# Patient Record
Sex: Male | Born: 1955 | Hispanic: No | Marital: Married | Smoking: Current every day smoker
Health system: Southern US, Community
[De-identification: ages and names within clinical notes are randomized; demographics above are authoritative.]

## PROBLEM LIST (undated history)

## (undated) DIAGNOSIS — I739 Peripheral vascular disease, unspecified: Secondary | ICD-10-CM

## (undated) DIAGNOSIS — Z9981 Dependence on supplemental oxygen: Secondary | ICD-10-CM

## (undated) DIAGNOSIS — R0600 Dyspnea, unspecified: Secondary | ICD-10-CM

## (undated) DIAGNOSIS — J449 Chronic obstructive pulmonary disease, unspecified: Secondary | ICD-10-CM

## (undated) DIAGNOSIS — J939 Pneumothorax, unspecified: Secondary | ICD-10-CM

## (undated) DIAGNOSIS — R634 Abnormal weight loss: Secondary | ICD-10-CM

## (undated) DIAGNOSIS — C169 Malignant neoplasm of stomach, unspecified: Secondary | ICD-10-CM

## (undated) DIAGNOSIS — Z720 Tobacco use: Secondary | ICD-10-CM

## (undated) HISTORY — PX: ESOPHAGOGASTRODUODENOSCOPY: SHX1529

## (undated) HISTORY — DX: Malignant neoplasm of stomach, unspecified: C16.9

## (undated) HISTORY — DX: Pneumothorax, unspecified: J93.9

## (undated) NOTE — *Deleted (*Deleted)
[facility]   [phone] [phone]   Clinic Follow up Note   Patient Care Team: Malachy Mood, MD as PCP - General (Hematology) Michele Mcalpine, MD as Consulting Physician (Pulmonary Disease) Rachael Fee, MD as Attending Physician (Gastroenterology) Almond Lint, MD as Consulting Physician (General Surgery) Tomma Lightning, MD as Consulting Physician (Pulmonary Disease)  Date of Service:  05/28/2020  CHIEF COMPLAINT: F/u on gastric adenocarcinoma  SUMMARY OF ONCOLOGIC HISTORY: Oncology History Overview Note  Cancer Staging Gastric cancer [facility]) Staging form: Stomach, AJCC 8th Edition - Clinical stage from 05/24/2017: Stage I (cT2, cN0, cM0) - Signed by Malachy Mood, MD on 06/19/2017 - Pathologic stage from 11/27/2017: Stage I (ypT1b, pN0, cM0) - Signed by Malachy Mood, MD on 04/04/2018     Gastric adenocarcinoma (HCC)  05/24/2017 Procedure   EGD by Dr. Christella Hartigan 05/24/17 IMPRESSION Ulcerated mass like region in the distal stomach (4cm across), extensively biopsied. This appears neoplastic. - Abnormal mucosa throughout stomach otherwise (mild-moderate gastritis), also extensively biopsied.   05/24/2017 Initial Biopsy   Diagnosis 05/24/17  1. Stomach, biopsy, mass distal - ADENOCARCINOMA, SEE COMMENT. - CHRONIC ACTIVE GASTRITIS WITH INTESTINAL METAPLASIA. 2. 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 - NO DYSPLASIA OR MALIGNANCY   06/04/2017 Initial Diagnosis   Gastric cancer (HCC)   06/07/2017 Procedure   EUS by Dr. Christella Hartigan on 06/07/17  IMPRESSION:  5cm across, partially circumferential uT2N0 gastric adenocarcinoma laying in the distal stomach 3-4cm proximal to the pylorus.   06/22/2017 Imaging   CT CAP W Contrast 06/22/17  IMPRESSION: 1. The gastric mass is somewhat inconspicuous on CT, but a potential 1.2 cm thick mucosal masses seen along the posterior  gastric border on image 83/2. There several small adjacent right gastric lymph nodes measuring up to 0.7 cm in short axis, but no overtly pathologic adenopathy and no findings of hepatic or peritoneal metastatic disease at this time. 2. Calcified granulomas in both lungs along with several noncalcified nodules stable from August 2016 and likely benign given this long-term stability. 3. Other imaging findings of potential clinical significance: Aortic Atherosclerosis (ICD10-I70.0) and Emphysema (ICD10-J43.9). Airway thickening is present, suggesting bronchitis or reactive airways disease. 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 Disc bulges at L3-4 and L4-5.   07/05/2017 Surgery   PAC placement    07/07/2017 - 09/06/2017 Chemotherapy   Neoadjuvant FOLFOX with with leucovorin every 2 weeks. Plan for 6 cycles but stopped after 4 cycles due to poor tolerance     09/10/2017 Imaging    CT AP W Contrast 09/10/17 IMPRESSION: 1. No acute findings identified within the abdomen or pelvis. 2. Interval development of multiple bilateral pulmonary densities within both lower lobes. These are indeterminate, likely post infectious or inflammatory in etiology. Atypical infection not excluded. Metastatic disease is considered less favored but not entirely excluded and follow-up imaging to ensure resolution is advised. 3. No evidence to suggest recurrent tumor or metastatic disease within the abdomen or pelvis. 4.  Aortic Atherosclerosis (ICD10-I70.0).   11/27/2017 Surgery    Surgery He had partial gastrectomy with Dr. Donell Beers on 11/27/2017.   11/27/2017 Pathology Results   11/27/2017 Surgical Pathology Diagnosis 1. Stomach, resection for tumor, Distal w/ Omentum - ADENOCARCINOMA, MODERATELY DIFFERENTIATED, 3.8 CM - LYMPHOVASCULAR SPACE AND PERINEURAL INVASION - NO CARCINOMA IDENTIFIED IN FOUR LYMPH NODES (0/4) - SEE  ONCOLOGY TABLE AND COMMENT BELOW 2.  Lymph node, biopsy, Portal - NO CARCINOMA IDENTIFIED IN ONE LYMPH NODE (0/1) - SEE COMMENT 3. Lymph node, biopsy, Left Gastric - NO CARCINOMA IDENTIFIED IN TWO LYMPH NODES (0/2) - SEE COMMENT 4. Omentum, resection for tumor - BENIGN FIBROADIPOSE TISSUE - NO CARCINOMA IDENTIFIED    11/27/2017 Cancer Staging   Staging form: Stomach, AJCC 8th Edition - Pathologic stage from 11/27/2017: Stage I (ypT1b, pN0, cM0) - Signed by Malachy Mood, MD on 04/04/2018   08/14/2018 Imaging   CT CAP 08/14/18 IMPRESSION:  1. No findings of active malignancy. 2. Generally stable pulmonary nodules are identified in the lungs. The only new nodule seen today is a 3 mm right apical nodule on image 32/4 which may merit surveillance. Many of the basilar nodules shown on the prior CT abdomen from 09/10/2017 have resolved. 3. Other imaging findings of potential clinical significance: Aortic Atherosclerosis (ICD10-I70.0). Emphysema (ICD10-J43.9). Lumbar degenerative disc disease.   08/22/2018 Procedure   Upper endoscopy 08/22/18 by Dr Fritz Pickerel The esophagus was slightly tortuous but there were no strictures/stenosis and the mucosa was normal throughout. The gastro-jejunostomy site was widely patent, Bilroth II anatomy. There was a 1.5-2cm region of the anastomosis that was discretely abnormal, soft but somewhat neoplastic appearing and nodular. I biopsied this region extensively. Diagnosis Stomach, biopsy, GJ anastomosis erythema - LOW GRADE GLANDULAR DYSPLASIA. - GOBLET CELL METAPLASIA. - SEE COMMENT.   11/28/2018 Procedure   Upper Endoscopy 11/28/18 by Dr Fritz Pickerel - Bilroth II anatomy. The previously noted adenomatous polyp was again located at the GJ anastomosis. It was soft, sessile, about 1.9cm across in greatest dimension. I used mixed cold/cautery snare resection to (apparently) completely remove the polyp. Diagnosis Stomach, polyp(s) - LOW GRADE  GLANDULAR DYSPLASIA. - GOBLET CELL METAPLASIA.   07/31/2019 Procedure   Upper Endoscopy by Dr. Christella Hartigan 07/31/19  IMPRESSION - Typical Bilroth II anatomy. The GJ anastomosis was slightly inflammed, but not overtly neoplastic appearing. No clear recurrent or residual polyps. The anastomosis was sampled at the sight of most significant appearing inflammation (1-2cm section, see images). - Afferent and efferent limbs were intubated briefly and appeared normal. - Mild candida esophagitis, not sampled. Will start empiric diflucan course (100mg  daily for 10 days) - The examination was otherwise normal. FINAL MICROSCOPIC DIAGNOSIS:   A.  IRREGULAR MUCOSA AT GJ JUNCTION, BIOPSY:  -Anastomosis site with peptic change and low grade dysplasia.    04/01/2020 Imaging   CT CAP IMPRESSION: 1. Redemonstrated postoperative findings of distal gastrectomy and gastric jejunostomy. No evidence of malignant recurrence. 2. There is incidental note of intussusception of the jejunum into the gastric lumen, likely transient and peristaltic in nature. 3. Stable small pulmonary nodules scattered throughout the lungs. Attention on follow-up. 4. Additional scattered, clustered centrilobular and tree-in-bud nodules in the dependent right lower lobe, most consistent with aspiration given the presence of frothy debris in the airways. 5. Severe emphysema and diffuse bilateral bronchial wall thickening. Emphysema (ICD10-J43.9). 6. Coronary artery disease. 7. 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 Aortic Atherosclerosis (ICD10-I70.0).      CURRENT THERAPY:  Surveillance  INTERVAL HISTORY: *** Bobby Caldwell is here for a follow up of gastric cancer. He presents to the clinic alone.    REVIEW OF SYSTEMS:  *** Constitutional: Denies fevers, chills or abnormal weight loss Eyes: Denies  blurriness of vision Ears, nose, mouth, throat, and face: Denies mucositis or  sore throat Respiratory: Denies cough, dyspnea or wheezes Cardiovascular: Denies palpitation, chest discomfort or lower extremity swelling Gastrointestinal:  Denies nausea, heartburn or change in bowel habits Skin: Denies abnormal skin rashes Lymphatics: Denies new lymphadenopathy or easy bruising Neurological:Denies numbness, tingling or new weaknesses Behavioral/Psych: Mood is stable, no new changes  All other systems were reviewed with the patient and are negative.  MEDICAL HISTORY:  Past Medical History:  Diagnosis Date  . COPD (chronic obstructive pulmonary disease) (HCC)   . Dyspnea   . PAD (peripheral artery disease) (HCC)   . Stomach cancer (HCC)   . Tobacco abuse   . Weight loss     SURGICAL HISTORY: Past Surgical History:  Procedure Laterality Date  . ABDOMINAL AORTOGRAM W/LOWER EXTREMITY N/A 05/03/2017   Procedure: ABDOMINAL AORTOGRAM W/LOWER EXTREMITY;  Surgeon: Fransisco Hertz, MD;  Location: [facility] INVASIVE CV LAB;  Service: Cardiovascular;  Laterality: N/A;  . BIOPSY  08/22/2018   Procedure: BIOPSY;  Surgeon: Rachael Fee, MD;  Location: [facility] ENDOSCOPY;  Service: Endoscopy;;  . BIOPSY  07/31/2019   Procedure: BIOPSY;  Surgeon: Rachael Fee, MD;  Location: [facility] ENDOSCOPY;  Service: Endoscopy;;  . ESOPHAGOGASTRODUODENOSCOPY     05/24/17  . ESOPHAGOGASTRODUODENOSCOPY (EGD) WITH PROPOFOL N/A 05/24/2017   Procedure: ESOPHAGOGASTRODUODENOSCOPY (EGD) WITH PROPOFOL;  Surgeon: Rachael Fee, MD;  Location: [facility]OSCOPY;  Service: Endoscopy;  Laterality: N/A;  . ESOPHAGOGASTRODUODENOSCOPY (EGD) WITH PROPOFOL N/A 08/22/2018   Procedure: ESOPHAGOGASTRODUODENOSCOPY (EGD) WITH PROPOFOL;  Surgeon: Rachael Fee, MD;  Location: [facility]OSCOPY;  Service: Endoscopy;  Laterality: N/A;  . ESOPHAGOGASTRODUODENOSCOPY (EGD) WITH PROPOFOL N/A 11/28/2018   Procedure: ESOPHAGOGASTRODUODENOSCOPY (EGD) WITH PROPOFOL;   Surgeon: Rachael Fee, MD;  Location: [facility];  Service: Endoscopy;  Laterality: N/A;  . ESOPHAGOGASTRODUODENOSCOPY (EGD) WITH PROPOFOL N/A 07/31/2019   Procedure: ESOPHAGOGASTRODUODENOSCOPY (EGD) WITH PROPOFOL;  Surgeon: Rachael Fee, MD;  Location: [facility];  Service: Endoscopy;  Laterality: N/A;  . EUS N/A 06/07/2017   Procedure: UPPER ENDOSCOPIC ULTRASOUND (EUS) RADIAL;  Surgeon: Rachael Fee, MD;  Location: [facility]OS[facility];  Service: Endoscopy;  Laterality: N/A;  . FEMORAL-FEMORAL BYPASS GRAFT Bilateral 05/08/2017   Procedure: LEFT TO RIGHT BYPASS GRAFT FEMORAL-FEMORAL ARTERY;  Surgeon: Fransisco Hertz, MD;  Location: [facility] OR;  Service: Vascular;  Laterality: Bilateral;  . IR FLUORO GUIDE PORT INSERTION RIGHT  07/05/2017  . IR US GUIDE VASC ACCESS RIGHT  07/05/2017  . LAPAROSCOPIC INSERTION GASTROSTOMY TUBE N/A 11/27/2017   Procedure: JEJUNOSTOMY TUBE PLACEMENT;  Surgeon: Almond Lint, MD;  Location: [facility] OR;  Service: General;  Laterality: N/A;  . LAPAROSCOPIC PARTIAL GASTRECTOMY N/A 11/27/2017   Procedure: PARTIAL GASTRECTOMY;  Surgeon: Almond Lint, MD;  Location: [facility] OR;  Service: General;  Laterality: N/A;  . LAPAROSCOPY N/A 11/27/2017   Procedure: LAPAROSCOPY DIAGNOSTIC;  Surgeon: Almond Lint, MD;  Location: [facility] OR;  Service: General;  Laterality: N/A;  . NM MYOVIEW LTD  06/2017   LOW RISK. Small basal inferoseptal defect thought to be related to artifact (although cannot exclude prior infarct). Normal wall motion i in this area would suggest artifact not infarct. Otherwise no ischemia or infarction.  Marland Kitchen PERIPHERAL VASCULAR INTERVENTION Left 05/03/2017   Procedure: PERIPHERAL VASCULAR INTERVENTION;  Surgeon: Fransisco Hertz, MD;  Location: [facility] INVASIVE CV LAB;  Service: Cardiovascular;  Laterality: Left;  common iliac  . POLYPECTOMY  11/28/2018   Procedure: POLYPECTOMY;  Surgeon: Rachael Fee, MD;  Location: [facility]COPY;  Service: Endoscopy;;  . TRANSTHORACIC ECHOCARDIOGRAM  06/26/2017   Normal LV size and function. EF 60-65%. No wall motion abnormalities. GR 1 DD. Mild LA dilation    I have reviewed the social history and family history with the patient and they are unchanged from previous note.  ALLERGIES:  has No Known Allergies.  MEDICATIONS:  Current Outpatient Medications  Medication Sig Dispense Refill  . Budeson-Glycopyrrol-Formoterol (BREZTRI AEROSPHERE) 160-9-4.8 MCG/ACT AERO Inhale 2 puffs into the lungs 2 (two) times daily. 5.9 g 0  . budesonide-formoterol (SYMBICORT) 160-4.5 MCG/ACT inhaler Inhale 2 puffs into the lungs 2 (two) times daily. 6 g 5  . feeding supplement, ENSURE ENLIVE, (ENSURE ENLIVE) LIQD Take 237 mLs by mouth 2 (two) times daily between meals. 14222 mL 0  . gabapentin (NEURONTIN) 300 MG/6ML solution Take 6 mLs (300 mg total) by mouth 2 (two) times daily. 470 mL 2  . ipratropium-albuterol (DUONEB) 0.5-2.5 (3) MG/3ML SOLN Inhale 3 mLs into the lungs 4 (four) times daily. Dx:J44.9 360 mL 5  . megestrol (MEGACE ES) 625 MG/5ML suspension Take 5 mLs (625 mg total) by mouth daily. 150 mL 0  . mirtazapine (REMERON) 7.5 MG tablet Take 1 tablet (7.5 mg total) by mouth at bedtime. 30 tablet 2  . Multiple Vitamin (MULTIVITAMIN WITH MINERALS) TABS tablet Take 1 tablet by mouth daily. 30 tablet 0  . ondansetron (ZOFRAN) 4 MG tablet Take 1 tablet (4 mg total) by mouth every 8 (eight) hours as needed for nausea or vomiting. 15 tablet 0  . oxyCODONE-acetaminophen (PERCOCET) 7.5-325 MG tablet Take 1 tablet by mouth every 6 (six) hours as needed for severe pain. 30 tablet 0  . OXYGEN Inhale 2 L into the lungs continuous.    . Tiotropium Bromide Monohydrate (SPIRIVA RESPIMAT) 2.5 MCG/ACT AERS Inhale 2 puffs into the lungs daily. 4 g 5  . traMADol (ULTRAM) 50 MG tablet Take 1 tablet (50 mg total) by mouth every 12 (twelve) hours as needed. 30 tablet 0   No current facility-administered medications for this visit.    PHYSICAL EXAMINATION: ECOG  PERFORMANCE STATUS: {CHL ONC ECOG [phone]}  There were no vitals filed for this visit. There were no vitals filed for this visit. *** GENERAL:alert, no distress and comfortable SKIN: skin color, texture, turgor are normal, no rashes or significant lesions EYES: normal, Conjunctiva are pink and non-injected, sclera clear {OROPHARYNX:no exudate, no erythema and lips, buccal mucosa, and tongue normal}  NECK: supple, thyroid normal size, non-tender, without nodularity LYMPH:  no palpable lymphadenopathy in the cervical, axillary {or inguinal} LUNGS: clear to auscultation and percussion with normal breathing effort HEART: regular rate & rhythm and no murmurs and no lower extremity edema ABDOMEN:abdomen soft, non-tender and normal bowel sounds Musculoskeletal:no cyanosis of digits and no clubbing  NEURO: alert & oriented x 3 with fluent speech, no focal motor/sensory deficits  LABORATORY DATA:  I have reviewed the data as listed CBC Latest Ref Rng & Units 03/23/2020 11/18/2019 10/15/2019  WBC 4.0 - 10.5 K/uL 7.8 7.0 7.4  Hemoglobin 13.0 - 17.0 g/dL 12.4(L) 13.7 12.3(L)  Hematocrit 39 - 52 % 37.9(L) 44.1 38.7(L)  Platelets 150 - 400 K/uL 207 252 235     CMP Latest Ref Rng & Units 03/23/2020 11/18/2019 10/15/2019  Glucose 70 - 99 mg/dL 80 83 161(W)  BUN 8 - 23 mg/dL 6(L) 5(L) 12  Creatinine 0.61 - 1.24 mg/dL 9.60(A) 5.40 9.81(X)  Sodium 135 - 145 mmol/L 139 134(L) 140  Potassium 3.5 - 5.1 mmol/L 3.6 3.9 4.3  Chloride 98 -  111 mmol/L 98 93(L) 94(L)  CO2 22 - 32 mmol/L 34(H) 33(H) 39(H)  Calcium 8.9 - 10.3 mg/dL 1.6(X) 0.9(U) 8.1(L)  Total Protein 6.5 - 8.1 g/dL 6.0(L) - -  Total Bilirubin 0.3 - 1.2 mg/dL 0.5 - -  Alkaline Phos 38 - 126 U/L 80 - -  AST 15 - 41 U/L 13(L) - -  ALT 0 - 44 U/L 11 - -      RADIOGRAPHIC STUDIES: I have personally reviewed the radiological images as listed and agreed with the findings in the report. No results found.   ASSESSMENT & PLAN:  Bobby Caldwell  is a 72 y.o. male with   1. Gastric Adenocarcinoma, uT2N0M0, stage I, ypT1bN0 -Diagnosed in 05/2017. Treated withneoadjuvantchemo and surgery. Currently on surveillance. -His Upper Endoscopy with Dr Christella Hartigan from 07/31/19 showsno evidence of recurrence, mild esophagitis, which was treated with fluconazole. -His last CT CAP form 2/8/21showed no evidence of recurrence. -He has developed upper abdominal pain, lost more than 10 pounds, very fatigued, not doing well clinically.  I will repeat CT chest, abdomen pelvis in the next few weeks to ruled out cancer recurrence -Lab reviewed, CBC and CMP unremarkable -Follow-up after CT scan   2. COPD, Smoking Cessation -I previously advised him to quit smoking. -he will f/u pulmonologist Dr. Wynona Neat tomorrow -he needs portable oxygen tank, my nurse will check with his oxygen supplier   3.Depression and or anxiety -Currently on Ativan as needed. Continue and monitor.  4.Low appetite and weight loss -Repeat endoscopies in February a and May 2020 show benign polyp at [facility] anastamosis. -He used megace, mirtazapine and Wellbutrin. Heresponds better tomegace(he uses as needed), he is not on any of these now -Due to his significant weight loss, insomnia, I will restart mirtazapine first.  5. COVID19 (+)  -He tested positive for COVID19 on 06/23/19.He was quite symptomatic, but did not require hospitalization. -recovered now  Plan -I called in mirtazapine 7.5 mg hs for him. refilled tramadol and zofran  -CT chest, abdomen pelvis with contrast in the next 1 to 2 weeks -Follow-up after CT scan.   No problem-specific Assessment & Plan notes found for this encounter.   No orders of the defined types were placed in this encounter.  All questions were answered. The patient knows to call the clinic with any problems, questions or concerns. No barriers to learning was detected. The total time spent in the appointment was {CHL ONC TIME  VISIT - EAVWU:[id number]}.     Delphina Cahill 05/28/2020   Rogelia Rohrer, am acting as scribe for Malachy Mood, MD.   {Add scribe attestation statement}

---

## 2000-05-24 ENCOUNTER — Encounter: Payer: Self-pay | Admitting: Emergency Medicine

## 2000-05-24 ENCOUNTER — Emergency Department (HOSPITAL_COMMUNITY): Admission: EM | Admit: 2000-05-24 | Discharge: 2000-05-24 | Payer: Self-pay | Admitting: Emergency Medicine

## 2000-05-26 ENCOUNTER — Emergency Department (HOSPITAL_COMMUNITY): Admission: EM | Admit: 2000-05-26 | Discharge: 2000-05-26 | Payer: Self-pay | Admitting: Emergency Medicine

## 2000-05-26 ENCOUNTER — Encounter: Payer: Self-pay | Admitting: Emergency Medicine

## 2015-01-15 ENCOUNTER — Other Ambulatory Visit: Payer: Self-pay | Admitting: Nurse Practitioner

## 2015-01-15 ENCOUNTER — Ambulatory Visit
Admission: RE | Admit: 2015-01-15 | Discharge: 2015-01-15 | Disposition: A | Discharge status: Home / Self Care | Payer: BLUE CROSS/BLUE SHIELD | Source: Ambulatory Visit | Attending: Internal Medicine | Admitting: Internal Medicine

## 2015-01-15 DIAGNOSIS — R079 Chest pain, unspecified: Secondary | ICD-10-CM

## 2015-02-12 ENCOUNTER — Institutional Professional Consult (permissible substitution): Payer: BLUE CROSS/BLUE SHIELD | Admitting: Pulmonary Disease

## 2015-02-16 ENCOUNTER — Ambulatory Visit (INDEPENDENT_AMBULATORY_CARE_PROVIDER_SITE_OTHER): Payer: BLUE CROSS/BLUE SHIELD | Admitting: Pulmonary Disease

## 2015-02-16 ENCOUNTER — Encounter: Payer: Self-pay | Admitting: Pulmonary Disease

## 2015-02-16 VITALS — BP 140/70 | HR 90 | Temp 98.0°F | Ht 67.0 in | Wt 112.6 lb

## 2015-02-16 DIAGNOSIS — Z720 Tobacco use: Secondary | ICD-10-CM | POA: Diagnosis not present

## 2015-02-16 DIAGNOSIS — F17210 Nicotine dependence, cigarettes, uncomplicated: Secondary | ICD-10-CM | POA: Insufficient documentation

## 2015-02-16 DIAGNOSIS — R0600 Dyspnea, unspecified: Secondary | ICD-10-CM | POA: Diagnosis not present

## 2015-02-16 DIAGNOSIS — R634 Abnormal weight loss: Secondary | ICD-10-CM | POA: Diagnosis not present

## 2015-02-16 DIAGNOSIS — J439 Emphysema, unspecified: Secondary | ICD-10-CM

## 2015-02-16 MED ORDER — TIOTROPIUM BROMIDE MONOHYDRATE 2.5 MCG/ACT IN AERS: 2.0000 | INHALATION_SPRAY | Freq: Every day | RESPIRATORY_TRACT | Status: DC

## 2015-02-16 MED ORDER — ALBUTEROL SULFATE 108 (90 BASE) MCG/ACT IN AEPB: 2.0000 | INHALATION_SPRAY | Freq: Four times a day (QID) | RESPIRATORY_TRACT | Status: DC | PRN

## 2015-02-16 MED ORDER — BUDESONIDE-FORMOTEROL FUMARATE 160-4.5 MCG/ACT IN AERO: 2.0000 | INHALATION_SPRAY | Freq: Two times a day (BID) | RESPIRATORY_TRACT | Status: DC

## 2015-02-16 NOTE — Patient Instructions (Signed)
Bobby Caldwell-  It was a pleasure meeting you today...  As we discussed- you have severe emphysema and you must quit the smoking!!!    It is ok to wean down off the cigarettes but you must cut back & keep on cutting back until you have quit completely!!!  For further evaluation of your lung disease & weight loss => we are going to check a CT Scan of your lungs...    We will contact you w/ the results when available...   For treatment we are recommending the following inhalers>>     SYMBICORT160- 2 sprays twice daily every day...    SPIRIVA Respimat- 2 sprays once daily every day...    PROAIR-HFA rescue inhaler-  1-2 sprays every 6H AS NEEDED for wheezing & shortness of breath...  Call for any questions...  Let's plan a follow up visit in 6-8 weeks, sooner if needed for problems.Marland KitchenMarland Kitchen

## 2015-02-16 NOTE — Progress Notes (Addendum)
Subjective:     Patient ID: Bobby Caldwell, male   DOB: 01/15/1956, 59 y.o.   MRN: [id number]  HPI 59 y/o gentleman from [place], referred by Bobby Caldwell at [facility] (Bobby Caldwell), for pulmonary evaluation due to emphysema>      Bobby Caldwell has been a heavy smoker- started at age 70 y/o when they would "roll your own" & smoked continually up to 2ppd for ~69yrs before decr to 1ppd more recently, est ~70-80 pack-yr smoking hx... He c/o SOB/ DOE, notes mild cough, sm amt beige sput, no hemptysis, occas sharp CP & palpit, no edema...  He denies signif hx of active lung problems- w/o recurrent bronchitic infections, pneumonia, Tb or known exposure, asthma, etc;  Neither has he had any hx signif cardiac problems- w/o known CAD, CHF, etc... He denies occupational exposures to asbestos, silica, mining, etc...      Bobby Caldwell also notes weight loss- states that his regular weight is ~150# & he currently weighs 113#, 5'7" Tall, BMI=17-18; appetite is poor & is rec to add nutritional supplements Bid betw meals...      Current Meds> NONE- he works at [employer] medications are too $$ for him...  EXAM showed Afeb, VSS, O2sat=97% on RA;  HEENT- neg, mallampati1;  Chest- decr BS bilat w/ sl end-exp wheezing;  Heart- RR w/o m/r/g;  Abd- soft, neg;  Ext- w/o c/c/e;  Neuro- no focal neuro deficits...  CXR 01/15/15 showed hyperinflation, emphysema, NAD...  Spirometry 02/16/15 showed FVC=2.66 (61%), FEV1=1.25 (36%), %1sec=47, mid-flows reduced at 19% predicted... This is c/w severe airflow obstruction and GOLD Stage 3 COPD.  Ambulatory oxygen saturation test 02/16/15 showed O2sat=98% on RA at rest w/ pulse=86/min; he ambulated 3 laps w/ lowest O2sat=98% w/ HR=111/min...  LABS 01/2015 from [place] are wnl & scanned into EPIC...   CT Chest w/o contrast> pending (see below)  IMP/PLAN>>  Bobby Caldwell has been a life long smoker & has paid a price for this behavior w/ severe emphysema; despite still  working at [employer] w/ 4 daughters to support- he continues to smoke 1ppd @$5 per pack= #150/mo & I have appealed to his parental instincts to quit smoking completely but he is not optimistic that he will be able to do so & unwilling to purchase smoking cessation aides like Chantix, Nicotine replacement, etc... In addition to smoking cessation we have prescribed SYMBICORT160- 2spBid, & SPIRIVA daily, along w/ ProairHFA rescue inhaler for prn use (coupons given taking advantage of all known discount programs)... For completeness I have rec a CT Chest (esp in light of his wt loss) and his PCP may want to consider CT Abd & Pelvis as well... He is rec to start eating better, 3 meals/day, plus nutritional supplements Bid;  Finally he needs the full benefits for avail immunization programs=> Prevnar-13, Pneumovax-23, Flu shots, etc (we will leave this to his PCP to administer)... Rec ROV recheck in 6 weeks.  ADDENDUM>>  CT Chest 02/22/15 showed severe centrilobular emphysema, no adenopathy, several small pulm nodules- largest is 18mm in LUL & RML... rec f/u CT in 55mo.    No past medical history on file.  Limited english hampers medical history> notes from PCP reviewed...   No past surgical history on file.   Outpatient Encounter Prescriptions as of 02/16/2015  Medication Sig  . Albuterol Sulfate (PROAIR RESPICLICK) 979 (90 BASE) MCG/ACT AEPB Inhale 2 puffs into the lungs every 6 (six) hours as needed.  . budesonide-formoterol (SYMBICORT)  160-4.5 MCG/ACT inhaler Inhale 2 puffs into the lungs 2 (two) times daily.  . Tiotropium Bromide Monohydrate (SPIRIVA RESPIMAT) 2.5 MCG/ACT AERS Inhale 2 puffs into the lungs daily.   No facility-administered encounter medications on file as of 02/16/2015.    No Known Allergies   No family history on file.  Records indicate that he is married w/ 4 daughters   History   Social History  . Marital Status: Single    Spouse Name: N/A  . Number of Children: N/A  .  Years of Education: N/A   Occupational History  . Not on file.   Social History Main Topics  . Smoking status: Current Every Day Smoker -- 1.00 packs/day for 50 years    Types: Cigarettes  . Smokeless tobacco: Not on file  . Alcohol Use: Not on file  . Drug Use: Not on file  . Sexual Activity: Not on file   Other Topics Concern  . Not on file   Social History Narrative  . No narrative on file    Current Medications, Allergies, Past Medical History, Past Surgical History, Family History, and Social History were reviewed in [employer] record.   Review of Systems            All symptoms NEG except where BOLDED >>  Constitutional:  F/C/S, fatigue, anorexia, unexpected weight change. HEENT:  HA, visual changes, hearing loss, earache, nasal symptoms, sore throat, mouth sores, hoarseness. Resp:  cough, sputum, hemoptysis; SOB, tightness, wheezing. Cardio:  CP, palpit, DOE, orthopnea, edema. GI:  N/V/D/C, blood in stool; reflux, abd pain, distention, gas. GU:  dysuria, freq, urgency, hematuria, flank pain, voiding difficulty. MS:  joint pain, swelling, tenderness, decr ROM; neck pain, back pain, etc. Neuro:  HA, tremors, seizures, dizziness, syncope, weakness, numbness, gait abn. Skin:  suspicious lesions or skin rash. Heme:  adenopathy, bruising, bleeding. Psyche:  confusion, agitation, sleep disturbance, hallucinations, anxiety, depression suicidal.   Objective:   Physical Exam      Vital Signs:  Reviewed...  General:  WD, cachectic, 59 y/o WM in NAD; alert & oriented; pleasant & cooperative... HEENT:  /AT; Conjunctiva- pink, Sclera- nonicteric, EOM-wnl, PERRLA, EACs-clear, TMs-wnl; NOSE-clear; THROAT-clear & wnl. Neck:  Supple w/ fair ROM; no JVD; normal carotid impulses w/o bruits; no thyromegaly or nodules palpated; no lymphadenopathy. Chest:  Decr BS bilat, unable to augment BS voluntarily, clear without wheezes, rales, or rhonchi heard. Heart:   Epig PMI, Regular Rhythm; norm S1 & S2 without murmurs, rubs, or gallops detected. Abdomen:  Thin, soft & nontender- no guarding or rebound; normal bowel sounds; no organomegaly or masses palpated. Ext:  Normal ROM; without deformities or arthritic changes; no varicose veins, venous insuffic, or edema;  Pulses intact w/o bruits. Neuro:  No focal neuro deficits; sensory testing normal; gait normal & balance OK. Derm:  No lesions noted; no rash etc. Lymph:  No cervical, supraclavicular, axillary, or inguinal adenopathy palpated.   Assessment:      IMP >>     Severe COPD/ Emphysema    Cigarette smoker    Weight loss   PLAN >>  Bobby Caldwell has been a life long smoker & has paid a price for this behavior w/ severe emphysema; despite still working at [employer] w/ 4 daughters to support- he continues to smoke 1ppd @$5 per pack= #150/mo & I have appealed to his parental instincts to quit smoking completely but he is not optimistic that he will be able to do so &  unwilling to purchase smoking cessation aides like Chantix, Nicotine replacement, etc... In addition to smoking cessation we have prescribed SYMBICORT160- 2spBid, & SPIRIVA daily, along w/ ProairHFA rescue inhaler for prn use (coupons given taking advantage of all known discount programs)... For completeness I have rec a CT Chest (esp in light of his wt loss) and his PCP may want to consider CT Abd & Pelvis as well... He is rec to start eating better, 3 meals/day, plus nutritional supplements Bid;  Finally he needs the full benefits for avail immunization programs=> Prevnar-13, Pneumovax-23, Flu shots, etc (we will leave this to his PCP to administer)... Rec ROV recheck in 6 weeks.      Plan:     Patient's Medications  New Prescriptions   ALBUTEROL SULFATE (PROAIR RESPICLICK) 902 (90 BASE) MCG/ACT AEPB    Inhale 2 puffs into the lungs every 6 (six) hours as needed.   BUDESONIDE-FORMOTEROL (SYMBICORT) 160-4.5 MCG/ACT INHALER    Inhale 2 puffs  into the lungs 2 (two) times daily.   TIOTROPIUM BROMIDE MONOHYDRATE (SPIRIVA RESPIMAT) 2.5 MCG/ACT AERS    Inhale 2 puffs into the lungs daily.  Previous Medications   No medications on file  Modified Medications   No medications on file  Discontinued Medications   No medications on file

## 2015-02-22 ENCOUNTER — Ambulatory Visit (INDEPENDENT_AMBULATORY_CARE_PROVIDER_SITE_OTHER)
Admission: RE | Admit: 2015-02-22 | Discharge: 2015-02-22 | Disposition: A | Discharge status: Home / Self Care | Payer: BLUE CROSS/BLUE SHIELD | Source: Ambulatory Visit | Attending: Pulmonary Disease | Admitting: Pulmonary Disease

## 2015-02-22 DIAGNOSIS — J439 Emphysema, unspecified: Secondary | ICD-10-CM

## 2015-02-22 DIAGNOSIS — R634 Abnormal weight loss: Secondary | ICD-10-CM | POA: Diagnosis not present

## 2015-02-23 ENCOUNTER — Encounter: Payer: Self-pay | Admitting: Pulmonary Disease

## 2015-03-02 ENCOUNTER — Telehealth: Payer: Self-pay | Admitting: Pulmonary Disease

## 2015-03-02 NOTE — Telephone Encounter (Signed)
Patient came by office to get CT results. Patient also wanted me to show him the proper usage of his inhalers and to write on his medication list how he is to take the medications.  ADDENDUM>> CT Chest 02/22/15 showed severe centrilobular emphysema, no adenopathy, several small pulm nodules- largest is 55mm in LUL & RML... rec f/u CT in 29mo.  Patient notified of results.  Discussed proper use of inhalers Documented how patient should take his inhalers Nothing further needed.

## 2015-03-30 ENCOUNTER — Ambulatory Visit (INDEPENDENT_AMBULATORY_CARE_PROVIDER_SITE_OTHER): Payer: BLUE CROSS/BLUE SHIELD | Admitting: Pulmonary Disease

## 2015-03-30 ENCOUNTER — Encounter: Payer: Self-pay | Admitting: Pulmonary Disease

## 2015-03-30 VITALS — BP 108/60 | HR 95 | Temp 97.8°F | Wt 115.8 lb

## 2015-03-30 DIAGNOSIS — F17210 Nicotine dependence, cigarettes, uncomplicated: Secondary | ICD-10-CM

## 2015-03-30 DIAGNOSIS — Z720 Tobacco use: Secondary | ICD-10-CM

## 2015-03-30 DIAGNOSIS — J432 Centrilobular emphysema: Secondary | ICD-10-CM | POA: Diagnosis not present

## 2015-03-30 DIAGNOSIS — R636 Underweight: Secondary | ICD-10-CM

## 2015-03-30 NOTE — Progress Notes (Signed)
Subjective:     Patient ID: Bobby Caldwell, male   DOB: August 11, 1955, 59 y.o.   MRN: [id number]  HPI ~  February 16, 2015:  Initial pulmonary consult by SN>   59 y/o gentleman from [place], referred by Minette Brine NP at [facility] (Dr. Glendale Chard), for pulmonary evaluation due to emphysema>      Bobby Caldwell has been a heavy smoker- started at age 59 y/o when they would "roll your own" & smoked continually up to 2ppd for ~61yrs before decr to 1ppd more recently, est ~70-80 pack-yr smoking hx... He c/o SOB/ DOE, notes mild cough, sm amt beige sput, no hemptysis, occas sharp CP & palpit, no edema...  He denies signif hx of active lung problems- w/o recurrent bronchitic infections, pneumonia, Tb or known exposure, asthma, etc;  Neither has he had any hx signif cardiac problems- w/o known CAD, CHF, etc... He denies occupational exposures to asbestos, silica, mining, etc...      Bobby Caldwell also notes weight loss- states that his regular weight is ~150# & he currently weighs 113#, 5'7" Tall, BMI=17-18; appetite is poor & is rec to add nutritional supplements Bid betw meals...      Current Meds> NONE- he works at [employer] medications are too $$ for him... EXAM showed Afeb, VSS, O2sat=97% on RA;  HEENT- neg, mallampati1;  Chest- decr BS bilat w/ sl end-exp wheezing;  Heart- RR w/o m/r/g;  Abd- soft, neg;  Ext- w/o c/c/e;  Neuro- no focal neuro deficits...  CXR 01/15/15 showed hyperinflation, emphysema, NAD...  Spirometry 02/16/15 showed FVC=2.66 (61%), FEV1=1.25 (36%), %1sec=47, mid-flows reduced at 19% predicted... This is c/w severe airflow obstruction and GOLD Stage 3 COPD.  Ambulatory oxygen saturation test 02/16/15 showed O2sat=98% on RA at rest w/ pulse=86/min; he ambulated 3 laps w/ lowest O2sat=98% w/ HR=111/min...  LABS 01/2015 from [place] are wnl & scanned into EPIC...   CT Chest w/o contrast> pending (see below) IMP/PLAN>>  Bobby Caldwell has been a life long smoker & has paid a price for  this behavior w/ severe emphysema; despite still working at [employer] w/ 4 daughters to support- he continues to smoke 1ppd @$5 per pack= #150/mo & I have appealed to his parental instincts to quit smoking completely but he is not optimistic that he will be able to do so & unwilling to purchase smoking cessation aides like Chantix, Nicotine replacement, etc... In addition to smoking cessation we have prescribed SYMBICORT160- 2spBid, & SPIRIVA daily, along w/ ProairHFA rescue inhaler for prn use (coupons given taking advantage of all known discount programs)... For completeness I have rec a CT Chest (esp in light of his wt loss) and his PCP may want to consider CT Abd & Pelvis as well... He is rec to start eating better, 3 meals/day, plus nutritional supplements Bid;  Finally he needs the full benefits for avail immunization programs=> Prevnar-13, Pneumovax-23, Flu shots, etc (we will leave this to his PCP to administer)... Rec ROV recheck in 6 weeks.  ADDENDUM>>  CT Chest 02/22/15 showed severe centrilobular emphysema, no adenopathy, several small pulm nodules- largest is 39mm in LUL & RML... rec f/u CT in 107mo.   ~  March 30, 2015:  6wk ROV w/ SN>        Bobby Caldwell reports that he is improved on the Symbicort160-2spBid, Spiriva daily, and the AlbutHFA prn (hasn't needed);  He notes that he's decr the smoking to 7-10 cig/d now & we discussed the need to  quit completely;  He notes min cough, no sputum or hemoptysis, stable SOB/DOE, no CP/ palpit/ or edema...  EXAM showed Afeb, VSS, O2sat=95% on RA;  HEENT- neg, mallampati1;  Chest- decr BS bilat w/ sl end-exp wheezing;  Heart- RR w/o m/r/g;  Abd- soft, neg;  Ext- w/o c/c/e;  Neuro- no focal neuro deficits... IMP/PLAN>>  Bobby Caldwell is improved on the inhalers and we provided him w/ some samples and Rx but it is unclear if he will be able to afford them- discount cards & drug company programs have been provided... We plan ROV 3-4 months...    No past medical  history on file.  Limited english hampers medical history> notes from PCP reviewed...   No past surgical history on file.    Outpatient Encounter Prescriptions as of 03/30/2015  Medication Sig  . Albuterol Sulfate (PROAIR RESPICLICK) 106 (90 BASE) MCG/ACT AEPB Inhale 2 puffs into the lungs every 6 (six) hours as needed.  . budesonide-formoterol (SYMBICORT) 160-4.5 MCG/ACT inhaler Inhale 2 puffs into the lungs 2 (two) times daily.  . Tiotropium Bromide Monohydrate (SPIRIVA RESPIMAT) 2.5 MCG/ACT AERS Inhale 2 puffs into the lungs daily. (Patient not taking: Reported on 03/30/2015)   No facility-administered encounter medications on file as of 03/30/2015.    No Known Allergies   Current Medications, Allergies, Past Medical History, Past Surgical History, Family History, and Social History were reviewed in [employer] record.   Review of Systems            All symptoms NEG except where BOLDED >>  Constitutional:  F/C/S, fatigue, anorexia, unexpected weight change. HEENT:  HA, visual changes, hearing loss, earache, nasal symptoms, sore throat, mouth sores, hoarseness. Resp:  cough, sputum, hemoptysis; SOB, tightness, wheezing. Cardio:  CP, palpit, DOE, orthopnea, edema. GI:  N/V/D/C, blood in stool; reflux, abd pain, distention, gas. GU:  dysuria, freq, urgency, hematuria, flank pain, voiding difficulty. MS:  joint pain, swelling, tenderness, decr ROM; neck pain, back pain, etc. Neuro:  HA, tremors, seizures, dizziness, syncope, weakness, numbness, gait abn. Skin:  suspicious lesions or skin rash. Heme:  adenopathy, bruising, bleeding. Psyche:  confusion, agitation, sleep disturbance, hallucinations, anxiety, depression suicidal.   Objective:   Physical Exam      Vital Signs:  Reviewed...  General:  WD, cachectic, 59 y/o WM in NAD; alert & oriented; pleasant & cooperative... HEENT:  [place]/AT; Conjunctiva- pink, Sclera- nonicteric, EOM-wnl, PERRLA, EACs-clear,  TMs-wnl; NOSE-clear; THROAT-clear & wnl. Neck:  Supple w/ fair ROM; no JVD; normal carotid impulses w/o bruits; no thyromegaly or nodules palpated; no lymphadenopathy. Chest:  Decr BS bilat, unable to augment BS voluntarily, clear without wheezes, rales, or rhonchi heard. Heart:  Epig PMI, Regular Rhythm; norm S1 & S2 without murmurs, rubs, or gallops detected. Abdomen:  Thin, soft & nontender- no guarding or rebound; normal bowel sounds; no organomegaly or masses palpated. Ext:  Normal ROM; without deformities or arthritic changes; no varicose veins, venous insuffic, or edema;  Pulses intact w/o bruits. Neuro:  No focal neuro deficits; sensory testing normal; gait normal & balance OK. Derm:  No lesions noted; no rash etc. Lymph:  No cervical, supraclavicular, axillary, or inguinal adenopathy palpated.   Assessment:      IMP >>     Severe COPD/ Emphysema    Cigarette smoker    Weight loss  PLAN >>  8/9>  Bobby Caldwell has been a life long smoker & has paid a price for this behavior w/ severe emphysema; despite still working  at [employer] & w/ 4 daughters to support- he continues to smoke 1ppd @$5 per pack= #150/mo & I have appealed to his parental instincts to quit smoking completely but he is not optimistic that he will be able to do so & unwilling to purchase smoking cessation aides like Chantix, Nicotine replacement, etc... In addition to smoking cessation we have prescribed SYMBICORT160- 2spBid, & SPIRIVA daily, along w/ ProairHFA rescue inhaler for prn use (coupons given taking advantage of all known discount programs)... For completeness I have rec a CT Chest (esp in light of his wt loss) and his PCP may want to consider CT Abd & Pelvis as well... He is rec to start eating better, 3 meals/day, plus nutritional supplements Bid;  Finally he needs the full benefits for avail immunization programs=> Prevnar-13, Pneumovax-23, Flu shots, etc (we will leave this to his PCP to administer)... Rec ROV recheck  in 6 weeks. 9/20>  IMP/PLAN>>  Bobby Caldwell is improved on the inhalers and we provided him w/ some samples and Rx but it is unclear if he will be able to afford them- discount cards & drug company programs have been provided... We plan ROV 3-4 months      Plan:     Patient's Medications  New Prescriptions   No medications on file  Previous Medications   ALBUTEROL SULFATE (PROAIR RESPICLICK) 867 (90 BASE) MCG/ACT AEPB    Inhale 2 puffs into the lungs every 6 (six) hours as needed.   BUDESONIDE-FORMOTEROL (SYMBICORT) 160-4.5 MCG/ACT INHALER    Inhale 2 puffs into the lungs 2 (two) times daily.   TIOTROPIUM BROMIDE MONOHYDRATE (SPIRIVA RESPIMAT) 2.5 MCG/ACT AERS    Inhale 2 puffs into the lungs daily.  Modified Medications   No medications on file  Discontinued Medications   No medications on file

## 2015-03-30 NOTE — Patient Instructions (Addendum)
Today we updated your med list in our EPIC system...    Continue your current medications the same...  Good job on cutting back on your smoking...    Now you need to work on QUITTING!!!  Increase your nutrition & calorie intake to gain a few pounds...  Call for any questions...  Let's plan a follow up visit in 3-4 months, sooner if needed for problems.Marland KitchenMarland Kitchen

## 2015-07-30 ENCOUNTER — Ambulatory Visit: Payer: BLUE CROSS/BLUE SHIELD | Admitting: Pulmonary Disease

## 2015-08-02 ENCOUNTER — Ambulatory Visit (INDEPENDENT_AMBULATORY_CARE_PROVIDER_SITE_OTHER): Payer: BLUE CROSS/BLUE SHIELD | Admitting: Pulmonary Disease

## 2015-08-02 ENCOUNTER — Encounter: Payer: Self-pay | Admitting: Pulmonary Disease

## 2015-08-02 VITALS — BP 142/90 | HR 89 | Temp 97.7°F | Ht 67.0 in | Wt 113.8 lb

## 2015-08-02 DIAGNOSIS — R938 Abnormal findings on diagnostic imaging of other specified body structures: Secondary | ICD-10-CM

## 2015-08-02 DIAGNOSIS — R9389 Abnormal findings on diagnostic imaging of other specified body structures: Secondary | ICD-10-CM | POA: Insufficient documentation

## 2015-08-02 DIAGNOSIS — R636 Underweight: Secondary | ICD-10-CM

## 2015-08-02 DIAGNOSIS — J432 Centrilobular emphysema: Secondary | ICD-10-CM

## 2015-08-02 DIAGNOSIS — Z720 Tobacco use: Secondary | ICD-10-CM

## 2015-08-02 DIAGNOSIS — F17210 Nicotine dependence, cigarettes, uncomplicated: Secondary | ICD-10-CM

## 2015-08-02 NOTE — Patient Instructions (Signed)
Today we updated your med list in our EPIC system...    Continue your current medications the same...  It is very important for you to quit all smoking now while you still have some lung function to preserve!!!  Continue the Symbicort160- 2 sprays twice daily...  Continue the Spiriva Respimat- 2 inalations once daily...  Add OTC MUCINEX 600mg  tabs- 2 tabs twice daily w/ fluids/water...  Use the RESCUE inaler- Proair 1-2 sprays every 6H as needed for wheezing...  Stay as active as possible, continue to walk/ exercise...  Try to gain a few lbs (try Ensure, Sustacal, Boost, Carnation instant breakfast) betw meals...  Call for any questions...  Let's plan a follow up visit in 46mo, sooner if needed for problems.Marland KitchenMarland Kitchen

## 2015-08-03 ENCOUNTER — Encounter: Payer: Self-pay | Admitting: Pulmonary Disease

## 2015-08-03 NOTE — Progress Notes (Signed)
Subjective:     Patient ID: Bobby Caldwell, male   DOB: 1956/03/28, 60 y.o.   MRN: [id number]  HPI ~  February 16, 2015:  Initial pulmonary consult by SN>   51 y/o gentleman from [place], referred by Minette Brine NP at [facility] (Dr. Glendale Chard), for pulmonary evaluation due to emphysema>      [name] has been a heavy smoker- started at age 77 y/o when they would "roll your own" & smoked continually up to 2ppd for ~63yr before decr to 1ppd more recently, est ~70-80 pack-yr smoking hx... He c/o SOB/ DOE, notes mild cough, sm amt beige sput, no hemptysis, occas sharp CP & palpit, no edema...  He denies signif hx of active lung problems- w/o recurrent bronchitic infections, pneumonia, Tb or known exposure, asthma, etc;  Neither has he had any hx signif cardiac problems- w/o known CAD, CHF, etc... He denies occupational exposures to asbestos, silica, mining, etc...      [name] also notes weight loss- states that his regular weight is ~150# & he currently weighs 113#, 5'7" Tall, BMI=17-18; appetite is poor & is rec to add nutritional supplements Bid betw meals...      Current Meds> NONE- he works at [employer] are too $$ for him... EXAM showed Afeb, VSS, O2sat=97% on RA;  HEENT- neg, mallampati1;  Chest- decr BS bilat w/ sl end-exp wheezing;  Heart- RR w/o m/r/g;  Abd- soft, neg;  Ext- w/o c/c/e;  Neuro- no focal neuro deficits...  CXR 01/15/15 showed hyperinflation, emphysema, NAD...  Spirometry 02/16/15 showed FVC=2.66 (61%), FEV1=1.25 (36%), %1sec=47, mid-flows reduced at 19% predicted... This is c/w severe airflow obstruction and GOLD Stage 3 COPD.  Ambulatory oxygen saturation test 02/16/15 showed O2sat=98% on RA at rest w/ pulse=86/min; he ambulated 3 laps w/ lowest O2sat=98% w/ HR=111/min...  LABS 01/2015 from [facility] wnl & scanned into EPIC...   CT Chest w/o contrast> pending (see below) IMP/PLAN>>  [name] has been a life long smoker & has paid a price for  this behavior w/ severe emphysema; despite still working at [employer]/ 4 daughters to support- he continues to smoke 1ppd @$5 per pack= #150/mo & I have appealed to his parental instincts to quit smoking completely but he is not optimistic that he will be able to do so & unwilling to purchase smoking cessation aides like Chantix, Nicotine replacement, etc... In addition to smoking cessation we have prescribed SYMBICORT160- 2spBid, & SPIRIVA daily, along w/ ProairHFA rescue inhaler for prn use (coupons given taking advantage of all known discount programs)... For completeness I have rec a CT Chest (esp in light of his wt loss) and his PCP may want to consider CT Abd & Pelvis as well... He is rec to start eating better, 3 meals/day, plus nutritional supplements Bid;  Finally he needs the full benefits for avail immunization programs=> Prevnar-13, Pneumovax-23, Flu shots, etc (we will leave this to his PCP to administer)... Rec ROV recheck in 6 weeks. ADDENDUM>>  CT Chest 02/22/15 showed severe centrilobular emphysema, no adenopathy, several small pulm nodules- largest is 622min LUL & RML... rec f/u CT in 47m73mo~  March 30, 2015:  6wk ROV w/ SN>        Mr.Bobby Caldwell reports that he is improved on the Symbicort160-2spBid, Spiriva daily, and the AlbutHFA prn (hasn't needed);  He notes that he's decr the smoking to 7-10 cig/d now & we discussed the need to quit completely;  He notes min cough, no sputum or hemoptysis, stable SOB/DOE, no CP/ palpit/ or edema...  EXAM showed Afeb, VSS, O2sat=95% on RA;  HEENT- neg, mallampati1;  Chest- decr BS bilat w/ sl end-exp wheezing;  Heart- RR w/o m/r/g;  Abd- soft, neg;  Ext- w/o c/c/e;  Neuro- no focal neuro deficits... IMP/PLAN>>  [name] is improved on the inhalers and we provided him w/ some samples and Rx but it is unclear if he will be able to afford them- discount cards & drug company programs have been provided... We plan ROV 3-4 months...  ~ August 02, 2015: 53mo ROV  w/ SN>   Mr.Bobby Caldwell returns, unaccompanied for today's visit, states his breathing is unchanged- c/o DOE w/ min activ, cough w/ yellow sput; he denies CP, hemoptysis, edema; he is still able to perform ADLs but SOB w/ walking/ stairs/ etc; he reports that he has decreased his smoking from 2.5ppd to 1ppd at present; ?if he is really using his [place] once daily? He admits to running out of his AlbutHFA rescue inhaler...  Severe COPD/ Emphysema> He has evid for severe airflow obstruction, GOLD Stage3 COPD/ Emphysema, no desaturation w/ exercise, CTChest 02/2015 showed severe centrilob emphysema & several small nodules- largest 69mm in LUL & RML; On Symbicort160-2spBid & Spiriva Respimat-2sp once daily, and AlbutHFA rescue inhaler as needed...  Cigarette smoker> He has been smoking regularly since age 61, est 80+pack-yr smoking hx, he is proud that he has decr fro 2.5ppd to 1ppd currently; we discussed this & I stressed the need to quit completely...  Weight loss> He says his norm wt was ~150# and he has lost down to 114#; we reviewed nutrition, advised Ensure/ Sustacal/ Boost daily... EXAM showed Afeb, VSS, O2sat=95% on RA; HEENT- neg, mallampati1; Chest- decr BS bilat w/ sl end-exp wheezing; Heart- RR w/o m/r/g; Abd- soft, neg; Ext- w/o c/c/e; Neuro- no focal neuro deficits... IMP/PLAN>> Pt has severe COPD/emphysema & still smoking 1ppd- must quit all smoking & take his Symbicort/ Spiriva regularly & AlbutHFA as needed; He can use MUCINEX600- 2Bid w/ fluids for thick phlegm. We plan ROV 35mo & will plan f/u CTChest at that time...    No past medical history on file.  Limited english hampers medical history> notes from PCP reviewed...   No past surgical history on file.    Outpatient Encounter Prescriptions as of 08/02/2015  Medication Sig  . budesonide-formoterol (SYMBICORT) 160-4.5 MCG/ACT inhaler Inhale 2 puffs into the lungs 2 (two) times daily.  .  Tiotropium Bromide Monohydrate (SPIRIVA RESPIMAT) 2.5 MCG/ACT AERS Inhale 2 puffs into the lungs daily.  . Albuterol Sulfate (PROAIR RESPICLICK) 123XX123 (90 BASE) MCG/ACT AEPB Inhale 2 puffs into the lungs every 6 (six) hours as needed. (Patient not taking: Reported on 08/02/2015)   No facility-administered encounter medications on file as of 08/02/2015.    No Known Allergies   Current Medications, Allergies, Past Medical History, Past Surgical History, Family History, and Social History were reviewed in [employer] record.   Review of Systems            All symptoms NEG except where BOLDED >>  Constitutional:  F/C/S, fatigue, anorexia, unexpected weight change. HEENT:  HA, visual changes, hearing loss, earache, nasal symptoms, sore throat, mouth sores, hoarseness. Resp:  cough, sputum, hemoptysis; SOB, tightness, wheezing. Cardio:  CP, palpit, DOE, orthopnea, edema. GI:  N/V/D/C, blood in stool; reflux, abd pain, distention, gas. GU:  dysuria, freq, urgency, hematuria, flank pain, voiding difficulty. MS:  joint pain, swelling, tenderness, decr ROM; neck pain, back pain, etc. Neuro:  HA, tremors, seizures, dizziness, syncope, weakness, numbness, gait abn. Skin:  suspicious lesions or skin rash. Heme:  adenopathy, bruising, bleeding. Psyche:  confusion, agitation, sleep disturbance, hallucinations, anxiety, depression suicidal.   Objective:   Physical Exam      Vital Signs:  Reviewed...  General:  WD, cachectic, 60 y/o WM in NAD; alert & oriented; pleasant & cooperative... HEENT:  [place]/AT; Conjunctiva- pink, Sclera- nonicteric, EOM-wnl, PERRLA, EACs-clear, TMs-wnl; NOSE-clear; THROAT-clear & wnl. Neck:  Supple w/ fair ROM; no JVD; normal carotid impulses w/o bruits; no thyromegaly or nodules palpated; no lymphadenopathy. Chest:  Decr BS bilat, unable to augment BS voluntarily, clear without wheezes, rales, or rhonchi heard. Heart:  Epig PMI, Regular Rhythm; norm S1 &  S2 without murmurs, rubs, or gallops detected. Abdomen:  Thin, soft & nontender- no guarding or rebound; normal bowel sounds; no organomegaly or masses palpated. Ext:  Normal ROM; without deformities or arthritic changes; no varicose veins, venous insuffic, or edema;  Pulses intact w/o bruits. Neuro:  No focal neuro deficits; sensory testing normal; gait normal & balance OK. Derm:  No lesions noted; no rash etc. Lymph:  No cervical, supraclavicular, axillary, or inguinal adenopathy palpated.   Assessment:      IMP >>     Severe COPD/ Emphysema    Cigarette smoker    Weight loss  PLAN >>  02/16/15>  [name] has been a life long smoker & has paid a price for this behavior w/ severe emphysema; despite still working at [employer] w/ 4 daughters to support- he continues to smoke 1ppd @$5 per pack= #150/mo & I have appealed to his parental instincts to quit smoking completely but he is not optimistic that he will be able to do so & unwilling to purchase smoking cessation aides like Chantix, Nicotine replacement, etc... In addition to smoking cessation we have prescribed SYMBICORT160- 2spBid, & SPIRIVA daily, along w/ ProairHFA rescue inhaler for prn use (coupons given taking advantage of all known discount programs)... For completeness I have rec a CT Chest (esp in light of his wt loss) and his PCP may want to consider CT Abd & Pelvis as well... He is rec to start eating better, 3 meals/day, plus nutritional supplements Bid;  Finally he needs the full benefits for avail immunization programs=> Prevnar-13, Pneumovax-23, Flu shots, etc (we will leave this to his PCP to administer)...  03/30/15>  [name] is improved on the inhalers and we provided him w/ some samples and Rx but it is unclear if he will be able to afford them- discount cards & drug company programs have been provided...  08/02/15>   Pt has severe COPD/emphysema & still smoking 1ppd- must quit all smoking & take his Symbicort/ Spiriva regularly &  AlbutHFA as needed; He can use MUCINEX600- 2Bid w/ fluids for thick phlegm. We plan ROV 71mo & will plan f/u CTChest at that time      Plan:     Patient's Medications  New Prescriptions   No medications on file  Previous Medications   ALBUTEROL SULFATE (PROAIR RESPICLICK) 123XX123 (90 BASE) MCG/ACT AEPB    Inhale 2 puffs into the lungs every 6 (six) hours as needed.   BUDESONIDE-FORMOTEROL (SYMBICORT) 160-4.5 MCG/ACT INHALER    Inhale 2 puffs into the lungs 2 (two) times daily.   TIOTROPIUM BROMIDE MONOHYDRATE (SPIRIVA RESPIMAT) 2.5 MCG/ACT AERS    Inhale 2 puffs into the lungs daily.  Modified  Medications   No medications on file  Discontinued Medications   No medications on file

## 2016-01-10 ENCOUNTER — Encounter: Payer: Self-pay | Admitting: Pulmonary Disease

## 2016-01-10 ENCOUNTER — Ambulatory Visit (INDEPENDENT_AMBULATORY_CARE_PROVIDER_SITE_OTHER): Payer: BLUE CROSS/BLUE SHIELD | Admitting: Pulmonary Disease

## 2016-01-10 VITALS — BP 124/70 | HR 91 | Temp 98.1°F | Wt 109.0 lb

## 2016-01-10 VITALS — BP 142/90 | HR 89 | Temp 97.7°F | Resp 18 | Ht 67.0 in | Wt 114.0 lb

## 2016-01-10 DIAGNOSIS — R634 Abnormal weight loss: Secondary | ICD-10-CM

## 2016-01-10 DIAGNOSIS — R938 Abnormal findings on diagnostic imaging of other specified body structures: Secondary | ICD-10-CM

## 2016-01-10 DIAGNOSIS — Z720 Tobacco use: Secondary | ICD-10-CM | POA: Diagnosis not present

## 2016-01-10 DIAGNOSIS — F17210 Nicotine dependence, cigarettes, uncomplicated: Secondary | ICD-10-CM

## 2016-01-10 DIAGNOSIS — J432 Centrilobular emphysema: Secondary | ICD-10-CM | POA: Diagnosis not present

## 2016-01-10 DIAGNOSIS — R636 Underweight: Secondary | ICD-10-CM

## 2016-01-10 DIAGNOSIS — R9389 Abnormal findings on diagnostic imaging of other specified body structures: Secondary | ICD-10-CM

## 2016-01-10 MED ORDER — BUDESONIDE-FORMOTEROL FUMARATE 160-4.5 MCG/ACT IN AERO: 2.0000 | INHALATION_SPRAY | Freq: Two times a day (BID) | RESPIRATORY_TRACT | Status: DC

## 2016-01-10 MED ORDER — TIOTROPIUM BROMIDE MONOHYDRATE 2.5 MCG/ACT IN AERS: 2.0000 | INHALATION_SPRAY | Freq: Every day | RESPIRATORY_TRACT | Status: DC

## 2016-01-10 MED ORDER — ALBUTEROL SULFATE 108 (90 BASE) MCG/ACT IN AEPB: 2.0000 | INHALATION_SPRAY | Freq: Four times a day (QID) | RESPIRATORY_TRACT | Status: DC | PRN

## 2016-01-10 NOTE — Patient Instructions (Signed)
Today we updated your med list in our EPIC system...    Continue your current medications the same...  Mr. Basom  You need to quit all the smoking before it is too late!!!  Continue the Symbicort160- 2 sprays twice daily...  Continue the Spiriva Respimat-  2 inhalations once daily...  For the thick sputum>    You can use the OTC MUCINEX 600mg  tabs= 2 tabs twice daily w/ extra fluids...  For your weight loss>    Try to eat a little at mealtimes...    Add a nutritional supplement-- ENSURE, SUSTACAL, BOOST, Carn[employer] Instant Breakfast- at least 2 cans per day betw the mealtimes...  Today we did a repeat spirometry test, an ambulatory oximetry test, and we will set up a repeat CT scan of your chest...    We will contact [facility] w/ the results when available...   Call for any questions...  Let's plan a follow up visit in 43mo, sooner if needed for problems.Marland KitchenMarland Kitchen

## 2016-01-10 NOTE — Progress Notes (Signed)
Subjective:     Patient ID: Bobby Caldwell, male   DOB: 1956/03/28, 60 y.o.   MRN: [id number]  HPI ~  February 16, 2015:  Initial pulmonary consult by SN>   51 y/o gentleman from [place], referred by Minette Brine NP at [facility] (Dr. Glendale Chard), for pulmonary evaluation due to emphysema>      Bobby Caldwell has been a heavy smoker- started at age 77 y/o when they would "roll your own" & smoked continually up to 2ppd for ~63yr before decr to 1ppd more recently, est ~70-80 pack-yr smoking hx... He c/o SOB/ DOE, notes mild cough, sm amt beige sput, no hemptysis, occas sharp CP & palpit, no edema...  He denies signif hx of active lung problems- w/o recurrent bronchitic infections, pneumonia, Tb or known exposure, asthma, etc;  Neither has he had any hx signif cardiac problems- w/o known CAD, CHF, etc... He denies occupational exposures to asbestos, silica, mining, etc...      Bobby Caldwell also notes weight loss- states that his regular weight is ~150# & he currently weighs 113#, 5'7" Tall, BMI=17-18; appetite is poor & is rec to add nutritional supplements Bid betw meals...      Current Meds> NONE- he works at [employer] are too $$ for him... EXAM showed Afeb, VSS, O2sat=97% on RA;  HEENT- neg, mallampati1;  Chest- decr BS bilat w/ sl end-exp wheezing;  Heart- RR w/o m/r/g;  Abd- soft, neg;  Ext- w/o c/c/e;  Neuro- no focal neuro deficits...  CXR 01/15/15 showed hyperinflation, emphysema, NAD...  Spirometry 02/16/15 showed FVC=2.66 (61%), FEV1=1.25 (36%), %1sec=47, mid-flows reduced at 19% predicted... This is c/w severe airflow obstruction and GOLD Stage 3 COPD.  Ambulatory oxygen saturation test 02/16/15 showed O2sat=98% on RA at rest w/ pulse=86/min; he ambulated 3 laps w/ lowest O2sat=98% w/ HR=111/min...  LABS 01/2015 from [facility] wnl & scanned into EPIC...   CT Chest w/o contrast> pending (see below) IMP/PLAN>>  Bobby Caldwell has been a life long smoker & has paid a price for  this behavior w/ severe emphysema; despite still working at [employer]/ 4 daughters to support- he continues to smoke 1ppd @$5 per pack= #150/mo & I have appealed to his parental instincts to quit smoking completely but he is not optimistic that he will be able to do so & unwilling to purchase smoking cessation aides like Chantix, Nicotine replacement, etc... In addition to smoking cessation we have prescribed SYMBICORT160- 2spBid, & SPIRIVA daily, along w/ ProairHFA rescue inhaler for prn use (coupons given taking advantage of all known discount programs)... For completeness I have rec a CT Chest (esp in light of his wt loss) and his PCP may want to consider CT Abd & Pelvis as well... He is rec to start eating better, 3 meals/day, plus nutritional supplements Bid;  Finally he needs the full benefits for avail immunization programs=> Prevnar-13, Pneumovax-23, Flu shots, etc (we will leave this to his PCP to administer)... Rec ROV recheck in 6 weeks. ADDENDUM>>  CT Chest 02/22/15 showed severe centrilobular emphysema, no adenopathy, several small pulm nodules- largest is 622min LUL & RML... rec f/u CT in 47m73mo~  March 30, 2015:  6wk ROV w/ SN>        Bobby Caldwell reports that he is improved on the Symbicort160-2spBid, Spiriva daily, and the AlbutHFA prn (hasn't needed);  He notes that he's decr the smoking to 7-10 cig/d now & we discussed the need to quit completely;  He notes min cough, no sputum or hemoptysis, stable SOB/DOE, no CP/ palpit/ or edema...  EXAM showed Afeb, VSS, O2sat=95% on RA;  HEENT- neg, mallampati1;  Chest- decr BS bilat w/ sl end-exp wheezing;  Heart- RR w/o m/r/g;  Abd- soft, neg;  Ext- w/o c/c/e;  Neuro- no focal neuro deficits... IMP/PLAN>>  Bobby Caldwell is improved on the inhalers and we provided him w/ some samples and Rx but it is unclear if he will be able to afford them- discount cards & drug company programs have been provided... We plan ROV 3-4 months...  ~  August 02, 2015:  85mo ROV  w/ SN>      Bobby Caldwell returns, unaccompanied for today's visit, states his breathing is unchanged- c/o DOE w/ min activ, cough w/ yellow sput; he denies CP, hemoptysis, edema; he is still able to perform ADLs but SOB w/ walking/ stairs/ etc; he reports that he has decreased his smoking from 2.5ppd to 1ppd at present; ?if he is really using his [place] once daily?  He admits to running out of his AlbutHFA rescue inhaler...    Severe COPD/ Emphysema>  He has evid for severe airflow obstruction, GOLD Stage3 COPD/ Emphysema, no desaturation w/ exercise, CTChest 02/2015 showed severe centrilob emphysema & several small nodules- largest 10mm in LUL & RML;  On Symbicort160-2spBid & Spiriva Respimat-2sp once daily, and AlbutHFA rescue inhaler as needed...    Cigarette smoker>  He has been smoking regularly since age 23, est 80+pack-yr smoking hx, he is proud that he has decr fro 2.5ppd to 1ppd currently; we discussed this & I stressed the need to quit completely...    Weight loss>  He says his norm wt was ~150# and he has lost down to 114#; we reviewed nutrition, advised Ensure/ Sustacal/ Boost daily... EXAM showed Afeb, VSS, O2sat=95% on RA;  HEENT- neg, mallampati1;  Chest- decr BS bilat w/ sl end-exp wheezing;  Heart- RR w/o m/r/g;  Abd- soft, neg;  Ext- w/o c/c/e;  Neuro- no focal neuro deficits... IMP/PLAN>>  Pt has severe COPD/emphysema & still smoking 1ppd- must quit all smoking & take his Symbicort/ Spiriva regularly & AlbutHFA as needed;  He can use MUCINEX600- 2Bid w/ fluids for thick phlegm.  We plan ROV 61mo & will plan f/u CTChest at that time...    No past medical history on file.  Limited English hampers medical history> notes from PCP reviewed...   No past surgical history on file.    Outpatient Encounter Prescriptions as of 01/10/2016  Medication Sig  . Albuterol Sulfate (PROAIR RESPICLICK) 123XX123 (90 BASE) MCG/ACT AEPB Inhale 2 puffs into the lungs every 6 (six) hours as  needed. (Patient not taking: Reported on 08/02/2015)  . budesonide-formoterol (SYMBICORT) 160-4.5 MCG/ACT inhaler Inhale 2 puffs into the lungs 2 (two) times daily.  . Tiotropium Bromide Monohydrate (SPIRIVA RESPIMAT) 2.5 MCG/ACT AERS Inhale 2 puffs into the lungs daily.   No facility-administered encounter medications on file as of 01/10/2016.    No Known Allergies   Current Medications, Allergies, Past Medical History, Past Surgical History, Family History, and Social History were reviewed in [employer] record.   Review of Systems            All symptoms NEG except where BOLDED >>  Constitutional:  F/C/S, fatigue, anorexia, unexpected weight change. HEENT:  HA, visual changes, hearing loss, earache, nasal symptoms, sore throat, mouth sores, hoarseness. Resp:  cough, sputum, hemoptysis; SOB, tightness, wheezing. Cardio:  CP, palpit,  DOE, orthopnea, edema. GI:  N/V/D/C, blood in stool; reflux, abd pain, distention, gas. GU:  dysuria, freq, urgency, hematuria, flank pain, voiding difficulty. MS:  joint pain, swelling, tenderness, decr ROM; neck pain, back pain, etc. Neuro:  HA, tremors, seizures, dizziness, syncope, weakness, numbness, gait abn. Skin:  suspicious lesions or skin rash. Heme:  adenopathy, bruising, bleeding. Psyche:  confusion, agitation, sleep disturbance, hallucinations, anxiety, depression suicidal.   Objective:   Physical Exam      Vital Signs:  Reviewed...  General:  WD, cachectic, 60 y/o WM in NAD; alert & oriented; pleasant & cooperative... HEENT:  [place]/AT; Conjunctiva- pink, Sclera- nonicteric, EOM-wnl, PERRLA, EACs-clear, TMs-wnl; NOSE-clear; THROAT-clear & wnl. Neck:  Supple w/ fair ROM; no JVD; normal carotid impulses w/o bruits; no thyromegaly or nodules palpated; no lymphadenopathy. Chest:  Decr BS bilat, unable to augment BS voluntarily, clear without wheezes, rales, or rhonchi heard. Heart:  Epig PMI, Regular Rhythm; norm S1 & S2  without murmurs, rubs, or gallops detected. Abdomen:  Thin, soft & nontender- no guarding or rebound; normal bowel sounds; no organomegaly or masses palpated. Ext:  Normal ROM; without deformities or arthritic changes; no varicose veins, venous insuffic, or edema;  Pulses intact w/o bruits. Neuro:  No focal neuro deficits; sensory testing normal; gait normal & balance OK. Derm:  No lesions noted; no rash etc. Lymph:  No cervical, supraclavicular, axillary, or inguinal adenopathy palpated.   Assessment:      IMP >>     Severe COPD/ Emphysema    Cigarette smoker    Weight loss  PLAN >>  02/16/15>  Bobby Caldwell has been a life long smoker & has paid a price for this behavior w/ severe emphysema; despite still working at [employer] w/ 4 daughters to support- he continues to smoke 1ppd @$5 per pack= #150/mo & I have appealed to his parental instincts to quit smoking completely but he is not optimistic that he will be able to do so & unwilling to purchase smoking cessation aides like Chantix, Nicotine replacement, etc... In addition to smoking cessation we have prescribed SYMBICORT160- 2spBid, & SPIRIVA daily, along w/ ProairHFA rescue inhaler for prn use (coupons given taking advantage of all known discount programs)... For completeness I have rec a CT Chest (esp in light of his wt loss) and his PCP may want to consider CT Abd & Pelvis as well... He is rec to start eating better, 3 meals/day, plus nutritional supplements Bid;  Finally he needs the full benefits for avail immunization programs=> Prevnar-13, Pneumovax-23, Flu shots, etc (we will leave this to his PCP to administer)... Rec ROV recheck in 6 weeks. 03/30/15>  Bobby Caldwell is improved on the inhalers and we provided him w/ some samples and Rx but it is unclear if he will be able to afford them- discount cards & drug company programs have been provided... We plan ROV 3-4 months. 08/02/15>   Pt has severe COPD/emphysema & still smoking 1ppd- must quit all  smoking & take his Symbicort/ Spiriva regularly & AlbutHFA as needed;  He can use MUCINEX600- 2Bid w/ fluids for thick phlegm.  We plan ROV 30mo & will plan f/u CTChest at that time.      Plan:     Patient's Medications  New Prescriptions   No medications on file  Previous Medications   ALBUTEROL SULFATE (PROAIR RESPICLICK) 123XX123 (90 BASE) MCG/ACT AEPB    Inhale 2 puffs into the lungs every 6 (six) hours as needed.   BUDESONIDE-FORMOTEROL (SYMBICORT) 160-4.5  MCG/ACT INHALER    Inhale 2 puffs into the lungs 2 (two) times daily.   TIOTROPIUM BROMIDE MONOHYDRATE (SPIRIVA RESPIMAT) 2.5 MCG/ACT AERS    Inhale 2 puffs into the lungs daily.  Modified Medications   No medications on file  Discontinued Medications   No medications on file

## 2016-01-10 NOTE — Progress Notes (Addendum)
Subjective:     Patient ID: Bobby Caldwell, male   DOB: 1956/03/28, 60 y.o.   MRN: [id number]  HPI ~  February 16, 2015:  Initial pulmonary consult by SN>   51 y/o gentleman from [place], referred by Minette Brine NP at [facility] (Dr. Glendale Chard), for pulmonary evaluation due to emphysema>      Bobby Caldwell has been a heavy smoker- started at age 77 y/o when they would "roll your own" & smoked continually up to 2ppd for ~63yr before decr to 1ppd more recently, est ~70-80 pack-yr smoking hx... He c/o SOB/ DOE, notes mild cough, sm amt beige sput, no hemptysis, occas sharp CP & palpit, no edema...  He denies signif hx of active lung problems- w/o recurrent bronchitic infections, pneumonia, Tb or known exposure, asthma, etc;  Neither has he had any hx signif cardiac problems- w/o known CAD, CHF, etc... He denies occupational exposures to asbestos, silica, mining, etc...      Bobby Caldwell also notes weight loss- states that his regular weight is ~150# & he currently weighs 113#, 5'7" Tall, BMI=17-18; appetite is poor & is rec to add nutritional supplements Bid betw meals...      Current Meds> NONE- he works at [employer] are too $$ for him... EXAM showed Afeb, VSS, O2sat=97% on RA;  HEENT- neg, mallampati1;  Chest- decr BS bilat w/ sl end-exp wheezing;  Heart- RR w/o m/r/g;  Abd- soft, neg;  Ext- w/o c/c/e;  Neuro- no focal neuro deficits...  CXR 01/15/15 showed hyperinflation, emphysema, NAD...  Spirometry 02/16/15 showed FVC=2.66 (61%), FEV1=1.25 (36%), %1sec=47, mid-flows reduced at 19% predicted... This is c/w severe airflow obstruction and GOLD Stage 3 COPD.  Ambulatory oxygen saturation test 02/16/15 showed O2sat=98% on RA at rest w/ pulse=86/min; he ambulated 3 laps w/ lowest O2sat=98% w/ HR=111/min...  LABS 01/2015 [facility] & scanned into EPIC...   CT Chest w/o contrast> pending (see below) IMP/PLAN>>  Bobby Caldwell has been a life long smoker & has paid a price for  this behavior w/ severe emphysema; despite still working at [employer]/ 4 daughters to support- he continues to smoke 1ppd @$5 per pack= #150/mo & I have appealed to his parental instincts to quit smoking completely but he is not optimistic that he will be able to do so & unwilling to purchase smoking cessation aides like Chantix, Nicotine replacement, etc... In addition to smoking cessation we have prescribed SYMBICORT160- 2spBid, & SPIRIVA daily, along w/ ProairHFA rescue inhaler for prn use (coupons given taking advantage of all known discount programs)... For completeness I have rec a CT Chest (esp in light of his wt loss) and his PCP may want to consider CT Abd & Pelvis as well... He is rec to start eating better, 3 meals/day, plus nutritional supplements Bid;  Finally he needs the full benefits for avail immunization programs=> Prevnar-13, Pneumovax-23, Flu shots, etc (we will leave this to his PCP to administer)... Rec ROV recheck in 6 weeks. ADDENDUM>>  CT Chest 02/22/15 showed severe centrilobular emphysema, no adenopathy, several small pulm nodules- largest is 622min LUL & RML... rec f/u CT in 47m73mo~  March 30, 2015:  6wk ROV w/ SN>        Bobby Caldwell reports that he is improved on the Symbicort160-2spBid, Spiriva daily, and the AlbutHFA prn (hasn't needed);  He notes that he's decr the smoking to 7-10 cig/d now & we discussed the need to quit completely;  He notes min cough, no sputum or hemoptysis, stable SOB/DOE, no CP/ palpit/ or edema...  EXAM showed Afeb, VSS, O2sat=95% on RA;  HEENT- neg, mallampati1;  Chest- decr BS bilat w/ sl end-exp wheezing;  Heart- RR w/o m/r/g;  Abd- soft, neg;  Ext- w/o c/c/e;  Neuro- no focal neuro deficits... IMP/PLAN>>  Bobby Caldwell is improved on the inhalers and we provided him w/ some samples and Rx but it is unclear if he will be able to afford them- discount cards & drug company programs have been provided... We plan ROV 3-4 months...  ~ August 02, 2015: 47moROV  w/ SN>   Bobby Caldwell returns, unaccompanied for today's visit, states his breathing is unchanged- c/o DOE w/ min activ, cough w/ yellow sput; he denies CP, hemoptysis, edema; he is still able to perform ADLs but SOB w/ walking/ stairs/ etc; he reports that he has decreased his smoking from 2.5ppd to 1ppd at present; ?if he is really using his STrentonce daily? He admits to running out of his AlbutHFA rescue inhaler...  Severe COPD/ Emphysema> He has evid for severe airflow obstruction, GOLD Stage3 COPD/ Emphysema, no desaturation w/ exercise, CTChest 02/2015 showed severe centrilob emphysema & several small nodules- largest 64min LUL & RML; On Symbicort160-2spBid & Spiriva Respimat-2sp once daily, and AlbutHFA rescue inhaler as needed...  Cigarette smoker> He has been smoking regularly since age 63,16est 80+pack-yr smoking hx, he is proud that he has decr fro 2.5ppd to 1ppd currently; we discussed this & I stressed the need to quit completely...  Weight loss> He says his norm wt was ~150# and he has lost down to 114#; we reviewed nutrition, advised Ensure/ Sustacal/ Boost daily... EXAM showed Afeb, VSS, O2sat=95% on RA; HEENT- neg, mallampati1; Chest- decr BS bilat w/ sl end-exp wheezing; Heart- RR w/o m/r/g; Abd- soft, neg; Ext- w/o c/c/e; Neuro- no focal neuro deficits... IMP/PLAN>> Pt has severe COPD/emphysema & still smoking 1ppd- must quit all smoking & take his Symbicort/ Spiriva regularly & AlbutHFA as needed; He can use MUCINEX600- 2Bid w/ fluids for thick phlegm. We plan ROV 51m37mowill plan f/u CTChest at that time...  ~  January 10, 2016:  51mo50mo w/ SN>     Bobby Caldwell is here today w/ his friend & neighbor "like my nephew" named [name] phone #[phone] is still smoking 1ppd and can't vs won't quit, not motivated despite my harsh warnings and the fact that he has 3 daughters;  He remains SOB w/ min activity &  ADLs and estimates that it's been ~50yrs67yrce he could do ADLs w/o the dyspnea;  Notes mild cough, sm amt thick greenish sput, no hemoptysis; notes sl chest discomfort but no severe pain or pleurisy, no f/c/s, no real COPD exac in the interval;  He also reports poor appetite 7 not eating well, weight is down 5# to 109# today... He informs me that he is leaving for [place] 7/22 7 will be gone 5wks- sched to ret 8/28...  We reviewed the following medical problems during today's office visit >>   Severe COPD/ Emphysema> He has evid for severe airflow obstruction, GOLD Stage3 COPD/ Emphysema, no desaturation w/ exercise, CTChest 02/2015 showed severe centrilob emphysema & several small nodules- largest 51mm i58mUL & RML; On Symbicort160-2spBid & Spiriva Respimat-2sp once daily, and AlbutHFA rescue inhaler as needed;  SOB w/ ADLs at baseline, he has not had any signif resp exac in  the interval...    Bilat small pulm nodules on CT chest 02/2015 + centrilobular emphysema>  He is due for f/u CT Chest to assertain if any of the lesions has grown in the interim...  Cigarette smoker> He has been smoking regularly since age 40, est 80+pack-yr smoking hx, he is proud that he has decr fro 2.5ppd to 1ppd currently; we discussed this & I stressed the need to quit completely...  Weight loss/ underweight/ poor appetite> He says his norm wt was ~150# and he has lost down to 109#; we reviewed nutrition, advised Ensure/ Sustacal/ Boost daily... EXAM showed Afeb, VSS, O2sat=94% on RA; Wt=109#, 5'7"Tall, BM17;  HEENT- neg, mallampati1; Chest- decr BS bilat w/ sl end-exp wheezing; Heart- RR w/o m/r/g; Abd- soft, neg; Ext- w/o c/c/e; Neuro- no focal neuro deficits...  Spirometry 01/10/16>  FVC=2.93 (69%), FEV1=1.03 (31%), %1sec=35%, mid-flows reduced at 16% predicted... This is c/w severe airflow obstruction & GOLD Stage 3-4 COPD  Ambulatory Oximetry 01/10/16>  O2sat=95% on RA at rest w/ pulse=99/min;  He ambulated 3 laps w/  lowest O2sat=95% w/ pulse=99/min; no desaturations...   CTChest >> DONE 01/17/16> norm heart size, atherosclerotic changes in Ao & LAD, no adenopathy, mod centrilob emphysema w/ diffuse bronchial wall thickening, bilat calcif granuloma, 30m RML & LUL nodules + several smaller bilat nodules- NO CHANGES, no new lesions, & no consolidative airsp dis... We plan repeat CT in 150yr. IMP/PLAN>>  Bobby Caldwell understand that he must quite all smoking immediately; his FEV1 has deteriorated 18% in the last yr down to 1.03L;  His oxygenation remains stable w/o desat;  He is asked to quit all smoking, stay on the Symbicort160-2spBid, spiriva Respimat- 2sp once daily, and the ProairHFA rescue inhaler prn;  He is again asked to start nutritional supplements w/ Ensure/ Sustacal/ Boost at least bid; he understands that w/o smoking cessation that his prognosis is very poor;  He works at [employer] asking about disability- I supported his application for this & explained the diff betw disability from a work poActuarys SSI;  His f/u CT Chest is pending...    No past medical history on file.  Limited english hampers medical history> notes from PCP reviewed...   No past surgical history on file.    Outpatient Encounter Prescriptions as of 01/10/2016  Medication Sig  . Albuterol Sulfate (PROAIR RESPICLICK) 1001090 Base) MCG/ACT AEPB Inhale 2 puffs into the lungs every 6 (six) hours as needed.  . budesonide-formoterol (SYMBICORT) 160-4.5 MCG/ACT inhaler Inhale 2 puffs into the lungs 2 (two) times daily.  . Tiotropium Bromide Monohydrate (SPIRIVA RESPIMAT) 2.5 MCG/ACT AERS Inhale 2 puffs into the lungs daily.  . [DISCONTINUED] Albuterol Sulfate (PROAIR RESPICLICK) 1027290 BASE) MCG/ACT AEPB Inhale 2 puffs into the lungs every 6 (six) hours as needed.  . [DISCONTINUED] budesonide-formoterol (SYMBICORT) 160-4.5 MCG/ACT inhaler Inhale 2 puffs into the lungs 2 (two) times daily.  . [DISCONTINUED] Tiotropium Bromide Monohydrate  (SPIRIVA RESPIMAT) 2.5 MCG/ACT AERS Inhale 2 puffs into the lungs daily.   No facility-administered encounter medications on file as of 01/10/2016.    No Known Allergies   Current Medications, Allergies, Past Medical History, Past Surgical History, Family History, and Social History were reviewed in [employer].   Review of Systems            All symptoms NEG except where BOLDED >>  Constitutional:  F/C/S, fatigue, anorexia, unexpected weight change. HEENT:  HA, visual changes, hearing loss, earache, nasal symptoms, sore  throat, mouth sores, hoarseness. Resp:  cough, sputum, hemoptysis; SOB, tightness, wheezing. Cardio:  CP, palpit, DOE, orthopnea, edema. GI:  N/V/D/C, blood in stool; reflux, abd pain, distention, gas. GU:  dysuria, freq, urgency, hematuria, flank pain, voiding difficulty. MS:  joint pain, swelling, tenderness, decr ROM; neck pain, back pain, etc. Neuro:  HA, tremors, seizures, dizziness, syncope, weakness, numbness, gait abn. Skin:  suspicious lesions or skin rash. Heme:  adenopathy, bruising, bleeding. Psyche:  confusion, agitation, sleep disturbance, hallucinations, anxiety, depression suicidal.   Objective:   Physical Exam      Vital Signs:  Reviewed...  General:  WD, cachectic, 60 y/o WM in NAD; alert & oriented; pleasant & cooperative... HEENT:  [place]/AT; Conjunctiva- pink, Sclera- nonicteric, EOM-wnl, PERRLA, EACs-clear, TMs-wnl; NOSE-clear; THROAT-clear & wnl. Neck:  Supple w/ fair ROM; no JVD; normal carotid impulses w/o bruits; no thyromegaly or nodules palpated; no lymphadenopathy. Chest:  Decr BS bilat, unable to augment BS voluntarily, clear without wheezes, rales, or rhonchi heard. Heart:  Epig PMI, Regular Rhythm; norm S1 & S2 without murmurs, rubs, or gallops detected. Abdomen:  Thin, soft & nontender- no guarding or rebound; normal bowel sounds; no organomegaly or masses palpated. Ext:  Normal ROM; without deformities or  arthritic changes; no varicose veins, venous insuffic, or edema;  Pulses intact w/o bruits. Neuro:  No focal neuro deficits; sensory testing normal; gait normal & balance OK. Derm:  No lesions noted; no rash etc. Lymph:  No cervical, supraclavicular, axillary, or inguinal adenopathy palpated.   Assessment:      IMP >>   Severe COPD/ Emphysema> on Symbicort160-2spBid & Spiriva Respimat-2sp once daily, and AlbutHFA rescue inhaler as needed;  SOB w/ ADLs at baseline, he has not had any signif resp exac in the interval...    Bilat small pulm nodules on CT chest 02/2015 + centrilobular emphysema>  He is due for f/u CT Chest to assertain if any of the lesions has grown in the interim...  Cigarette smoker> He has been smoking regularly since age 47, est 80+pack-yr smoking hx, he is proud that he has decr fro 2.5ppd to 1ppd currently; we discussed this & I stressed the need to quit completely...  Weight loss/ underweight/ poor appetite> He says his norm wt was ~150# and he has lost down to 109#; we reviewed nutrition, advised Ensure/ Sustacal/ Boost daily...  PLAN >>  02/16/15>  Bobby Caldwell has been a life long smoker & has paid a price for this behavior w/ severe emphysema; despite still working at [employer] w/ 4 daughters to support- he continues to smoke 1ppd @$5 per pack= #150/mo & I have appealed to his parental instincts to quit smoking completely but he is not optimistic that he will be able to do so & unwilling to purchase smoking cessation aides like Chantix, Nicotine replacement, etc... In addition to smoking cessation we have prescribed SYMBICORT160- 2spBid, & SPIRIVA daily, along w/ ProairHFA rescue inhaler for prn use (coupons given taking advantage of all known discount programs)... For completeness I have rec a CT Chest (esp in light of his wt loss) and his PCP may want to consider CT Abd & Pelvis as well... He is rec to start eating better, 3 meals/day, plus nutritional supplements Bid;   Finally he needs the full benefits for avail immunization programs=> Prevnar-13, Pneumovax-23, Flu shots, etc (we will leave this to his PCP to administer)...  03/30/15>  Bobby Caldwell is improved on the inhalers and we provided him w/ some samples and Rx but  it is unclear if he will be able to afford them- discount cards & drug company programs have been provided...  08/02/15>   Pt has severe COPD/emphysema & still smoking 1ppd- must quit all smoking & take his Symbicort/ Spiriva regularly & AlbutHFA as needed; He can use MUCINEX600- 2Bid w/ fluids for thick phlegm. We plan ROV 40mo& will plan f/u CTChest at that time 01/10/16>   He must quit all smoking; f/u CT Chest is pending; continue Symbicort/ Spiriva/ Proair...      Plan:     Patient's Medications  New Prescriptions   No medications on file  Previous Medications   No medications on file  Modified Medications   Modified Medication Previous Medication   ALBUTEROL SULFATE (PROAIR RESPICLICK) 1536(90 BASE) MCG/ACT AEPB Albuterol Sulfate (PROAIR RESPICLICK) 1144(90 BASE) MCG/ACT AEPB      Inhale 2 puffs into the lungs every 6 (six) hours as needed.    Inhale 2 puffs into the lungs every 6 (six) hours as needed.   BUDESONIDE-FORMOTEROL (SYMBICORT) 160-4.5 MCG/ACT INHALER budesonide-formoterol (SYMBICORT) 160-4.5 MCG/ACT inhaler      Inhale 2 puffs into the lungs 2 (two) times daily.    Inhale 2 puffs into the lungs 2 (two) times daily.   TIOTROPIUM BROMIDE MONOHYDRATE (SPIRIVA RESPIMAT) 2.5 MCG/ACT AERS Tiotropium Bromide Monohydrate (SPIRIVA RESPIMAT) 2.5 MCG/ACT AERS      Inhale 2 puffs into the lungs daily.    Inhale 2 puffs into the lungs daily.  Discontinued Medications   No medications on file

## 2016-01-17 ENCOUNTER — Ambulatory Visit (INDEPENDENT_AMBULATORY_CARE_PROVIDER_SITE_OTHER)
Admission: RE | Admit: 2016-01-17 | Discharge: 2016-01-17 | Disposition: A | Discharge status: Home / Self Care | Payer: BLUE CROSS/BLUE SHIELD | Source: Ambulatory Visit | Attending: Pulmonary Disease | Admitting: Pulmonary Disease

## 2016-01-17 DIAGNOSIS — R938 Abnormal findings on diagnostic imaging of other specified body structures: Secondary | ICD-10-CM | POA: Diagnosis not present

## 2016-01-17 DIAGNOSIS — J432 Centrilobular emphysema: Secondary | ICD-10-CM | POA: Diagnosis not present

## 2016-01-17 DIAGNOSIS — R9389 Abnormal findings on diagnostic imaging of other specified body structures: Secondary | ICD-10-CM

## 2016-01-21 ENCOUNTER — Telehealth: Payer: Self-pay | Admitting: Pulmonary Disease

## 2016-01-21 NOTE — Telephone Encounter (Signed)
Patient's daughter/ Translator called to get results from patients CT.  Gave results. Nothing further needed.

## 2016-01-31 ENCOUNTER — Ambulatory Visit: Payer: BLUE CROSS/BLUE SHIELD | Admitting: Pulmonary Disease

## 2016-02-08 ENCOUNTER — Encounter: Payer: Self-pay | Admitting: *Deleted

## 2017-03-19 ENCOUNTER — Ambulatory Visit
Admission: RE | Admit: 2017-03-19 | Discharge: 2017-03-19 | Disposition: A | Discharge status: Home / Self Care | Payer: BLUE CROSS/BLUE SHIELD | Source: Ambulatory Visit | Attending: Nurse Practitioner | Admitting: Nurse Practitioner

## 2017-03-19 ENCOUNTER — Other Ambulatory Visit: Payer: Self-pay | Admitting: Nurse Practitioner

## 2017-03-19 DIAGNOSIS — M545 Low back pain: Secondary | ICD-10-CM

## 2017-03-19 LAB — CBC AND DIFFERENTIAL
HCT: 50 (ref 41–53)
Hemoglobin: 16.8 (ref 13.5–17.5)
PLATELETS: 239 (ref 150–399)
WBC: 9.3

## 2017-03-19 LAB — BASIC METABOLIC PANEL
BUN: 11 (ref 4–21)
Creatinine: 0.6 (ref 0.6–1.3)
Glucose: 51
POTASSIUM: 4.8 (ref 3.4–5.3)
SODIUM: 143 (ref 137–147)

## 2017-03-19 LAB — TSH: TSH: 0.99 (ref 0.41–5.90)

## 2017-03-19 LAB — LIPID PANEL
CHOLESTEROL: 165 (ref 0–200)
HDL: 50 (ref 35–70)
LDL Cholesterol: 101
LDL/HDL RATIO: 2
Triglycerides: 69 (ref 40–160)

## 2017-03-19 LAB — VITAMIN D 25 HYDROXY (VIT D DEFICIENCY, FRACTURES): Vit D, 25-Hydroxy: 26.8

## 2017-03-19 LAB — HEMOGLOBIN A1C: Hemoglobin A1C: 5.5

## 2017-03-22 ENCOUNTER — Encounter: Payer: Self-pay | Admitting: Gastroenterology

## 2017-03-26 ENCOUNTER — Encounter: Payer: Self-pay | Admitting: Pulmonary Disease

## 2017-03-26 ENCOUNTER — Ambulatory Visit (INDEPENDENT_AMBULATORY_CARE_PROVIDER_SITE_OTHER): Payer: BLUE CROSS/BLUE SHIELD | Admitting: Pulmonary Disease

## 2017-03-26 ENCOUNTER — Other Ambulatory Visit (INDEPENDENT_AMBULATORY_CARE_PROVIDER_SITE_OTHER): Payer: BLUE CROSS/BLUE SHIELD

## 2017-03-26 VITALS — BP 150/90 | HR 107 | Temp 97.5°F | Wt 100.1 lb

## 2017-03-26 DIAGNOSIS — R636 Underweight: Secondary | ICD-10-CM | POA: Diagnosis not present

## 2017-03-26 DIAGNOSIS — F17210 Nicotine dependence, cigarettes, uncomplicated: Secondary | ICD-10-CM

## 2017-03-26 DIAGNOSIS — M79605 Pain in left leg: Secondary | ICD-10-CM

## 2017-03-26 DIAGNOSIS — I739 Peripheral vascular disease, unspecified: Secondary | ICD-10-CM | POA: Diagnosis not present

## 2017-03-26 DIAGNOSIS — R938 Abnormal findings on diagnostic imaging of other specified body structures: Secondary | ICD-10-CM

## 2017-03-26 DIAGNOSIS — R531 Weakness: Secondary | ICD-10-CM

## 2017-03-26 DIAGNOSIS — M79604 Pain in right leg: Secondary | ICD-10-CM

## 2017-03-26 DIAGNOSIS — R634 Abnormal weight loss: Secondary | ICD-10-CM

## 2017-03-26 DIAGNOSIS — R9389 Abnormal findings on diagnostic imaging of other specified body structures: Secondary | ICD-10-CM

## 2017-03-26 DIAGNOSIS — J432 Centrilobular emphysema: Secondary | ICD-10-CM

## 2017-03-26 LAB — CBC WITH DIFFERENTIAL/PLATELET
BASOS PCT: 1.2 % (ref 0.0–3.0)
Basophils Absolute: 0.1 10*3/uL (ref 0.0–0.1)
EOS PCT: 2.1 % (ref 0.0–5.0)
Eosinophils Absolute: 0.2 10*3/uL (ref 0.0–0.7)
HEMATOCRIT: 47.8 % (ref 39.0–52.0)
Hemoglobin: 16.3 g/dL (ref 13.0–17.0)
LYMPHS ABS: 2 10*3/uL (ref 0.7–4.0)
LYMPHS PCT: 27 % (ref 12.0–46.0)
MCHC: 34 g/dL (ref 30.0–36.0)
MCV: 89.2 fl (ref 78.0–100.0)
Monocytes Absolute: 0.6 10*3/uL (ref 0.1–1.0)
Monocytes Relative: 8.2 % (ref 3.0–12.0)
NEUTROS ABS: 4.5 10*3/uL (ref 1.4–7.7)
NEUTROS PCT: 61.5 % (ref 43.0–77.0)
PLATELETS: 216 10*3/uL (ref 150.0–400.0)
RBC: 5.36 Mil/uL (ref 4.22–5.81)
RDW: 13.8 % (ref 11.5–15.5)
WBC: 7.2 10*3/uL (ref 4.0–10.5)

## 2017-03-26 LAB — COMPREHENSIVE METABOLIC PANEL
ALT: 20 U/L (ref 0–53)
AST: 16 U/L (ref 0–37)
Albumin: 3.9 g/dL (ref 3.5–5.2)
Alkaline Phosphatase: 102 U/L (ref 39–117)
BUN: 10 mg/dL (ref 6–23)
CHLORIDE: 104 meq/L (ref 96–112)
CO2: 30 meq/L (ref 19–32)
Calcium: 9.1 mg/dL (ref 8.4–10.5)
Creatinine, Ser: 0.59 mg/dL (ref 0.40–1.50)
GFR: 148.9 mL/min (ref >60.00)
GLUCOSE: 75 mg/dL (ref 70–99)
POTASSIUM: 4.2 meq/L (ref 3.5–5.1)
SODIUM: 140 meq/L (ref 135–145)
TOTAL PROTEIN: 6.9 g/dL (ref 6.0–8.3)
Total Bilirubin: 0.4 mg/dL (ref 0.2–1.2)

## 2017-03-26 LAB — SEDIMENTATION RATE: Sed Rate: 2 mm/hr (ref 0–20)

## 2017-03-26 LAB — BRAIN NATRIURETIC PEPTIDE: Pro B Natriuretic peptide (BNP): 25 pg/mL (ref 0.0–100.0)

## 2017-03-26 LAB — TSH: TSH: 0.59 u[IU]/mL (ref 0.35–4.50)

## 2017-03-26 NOTE — Progress Notes (Addendum)
Subjective:     Patient ID: Bobby Caldwell, male   DOB: April 29, 1956, 61 y.o.   MRN: [id number]  HPI ~  February 16, 2015:  Initial pulmonary consult by SN>   78 y/o gentleman from [place], referred by Minette Brine NP at [facility] (Dr. Glendale Chard), for pulmonary evaluation due to emphysema>      [name] has been a heavy smoker- started at age 51 y/o when they would "roll your own" & smoked continually up to 2ppd for ~23yr before decr to 1ppd more recently, est ~70-80 pack-yr smoking hx... He c/o SOB/ DOE, notes mild cough, sm amt beige sput, no hemptysis, occas sharp CP & palpit, no edema...  He denies signif hx of active lung problems- w/o recurrent bronchitic infections, pneumonia, Tb or known exposure, asthma, etc;  Neither has he had any hx signif cardiac problems- w/o known CAD, CHF, etc... He denies occupational exposures to asbestos, silica, mining, etc...      [name] also notes weight loss- states that his regular weight is ~150# & he currently weighs 113#, 5'7" Tall, BMI=17-18; appetite is poor & is rec to add nutritional supplements Bid betw meals...      Current Meds> NONE- he works at [employer] are too $$ for him... EXAM showed Afeb, VSS, O2sat=97% on RA;  HEENT- neg, mallampati1;  Chest- decr BS bilat w/ sl end-exp wheezing;  Heart- RR w/o m/r/g;  Abd- soft, neg;  Ext- w/o c/c/e;  Neuro- no focal neuro deficits...  CXR 01/15/15 showed hyperinflation, emphysema, NAD...  Spirometry 02/16/15 showed FVC=2.66 (61%), FEV1=1.25 (36%), %1sec=47, mid-flows reduced at 19% predicted... This is c/w severe airflow obstruction and GOLD Stage 3 COPD.  Ambulatory oxygen saturation test 02/16/15 showed O2sat=98% on RA at rest w/ pulse=86/min; he ambulated 3 laps w/ lowest O2sat=98% w/ HR=111/min...  LABS 01/2015 [facility] & scanned into EPIC...   CT Chest w/o contrast> pending (see below) IMP/PLAN>>  [name] has been a life long smoker & has paid a price for  this behavior w/ severe emphysema; despite still working at [employer]/ 4 daughters to support- he continues to smoke 1ppd @$5 per pack= #150/mo & I have appealed to his parental instincts to quit smoking completely but he is not optimistic that he will be able to do so & unwilling to purchase smoking cessation aides like Chantix, Nicotine replacement, etc... In addition to smoking cessation we have prescribed SYMBICORT160- 2spBid, & SPIRIVA daily, along w/ ProairHFA rescue inhaler for prn use (coupons given taking advantage of all known discount programs)... For completeness I have rec a CT Chest (esp in light of his wt loss) and his PCP may want to consider CT Abd & Pelvis as well... He is rec to start eating better, 3 meals/day, plus nutritional supplements Bid;  Finally he needs the full benefits for avail immunization programs=> Prevnar-13, Pneumovax-23, Flu shots, etc (we will leave this to his PCP to administer)... Rec ROV recheck in 6 weeks. ADDENDUM>>  CT Chest 02/22/15 showed severe centrilobular emphysema, no adenopathy, several small pulm nodules- largest is 622min LUL & RML... rec f/u CT in 5m52mo~  March 30, 2015:  6wk ROV w/ SN>        Mr.Bobby Caldwell reports that he is improved on the Symbicort160-2spBid, Spiriva daily, and the AlbutHFA prn (hasn't needed);  He notes that he's decr the smoking to 7-10 cig/d now & we discussed the need to quit completely;  He notes min cough, no sputum or hemoptysis, stable SOB/DOE, no CP/ palpit/ or edema...  EXAM showed Afeb, VSS, O2sat=95% on RA;  HEENT- neg, mallampati1;  Chest- decr BS bilat w/ sl end-exp wheezing;  Heart- RR w/o m/r/g;  Abd- soft, neg;  Ext- w/o c/c/e;  Neuro- no focal neuro deficits... IMP/PLAN>>  [name] is improved on the inhalers and we provided him w/ some samples and Rx but it is unclear if he will be able to afford them- discount cards & drug company programs have been provided... We plan ROV 3-4 months...  ~ August 02, 2015: 42moROV  w/ SN>   Mr.Bobby Caldwell returns, unaccompanied for today's visit, states his breathing is unchanged- c/o DOE w/ min activ, cough w/ yellow sput; he denies CP, hemoptysis, edema; he is still able to perform ADLs but SOB w/ walking/ stairs/ etc; he reports that he has decreased his smoking from 2.5ppd to 1ppd at present; ?if he is really using his [place] daily? He admits to running out of his AlbutHFA rescue inhaler...  Severe COPD/ Emphysema> He has evid for severe airflow obstruction, GOLD Stage3 COPD/ Emphysema, no desaturation w/ exercise, CTChest 02/2015 showed severe centrilob emphysema & several small nodules- largest 644min LUL & RML; On Symbicort160-2spBid & Spiriva Respimat-2sp once daily, and AlbutHFA rescue inhaler as needed...  Cigarette smoker> He has been smoking regularly since age 53,6est 80+pack-yr smoking hx, he is proud that he has decr fro 2.5ppd to 1ppd currently; we discussed this & I stressed the need to quit completely...  Weight loss> He says his norm wt was ~150# and he has lost down to 114#; we reviewed nutrition, advised Ensure/ Sustacal/ Boost daily... EXAM showed Afeb, VSS, O2sat=95% on RA; HEENT- neg, mallampati1; Chest- decr BS bilat w/ sl end-exp wheezing; Heart- RR w/o m/r/g; Abd- soft, neg; Ext- w/o c/c/e; Neuro- no focal neuro deficits... IMP/PLAN>> Pt has severe COPD/emphysema & still smoking 1ppd- must quit all smoking & take his Symbicort/ Spiriva regularly & AlbutHFA as needed; He can use MUCINEX600- 2Bid w/ fluids for thick phlegm. We plan ROV 73m38mowill plan f/u CTChest at that time...  ~  January 10, 2016:  73mo73mo w/ SN>     Mr.Bobby Caldwell is here today w/ his friend & neighbor "like my nephew" named [name] phone #[phone] is still smoking 1ppd and can't vs won't quit, not motivated despite my harsh warnings and the fact that he has 3 daughters;  He remains SOB w/ min activity &  ADLs and estimates that it's been ~88yrs39yrce he could do ADLs w/o the dyspnea;  Notes mild cough, sm amt thick greenish sput, no hemoptysis; notes sl chest discomfort but no severe pain or pleurisy, no f/c/s, no real COPD exac in the interval;  He also reports poor appetite & not eating well, weight is down 5# to 109# today... He informs me that [facility]- sched to ret 8/28...  We reviewed the following medical problems during today's office visit >>   Severe COPD/ Emphysema> He has evid for severe airflow obstruction, GOLD Stage3 COPD/ Emphysema, no desaturation w/ exercise, CTChest 02/2015 showed severe centrilob emphysema & several small nodules- largest 73mm i68mUL & RML; On Symbicort160-2spBid & Spiriva Respimat-2sp once daily, and AlbutHFA rescue inhaler as needed;  SOB w/ ADLs at baseline, he has not had any signif resp exac in  the interval...    Bilat small pulm nodules on CT chest 02/2015 + centrilobular emphysema>  He is due for f/u CT Chest to assertain if any of the lesions has grown in the interim...  Cigarette smoker> He has been smoking regularly since age 34, est 80+pack-yr smoking hx, he is proud that he has decr fro 2.5ppd to 1ppd currently; we discussed this & I stressed the need to quit completely...  Weight loss/ underweight/ poor appetite> He says his norm wt was ~150# and he has lost down to 109#; we reviewed nutrition, advised Ensure/ Sustacal/ Boost daily... EXAM showed Afeb, VSS, O2sat=94% on RA; Wt=109#, 5'7"Tall, BM17;  HEENT- neg, mallampati1; Chest- decr BS bilat w/ sl end-exp wheezing; Heart- RR w/o m/r/g; Abd- soft, neg; Ext- w/o c/c/e; Neuro- no focal neuro deficits...  Spirometry 01/10/16>  FVC=2.93 (69%), FEV1=1.03 (31%), %1sec=35%, mid-flows reduced at 16% predicted... This is c/w severe airflow obstruction & GOLD Stage 3-4 COPD  Ambulatory Oximetry 01/10/16>  O2sat=95% on RA at rest w/ pulse=99/min;  He ambulated 3 laps w/  lowest O2sat=95% w/ pulse=99/min; no desaturations...   CTChest >> DONE 01/17/16> norm heart size, atherosclerotic changes in Ao & LAD, no adenopathy, mod centrilob emphysema w/ diffuse bronchial wall thickening, bilat calcif granuloma, 16m RML & LUL nodules + several smaller bilat nodules- NO CHANGES from 2016, no new lesions, & no consolidative airsp dis... We plan repeat CT in 167yr. IMP/PLAN>>  [name] understand that he must quite all smoking immediately; his FEV1 has deteriorated 18% in the last yr down to 1.03L;  His oxygenation remains stable w/o desat;  He is asked to quit all smoking, stay on the Symbicort160-2spBid, spiriva Respimat- 2sp once daily, and the ProairHFA rescue inhaler prn;  He is again asked to start nutritional supplements w/ Ensure/ Sustacal/ Boost at least bid; he understands that w/o smoking cessation that his prognosis is very poor;  He works at [employer] asking about disability- I supported his application for this & explained the diff betw disability from a work poActuarys SSI;  His f/u CT Chest is pending...   ~  March 26, 2017:  143moV & [name] returns after a long hiatus looking very gaunt & weak-- he has lost more weight & now weighs 100#, ~5'7"Tall, BMI=16; and still smoking daily- he admits to 3/4 ppd but has no interest in quitting smoking w/ mult excuses why HE cannot quit... His CC today is weakness & inability to walk w/ leg pain which is worse than his SOB/DOE from his emphysema... He is [place] little english-- we used a traOptometristdy over the speaker phone to communicate w/ him & this was quite satisfactory (call interpreter line at [phone] set this up & if he is at home a 3-way conf call can be set up using his home phone #[phone].    We reviewed the following medical problems during today's office visit >> he has not had any medical attention since he was here last- no PCP, no known ER visits, etc...  Severe COPD/  Emphysema> severe airflow obstruction, GOLD Stage3-4 COPD/ Emphysema, no desaturation w/ exercise, CTChest 02/2015 showed severe centrilob emphysema & several small nodules- largest 6mm73m LUL & RML (no change serially); On Symbicort160-2spBid & Spiriva Respimat-2sp once daily, Mucinex and AlbutHFA rescue inhaler as needed;  SOB w/ ADLs at baseline x yrs, he has not had any signif resp exac in the interval, he says he is more lim by his  leg symptoms...    Bilat small pulm nodules on CT chest 02/2015 + centrilobular emphysema> small bilat lung nodules are unchanged serial w/ CT scans in 2016, 2017, 2018...  Cigarette smoker> He has been smoking regularly since age 47, est 80+pack-yr smoking hx, he is proud that he has decr from 2.5ppd to 1ppd & will try to decr to 1/2ppd; we discussed this & I stressed the need to quit completely!    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  Weight loss/ underweight/ poor appetite> He says his norm wt was ~150# and he has lost down to 100#; we reviewed nutrition, advised 3 meals + Ensure/ Sustacal/ Boost daily... EXAM showed Afeb, VSS w/ BP=150/92, O2sat=96% on RA at rest; Wt=100#, 5'7"Tall, BMI=16;  HEENT- neg, mallampati1; Chest- decr BS bilat w/ prolonged exp phase & congested cough; Heart- RR w/o m/r/g; Abd- thin, soft, sl tender to palp +abd bruits; Ext- w/o c/c/e but pulses are diminished distally + left femoral bruit; Neuro- no focal neuro deficits...  CXR 03/26/17 (independently reviewed by me in the PACS system)> he did not go to our [place] for this requested CXR...  Ambulatory Oximetry on RA 03/26/17> O2sat=96% on RA at rest w/ pulse=105/min;  He ambulated only  2 laps in the office (185'ea) w/ lowest O2sat=96% w/ pulse=107/min;  He stopped due to leg weakness & pain...  LABS 03/26/17>  Chems- wnl w/ Cr=0.59, norm LFTs;  CBC- wnl w/ Hg=16.3;  TSH=0.59;  BNP=25;  Sed=2...  CT Chest/ Abd/ Pelvis 04/02/17> norm heart size, aortic atherosclerosis but ?no coronary calcif noted; no adenopathy & esoph is ok; diffuse bronchial wall thickening & mod centrilob+paraseptal emphysema; mult small pulm nodules scat bilaterally similar & w/o change from prev scans 2016&2017 (largest RML= 6x65m), no suspicious masses or consolidative airsp dis;  In the Abd/Pelvis small calcif granuloma in liver, otherw neg hepatobiliart tree, GB looks ok, pancreas ok, adrenals/ kidneys/ bladder- all ok; Aortic atherosclerosis w/ fusiform ectasia of infrarenal AA up to 2.6x2.3cm w/ abundant mural thrombus & complete occlusion of the right common iliac art & reconstitution of flow distally, +severe stenosis of prox left common iliac art; prostate ok & no bone lesions identified...  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) and is still smoking w/ a 70-80 pack yr hx, proud of the fact that he has cut back by a few cigarettes, refuses Chantix etc;  Asked to quit completely ASAP (says he'll try to decr to 10 cig/d) & continue resp treatments w/ Symbicort160-2spBid, Spiriva Respimat-2sp daily, Mucinex & Proair;  He is most concerned about his progressive leg symptoms=> clearly related to his severe peripheral vasc dis (ectatic distal Ao w/ mural thrombus, bilat common iliac dis, etc) and he is in need of ASAP eval by [facility] where arteriography will hopefully point to bypass surg that will help his symptoms;  In addition his weight loss & prot-cal malnutrition is alarming- thankfully his labs are essentially wnl & we reviewed nutritional supplements w/ ensure/ sustacal/ carnation instant/ etc...  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 Glendale Chard) --- PMHx, SurgHx, [facility], and SocHx per PCP is reviewed... Limited english hampers medical history>  He is [place] & speaks little english-- we used a Optometrist over the speaker phone to communicate w/ him... (call interpretor line at [phone] to set this up & if he is at home a 3-way conf call can be set up using his home phone [phone])    Outpatient Encounter Prescriptions as of 03/26/2017  Medication Sig  . Albuterol Sulfate (PROAIR RESPICLICK) 768 (90 Base) MCG/ACT AEPB Inhale 2 puffs into the lungs every 6 (six) hours as needed.  . budesonide-formoterol (SYMBICORT) 160-4.5 MCG/ACT inhaler Inhale 2 puffs into the lungs 2 (two) times daily.  . Tiotropium Bromide Monohydrate (SPIRIVA RESPIMAT) 2.5 MCG/ACT AERS Inhale 2 puffs into the lungs daily.   No facility-administered encounter medications on file as of 03/26/2017.     No Known Allergies   Current Medications, Allergies, Past Medical History, Past Surgical History, Family History, and Social History were reviewed in [employer] record.   Review of Systems            All symptoms NEG except where BOLDED >>  Constitutional:  F/C/S, fatigue, anorexia, unexpected weight change. HEENT:  HA, visual changes, hearing loss, earache, nasal symptoms, sore throat, mouth sores, hoarseness. Resp:  cough, sputum, hemoptysis; SOB, tightness, wheezing. Cardio:  CP, palpit, DOE, orthopnea, edema; leg weakness & pain w/ ambulation GI:  N/V/D/C, blood in stool; reflux, abd pain, distention, gas. GU:  dysuria, freq, urgency, hematuria, flank pain, voiding difficulty. MS:  joint pain, swelling,  tenderness, decr ROM; neck pain, back pain, etc. Neuro:  HA, tremors, seizures, dizziness, syncope, weakness, numbness, gait abn. Skin:  suspicious lesions or skin rash. Heme:  adenopathy, bruising, bleeding. Psyche:  confusion, agitation, sleep disturbance, hallucinations, anxiety, depression suicidal.   Objective:   Physical Exam      Vital Signs:  Reviewed...   General:  WD, cachectic, 61 y/o WM in NAD; alert & oriented; pleasant & cooperative... HEENT:  [place]/AT; Conjunctiva- pink, Sclera- nonicteric, EOM-wnl, PERRLA, EACs-clear, TMs-wnl; NOSE-clear; THROAT-clear & wnl.  Neck:  Supple w/ fair ROM; no JVD; normal carotid impulses w/o bruits; no thyromegaly or nodules palpated; no lymphadenopathy.  Chest:  Decr BS bilat, unable to augment BS voluntarily, clear without wheezes or rales; +congested cough/ rhonchi bilat Heart:  Epig PMI, Regular Rhythm; norm S1 & S2 without murmurs, rubs, or gallops detected. Abdomen:  Thin, soft & nontender- no guarding or rebound; normal bowel sounds; no organomegaly or masses palpated; left abd/ groin bruit Ext:  Normal ROM; without deformities or arthritic changes; no varicose veins, venous insuffic, or edema; LE pulses absent Neuro:  No focal neuro deficits; sensory testing normal; gait normal & balance OK. Derm:  No lesions noted; no rash etc. Lymph:  No cervical, supraclavicular, axillary, or inguinal adenopathy palpated.   Assessment:      IMP >>   Severe COPD/ Emphysema> on Symbicort160-2spBid & Spiriva Respimat-2sp once daily, Mucinex and AlbutHFA rescue inhaler as needed;  SOB w/ ADLs at baseline...    Bilat small pulm nodules on CT chest 02/2015 + centrilobular emphysema>  Serial CT scans w/o change in small bilat pulm nodules 2016-2017-2018  Cigarette smoker> He  has been smoking regularly since age 89, est 80+pack-yr smoking hx, he is proud that he has decr fro 2.5ppd to 1ppd & he promises to cut to 10cig/d...    ASPVD> he has claudication  & vasc insuffic symptoms=> CT Abd w/ atherosclerotic changes=> fusiform ectasia of infrarenal AA up to 2.6x2.3cm w/ abundant mural thrombus & complete occlusion of the right common iliac art & reconstitution of flow distally, +severe stenosis of prox left common iliac art;  Pt referred to [facility] for ASAP eval...  Weight loss/ underweight/ poor appetite> He says his norm wt was ~150# and he has lost down to 100#; we reviewed nutrition, advised Ensure/ Sustacal/ Boost daily...  PLAN >>  02/16/15>  [name] has been a life long smoker & has paid a price for this behavior w/ severe emphysema; despite still working at [employer] w/ 4 daughters to support- he continues to smoke 1ppd @$5 per pack= #150/mo & I have appealed to his parental instincts to quit smoking completely but he is not optimistic that he will be able to do so & unwilling to purchase smoking cessation aides like Chantix, Nicotine replacement, etc... In addition to smoking cessation we have prescribed SYMBICORT160- 2spBid, & SPIRIVA daily, along w/ ProairHFA rescue inhaler for prn use (coupons given taking advantage of all known discount programs)... For completeness I have rec a CT Chest (esp in light of his wt loss) and his PCP may want to consider CT Abd & Pelvis as well... He is rec to start eating better, 3 meals/day, plus nutritional supplements Bid;  Finally he needs the full benefits for avail immunization programs=> Prevnar-13, Pneumovax-23, Flu shots, etc (we will leave this to his PCP to administer)...  03/30/15>  [name] is improved on the inhalers and we provided him w/ some samples and Rx but it is unclear if he will be able to afford them- discount cards & drug company programs have been provided...  08/02/15>   Pt has severe COPD/emphysema & still smoking 1ppd- must quit all smoking & take his Symbicort/ Spiriva regularly & AlbutHFA as needed; He can use MUCINEX600- 2Bid w/ fluids for thick phlegm. We plan ROV 4mo& will plan f/u CTChest  at that time 01/10/16>   He must quit all smoking; f/u CT Chest is pending; continue Symbicort/ Spiriva/ Proair... 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) and is still smoking w/ a 70-80 pack yr hx, proud of the fact that he has cut back by a few cigarettes, refuses Chantix etc;  Asked to quit completely ASAP (says he'll try to decr to 10 cig/d) & continue resp treatments w/ Symbicort160-2spBid, Spiriva Respimat-2sp daily, Mucinex & Proair;  He is most concerned about his progressive leg symptoms=> clearly related to his severe peripheral vasc dis (ectatic distal Ao w/ mural thrombus, bilat common iliac dis, etc) and he is in need of ASAP eval by [facility] where arteriography will hopefully point to bypass surg that will help his symptoms;  In addition his weight loss & prot-cal malnutrition is alarming- thankfully his labs are essentially wnl & we reviewed nutritional supplements w/ ensure/ sustacal/ carnation instant/ etc...       Plan:     Patient's Medications  New Prescriptions   No medications on file  Previous Medications   ALBUTEROL SULFATE (PROAIR RESPICLICK) 1923(90 BASE) MCG/ACT AEPB    Inhale 2 puffs into the lungs every 6 (six) hours as needed.   BUDESONIDE-FORMOTEROL (SYMBICORT) 160-4.5 MCG/ACT INHALER  Inhale 2 puffs into the lungs 2 (two) times daily.   TIOTROPIUM BROMIDE MONOHYDRATE (SPIRIVA RESPIMAT) 2.5 MCG/ACT AERS    Inhale 2 puffs into the lungs daily.  Modified Medications   No medications on file  Discontinued Medications   No medications on file

## 2017-03-26 NOTE — Patient Instructions (Signed)
Today we updated your med list in our EPIC system...    Continue your current medications the same...  We refilled your SYMBICORT160- 2sprays twice daily... We refilled your Spiriva Respimat- 2 sprays once daily... We refilled your PROAIR Respiclick- 1-2 sprays every 6h as needed for wheezing...  You have very severe COPD from smoking-- you MUST work on smoking cessation!!! I am concerned that you have atherosclerotic disease (hardening of the arteries) in your legs>>    Today we did a follow up CXR, Blood work, and a walking test to check your oxygen...    We will arrange for an outpatient CT scan of your chest & abdomen...    We will arrange for a circulation test of your legs to check the blood flow...   When these results come back-- we will set up a conference call with the interpreter.Marland KitchenMarland Kitchen

## 2017-04-02 ENCOUNTER — Ambulatory Visit (INDEPENDENT_AMBULATORY_CARE_PROVIDER_SITE_OTHER)
Admission: RE | Admit: 2017-04-02 | Discharge: 2017-04-02 | Disposition: A | Discharge status: Home / Self Care | Payer: BLUE CROSS/BLUE SHIELD | Source: Ambulatory Visit | Attending: Pulmonary Disease | Admitting: Pulmonary Disease

## 2017-04-02 DIAGNOSIS — R634 Abnormal weight loss: Secondary | ICD-10-CM | POA: Diagnosis not present

## 2017-04-02 DIAGNOSIS — R9389 Abnormal findings on diagnostic imaging of other specified body structures: Secondary | ICD-10-CM

## 2017-04-02 DIAGNOSIS — R938 Abnormal findings on diagnostic imaging of other specified body structures: Secondary | ICD-10-CM

## 2017-04-02 MED ORDER — IOPAMIDOL (ISOVUE-300) INJECTION 61%
100.0000 mL | Freq: Once | INTRAVENOUS | Status: AC | PRN
  Administered 2017-04-02: 100 mL via INTRAVENOUS

## 2017-04-05 ENCOUNTER — Other Ambulatory Visit: Payer: Self-pay | Admitting: Pulmonary Disease

## 2017-04-05 DIAGNOSIS — R531 Weakness: Secondary | ICD-10-CM

## 2017-04-19 ENCOUNTER — Ambulatory Visit (HOSPITAL_COMMUNITY)
Admission: RE | Admit: 2017-04-19 | Discharge: 2017-04-19 | Disposition: A | Discharge status: Home / Self Care | Payer: BLUE CROSS/BLUE SHIELD | Source: Ambulatory Visit | Attending: Cardiovascular Disease | Admitting: Cardiovascular Disease

## 2017-04-19 DIAGNOSIS — R531 Weakness: Secondary | ICD-10-CM | POA: Insufficient documentation

## 2017-04-19 DIAGNOSIS — I739 Peripheral vascular disease, unspecified: Secondary | ICD-10-CM | POA: Diagnosis present

## 2017-04-19 DIAGNOSIS — R9389 Abnormal findings on diagnostic imaging of other specified body structures: Secondary | ICD-10-CM | POA: Insufficient documentation

## 2017-04-26 NOTE — Progress Notes (Signed)
Requested by:  Dr. Teressa Lower, MD  Reason for consultation: bilateral leg pain   History of Present Illness   Bobby Caldwell is a 61 y.o. 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  History is obtained via Optometrist.  Onset of symptom occurred years ago without obvious trigger.  Pain is described as "nail sticking" from thigh down to legs, severity 3-6/10, and associated with walking short distance.  Patient has attempted to treat this pain with rest and OTC.  This patient works at [employer] around, so his sx interfere with work.  The patient has intermittent rest pain symptoms also and no leg wounds/ulcers.  Atherosclerotic risk factors include: heavy smoker.  He baseline has significant SOB related to COPD  Past Medical History: Emphysema Nicotine abuse  Past Surgical History: none   Social History   Social History  . Marital status: Single    Spouse name: N/A  . Number of children: N/A  . Years of education: N/A   Occupational History  . Not on file.   Social History Main Topics  . Smoking status: Current Every Day Smoker    Packs/day: 0.50    Years: 50.00    Types: Cigarettes  . Smokeless tobacco: Never Used  . Alcohol use Not on file  . Drug use: Unknown  . Sexual activity: Not on file   Other Topics Concern  . Not on file   Social History Narrative  . No narrative on file   Family History: patient is unable to detail the medical history of his parents  Current Outpatient Prescriptions  Medication Sig Dispense Refill  . Albuterol Sulfate (PROAIR RESPICLICK) 614 (90 Base) MCG/ACT AEPB Inhale 2 puffs into the lungs every 6 (six) hours as needed. 1 each 6  . budesonide-formoterol (SYMBICORT) 160-4.5 MCG/ACT inhaler Inhale 2 puffs into the lungs 2 (two) times daily. 1 Inhaler 6  . Tiotropium Bromide Monohydrate (SPIRIVA RESPIMAT) 2.5 MCG/ACT AERS Inhale 2 puffs into the lungs daily. 1 Inhaler 6   No  current facility-administered medications for this visit.     No Known Allergies  REVIEW OF SYSTEMS (negative unless checked):   Cardiac:  []  Chest pain or chest pressure? [x]  Shortness of breath upon activity? []  Shortness of breath when lying flat? []  Irregular heart rhythm?  Vascular:  [x]  Pain in calf, thigh, or hip brought on by walking? [x]  Pain in feet at night that wakes you up from your sleep? []  Blood clot in your veins? []  Leg swelling?  Pulmonary:  []  Oxygen at home? []  Productive cough? []  Wheezing?  Neurologic:  []  Sudden weakness in arms or legs? []  Sudden numbness in arms or legs? []  Sudden onset of difficult speaking or slurred speech? []  Temporary loss of vision in one eye? []  Problems with dizziness?  Gastrointestinal:  []  Blood in stool? []  Vomited blood?  Genitourinary:  []  Burning when urinating? []  Blood in urine?  Psychiatric:  []  Major depression  Hematologic:  []  Bleeding problems? []  Problems with blood clotting?  Dermatologic:  []  Rashes or ulcers?  Constitutional:  []  Fever or chills?  Ear/Nose/Throat:  []  Change in hearing? []  Nose bleeds? []  Sore throat?  Musculoskeletal:  []  Back pain? []  Joint pain? []  Muscle pain?   For VQI Use Only   PRE-ADM LIVING Home  AMB STATUS Ambulatory  CAD Sx None  PRIOR CHF None  STRESS TEST No    Physical Examination  Vitals:   04/27/17 1112  BP: (!) 146/100  Pulse: 81  Resp: 16  Temp: 97.8 F (36.6 C)  TempSrc: Oral  SpO2: 95%  Weight: 109 lb (49.4 kg)  Height: 5\' 7"  (1.702 m)   Body mass index is 17.07 kg/m.  General Alert, O x 3, WD, Ill appearing, thin  Head [place]/AT,    Ear/Nose/ Throat Hearing grossly intact, nares without erythema or drainage, oropharynx without Erythema or Exudate, Mallampati score: 3,   Eyes PERRLA, EOMI,    Neck Supple, mid-line trachea,    Pulmonary Sym exp, good B air movt, CTA B  Cardiac RRR, Nl S1, S2, no Murmurs, No rubs, No  S3,S4  Vascular Vessel Right Left  Radial Palpable Palpable  Brachial Palpable Palpable  Carotid Palpable, No Bruit Palpable, No Bruit  Aorta Not palpable N/A  Femoral Not palpable Faintly palpable  Popliteal Not palpable Not palpable  PT Not palpable Not palpable  DP Not palpable Not palpable    Gastro- intestinal soft, non-distended, non-tender to palpation, No guarding or rebound, no HSM, no masses, no CVAT B, No palpable prominent aortic pulse,    Musculo- skeletal M/S 5/5 throughout  , Extremities without ischemic changes  , No edema present, Varicosities present: L>>R, No Lipodermatosclerosis present  Neurologic Cranial nerves 2-12 intact , Pain and light touch intact in extremities , Motor exam as listed above  Psychiatric Judgement intact, Mood & affect appropriate for pt's clinical situation  Dermatologic See M/S exam for extremity exam, No rashes otherwise noted  Lymphatic  Palpable lymph nodes: None    Non-Invasive Vascular imaging   Outside BLE ABI (04/19/17)  R:   ABI: 0.46,   PT: mono  DP: mono   Pero: mono  TBI:  0.32  L:   ABI: 0.53,   PT: mono  DP: mono   Pero: Mono  TBI: 0.47   Radiology     CT Abd/pelvis (04/02/17): 1. Aortic atherosclerosis with fusiform ectasia of the infrarenal abdominal aorta which measures up to 2.6 x 2.3 cm, and contains a large burden of mural thrombus. 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 2. Today's study was not a CTA examination, which limits assessment of vascular abnormalities. Additionally, secondary to extensive patient respiratory motion, accurate assessment of small vessels is limited. With these limitations in mind, there is potential stenosis of both the origin of the celiac axis and the origin of the inferior mesenteric artery. Given the patient's history of intermittent abdominal pain, further evaluation  with CTA of the abdomen and pelvis should be considered if clinically appropriate, and if the patient can be counseled to adequately hold his breath during the examination to allow for optimal image quality. 3. No other acute findings are noted in the chest, abdomen or pelvis to account for the patient's symptoms. 4. Diffuse bronchial wall thickening with moderate centrilobular and paraseptal emphysema; imaging findings suggestive of underlying COPD. 5. Multiple small pulmonary nodules measuring up to 6 mm, stable compared to prior examinations dating back take 02/22/2015, considered benign. No future follow-up needed. This recommendation follows the consensus statement: Guidelines for Management of Incidental Pulmonary Nodules Detected on CT Images: From the Fleischner Society 2017; Radiology 2017; 284:228-243. 6. Additional incidental findings, as above. Aortic Atherosclerosis (ICD10-I70.0) and Emphysema (ICD10-J43.9).   Outside Studies/Documentation   5 pages of outside documents were reviewed including: outpatient PCP chart    Medical Decision Making  Bobby Caldwell is a 61 y.o. male who presents with: occupation limiting R>>L intermittent claudication, known R CIA occlusion, L CIA stenosis, COPD with emphysema   Based on this patient's history and physical exam, I recommend: aortogram, bilateral runoff, and possible intervention  I discussed with the patient the nature of angiographic procedures, especially the limited patencies of any endovascular intervention.   The patient is aware of that the risks of an angiographic procedure include but are not limited to: bleeding, infection, access site complications, renal failure, embolization, rupture of vessel, dissection, arteriovenous fistula, possible need for emergent surgical intervention, possible need for surgical procedures to treat the patient's pathology, anaphylactic reaction to contrast, and stroke and death.   The patient  is aware of the risks and agrees to proceed. The patient is scheduled for the 25 OCT 18 The patient is currently not on on statin as not medically indicated.  The patient is currently not on an anti-platelet. Patient will be started on ASA 81 mg PO daily  Thank you for allowing [place] to participate in this patient's care.   Adele Barthel, MD, FACS [facility] [place] Office: [phone] Pager: [phone]  04/26/2017, 8:22 PM

## 2017-04-27 ENCOUNTER — Encounter: Payer: Self-pay | Admitting: Vascular Surgery

## 2017-04-27 ENCOUNTER — Encounter: Payer: Self-pay | Admitting: *Deleted

## 2017-04-27 ENCOUNTER — Other Ambulatory Visit: Payer: Self-pay | Admitting: *Deleted

## 2017-04-27 ENCOUNTER — Ambulatory Visit (INDEPENDENT_AMBULATORY_CARE_PROVIDER_SITE_OTHER): Payer: BLUE CROSS/BLUE SHIELD | Admitting: Vascular Surgery

## 2017-04-27 VITALS — BP 146/100 | HR 81 | Temp 97.8°F | Resp 16 | Ht 67.0 in | Wt 109.0 lb

## 2017-04-27 DIAGNOSIS — I70213 Atherosclerosis of native arteries of extremities with intermittent claudication, bilateral legs: Secondary | ICD-10-CM

## 2017-05-03 ENCOUNTER — Observation Stay (HOSPITAL_COMMUNITY)
Admission: RE | Admit: 2017-05-03 | Discharge: 2017-05-04 | Disposition: A | Discharge status: Home / Self Care | Payer: BLUE CROSS/BLUE SHIELD | Source: Ambulatory Visit | Attending: Vascular Surgery | Admitting: Vascular Surgery

## 2017-05-03 ENCOUNTER — Encounter (HOSPITAL_COMMUNITY): Payer: Self-pay

## 2017-05-03 ENCOUNTER — Encounter (HOSPITAL_COMMUNITY)
Admission: RE | Disposition: A | Discharge status: Home / Self Care | Payer: Self-pay | Source: Ambulatory Visit | Attending: Vascular Surgery

## 2017-05-03 ENCOUNTER — Telehealth: Payer: Self-pay | Admitting: Vascular Surgery

## 2017-05-03 DIAGNOSIS — I700 Atherosclerosis of aorta: Secondary | ICD-10-CM | POA: Insufficient documentation

## 2017-05-03 DIAGNOSIS — E1151 Type 2 diabetes mellitus with diabetic peripheral angiopathy without gangrene: Secondary | ICD-10-CM | POA: Diagnosis not present

## 2017-05-03 DIAGNOSIS — I70229 Atherosclerosis of native arteries of extremities with rest pain, unspecified extremity: Secondary | ICD-10-CM | POA: Diagnosis present

## 2017-05-03 DIAGNOSIS — J449 Chronic obstructive pulmonary disease, unspecified: Secondary | ICD-10-CM | POA: Diagnosis not present

## 2017-05-03 DIAGNOSIS — I998 Other disorder of circulatory system: Secondary | ICD-10-CM | POA: Insufficient documentation

## 2017-05-03 DIAGNOSIS — I70213 Atherosclerosis of native arteries of extremities with intermittent claudication, bilateral legs: Secondary | ICD-10-CM | POA: Insufficient documentation

## 2017-05-03 DIAGNOSIS — I70223 Atherosclerosis of native arteries of extremities with rest pain, bilateral legs: Secondary | ICD-10-CM | POA: Diagnosis not present

## 2017-05-03 DIAGNOSIS — Z7951 Long term (current) use of inhaled steroids: Secondary | ICD-10-CM | POA: Diagnosis not present

## 2017-05-03 DIAGNOSIS — F17210 Nicotine dependence, cigarettes, uncomplicated: Secondary | ICD-10-CM | POA: Diagnosis not present

## 2017-05-03 DIAGNOSIS — I70219 Atherosclerosis of native arteries of extremities with intermittent claudication, unspecified extremity: Secondary | ICD-10-CM

## 2017-05-03 HISTORY — PX: ABDOMINAL AORTOGRAM W/LOWER EXTREMITY: CATH118223

## 2017-05-03 HISTORY — PX: PERIPHERAL VASCULAR INTERVENTION: CATH118257

## 2017-05-03 HISTORY — DX: Tobacco use: Z72.0

## 2017-05-03 HISTORY — DX: Peripheral vascular disease, unspecified: I73.9

## 2017-05-03 HISTORY — DX: Chronic obstructive pulmonary disease, unspecified: J44.9

## 2017-05-03 LAB — POCT I-STAT, CHEM 8
BUN: 11 mg/dL (ref 6–20)
CHLORIDE: 99 mmol/L — AB (ref 101–111)
CREATININE: 0.5 mg/dL — AB (ref 0.61–1.24)
Calcium, Ion: 1.1 mmol/L — ABNORMAL LOW (ref 1.15–1.40)
Glucose, Bld: 81 mg/dL (ref 65–99)
HEMATOCRIT: 46 % (ref 39.0–52.0)
Hemoglobin: 15.6 g/dL (ref 13.0–17.0)
POTASSIUM: 4.6 mmol/L (ref 3.5–5.1)
SODIUM: 139 mmol/L (ref 135–145)
TCO2: 32 mmol/L (ref 22–32)

## 2017-05-03 LAB — CBC
HEMATOCRIT: 41.6 % (ref 39.0–52.0)
HEMOGLOBIN: 14.3 g/dL (ref 13.0–17.0)
MCH: 30.3 pg (ref 26.0–34.0)
MCHC: 34.4 g/dL (ref 30.0–36.0)
MCV: 88.1 fL (ref 78.0–100.0)
Platelets: 170 10*3/uL (ref 150–400)
RBC: 4.72 MIL/uL (ref 4.22–5.81)
RDW: 13.6 % (ref 11.5–15.5)
WBC: 10.6 10*3/uL — AB (ref 4.0–10.5)

## 2017-05-03 LAB — POCT ACTIVATED CLOTTING TIME
ACTIVATED CLOTTING TIME: 186 s
ACTIVATED CLOTTING TIME: 202 s
ACTIVATED CLOTTING TIME: 169 s
Activated Clotting Time: 219 seconds

## 2017-05-03 LAB — CREATININE, SERUM: Creatinine, Ser: 0.58 mg/dL — ABNORMAL LOW (ref 0.61–1.24)

## 2017-05-03 SURGERY — ABDOMINAL AORTOGRAM W/LOWER EXTREMITY
Anesthesia: LOCAL

## 2017-05-03 MED ORDER — SENNA 8.6 MG PO TABS
1.0000 | ORAL_TABLET | Freq: Two times a day (BID) | ORAL | Status: DC
  Administered 2017-05-04: 8.6 mg via ORAL
  Filled 2017-05-03 (×2): qty 1

## 2017-05-03 MED ORDER — SODIUM CHLORIDE 0.9 % IV SOLN
INTRAVENOUS | Status: DC
  Administered 2017-05-03: 06:00:00 via INTRAVENOUS

## 2017-05-03 MED ORDER — HEPARIN SODIUM (PORCINE) 1000 UNIT/ML IJ SOLN
INTRAMUSCULAR | Status: DC | PRN
  Administered 2017-05-03: 5000 [IU] via INTRAVENOUS

## 2017-05-03 MED ORDER — SODIUM CHLORIDE 0.9 % WEIGHT BASED INFUSION: 1.0000 mL/kg/h | INTRAVENOUS | Status: AC

## 2017-05-03 MED ORDER — MORPHINE SULFATE (PF) 4 MG/ML IV SOLN: 2.0000 mg | INTRAVENOUS | Status: DC | PRN

## 2017-05-03 MED ORDER — MIDAZOLAM HCL 2 MG/2ML IJ SOLN
INTRAMUSCULAR | Status: DC | PRN
  Administered 2017-05-03: 1 mg via INTRAVENOUS

## 2017-05-03 MED ORDER — ENOXAPARIN SODIUM 40 MG/0.4ML ~~LOC~~ SOLN
40.0000 mg | SUBCUTANEOUS | Status: DC
  Filled 2017-05-03: qty 0.4

## 2017-05-03 MED ORDER — LIDOCAINE HCL 2 % IJ SOLN
INTRAMUSCULAR | Status: DC | PRN
  Administered 2017-05-03: 20 mL

## 2017-05-03 MED ORDER — HYDRALAZINE HCL 20 MG/ML IJ SOLN: 5.0000 mg | INTRAMUSCULAR | Status: DC | PRN

## 2017-05-03 MED ORDER — CLOPIDOGREL BISULFATE 75 MG PO TABS: 75.0000 mg | ORAL_TABLET | Freq: Every day | ORAL | 11 refills | Status: DC

## 2017-05-03 MED ORDER — CLOPIDOGREL BISULFATE 300 MG PO TABS
ORAL_TABLET | ORAL | Status: AC
  Filled 2017-05-03: qty 1

## 2017-05-03 MED ORDER — OXYCODONE-ACETAMINOPHEN 5-325 MG PO TABS: 1.0000 | ORAL_TABLET | Freq: Four times a day (QID) | ORAL | Status: DC | PRN

## 2017-05-03 MED ORDER — ACETAMINOPHEN 325 MG RE SUPP
325.0000 mg | RECTAL | Status: DC | PRN
  Filled 2017-05-03: qty 2

## 2017-05-03 MED ORDER — CLOPIDOGREL BISULFATE 75 MG PO TABS
300.0000 mg | ORAL_TABLET | Freq: Once | ORAL | Status: AC
  Administered 2017-05-03: 300 mg via ORAL

## 2017-05-03 MED ORDER — HEPARIN SODIUM (PORCINE) 1000 UNIT/ML IJ SOLN
INTRAMUSCULAR | Status: AC
  Filled 2017-05-03: qty 1

## 2017-05-03 MED ORDER — CLOPIDOGREL BISULFATE 75 MG PO TABS
75.0000 mg | ORAL_TABLET | Freq: Every day | ORAL | Status: DC
  Administered 2017-05-03 – 2017-05-04 (×2): 75 mg via ORAL
  Filled 2017-05-03 (×2): qty 1

## 2017-05-03 MED ORDER — OXYCODONE HCL 5 MG PO TABS
ORAL_TABLET | ORAL | Status: AC
  Filled 2017-05-03: qty 2

## 2017-05-03 MED ORDER — MORPHINE SULFATE (PF) 10 MG/ML IV SOLN: 2.0000 mg | INTRAVENOUS | Status: DC | PRN

## 2017-05-03 MED ORDER — ALUM & MAG HYDROXIDE-SIMETH 200-200-20 MG/5ML PO SUSP: 15.0000 mL | ORAL | Status: DC | PRN

## 2017-05-03 MED ORDER — HYDRALAZINE HCL 20 MG/ML IJ SOLN
INTRAMUSCULAR | Status: AC
  Filled 2017-05-03: qty 1

## 2017-05-03 MED ORDER — SODIUM CHLORIDE 0.9% FLUSH
3.0000 mL | Freq: Two times a day (BID) | INTRAVENOUS | Status: DC
  Administered 2017-05-03: 3 mL via INTRAVENOUS

## 2017-05-03 MED ORDER — FENTANYL CITRATE (PF) 100 MCG/2ML IJ SOLN
INTRAMUSCULAR | Status: DC | PRN
  Administered 2017-05-03 (×2): 50 ug via INTRAVENOUS

## 2017-05-03 MED ORDER — SODIUM CHLORIDE 0.9 % IV SOLN
INTRAVENOUS | Status: DC
  Administered 2017-05-03 – 2017-05-04 (×2): via INTRAVENOUS

## 2017-05-03 MED ORDER — IODIXANOL 320 MG/ML IV SOLN
INTRAVENOUS | Status: DC | PRN
  Administered 2017-05-03: 245 mL via INTRA_ARTERIAL

## 2017-05-03 MED ORDER — HYDRALAZINE HCL 20 MG/ML IJ SOLN
5.0000 mg | INTRAMUSCULAR | Status: DC | PRN
  Administered 2017-05-03: 5 mg via INTRAVENOUS

## 2017-05-03 MED ORDER — FENTANYL CITRATE (PF) 100 MCG/2ML IJ SOLN
INTRAMUSCULAR | Status: AC
  Filled 2017-05-03: qty 2

## 2017-05-03 MED ORDER — POLYETHYLENE GLYCOL 3350 17 G PO PACK: 17.0000 g | PACK | Freq: Every day | ORAL | Status: DC | PRN

## 2017-05-03 MED ORDER — SODIUM CHLORIDE 0.9 % IV SOLN: 250.0000 mL | INTRAVENOUS | Status: DC | PRN

## 2017-05-03 MED ORDER — BISACODYL 10 MG RE SUPP: 10.0000 mg | Freq: Every day | RECTAL | Status: DC | PRN

## 2017-05-03 MED ORDER — SODIUM CHLORIDE 0.9% FLUSH: 3.0000 mL | INTRAVENOUS | Status: DC | PRN

## 2017-05-03 MED ORDER — OXYCODONE HCL 5 MG PO TABS
5.0000 mg | ORAL_TABLET | Freq: Four times a day (QID) | ORAL | Status: DC | PRN
  Administered 2017-05-03: 10 mg via ORAL

## 2017-05-03 MED ORDER — HEPARIN (PORCINE) IN NACL 2-0.9 UNIT/ML-% IJ SOLN
INTRAMUSCULAR | Status: AC | PRN
  Administered 2017-05-03: 1000 mL

## 2017-05-03 MED ORDER — ACETAMINOPHEN 325 MG PO TABS: 325.0000 mg | ORAL_TABLET | ORAL | Status: DC | PRN

## 2017-05-03 MED ORDER — HEPARIN (PORCINE) IN NACL 2-0.9 UNIT/ML-% IJ SOLN
INTRAMUSCULAR | Status: AC
  Filled 2017-05-03: qty 1000

## 2017-05-03 MED ORDER — MIDAZOLAM HCL 2 MG/2ML IJ SOLN
INTRAMUSCULAR | Status: AC
  Filled 2017-05-03: qty 2

## 2017-05-03 MED ORDER — GUAIFENESIN-DM 100-10 MG/5ML PO SYRP: 15.0000 mL | ORAL_SOLUTION | ORAL | Status: DC | PRN

## 2017-05-03 MED ORDER — ONDANSETRON HCL 4 MG/2ML IJ SOLN: 4.0000 mg | Freq: Four times a day (QID) | INTRAMUSCULAR | Status: DC | PRN

## 2017-05-03 MED ORDER — FLEET ENEMA 7-19 GM/118ML RE ENEM: 1.0000 | ENEMA | Freq: Once | RECTAL | Status: DC | PRN

## 2017-05-03 MED ORDER — METOPROLOL TARTRATE 5 MG/5ML IV SOLN: 2.0000 mg | INTRAVENOUS | Status: DC | PRN

## 2017-05-03 MED ORDER — LIDOCAINE HCL 2 % IJ SOLN
INTRAMUSCULAR | Status: AC
  Filled 2017-05-03: qty 20

## 2017-05-03 MED ORDER — LABETALOL HCL 5 MG/ML IV SOLN: 10.0000 mg | INTRAVENOUS | Status: DC | PRN

## 2017-05-03 MED ORDER — GUAIFENESIN-CODEINE 100-10 MG/5ML PO SOLN
5.0000 mL | Freq: Once | ORAL | Status: AC
  Administered 2017-05-03: 5 mL via ORAL
  Filled 2017-05-03: qty 5

## 2017-05-03 MED ORDER — GUAIFENESIN-CODEINE 100-10 MG/5ML PO SOLN
ORAL | Status: AC
  Filled 2017-05-03: qty 5

## 2017-05-03 SURGICAL SUPPLY — 14 items
BALLN ARMADA 10X20X80 (BALLOONS) ×3
BALLOON ARMADA 10X20X80 (BALLOONS) ×2 IMPLANT
CATH OMNI FLUSH 5F 65CM (CATHETERS) ×3 IMPLANT
KIT ENCORE 26 ADVANTAGE (KITS) ×3 IMPLANT
KIT MICROINTRODUCER STIFF 5F (SHEATH) ×3 IMPLANT
KIT PV (KITS) ×3 IMPLANT
SHEATH BRITE TIP 8FR 35CM (SHEATH) ×3 IMPLANT
SHEATH PINNACLE 5F 10CM (SHEATH) ×3 IMPLANT
STENT VIABAHN 9X39X80 VBX (Permanent Stent) ×3 IMPLANT
SYR MEDRAD MARK V 150ML (SYRINGE) ×3 IMPLANT
TRANSDUCER W/STOPCOCK (MISCELLANEOUS) ×3 IMPLANT
TRAY PV CATH (CUSTOM PROCEDURE TRAY) ×3 IMPLANT
WIRE BENTSON .035X145CM (WIRE) ×3 IMPLANT
WIRE ROSEN-J .035X180CM (WIRE) ×3 IMPLANT

## 2017-05-03 NOTE — Discharge Instructions (Signed)

## 2017-05-03 NOTE — Telephone Encounter (Signed)
Pt saw their pulmonologist Dr. Lenna Gilford on 03/26/17, faxed them a clearance form to [phone]. Sched a cardiac clearance appt on 05/08/17 at 11:00. Sched [place] 05/25/17 at 9:15. Called pt's hm#, the person who picked up the phone was unable to take a message, mailed pt appt letter through regular mail.

## 2017-05-03 NOTE — Op Note (Signed)
OPERATIVE NOTE   PROCEDURE: 1.  Right common femoral artery cannulation under ultrasound guidance 2.  Placement of catheter in aorta 3.  Aortogram 4.  Left leg runoff via catheter 5.  Right leg runoff via catheter 6.  Left common iliac artery angioplasty and stenting (9 mm x 39 mm) 7.  Conscious sedation for 55 minutes  PRE-OPERATIVE DIAGNOSIS: bilateral intermittent claudication with intermittent rest pain  POST-OPERATIVE DIAGNOSIS: same as above   SURGEON: Adele Barthel, MD  ANESTHESIA: conscious sedation  ESTIMATED BLOOD LOSS: 50 cc  CONTRAST: 245 cc  FINDING(S):  Aorta: patent, flow eddy in suprarenal segment (unclear etiology)  Superior mesenteric artery: patent, branches feed marginal branch, branches also help perfuse right leg, minimal proximal stenosis <30% Celiac artery: occluded   Right Left  RA patent patent  CIA Flush occlusion, calcified wall faintly evident Patent, >90% stenosis in proximal segment: resolved with stenting  EIA Occluded, calcified wall faintly evident Patent, undersized  IIA Occluded Patent  CFA Distal reconstitutes faintly via pelvic collaterals Patent, undersized likely due to poor inflow  SFA Faintly imaged due to poor inflow, likely patent Patent, undersized likely due to poor inflow  PFA Faintly imaged due to poor inflow, likely patent Patent, undersized likely due to poor inflow  Pop Faintly imaged due to poor inflow, likely patent Patent, undersized likely due to poor inflow  Trif Faintly imaged due to poor inflow, likely patent Patent, undersized likely due to poor inflow  AT Faintly imaged due to poor inflow, likely patent Patent, undersized likely due to poor inflow  Pero Faintly imaged due to poor inflow, likely patent Patent, undersized likely due to poor inflow  PT Faintly imaged due to poor inflow, likely patent Patent, undersized likely due to poor inflow   SPECIMEN(S):  none  INDICATIONS:   Bobby Caldwell is a 61 y.o. male  who presents with occupation limiting intermittent claudication with intermittent rest pain.  The patient presents for: aortogram, bilateral leg runoff, and possible intervention.  I discussed with the patient the nature of angiographic procedures, especially the limited patencies of any endovascular intervention.  The patient is aware of that the risks of an angiographic procedure include but are not limited to: bleeding, infection, access site complications, renal failure, embolization, rupture of vessel, dissection, possible need for emergent surgical intervention, possible need for surgical procedures to treat the patient's pathology, and stroke and death.  The patient is aware of the risks and agrees to proceed.  DESCRIPTION: After full informed consent was obtained from the patient, the patient was brought back to the angiography suite.  The patient was placed supine upon the angiography table and connected to cardiopulmonary monitoring equipment.  The patient was then given conscious sedation, the amounts of which are documented in the patient's chart.  A circulating radiologic technician maintained continuous monitoring of the patient's cardiopulmonary status.  Additionally, the control room radiologic technician provided backup monitoring throughout the procedure.  The patient was prepped and drape in the standard fashion for an angiographic procedure.  At this point, attention was turned to the right groin.  Under ultrasound guidance, the subcutaneous tissue surrounding the left common femoral artery was anesthesized with 1% lidocaine with epinephrine.  The artery was then cannulated with a micropuncture needle.  The microwire was advanced into the iliac arterial system.  The needle was exchanged for a microsheath, which was loaded into the common femoral artery over the wire.  The microwire was exchanged for a Bentson wire which  was advanced into the aorta.  The microsheath was then exchanged for a  5-Fr sheath which was loaded into the common femoral artery.  The Omniflush catheter was then loaded over the wire up to the level of L1.  The catheter was connected to the power injector circuit.  After de-airring and de-clotting the circuit, a power injector aortogram was completed.  The findings are listed above.  I pulled the catheter down to the distal aorta and an automated bilateral leg runoff was done.  This failed to image any flow in the right leg due to the lack of collaterals in the distal aorta.  The left leg runoff is listed above.  I then pushed the catheter in the suprarenal segment and repeated the injection.  The findings are listed above.  At this point, I felt intervention on the left common iliac artery was necessary to optimize inflow for a femorofemoral graft.  Due this patient's significant COPD, I think he would have problem post-operatively after an aortobifemoral bypass.   I placed a Rosen wire up the catheter and then removed the catheter.  Based on the images, I selected a 9 mm x 39 mm VBX covered stent.    The left sheath was exchanged for a 8-Fr Brite tip long sheath was advanced into the distal aorta.  The patient was given 5000 units of Heparin intravenously, which was a therapeutic bolus. Just placing this sheath was obturating the left iliac arterial flow as evident by the patient's increase in pain.  I then placed the VBX stent through the sheath and pulled the sheath back.  I position the tip of the VBX 4-5 mm into the distal aorta.  I deployed the stent at 12 ATM for 30 sec.  The balloon was deflated and removed.  I tried to do a hand injection but the improved antegrade blood flow prevent a good image of the distal aorta.  I replaced the Omniflush catheter into the suprarenal aorta.  A lateral aortic injection was completed to image the celiac artery.  This demonstrated occlusion of the celiac artery and nothing to account for the flow eddies in the suprarenal aorta.  I  then did a pelvic injection from this position which demonstrated resolution of the stenosis in the left common iliac artery but some inadequate wall apposition, so I placed a 10 mm x 20 mm balloon over the wire and positioned it with 1/3 of length into the aorta.  I inflated at 10 ATM for 1 minute to flare the proximal stent.  The balloon was deflated and removed.  I replaced the catheter and another completion angiogram was completed.  This demonstrated complete apposition of the proximal stent to the aorta and iliac orifice.  I replaced the wire into the catheter, straightening out the crook in the catheter.  Both were removed from the sheath together.  The sheath was pulled down into the left external iliac artery.  The sheath was aspirated.  No clots were present and the sheath was reloaded with heparinized saline.  The sheath will be removed in holding once the ACT normalizes.  Based on the completion images, I feel his left iliac arterial system is optimized for a femorofemoral graft.  He will need Pulmonary and Cardiology optimization.  He also needs some time to further dilate the left femoral arterial system to facilitate the left femorofemoral bypass anastomosis.   COMPLICATIONS: none  CONDITION: stable   Adele Barthel, MD, [facility] [place] Office: [phone] Pager: [phone]  05/03/2017, 8:51 AM

## 2017-05-03 NOTE — Progress Notes (Signed)
Called to short stay to see patient after having a groin bleed while getting dressed to go home.  Got patient back in the bed and held pressure x 20 minutes.  Small hematoma expressed out now a level 0.  Redressed site with 2x2 and tegaderm.  Informed staff and patient that we would need to restart bedrest starting at 1500.  RN will continue to monitor site.

## 2017-05-03 NOTE — Telephone Encounter (Signed)
-----   Message from Mena Goes, RN sent at 05/03/2017  9:48 AM EDT ----- Regarding: referrals and disabilty then appt w/ [facility]    ----- Message ----- From: Conrad Tuttle, MD Sent: 05/03/2017   9:06 AM To: [facility] Charge Pool  Jeannie Done [id number] 08-17-1955  PROCEDURE: 1.  Right common femoral artery cannulation under ultrasound guidance 2.  Placement of catheter in aorta 3.  Aortogram 4.  Left leg runoff via catheter 5.  Right leg runoff via catheter 6.  Left common iliac artery angioplasty and stenting (9 mm x 39 mm) 7.  Conscious sedation for 55 minutes  Orders for follow-up: 1.  Needs help with disability paperwork and unable to work due to his R iliac arterial occlusion 2.  Needs to see Pulmonary for preop optimization for L to R fem-fem bypass 3.  Needs to see Cardiology for preop optimization for L to R fem-fem bypass  F/U: 2-4 weeks after all f/u completed

## 2017-05-03 NOTE — Progress Notes (Signed)
Translator--w/language resources, Bobby Caldwell present

## 2017-05-03 NOTE — Interval H&P Note (Signed)
   History and Physical Update  The patient was interviewed and re-examined.  The patient's previous History and Physical has been reviewed and is unchanged from my consult.  There is no change in the plan of care: aortogram, bilateral leg run off, and possible intervention.   I discussed with the patient the nature of angiographic procedures, especially the limited patencies of any endovascular intervention.    The patient is aware of that the risks of an angiographic procedure include but are not limited to: bleeding, infection, access site complications, renal failure, embolization, rupture of vessel, dissection, arteriovenous fistula, possible need for emergent surgical intervention, possible need for surgical procedures to treat the patient's pathology, anaphylactic reaction to contrast, and stroke and death.    The patient is aware of the risks and agrees to proceed.   Adele Barthel, MD, FACS [facility] [place] Office: [phone] Pager: [phone]  05/03/2017, 7:07 AM

## 2017-05-03 NOTE — Progress Notes (Signed)
Patient ready to leave to home with level zero groin after ambulating for > 10 minutes.  Patient complained of sudden groin pain just before getting into wheelchair.  Patient had hematoma about the size of a lime to left groin.  Pressure held to groin. IV access regained and fluids resumed per previous order.  Dr. Bridgett Larsson paged. Patients family notified. Groin currently at level zero and patient resting comfortably in bed at this time.  Patient currently in stable condition with vital signs being monitored.

## 2017-05-03 NOTE — H&P (View-Only) (Signed)
Requested by:  Dr. Teressa Lower, MD  Reason for consultation: bilateral leg pain   History of Present Illness   Bobby Caldwell is a 61 y.o. (12-Mar-1956) male with severe emphysema who presents with chief complaint: B leg pain.  History is obtained via Optometrist.  Onset of symptom occurred years ago without obvious trigger.  Pain is described as "nail sticking" from thigh down to legs, severity 3-6/10, and associated with walking short distance.  Patient has attempted to treat this pain with rest and OTC.  This patient works at [employer] around, so his sx interfere with work.  The patient has intermittent rest pain symptoms also and no leg wounds/ulcers.  Atherosclerotic risk factors include: heavy smoker.  He baseline has significant SOB related to COPD  Past Medical History: Emphysema Nicotine abuse  Past Surgical History: none   Social History   Social History  . Marital status: Single    Spouse name: N/A  . Number of children: N/A  . Years of education: N/A   Occupational History  . Not on file.   Social History Main Topics  . Smoking status: Current Every Day Smoker    Packs/day: 0.50    Years: 50.00    Types: Cigarettes  . Smokeless tobacco: Never Used  . Alcohol use Not on file  . Drug use: Unknown  . Sexual activity: Not on file   Other Topics Concern  . Not on file   Social History Narrative  . No narrative on file   Family History: patient is unable to detail the medical history of his parents  Current Outpatient Prescriptions  Medication Sig Dispense Refill  . Albuterol Sulfate (PROAIR RESPICLICK) 001 (90 Base) MCG/ACT AEPB Inhale 2 puffs into the lungs every 6 (six) hours as needed. 1 each 6  . budesonide-formoterol (SYMBICORT) 160-4.5 MCG/ACT inhaler Inhale 2 puffs into the lungs 2 (two) times daily. 1 Inhaler 6  . Tiotropium Bromide Monohydrate (SPIRIVA RESPIMAT) 2.5 MCG/ACT AERS Inhale 2 puffs into the lungs daily. 1 Inhaler 6   No  current facility-administered medications for this visit.     No Known Allergies  REVIEW OF SYSTEMS (negative unless checked):   Cardiac:  []  Chest pain or chest pressure? [x]  Shortness of breath upon activity? []  Shortness of breath when lying flat? []  Irregular heart rhythm?  Vascular:  [x]  Pain in calf, thigh, or hip brought on by walking? [x]  Pain in feet at night that wakes you up from your sleep? []  Blood clot in your veins? []  Leg swelling?  Pulmonary:  []  Oxygen at home? []  Productive cough? []  Wheezing?  Neurologic:  []  Sudden weakness in arms or legs? []  Sudden numbness in arms or legs? []  Sudden onset of difficult speaking or slurred speech? []  Temporary loss of vision in one eye? []  Problems with dizziness?  Gastrointestinal:  []  Blood in stool? []  Vomited blood?  Genitourinary:  []  Burning when urinating? []  Blood in urine?  Psychiatric:  []  Major depression  Hematologic:  []  Bleeding problems? []  Problems with blood clotting?  Dermatologic:  []  Rashes or ulcers?  Constitutional:  []  Fever or chills?  Ear/Nose/Throat:  []  Change in hearing? []  Nose bleeds? []  Sore throat?  Musculoskeletal:  []  Back pain? []  Joint pain? []  Muscle pain?   For VQI Use Only   PRE-ADM LIVING Home  AMB STATUS Ambulatory  CAD Sx None  PRIOR CHF None  STRESS TEST No    Physical Examination  Vitals:   04/27/17 1112  BP: (!) 146/100  Pulse: 81  Resp: 16  Temp: 97.8 F (36.6 C)  TempSrc: Oral  SpO2: 95%  Weight: 109 lb (49.4 kg)  Height: 5\' 7"  (1.702 m)   Body mass index is 17.07 kg/m.  General Alert, O x 3, WD, Ill appearing, thin  Head /AT,    Ear/Nose/ Throat Hearing grossly intact, nares without erythema or drainage, oropharynx without Erythema or Exudate, Mallampati score: 3,   Eyes PERRLA, EOMI,    Neck Supple, mid-line trachea,    Pulmonary Sym exp, good B air movt, CTA B  Cardiac RRR, Nl S1, S2, no Murmurs, No rubs, No  S3,S4  Vascular Vessel Right Left  Radial Palpable Palpable  Brachial Palpable Palpable  Carotid Palpable, No Bruit Palpable, No Bruit  Aorta Not palpable N/A  Femoral Not palpable Faintly palpable  Popliteal Not palpable Not palpable  PT Not palpable Not palpable  DP Not palpable Not palpable    Gastro- intestinal soft, non-distended, non-tender to palpation, No guarding or rebound, no HSM, no masses, no CVAT B, No palpable prominent aortic pulse,    Musculo- skeletal M/S 5/5 throughout  , Extremities without ischemic changes  , No edema present, Varicosities present: L>>R, No Lipodermatosclerosis present  Neurologic Cranial nerves 2-12 intact , Pain and light touch intact in extremities , Motor exam as listed above  Psychiatric Judgement intact, Mood & affect appropriate for pt's clinical situation  Dermatologic See M/S exam for extremity exam, No rashes otherwise noted  Lymphatic  Palpable lymph nodes: None    Non-Invasive Vascular imaging   Outside BLE ABI (04/19/17)  R:   ABI: 0.46,   PT: mono  DP: mono   Pero: mono  TBI:  0.32  L:   ABI: 0.53,   PT: mono  DP: mono   Pero: Mono  TBI: 0.47   Radiology     CT Abd/pelvis (04/02/17): 1. Aortic atherosclerosis with fusiform ectasia of the infrarenal abdominal aorta which measures up to 2.6 x 2.3 cm, and contains a large burden of mural thrombus. 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 2. Today's study was not a CTA examination, which limits assessment of vascular abnormalities. Additionally, secondary to extensive patient respiratory motion, accurate assessment of small vessels is limited. With these limitations in mind, there is potential stenosis of both the origin of the celiac axis and the origin of the inferior mesenteric artery. Given the patient's history of intermittent abdominal pain, further evaluation  with CTA of the abdomen and pelvis should be considered if clinically appropriate, and if the patient can be counseled to adequately hold his breath during the examination to allow for optimal image quality. 3. No other acute findings are noted in the chest, abdomen or pelvis to account for the patient's symptoms. 4. Diffuse bronchial wall thickening with moderate centrilobular and paraseptal emphysema; imaging findings suggestive of underlying COPD. 5. Multiple small pulmonary nodules measuring up to 6 mm, stable compared to prior examinations dating back take 02/22/2015, considered benign. No future follow-up needed. This recommendation follows the consensus statement: Guidelines for Management of Incidental Pulmonary Nodules Detected on CT Images: From the Fleischner Society 2017; Radiology 2017; 284:228-243. 6. Additional incidental findings, as above. Aortic Atherosclerosis (ICD10-I70.0) and Emphysema (ICD10-J43.9).   Outside Studies/Documentation   5 pages of outside documents were reviewed including: outpatient PCP chart    Medical Decision Making  Bobby Caldwell is a 61 y.o. male who presents with: occupation limiting R>>L intermittent claudication, known R CIA occlusion, L CIA stenosis, COPD with emphysema   Based on this patient's history and physical exam, I recommend: aortogram, bilateral runoff, and possible intervention  I discussed with the patient the nature of angiographic procedures, especially the limited patencies of any endovascular intervention.   The patient is aware of that the risks of an angiographic procedure include but are not limited to: bleeding, infection, access site complications, renal failure, embolization, rupture of vessel, dissection, arteriovenous fistula, possible need for emergent surgical intervention, possible need for surgical procedures to treat the patient's pathology, anaphylactic reaction to contrast, and stroke and death.   The patient  is aware of the risks and agrees to proceed. The patient is scheduled for the 25 OCT 18 The patient is currently not on on statin as not medically indicated.  The patient is currently not on an anti-platelet. Patient will be started on ASA 81 mg PO daily  Thank you for allowing [place] to participate in this patient's care.   Adele Barthel, MD, FACS [facility] Office: [phone] Pager: [phone]  04/26/2017, 8:22 PM

## 2017-05-03 NOTE — Progress Notes (Signed)
Report called to [facility] [phone] pt was kept in bed and transported with rn and tech.

## 2017-05-03 NOTE — Progress Notes (Signed)
   Daily Progress Note   Vitals:   05/03/17 1303 05/03/17 1404 05/03/17 1455 05/03/17 1500  BP: 122/74 118/89 122/77 106/72  Pulse: 96 (!) 103 (!) 115 (!) 106  Resp:      Temp:      TempSrc:      SpO2: 92% 93% 98% 98%  Weight:      Height:       Pt had bleed while walking to car.  Bleeding controlled with repeat pressure hold.  Subsequently will admit overnight  - Will admit overnight and will plan on getting Cardiology and Pulmonary to evaluate him for fem-fem bypass  - Will also get Anesthesia to see pt to see what anesthetic plan they are willing to proceed with  Adele Barthel, MD, [place] [facility] [place]: [phone] Pager: [phone]  05/03/2017, 3:31 PM

## 2017-05-03 NOTE — Progress Notes (Signed)
Site area: rt groin fa sheath Site Prior to Removal:  Level 0 Pressure Applied For:  40 minutes Manual:   yes Patient Status During Pull:  stable Post Pull Site:  Level  0 Post Pull Instructions Given:  yes Post Pull Pulses Present: dopplered Dressing Applied:  Gauze and tegaderm Bedrest begins @ 8270 Comments:

## 2017-05-04 ENCOUNTER — Encounter (HOSPITAL_COMMUNITY): Payer: Self-pay | Admitting: Vascular Surgery

## 2017-05-04 ENCOUNTER — Encounter: Payer: Self-pay | Admitting: Vascular Surgery

## 2017-05-04 DIAGNOSIS — I70211 Atherosclerosis of native arteries of extremities with intermittent claudication, right leg: Secondary | ICD-10-CM

## 2017-05-04 DIAGNOSIS — Z01810 Encounter for preprocedural cardiovascular examination: Secondary | ICD-10-CM | POA: Diagnosis not present

## 2017-05-04 DIAGNOSIS — J449 Chronic obstructive pulmonary disease, unspecified: Secondary | ICD-10-CM | POA: Diagnosis not present

## 2017-05-04 DIAGNOSIS — Z720 Tobacco use: Secondary | ICD-10-CM | POA: Diagnosis not present

## 2017-05-04 DIAGNOSIS — Z01818 Encounter for other preprocedural examination: Secondary | ICD-10-CM | POA: Diagnosis not present

## 2017-05-04 DIAGNOSIS — E1151 Type 2 diabetes mellitus with diabetic peripheral angiopathy without gangrene: Secondary | ICD-10-CM | POA: Diagnosis not present

## 2017-05-04 DIAGNOSIS — I998 Other disorder of circulatory system: Secondary | ICD-10-CM

## 2017-05-04 LAB — BASIC METABOLIC PANEL
ANION GAP: 6 (ref 5–15)
BUN: 9 mg/dL (ref 6–20)
CO2: 25 mmol/L (ref 22–32)
Calcium: 8.2 mg/dL — ABNORMAL LOW (ref 8.9–10.3)
Chloride: 106 mmol/L (ref 101–111)
Creatinine, Ser: 0.52 mg/dL — ABNORMAL LOW (ref 0.61–1.24)
GLUCOSE: 77 mg/dL (ref 65–99)
POTASSIUM: 3.8 mmol/L (ref 3.5–5.1)
Sodium: 137 mmol/L (ref 135–145)

## 2017-05-04 LAB — CBC
HEMATOCRIT: 40.4 % (ref 39.0–52.0)
Hemoglobin: 13.6 g/dL (ref 13.0–17.0)
MCH: 29.7 pg (ref 26.0–34.0)
MCHC: 33.7 g/dL (ref 30.0–36.0)
MCV: 88.2 fL (ref 78.0–100.0)
Platelets: 179 10*3/uL (ref 150–400)
RBC: 4.58 MIL/uL (ref 4.22–5.81)
RDW: 13.8 % (ref 11.5–15.5)
WBC: 9.6 10*3/uL (ref 4.0–10.5)

## 2017-05-04 LAB — HIV ANTIBODY (ROUTINE TESTING W REFLEX): HIV SCREEN 4TH GENERATION: NONREACTIVE

## 2017-05-04 MED ORDER — IPRATROPIUM-ALBUTEROL 0.5-2.5 (3) MG/3ML IN SOLN: 3.0000 mL | Freq: Four times a day (QID) | RESPIRATORY_TRACT | Status: DC

## 2017-05-04 NOTE — Progress Notes (Signed)
   Daily Progress Note   Assessment/Planning:   RLE rest pain, Severe COPD   No further active bleeding.  Suspect pt bleed to limited subcutaneous tissue and augment left CFA blood since the left CIA stenting opened the >90% stenosis on that side  Planning on L to R femorofemoral bypass due to severity of blood flow to right foot on angiogram  Appreciate Pulmonary's input  Cardiology's input pending   Subjective  - 1 Day Post-Op   No complaints today, wants to go   Objective   Vitals:   05/04/17 0337 05/04/17 0352 05/04/17 0400 05/04/17 0500  BP: 119/81 96/63    Pulse: 96 96 90 83  Resp: 18 (!) 21 (!) 23 (!) 22  Temp: 98.8 F (37.1 C)     TempSrc: Oral     SpO2: 93% 94% 94% 95%  Weight:      Height:         Intake/Output Summary (Last 24 hours) at 05/04/17 1702 Last data filed at 05/04/17 0800  Gross per 24 hour  Intake            872.5 ml  Output                0 ml  Net            872.5 ml    PULM  CTAB  CV  RRR  GI  soft, NTND  VASC L groin no hematoma, no PSA, +echymosis  NEURO Distal neurovascular intact    Laboratory   CBC CBC Latest Ref Rng & Units 05/04/2017 05/03/2017 05/03/2017  WBC 4.0 - 10.5 K/uL 9.6 10.6(H) -  Hemoglobin 13.0 - 17.0 g/dL 13.6 14.3 15.6  Hematocrit 39.0 - 52.0 % 40.4 41.6 46.0  Platelets 150 - 400 K/uL 179 170 -    BMET    Component Value Date/Time   NA 137 05/04/2017 0238   K 3.8 05/04/2017 0238   CL 106 05/04/2017 0238   CO2 25 05/04/2017 0238   GLUCOSE 77 05/04/2017 0238   BUN 9 05/04/2017 0238   CREATININE 0.52 (L) 05/04/2017 0238   CALCIUM 8.2 (L) 05/04/2017 0238   GFRNONAA >60 05/04/2017 0238   GFRAA >60 05/04/2017 0238     Adele Barthel, MD, FACS [facility] Office: [phone] Pager: [phone]  05/04/2017, 5:02 PM

## 2017-05-04 NOTE — Consult Note (Signed)
Cardiology Consultation:   Patient ID: Bobby Caldwell; [id number]; [birth year]   Admit date: 05/03/2017 Date of Consult: 05/04/2017  Primary Care Provider: Patient, No Pcp Per Primary Cardiologist: Dr. Ellyn Hack ([place])   Patient Profile:   Bobby Caldwell is a 61 y.o. male with a hx of emphysema/COPD with ongoing tobacco abuse and PAD who is being seen today for the evaluation of cardiac clearance at the request of Dr. Bridgett Larsson.  No prior history of stroke or MI. Denies family history of MI. Ongoing tobacco smoking since age 16, used to smoke 2 pack a day for greater than 30, currently smoking less than one pack a day.   History of Present Illness:   Bobby Caldwell recently evaluated by vascular surgery for bilateral. Recent CT of abdomen and pelvis showed periventricular vascular disease. Subsequently patient underwent aortogram 05/03/17.   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 Cardiology is consulted for preoperative evaluation.  Patient has a chronic dyspnea on exertion due to COPD. He denies any exertional chest pain, nausea or diaphoresis. He denies orthopnea, PND, syncope, melena, lower extremity edema or palpitation.  No TIA or amaurosis fugax symptoms. The patient tells me that he is more limited by his bilateral lower extremity claudication than dyspnea, but certainly does have exertional dyspnea which is been going on for about 2 years now.  He denies any active anginal type chest tightness or pressure with rest or exertion.  He denies any CHF symptoms of orthopnea or PND.  Recent CT of the chest showed aortic atherosclerosis as well.  Past Medical History:    Diagnosis Date  . COPD (chronic obstructive pulmonary disease) ([place])   . PAD (peripheral artery disease) ([place])   . Tobacco abuse     Past Surgical History:  Procedure Laterality Date  . ABDOMINAL AORTOGRAM W/LOWER EXTREMITY N/A 05/03/2017   Procedure: ABDOMINAL AORTOGRAM W/LOWER EXTREMITY;  Surgeon: Conrad Modoc, MD;  Location: [facility];  Service: Cardiovascular;  Laterality: N/A;  . PERIPHERAL VASCULAR INTERVENTION Left 05/03/2017   Procedure: PERIPHERAL VASCULAR INTERVENTION;  Surgeon: Conrad [place], MD;  Location: [facility];  Service: Cardiovascular;  Laterality: Left;  common iliac     Inpatient Medications: Scheduled Meds: . clopidogrel  75 mg Oral Daily  . enoxaparin (LOVENOX) injection  40 mg Subcutaneous Q24H  . ipratropium-albuterol  3 mL Nebulization QID  . senna  1 tablet Oral BID  . sodium chloride flush  3 mL Intravenous Q12H   Continuous Infusions: . sodium chloride    . sodium chloride 75 mL/hr at 05/04/17 0549   PRN Meds: sodium chloride, acetaminophen **OR** acetaminophen, alum & mag hydroxide-simeth, bisacodyl, guaiFENesin-dextromethorphan, hydrALAZINE, labetalol, metoprolol tartrate, morphine injection, ondansetron, oxyCODONE-acetaminophen, polyethylene glycol, sodium chloride flush, sodium phosphate  Allergies:   No Known Allergies  Social History:   Social History   Social History  . Marital status: Single    Spouse name: N/A  . Number of children: N/A  . Years of education: N/A   Occupational History  . Not on file.   Social History Main Topics  . Smoking status: Current Every Day Smoker    Packs/day: 0.50  Years: 50.00    Types: Cigarettes  . Smokeless tobacco: Never Used     Comment: 10 cigarettes per day  . Alcohol use No  . Drug use: No  . Sexual activity: Not on file   Other Topics Concern  . Not on file   Social History Narrative  . No narrative on file    Family History: He essentially does not know most  of his family history.  He is not sure of details.  He denies any premature CAD. As above  ROS:  Please see the history of present illness.  Review of Systems  Constitution: Negative for weakness.  Cardiovascular: Positive for claudication and dyspnea on exertion (for 2 years - more limited by claudication). Negative for chest pain, irregular heartbeat, orthopnea, palpitations, paroxysmal nocturnal dyspnea and syncope.  Respiratory: Positive for cough (Chronic smoker's cough) and shortness of breath.   Gastrointestinal: Negative for constipation, hematemesis and melena.  Genitourinary: Negative for hematuria.  Neurological: Negative for dizziness, focal weakness and headaches.  Psychiatric/Behavioral: Negative for altered mental status. The patient is not nervous/anxious.   All other systems reviewed and are negative.  All other ROS reviewed and negative.     Physical Exam/Data:   Vitals:   05/04/17 0337 05/04/17 0352 05/04/17 0400 05/04/17 0500  BP: 119/81 96/63    Pulse: 96 96 90 83  Resp: 18 (!) 21 (!) 23 (!) 22  Temp: 98.8 F (37.1 C)     TempSrc: Oral     SpO2: 93% 94% 94% 95%  Weight:      Height:        Intake/Output Summary (Last 24 hours) at 05/04/17 1529 Last data filed at 05/04/17 0800  Gross per 24 hour  Intake            872.5 ml  Output                0 ml  Net            872.5 ml   Filed Weights   05/03/17 0545  Weight: 110 lb (49.9 kg)   Body mass index is 17.23 kg/m.  General:  Well nourished, well developed, in no acute distress HEENT: normal Lymph: no adenopathy Neck: no JVD Endocrine:  No thryomegaly Vascular: No carotid bruits; FA pulses 2+ bilaterally without bruits  Cardiac:  normal S1, S2; RRR; no murmur Lungs:  clear to auscultation bilaterally, no wheezing, rhonchi or rales; mild diffuse interstitial sounds and increased AP diameter.  Bucket-handle breathing, but nonlabored Abd: soft, nontender, no hepatomegaly  Ext: no edema; diminished  pedal pulses.  Very difficult to palpate.  Bilateral femoral bruits Musculoskeletal:  No deformities, BUE and BLE strength normal and equal Skin: warm and dry  Neuro:  CNs 2-12 intact, no focal abnormalities noted Psych:  Normal affect   EKG:  The EKG was personally reviewed and demonstrates:  Sinus rhythm with repolarization abnormality. Telemetry:  Telemetry was personally reviewed and demonstrates:  Not on telemetry  Relevant CV Studies: As above   Laboratory Data:  Chemistry  Recent Labs Lab 05/03/17 0620 05/03/17 1741 05/04/17 0238  NA 139  --  137  K 4.6  --  3.8  CL 99*  --  106  CO2  --   --  25  GLUCOSE 81  --  77  BUN 11  --  9  CREATININE 0.50* 0.58* 0.52*  CALCIUM  --   --  8.2*  GFRNONAA  --  >  60 >60  GFRAA  --  >60 >60  ANIONGAP  --   --  6    No results for input(s): PROT, ALBUMIN, AST, ALT, ALKPHOS, BILITOT in the last 168 hours. Hematology  Recent Labs Lab 05/03/17 0620 05/03/17 1741 05/04/17 0238  WBC  --  10.6* 9.6  RBC  --  4.72 4.58  HGB 15.6 14.3 13.6  HCT 46.0 41.6 40.4  MCV  --  88.1 88.2  MCH  --  30.3 29.7  MCHC  --  34.4 33.7  RDW  --  13.6 13.8  PLT  --  170 179    Radiology/Studies:  No results found.  Assessment and Plan:   1. Pre-operative cardiac clearance - Patient has a chronic shortness of breath with exertion without symptoms concerning for CHF or angina. He denies any exertional chest pain or tightness. Recent CT of chest showed aortic atherosclerosis. His cardiac risk factor includes significant history of tobacco abuse. Will review with M.D.  2. Tobacco abuse - Advised complete cessation.  3. PAD with lifestyle limiting claudication: Recommend aspirin, statin.  Once we understand his blood pressure, would benefit from beta-blocker   For questions or updates, please contact [place] Please consult [email or web address] for contact info under Cardiology/STEMI.   Jarrett Soho, PA  05/04/2017 3:29 PM    I have seen, examined and evaluated the patient this PM along with Mr. Geannie Risen.  After reviewing all the available data and chart, we discussed the patients laboratory, study & physical findings as well as symptoms in detail. I agree with his findings, examination as well as impression recommendations as per our discussion.    Attending adjustments noted in italics.   Somewhat complicated situation where this gentleman clearly requires lower extremity revascularization for severe symptoms limiting claudication.  Difficult to know his activity level because he is limited by claudication and dyspnea.  He however denies any active anginal symptoms or any heart failure symptoms.  He does not have a history of stroke or diabetes.  His renal function is stable, with a creatinine of 0.52. He does have aortic calcification noted on chest CTA but does not have comment on coronary artery calcification.  Based on the ACC/AHA guidelines, the patient is not actively having any anginal symptoms or heart failure symptoms.  No arrhythmias.  Surgery is nonemergent, but somewhat urgent.  At this point my recommendation would be to proceed with his peripheral vascular surgery which is low to intermediate risk from a cardiac standpoint.  Data would suggest that patients without active angina or heart failure symptoms do better having had her surgery prior to coronary revascularization then after.  We do not have documentation of coronary artery disease, however he clearly has risk factors.  My recommendation would be to revascularize his lower extremities so that he can walk.  We can then consider outpatient evaluation for coronary ischemia.  If there are any cardiac complications, we will be available perioperatively for assistance.  PREOPERATIVE CARDIAC RISK ASSESSMENT   Revised Cardiac Risk Index: (RCRI)  High Risk Surgery: no; ~low-intermediate (infrainguinal vascular surgery)  Defined as Intraperitoneal,  intrathoracic or suprainguinal vascular  Active CAD: no; no active angina symptoms.  CHF: no; no PND, orthopnea or edema.  Cerebrovascular Disease: no; not documented.  No stroke  Diabetes: no; On Insulin: no  CKD (Cr >~ 2): no; creatinine 0.52  Total: 0 Estimated Risk of Adverse Outcome: LOW Estimated Risk of MI, PE, VF/VT (Cardiac Arrest), Complete Heart Block: <1 % --  Although patient benefit from being on beta-blocker perioperatively, this would only be if he has been on it for long-term.  Therefore starting at this point would not be effective.   ACC/AHA Guidelines for "Clearance":  Step 1 - Need for Emergency Surgery: No:   If Yes - go straight to OR with perioperative surveillance  Step 2 - Active Cardiac Conditions (Unstable Angina, Decompensated HF, Significant  Arrhytmias - Complete HB, Mobitz II, Symptomatic VT or SVT, Severe Aortic Stenosis - mean gradient > 40 mmHg, Valve area < 1.0 cm2):   No:   If Yes - Evaluate & Treat per ACC/AHA Guidelines  Step 3 -  Low Risk Surgery: No: Intermediate risk  If Yes --> proceed to OR  If No --> Step 4  Step 4 - Functional Capacity >= 4 METS without symptoms: No: Limited by dyspnea and claudication  If Yes --> proceed to OR  If No --> Step 5  Step 5 --  Clinical Risk Factors (CRF) (noted in RCRI)  3 or more: No:   If Yes -- assess Surgical Risk, --   (High Risk Non-cardiac), Intraabdominal or thoracic vascular surgery consider testing if it will change management.  Intermediate Risk: Proceed to OR with HR control, or consider testing if it will change management  1-2 or more CRFs: No:   If Yes -- assess Surgical Risk, --   (High Risk Non-cardiac), Intraabdominal or thoracic vascular surgery --> Proceed to OR, or consider testing if it will change management.  Intermediate Risk: Proceed to OR with HR control, or consider testing if it will change management  No CRFs: Yes  If Yes --> Proceed to OR   I will  be happy to see him in follow-up after his surgery to discuss cardiac evaluation.     Glenetta Hew, M.D., M.S. Interventional Cardiologist   Pager # [phone] Phone # [phone] [address] [place] [employer] [place] [employer], [place] [address]

## 2017-05-04 NOTE — Consult Note (Signed)
Name: Bobby Caldwell MRN: [id number] DOB: [birth year]    ADMISSION DATE:  05/03/2017 CONSULTATION DATE: 05/04/17  REFERRING MD : Bridgett Larsson  CHIEF COMPLAINT: Management of COPD prior to surgery     HISTORY OF PRESENT ILLNESS: 61 year old heavy smoker with severe COPD followed by my partner Dr. Lenna Gilford.  He also has severe peripheral arterial disease and underwent angiogram 10/25.  Plan is for femorofemoral bypass next week and we are asked to assist with management of COPD perioperatively.  He developed bleeding from his groin and therefore was hospitalized overnight post angiogram He started smoking at age 66, smoked up to 2 packs/day for 30 years, emigrated to the [place] from [place] in 1998 and continues to smoke about half pack per day.  He works at [employer] in [employer].  He is limited more by claudication in his legs but also reports dyspnea and mild dry cough.  He has been maintained on a regimen of Symbicort and Spiriva.   Significant tests/ events reviewed  Spirometry 02/2015 has shown severe airway obstruction with FEV1 of 1.25, 36% CT chest 04/02/17 was reviewed which shows moderate centrilobular and paraseptal emphysema and multiple small nodules bilaterally unchanged from prior scans in 2016 and 2017, largest was 6 mm in the right middle lobe  Ambulatory Oximetry on RA 03/26/17> O2sat=96% on RA at rest w/ pulse=105/min;  He ambulated only 2 laps in the office (185'ea) w/ lowest O2sat=96% w/ pulse=107/min;  He stopped due to leg weakness & pain...  PAST MEDICAL HISTORY :   has no past medical history on file.  has a past surgical history that includes ABDOMINAL AORTOGRAM W/LOWER EXTREMITY (N/A, 05/03/2017) and PERIPHERAL VASCULAR INTERVENTION (Left, 05/03/2017).    Prior to Admission medications   Medication Sig Start Date End Date Taking? Authorizing Provider  Albuterol Sulfate (PROAIR RESPICLICK) 017 (90 Base) MCG/ACT AEPB Inhale 2 puffs into the lungs every 6 (six) hours as  needed. 01/10/16  Yes [name], MD  budesonide-formoterol [facility]) 160-4.5 MCG/ACT inhaler Inhale 2 puffs into the lungs 2 (two) times daily. 01/10/16  Yes [name], MD  Tiotropium Bromide Monohydrate (SPIRIVA RESPIMAT) 2.5 MCG/ACT AERS Inhale 2 puffs into the lungs daily. 01/10/16  Yes [name], MD  clopidogrel (PLAVIX) 75 MG tablet Take 1 tablet (75 mg total) by mouth daily. 05/03/17   Conrad [place], MD   No Known Allergies  FAMILY HISTORY:  family history is not on file. SOCIAL HISTORY:  reports that he has been smoking Cigarettes.  He has a 25.00 pack-year smoking history. He has never used smokeless tobacco. He reports that he does not drink alcohol or use drugs.  REVIEW OF SYSTEMS:   Positive for left groin pain, dyspnea, claudication in both his legs  Constitutional: Negative for fever, chills, weight loss, malaise/fatigue and diaphoresis.  HENT: Negative for hearing loss, ear pain, nosebleeds, congestion, sore throat, neck pain, tinnitus and ear discharge.   Eyes: Negative for blurred vision, double vision, photophobia, pain, discharge and redness.  Respiratory: Negative for cough, hemoptysis, sputum production,  wheezing and stridor.   Cardiovascular: Negative for chest pain, palpitations, orthopnea, claudication, leg swelling and PND.  Gastrointestinal: Negative for heartburn, nausea, vomiting, abdominal pain, diarrhea, constipation, blood in stool and melena.  Genitourinary: Negative for dysuria, urgency, frequency, hematuria and flank pain.  Musculoskeletal: Negative for myalgias, back pain, joint pain and falls.  Skin: Negative for itching and rash.  Neurological: Negative for dizziness, tingling, tremors, sensory change, speech change, focal weakness,  seizures, loss of consciousness, weakness and headaches.  Endo/Heme/Allergies: Negative for environmental allergies and polydipsia. Does not bruise/bleed easily.  SUBJECTIVE:   VITAL SIGNS: Temp:  [96.2 F  (35.7 C)-98.8 F (37.1 C)] 98.8 F (37.1 C) (10/26 0337) Pulse Rate:  [83-115] 83 (10/26 0500) Resp:  [12-24] 22 (10/26 0500) BP: (96-189)/(63-111) 96/63 (10/26 0352) SpO2:  [92 %-99 %] 95 % (10/26 0500)  PHYSICAL EXAMINATION: General: Thin built man lying supine in no respiratory distress Neuro: Alert, interactive, no tremors HEENT: No JVD, carotid bruits Cardiovascular: S1-S2 normal Lungs: Decreased breath sounds bilaterally with faint expiratory rhonchi at the bases Abdomen: Soft nontender Musculoskeletal: Good pulses bilateral Skin: No rash   Recent Labs Lab 05/03/17 0620 05/03/17 1741 05/04/17 0238  NA 139  --  137  K 4.6  --  3.8  CL 99*  --  106  CO2  --   --  25  BUN 11  --  9  CREATININE 0.50* 0.58* 0.52*  GLUCOSE 81  --  77    Recent Labs Lab 05/03/17 0620 05/03/17 1741 05/04/17 0238  HGB 15.6 14.3 13.6  HCT 46.0 41.6 40.4  WBC  --  10.6* 9.6  PLT  --  170 179   No results found.  ASSESSMENT / PLAN:  Severe COPD, Gold stage III, but no recent exacerbations Active smoker Severe peripheral arterial disease  Recommend-he is at mild risk for perioperative complications from peripheral arterial bypass surgery He can continue on his regimen of Symbicort 2 puffs twice daily and Spiriva once daily.  I have emphasized him to be compliant over the next few days in case he is discharged.  He will need to be assessed on the morning of surgery and if he has bronchospasm, can be given albuterol nebs every 6 hours as needed.  Do not feel the need to place him on steroids at this time  Clearly smoking cessation is paramount and this was emphasized to him.  I offered him nicotine patch but he declined. Choice of anesthesia route would be deferred to anesthesia service, his lung function does not preclude general anesthesia PCCM can consult postop again if required  Kara Mead MD. FCCP. [facility] Pager [phone] If no response call [phone]     05/04/2017, 8:39 AM

## 2017-05-04 NOTE — Discharge Summary (Signed)
Physician Discharge Summary  Patient ID: Bobby Caldwell MRN: [id number] DOB/AGE: 1955/10/21 61 y.o.  Admit date: 05/03/2017 Discharge date: 05/05/15  Admission Diagnosis: critical limb ischemia   Discharge Diagnoses:  critical limb ischemia   Secondary Diagnoses: Principal Problem:   Atherosclerosis of native arteries of extremity with intermittent claudication (HCC) Active Problems:   Critical lower limb ischemia   Procedures: Aortogram, bilateral leg runoff, L CIA angioplasty and stenting (05/03/17)  Discharged Condition: stable  Hospital Course:  Bobby Caldwell is a 60 y.o. (23-Sep-1955) male is a patient with occupation limiting intermittent claudication.  The patient underwent an aortogram and bilateral leg runoff.  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  There was a sub-total occlusion of left common iliac artery.  To salvage a femorofemoral bypass, I stented this with a VBX covered stent.  The patient developed some significant bleeding from the left groin despite post-procedure control with compression.  Subsequently, I elected to admit him for observation.  While admitted, I had his preoperative evaluation with Pulmonology and Cardiology.  These evaluation are on chart.  I offered him left-to-right femorofemoral bypass this coming Tuesday, May 08, 2017.  Consults:  PCCM Cardiology   Significant Diagnostic Studies: CBC CBC Latest Ref Rng & Units 05/04/2017 05/03/2017 05/03/2017  WBC 4.0 - 10.5 K/uL 9.6 10.6(H) -  Hemoglobin 13.0 - 17.0 g/dL 13.6 14.3 15.6  Hematocrit 39.0 - 52.0 % 40.4 41.6 46.0  Platelets 150 - 400 K/uL 179 170 -     BMET @LASTCHEMISTRY @  COAG No results found for: INR, PROTIME No results found for: PTT  Disposition: 01-Home or Self Care  Discharge Instructions    Call MD for:  redness, tenderness, or signs of infection (pain, swelling, bleeding, redness, odor or  green/yellow discharge around incision site)    Complete by:  As directed    Call MD for:  severe or increased pain, loss or decreased feeling  in affected limb(s)    Complete by:  As directed    Call MD for:  temperature >100.5    Complete by:  As directed    Discharge instructions    Complete by:  As directed    Activity as tolerates   Resume previous diet    Complete by:  As directed      Allergies as of 05/04/2017   No Known Allergies     Medication List    TAKE these medications   Albuterol Sulfate 108 (90 Base) MCG/ACT Aepb Commonly known as:  PROAIR RESPICLICK Inhale 2 puffs into the lungs every 6 (six) hours as needed.   budesonide-formoterol 160-4.5 MCG/ACT inhaler Commonly known as:  SYMBICORT Inhale 2 puffs into the lungs 2 (two) times daily.   clopidogrel 75 MG tablet Commonly known as:  PLAVIX Take 1 tablet (75 mg total) by mouth daily.   Tiotropium Bromide Monohydrate 2.5 MCG/ACT Aers Commonly known as:  SPIRIVA RESPIMAT Inhale 2 puffs into the lungs daily.        Signed:  Adele Barthel, MD, FACS [facility] Office: [phone] Pager: [phone]  05/04/2017, 8:05 PM

## 2017-05-05 NOTE — Progress Notes (Signed)
Pt left with out discharge papers told me he intended to continue to smoke

## 2017-05-07 ENCOUNTER — Telehealth: Payer: Self-pay | Admitting: *Deleted

## 2017-05-07 ENCOUNTER — Encounter (HOSPITAL_COMMUNITY): Payer: Self-pay | Admitting: *Deleted

## 2017-05-07 ENCOUNTER — Other Ambulatory Visit: Payer: Self-pay | Admitting: *Deleted

## 2017-05-07 NOTE — Telephone Encounter (Signed)
Call to OR booking to change patient to TBA surgery for 05/08/17. Call to [facility] at PAT notified patient need pre-admission work up for surgery. [place] interpreter needed and call for time to be at hospital. She stated they would be contacting patient. I left a msg. For patient to call me back re surgery time.

## 2017-05-07 NOTE — Telephone Encounter (Signed)
Call to OR to make TBA, call to [facility] notified to get patient for PAT appt./call for surgery in as. Call to patient's number x2 to notify patient of time to be at hospital for surgery. Left msg. For patient to call me back.

## 2017-05-07 NOTE — Progress Notes (Signed)
Spoke with pt for pre-op call via [employer]. Pt denies chest pain or sob. Pt states he will try not to smoke prior to surgery, but he can't promise that he won't.

## 2017-05-08 ENCOUNTER — Inpatient Hospital Stay (HOSPITAL_COMMUNITY): Payer: BLUE CROSS/BLUE SHIELD

## 2017-05-08 ENCOUNTER — Ambulatory Visit: Payer: BLUE CROSS/BLUE SHIELD | Admitting: Physician Assistant

## 2017-05-08 ENCOUNTER — Encounter (HOSPITAL_COMMUNITY)
Admission: RE | Disposition: A | Discharge status: Home / Self Care | Payer: Self-pay | Source: Ambulatory Visit | Attending: Vascular Surgery

## 2017-05-08 ENCOUNTER — Telehealth: Payer: Self-pay | Admitting: Vascular Surgery

## 2017-05-08 ENCOUNTER — Inpatient Hospital Stay (HOSPITAL_COMMUNITY)
Admission: RE | Admit: 2017-05-08 | Discharge: 2017-05-09 | DRG: 254 | Disposition: A | Discharge status: Home / Self Care | Payer: BLUE CROSS/BLUE SHIELD | Source: Ambulatory Visit | Attending: Vascular Surgery | Admitting: Vascular Surgery

## 2017-05-08 DIAGNOSIS — Z95828 Presence of other vascular implants and grafts: Secondary | ICD-10-CM

## 2017-05-08 DIAGNOSIS — I739 Peripheral vascular disease, unspecified: Secondary | ICD-10-CM | POA: Diagnosis present

## 2017-05-08 DIAGNOSIS — F17210 Nicotine dependence, cigarettes, uncomplicated: Secondary | ICD-10-CM | POA: Diagnosis present

## 2017-05-08 DIAGNOSIS — J439 Emphysema, unspecified: Secondary | ICD-10-CM | POA: Diagnosis present

## 2017-05-08 DIAGNOSIS — Z7951 Long term (current) use of inhaled steroids: Secondary | ICD-10-CM

## 2017-05-08 DIAGNOSIS — M79604 Pain in right leg: Secondary | ICD-10-CM | POA: Diagnosis present

## 2017-05-08 DIAGNOSIS — Z79899 Other long term (current) drug therapy: Secondary | ICD-10-CM | POA: Diagnosis not present

## 2017-05-08 DIAGNOSIS — M79605 Pain in left leg: Secondary | ICD-10-CM | POA: Diagnosis present

## 2017-05-08 DIAGNOSIS — Z01818 Encounter for other preprocedural examination: Secondary | ICD-10-CM

## 2017-05-08 HISTORY — PX: FEMORAL-FEMORAL BYPASS GRAFT: SHX936

## 2017-05-08 HISTORY — DX: Dyspnea, unspecified: R06.00

## 2017-05-08 LAB — URINALYSIS, ROUTINE W REFLEX MICROSCOPIC
BILIRUBIN URINE: NEGATIVE
GLUCOSE, UA: NEGATIVE mg/dL
Hgb urine dipstick: NEGATIVE
KETONES UR: NEGATIVE mg/dL
LEUKOCYTES UA: NEGATIVE
Nitrite: NEGATIVE
PH: 6 (ref 5.0–8.0)
PROTEIN: NEGATIVE mg/dL
Specific Gravity, Urine: 1.01 (ref 1.005–1.030)

## 2017-05-08 LAB — COMPREHENSIVE METABOLIC PANEL
ALBUMIN: 3.1 g/dL — AB (ref 3.5–5.0)
ALT: 18 U/L (ref 17–63)
ANION GAP: 8 (ref 5–15)
AST: 16 U/L (ref 15–41)
Alkaline Phosphatase: 88 U/L (ref 38–126)
BUN: 6 mg/dL (ref 6–20)
CO2: 28 mmol/L (ref 22–32)
Calcium: 8.6 mg/dL — ABNORMAL LOW (ref 8.9–10.3)
Chloride: 103 mmol/L (ref 101–111)
Creatinine, Ser: 0.56 mg/dL — ABNORMAL LOW (ref 0.61–1.24)
GFR calc Af Amer: >60 mL/min (ref >60)
GFR calc non Af Amer: >60 mL/min (ref >60)
GLUCOSE: 91 mg/dL (ref 65–99)
POTASSIUM: 3.3 mmol/L — AB (ref 3.5–5.1)
Sodium: 139 mmol/L (ref 135–145)
Total Bilirubin: 0.8 mg/dL (ref 0.3–1.2)
Total Protein: 6.1 g/dL — ABNORMAL LOW (ref 6.5–8.1)

## 2017-05-08 LAB — CREATININE, SERUM
Creatinine, Ser: 0.56 mg/dL — ABNORMAL LOW (ref 0.61–1.24)
GFR calc Af Amer: >60 mL/min (ref >60)
GFR calc non Af Amer: >60 mL/min (ref >60)

## 2017-05-08 LAB — PROTIME-INR
INR: 1.03
PROTHROMBIN TIME: 13.4 s (ref 11.4–15.2)

## 2017-05-08 LAB — APTT: APTT: 30 s (ref 24–36)

## 2017-05-08 LAB — SURGICAL PCR SCREEN
MRSA, PCR: NEGATIVE
Staphylococcus aureus: NEGATIVE

## 2017-05-08 LAB — CBC
HCT: 39.4 % (ref 39.0–52.0)
Hemoglobin: 13.4 g/dL (ref 13.0–17.0)
MCH: 29.8 pg (ref 26.0–34.0)
MCHC: 34 g/dL (ref 30.0–36.0)
MCV: 87.8 fL (ref 78.0–100.0)
Platelets: 212 K/uL (ref 150–400)
RBC: 4.49 MIL/uL (ref 4.22–5.81)
RDW: 13.4 % (ref 11.5–15.5)
WBC: 8.1 K/uL (ref 4.0–10.5)

## 2017-05-08 LAB — TYPE AND SCREEN
ABO/RH(D): B POS
ANTIBODY SCREEN: NEGATIVE

## 2017-05-08 LAB — ABO/RH: ABO/RH(D): B POS

## 2017-05-08 SURGERY — CREATION, BYPASS, ARTERIAL, FEMORAL TO FEMORAL, USING GRAFT
Anesthesia: General | Site: Groin | Laterality: Bilateral

## 2017-05-08 MED ORDER — HEPARIN SODIUM (PORCINE) 1000 UNIT/ML IJ SOLN
INTRAMUSCULAR | Status: DC | PRN
  Administered 2017-05-08: 5000 [IU] via INTRAVENOUS

## 2017-05-08 MED ORDER — DEXAMETHASONE SODIUM PHOSPHATE 10 MG/ML IJ SOLN
INTRAMUSCULAR | Status: DC | PRN
  Administered 2017-05-08: 10 mg via INTRAVENOUS

## 2017-05-08 MED ORDER — METOPROLOL TARTRATE 5 MG/5ML IV SOLN: 2.0000 mg | INTRAVENOUS | Status: DC | PRN

## 2017-05-08 MED ORDER — SODIUM CHLORIDE 0.9 % IV SOLN: INTRAVENOUS | Status: DC

## 2017-05-08 MED ORDER — HEMOSTATIC AGENTS (NO CHARGE) OPTIME
TOPICAL | Status: DC | PRN
  Administered 2017-05-08: 1 via TOPICAL

## 2017-05-08 MED ORDER — SODIUM CHLORIDE 0.9 % IV SOLN
INTRAVENOUS | Status: DC | PRN
  Administered 2017-05-08: 09:00:00 500 mL

## 2017-05-08 MED ORDER — HYDRALAZINE HCL 20 MG/ML IJ SOLN: 5.0000 mg | INTRAMUSCULAR | Status: DC | PRN

## 2017-05-08 MED ORDER — ALBUTEROL SULFATE (2.5 MG/3ML) 0.083% IN NEBU: 2.5000 mg | INHALATION_SOLUTION | Freq: Four times a day (QID) | RESPIRATORY_TRACT | Status: DC | PRN

## 2017-05-08 MED ORDER — SODIUM CHLORIDE 0.9 % IV SOLN: 500.0000 mL | Freq: Once | INTRAVENOUS | Status: DC | PRN

## 2017-05-08 MED ORDER — HEPARIN SODIUM (PORCINE) 1000 UNIT/ML IJ SOLN
INTRAMUSCULAR | Status: AC
  Filled 2017-05-08: qty 1

## 2017-05-08 MED ORDER — ROCURONIUM BROMIDE 100 MG/10ML IV SOLN
INTRAVENOUS | Status: DC | PRN
  Administered 2017-05-08: 10 mg via INTRAVENOUS
  Administered 2017-05-08: 50 mg via INTRAVENOUS

## 2017-05-08 MED ORDER — PROPOFOL 10 MG/ML IV BOLUS
INTRAVENOUS | Status: AC
  Filled 2017-05-08: qty 20

## 2017-05-08 MED ORDER — ONDANSETRON HCL 4 MG/2ML IJ SOLN: 4.0000 mg | Freq: Once | INTRAMUSCULAR | Status: DC | PRN

## 2017-05-08 MED ORDER — ATORVASTATIN CALCIUM 10 MG PO TABS: 10.0000 mg | ORAL_TABLET | Freq: Every day | ORAL | 2 refills | Status: DC

## 2017-05-08 MED ORDER — MOMETASONE FURO-FORMOTEROL FUM 200-5 MCG/ACT IN AERO
2.0000 | INHALATION_SPRAY | Freq: Two times a day (BID) | RESPIRATORY_TRACT | Status: DC
  Administered 2017-05-08 – 2017-05-09 (×2): 2 via RESPIRATORY_TRACT
  Filled 2017-05-08: qty 8.8

## 2017-05-08 MED ORDER — PHENYLEPHRINE 40 MCG/ML (10ML) SYRINGE FOR IV PUSH (FOR BLOOD PRESSURE SUPPORT)
PREFILLED_SYRINGE | INTRAVENOUS | Status: DC | PRN
  Administered 2017-05-08: 80 ug via INTRAVENOUS
  Administered 2017-05-08: 160 ug via INTRAVENOUS

## 2017-05-08 MED ORDER — MIDAZOLAM HCL 5 MG/5ML IJ SOLN
INTRAMUSCULAR | Status: DC | PRN
  Administered 2017-05-08 (×2): 1 mg via INTRAVENOUS

## 2017-05-08 MED ORDER — CEFUROXIME SODIUM 1.5 G IV SOLR
1.5000 g | Freq: Two times a day (BID) | INTRAVENOUS | Status: AC
  Administered 2017-05-08 – 2017-05-09 (×2): 1.5 g via INTRAVENOUS
  Filled 2017-05-08 (×2): qty 1.5

## 2017-05-08 MED ORDER — BISACODYL 10 MG RE SUPP
10.0000 mg | Freq: Every day | RECTAL | Status: DC | PRN
Start: 2017-05-08 — End: 2017-05-09

## 2017-05-08 MED ORDER — OXYCODONE-ACETAMINOPHEN 5-325 MG PO TABS: 1.0000 | ORAL_TABLET | Freq: Four times a day (QID) | ORAL | Status: DC | PRN

## 2017-05-08 MED ORDER — ALUM & MAG HYDROXIDE-SIMETH 200-200-20 MG/5ML PO SUSP: 15.0000 mL | ORAL | Status: DC | PRN

## 2017-05-08 MED ORDER — ONDANSETRON HCL 4 MG/2ML IJ SOLN
INTRAMUSCULAR | Status: DC | PRN
  Administered 2017-05-08: 4 mg via INTRAVENOUS

## 2017-05-08 MED ORDER — DEXAMETHASONE SODIUM PHOSPHATE 10 MG/ML IJ SOLN
INTRAMUSCULAR | Status: AC
  Filled 2017-05-08: qty 1

## 2017-05-08 MED ORDER — LIDOCAINE 2% (20 MG/ML) 5 ML SYRINGE
INTRAMUSCULAR | Status: AC
  Filled 2017-05-08: qty 5

## 2017-05-08 MED ORDER — PHENYLEPHRINE HCL 10 MG/ML IJ SOLN
INTRAVENOUS | Status: DC | PRN
  Administered 2017-05-08: 50 ug/min via INTRAVENOUS

## 2017-05-08 MED ORDER — SODIUM CHLORIDE 0.9 % IV SOLN
INTRAVENOUS | Status: DC
  Administered 2017-05-08: 16:00:00 via INTRAVENOUS

## 2017-05-08 MED ORDER — ATORVASTATIN CALCIUM 10 MG PO TABS
10.0000 mg | ORAL_TABLET | Freq: Every day | ORAL | Status: DC
  Administered 2017-05-08: 10 mg via ORAL
  Filled 2017-05-08: qty 1

## 2017-05-08 MED ORDER — PROPOFOL 10 MG/ML IV BOLUS
INTRAVENOUS | Status: DC | PRN
  Administered 2017-05-08: 120 mg via INTRAVENOUS

## 2017-05-08 MED ORDER — OXYCODONE HCL 5 MG PO TABS: 5.0000 mg | ORAL_TABLET | Freq: Once | ORAL | Status: DC | PRN

## 2017-05-08 MED ORDER — FENTANYL CITRATE (PF) 100 MCG/2ML IJ SOLN
INTRAMUSCULAR | Status: DC | PRN
  Administered 2017-05-08 (×2): 50 ug via INTRAVENOUS

## 2017-05-08 MED ORDER — MIDAZOLAM HCL 2 MG/2ML IJ SOLN
INTRAMUSCULAR | Status: AC
  Filled 2017-05-08: qty 2

## 2017-05-08 MED ORDER — FENTANYL CITRATE (PF) 250 MCG/5ML IJ SOLN
INTRAMUSCULAR | Status: AC
  Filled 2017-05-08: qty 5

## 2017-05-08 MED ORDER — PANTOPRAZOLE SODIUM 40 MG PO TBEC
40.0000 mg | DELAYED_RELEASE_TABLET | Freq: Every day | ORAL | Status: DC
  Administered 2017-05-08 – 2017-05-09 (×2): 40 mg via ORAL
  Filled 2017-05-08 (×2): qty 1

## 2017-05-08 MED ORDER — SUGAMMADEX SODIUM 200 MG/2ML IV SOLN
INTRAVENOUS | Status: AC
  Filled 2017-05-08: qty 2

## 2017-05-08 MED ORDER — PHENYLEPHRINE 40 MCG/ML (10ML) SYRINGE FOR IV PUSH (FOR BLOOD PRESSURE SUPPORT)
PREFILLED_SYRINGE | INTRAVENOUS | Status: AC
  Filled 2017-05-08: qty 10

## 2017-05-08 MED ORDER — ACETAMINOPHEN 650 MG RE SUPP: 325.0000 mg | RECTAL | Status: DC | PRN

## 2017-05-08 MED ORDER — CLOPIDOGREL BISULFATE 75 MG PO TABS
75.0000 mg | ORAL_TABLET | Freq: Every day | ORAL | Status: DC
  Administered 2017-05-09: 75 mg via ORAL
  Filled 2017-05-08: qty 1

## 2017-05-08 MED ORDER — OXYCODONE HCL 5 MG/5ML PO SOLN: 5.0000 mg | Freq: Once | ORAL | Status: DC | PRN

## 2017-05-08 MED ORDER — PHENOL 1.4 % MT LIQD: 1.0000 | OROMUCOSAL | Status: DC | PRN

## 2017-05-08 MED ORDER — POTASSIUM CHLORIDE CRYS ER 20 MEQ PO TBCR
20.0000 meq | EXTENDED_RELEASE_TABLET | Freq: Every day | ORAL | Status: DC | PRN
Start: 2017-05-08 — End: 2017-05-09

## 2017-05-08 MED ORDER — ONDANSETRON HCL 4 MG/2ML IJ SOLN: 4.0000 mg | Freq: Four times a day (QID) | INTRAMUSCULAR | Status: DC | PRN

## 2017-05-08 MED ORDER — MORPHINE SULFATE (PF) 2 MG/ML IV SOLN: 2.0000 mg | INTRAVENOUS | Status: DC | PRN

## 2017-05-08 MED ORDER — LACTATED RINGERS IV SOLN
INTRAVENOUS | Status: DC | PRN
  Administered 2017-05-08 (×2): via INTRAVENOUS

## 2017-05-08 MED ORDER — DOCUSATE SODIUM 100 MG PO CAPS
100.0000 mg | ORAL_CAPSULE | Freq: Every day | ORAL | Status: DC
Start: 2017-05-09 — End: 2017-05-09
  Administered 2017-05-09: 100 mg via ORAL
  Filled 2017-05-08: qty 1

## 2017-05-08 MED ORDER — MUPIROCIN 2 % EX OINT
1.0000 "application " | TOPICAL_OINTMENT | Freq: Once | CUTANEOUS | Status: AC
  Administered 2017-05-08: 1 via TOPICAL
  Filled 2017-05-08: qty 22

## 2017-05-08 MED ORDER — LIDOCAINE HCL (CARDIAC) 20 MG/ML IV SOLN
INTRAVENOUS | Status: DC | PRN
  Administered 2017-05-08: 40 mg via INTRAVENOUS

## 2017-05-08 MED ORDER — FENTANYL CITRATE (PF) 100 MCG/2ML IJ SOLN
25.0000 ug | INTRAMUSCULAR | Status: DC | PRN
  Administered 2017-05-08 (×2): 50 ug via INTRAVENOUS

## 2017-05-08 MED ORDER — MAGNESIUM SULFATE 2 GM/50ML IV SOLN
2.0000 g | Freq: Every day | INTRAVENOUS | Status: DC | PRN
  Filled 2017-05-08: qty 50

## 2017-05-08 MED ORDER — LABETALOL HCL 5 MG/ML IV SOLN: 10.0000 mg | INTRAVENOUS | Status: DC | PRN

## 2017-05-08 MED ORDER — ONDANSETRON HCL 4 MG/2ML IJ SOLN
INTRAMUSCULAR | Status: AC
  Filled 2017-05-08: qty 2

## 2017-05-08 MED ORDER — 0.9 % SODIUM CHLORIDE (POUR BTL) OPTIME
TOPICAL | Status: DC | PRN
  Administered 2017-05-08: 1000 mL

## 2017-05-08 MED ORDER — ENOXAPARIN SODIUM 30 MG/0.3ML ~~LOC~~ SOLN
30.0000 mg | SUBCUTANEOUS | Status: DC
  Administered 2017-05-09: 30 mg via SUBCUTANEOUS
  Filled 2017-05-08: qty 0.3

## 2017-05-08 MED ORDER — OXYCODONE-ACETAMINOPHEN 5-325 MG PO TABS: 1.0000 | ORAL_TABLET | Freq: Four times a day (QID) | ORAL | 0 refills | Status: DC | PRN

## 2017-05-08 MED ORDER — ACETAMINOPHEN 325 MG PO TABS: 325.0000 mg | ORAL_TABLET | ORAL | Status: DC | PRN

## 2017-05-08 MED ORDER — FENTANYL CITRATE (PF) 100 MCG/2ML IJ SOLN
INTRAMUSCULAR | Status: AC
  Filled 2017-05-08: qty 2

## 2017-05-08 MED ORDER — POLYETHYLENE GLYCOL 3350 17 G PO PACK: 17.0000 g | PACK | Freq: Every day | ORAL | Status: DC | PRN

## 2017-05-08 MED ORDER — CEFAZOLIN SODIUM 1 G IJ SOLR
INTRAMUSCULAR | Status: AC
  Filled 2017-05-08: qty 20

## 2017-05-08 MED ORDER — GUAIFENESIN-DM 100-10 MG/5ML PO SYRP: 15.0000 mL | ORAL_SOLUTION | ORAL | Status: DC | PRN

## 2017-05-08 MED ORDER — DEXTROSE 5 % IV SOLN
1.5000 g | INTRAVENOUS | Status: AC
  Administered 2017-05-08: 1.5 g via INTRAVENOUS
  Filled 2017-05-08: qty 1.5

## 2017-05-08 MED ORDER — TIOTROPIUM BROMIDE MONOHYDRATE 18 MCG IN CAPS
18.0000 ug | ORAL_CAPSULE | Freq: Every day | RESPIRATORY_TRACT | Status: DC
  Administered 2017-05-09: 18 ug via RESPIRATORY_TRACT
  Filled 2017-05-08: qty 5

## 2017-05-08 MED ORDER — SUGAMMADEX SODIUM 200 MG/2ML IV SOLN
INTRAVENOUS | Status: DC | PRN
  Administered 2017-05-08: 100 mg via INTRAVENOUS

## 2017-05-08 MED ORDER — PROTAMINE SULFATE 10 MG/ML IV SOLN
INTRAVENOUS | Status: DC | PRN
  Administered 2017-05-08: 40 mg via INTRAVENOUS

## 2017-05-08 MED ORDER — ROCURONIUM BROMIDE 10 MG/ML (PF) SYRINGE
PREFILLED_SYRINGE | INTRAVENOUS | Status: AC
  Filled 2017-05-08: qty 5

## 2017-05-08 SURGICAL SUPPLY — 53 items
ADH SKN CLS APL DERMABOND .7 (GAUZE/BANDAGES/DRESSINGS) ×2
AGENT HMST SPONGE THK3/8 (HEMOSTASIS) ×1
CANISTER SUCT 3000ML PPV (MISCELLANEOUS) ×2 IMPLANT
CATH EMB 3FR 80CM (CATHETERS) ×2 IMPLANT
CATH EMB 4FR 80CM (CATHETERS) ×2 IMPLANT
CLIP VESOCCLUDE MED 24/CT (CLIP) ×2 IMPLANT
CLIP VESOCCLUDE SM WIDE 24/CT (CLIP) ×2 IMPLANT
DERMABOND ADVANCED (GAUZE/BANDAGES/DRESSINGS) ×2
DERMABOND ADVANCED .7 DNX12 (GAUZE/BANDAGES/DRESSINGS) ×2 IMPLANT
DRAIN CHANNEL 15F RND FF W/TCR (WOUND CARE) IMPLANT
ELECT CAUTERY BLADE 6.4 (BLADE) ×2 IMPLANT
ELECT REM PT RETURN 9FT ADLT (ELECTROSURGICAL) ×2
ELECTRODE REM PT RTRN 9FT ADLT (ELECTROSURGICAL) ×1 IMPLANT
EVACUATOR SILICONE 100CC (DRAIN) IMPLANT
GAUZE SPONGE 4X4 16PLY XRAY LF (GAUZE/BANDAGES/DRESSINGS) IMPLANT
GLOVE BIO SURGEON STRL SZ7 (GLOVE) ×2 IMPLANT
GLOVE BIO SURGEON STRL SZ7.5 (GLOVE) ×4 IMPLANT
GLOVE BIOGEL PI IND STRL 6.5 (GLOVE) ×2 IMPLANT
GLOVE BIOGEL PI IND STRL 7.0 (GLOVE) ×2 IMPLANT
GLOVE BIOGEL PI IND STRL 7.5 (GLOVE) ×1 IMPLANT
GLOVE BIOGEL PI IND STRL 8.5 (GLOVE) ×2 IMPLANT
GLOVE BIOGEL PI INDICATOR 6.5 (GLOVE) ×2
GLOVE BIOGEL PI INDICATOR 7.0 (GLOVE) ×2
GLOVE BIOGEL PI INDICATOR 7.5 (GLOVE) ×1
GLOVE BIOGEL PI INDICATOR 8.5 (GLOVE) ×2
GOWN STRL REUS W/ TWL LRG LVL3 (GOWN DISPOSABLE) ×7 IMPLANT
GOWN STRL REUS W/ TWL XL LVL3 (GOWN DISPOSABLE) ×2 IMPLANT
GOWN STRL REUS W/TWL LRG LVL3 (GOWN DISPOSABLE) ×14
GOWN STRL REUS W/TWL XL LVL3 (GOWN DISPOSABLE) ×4
GRAFT HEMASHIELD 8MM (Vascular Products) ×2 IMPLANT
GRAFT VASC STRG 30X8KNIT (Vascular Products) ×1 IMPLANT
HEMOSTAT SPONGE AVITENE ULTRA (HEMOSTASIS) ×2 IMPLANT
INSERT FOGARTY SM (MISCELLANEOUS) ×2 IMPLANT
KIT BASIN OR (CUSTOM PROCEDURE TRAY) ×2 IMPLANT
KIT ROOM TURNOVER OR (KITS) ×2 IMPLANT
LOOP VESSEL MINI RED (MISCELLANEOUS) ×2 IMPLANT
NS IRRIG 1000ML POUR BTL (IV SOLUTION) ×4 IMPLANT
PACK PERIPHERAL VASCULAR (CUSTOM PROCEDURE TRAY) ×2 IMPLANT
PAD ARMBOARD 7.5X6 YLW CONV (MISCELLANEOUS) ×4 IMPLANT
PENCIL BUTTON HOLSTER BLD 10FT (ELECTRODE) ×2 IMPLANT
STAPLER VISISTAT 35W (STAPLE) IMPLANT
SUT MNCRL AB 4-0 PS2 18 (SUTURE) ×4 IMPLANT
SUT PROLENE 5 0 C 1 24 (SUTURE) ×12 IMPLANT
SUT PROLENE 6 0 BV (SUTURE) ×2 IMPLANT
SUT SILK 2 0 PERMA HAND 18 BK (SUTURE) IMPLANT
SUT VIC AB 2-0 CT1 27 (SUTURE) ×2
SUT VIC AB 2-0 CT1 TAPERPNT 27 (SUTURE) ×2 IMPLANT
SUT VIC AB 3-0 SH 27 (SUTURE) ×4
SUT VIC AB 3-0 SH 27X BRD (SUTURE) ×2 IMPLANT
SYR 3ML LL SCALE MARK (SYRINGE) ×4 IMPLANT
TRAY FOLEY W/METER SILVER 16FR (SET/KITS/TRAYS/PACK) ×2 IMPLANT
UNDERPAD 30X30 (UNDERPADS AND DIAPERS) IMPLANT
WATER STERILE IRR 1000ML POUR (IV SOLUTION) ×2 IMPLANT

## 2017-05-08 NOTE — Op Note (Signed)
Procedure: Left-to-right femoral-femoral bypass  Preoperative diagnosis: Peripheral arterial disease  Postoperative diagnosis: Same  Anesthesia: Gen.  Co-surgeon: Adele Barthel M.D.  Asst.: Samantha Rhyne PA-C  Operative findings: 8 mm Dacron graft  Operative details: After obtaining informed consent, the patient was taken to operating. The patient was placed in supine position operating table. After induction of general anesthesia and placement of Foley catheter patient's lower extremities prepped and draped in usual sterile fashion. I proceeded to make a longitudinal incision in the right groin of the right common femoral artery. The right common femoral artery was soft on palpation but with no pulse within it. It was dissected free circumferentially as well as the circumflex iliac branches the profunda femoris and superficial femoral arteries. Vessel loops were placed around all of these. The patient was given a bolus of intravenous heparin. After appropriate circulation time, the right common femoral profunda femoris and superficial femoral arteries were all controlled with vascular clamps. Longitudinal opening was made in the right common femoral artery. There was some eccentric plaque along the medial aspect of the artery but overall is fairly healthy. An 8 mm Dacron graft was previous tunneled by Dr. Bridgett Larsson. I spatulated the right limb of the graft and sewed this end of graft to side of artery using running 6-0 Prolene suture. Just prior completion of the anastomosis it was forebled backbled and thoroughly flushed. Anastomosis was secured clamps released and the graft was clamped in the left groin. The remainder of the procedure is dictated as a separate operative note by Dr. Bridgett Larsson.  Ruta Hinds, MD [facility] Office: [phone] Pager: [phone]

## 2017-05-08 NOTE — Anesthesia Procedure Notes (Signed)
Arterial Line Insertion Start/End10/30/2018 6:54 AM, 05/08/2017 7:07 AM Performed by: Freddie Breech, CRNA  Patient location: Pre-op. Preanesthetic checklist: patient identified, IV checked, site marked, risks and benefits discussed, surgical consent, monitors and equipment checked, pre-op evaluation, timeout performed and anesthesia consent Lidocaine 1% used for infiltration Left, radial was placed Catheter size: 20 Fr Hand hygiene performed  and maximum sterile barriers used  Allen's test indicative of satisfactory collateral circulation Attempts: 3 (Attempt X 2 by SRNA.) Ultrasound Notes:anatomy identified Following insertion, dressing applied and Biopatch. Post procedure assessment: normal and unchanged  Patient tolerated the procedure with difficulty.

## 2017-05-08 NOTE — Interval H&P Note (Signed)
   History and Physical Update  The patient was interviewed and re-examined.  The patient's previous History and Physical has been reviewed and is unchanged from my consult except for: interval L CIA stenting and angioplasty.  The patient has also undergone Pulmonary and Cardiology preoperative risk stratification and optimization.  The patient is left to right femorofemoral bypass.   The risk, benefits, and alternative for bypass operations were discussed with the patient.    The patient is aware the risks include but are not limited to: bleeding, infection, myocardial infarction, stroke, limb loss, nerve damage, limb edema, need for additional procedures in the future, wound complications, and inability to complete the bypass.   The patient is aware of these risks and agreed to proceed.  Adele Barthel, MD, FACS [facility] Office: [phone] Pager: [phone]  05/08/2017, 7:21 AM

## 2017-05-08 NOTE — Anesthesia Postprocedure Evaluation (Signed)
Anesthesia Post Note  Patient: Bobby Caldwell  Procedure(s) Performed: LEFT TO RIGHT BYPASS GRAFT FEMORAL-FEMORAL ARTERY (Bilateral Groin)     Patient location during evaluation: PACU Anesthesia Type: General Level of consciousness: awake, awake and alert and oriented Pain management: pain level controlled Vital Signs Assessment: post-procedure vital signs reviewed and stable Respiratory status: spontaneous breathing, nonlabored ventilation and respiratory function stable Cardiovascular status: blood pressure returned to baseline Postop Assessment: no headache Anesthetic complications: no    Last Vitals:  Vitals:   05/08/17 2000 05/08/17 2024  BP: 105/72   Pulse: 88   Resp: 18   Temp: 36.7 C   SpO2: 96% 97%    Last Pain:  Vitals:   05/08/17 2000  TempSrc: Oral  PainSc:                  Raquell Richer COKER

## 2017-05-08 NOTE — Telephone Encounter (Signed)
Pt already had an appt w/ [facility] on 05/15/17 at 9:15. Changed notes.

## 2017-05-08 NOTE — Op Note (Addendum)
OPERATIVE NOTE   PROCEDURE: 1. Left to right femorofemoral bypass  PRE-OPERATIVE DIAGNOSIS: Occupation limiting intermittent claudication with intermittent rest pain (Right > Left)  POST-OPERATIVE DIAGNOSIS: same  CO-SURGEONS: Adele Barthel, MD; Dr. Ruta Hinds  ASSISTANT(S): Leontine Locket, [facility]   ANESTHESIA: general  ESTIMATED BLOOD LOSS: 50 cc  FINDING(S): 1.  Faintly palpable right dorsalis pedis artery.  Dopplerable posterior tibial artery  2.  Palpable left dorsalis pedis artery and posterior tibial artery   SPECIMEN(S):  Left groin lymph node packet  INDICATIONS:   Bobby Caldwell is a 61 y.o. male who presents with occupation limited bilateral leg intermittent claudication with intermittent rest pain (right > left).  I took patient to angiography suite for angiogram.  There I placed a left common iliac artery stent to facilitate and left to right femorofemoral bypass as I felt this patient's severe chronic obstructive pulmonary disease would limit his ability to survive an aortobifemoral bypass.  He was admitted after his procedure and underwent preoperative pulmonary and cardiology preoperative risk stratification and optimization.  He presents today for his left to right femorofemoral bypass.  The risk, benefits, and alternative for bypass operations were discussed with the patient.  The patient is aware the risks include but are not limited to: bleeding, infection, myocardial infarction, stroke, limb loss, nerve damage, limb edema, need for additional procedures in the future, wound complications, and inability to complete the bypass.  The patient is aware of these risks and agreed to proceed.   DESCRIPTION: After obtaining full informed written consent, the patient was brought back to the operating room and placed supine upon the operating table.  The patient received IV antibiotics prior to induction.  A procedure time out was completed and the correct surgical site was  verified.  After obtaining adequate anesthesia, the patient was prepped and draped in the standard fashion for: femorofemoral bypass.  A two surgeon approach was utilized in this case to limit the general anesthesia time to give him the best chance to avoid extended intubation and ventilation given his severe COPD.  I turned my attention to the left groin.  I made a longitudinal incision over the left common femoral artery.  I dissected through the underlying subcutaneous tissue with electrocautery.  In this process, I found a packet of lymph nodes immediately overlying the common femoral artery and densely adherent.  I dissected off this lymph node packet and then resected this packet and passed it off the field as a specimen.  I then dissected out the common femoral artery, up to the distal external iliac artery and down to the superficial femoral artery and profunda femoral artery.  I placed vessel loops around each artery.    Dr. Oneida Alar similarly exposed the right groin and common femoral artery, see his Op Note for details.  I then dissected a tunnel from each groin in the subcutaneous tissue in an arch superior to the pubic bone with my fingers first and then with a ringed forceps.  A 8 mm Dacron graft was clamped with the forceps and pulled through the subcutaneous tunnel.  The patient was given 5000 units of Heparin intravenously, which was a therapeutic bolus.  Dr Oneida Alar started his anastomosis in the right groin, see his notes for details.  This allowed me to set my length in the left groin.  I pulled the left side of the Dacron graft to appropriate tension.  I then clamped the graft just as it left the tunnel to  set the appropriate tension.   I placed the superficial femoral artery and profunda femoral artery under tension and clamped the distal external iliac artery.  I made an arteriotomy in the common femoral artery and extended it proximally and distally.  I spatulated the left side of the  Dacron graft to the dimensions of the arteriotomy, shortening the graft in this process.  I sewed the graft to the common femoral artery with a running stitch of 5-0 Prolene in an end to side configuration.  After completing 50% of the anastomosis, I allowed the profunda femoral artery to bleed: vigorous backbleeding without clot.  I placed this back under tension.  I then allowed the superficial femoral artery to bleed: minimal backbleeding.  I placed this back under tension.  I had some concerns for possible thrombus, so I passed a 3 Fogarty catheter down the superficial femoral artery and the catheter passed without resistance for its entire length.  No clot was extracted from the maneuver.  I then bled the distal left external iliac artery: pulsatile bleeding was present.  I reclamped the external iliac artery.  I completed this anastomosis in this usual fashion.  I pulled up on the suture line with a nerve hook prior to completion of this anastomosis.  Of note, I did unclamp the femorofemoral graft prior to completion of this anastomosis: vigorous crossbleeding from the left groin was noted.  All clamps and vessel loops were removed.  Details of the right femoral anastomosis can be found in Dr. Oneida Alar' Op Note.  Immediately there was palpable superficial femoral artery and profunda femoral artery pulses in each groin.  Continous doppler waveforms confirmed this.  The patient was given 40 mg of Protamine to reverse anticoagulation.  I packed both groin with Avitene.  Both sets of anastomotic suture lines had to be repaired by pulling up on the suture line with a nerve hook and retightening the suture line due to relaxation of the Dacron.  This stopped the active bleeding in both groins.  Both groins were washed out and repacked with Avitene.  After a few minutes, all bleeding has ceased.  Both groins were washed out and Avitene removed.  Each groin was repaired with a double layer of 2-0 Vicryl immediately  above the graft.  The subcutaneous tissue was then reapproximated with a double layer of 3-0 Vicryl.  Finally the skin in both groins was reapproximated with a running subcuticular stitch of 4-0 Monocryl.  The skin in both groins was cleaned, dried, and reinforced with Dermabond.  I broke scrub and then examined each foot.  The left foot has palpable dorsalis pedis artery and posterior tibial artery pulses.  The right foot had a faintly palpable dorsalis pedis artery pulse that was confirmed with doppler signal.  The right posterior tibial artery had a strong bi/monophasic signal.   COMPLICATIONS: none  CONDITION: stable   Adele Barthel, MD, FACS [facility] [place] Office: [phone] Pager: [phone]  05/08/2017, 9:51 AM

## 2017-05-08 NOTE — Discharge Instructions (Signed)
° °[facility] ° °Discharge instructions ° °Lower Extremity Bypass Surgery ° °Please refer to the following instruction for your post-procedure care. Your surgeon or physician assistant will discuss any changes with you. ° °Activity ° °You are encouraged to walk as much as you can. You can slowly return to normal activities during the month after your surgery. Avoid strenuous activity and heavy lifting until your doctor tells you it's OK. Avoid activities such as vacuuming or swinging a golf club. Do not drive until your doctor give the OK and you are no longer taking prescription pain medications. It is also normal to have difficulty with sleep habits, eating and bowel movement after surgery. These will go away with time. ° °Bathing/Showering ° °You may shower after you go home. Do not soak in a bathtub, hot tub, or swim until the incision heals completely. ° °Incision Care ° °Clean your incision with mild soap and water. Shower every day. Pat the area dry with a clean towel. You do not need a bandage unless otherwise instructed. Do not apply any ointments or creams to your incision. If you have open wounds you will be instructed how to care for them or a visiting nurse may be arranged for you. If you have staples or sutures along your incision they will be removed at your post-op appointment. You may have skin glue on your incision. Do not peel it off. It will come off on its own in about one week. ° °Wash the groin wound with soap and water daily and pat dry. (No tub bath-only shower)  Then put a dry gauze or washcloth in the groin to keep this area dry to help prevent wound infection.  Do this daily and as needed.  Do not use Vaseline or neosporin on your incisions.  Only use soap and water on your incisions and then protect and keep dry. ° °Diet ° °Resume your normal diet. There are no special food restrictions following this procedure. A low fat/ low cholesterol diet is  recommended for all patients with vascular disease. In order to heal from your surgery, it is CRITICAL to get adequate nutrition. Your body requires vitamins, minerals, and protein. Vegetables are the best source of vitamins and minerals. Vegetables also provide the perfect balance of protein. Processed food has little nutritional value, so try to avoid this. ° °Medications ° °Resume taking all your medications unless your doctor or physician assistant tells you not to. If your incision is causing pain, you may take over-the-counter pain relievers such as acetaminophen (Tylenol). If you were prescribed a stronger pain medication, please aware these medication can cause nausea and constipation. Prevent nausea by taking the medication with a snack or meal. Avoid constipation by drinking plenty of fluids and eating foods with high amount of fiber, such as fruits, vegetables, and grains. Take Colace 100 mg (an over-the-counter stool softener) twice a day as needed for constipation.  °Do not take Tylenol if you are taking prescription pain medications. ° °Follow Up ° °Our office will schedule a follow up appointment 2-3 weeks following discharge. ° °Please call us immediately for any of the following conditions ° °•Severe or worsening pain in your legs or feet while at rest or while walking •Increase pain, redness, warmth, or drainage (pus) from your incision site(s) °Fever of 101 degree or higher °The swelling in your leg with the bypass suddenly worsens and becomes more painful than when you were in the hospital °If you   been instructed to feel your graft pulse then you should do so every day. If you can no longer feel this pulse, call the office immediately. Not all patients are given this instruction.   Reduce your risk of vascular disease  Stop smoking. If you would like help call [facility] at 1-800-QUIT-NOW 8380218819) or [place] at (418)187-0046.  Manage your cholesterol Maintain a desired  weight Control your diabetes weight Control your diabetes Keep your blood pressure down  If you have any questions, please call the office at [phone]

## 2017-05-08 NOTE — Anesthesia Procedure Notes (Signed)
Procedure Name: Intubation Date/Time: 05/08/2017 7:41 AM Performed by: Freddie Breech Pre-anesthesia Checklist: Patient identified, Emergency Drugs available, Suction available, Patient being monitored and Timeout performed Patient Re-evaluated:Patient Re-evaluated prior to induction Oxygen Delivery Method: Circle System Utilized Preoxygenation: Pre-oxygenation with 100% oxygen Induction Type: IV induction Ventilation: Mask ventilation without difficulty Laryngoscope Size: Mac and 4 Grade View: Grade I Tube type: Oral Number of attempts: 1 Airway Equipment and Method: Stylet Placement Confirmation: ETT inserted through vocal cords under direct vision,  positive ETCO2 and breath sounds checked- equal and bilateral Secured at: 19 cm Tube secured with: Tape Dental Injury: Teeth and Oropharynx as per pre-operative assessment  Comments: Intubation performed by M. Petraglia, SRNA

## 2017-05-08 NOTE — Transfer of Care (Signed)
Immediate Anesthesia Transfer of Care Note  Patient: Bobby Caldwell  Procedure(s) Performed: LEFT TO RIGHT BYPASS GRAFT FEMORAL-FEMORAL ARTERY (Bilateral Groin)  Patient Location: PACU  Anesthesia Type:General  Level of Consciousness: drowsy and patient cooperative  Airway & Oxygen Therapy: Patient Spontanous Breathing and Patient connected to face mask oxygen  Post-op Assessment: Report given to RN and Post -op Vital signs reviewed and stable  Post vital signs: Reviewed and stable  Last Vitals:  Vitals:   05/08/17 0614  BP: (!) 173/91  Pulse: 95  Resp: 18  Temp: 36.7 C  SpO2: 100%    Last Pain:  Vitals:   05/08/17 0614  TempSrc: Oral  PainSc: 5       Patients Stated Pain Goal: 5 (57/84/69 [id number])  Complications: No apparent anesthesia complications

## 2017-05-08 NOTE — Anesthesia Preprocedure Evaluation (Signed)
Anesthesia Evaluation  Patient identified by MRN, date of birth, ID band  Reviewed: Allergy & Precautions, NPO status , Patient's Chart, lab work & pertinent test results  Airway Mallampati: II   Neck ROM: Full    Dental  (+) Teeth Intact   Pulmonary Current Smoker,     + decreased breath sounds      Cardiovascular  Rhythm:Regular Rate:Normal     Neuro/Psych    GI/Hepatic   Endo/Other    Renal/GU      Musculoskeletal   Abdominal   Peds  Hematology   Anesthesia Other Findings   Reproductive/Obstetrics                             Anesthesia Physical Anesthesia Plan  ASA: III  Anesthesia Plan: General   Post-op Pain Management:    Induction: Intravenous  PONV Risk Score and Plan: Ondansetron and Dexamethasone  Airway Management Planned: Oral ETT  Additional Equipment: Arterial line  Intra-op Plan:   Post-operative Plan:   Informed Consent: I have reviewed the patients History and Physical, chart, labs and discussed the procedure including the risks, benefits and alternatives for the proposed anesthesia with the patient or authorized representative who has indicated his/her understanding and acceptance.   Dental advisory given  Plan Discussed with: CRNA and Anesthesiologist  Anesthesia Plan Comments:         Anesthesia Quick Evaluation

## 2017-05-08 NOTE — Telephone Encounter (Signed)
-----   Message from Mena Goes, RN sent at 05/08/2017 10:42 AM EDT ----- Regarding: 2 weeks with possible ABIs   ----- Message ----- From: Gabriel Earing, PA-C Sent: 05/08/2017  10:02 AM To: V[facility] Charge Pool  S/p fem fem bypass grafting 05/08/17.  F/u with Dr. Bridgett Larsson in 2 weeks.  If he doesn't get ABI's in the hospital, he will need them at his visit.  Thanks, Aldona Bar

## 2017-05-08 NOTE — H&P (View-Only) (Signed)
Requested by:  Dr. Teressa Lower, MD  Reason for consultation: bilateral leg pain   History of Present Illness   Bobby Caldwell is a 61 y.o. 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  History is obtained via Optometrist.  Onset of symptom occurred years ago without obvious trigger.  Pain is described as "nail sticking" from thigh down to legs, severity 3-6/10, and associated with walking short distance.  Patient has attempted to treat this pain with rest and OTC.  This patient works at [employer] around, so his sx interfere with work.  The patient has intermittent rest pain symptoms also and no leg wounds/ulcers.  Atherosclerotic risk factors include: heavy smoker.  He baseline has significant SOB related to COPD  Past Medical History: Emphysema Nicotine abuse  Past Surgical History: none   Social History   Social History  . Marital status: Single    Spouse name: N/A  . Number of children: N/A  . Years of education: N/A   Occupational History  . Not on file.   Social History Main Topics  . Smoking status: Current Every Day Smoker    Packs/day: 0.50    Years: 50.00    Types: Cigarettes  . Smokeless tobacco: Never Used  . Alcohol use Not on file  . Drug use: Unknown  . Sexual activity: Not on file   Other Topics Concern  . Not on file   Social History Narrative  . No narrative on file   Family History: patient is unable to detail the medical history of his parents  Current Outpatient Prescriptions  Medication Sig Dispense Refill  . Albuterol Sulfate (PROAIR RESPICLICK) 017 (90 Base) MCG/ACT AEPB Inhale 2 puffs into the lungs every 6 (six) hours as needed. 1 each 6  . budesonide-formoterol (SYMBICORT) 160-4.5 MCG/ACT inhaler Inhale 2 puffs into the lungs 2 (two) times daily. 1 Inhaler 6  . Tiotropium Bromide Monohydrate (SPIRIVA RESPIMAT) 2.5 MCG/ACT AERS Inhale 2 puffs into the lungs daily. 1 Inhaler 6   No  current facility-administered medications for this visit.     No Known Allergies  REVIEW OF SYSTEMS (negative unless checked):   Cardiac:  []  Chest pain or chest pressure? [x]  Shortness of breath upon activity? []  Shortness of breath when lying flat? []  Irregular heart rhythm?  Vascular:  [x]  Pain in calf, thigh, or hip brought on by walking? [x]  Pain in feet at night that wakes you up from your sleep? []  Blood clot in your veins? []  Leg swelling?  Pulmonary:  []  Oxygen at home? []  Productive cough? []  Wheezing?  Neurologic:  []  Sudden weakness in arms or legs? []  Sudden numbness in arms or legs? []  Sudden onset of difficult speaking or slurred speech? []  Temporary loss of vision in one eye? []  Problems with dizziness?  Gastrointestinal:  []  Blood in stool? []  Vomited blood?  Genitourinary:  []  Burning when urinating? []  Blood in urine?  Psychiatric:  []  Major depression  Hematologic:  []  Bleeding problems? []  Problems with blood clotting?  Dermatologic:  []  Rashes or ulcers?  Constitutional:  []  Fever or chills?  Ear/Nose/Throat:  []  Change in hearing? []  Nose bleeds? []  Sore throat?  Musculoskeletal:  []  Back pain? []  Joint pain? []  Muscle pain?   For VQI Use Only   PRE-ADM LIVING Home  AMB STATUS Ambulatory  CAD Sx None  PRIOR CHF None  STRESS TEST No    Physical Examination  Vitals:   04/27/17 1112  BP: (!) 146/100  Pulse: 81  Resp: 16  Temp: 97.8 F (36.6 C)  TempSrc: Oral  SpO2: 95%  Weight: 109 lb (49.4 kg)  Height: 5\' 7"  (1.702 m)   Body mass index is 17.07 kg/m.  General Alert, O x 3, WD, Ill appearing, thin  Head [place]/AT,    Ear/Nose/ Throat Hearing grossly intact, nares without erythema or drainage, oropharynx without Erythema or Exudate, Mallampati score: 3,   Eyes PERRLA, EOMI,    Neck Supple, mid-line trachea,    Pulmonary Sym exp, good B air movt, CTA B  Cardiac RRR, Nl S1, S2, no Murmurs, No rubs, No  S3,S4  Vascular Vessel Right Left  Radial Palpable Palpable  Brachial Palpable Palpable  Carotid Palpable, No Bruit Palpable, No Bruit  Aorta Not palpable N/A  Femoral Not palpable Faintly palpable  Popliteal Not palpable Not palpable  PT Not palpable Not palpable  DP Not palpable Not palpable    Gastro- intestinal soft, non-distended, non-tender to palpation, No guarding or rebound, no HSM, no masses, no CVAT B, No palpable prominent aortic pulse,    Musculo- skeletal M/S 5/5 throughout  , Extremities without ischemic changes  , No edema present, Varicosities present: L>>R, No Lipodermatosclerosis present  Neurologic Cranial nerves 2-12 intact , Pain and light touch intact in extremities , Motor exam as listed above  Psychiatric Judgement intact, Mood & affect appropriate for pt's clinical situation  Dermatologic See M/S exam for extremity exam, No rashes otherwise noted  Lymphatic  Palpable lymph nodes: None    Non-Invasive Vascular imaging   Outside BLE ABI (04/19/17)  R:   ABI: 0.46,   PT: mono  DP: mono   Pero: mono  TBI:  0.32  L:   ABI: 0.53,   PT: mono  DP: mono   Pero: Mono  TBI: 0.47   Radiology     CT Abd/pelvis (04/02/17): 1. Aortic atherosclerosis with fusiform ectasia of the infrarenal abdominal aorta which measures up to 2.6 x 2.3 cm, and contains a large burden of mural thrombus. 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 2. Today's study was not a CTA examination, which limits assessment of vascular abnormalities. Additionally, secondary to extensive patient respiratory motion, accurate assessment of small vessels is limited. With these limitations in mind, there is potential stenosis of both the origin of the celiac axis and the origin of the inferior mesenteric artery. Given the patient's history of intermittent abdominal pain, further evaluation  with CTA of the abdomen and pelvis should be considered if clinically appropriate, and if the patient can be counseled to adequately hold his breath during the examination to allow for optimal image quality. 3. No other acute findings are noted in the chest, abdomen or pelvis to account for the patient's symptoms. 4. Diffuse bronchial wall thickening with moderate centrilobular and paraseptal emphysema; imaging findings suggestive of underlying COPD. 5. Multiple small pulmonary nodules measuring up to 6 mm, stable compared to prior examinations dating back take 02/22/2015, considered benign. No future follow-up needed. This recommendation follows the consensus statement: Guidelines for Management of Incidental Pulmonary Nodules Detected on CT Images: From the Fleischner Society 2017; Radiology 2017; 284:228-243. 6. Additional incidental findings, as above. Aortic Atherosclerosis (ICD10-I70.0) and Emphysema (ICD10-J43.9).   Outside Studies/Documentation   5 pages of outside documents were reviewed including: outpatient PCP chart    Medical Decision Making  Bobby Caldwell is a 61 y.o. male who presents with: occupation limiting R>>L intermittent claudication, known R CIA occlusion, L CIA stenosis, COPD with emphysema   Based on this patient's history and physical exam, I recommend: aortogram, bilateral runoff, and possible intervention  I discussed with the patient the nature of angiographic procedures, especially the limited patencies of any endovascular intervention.   The patient is aware of that the risks of an angiographic procedure include but are not limited to: bleeding, infection, access site complications, renal failure, embolization, rupture of vessel, dissection, arteriovenous fistula, possible need for emergent surgical intervention, possible need for surgical procedures to treat the patient's pathology, anaphylactic reaction to contrast, and stroke and death.   The patient  is aware of the risks and agrees to proceed. The patient is scheduled for the 25 OCT 18 The patient is currently not on on statin as not medically indicated.  The patient is currently not on an anti-platelet. Patient will be started on ASA 81 mg PO daily  Thank you for allowing [place] to participate in this patient's care.   Adele Barthel, MD, FACS [facility] Office: [phone] Pager: [phone]  04/26/2017, 8:22 PM

## 2017-05-09 ENCOUNTER — Encounter (HOSPITAL_COMMUNITY): Payer: Self-pay | Admitting: Vascular Surgery

## 2017-05-09 LAB — BASIC METABOLIC PANEL
Anion gap: 6 (ref 5–15)
BUN: 9 mg/dL (ref 6–20)
CO2: 28 mmol/L (ref 22–32)
Calcium: 8.2 mg/dL — ABNORMAL LOW (ref 8.9–10.3)
Chloride: 102 mmol/L (ref 101–111)
Creatinine, Ser: 0.5 mg/dL — ABNORMAL LOW (ref 0.61–1.24)
GFR calc Af Amer: >60 mL/min (ref >60)
GLUCOSE: 130 mg/dL — AB (ref 65–99)
POTASSIUM: 3.9 mmol/L (ref 3.5–5.1)
Sodium: 136 mmol/L (ref 135–145)

## 2017-05-09 LAB — CBC
HCT: 35.1 % — ABNORMAL LOW (ref 39.0–52.0)
Hemoglobin: 11.9 g/dL — ABNORMAL LOW (ref 13.0–17.0)
MCH: 29.5 pg (ref 26.0–34.0)
MCHC: 33.9 g/dL (ref 30.0–36.0)
MCV: 87.1 fL (ref 78.0–100.0)
PLATELETS: 255 10*3/uL (ref 150–400)
RBC: 4.03 MIL/uL — AB (ref 4.22–5.81)
RDW: 13.1 % (ref 11.5–15.5)
WBC: 14.5 10*3/uL — ABNORMAL HIGH (ref 4.0–10.5)

## 2017-05-09 NOTE — Discharge Summary (Signed)
Discharge Summary     Bobby Caldwell 05-31-1956 61 y.o. male  [id number]  Admission Date: 05/08/2017  Discharge Date: 05/09/17  Physician: Conrad [place], MD  Admission Diagnosis: PAD (peripheral artery disease) ([place]) [I73.9]   HPI:   This is a 61 y.o. male with severe emphysema who presents with chief complaint: B leg pain.  History is obtained via Optometrist.  Onset of symptom occurred years ago without obvious trigger.  Pain is described as "nail sticking" from thigh down to legs, severity 3-6/10, and associated with walking short distance.  Patient has attempted to treat this pain with rest and OTC.  This patient works at [employer] around, so his sx interfere with work.  The patient has intermittent rest pain symptoms also and no leg wounds/ulcers.  Atherosclerotic risk factors include: heavy smoker.  He baseline has significant SOB related to COPD  Hospital Course:  The patient was admitted to the hospital and taken to the operating room on 05/08/2017 and underwent: Left to right femorofemoral bypass     FINDING(S): 1.  Faintly palpable right dorsalis pedis artery.  Dopplerable posterior tibial artery  2.  Palpable left dorsalis pedis artery and posterior tibial artery   The pt tolerated the procedure well and was transported to the PACU in good condition.   In the recovery room, he had a palpable left DP and +doppler signals in the right DP/PT.  By POD 1, he was doing well.    His foley catheter is removed.  He was able to void without difficulty.  He was weaned to room air and ambulated.   Pt was started on low dose lipitor.  The remainder of the hospital course consisted of increasing mobilization and increasing intake of solids without difficulty.  CBC    Component Value Date/Time   WBC 14.5 (H) 05/09/2017 0435   RBC 4.03 (L) 05/09/2017 0435   HGB 11.9 (L) 05/09/2017 0435   HCT 35.1 (L) 05/09/2017 0435   PLT 255 05/09/2017 0435   MCV 87.1  05/09/2017 0435   MCH 29.5 05/09/2017 0435   MCHC 33.9 05/09/2017 0435   RDW 13.1 05/09/2017 0435   LYMPHSABS 2.0 03/26/2017 1040   MONOABS 0.6 03/26/2017 1040   EOSABS 0.2 03/26/2017 1040   BASOSABS 0.1 03/26/2017 1040    BMET    Component Value Date/Time   NA 136 05/09/2017 0435   K 3.9 05/09/2017 0435   CL 102 05/09/2017 0435   CO2 28 05/09/2017 0435   GLUCOSE 130 (H) 05/09/2017 0435   BUN 9 05/09/2017 0435   CREATININE 0.50 (L) 05/09/2017 0435   CALCIUM 8.2 (L) 05/09/2017 0435   GFRNONAA >60 05/09/2017 0435   GFRAA >60 05/09/2017 0435       Discharge Diagnosis:  PAD (peripheral artery disease) ([place]) [I73.9]  Secondary Diagnosis: Patient Active Problem List   Diagnosis Date Noted  . PAD (peripheral artery disease) ([place]) 05/08/2017  . Atherosclerosis of native arteries of extremity with intermittent claudication ([place]) 05/03/2017  . Critical lower limb ischemia 05/03/2017  . Abnormal CT of the chest 08/02/2015  . Underweight 03/30/2015  . COPD (chronic obstructive pulmonary disease) with emphysema ([place]) 02/16/2015  . Weight loss 02/16/2015  . Cigarette smoker 02/16/2015   Past Medical History:  Diagnosis Date  . COPD (chronic obstructive pulmonary disease) ([place])   . Dyspnea   . PAD (peripheral artery disease) ([place])   . Tobacco abuse      Allergies as of 05/09/2017   No  Known Allergies     Medication List    TAKE these medications   Albuterol Sulfate 108 (90 Base) MCG/ACT Aepb Commonly known as:  PROAIR RESPICLICK Inhale 2 puffs into the lungs every 6 (six) hours as needed.   atorvastatin 10 MG tablet Commonly known as:  LIPITOR Take 1 tablet (10 mg total) by mouth daily.   budesonide-formoterol 160-4.5 MCG/ACT inhaler Commonly known as:  SYMBICORT Inhale 2 puffs into the lungs 2 (two) times daily.   clopidogrel 75 MG tablet Commonly known as:  PLAVIX Take 1 tablet (75 mg total) by mouth daily.   oxyCODONE-acetaminophen 5-325 MG  tablet Commonly known as:  ROXICET Take 1 tablet by mouth every 6 (six) hours as needed for severe pain.   Tiotropium Bromide Monohydrate 2.5 MCG/ACT Aers Commonly known as:  SPIRIVA RESPIMAT Inhale 2 puffs into the lungs daily.       Discharge Instructions: [facility] Discharge instructions Lower Extremity Bypass Surgery  Please refer to the following instruction for your post-procedure care. Your surgeon or physician assistant will discuss any changes with you.  Activity  You are encouraged to walk as much as you can. You can slowly return to normal activities during the month after your surgery. Avoid strenuous activity and heavy lifting until your doctor tells you it's OK. Avoid activities such as vacuuming or swinging a golf club. Do not drive until your doctor give the OK and you are no longer taking prescription pain medications. It is also normal to have difficulty with sleep habits, eating and bowel movement after surgery. These will go away with time.  Bathing/Showering  You may shower after you go home. Do not soak in a bathtub, hot tub, or swim until the incision heals completely.  Incision Care  Clean your incision with mild soap and water. Shower every day. Pat the area dry with a clean towel. You do not need a bandage unless otherwise instructed. Do not apply any ointments or creams to your incision. If you have open wounds you will be instructed how to care for them or a visiting nurse may be arranged for you. If you have staples or sutures along your incision they will be removed at your post-op appointment. You may have skin glue on your incision. Do not peel it off. It will come off on its own in about one week.  Wash the groin wound with soap and water daily and pat dry. (No tub bath-only shower)  Then put a dry gauze or washcloth in the groin to keep this area dry to help prevent wound infection.  Do this daily and as needed.  Do not  use Vaseline or neosporin on your incisions.  Only use soap and water on your incisions and then protect and keep dry.  Diet  Resume your normal diet. There are no special food restrictions following this procedure. A low fat/ low cholesterol diet is recommended for all patients with vascular disease. In order to heal from your surgery, it is CRITICAL to get adequate nutrition. Your body requires vitamins, minerals, and protein. Vegetables are the best source of vitamins and minerals. Vegetables also provide the perfect balance of protein. Processed food has little nutritional value, so try to avoid this.  Medications  Resume taking all your medications unless your doctor or Physician Assistant tells you not to. If your incision is causing pain, you may take over-the-counter pain relievers such as acetaminophen (Tylenol). If you were prescribed a  stronger pain medication, please aware these medication can cause nausea and constipation. Prevent nausea by taking the medication with a snack or meal. Avoid constipation by drinking plenty of fluids and eating foods with high amount of fiber, such as fruits, vegetables, and grains. Take Colace 100 mg (an over-the-counter stool softener) twice a day as needed for constipation. Do not take Tylenol if you are taking prescription pain medications.  Follow Up  Our office will schedule a follow up appointment 2-3 weeks following discharge.  Please call us immediately for any of the following conditions  .Severe or worsening pain in your legs or feet while at rest or while walking .Increase pain, redness, warmth, or drainage (pus) from your incision site(s) Fever of 101 degree or higher The swelling in your leg with the bypass suddenly worsens and becomes more painful than when you were in the hospital If you have been instructed to feel your graft pulse then you should do so every day. If you can no longer feel this pulse, call the office immediately. Not all  patients are given this instruction.  Leg swelling is common after leg bypass surgery.  The swelling should improve over a few months following surgery. To improve the swelling, you may elevate your legs above the level of your heart while you are sitting or resting. Your surgeon or physician assistant may ask you to apply an ACE wrap or wear compression (TED) stockings to help to reduce swelling.  Reduce your risk of vascular disease  Stop smoking. If you would like help call [facility] at 1-800-QUIT-NOW (361)642-5977) or [place] at 631-836-4974.  Manage your cholesterol Maintain a desired weight Control your diabetes weight Control your diabetes Keep your blood pressure down  If you have any questions, please call the office at [phone]   Prescriptions given: 1.  Roxicet #20 No Refill 2.  Lipitor 10mg  daily #30 2RF (further refills per PCP)  Disposition: home  Patient's condition: is Good  Follow up: 1. Dr. Bridgett Larsson in 2 weeks with ABI's   Leontine Locket, PA-C [facility] [phone] 05/09/2017  8:05 AM   Addendum  I have independently interviewed and examined the patient, and I agree with the physician assistant's findings.  Pt underwent uneventful L to R fem-fem bypass.  Pt requested discharge on POD #1.  Perfusion was greatly improved in R leg post-op.  Pt meets D/C criteria  - ABI on follow up in 2 weeks.  Adele Barthel, MD, FACS [facility] [place] Office: [phone] Pager: [phone]  05/09/2017, 1:28 PM    - For VQI Registry use ---   Post-op:  Wound infection: No  Graft infection: No  Transfusion: No    If yes, n/a units given New Arrhythmia: No Ipsilateral amputation: No, [ ]  Minor, [ ]  BKA, [ ]  AKA Discharge patency: [x ] Primary, [ ]  Primary assisted, [ ]  Secondary, [ ]  Occluded Patency judged by: [x ] Dopper only-right foot, [ ]  Palpable graft pulse, [x]  Palpable distal pulse left foot, [ ]   ABI inc. > 0.15, [ ]  Duplex Discharge ABI: R not done, L  D/C Ambulatory Status: Ambulatory  Complications: MI: No, [ ]  Troponin only, [ ]  EKG or Clinical CHF: No Resp failure:No, [ ]  Pneumonia, [ ]  Ventilator Chg in renal function: No, [ ]  Inc. Cr > 0.5, [ ]  Temp. Dialysis,  [ ]  Permanent dialysis Stroke: No, [ ]  Minor, [ ]  Major Return to OR: No  Reason for  return to OR: [ ]  Bleeding, [ ]  Infection, [ ]  Thrombosis, [ ]  Revision  Discharge medications: Statin use:  yes ASA use:  no Plavix use:  yes Beta blocker use: no CCB use:  No ACEI use:   no ARB use:  no Coumadin use: no

## 2017-05-09 NOTE — Progress Notes (Signed)
Pt seen by Dr. Bridgett Larsson this am and okay for discharge as long as he voids and is weaned to RA.   Pt is not complaining of pain, has walked in the room, is on room air and has voided.   Will discharge home.   Leontine Locket, [facility] 05/09/2017 10:48 AM

## 2017-05-09 NOTE — Progress Notes (Signed)
Pt given discharge instructions. Used interpreter. ID number [id number]. Gave prescriptions, including oxycodone. IV removed.   Clyde Canterbury, RN

## 2017-05-09 NOTE — Progress Notes (Signed)
PT Cancellation Note  Patient Details Name: Denver Bentson MRN: [id number] DOB: [birth year]   Cancelled Treatment:    Reason Eval/Treat Not Completed: PT screened, no needs identified, will sign off. Nursing reports pt able to amb in halls without problems.   Shary Decamp [place] 05/09/2017, 11:53 AM Suanne Marker PT [phone]

## 2017-05-09 NOTE — Progress Notes (Signed)
Pt walked 250 ft in hallway with no complaints. Oxygen saturation 96%.   Clyde Canterbury, RN

## 2017-05-09 NOTE — Progress Notes (Signed)
   Daily Progress Note   Assessment/Planning:   POD #1 s/p L to R fem-fem BPG   Both feet with palpable pulse (L>>R)  Pt would like to be D/C   Subjective  - 1 Day Post-Op   Pain controlled   Objective   Vitals:   05/09/17 0821 05/09/17 0909 05/09/17 1100 05/09/17 1158  BP: (!) 114/92  (!) 114/92 94/65  Pulse: 99   74  Resp: (!) 26   20  Temp: 98 F (36.7 C)  98 F (36.7 C) 98.2 F (36.8 C)  TempSrc: Oral  Oral Oral  SpO2: 95% 94%  95%  Weight:   108 lb 0.4 oz (49 kg)   Height:   5\' 7"  (1.702 m)      Intake/Output Summary (Last 24 hours) at 05/09/17 1326 Last data filed at 05/09/17 0823  Gross per 24 hour  Intake              925 ml  Output             1300 ml  Net             -375 ml    PULM  CTAB  CV  RRR  GI  soft, NTND  VASC B inc c/d/i, palpable fem-fem pulse, L pedal easily palpable, R DP and PT faintly palp  NEURO DNVI    Laboratory   CBC CBC Latest Ref Rng & Units 05/09/2017 05/08/2017 05/04/2017  WBC 4.0 - 10.5 K/uL 14.5(H) 8.1 9.6  Hemoglobin 13.0 - 17.0 g/dL 11.9(L) 13.4 13.6  Hematocrit 39.0 - 52.0 % 35.1(L) 39.4 40.4  Platelets 150 - 400 K/uL 255 212 179    BMET    Component Value Date/Time   NA 136 05/09/2017 0435   K 3.9 05/09/2017 0435   CL 102 05/09/2017 0435   CO2 28 05/09/2017 0435   GLUCOSE 130 (H) 05/09/2017 0435   BUN 9 05/09/2017 0435   CREATININE 0.50 (L) 05/09/2017 0435   CALCIUM 8.2 (L) 05/09/2017 0435   GFRNONAA >60 05/09/2017 0435   GFRAA >60 05/09/2017 0435     Adele Barthel, MD, FACS [facility] [place] Office: [phone] Pager: [phone]  05/09/2017, 1:26 PM

## 2017-05-09 NOTE — Care Management Note (Signed)
Case Management Note Marvetta Gibbons RN, BSN Unit 4E-Case Manager [phone]  Patient Details  Name: Bobby Caldwell MRN: [id number] Date of Birth: 1956/03/30  Subjective/Objective:  Pt admitted s/p Left to right femorofemoral bypass                  Action/Plan: PTA pt lived at home with spouse- speaks albanian- plan to d/c home today- no CM needs noted for discharge.  Expected Discharge Date:  05/09/17               Expected Discharge Plan:  Home/Self Care  In-House Referral:  NA  Discharge planning Services  CM Consult  Post Acute Care Choice:  NA Choice offered to:  NA  DME Arranged:    DME Agency:     HH Arranged:    HH Agency:     Status of Service:  Completed, signed off  If discussed at [place] of Stay Meetings, dates discussed:    Discharge Disposition: home/self care   Additional Comments:  Dawayne Patricia, RN 05/09/2017, 11:45 AM

## 2017-05-09 NOTE — Progress Notes (Signed)
OT Cancellation Note  Patient Details Name: Bobby Caldwell MRN: [id number] DOB: [birth year]   Cancelled Treatment:    Reason Eval/Treat Not Completed: OT screened, no needs identified, will sign off. Per nursing, pt able to complete ADL in hospital setting without difficulty. No OT needs identified. Will sign off.   Norman Herrlich, MS OTR/L  Pager: [phone]   Norman Herrlich 05/09/2017, 12:23 PM

## 2017-05-11 ENCOUNTER — Other Ambulatory Visit: Payer: Self-pay

## 2017-05-11 ENCOUNTER — Telehealth: Payer: Self-pay | Admitting: Vascular Surgery

## 2017-05-11 DIAGNOSIS — I739 Peripheral vascular disease, unspecified: Secondary | ICD-10-CM

## 2017-05-11 DIAGNOSIS — Z48812 Encounter for surgical aftercare following surgery on the circulatory system: Secondary | ICD-10-CM

## 2017-05-11 NOTE — Telephone Encounter (Signed)
Sched ABI  05/14/17 at 11:00. Pt already called.

## 2017-05-11 NOTE — Telephone Encounter (Signed)
-----   Message from Mena Goes, RN sent at 05/09/2017  9:37 AM EDT ----- Regarding: addendum to previous message    ----- Message ----- From: Natividad Brood Sent: 05/09/2017   8:10 AM To: [facility] Charge Pool  This pt is going home today and will need ABI's when he returns for his f/u visit with Dr. Bridgett Larsson in 2 weeks.  Thanks

## 2017-05-14 ENCOUNTER — Encounter (HOSPITAL_COMMUNITY): Payer: BLUE CROSS/BLUE SHIELD

## 2017-05-15 ENCOUNTER — Ambulatory Visit (INDEPENDENT_AMBULATORY_CARE_PROVIDER_SITE_OTHER): Payer: BLUE CROSS/BLUE SHIELD | Admitting: Gastroenterology

## 2017-05-15 ENCOUNTER — Encounter: Payer: Self-pay | Admitting: Gastroenterology

## 2017-05-15 VITALS — BP 102/70 | HR 100 | Ht 65.0 in | Wt 107.0 lb

## 2017-05-15 DIAGNOSIS — R634 Abnormal weight loss: Secondary | ICD-10-CM

## 2017-05-15 DIAGNOSIS — R6881 Early satiety: Secondary | ICD-10-CM | POA: Diagnosis not present

## 2017-05-15 NOTE — Progress Notes (Signed)
HPI: This is a  Very pleasant Albanian speaking 61 year old man whom I am meeting for the first time today. I'm not sure who referred him here. I think he self-referred.  Chief complaint is Anorexia, weight loss  No abdominal pains.  He has very poor appetite for 2 years.  HAs been losing weight.  Lately feels nauseas with small meals.    He can really only eat about half of his normal meals.  Has lost 14 pounds in past 4 months.    He takes no nsaids.  No hisotry of gastric probvlems, no ulcers.  No dysphagia.  He has mild chronic constipation. He sees no blood in his stool.  A week or 2 ago he had femorofemoral bypass. He is still pretty sore from that.   Old Data Reviewed: Blood tests September 2018 TSH was normal, hemoglobin A1c was 5.5, basic metabolic profile was essentially normal, CBC was normal as well.  Ct scan chest, abd, pelvis 03/2017: 1. Aortic atherosclerosis with fusiform ectasia of the infrarenal abdominal aorta which measures up to 2.6 x 2.3 cm, and contains a large burden of mural thrombus. 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 2. Today's study was not a CTA examination, which limits assessment of vascular abnormalities. Additionally, secondary to extensive patient respiratory motion, accurate assessment of small vessels is limited. With these limitations in mind, there is potential stenosis of both the origin of the celiac axis and the origin of the inferior mesenteric artery. Given the patient's history of intermittent abdominal pain, further evaluation with CTA of the abdomen and pelvis should be considered if clinically appropriate, and if the patient can be counseled to adequately hold his breath during the examination to allow for optimal image quality. 3. No other acute findings are noted in the chest, abdomen or pelvis to account for the patient's  symptoms. 4. Diffuse bronchial wall thickening with moderate centrilobular and paraseptal emphysema; imaging findings suggestive of underlying COPD. 5. Multiple small pulmonary nodules measuring up to 6 mm, stable compared to prior examinations dating back take 02/22/2015, considered benign. No future follow-up needed. This recommendation follows the consensus statement: Guidelines for Management of Incidental Pulmonary Nodules Detected on CT Images: From the Fleischner Society 2017; Radiology 2017; 284:228-243. 6. Additional incidental findings, as above. Aortic Atherosclerosis (ICD10-I70.0) and Emphysema (ICD10-J43.9).   he ended up meeting with a vascular surgeon and about 3 weeks ago had an abdominal aortogram with lower extremity angiogram as well. A stent was placed and then he underwent left to right fem fem bypass. He was to get pulmonary and cardiology "optimization" prior to that surgery.   he is felt By his pulmonologist to have "severe mixed COPD, chronic bronchitis and emphysema" and as of 2 months ago was still smoking.    Review of systems: Pertinent positive and negative review of systems were noted in the above HPI section. All other review negative.   Past Medical History:  Diagnosis Date  . COPD (chronic obstructive pulmonary disease) ([place])   . Dyspnea   . PAD (peripheral artery disease) ([place])   . Tobacco abuse     History reviewed. No pertinent surgical history.  Current Outpatient Medications  Medication Sig Dispense Refill  . Albuterol Sulfate (PROAIR RESPICLICK) 144 (90 Base) MCG/ACT AEPB Inhale 2 puffs into the lungs every 6 (six) hours as needed. 1 each 6  . atorvastatin (LIPITOR) 10 MG tablet Take  1 tablet (10 mg total) by mouth daily. 30 tablet 2  . budesonide-formoterol (SYMBICORT) 160-4.5 MCG/ACT inhaler Inhale 2 puffs into the lungs 2 (two) times daily. 1 Inhaler 6  . clopidogrel (PLAVIX) 75 MG tablet Take 1 tablet (75 mg total) by mouth daily. 30  tablet 11  . oxyCODONE-acetaminophen (ROXICET) 5-325 MG tablet Take 1 tablet by mouth every 6 (six) hours as needed for severe pain. 20 tablet 0  . Tiotropium Bromide Monohydrate (SPIRIVA RESPIMAT) 2.5 MCG/ACT AERS Inhale 2 puffs into the lungs daily. 1 Inhaler 6   No current facility-administered medications for this visit.     Allergies as of 05/15/2017  . (No Known Allergies)    Family History  Family history unknown: Yes    Social History   Socioeconomic History  . Marital status: Single    Spouse name: Not on file  . Number of children: 4  . Years of education: Not on file  . Highest education level: Not on file  Social Needs  . Financial resource strain: Not on file  . Food insecurity - worry: Not on file  . Food insecurity - inability: Not on file  . Transportation needs - medical: Not on file  . Transportation needs - non-medical: Not on file  Occupational History  . Not on file  Tobacco Use  . Smoking status: Current Every Day Smoker    Packs/day: 0.50    Years: 50.00    Pack years: 25.00    Types: Cigarettes  . Smokeless tobacco: Never Used  . Tobacco comment: 10 cigarettes per day  Substance and Sexual Activity  . Alcohol use: No    Alcohol/week: 0.0 oz  . Drug use: No  . Sexual activity: Not on file  Other Topics Concern  . Not on file  Social History Narrative  . Not on file     Physical Exam: BP 102/70 (BP Location: Left Arm, Patient Position: Sitting, Cuff Size: Normal)   Pulse 100   Ht 5\' 5"  (1.651 m) Comment: height measured without shoes  Wt 107 lb (48.5 kg)   BMI 17.81 kg/m  Constitutional: Cachectic appearing Psychiatric: alert and oriented x3 Eyes: extraocular movements intact Mouth: oral pharynx moist, no lesions Neck: supple no lymphadenopathy Cardiovascular: heart regular rate and rhythm Lungs: clear to auscultation bilaterally Abdomen: soft, nontender, nondistended, no obvious ascites, no peritoneal signs, normal bowel  sounds Extremities: no lower extremity edema bilaterally Skin: no lesions on visible extremities   Assessment and plan: 61 y.o. male with  Severe COPD, severe peripheral vascular disease status post recent femorofemoral bypass as well as vascular stenting, 1-2 years of anorexia, early satiety and weight loss  Is clearly having difficulty with his appetite and with early satiety. He has lost 14 pounds the past several months. Possibly some of this is vascular related. Perhaps his GI symptoms are reflection on his severe vascular disease to his legs, severe pulmonary disease. He is cachectic appearing. I recommended we start the workup with upper endoscopy at his soonest convenience. He will not need to hold Plavix for that.  If there is no clear etiology on the upper endoscopy then he would probably require further workup.  Please see the "Patient Instructions" section for addition details about the plan.   Owens Loffler, MD [facility] 05/15/2017, 10:19 AM  Cc: No ref. provider found

## 2017-05-15 NOTE — H&P (View-Only) (Signed)
HPI: This is a  Very pleasant Albanian speaking 61 year old man whom I am meeting for the first time today. I'm not sure who referred him here. I think he self-referred.  Chief complaint is Anorexia, weight loss  No abdominal pains.  He has very poor appetite for 2 years.  HAs been losing weight.  Lately feels nauseas with small meals.    He can really only eat about half of his normal meals.  Has lost 14 pounds in past 4 months.    He takes no nsaids.  No hisotry of gastric probvlems, no ulcers.  No dysphagia.  He has mild chronic constipation. He sees no blood in his stool.  A week or 2 ago he had femorofemoral bypass. He is still pretty sore from that.   Old Data Reviewed: Blood tests September 2018 TSH was normal, hemoglobin A1c was 5.5, basic metabolic profile was essentially normal, CBC was normal as well.  Ct scan chest, abd, pelvis 03/2017: 1. Aortic atherosclerosis with fusiform ectasia of the infrarenal abdominal aorta which measures up to 2.6 x 2.3 cm, and contains a large burden of mural thrombus. 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 2. Today's study was not a CTA examination, which limits assessment of vascular abnormalities. Additionally, secondary to extensive patient respiratory motion, accurate assessment of small vessels is limited. With these limitations in mind, there is potential stenosis of both the origin of the celiac axis and the origin of the inferior mesenteric artery. Given the patient's history of intermittent abdominal pain, further evaluation with CTA of the abdomen and pelvis should be considered if clinically appropriate, and if the patient can be counseled to adequately hold his breath during the examination to allow for optimal image quality. 3. No other acute findings are noted in the chest, abdomen or pelvis to account for the patient's  symptoms. 4. Diffuse bronchial wall thickening with moderate centrilobular and paraseptal emphysema; imaging findings suggestive of underlying COPD. 5. Multiple small pulmonary nodules measuring up to 6 mm, stable compared to prior examinations dating back take 02/22/2015, considered benign. No future follow-up needed. This recommendation follows the consensus statement: Guidelines for Management of Incidental Pulmonary Nodules Detected on CT Images: From the Fleischner Society 2017; Radiology 2017; 284:228-243. 6. Additional incidental findings, as above. Aortic Atherosclerosis (ICD10-I70.0) and Emphysema (ICD10-J43.9).   he ended up meeting with a vascular surgeon and about 3 weeks ago had an abdominal aortogram with lower extremity angiogram as well. A stent was placed and then he underwent left to right fem fem bypass. He was to get pulmonary and cardiology "optimization" prior to that surgery.   he is felt By his pulmonologist to have "severe mixed COPD, chronic bronchitis and emphysema" and as of 2 months ago was still smoking.    Review of systems: Pertinent positive and negative review of systems were noted in the above HPI section. All other review negative.   Past Medical History:  Diagnosis Date  . COPD (chronic obstructive pulmonary disease) ([place])   . Dyspnea   . PAD (peripheral artery disease) ([place])   . Tobacco abuse     History reviewed. No pertinent surgical history.  Current Outpatient Medications  Medication Sig Dispense Refill  . Albuterol Sulfate (PROAIR RESPICLICK) 188 (90 Base) MCG/ACT AEPB Inhale 2 puffs into the lungs every 6 (six) hours as needed. 1 each 6  . atorvastatin (LIPITOR) 10 MG tablet Take  1 tablet (10 mg total) by mouth daily. 30 tablet 2  . budesonide-formoterol (SYMBICORT) 160-4.5 MCG/ACT inhaler Inhale 2 puffs into the lungs 2 (two) times daily. 1 Inhaler 6  . clopidogrel (PLAVIX) 75 MG tablet Take 1 tablet (75 mg total) by mouth daily. 30  tablet 11  . oxyCODONE-acetaminophen (ROXICET) 5-325 MG tablet Take 1 tablet by mouth every 6 (six) hours as needed for severe pain. 20 tablet 0  . Tiotropium Bromide Monohydrate (SPIRIVA RESPIMAT) 2.5 MCG/ACT AERS Inhale 2 puffs into the lungs daily. 1 Inhaler 6   No current facility-administered medications for this visit.     Allergies as of 05/15/2017  . (No Known Allergies)    Family History  Family history unknown: Yes    Social History   Socioeconomic History  . Marital status: Single    Spouse name: Not on file  . Number of children: 4  . Years of education: Not on file  . Highest education level: Not on file  Social Needs  . Financial resource strain: Not on file  . Food insecurity - worry: Not on file  . Food insecurity - inability: Not on file  . Transportation needs - medical: Not on file  . Transportation needs - non-medical: Not on file  Occupational History  . Not on file  Tobacco Use  . Smoking status: Current Every Day Smoker    Packs/day: 0.50    Years: 50.00    Pack years: 25.00    Types: Cigarettes  . Smokeless tobacco: Never Used  . Tobacco comment: 10 cigarettes per day  Substance and Sexual Activity  . Alcohol use: No    Alcohol/week: 0.0 oz  . Drug use: No  . Sexual activity: Not on file  Other Topics Concern  . Not on file  Social History Narrative  . Not on file     Physical Exam: BP 102/70 (BP Location: Left Arm, Patient Position: Sitting, Cuff Size: Normal)   Pulse 100   Ht 5\' 5"  (1.651 m) Comment: height measured without shoes  Wt 107 lb (48.5 kg)   BMI 17.81 kg/m  Constitutional: Cachectic appearing Psychiatric: alert and oriented x3 Eyes: extraocular movements intact Mouth: oral pharynx moist, no lesions Neck: supple no lymphadenopathy Cardiovascular: heart regular rate and rhythm Lungs: clear to auscultation bilaterally Abdomen: soft, nontender, nondistended, no obvious ascites, no peritoneal signs, normal bowel  sounds Extremities: no lower extremity edema bilaterally Skin: no lesions on visible extremities   Assessment and plan: 61 y.o. male with  Severe COPD, severe peripheral vascular disease status post recent femorofemoral bypass as well as vascular stenting, 1-2 years of anorexia, early satiety and weight loss  Is clearly having difficulty with his appetite and with early satiety. He has lost 14 pounds the past several months. Possibly some of this is vascular related. Perhaps his GI symptoms are reflection on his severe vascular disease to his legs, severe pulmonary disease. He is cachectic appearing. I recommended we start the workup with upper endoscopy at his soonest convenience. He will not need to hold Plavix for that.  If there is no clear etiology on the upper endoscopy then he would probably require further workup.  Please see the "Patient Instructions" section for addition details about the plan.   Owens Loffler, MD [facility] 05/15/2017, 10:19 AM  Cc: No ref. provider found

## 2017-05-15 NOTE — Patient Instructions (Addendum)
You will be set up for an upper endoscopy for early satiety, weight loss (WL 2-3 weeks from now).   Normal BMI (Body Mass Index- based on height and weight) is between 19 and 25. Your BMI today is Body mass index is 17.81 kg/m. Marland Kitchen Please consider follow up  regarding your BMI with your Primary Care Provider.

## 2017-05-17 ENCOUNTER — Encounter (HOSPITAL_COMMUNITY): Payer: Self-pay

## 2017-05-17 ENCOUNTER — Other Ambulatory Visit: Payer: Self-pay

## 2017-05-17 NOTE — Progress Notes (Signed)
Interpreter ID # [id number] used for history and instructions for EGD 05/24/17

## 2017-05-19 NOTE — Progress Notes (Signed)
    Postoperative Visit   History of Present Illness   Bobby Caldwell is a 61 y.o. year old male who presents for postoperative follow-up for: L-to-R fem-fem BPG (05/08/17).  The patient's wounds are healed.  The patient notes resolution of lower extremity symptoms.  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  This was found on EGD yesterday.  His current sx are related to his stomach: poor tolerance of food and repeated emesis.  The patient is not able to complete their activities of daily living.     For VQI Use Only   PRE-ADM LIVING: Home  AMB STATUS: Ambulatory   Physical Examination   Vitals:   05/25/17 0915  BP: 131/85  Pulse: 98  Resp: 16  Temp: (!) 96.9 F (36.1 C)  TempSrc: Oral  SpO2: 98%  Weight: 106 lb (48.1 kg)  Height: 5\' 6"  (1.676 m)    BLE: B groin incisions are healed, R DP faintly palpable, L DP easily palpable  Non-invasive Vascular Imaging   ABI (05/22/17)  R:   ABI: 1.14 (0.46),   PT: bi  DP: bi  TBI:  0.33  L:   ABI: 1.08 (0.53),   PT: bi  DP: bi  TBI: 0.35   Medical Decision Making   Bobby Caldwell is a 61 y.o. year old male who presents s/p L to R fem-fem BPG, s/p L CIA PTA+S, gastric neoplasm, severe COPD  After stenting L CIA and femorofemoral bypass, this patient's blood flow to his feet is essential normal. 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  I suspect most of his current sx are related to such and his severe COPD. I discussed in depth with the patient the nature of atherosclerosis, and emphasized the importance of maximal medical management including strict control of blood pressure, blood glucose, and lipid levels, obtaining regular exercise, and cessation of smoking.  The patient is aware that without maximal medical management the underlying atherosclerotic disease process will progress, limiting the benefit of any interventions. The patient's surveillance  will included ABI and bypass duplex studies which will be completed in: 3 months, at which time the patient will be re-evaluated.   I emphasized the importance of routine surveillance of the patient's bypass, as the vascular surgery literature emphasize the improved patency possible with assisted primary patency procedures versus secondary patency procedures. The patient agrees to participate in their maximal medical care and routine surveillance. Thank you for allowing [place] to participate in this patient's care.   Adele Barthel, MD, FACS [name] Office: [phone] Pager: [phone]

## 2017-05-22 ENCOUNTER — Ambulatory Visit (HOSPITAL_COMMUNITY)
Admission: RE | Admit: 2017-05-22 | Discharge: 2017-05-22 | Disposition: A | Discharge status: Home / Self Care | Payer: BLUE CROSS/BLUE SHIELD | Source: Ambulatory Visit | Attending: Vascular Surgery | Admitting: Vascular Surgery

## 2017-05-22 DIAGNOSIS — I739 Peripheral vascular disease, unspecified: Secondary | ICD-10-CM | POA: Insufficient documentation

## 2017-05-22 DIAGNOSIS — Z48812 Encounter for surgical aftercare following surgery on the circulatory system: Secondary | ICD-10-CM | POA: Diagnosis not present

## 2017-05-24 ENCOUNTER — Ambulatory Visit (HOSPITAL_COMMUNITY)
Admission: RE | Admit: 2017-05-24 | Discharge: 2017-05-24 | Disposition: A | Discharge status: Home / Self Care | Payer: BLUE CROSS/BLUE SHIELD | Source: Ambulatory Visit | Attending: Gastroenterology | Admitting: Gastroenterology

## 2017-05-24 ENCOUNTER — Encounter (HOSPITAL_COMMUNITY)
Admission: RE | Disposition: A | Discharge status: Home / Self Care | Payer: Self-pay | Source: Ambulatory Visit | Attending: Gastroenterology

## 2017-05-24 ENCOUNTER — Ambulatory Visit (HOSPITAL_COMMUNITY): Payer: BLUE CROSS/BLUE SHIELD | Admitting: Anesthesiology

## 2017-05-24 ENCOUNTER — Encounter (HOSPITAL_COMMUNITY): Payer: Self-pay

## 2017-05-24 ENCOUNTER — Other Ambulatory Visit: Payer: Self-pay

## 2017-05-24 DIAGNOSIS — R6881 Early satiety: Secondary | ICD-10-CM | POA: Insufficient documentation

## 2017-05-24 DIAGNOSIS — K2950 Unspecified chronic gastritis without bleeding: Secondary | ICD-10-CM | POA: Diagnosis not present

## 2017-05-24 DIAGNOSIS — Z79899 Other long term (current) drug therapy: Secondary | ICD-10-CM | POA: Insufficient documentation

## 2017-05-24 DIAGNOSIS — J439 Emphysema, unspecified: Secondary | ICD-10-CM | POA: Insufficient documentation

## 2017-05-24 DIAGNOSIS — K5909 Other constipation: Secondary | ICD-10-CM | POA: Diagnosis not present

## 2017-05-24 DIAGNOSIS — Z7902 Long term (current) use of antithrombotics/antiplatelets: Secondary | ICD-10-CM | POA: Diagnosis not present

## 2017-05-24 DIAGNOSIS — I739 Peripheral vascular disease, unspecified: Secondary | ICD-10-CM | POA: Insufficient documentation

## 2017-05-24 DIAGNOSIS — I700 Atherosclerosis of aorta: Secondary | ICD-10-CM | POA: Diagnosis not present

## 2017-05-24 DIAGNOSIS — R630 Anorexia: Secondary | ICD-10-CM | POA: Diagnosis not present

## 2017-05-24 DIAGNOSIS — K3189 Other diseases of stomach and duodenum: Secondary | ICD-10-CM | POA: Insufficient documentation

## 2017-05-24 DIAGNOSIS — F17210 Nicotine dependence, cigarettes, uncomplicated: Secondary | ICD-10-CM | POA: Insufficient documentation

## 2017-05-24 DIAGNOSIS — R634 Abnormal weight loss: Secondary | ICD-10-CM | POA: Insufficient documentation

## 2017-05-24 DIAGNOSIS — D490 Neoplasm of unspecified behavior of digestive system: Secondary | ICD-10-CM | POA: Diagnosis not present

## 2017-05-24 DIAGNOSIS — K2990 Gastroduodenitis, unspecified, without bleeding: Secondary | ICD-10-CM

## 2017-05-24 DIAGNOSIS — B9681 Helicobacter pylori [H. pylori] as the cause of diseases classified elsewhere: Secondary | ICD-10-CM | POA: Insufficient documentation

## 2017-05-24 DIAGNOSIS — K2970 Gastritis, unspecified, without bleeding: Secondary | ICD-10-CM

## 2017-05-24 HISTORY — DX: Abnormal weight loss: R63.4

## 2017-05-24 HISTORY — PX: ESOPHAGOGASTRODUODENOSCOPY (EGD) WITH PROPOFOL: SHX5813

## 2017-05-24 SURGERY — ESOPHAGOGASTRODUODENOSCOPY (EGD) WITH PROPOFOL
Anesthesia: Monitor Anesthesia Care

## 2017-05-24 MED ORDER — LIDOCAINE 2% (20 MG/ML) 5 ML SYRINGE
INTRAMUSCULAR | Status: AC
  Filled 2017-05-24: qty 5

## 2017-05-24 MED ORDER — PROPOFOL 10 MG/ML IV BOLUS
INTRAVENOUS | Status: DC | PRN
  Administered 2017-05-24: 10 mg via INTRAVENOUS
  Administered 2017-05-24: 20 mg via INTRAVENOUS

## 2017-05-24 MED ORDER — SODIUM CHLORIDE 0.9 % IV SOLN: INTRAVENOUS | Status: DC

## 2017-05-24 MED ORDER — LACTATED RINGERS IV SOLN
INTRAVENOUS | Status: DC
  Administered 2017-05-24: 10:00:00 via INTRAVENOUS

## 2017-05-24 MED ORDER — LIDOCAINE 2% (20 MG/ML) 5 ML SYRINGE
INTRAMUSCULAR | Status: DC | PRN
  Administered 2017-05-24: 60 mg via INTRAVENOUS

## 2017-05-24 MED ORDER — PROPOFOL 500 MG/50ML IV EMUL
INTRAVENOUS | Status: DC | PRN
  Administered 2017-05-24: 125 ug/kg/min via INTRAVENOUS

## 2017-05-24 MED ORDER — PROPOFOL 10 MG/ML IV BOLUS
INTRAVENOUS | Status: AC
  Filled 2017-05-24: qty 60

## 2017-05-24 SURGICAL SUPPLY — 14 items

## 2017-05-24 NOTE — Progress Notes (Signed)
Interpreter Bernette Mayers , ID#:  [phone] was used to assist with communication between RN and Doctors.    Laverta Baltimore, RN

## 2017-05-24 NOTE — Anesthesia Procedure Notes (Signed)
Procedure Name: MAC Date/Time: 05/24/2017 10:18 AM Performed by: Dione Booze, CRNA Pre-anesthesia Checklist: Patient identified, Emergency Drugs available, Suction available and Patient being monitored Patient Re-evaluated:Patient Re-evaluated prior to induction Oxygen Delivery Method: Non-rebreather mask Placement Confirmation: positive ETCO2

## 2017-05-24 NOTE — Discharge Instructions (Signed)

## 2017-05-24 NOTE — Transfer of Care (Signed)
Immediate Anesthesia Transfer of Care Note  Patient: Bobby Caldwell  Procedure(s) Performed: ESOPHAGOGASTRODUODENOSCOPY (EGD) WITH PROPOFOL (N/A )  Patient Location: PACU and Endoscopy Unit  Anesthesia Type:MAC  Level of Consciousness: awake and patient cooperative  Airway & Oxygen Therapy: Patient Spontanous Breathing and Patient connected to nasal cannula oxygen  Post-op Assessment: Report given to RN and Post -op Vital signs reviewed and stable  Post vital signs: Reviewed and stable  Last Vitals:  Vitals:   05/24/17 0930  BP: (!) 137/91  Pulse: 90  Resp: 18  Temp: 36.4 C  SpO2: 99%    Last Pain:  Vitals:   05/24/17 0930  TempSrc: Oral         Complications: No apparent anesthesia complications

## 2017-05-24 NOTE — Anesthesia Preprocedure Evaluation (Addendum)
Anesthesia Evaluation  Patient identified by MRN, date of birth, ID band Patient awake    Reviewed: Allergy & Precautions, NPO status , Patient's Chart, lab work & pertinent test results  Airway Mallampati: II  TM Distance: >3 FB Neck ROM: Full    Dental  (+) Caps,    Pulmonary shortness of breath, with exertion, at rest and lying, COPD,  COPD inhaler, Current Smoker,    Pulmonary exam normal breath sounds clear to auscultation + decreased breath sounds      Cardiovascular + Peripheral Vascular Disease  Normal cardiovascular exam Rhythm:Regular Rate:Normal     Neuro/Psych negative neurological ROS  negative psych ROS   GI/Hepatic Neg liver ROS, Weight loss   Endo/Other  negative endocrine ROS  Renal/GU negative Renal ROS  negative genitourinary   Musculoskeletal negative musculoskeletal ROS (+)   Abdominal   Peds  Hematology negative hematology ROS (+)   Anesthesia Other Findings   Reproductive/Obstetrics                            Anesthesia Physical Anesthesia Plan  ASA: III  Anesthesia Plan: MAC   Post-op Pain Management:    Induction: Intravenous  PONV Risk Score and Plan: 2  Airway Management Planned: Natural Airway and Nasal Cannula  Additional Equipment:   Intra-op Plan:   Post-operative Plan:   Informed Consent: I have reviewed the patients History and Physical, chart, labs and discussed the procedure including the risks, benefits and alternatives for the proposed anesthesia with the patient or authorized representative who has indicated his/her understanding and acceptance.   Dental advisory given  Plan Discussed with: CRNA, Anesthesiologist and Surgeon  Anesthesia Plan Comments: (Patient speaks [place]. Interpreter used throughout [employer] process. Risks/Benefits & alternatives to anesthesia discussed with patient.)       Anesthesia Quick Evaluation

## 2017-05-24 NOTE — Anesthesia Postprocedure Evaluation (Signed)
Anesthesia Post Note  Patient: Bobby Caldwell  Procedure(s) Performed: ESOPHAGOGASTRODUODENOSCOPY (EGD) WITH PROPOFOL (N/A )     Patient location during evaluation: PACU Anesthesia Type: MAC Level of consciousness: awake and alert Pain management: pain level controlled Vital Signs Assessment: post-procedure vital signs reviewed and stable Respiratory status: spontaneous breathing, nonlabored ventilation and respiratory function stable Cardiovascular status: stable and blood pressure returned to baseline Postop Assessment: no apparent nausea or vomiting Anesthetic complications: no    Last Vitals:  Vitals:   05/24/17 1100 05/24/17 1110  BP: 122/83 (!) 124/99  Pulse: 80 77  Resp: 17 18  Temp:    SpO2: 100% 100%    Last Pain:  Vitals:   05/24/17 1045  TempSrc: Oral                 Kullen Tomasetti A.

## 2017-05-24 NOTE — Op Note (Signed)
[facility] Patient Name: Bobby Caldwell Procedure Date: 05/24/2017 MRN: [id number] Attending MD: [name] , MD Date of Birth: [birth year] CSN: [id number] Age: 61 Admit Type: Outpatient Procedure:                Upper GI endoscopy Indications:              Early satiety, Weight loss Providers:                [name], MD, Cleda Daub, RN, Corliss Parish, Technician Referring MD:              Medicines:                Monitored Anesthesia Care Complications:            No immediate complications. Estimated blood loss:                            None. Estimated Blood Loss:     Estimated blood loss: none. Procedure:                Pre-Anesthesia Assessment:                           - Prior to the procedure, a History and Physical                            was performed, and patient medications and                            allergies were reviewed. The patient's tolerance of                            previous anesthesia was also reviewed. The risks                            and benefits of the procedure and the sedation                            options and risks were discussed with the patient.                            All questions were answered, and informed consent                            was obtained. Prior Anticoagulants: The patient has                            taken Plavix (clopidogrel), last dose was day of                            procedure. ASA Grade Assessment: IV - A patient  with severe systemic disease that is a constant                            threat to life. After reviewing the risks and                            benefits, the patient was deemed in satisfactory                            condition to undergo the procedure.                           After obtaining informed consent, the endoscope was                            passed under direct vision. Throughout the                             procedure, the patient's blood pressure, pulse, and                            oxygen saturations were monitored continuously. The                            EG-[id number] ([id number]) scope was introduced through the                            mouth, and advanced to the second part of duodenum.                            The upper GI endoscopy was accomplished without                            difficulty. The patient tolerated the procedure                            well. Scope In: Scope Out: Findings:      The esophagus was normal.      The stomach was abnormal: there was mass-like region in the distal       stomach that was ulcerated, heaped up and laterally spreading but not       circumferential. The mass measures about 4cm across and is proximal to       the pylorus by 3-4cm. This was sampled extensively and labeled distal       gastric mass.      The mucosa throughout the remaining stomach was also irregular appearing       (mild to moderate gastritis). This was most apparent about mid body of       the stomach where there was a stellate apearance of the mucosa. This was       extensively biopsied as well.      The examined duodenum was normal. Impression:               - Ulcerated mass like region in the distal stomach                            (  4cm across), extensively biopsied. This appears                            neoplastic.                           - Abnormal mucosa throughout stomach otherwise                            (mild-moderate gastritis), also extensively                            biopsied. Moderate Sedation:      N/A- Per Anesthesia Care Recommendation:           - Patient has a contact number available for                            emergencies. The signs and symptoms of potential                            delayed complications were discussed with the                            patient. Return to normal activities tomorrow.                             Written discharge instructions were provided to the                            patient.                           - Resume previous diet.                           - Continue present medications.                           - Await pathology results. Procedure Code(s):        --- Professional ---                           [phone], Esophagogastroduodenoscopy, flexible,                            transoral; with biopsy, single or multiple Diagnosis Code(s):        --- Professional ---                           D49.0, Neoplasm of unspecified behavior of                            digestive system                           R68.81, Early satiety  R63.4, Abnormal weight loss CPT copyright 2016 American Medical Association. All rights reserved. The codes documented in this report are preliminary and upon coder review may  be revised to meet current compliance requirements. [name], MD 05/24/2017 10:47:27 AM This report has been signed electronically. Number of Addenda: 0

## 2017-05-24 NOTE — Interval H&P Note (Signed)
History and Physical Interval Note:  05/24/2017 10:07 AM  Bobby Caldwell Cure  has presented today for surgery, with the diagnosis of weight loss  The various methods of treatment have been discussed with the patient and family. After consideration of risks, benefits and other options for treatment, the patient has consented to  Procedure(s): ESOPHAGOGASTRODUODENOSCOPY (EGD) WITH PROPOFOL (N/A) as a surgical intervention .  The patient's history has been reviewed, patient examined, no change in status, stable for surgery.  I have reviewed the patient's chart and labs.  Questions were answered to the patient's satisfaction.     [name]

## 2017-05-25 ENCOUNTER — Ambulatory Visit (INDEPENDENT_AMBULATORY_CARE_PROVIDER_SITE_OTHER): Payer: BLUE CROSS/BLUE SHIELD | Admitting: Vascular Surgery

## 2017-05-25 ENCOUNTER — Encounter: Payer: Self-pay | Admitting: Vascular Surgery

## 2017-05-25 VITALS — BP 131/85 | HR 98 | Temp 96.9°F | Resp 16 | Ht 66.0 in | Wt 106.0 lb

## 2017-05-25 DIAGNOSIS — I998 Other disorder of circulatory system: Secondary | ICD-10-CM

## 2017-05-25 DIAGNOSIS — I70229 Atherosclerosis of native arteries of extremities with rest pain, unspecified extremity: Secondary | ICD-10-CM

## 2017-05-25 DIAGNOSIS — Z0279 Encounter for issue of other medical certificate: Secondary | ICD-10-CM

## 2017-05-29 ENCOUNTER — Encounter: Payer: Self-pay | Admitting: Hematology

## 2017-05-29 ENCOUNTER — Other Ambulatory Visit: Payer: Self-pay

## 2017-05-29 ENCOUNTER — Telehealth: Payer: Self-pay | Admitting: Hematology

## 2017-05-29 DIAGNOSIS — K3189 Other diseases of stomach and duodenum: Secondary | ICD-10-CM

## 2017-05-29 DIAGNOSIS — C169 Malignant neoplasm of stomach, unspecified: Secondary | ICD-10-CM

## 2017-05-29 NOTE — Telephone Encounter (Signed)
Appt has been scheduled for the pt to see Dr. Burr Medico on 11/27 at 230pm. Pt doesn't speak much English. I will mail him a letter and notify the referring office.

## 2017-06-04 DIAGNOSIS — C169 Malignant neoplasm of stomach, unspecified: Secondary | ICD-10-CM | POA: Insufficient documentation

## 2017-06-04 NOTE — Addendum Note (Signed)
Addended by: Lianne Cure A on: 06/04/2017 02:29 PM   Modules accepted: Orders

## 2017-06-05 ENCOUNTER — Encounter: Payer: Self-pay | Admitting: Hematology

## 2017-06-05 ENCOUNTER — Telehealth: Payer: Self-pay | Admitting: Hematology

## 2017-06-05 ENCOUNTER — Ambulatory Visit (HOSPITAL_BASED_OUTPATIENT_CLINIC_OR_DEPARTMENT_OTHER): Payer: BLUE CROSS/BLUE SHIELD | Admitting: Hematology

## 2017-06-05 DIAGNOSIS — C162 Malignant neoplasm of body of stomach: Secondary | ICD-10-CM

## 2017-06-05 DIAGNOSIS — R0789 Other chest pain: Secondary | ICD-10-CM

## 2017-06-05 DIAGNOSIS — J449 Chronic obstructive pulmonary disease, unspecified: Secondary | ICD-10-CM

## 2017-06-05 DIAGNOSIS — R634 Abnormal weight loss: Secondary | ICD-10-CM | POA: Diagnosis not present

## 2017-06-05 DIAGNOSIS — Z720 Tobacco use: Secondary | ICD-10-CM | POA: Diagnosis not present

## 2017-06-05 DIAGNOSIS — R110 Nausea: Secondary | ICD-10-CM

## 2017-06-05 DIAGNOSIS — R630 Anorexia: Secondary | ICD-10-CM

## 2017-06-05 DIAGNOSIS — C169 Malignant neoplasm of stomach, unspecified: Secondary | ICD-10-CM

## 2017-06-05 MED ORDER — PROCHLORPERAZINE MALEATE 10 MG PO TABS: 10.0000 mg | ORAL_TABLET | Freq: Four times a day (QID) | ORAL | 0 refills | Status: DC | PRN

## 2017-06-05 NOTE — Progress Notes (Signed)
[facility]  [phone] [phone]  Clinic New Consult Note   Patient Care Team: Patient, No Pcp Per as PCP - General (General Practice) [name], MD as Consulting Physician (Pulmonary Disease) 06/05/2017  REFERRAL PHYSICIAN: Dr. [name]   CHIEF COMPLAINTS/PURPOSE OF CONSULTATION:  Gastric Cancer, Adenocarcinoma     Gastric cancer ([place])   05/24/2017 Procedure    EGD by Dr. [name] 05/24/17 IMPRESSION Ulcerated mass like region in the distal stomach (4cm across), extensively biopsied. This appears neoplastic. - Abnormal mucosa throughout stomach otherwise (mild-moderate gastritis), also extensively biopsied.      05/24/2017 Initial Biopsy    Diagnosis 05/24/17  1. Stomach, biopsy, mass distal - ADENOCARCINOMA, SEE COMMENT. - CHRONIC ACTIVE GASTRITIS WITH INTESTINAL METAPLASIA. 2. 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 - NO DYSPLASIA OR MALIGNANCY      06/04/2017 Initial Diagnosis    Gastric cancer ([place])        HISTORY OF PRESENTING ILLNESS: 06/05/17  Bobby Caldwell 61 y.o. male is here because of newly diagnosed Gastric Cancer. He was referred by Dr. [name]. He presents to the clinic today accompanied by an albanian interpretor and then helped by video interpretor [phone] to help translate his visit.   In the past he was diagnosed with COPD controlled with inhalers. He has smoked for several years and is down to smoking 10 cigars a day. He is actively trying to quit.   Today he notes he is upset about his diagnosis and he recently had bypass surgery on his bilateral legs in 04/2017 and feels fatigued from this. He met with Dr. [name] today and he was told he will need chemo and surgery. He is willing to go with whatever is recommended.  He notes this started with issues with both legs feeling weak and shaking. This occurred for 2-3 months. He had  bypass surgery with Dr. Bridgett Larsson. He does not walk much so he has not tested how well he can walk much. The 03/2017 CT scan before his surgery showed thickening in his stomach which is why he was referred to Dr. [name]. Patient notes his stomach issues has been ongoing, but recently has burning sensation below umbilical area since endoscopy. This was only mildly presents prior to procedure. His appetite has not changed much. He doesn't dare to eat much as he is afraid to vomit. He has felt this way for th past 6 months. He currently loss 14 pounds in the past 6 months. He does not go out much anymore, he mostly stays in his house. He is able to do ALDs himself, his wife helps out around the house and cares for him. He has 4 children, 37-21yo. His wife does not work. He does not find himself feeling up to working anymore, he has no energy to do anything.  He notes pain in his chest from his COPD. He uses inhalers 3 times a day. He would like to do the surgery alone but if chemo is needed he is fine with proceeding with treatment. He reports his body shakes when he gets blood drawn. He is willing to try oral iron in the meantime. He would like to be seen by social worker to help him our with finances as he is not able to work. He feels he often has to break due to sever fatigue and weakness.    MEDICAL HISTORY:  Past Medical History:  Diagnosis Date  . COPD (  chronic obstructive pulmonary disease) ([place])   . Dyspnea   . PAD (peripheral artery disease) ([place])   . Stomach cancer ([place])   . Tobacco abuse   . Weight loss     SURGICAL HISTORY: Past Surgical History:  Procedure Laterality Date  . ABDOMINAL AORTOGRAM W/LOWER EXTREMITY N/A 05/03/2017   Procedure: ABDOMINAL AORTOGRAM W/LOWER EXTREMITY;  Surgeon: Conrad Belfield, MD;  Location: [facility];  Service: Cardiovascular;  Laterality: N/A;  . ESOPHAGOGASTRODUODENOSCOPY     05/24/17  . ESOPHAGOGASTRODUODENOSCOPY (EGD) WITH PROPOFOL N/A 05/24/2017    Procedure: ESOPHAGOGASTRODUODENOSCOPY (EGD) WITH PROPOFOL;  Surgeon: [name], MD;  Location: [facility];  Service: Endoscopy;  Laterality: N/A;  . FEMORAL-FEMORAL BYPASS GRAFT Bilateral 05/08/2017   Procedure: LEFT TO RIGHT BYPASS GRAFT FEMORAL-FEMORAL ARTERY;  Surgeon: Conrad Haddonfield, MD;  Location: [facility];  Service: Vascular;  Laterality: Bilateral;  . PERIPHERAL VASCULAR INTERVENTION Left 05/03/2017   Procedure: PERIPHERAL VASCULAR INTERVENTION;  Surgeon: Conrad Ozark, MD;  Location: [facility];  Service: Cardiovascular;  Laterality: Left;  common iliac    SOCIAL HISTORY: Social History   Socioeconomic History  . Marital status: Married    Spouse name: Not on file  . Number of children: 4  . Years of education: Not on file  . Highest education level: Not on file  Social Needs  . Financial resource strain: Not on file  . Food insecurity - worry: Not on file  . Food insecurity - inability: Not on file  . Transportation needs - medical: Not on file  . Transportation needs - non-medical: Not on file  Occupational History  . Not on file  Tobacco Use  . Smoking status: Current Every Day Smoker    Packs/day: 1.00    Years: 50.00    Pack years: 50.00    Types: Cigarettes  . Smokeless tobacco: Never Used  . Tobacco comment: 10 cigarettes per day  Substance and Sexual Activity  . Alcohol use: No    Alcohol/week: 0.0 oz  . Drug use: No  . Sexual activity: Yes  Other Topics Concern  . Not on file  Social History Narrative  . Not on file    FAMILY HISTORY: Family History  Family history unknown: Yes    ALLERGIES:  has No Known Allergies.  MEDICATIONS:  Current Outpatient Medications  Medication Sig Dispense Refill  . Albuterol Sulfate (PROAIR RESPICLICK) 570 (90 Base) MCG/ACT AEPB Inhale 2 puffs into the lungs every 6 (six) hours as needed. 1 each 6  . atorvastatin (LIPITOR) 10 MG tablet Take 1 tablet (10 mg total) by mouth daily. 30 tablet 2  .  budesonide-formoterol (SYMBICORT) 160-4.5 MCG/ACT inhaler Inhale 2 puffs into the lungs 2 (two) times daily. 1 Inhaler 6  . clopidogrel (PLAVIX) 75 MG tablet Take 1 tablet (75 mg total) by mouth daily. 30 tablet 11  . oxyCODONE-acetaminophen (ROXICET) 5-325 MG tablet Take 1 tablet by mouth every 6 (six) hours as needed for severe pain. 20 tablet 0  . Tiotropium Bromide Monohydrate (SPIRIVA RESPIMAT) 2.5 MCG/ACT AERS Inhale 2 puffs into the lungs daily. 1 Inhaler 6  . prochlorperazine (COMPAZINE) 10 MG tablet Take 1 tablet (10 mg total) by mouth every 6 (six) hours as needed for nausea or vomiting. 30 tablet 0   No current facility-administered medications for this visit.     REVIEW OF SYSTEMS:   Constitutional: Denies fevers, chills or abnormal night sweats (+) low appetite/anorexia (+) weight loss (+) low  energy/significant fatigue  Eyes: Denies blurriness of vision, double vision or watery eyes Ears, nose, mouth, throat, and face: Denies mucositis or sore throat Respiratory: (+) chest pain from COPD Cardiovascular: Denies palpitation or lower extremity swelling   Gastrointestinal:  Denies nausea, heartburn or change in bowel habits (+) lower abdominal pain  Skin: Denies abnormal skin rashes Lymphatics: Denies new lymphadenopathy or easy bruising Neurological:Denies numbness, tingling or new weaknesses MSK: (+) bilateral leg weakness/slight pain  Behavioral/Psych: Mood is stable, no new changes  All other systems were reviewed with the patient and are negative.   PHYSICAL EXAMINATION:  ECOG PERFORMANCE STATUS: 2 - Symptomatic, <50% confined to bed  Vitals:   06/05/17 1432  BP: (!) 147/83  Pulse: 92  Resp: 20  Temp: 97.6 F (36.4 C)  SpO2: 96%   Filed Weights   06/05/17 1432  Weight: 113 lb 6.4 oz (51.4 kg)    GENERAL:alert, no distress and comfortable SKIN: skin color, texture, turgor are normal, no rashes or significant lesions EYES: normal, conjunctiva are pink and  non-injected, sclera clear OROPHARYNX:no exudate, no erythema and lips, buccal mucosa, and tongue normal  NECK: supple, thyroid normal size, non-tender, without nodularity LYMPH:  no palpable lymphadenopathy in the cervical, axillary or inguinal LUNGS: clear to auscultation and percussion with normal breathing effort (+) decreased breast sounds bilaterally.   HEART: regular rate & rhythm and no murmurs and no lower extremity edema ABDOMEN:abdomen soft, non-tender and normal bowel sounds  Musculoskeletal:no cyanosis of digits and no clubbing  PSYCH: alert & oriented x 3 with fluent speech NEURO: no focal motor/sensory deficits  LABORATORY DATA:  I have reviewed the data as listed CBC Latest Ref Rng & Units 05/09/2017 05/08/2017 05/04/2017  WBC 4.0 - 10.5 K/uL 14.5(H) 8.1 9.6  Hemoglobin 13.0 - 17.0 g/dL 11.9(L) 13.4 13.6  Hematocrit 39.0 - 52.0 % 35.1(L) 39.4 40.4  Platelets 150 - 400 K/uL 255 212 179     CMP Latest Ref Rng & Units 05/09/2017 05/08/2017 05/08/2017  Glucose 65 - 99 mg/dL 130(H) - 91  BUN 6 - 20 mg/dL 9 - 6  Creatinine 0.61 - 1.24 mg/dL 0.50(L) 0.56(L) 0.56(L)  Sodium 135 - 145 mmol/L 136 - 139  Potassium 3.5 - 5.1 mmol/L 3.9 - 3.3(L)  Chloride 101 - 111 mmol/L 102 - 103  CO2 22 - 32 mmol/L 28 - 28  Calcium 8.9 - 10.3 mg/dL 8.2(L) - 8.6(L)  Total Protein 6.5 - 8.1 g/dL - - 6.1(L)  Total Bilirubin 0.3 - 1.2 mg/dL - - 0.8  Alkaline Phos 38 - 126 U/L - - 88  AST 15 - 41 U/L - - 16  ALT 17 - 63 U/L - - 18    PATHOLOGY  Diagnosis 05/24/17  1. Stomach, biopsy, mass distal - ADENOCARCINOMA, SEE COMMENT. - CHRONIC ACTIVE GASTRITIS WITH INTESTINAL METAPLASIA. 2. 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 - NO DYSPLASIA OR MALIGNANCY. Microscopic Comment 1. The fragments are superficial and therefore assessment of invasion is hampered. Dr. Saralyn Pilar has reviewed the case. The case was  called to Dr. [name] on 05/25/2017. 2. A Warthin-Starry stain is performed to determine the possibility of the presence of Helicobacter pylori. The Warthin-Starry stain is negative for organisms of Helicobacter pylori   PROCEDURES  EGD by Dr. [name] 05/24/17 IMPRESSION Ulcerated mass like region in the distal stomach (4cm across), extensively biopsied. This appears neoplastic. - Abnormal mucosa throughout stomach otherwise (mild-moderate gastritis), also extensively  biopsied.   RADIOGRAPHIC STUDIES: I have personally reviewed the radiological images as listed and agreed with the findings in the report. Dg Chest 2 View  Result Date: 05/08/2017 CLINICAL DATA:  Increasing shortness of breath. Preoperative evaluation. History of COPD. EXAM: CHEST  2 VIEW COMPARISON:  CT chest April 02, 2017 FINDINGS: Cardiomediastinal silhouette is normal. No pleural effusions or focal consolidations. Increased lung volumes with flattened hemidiaphragms and chronic interstitial changes. Trachea projects midline and there is no pneumothorax. Soft tissue planes and included osseous structures are non-suspicious. IMPRESSION: COPD.  No acute cardiopulmonary process. Electronically Signed   By: Elon Alas M.D.   On: 05/08/2017 06:39    CT CAP 04/02/17 IMPRESSION: 1. Aortic atherosclerosis with fusiform ectasia of the infrarenal abdominal aorta which measures up to 2.6 x 2.3 cm, and contains a large burden of mural thrombus. 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 2. Today's study was not a CTA examination, which limits assessment of vascular abnormalities. Additionally, secondary to extensive patient respiratory motion, accurate assessment of small vessels is limited. With these limitations in mind, there is potential stenosis of both the origin of the celiac axis and the origin of the inferior mesenteric  artery. Given the patient's history of intermittent abdominal pain, further evaluation with CTA of the abdomen and pelvis should be considered if clinically appropriate, and if the patient can be counseled to adequately hold his breath during the examination to allow for optimal image quality. 3. No other acute findings are noted in the chest, abdomen or pelvis to account for the patient's symptoms. 4. Diffuse bronchial wall thickening with moderate centrilobular and paraseptal emphysema; imaging findings suggestive of underlying COPD. 5. Multiple small pulmonary nodules measuring up to 6 mm, stable compared to prior examinations dating back take 02/22/2015, considered benign. No future follow-up needed. This recommendation follows the consensus statement: Guidelines for Management of Incidental Pulmonary Nodules Detected on CT Images: From the Fleischner Society 2017; Radiology 2017; 284:228-243. 6. Additional incidental findings, as above. Aortic Atherosclerosis (ICD10-I70.0) and Emphysema (ICD10-J43.9).   ASSESSMENT & PLAN:  Bobby Caldwell is a 61 y.o. [place] male with a history of heavy smoking and COPD, peripheral vascular disease, presents with fatigue, anorexia, early satiety and weight loss   1. Gastric Adenocarcinoma, [id number]  -I reviewed his EGD findings and biopsy pathology results that show gastric adenocarcinoma. He had CT chest, abdomen and pelvis with contrast for his peripheral vascular disease on 04/02/2017, which shows no sign of metastasis at that time.  -I will repeat staging scan to rule out new distant metastasis since last scan.  -I suggest a EUS for tumor staging. If T1 or T2 lesion, I will recommend upfront surgery, followed by adjuvant chemo if needed. If it's T3 or T4 lesion, I would recommend neoadjuvant chemo first. The periop chemo FLOT4 (5-fu, oxaliplatin, and docetaxel) has showed superior outcome for gastric cancer. However, due to his poor nutrition status,  comorbidities, and poor performance status, he may not be able to tolerate such intensive chemotherapy.  I will consider FOLFOX as the first cycle of neoadjuvant chemo, his nutrition status improved and he tolerates well, will consider change to FLOT4 for 2 more cycles before surgery  -Pt prefers to have surgery first, but is to consider neoadjuvant chemotherapy if we recommends.  - I will set up consult with our dietitian. He may benefit from J-tube placement and tube feeds to improve  his nutrition status  -05/09/17 labs show he is anemic. I will do lab at next visit to monitor his anemia and the need for any intervention. I currently suggest he take OTC multivitamin and iron pill.  -He will do a chemo class in 2 weeks and get his endoscopy and CT CAP within his  -Will schedule future appointments in the morning so he can come with his interpreter.  -f/u in 2 weeks    2. Anorexia, nausea and weight loss  -He has loss 14 pounds in the past 6 months  -I will set up nutrition consult -I called in compazine today for his nausea  -I will discuss J-tube placement with Dr. [name]    3. COPD, Smoking Cessation  -He reduced from 50 cigars a day to 10 cigars a day. He is actively trying to quit.  -I discussed his chest pain is due to his COPD and smoking.    4. Financial support -I will refer him to our social worker for help with financial help, he is no longer able to work and would like apply for disability.     PLAN:  -CT CAP with contrast in 1-2 weeks  -Lab, Chemo class and f/u in 2 weeks  -Nutrition consult and SW consult this week  -I discussed with Dr [name], he will try to do EUS this week  -probable Port placement, diagnostic lapascopy and J-tube placement by Dr. [name] next week    Orders Placed This Encounter  Procedures  . CT Abdomen Pelvis W Contrast    Standing Status:   Future    Standing Expiration Date:   06/05/2018    Order Specific Question:   If indicated for the  ordered procedure, I authorize the administration of contrast media per Radiology protocol    Answer:   Yes    Order Specific Question:   Preferred imaging location?    Answer:   [facility]    Order Specific Question:   Radiology Contrast Protocol - do NOT remove file path    Answer:   [email or web address]  . CT Chest W Contrast    Standing Status:   Future    Standing Expiration Date:   06/05/2018    Order Specific Question:   If indicated for the ordered procedure, I authorize the administration of contrast media per Radiology protocol    Answer:   Yes    Order Specific Question:   Preferred imaging location?    Answer:   [facility]    Order Specific Question:   Radiology Contrast Protocol - do NOT remove file path    Answer:   [email or web address]  . CBC with Differential    Standing Status:   Standing    Number of Occurrences:   100    Standing Expiration Date:   06/05/2022  . Comprehensive metabolic panel    Standing Status:   Standing    Number of Occurrences:   100    Standing Expiration Date:   06/05/2022  . Ferritin    Standing Status:   Future    Standing Expiration Date:   06/05/2018  . Iron and TIBC    Standing Status:   Future    Standing Expiration Date:   06/05/2018  . CEA    Standing Status:   Standing    Number of Occurrences:   100    Standing Expiration Date:   06/05/2022    All questions were answered. The patient knows to  call the clinic with any problems, questions or concerns. I spent 55 minutes counseling the patient face to face. The total time spent in the appointment was 60 minutes and more than 50% was on counseling.     [name], MD 06/05/2017    This document serves as a record of services personally performed by [name], MD. It was created on her behalf by Joslyn Devon, a trained medical scribe. The creation of this record is based on the scribe's personal  observations and the provider's statements to them.   I have reviewed the above documentation for accuracy and completeness, and I agree with the above.

## 2017-06-05 NOTE — H&P (View-Only) (Signed)
[facility]  [phone] [phone]  Clinic New Consult Note   Patient Care Team: Patient, No Pcp Per as PCP - General (General Practice) [name], MD as Consulting Physician (Pulmonary Disease) 06/05/2017  REFERRAL PHYSICIAN: Dr. [name]   CHIEF COMPLAINTS/PURPOSE OF CONSULTATION:  Gastric Cancer, Adenocarcinoma     Gastric cancer ([place])   05/24/2017 Procedure    EGD by Dr. [name] 05/24/17 IMPRESSION Ulcerated mass like region in the distal stomach (4cm across), extensively biopsied. This appears neoplastic. - Abnormal mucosa throughout stomach otherwise (mild-moderate gastritis), also extensively biopsied.      05/24/2017 Initial Biopsy    Diagnosis 05/24/17  1. Stomach, biopsy, mass distal - ADENOCARCINOMA, SEE COMMENT. - CHRONIC ACTIVE GASTRITIS WITH INTESTINAL METAPLASIA. 2. 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 - NO DYSPLASIA OR MALIGNANCY      06/04/2017 Initial Diagnosis    Gastric cancer ([place])        HISTORY OF PRESENTING ILLNESS: 06/05/17  Bobby Caldwell 61 y.o. male is here because of newly diagnosed Gastric Cancer. He was referred by Dr. [name]. He presents to the clinic today accompanied by an albanian interpretor and then helped by video interpretor [phone] to help translate his visit.   In the past he was diagnosed with COPD controlled with inhalers. He has smoked for several years and is down to smoking 10 cigars a day. He is actively trying to quit.   Today he notes he is upset about his diagnosis and he recently had bypass surgery on his bilateral legs in 04/2017 and feels fatigued from this. He met with Dr. [name] today and he was told he will need chemo and surgery. He is willing to go with whatever is recommended.  He notes this started with issues with both legs feeling weak and shaking. This occurred for 2-3 months. He had  bypass surgery with Dr. Bridgett Larsson. He does not walk much so he has not tested how well he can walk much. The 03/2017 CT scan before his surgery showed thickening in his stomach which is why he was referred to Dr. [name]. Patient notes his stomach issues has been ongoing, but recently has burning sensation below umbilical area since endoscopy. This was only mildly presents prior to procedure. His appetite has not changed much. He doesn't dare to eat much as he is afraid to vomit. He has felt this way for th past 6 months. He currently loss 14 pounds in the past 6 months. He does not go out much anymore, he mostly stays in his house. He is able to do ALDs himself, his wife helps out around the house and cares for him. He has 4 children, 37-21yo. His wife does not work. He does not find himself feeling up to working anymore, he has no energy to do anything.  He notes pain in his chest from his COPD. He uses inhalers 3 times a day. He would like to do the surgery alone but if chemo is needed he is fine with proceeding with treatment. He reports his body shakes when he gets blood drawn. He is willing to try oral iron in the meantime. He would like to be seen by social worker to help him our with finances as he is not able to work. He feels he often has to break due to sever fatigue and weakness.    MEDICAL HISTORY:  Past Medical History:  Diagnosis Date  . COPD (  chronic obstructive pulmonary disease) ([place])   . Dyspnea   . PAD (peripheral artery disease) ([place])   . Stomach cancer ([place])   . Tobacco abuse   . Weight loss     SURGICAL HISTORY: Past Surgical History:  Procedure Laterality Date  . ABDOMINAL AORTOGRAM W/LOWER EXTREMITY N/A 05/03/2017   Procedure: ABDOMINAL AORTOGRAM W/LOWER EXTREMITY;  Surgeon: Conrad Batavia, MD;  Location: [facility];  Service: Cardiovascular;  Laterality: N/A;  . ESOPHAGOGASTRODUODENOSCOPY     05/24/17  . ESOPHAGOGASTRODUODENOSCOPY (EGD) WITH PROPOFOL N/A 05/24/2017    Procedure: ESOPHAGOGASTRODUODENOSCOPY (EGD) WITH PROPOFOL;  Surgeon: [name], MD;  Location: [facility];  Service: Endoscopy;  Laterality: N/A;  . FEMORAL-FEMORAL BYPASS GRAFT Bilateral 05/08/2017   Procedure: LEFT TO RIGHT BYPASS GRAFT FEMORAL-FEMORAL ARTERY;  Surgeon: Conrad Calumet, MD;  Location: [facility];  Service: Vascular;  Laterality: Bilateral;  . PERIPHERAL VASCULAR INTERVENTION Left 05/03/2017   Procedure: PERIPHERAL VASCULAR INTERVENTION;  Surgeon: Conrad New Hope, MD;  Location: [facility];  Service: Cardiovascular;  Laterality: Left;  common iliac    SOCIAL HISTORY: Social History   Socioeconomic History  . Marital status: Married    Spouse name: Not on file  . Number of children: 4  . Years of education: Not on file  . Highest education level: Not on file  Social Needs  . Financial resource strain: Not on file  . Food insecurity - worry: Not on file  . Food insecurity - inability: Not on file  . Transportation needs - medical: Not on file  . Transportation needs - non-medical: Not on file  Occupational History  . Not on file  Tobacco Use  . Smoking status: Current Every Day Smoker    Packs/day: 1.00    Years: 50.00    Pack years: 50.00    Types: Cigarettes  . Smokeless tobacco: Never Used  . Tobacco comment: 10 cigarettes per day  Substance and Sexual Activity  . Alcohol use: No    Alcohol/week: 0.0 oz  . Drug use: No  . Sexual activity: Yes  Other Topics Concern  . Not on file  Social History Narrative  . Not on file    FAMILY HISTORY: Family History  Family history unknown: Yes    ALLERGIES:  has No Known Allergies.  MEDICATIONS:  Current Outpatient Medications  Medication Sig Dispense Refill  . Albuterol Sulfate (PROAIR RESPICLICK) 350 (90 Base) MCG/ACT AEPB Inhale 2 puffs into the lungs every 6 (six) hours as needed. 1 each 6  . atorvastatin (LIPITOR) 10 MG tablet Take 1 tablet (10 mg total) by mouth daily. 30 tablet 2  .  budesonide-formoterol (SYMBICORT) 160-4.5 MCG/ACT inhaler Inhale 2 puffs into the lungs 2 (two) times daily. 1 Inhaler 6  . clopidogrel (PLAVIX) 75 MG tablet Take 1 tablet (75 mg total) by mouth daily. 30 tablet 11  . oxyCODONE-acetaminophen (ROXICET) 5-325 MG tablet Take 1 tablet by mouth every 6 (six) hours as needed for severe pain. 20 tablet 0  . Tiotropium Bromide Monohydrate (SPIRIVA RESPIMAT) 2.5 MCG/ACT AERS Inhale 2 puffs into the lungs daily. 1 Inhaler 6  . prochlorperazine (COMPAZINE) 10 MG tablet Take 1 tablet (10 mg total) by mouth every 6 (six) hours as needed for nausea or vomiting. 30 tablet 0   No current facility-administered medications for this visit.     REVIEW OF SYSTEMS:   Constitutional: Denies fevers, chills or abnormal night sweats (+) low appetite/anorexia (+) weight loss (+) low  energy/significant fatigue  Eyes: Denies blurriness of vision, double vision or watery eyes Ears, nose, mouth, throat, and face: Denies mucositis or sore throat Respiratory: (+) chest pain from COPD Cardiovascular: Denies palpitation or lower extremity swelling   Gastrointestinal:  Denies nausea, heartburn or change in bowel habits (+) lower abdominal pain  Skin: Denies abnormal skin rashes Lymphatics: Denies new lymphadenopathy or easy bruising Neurological:Denies numbness, tingling or new weaknesses MSK: (+) bilateral leg weakness/slight pain  Behavioral/Psych: Mood is stable, no new changes  All other systems were reviewed with the patient and are negative.   PHYSICAL EXAMINATION:  ECOG PERFORMANCE STATUS: 2 - Symptomatic, <50% confined to bed  Vitals:   06/05/17 1432  BP: (!) 147/83  Pulse: 92  Resp: 20  Temp: 97.6 F (36.4 C)  SpO2: 96%   Filed Weights   06/05/17 1432  Weight: 113 lb 6.4 oz (51.4 kg)    GENERAL:alert, no distress and comfortable SKIN: skin color, texture, turgor are normal, no rashes or significant lesions EYES: normal, conjunctiva are pink and  non-injected, sclera clear OROPHARYNX:no exudate, no erythema and lips, buccal mucosa, and tongue normal  NECK: supple, thyroid normal size, non-tender, without nodularity LYMPH:  no palpable lymphadenopathy in the cervical, axillary or inguinal LUNGS: clear to auscultation and percussion with normal breathing effort (+) decreased breast sounds bilaterally.   HEART: regular rate & rhythm and no murmurs and no lower extremity edema ABDOMEN:abdomen soft, non-tender and normal bowel sounds  Musculoskeletal:no cyanosis of digits and no clubbing  PSYCH: alert & oriented x 3 with fluent speech NEURO: no focal motor/sensory deficits  LABORATORY DATA:  I have reviewed the data as listed CBC Latest Ref Rng & Units 05/09/2017 05/08/2017 05/04/2017  WBC 4.0 - 10.5 K/uL 14.5(H) 8.1 9.6  Hemoglobin 13.0 - 17.0 g/dL 11.9(L) 13.4 13.6  Hematocrit 39.0 - 52.0 % 35.1(L) 39.4 40.4  Platelets 150 - 400 K/uL 255 212 179     CMP Latest Ref Rng & Units 05/09/2017 05/08/2017 05/08/2017  Glucose 65 - 99 mg/dL 130(H) - 91  BUN 6 - 20 mg/dL 9 - 6  Creatinine 0.61 - 1.24 mg/dL 0.50(L) 0.56(L) 0.56(L)  Sodium 135 - 145 mmol/L 136 - 139  Potassium 3.5 - 5.1 mmol/L 3.9 - 3.3(L)  Chloride 101 - 111 mmol/L 102 - 103  CO2 22 - 32 mmol/L 28 - 28  Calcium 8.9 - 10.3 mg/dL 8.2(L) - 8.6(L)  Total Protein 6.5 - 8.1 g/dL - - 6.1(L)  Total Bilirubin 0.3 - 1.2 mg/dL - - 0.8  Alkaline Phos 38 - 126 U/L - - 88  AST 15 - 41 U/L - - 16  ALT 17 - 63 U/L - - 18    PATHOLOGY  Diagnosis 05/24/17  1. Stomach, biopsy, mass distal - ADENOCARCINOMA, SEE COMMENT. - CHRONIC ACTIVE GASTRITIS WITH INTESTINAL METAPLASIA. 2. 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 - NO DYSPLASIA OR MALIGNANCY. Microscopic Comment 1. The fragments are superficial and therefore assessment of invasion is hampered. Dr. Saralyn Pilar has reviewed the case. The case was  called to Dr. [name] on 05/25/2017. 2. A Warthin-Starry stain is performed to determine the possibility of the presence of Helicobacter pylori. The Warthin-Starry stain is negative for organisms of Helicobacter pylori   PROCEDURES  EGD by Dr. [name] 05/24/17 IMPRESSION Ulcerated mass like region in the distal stomach (4cm across), extensively biopsied. This appears neoplastic. - Abnormal mucosa throughout stomach otherwise (mild-moderate gastritis), also extensively  biopsied.   RADIOGRAPHIC STUDIES: I have personally reviewed the radiological images as listed and agreed with the findings in the report. Dg Chest 2 View  Result Date: 05/08/2017 CLINICAL DATA:  Increasing shortness of breath. Preoperative evaluation. History of COPD. EXAM: CHEST  2 VIEW COMPARISON:  CT chest April 02, 2017 FINDINGS: Cardiomediastinal silhouette is normal. No pleural effusions or focal consolidations. Increased lung volumes with flattened hemidiaphragms and chronic interstitial changes. Trachea projects midline and there is no pneumothorax. Soft tissue planes and included osseous structures are non-suspicious. IMPRESSION: COPD.  No acute cardiopulmonary process. Electronically Signed   By: Elon Alas M.D.   On: 05/08/2017 06:39    CT CAP 04/02/17 IMPRESSION: 1. Aortic atherosclerosis with fusiform ectasia of the infrarenal abdominal aorta which measures up to 2.6 x 2.3 cm, and contains a large burden of mural thrombus. 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 2. Today's study was not a CTA examination, which limits assessment of vascular abnormalities. Additionally, secondary to extensive patient respiratory motion, accurate assessment of small vessels is limited. With these limitations in mind, there is potential stenosis of both the origin of the celiac axis and the origin of the inferior mesenteric  artery. Given the patient's history of intermittent abdominal pain, further evaluation with CTA of the abdomen and pelvis should be considered if clinically appropriate, and if the patient can be counseled to adequately hold his breath during the examination to allow for optimal image quality. 3. No other acute findings are noted in the chest, abdomen or pelvis to account for the patient's symptoms. 4. Diffuse bronchial wall thickening with moderate centrilobular and paraseptal emphysema; imaging findings suggestive of underlying COPD. 5. Multiple small pulmonary nodules measuring up to 6 mm, stable compared to prior examinations dating back take 02/22/2015, considered benign. No future follow-up needed. This recommendation follows the consensus statement: Guidelines for Management of Incidental Pulmonary Nodules Detected on CT Images: From the Fleischner Society 2017; Radiology 2017; 284:228-243. 6. Additional incidental findings, as above. Aortic Atherosclerosis (ICD10-I70.0) and Emphysema (ICD10-J43.9).   ASSESSMENT & PLAN:  Bobby Caldwell is a 61 y.o. [place] male with a history of heavy smoking and COPD, peripheral vascular disease, presents with fatigue, anorexia, early satiety and weight loss   1. Gastric Adenocarcinoma, [id number]  -I reviewed his EGD findings and biopsy pathology results that show gastric adenocarcinoma. He had CT chest, abdomen and pelvis with contrast for his peripheral vascular disease on 04/02/2017, which shows no sign of metastasis at that time.  -I will repeat staging scan to rule out new distant metastasis since last scan.  -I suggest a EUS for tumor staging. If T1 or T2 lesion, I will recommend upfront surgery, followed by adjuvant chemo if needed. If it's T3 or T4 lesion, I would recommend neoadjuvant chemo first. The periop chemo FLOT4 (5-fu, oxaliplatin, and docetaxel) has showed superior outcome for gastric cancer. However, due to his poor nutrition status,  comorbidities, and poor performance status, he may not be able to tolerate such intensive chemotherapy.  I will consider FOLFOX as the first cycle of neoadjuvant chemo, his nutrition status improved and he tolerates well, will consider change to FLOT4 for 2 more cycles before surgery  -Pt prefers to have surgery first, but is to consider neoadjuvant chemotherapy if we recommends.  - I will set up consult with our dietitian. He may benefit from J-tube placement and tube feeds to improve  his nutrition status  -05/09/17 labs show he is anemic. I will do lab at next visit to monitor his anemia and the need for any intervention. I currently suggest he take OTC multivitamin and iron pill.  -He will do a chemo class in 2 weeks and get his endoscopy and CT CAP within his  -Will schedule future appointments in the morning so he can come with his interpreter.  -f/u in 2 weeks    2. Anorexia, nausea and weight loss  -He has loss 14 pounds in the past 6 months  -I will set up nutrition consult -I called in compazine today for his nausea  -I will discuss J-tube placement with Dr. [name]    3. COPD, Smoking Cessation  -He reduced from 50 cigars a day to 10 cigars a day. He is actively trying to quit.  -I discussed his chest pain is due to his COPD and smoking.    4. Financial support -I will refer him to our social worker for help with financial help, he is no longer able to work and would like apply for disability.     PLAN:  -CT CAP with contrast in 1-2 weeks  -Lab, Chemo class and f/u in 2 weeks  -Nutrition consult and SW consult this week  -I discussed with Dr [name], he will try to do EUS this week  -probable Port placement, diagnostic lapascopy and J-tube placement by Dr. [name] next week    Orders Placed This Encounter  Procedures  . CT Abdomen Pelvis W Contrast    Standing Status:   Future    Standing Expiration Date:   06/05/2018    Order Specific Question:   If indicated for the  ordered procedure, I authorize the administration of contrast media per Radiology protocol    Answer:   Yes    Order Specific Question:   Preferred imaging location?    Answer:   [facility]    Order Specific Question:   Radiology Contrast Protocol - do NOT remove file path    Answer:   [email or web address]  . CT Chest W Contrast    Standing Status:   Future    Standing Expiration Date:   06/05/2018    Order Specific Question:   If indicated for the ordered procedure, I authorize the administration of contrast media per Radiology protocol    Answer:   Yes    Order Specific Question:   Preferred imaging location?    Answer:   [facility]    Order Specific Question:   Radiology Contrast Protocol - do NOT remove file path    Answer:   [email or web address]  . CBC with Differential    Standing Status:   Standing    Number of Occurrences:   100    Standing Expiration Date:   06/05/2022  . Comprehensive metabolic panel    Standing Status:   Standing    Number of Occurrences:   100    Standing Expiration Date:   06/05/2022  . Ferritin    Standing Status:   Future    Standing Expiration Date:   06/05/2018  . Iron and TIBC    Standing Status:   Future    Standing Expiration Date:   06/05/2018  . CEA    Standing Status:   Standing    Number of Occurrences:   100    Standing Expiration Date:   06/05/2022    All questions were answered. The patient knows to  call the clinic with any problems, questions or concerns. I spent 55 minutes counseling the patient face to face. The total time spent in the appointment was 60 minutes and more than 50% was on counseling.     [name], MD 06/05/2017    This document serves as a record of services personally performed by [name], MD. It was created on her behalf by Joslyn Devon, a trained medical scribe. The creation of this record is based on the scribe's personal  observations and the provider's statements to them.   I have reviewed the above documentation for accuracy and completeness, and I agree with the above.

## 2017-06-05 NOTE — Progress Notes (Signed)
  Oncology Nurse Navigator Documentation  Navigator Location: [facility] (06/05/17 1635) Referral date to RadOnc/MedOnc: 05/29/17 (06/05/17 1635) )Navigator Encounter Type: Initial MedOnc (06/05/17 1635)     Confirmed Diagnosis Date: 05/24/17 (06/05/17 1635)               Patient Visit Type: Initial;MedOnc (06/05/17 1635) Treatment Phase: Pre-Tx/Tx Discussion (06/05/17 1635)     Interventions: Coordination of Care;Psycho-social support (06/05/17 1635)    Met with patient during his initial med/onc appointment with the assistance of an interpreter. Patient speaks [place]. Patient provided with my contact information and verbalized understanding that he can call with questions or concerns. I contacted Javata Epps to arrange interpreter for chemo education class and appointment with Dr. Burr Medico on 06/19/17.         Acuity: Level 2 (06/05/17 1635)   Acuity Level 2: Initial guidance, education and coordination as needed;Educational needs;Ongoing guidance and education throughout treatment as needed;Other(Securing interpreter) (06/05/17 1635)     Time Spent with Patient: 30 (06/05/17 1635)

## 2017-06-05 NOTE — Progress Notes (Signed)
Nutrition  Attempted to see patient following MD appointment today but unable to meet with patient.  Patient scheduled for nutrition appointment on Friday 11/30 with Dory Peru.   Verma Grothaus B. Zenia Resides, [place], [place] Registered Dietitian [phone] (pager)

## 2017-06-05 NOTE — Telephone Encounter (Signed)
Gave patient avs report and appointments for November and December. [facility] will call re scan. Spoke with SW re referral.

## 2017-06-06 ENCOUNTER — Telehealth: Payer: Self-pay

## 2017-06-06 ENCOUNTER — Other Ambulatory Visit: Payer: Self-pay

## 2017-06-06 DIAGNOSIS — C162 Malignant neoplasm of body of stomach: Secondary | ICD-10-CM

## 2017-06-06 NOTE — Telephone Encounter (Signed)
EUS scheduled, pt instructed and medications reviewed.  Patient instructions mailed to home.  Patient to call with any questions or concerns. Per Bobby Caldwell he can stay on his plavix.

## 2017-06-06 NOTE — Telephone Encounter (Signed)
-----   Message from [name], MD sent at 06/05/2017  8:58 PM EST ----- SHould be able to get it in this week.  Jozef Eisenbeis, He needs upper EUS this Thursday.  Move my 1pm colonoscopy to later in the day (OK for the colonoscopy to be with moderate sedation if MAC is not available).   For gastric cancer staging.  Thanks  ----- Message ----- From: [name], MD Sent: 06/05/2017   5:08 PM To: [name], MD, [name], MD  Dan, any possibility of EUS this week or early next week?  I am doctor of week at [facility] next week, so could do dx lap/J tube next week.  Don't know if you can do EUS in my room, but might be able to work that out. tx FB  ----- Message ----- From: [name], MD Sent: 06/05/2017   4:00 PM To: [name], MD, [name], MD  Bari Mantis,  I saw him today. Due to his poor nutrition status, comorbilities and limited PS, I do not think he is a candidate for very intensive chemo FLOT4, but we can try FOLFOX as neoadjuvant if needed. He prefers to have surgery first, but OK to have neoadjuvant chemo if we recommend.   I think EUS to evaluate his T stage will be helpful. If T1 or T2, then do surgery first, if T3/4 or positive node, then neoadjuvant chemo. His last CT scan was two months ago, I will repeat it next week.   Dorris Fetch, although he does not have outlet obstruction, he is not tolerating oral intake much. If he does neoadjuvant chemo, could you get a J-tube in for nutrition?  See you in the conference tomorrow morning,  Krista Blue  ----- Message ----- From: [name], MD Sent: 06/05/2017  12:44 PM To: Karie Mainland, RD, [name], MD  Dr. Donald Pore,  Can this patient please get nutrition consult today at cancer center?  tx FB

## 2017-06-06 NOTE — Progress Notes (Signed)
CALLED PT NO ANSWER

## 2017-06-07 ENCOUNTER — Encounter (HOSPITAL_COMMUNITY): Payer: Self-pay | Admitting: *Deleted

## 2017-06-07 ENCOUNTER — Encounter (HOSPITAL_COMMUNITY)
Admission: RE | Disposition: A | Discharge status: Home / Self Care | Payer: Self-pay | Source: Ambulatory Visit | Attending: Gastroenterology

## 2017-06-07 ENCOUNTER — Ambulatory Visit (HOSPITAL_COMMUNITY): Payer: BLUE CROSS/BLUE SHIELD | Admitting: Anesthesiology

## 2017-06-07 ENCOUNTER — Ambulatory Visit (HOSPITAL_COMMUNITY)
Admission: RE | Admit: 2017-06-07 | Discharge: 2017-06-07 | Disposition: A | Discharge status: Home / Self Care | Payer: BLUE CROSS/BLUE SHIELD | Source: Ambulatory Visit | Attending: Gastroenterology | Admitting: Gastroenterology

## 2017-06-07 ENCOUNTER — Other Ambulatory Visit: Payer: Self-pay

## 2017-06-07 DIAGNOSIS — R634 Abnormal weight loss: Secondary | ICD-10-CM | POA: Insufficient documentation

## 2017-06-07 DIAGNOSIS — I739 Peripheral vascular disease, unspecified: Secondary | ICD-10-CM | POA: Insufficient documentation

## 2017-06-07 DIAGNOSIS — Z79899 Other long term (current) drug therapy: Secondary | ICD-10-CM | POA: Insufficient documentation

## 2017-06-07 DIAGNOSIS — Z95828 Presence of other vascular implants and grafts: Secondary | ICD-10-CM | POA: Insufficient documentation

## 2017-06-07 DIAGNOSIS — C169 Malignant neoplasm of stomach, unspecified: Secondary | ICD-10-CM | POA: Diagnosis not present

## 2017-06-07 DIAGNOSIS — Z7902 Long term (current) use of antithrombotics/antiplatelets: Secondary | ICD-10-CM | POA: Insufficient documentation

## 2017-06-07 DIAGNOSIS — C162 Malignant neoplasm of body of stomach: Secondary | ICD-10-CM

## 2017-06-07 DIAGNOSIS — R630 Anorexia: Secondary | ICD-10-CM | POA: Diagnosis not present

## 2017-06-07 DIAGNOSIS — J449 Chronic obstructive pulmonary disease, unspecified: Secondary | ICD-10-CM | POA: Diagnosis not present

## 2017-06-07 DIAGNOSIS — K3189 Other diseases of stomach and duodenum: Secondary | ICD-10-CM

## 2017-06-07 DIAGNOSIS — R110 Nausea: Secondary | ICD-10-CM | POA: Diagnosis not present

## 2017-06-07 DIAGNOSIS — F17210 Nicotine dependence, cigarettes, uncomplicated: Secondary | ICD-10-CM | POA: Diagnosis not present

## 2017-06-07 HISTORY — PX: EUS: SHX5427

## 2017-06-07 SURGERY — UPPER ENDOSCOPIC ULTRASOUND (EUS) RADIAL
Anesthesia: Monitor Anesthesia Care

## 2017-06-07 MED ORDER — LIDOCAINE 2% (20 MG/ML) 5 ML SYRINGE
INTRAMUSCULAR | Status: AC
  Filled 2017-06-07: qty 5

## 2017-06-07 MED ORDER — PROPOFOL 10 MG/ML IV BOLUS
INTRAVENOUS | Status: AC
  Filled 2017-06-07: qty 20

## 2017-06-07 MED ORDER — PROPOFOL 500 MG/50ML IV EMUL
INTRAVENOUS | Status: DC | PRN
  Administered 2017-06-07: 150 ug/kg/min via INTRAVENOUS

## 2017-06-07 MED ORDER — LACTATED RINGERS IV SOLN
INTRAVENOUS | Status: DC
Start: 2017-06-07 — End: 2017-06-07
  Administered 2017-06-07: 11:00:00 via INTRAVENOUS

## 2017-06-07 MED ORDER — PROPOFOL 10 MG/ML IV BOLUS
INTRAVENOUS | Status: DC | PRN
  Administered 2017-06-07: 30 mg via INTRAVENOUS

## 2017-06-07 MED ORDER — SODIUM CHLORIDE 0.9 % IV SOLN: INTRAVENOUS | Status: DC

## 2017-06-07 MED ORDER — LIDOCAINE 2% (20 MG/ML) 5 ML SYRINGE
INTRAMUSCULAR | Status: DC | PRN
  Administered 2017-06-07: 50 mg via INTRAVENOUS

## 2017-06-07 NOTE — Op Note (Signed)
[facility] Patient Name: Bobby Caldwell Procedure Date: 06/07/2017 MRN: [id number] Attending MD: [name] , MD Date of Birth: [birth year] CSN: [id number] Age: 61 Admit Type: Outpatient Procedure:                Upper EUS Indications:              Pre-treatment staging of gastric adenocarcinoma Providers:                [name], MD, Cleda Daub, RN, William Dalton, Technician Referring MD:             [name], MD Medicines:                Monitored Anesthesia Care Complications:            No immediate complications. Estimated blood loss:                            None. Estimated Blood Loss:     Estimated blood loss: none. Procedure:                Pre-Anesthesia Assessment:                           - Prior to the procedure, a History and Physical                            was performed, and patient medications and                            allergies were reviewed. The patient's tolerance of                            previous anesthesia was also reviewed. The risks                            and benefits of the procedure and the sedation                            options and risks were discussed with the patient.                            All questions were answered, and informed consent                            was obtained. Prior Anticoagulants: The patient has                            taken Plavix (clopidogrel), last dose was day of                            procedure. ASA Grade Assessment: IV - A patient  with severe systemic disease that is a constant                            threat to life. After reviewing the risks and                            benefits, the patient was deemed in satisfactory                            condition to undergo the procedure.                           After obtaining informed consent, the endoscope was                            passed under direct  vision. Throughout the                            procedure, the patient's blood pressure, pulse, and                            oxygen saturations were monitored continuously. The                            [id number] ([id number]) scope was introduced through                            the mouth, and advanced to the second part of                            duodenum. The upper EUS was accomplished without                            difficulty. The patient tolerated the procedure                            well. Scope In: Scope Out: Findings:      Endoscopic Finding :      1. The examined esophagus was endoscopically normal.      2. Malignant, slightly ulcerated mass in the distal stomach. The mass       occupies about 1/2 the circumference of the distal stomach, measures       approximately 5cm across, and the distal edge is 3-4cm from the pylorus.      3. The examined duodenum was endoscopically normal.      Endosonographic Finding :      1. The mass above correlates with a hypoechoic and heterogenous       non-circumferential lesion that passes into but not throug, the       muscularis propria layer of the gastric wall (uT2)      2. No perigastric adenopathy.      3. Limited views of the liver, pancreas, spleen were all normal. Impression:               5cm across, partially circumferential uT2N0 gastric  adenocarcinoma laying in the distal stomach 3-4cm                            proximal to the pylorus. Moderate Sedation:      N/A- Per Anesthesia Care Recommendation:           - Discharge patient to home (ambulatory). Procedure Code(s):        --- Professional ---                           [phone], Esophagogastroduodenoscopy, flexible,                            transoral; with endoscopic ultrasound examination                            limited to the esophagus, stomach or duodenum, and                            adjacent structures Diagnosis Code(s):         --- Professional ---                           K31.89, Other diseases of stomach and duodenum                           C16.9, Malignant neoplasm of stomach, unspecified CPT copyright 2016 [employer]. All rights reserved. The codes documented in this report are preliminary and upon coder review may  be revised to meet current compliance requirements. [name], MD 06/07/2017 11:25:09 AM This report has been signed electronically. Number of Addenda: 0

## 2017-06-07 NOTE — Interval H&P Note (Signed)
History and Physical Interval Note:  06/07/2017 10:22 AM  Bobby Caldwell  has presented today for surgery, with the diagnosis of cancer staging  The various methods of treatment have been discussed with the patient and family. After consideration of risks, benefits and other options for treatment, the patient has consented to  Procedure(s): UPPER ENDOSCOPIC ULTRASOUND (EUS) RADIAL (N/A) as a surgical intervention .  The patient's history has been reviewed, patient examined, no change in status, stable for surgery.  I have reviewed the patient's chart and labs.  Questions were answered to the patient's satisfaction.     [name]

## 2017-06-07 NOTE — Transfer of Care (Signed)
Immediate Anesthesia Transfer of Care Note  Patient: Bobby Caldwell  Procedure(s) Performed: UPPER ENDOSCOPIC ULTRASOUND (EUS) RADIAL (N/A )  Patient Location: PACU  Anesthesia Type:MAC  Level of Consciousness: awake, alert  and oriented  Airway & Oxygen Therapy: Patient Spontanous Breathing and Patient connected to nasal cannula oxygen  Post-op Assessment: Report given to RN and Post -op Vital signs reviewed and stable  Post vital signs: Reviewed and stable  Last Vitals:  Vitals:   06/07/17 1020  BP: (!) 154/99  Pulse: 94  Resp: 18  Temp: (!) 36.4 C  SpO2: 98%    Last Pain:  Vitals:   06/07/17 1020  TempSrc: Oral  PainSc: 2          Complications: No apparent anesthesia complications

## 2017-06-07 NOTE — Anesthesia Postprocedure Evaluation (Signed)
Anesthesia Post Note  Patient: Bobby Caldwell  Procedure(s) Performed: UPPER ENDOSCOPIC ULTRASOUND (EUS) RADIAL (N/A )     Patient location during evaluation: PACU Anesthesia Type: MAC Level of consciousness: awake and alert Pain management: pain level controlled Vital Signs Assessment: post-procedure vital signs reviewed and stable Respiratory status: spontaneous breathing, nonlabored ventilation, respiratory function stable and patient connected to nasal cannula oxygen Cardiovascular status: stable and blood pressure returned to baseline Postop Assessment: no apparent nausea or vomiting Anesthetic complications: no    Last Vitals:  Vitals:   06/07/17 1115 06/07/17 1120  BP: 116/80 128/78  Pulse: 79 84  Resp: (!) 24 (!) 25  Temp:    SpO2: 99% 98%    Last Pain:  Vitals:   06/07/17 1020  TempSrc: Oral  PainSc: 2                  [name]

## 2017-06-07 NOTE — Discharge Instructions (Signed)
Esophagogastroduodenoscopy, Care After °Refer to this sheet in the next few weeks. These instructions provide you with information about caring for yourself after your procedure. Your health care provider may also give you more specific instructions. Your treatment has been planned according to current medical practices, but problems sometimes occur. Call your health care provider if you have any problems or questions after your procedure. °What can I expect after the procedure? °After the procedure, it is common to have: °· A sore throat. °· Nausea. °· Bloating. °· Dizziness. °· Fatigue. ° °Follow these instructions at home: °· Do not eat or drink anything until the numbing medicine (local anesthetic) has worn off and your gag reflex has returned. You will know that the local anesthetic has worn off when you can swallow comfortably. °· Do not drive for 24 hours if you received a medicine to help you relax (sedative). °· If your health care provider took a tissue sample for testing during the procedure, make sure to get your test results. This is your responsibility. Ask your health care provider or the department performing the test when your results will be ready. °· Keep all follow-up visits as told by your health care provider. This is important. °Contact a health care provider if: °· You cannot stop coughing. °· You are not urinating. °· You are urinating less than usual. °Get help right away if: °· You have trouble swallowing. °· You cannot eat or drink. °· You have throat or chest pain that gets worse. °· You are dizzy or light-headed. °· You faint. °· You have nausea or vomiting. °· You have chills. °· You have a fever. °· You have severe abdominal pain. °· You have black, tarry, or bloody stools. °This information is not intended to replace advice given to you by your health care provider. Make sure you discuss any questions you have with your health care provider. °Document Released: 06/12/2012 Document  Revised: 12/02/2015 Document Reviewed: 05/20/2015 °Elsevier Interactive Patient Education © 2018 [employer] ° °

## 2017-06-07 NOTE — Anesthesia Preprocedure Evaluation (Addendum)
Anesthesia Evaluation  Patient identified by MRN, date of birth, ID band Patient awake    Reviewed: Allergy & Precautions, NPO status , Patient's Chart, lab work & pertinent test results  Airway Mallampati: II  TM Distance: >3 FB Neck ROM: Full    Dental no notable dental hx.    Pulmonary COPD, Current Smoker,     (-) decreased breath sounds+ wheezing      Cardiovascular + Peripheral Vascular Disease  Normal cardiovascular exam Rhythm:Regular Rate:Normal     Neuro/Psych negative neurological ROS  negative psych ROS   GI/Hepatic negative GI ROS, Neg liver ROS,   Endo/Other  negative endocrine ROS  Renal/GU negative Renal ROS  negative genitourinary   Musculoskeletal negative musculoskeletal ROS (+)   Abdominal   Peds negative pediatric ROS (+)  Hematology negative hematology ROS (+)   Anesthesia Other Findings   Reproductive/Obstetrics negative OB ROS                            Anesthesia Physical Anesthesia Plan  ASA: III  Anesthesia Plan: MAC   Post-op Pain Management:    Induction: Intravenous  PONV Risk Score and Plan: 0  Airway Management Planned: Simple Face Mask  Additional Equipment:   Intra-op Plan:   Post-operative Plan:   Informed Consent: I have reviewed the patients History and Physical, chart, labs and discussed the procedure including the risks, benefits and alternatives for the proposed anesthesia with the patient or authorized representative who has indicated his/her understanding and acceptance.   Dental advisory given  Plan Discussed with: CRNA and Surgeon  Anesthesia Plan Comments:         Anesthesia Quick Evaluation

## 2017-06-08 ENCOUNTER — Telehealth: Payer: Self-pay | Admitting: Hematology

## 2017-06-08 ENCOUNTER — Encounter: Payer: BLUE CROSS/BLUE SHIELD | Admitting: Nutrition

## 2017-06-08 ENCOUNTER — Encounter: Payer: Self-pay | Admitting: Nutrition

## 2017-06-08 NOTE — Telephone Encounter (Signed)
Patient reschedule his appointment that was missed

## 2017-06-08 NOTE — Progress Notes (Signed)
Patient did not show up for scheduled nutrition appointment. 

## 2017-06-09 HISTORY — PX: NM MYOVIEW LTD: HXRAD82

## 2017-06-11 ENCOUNTER — Encounter (HOSPITAL_COMMUNITY): Payer: Self-pay | Admitting: Gastroenterology

## 2017-06-12 ENCOUNTER — Ambulatory Visit (INDEPENDENT_AMBULATORY_CARE_PROVIDER_SITE_OTHER): Payer: BLUE CROSS/BLUE SHIELD | Admitting: Cardiology

## 2017-06-12 ENCOUNTER — Encounter: Payer: Self-pay | Admitting: Cardiology

## 2017-06-12 VITALS — BP 146/98 | HR 72 | Ht 66.0 in | Wt 111.0 lb

## 2017-06-12 DIAGNOSIS — I208 Other forms of angina pectoris: Secondary | ICD-10-CM | POA: Diagnosis not present

## 2017-06-12 DIAGNOSIS — I10 Essential (primary) hypertension: Secondary | ICD-10-CM | POA: Insufficient documentation

## 2017-06-12 DIAGNOSIS — I739 Peripheral vascular disease, unspecified: Secondary | ICD-10-CM | POA: Diagnosis not present

## 2017-06-12 DIAGNOSIS — C162 Malignant neoplasm of body of stomach: Secondary | ICD-10-CM

## 2017-06-12 DIAGNOSIS — I70213 Atherosclerosis of native arteries of extremities with intermittent claudication, bilateral legs: Secondary | ICD-10-CM | POA: Diagnosis not present

## 2017-06-12 DIAGNOSIS — R0609 Other forms of dyspnea: Secondary | ICD-10-CM | POA: Diagnosis not present

## 2017-06-12 DIAGNOSIS — J431 Panlobular emphysema: Secondary | ICD-10-CM

## 2017-06-12 DIAGNOSIS — Z01810 Encounter for preprocedural cardiovascular examination: Secondary | ICD-10-CM | POA: Diagnosis not present

## 2017-06-12 DIAGNOSIS — R0789 Other chest pain: Secondary | ICD-10-CM | POA: Insufficient documentation

## 2017-06-12 DIAGNOSIS — F17210 Nicotine dependence, cigarettes, uncomplicated: Secondary | ICD-10-CM

## 2017-06-12 MED ORDER — METOPROLOL TARTRATE 50 MG PO TABS: 50.0000 mg | ORAL_TABLET | Freq: Once | ORAL | 0 refills | Status: DC

## 2017-06-12 NOTE — Progress Notes (Signed)
PCP: Dr. Glendale Chard & Minette Brine NP [facility] Dr. Lenna Gilford - Pulmonary Medicine Vascular Surgeon: Dr. Glennon Hamilton  Clinic Note: Chief Complaint  Patient presents with  . Hospitalization Follow-up    Initial consult for preop clearance.  Has severe PAD and gastric cancer.  . Headache  . Shortness of Breath    Has gotten worse.  . Chest Pain    Mostly when he is light headed and dizzy. Goes fast.  . Cough    Notably increased slightly    HPI: Bobby Caldwell is a 61 y.o. 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 Bobby Caldwell is a 61 y.o. male who was initially seen the hospital 05/04/2017 for the preoperative evaluation prior to femorofemoral bypass the request of Dr Adele Barthel. He originally from [place], & is a life long smoker & has paid a price for this behavior w/ severe emphysema & severe occlusive PAD.    Bobby Caldwell was initially seen in consultation while inpatient following his lower extremity angiogram and left internal iliac angioplasty/stenting.  At that time he was noticing significant exertional dyspnea but no more than his usual.  Based on the significant nature of his lower extremity disease, recommendation was to proceed with his the peripheral vascular surgery without additional cardiac evaluation and then cardiac evaluation following.  Recent Hospitalizations:   October 25 -26 , 2018: Admitted for critical limb ischemia -> aortogram with bilateral leg runoff on October 25. 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  Subtotal occlusion of left common iliac artery -> to salvage femorofemoral bypass (as opposed to aortobifem which was thought to be a much higher risk surgery given his severe COPD): Treated with left common iliac angioplasty and stenting  with VBX covered stent (monitored overnight because of left groin bleeding post procedure.  Seen for preop evaluation at that time --> given no active chest pain or pressure.  Dyspnea but not likely associated with CHF, lack of diabetes or severe renal insufficiency, stroke, I felt it prudent to proceed with surgery without further evaluation.  Status post L-R femorofemoral bypass October 30 --> seen for postop follow-up on November 16.  Noted resolution of right lower extremity symptoms.  EGD May 24, 2017 ( Dr. [name] -- for wgt. Loss, with poor p.o. intake, and abdominal pain.  He notes early satiety and nausea/vomiting.  Has become more more debilitated over the last 6 months with a 14 pound weight loss.) -->  Ulcerated mass in the distal stomach -> biopsy confirmed gastric adenocarcinoma along with chronic active gastritis and intestinal metaplasia --> diagnosed on November 26 with gastric cancer  Studies Personally Reviewed - (if available, images/films reviewed: From Epic Chart or Care Everywhere)  Postop ABIs 05/22/2017: RABI 1.14, LABI 1.08)  May 03, 2017: Peripheral vascular catheterization Poorly imaged flow to right leg: occluded right iliac arterial flow, collaterals via SMA and pelvic circulation  Sub-total occlusion of L CIA: resolved with stenting  Celiac artery occlusion  SMA patent with minimal proximal stenosis  Patient will need femorofemoral bypass to restore flow to right leg  No other studies   Interview with the assistance of a contracted Interpreter for [place] / [place] dialect.  Interval History: Bobby Caldwell returns now for post hospital follow-up having recently now been diagnosed with gastric cancer.  He has been seen by Dr. [name] from surgery, and Dr. Burr Medico from hematology oncology after being diagnosed by Dr. [name].  He continues to lose weight and continues to be fatigued.  But he has been noticing even worsening exertional dyspnea since discharge.  He is  still very weak from his surgery and has having a hard time with recovering.  Although his claudication seems to be improved, he really has not been able to do any kind of activity.  He is profoundly weak and dyspneic.  He also notes episodes of chest discomfort as well as epigastric discomfort.  It is hard to tell whether it is chest or abdomen.  He has been having lots of coughing episodes and the significant amount of coughing has been also led to chest pain and shortness of breath.  When these coughing episodes occur, his heart rate will increase and he will start feeling jittery.  That is when it starts to hurt in his chest.  He gets short of breath doing just about anything including lying down, but has not noted PND type symptoms. He has been dizzy and lightheaded especially when he coughs, but has not had syncope or near syncope.  In addition to getting chest pain he also gets a headache when he has been coughing so much.  He is not necessarily noting chest discomfort with exertion per se, but has not exerted himself any.    He continues to get weaker because he is really not able to tolerate any food. He is doing the best he can but is yet to fully complete smoking cessation.  He clearly has no edema and has not had a TIA/amaurosis fugax symptoms.  Despite having gastric cancer, he is not aware of any melena or hematochezia.  No hematuria or nosebleeds.  ROS: A comprehensive was performed. Review of Systems  Constitutional: Positive for malaise/fatigue and weight loss.       Not really fevers or chills, but he has felt hot and flushed  HENT: Negative for congestion.   Eyes: Positive for blurred vision.  Respiratory: Positive for cough (Worsening cough), sputum production, shortness of breath and wheezing.   Cardiovascular:       Per HPI  Gastrointestinal: Positive for abdominal pain, nausea and vomiting. Negative for blood in stool, diarrhea and melena.  Genitourinary: Negative for  hematuria.  Musculoskeletal: Positive for back pain and joint pain. Negative for falls.  Neurological: Positive for dizziness, weakness and headaches.  Psychiatric/Behavioral: Negative for depression and memory loss. The patient is not nervous/anxious.   All other systems reviewed and are negative.    I have reviewed and (if needed) personally updated the patient's problem list, medications, allergies, past medical and surgical history, social and family history.   Past Medical History:  Diagnosis Date  . COPD (chronic obstructive pulmonary disease) ([place])   . Dyspnea   . PAD (peripheral artery disease) ([place])   . Stomach cancer ([place])   . Tobacco abuse   . Weight loss     Past Surgical History:  Procedure Laterality Date  . ABDOMINAL AORTOGRAM W/LOWER EXTREMITY N/A 05/03/2017   Procedure: ABDOMINAL AORTOGRAM W/LOWER EXTREMITY;  Surgeon: Conrad Collinston, MD;  Location: [facility];  Service: Cardiovascular;  Laterality: N/A;  . ESOPHAGOGASTRODUODENOSCOPY     05/24/17  . ESOPHAGOGASTRODUODENOSCOPY (EGD) WITH PROPOFOL N/A 05/24/2017   Procedure: ESOPHAGOGASTRODUODENOSCOPY (EGD) WITH PROPOFOL;  Surgeon: [name], MD;  Location: [facility];  Service: Endoscopy;  Laterality: N/A;  .  EUS N/A 06/07/2017   Procedure: UPPER ENDOSCOPIC ULTRASOUND (EUS) RADIAL;  Surgeon: [name], MD;  Location: [facility];  Service: Endoscopy;  Laterality: N/A;  . FEMORAL-FEMORAL BYPASS GRAFT Bilateral 05/08/2017   Procedure: LEFT TO RIGHT BYPASS GRAFT FEMORAL-FEMORAL ARTERY;  Surgeon: Conrad Latimer, MD;  Location: [facility];  Service: Vascular;  Laterality: Bilateral;  . PERIPHERAL VASCULAR INTERVENTION Left 05/03/2017   Procedure: PERIPHERAL VASCULAR INTERVENTION;  Surgeon: Conrad , MD;  Location: [facility];  Service: Cardiovascular;  Laterality: Left;  common iliac    Current Meds  Medication Sig  . Albuterol Sulfate (PROAIR RESPICLICK) 130 (90 Base) MCG/ACT AEPB Inhale  2 puffs into the lungs every 6 (six) hours as needed. (Patient taking differently: Inhale 2 puffs into the lungs every 6 (six) hours as needed (shortness of breath). )  . atorvastatin (LIPITOR) 10 MG tablet Take 1 tablet (10 mg total) by mouth daily.  . budesonide-formoterol (SYMBICORT) 160-4.5 MCG/ACT inhaler Inhale 2 puffs into the lungs 2 (two) times daily.  . clopidogrel (PLAVIX) 75 MG tablet Take 1 tablet (75 mg total) by mouth daily.  Marland Kitchen oxyCODONE-acetaminophen (ROXICET) 5-325 MG tablet Take 1 tablet by mouth every 6 (six) hours as needed for severe pain.  Marland Kitchen prochlorperazine (COMPAZINE) 10 MG tablet Take 1 tablet (10 mg total) by mouth every 6 (six) hours as needed for nausea or vomiting.  . Tiotropium Bromide Monohydrate (SPIRIVA RESPIMAT) 2.5 MCG/ACT AERS Inhale 2 puffs into the lungs daily.    No Known Allergies  Social History   Socioeconomic History  . Marital status: Married    Spouse name: None  . Number of children: 4  . Years of education: None  . Highest education level: None  Social Needs  . Financial resource strain: None  . Food insecurity - worry: None  . Food insecurity - inability: None  . Transportation needs - medical: None  . Transportation needs - non-medical: None  Occupational History  . None  Tobacco Use  . Smoking status: Current Every Day Smoker    Packs/day: 1.00    Years: 50.00    Pack years: 50.00    Types: Cigarettes  . Smokeless tobacco: Never Used  . Tobacco comment: 10 cigarettes per day  Substance and Sexual Activity  . Alcohol use: No    Alcohol/week: 0.0 oz  . Drug use: No  . Sexual activity: Yes  Other Topics Concern  . None  Social History Narrative  . None    Family history is unknown by patient.  Wt Readings from Last 3 Encounters:  06/12/17 111 lb (50.3 kg)  06/05/17 113 lb 6.4 oz (51.4 kg)  05/25/17 106 lb (48.1 kg)    PHYSICAL EXAM BP (!) 146/98   Pulse 72   Ht 5\' 6"  (1.676 m)   Wt 111 lb (50.3 kg)   BMI 17.92  kg/m  Physical Exam  Constitutional: He is oriented to person, place, and time.  Thin, frail cachectic gentleman with temporal wasting.  Gaunt appearing.  He is well-groomed and does not appear to be in acute distress, but is chronically ill-appearing.  HENT:  Head: Normocephalic and atraumatic.  Mouth/Throat: No oropharyngeal exudate.  Temporal wasting  Eyes: EOM are normal. No scleral icterus.  Neck: Normal range of motion. Neck supple. No hepatojugular reflux and no JVD present. Carotid bruit is present (Soft bilateral carotid bruits).  Cardiovascular: Normal rate, regular rhythm and intact distal pulses.  Occasional extrasystoles are present. PMI is  not displaced (Hyperdynamic precordium). Exam reveals distant heart sounds (Very hard to hear because of coarse breath sounds) and decreased pulses (But palpable). Exam reveals no gallop and no friction rub.  No murmur heard. Feet are warm  Pulmonary/Chest: No respiratory distress (Simply baseline increased work of breathing). He has wheezes (Diffuse expiratory). He has no rales.  Baseline increased work of breath.  Coarse diffuse rhonchorous breath sounds with expiratory wheezing.  Difficult to tell it does not sound like wet rales.  More like crackles and rhonchi.  Abdominal: He exhibits no distension. There is tenderness (Epigastric and right upper quadrant). There is guarding. There is no rebound.  Scaphoid abdomen with diffuse lower quadrant tenderness to palpation and as well as epigastric pain.  Musculoskeletal: Normal range of motion. He exhibits no edema.  Neurological: He is alert and oriented to person, place, and time. No cranial nerve deficit.  Skin:  Warm but dry scaly skin.  No obvious rashes or lesions  Psychiatric:  He seems to be somewhat somber, subdued.  Almost let down.  He has his questions appropriately and is awake and alert.  Occasionally provides animated answers to the interpreters questions.  Nursing note and vitals  reviewed.    Adult ECG Report  Rate: 72 ;  Rhythm: Unable to see P waves.  Suspected accelerated junctional rhythm.  Otherwise relatively normal QRS complexes.;   Narrative Interpretation: No obvious P waves.  Difficult to determine true rhythm.   Other studies Reviewed: Additional studies/ records that were reviewed today include:  Recent Labs:   Lab Results  Component Value Date   CREATININE 0.50 (L) 05/09/2017   BUN 9 05/09/2017   NA 136 05/09/2017   K 3.9 05/09/2017   CL 102 05/09/2017   CO2 28 05/09/2017   No results found for: HGBA1C CBC Latest Ref Rng & Units 05/09/2017 05/08/2017 05/04/2017  WBC 4.0 - 10.5 K/uL 14.5(H) 8.1 9.6  Hemoglobin 13.0 - 17.0 g/dL 11.9(L) 13.4 13.6  Hematocrit 39.0 - 52.0 % 35.1(L) 39.4 40.4  Platelets 150 - 400 K/uL 255 212 179    No results found for: CHOL, HDL, LDLCALC, LDLDIRECT, TRIG, CHOLHDL   ASSESSMENT / PLAN: Problem List Items Addressed This Visit    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  Need aggressive risk modification including lipid management, blood pressure control, and smoking cessation.  He does not have diabetes  Plan: He is on Plavix and statin.  I will increase his statin dose to 40 mg (or even 80mg ) daily on return visit. --We will need to check his lipids.  As PAD is a CAD Risk Equivalent, will check coronary CT angiogram to exclude significant native CAD with potential upcoming surgery and chemotherapy.      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  Now with significant exertional dyspnea and chest pain.  Chest pain sounds relatively atypical and may very well be related to his coughing, however we need to exclude ischemic CAD as he may have upcoming abdominal surgery which is higher risk than his femorofemoral bypass surgery.  With a severe aortoiliac disease, I do  not want to miss any upstream proximal vessel CAD and therefore would not want to run the risk of the potential false negative nuclear stress test.  Plan: Coronary CT angiogram with possible CT FFR.      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 He is being followed by Dr. Lenna Gilford from pulmonary medicine.  As part of our evaluation --  2D echocardiogram will help assess for evidence of any cor pulmonale.      DOE (dyspnea on exertion)    Progressively worsening to the point where he can barely walk across the room.. Likely related to his COPD, cannot exclude coexisting coronary disease and even possible CHF.  Plan: 2D echocardiogram and coronary CT angiogram/CT FFR      Relevant Orders   EKG 12-Lead (Completed)   ECHOCARDIOGRAM COMPLETE   CT CORONARY MORPH W/CTA COR W/SCORE W/CA W/CM &/OR WO/CM   CT CORONARY FRACTIONAL FLOW RESERVE DATA PREP   CT CORONARY FRACTIONAL FLOW RESERVE FLUID ANALYSIS   Basic metabolic panel   Essential hypertension (Chronic)    Quite hypertensive today.  He is not on any medications.  We will need to reassess on follow-up.  Beta-blocker bisoprolol.  But could use amlodipine or ARB      Gastric cancer [facility])    Being evaluated for possible at least jejunostomy and possible surgery.  We will complete preop evaluation when studies are completed      RESOLVED: PAD  (peripheral artery disease) (HCC) (Chronic)   Preop cardiovascular exam   Relevant Orders   EKG 12-Lead (Completed)   ECHOCARDIOGRAM COMPLETE   CT CORONARY MORPH W/CTA COR W/SCORE W/CA W/CM &/OR WO/CM   CT CORONARY FRACTIONAL FLOW RESERVE DATA PREP   CT CORONARY FRACTIONAL FLOW RESERVE FLUID ANALYSIS   Basic metabolic panel      Current medicines are reviewed at length with the patient today. (+/- concerns) n/s The following changes have been made: n/s  Patient Instructions  NO MEDICATION CHANGES EXCEPT TAKE METOPROLOL 50 MG ONE HOUR PRIOR TO CORONARY CTA   SCHEDULE AT [place] [address] Your physician has requested that you have an echocardiogram. Echocardiography is a painless test that uses sound waves to create images of your heart. It provides your doctor with information about the size and shape of your heart and how well your heart's chambers and valves are working. This procedure takes approximately one hour. There are no restrictions for this procedure.    SCHEDULE AT HOSPITAL Your physician has requested that you have cardiac CT. Cardiac computed tomography (CT) is a painless test that uses an x-ray machine to take clear, detailed pictures of your heart. For further information please visit [facility]. Please follow instruction sheet as given.   PLEASE PICK UP MEDICATION - LOPRESSOR , AND HAVE LAB WORK BMP AT LEAST ONE HOUR PRIOR TO THE  CORONARY CTA.    Your physician recommends that you schedule a follow-up appointment in [place].               INSTRUCTIONS FOR  CORONARY CTA    Please arrive at the [facility]  main entrance of [facility] at (30-45 minutes prior to test start time)  [facility] [address] [place], [place] [address] [phone]  Proceed to the [facility] Radiology Department (First Floor).  Please follow these instructions carefully (unless otherwise  directed):  PLEASE HAVE LABS - BMP  AT LEAST ONE WEEK PRIOR TO TEST  On the Night Before the Test: . Drink plenty of water. . Do not consume any caffeinated/decaffeinated beverages or chocolate 12 hours prior to your test. . Do not take any antihistamines 12 hours prior to your test.    On the Day of the Test: . Drink plenty of water. Do not drink any water within one hour of the test. . Do not eat any food 4 hours prior to the test. . You may take your regular medications prior to the test. . IF NOT ON A BETA BLOCKER - Take 50 mg of lopressor (metoprolol) one hour before the test.   After the Test: . Drink plenty of water. . After receiving IV contrast, you may experience a mild flushed feeling. This is normal. . On occasion, you may experience a mild rash up to 24 hours after the test. This is not dangerous. If this occurs, you can take Benadryl 25 mg and increase your fluid intake. . If you experience trouble breathing, this can be serious. If it is severe call 911 IMMEDIATELY. If it is mild, please call our office.          Studies Ordered:   Orders Placed This Encounter  Procedures  . CT CORONARY MORPH W/CTA COR W/SCORE W/CA W/CM &/OR WO/CM  . CT CORONARY FRACTIONAL FLOW RESERVE DATA PREP  . CT CORONARY FRACTIONAL FLOW RESERVE FLUID ANALYSIS  . Basic metabolic panel  . EKG 12-Lead  . ECHOCARDIOGRAM COMPLETE      Glenetta Hew, M.D., M.S. Interventional Cardiologist   Pager # [phone] Phone # [phone] [address] [place], [place] [address]

## 2017-06-12 NOTE — Patient Instructions (Signed)
NO MEDICATION CHANGES EXCEPT TAKE METOPROLOL 50 MG ONE HOUR PRIOR TO CORONARY CTA   SCHEDULE AT [place] [facility] Your physician has requested that you have an echocardiogram. Echocardiography is a painless test that uses sound waves to create images of your heart. It provides your doctor with information about the size and shape of your heart and how well your heart's chambers and valves are working. This procedure takes approximately one hour. There are no restrictions for this procedure.    SCHEDULE AT HOSPITAL Your physician has requested that you have cardiac CT. Cardiac computed tomography (CT) is a painless test that uses an x-ray machine to take clear, detailed pictures of your heart. For further information please visit [email or web address]. Please follow instruction sheet as given.   PLEASE PICK UP MEDICATION - LOPRESSOR , AND HAVE LAB WORK BMP AT LEAST ONE HOUR PRIOR TO THE  CORONARY CTA.    Your physician recommends that you schedule a follow-up appointment in [place].               INSTRUCTIONS FOR  CORONARY CTA    Please arrive at the [facility] main entrance of [facility] at (30-45 minutes prior to test start time)  [facility] [place], [place] [address] [phone]  Proceed to the [facility] Radiology Department (First Floor).  Please follow these instructions carefully (unless otherwise directed):  PLEASE HAVE LABS - BMP  AT LEAST ONE WEEK PRIOR TO TEST  On the Night Before the Test: . Drink plenty of water. . Do not consume any caffeinated/decaffeinated beverages or chocolate 12 hours prior to your test. . Do not take any antihistamines 12 hours prior to your test.    On the Day of the Test: . Drink plenty of water. Do not drink any water within one hour of the test. . Do not eat any food 4 hours prior to the test. . You may take your regular medications prior to the  test. . IF NOT ON A BETA BLOCKER - Take 50 mg of lopressor (metoprolol) one hour before the test.   After the Test: . Drink plenty of water. . After receiving IV contrast, you may experience a mild flushed feeling. This is normal. . On occasion, you may experience a mild rash up to 24 hours after the test. This is not dangerous. If this occurs, you can take Benadryl 25 mg and increase your fluid intake. . If you experience trouble breathing, this can be serious. If it is severe call 911 IMMEDIATELY. If it is mild, please call our office.

## 2017-06-13 ENCOUNTER — Encounter: Payer: Self-pay | Admitting: Cardiology

## 2017-06-13 NOTE — Assessment & Plan Note (Signed)
Quite hypertensive today.  He is not on any medications.  We will need to reassess on follow-up.  Beta-blocker bisoprolol.  But could use amlodipine or ARB

## 2017-06-13 NOTE — Assessment & Plan Note (Signed)
Being evaluated for possible at least jejunostomy and possible surgery.  We will complete preop evaluation when studies are completed

## 2017-06-13 NOTE — Assessment & Plan Note (Signed)
Progressively worsening to the point where he can barely walk across the room.. Likely related to his COPD, cannot exclude coexisting coronary disease and even possible CHF.  Plan: 2D echocardiogram and coronary CT angiogram/CT FFR

## 2017-06-13 NOTE — Assessment & Plan Note (Signed)
Clearly, the majority of his dyspnea is related to COPD, but it now also getting worse. He is being followed by Dr. Lenna Gilford from pulmonary medicine.  As part of our evaluation --  2D echocardiogram will help assess for evidence of any cor pulmonale.

## 2017-06-13 NOTE — Assessment & Plan Note (Signed)
He clearly has CAD risk factors with severe PAD and long-term smoking.  Now with significant exertional dyspnea and chest pain.  Chest pain sounds relatively atypical and may very well be related to his coughing, however we need to exclude ischemic CAD as he may have upcoming abdominal surgery which is higher risk than his femorofemoral bypass surgery.  With a severe aortoiliac disease, I do not want to miss any upstream proximal vessel CAD and therefore would not want to run the risk of the potential false negative nuclear stress test.  Plan: Coronary CT angiogram with possible CT FFR.

## 2017-06-13 NOTE — Assessment & Plan Note (Signed)
Smoking cessation instruction/counseling given:  counseled patient on the dangers of tobacco use, advised patient to stop smoking, and reviewed strategies to maximize success 

## 2017-06-13 NOTE — Assessment & Plan Note (Signed)
Severe aortoiliac disease.  Need aggressive risk modification including lipid management, blood pressure control, and smoking cessation.  He does not have diabetes  Plan: He is on Plavix and statin.  I will increase his statin dose to 40 mg (or even 80mg ) daily on return visit. --We will need to check his lipids.  As PAD is a CAD Risk Equivalent, will check coronary CT angiogram to exclude significant native CAD with potential upcoming surgery and chemotherapy.

## 2017-06-15 ENCOUNTER — Other Ambulatory Visit (HOSPITAL_COMMUNITY): Payer: BLUE CROSS/BLUE SHIELD

## 2017-06-15 ENCOUNTER — Telehealth: Payer: Self-pay | Admitting: Hematology

## 2017-06-15 NOTE — Progress Notes (Signed)
[place]  [phone] [phone]) [phone]  Clinic Follow Up Note   Patient Care Team: Patient, No Pcp Per as PCP - General (General Practice) [name], MD as Consulting Physician (Pulmonary Disease) [name], MD as Consulting Physician (Hematology) [name], MD as Attending Physician (Gastroenterology) [name], MD as Consulting Physician (General Surgery)   Date of Service: 06/19/2017   CHIEF COMPLAINTS:  Follow up gastric Adenocarcinoma   Oncology History   Cancer Staging Gastric cancer Bobby [facility]dwell) Staging form: Stomach, AJCC 8th Edition - Clinical stage from 05/24/2017: Stage I (cT2, cN0, cM0) - Signed by [name], MD on 06/19/2017       Gastric cancer ([facility]obby [facility])   05/24/2017 Procedure    EGD by Dr. [name] 05/24/17 IMPRESSION Ulcerated mass like region in the distal stomach (4cm across), extensively biopsied. This appears neoplastic. - Abnormal mucosa throughout stomach otherwise (mild-moderate gastritis), also extensively biopsied.      05/24/2017 Initial Biopsy    Diagnosis 05/24/17  1. Stomach, biopsy, mass distal - ADENOCARCINOMA, SEE COMMENT. - CHRONIC ACTIVE GASTRITIS WITH INTESTINAL METAPLASIA. 2. 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 - NO DYSPLASIA OR MALIGNANCY      06/04/2017 Initial Diagnosis    Gastric cancer (Bobby [facility])      06/07/2017 Procedure    EUS by Dr. [name] on 06/07/17  IMPRESSION:  5cm across, partially circumferential uT2N0 gastric adenocarcinoma laying in the distal stomach 3-4cm proximal to the pylorus.        HISTORY OF PRESENTING ILLNESS: 06/05/17  Bobby Caldwell 61 y.o. male is here because of newly diagnosed Gastric Cancer. He was referred by Dr. [name]. He presents to the clinic today accompanied by an albanian interpretor and then helped by video interpretor [phone] to help translate his visit.   In the  past he was diagnosed with COPD controlled with inhalers. He has smoked for several years and is down to smoking 10 cigars a day. He is actively trying to quit.   Today he notes he is upset about his diagnosis and he recently had bypass surgery on his bilateral legs in 04/2017 and feels fatigued from this. He met with Dr. [name] today and he was told he will need chemo and surgery. He is willing to go with whatever is recommended.  He notes this started with issues with both legs feeling weak and shaking. This occurred for 2-3 months. He had bypass surgery with Dr. Bridgett Larsson. He does not walk much so he has not tested how well he can walk much. The 03/2017 CT scan before his surgery showed thickening in his stomach which is why he was referred to Dr. [name]. Patient notes his stomach issues has been ongoing, but recently has burning sensation below umbilical area since endoscopy. This was only mildly presents prior to procedure. His appetite has not changed much. He doesn't dare to eat much as he is afraid to vomit. He has felt this way for th past 6 months. He currently loss 14 pounds in the past 6 months. He does not go out much anymore, he mostly stays in his house. He is able to do ALDs himself, his wife helps out around the house and cares for him. He has 4 children, 37-21yo. His wife does not work. He does not find himself feeling up to working anymore, he has no energy to do anything.  He notes pain in his chest from his COPD. He uses  inhalers 3 times a day. He would like to do the surgery alone but if chemo is needed he is fine with proceeding with treatment. He reports his body shakes when he gets blood drawn. He is willing to try oral iron in the meantime. He would like to be seen by social worker to help him our with finances as he is not able to work. He feels he often has to break due to sever fatigue and weakness.     CURRENT THERAPY: pending neoadjuvant FOLFOX   INTERVAL HISTORY:  [name] is  here for a follow up. He did not come with his interrater today. He presents to the clinic today noting he missed his first CT CAP. He needs to reschedule. He met with Dr. Ellyn Hack who would like to do a ECHO on 12//18 and heart CT on 12/28. He is currently smoking 10-24 cigarettes. He is trying to quit still. He is still taking his oral iron and he is overall doing well. He has some confusion and some misunderstanding of his schedule. We reviewed in great detail.    MEDICAL HISTORY:  Past Medical History:  Diagnosis Date  . COPD (chronic obstructive pulmonary disease) ([place])   . Dyspnea   . PAD (peripheral artery disease) ([place])   . Stomach cancer (Bobby [facility])   . Tobacco abuse   . Weight loss     SURGICAL HISTORY: Past Surgical History:  Procedure Laterality Date  . ABDOMINAL AORTOGRAM W/LOWER EXTREMITY N/A 05/03/2017   Procedure: ABDOMINAL AORTOGRAM W/LOWER EXTREMITY;  Surgeon: Conrad Hempstead, MD;  Location: [facility];  Service: Cardiovascular;  Laterality: N/A;  . ESOPHAGOGASTRODUODENOSCOPY     05/24/17  . ESOPHAGOGASTRODUODENOSCOPY (EGD) WITH PROPOFOL N/A 05/24/2017   Procedure: ESOPHAGOGASTRODUODENOSCOPY (EGD) WITH PROPOFOL;  Surgeon: [name], MD;  Location: [facility];  Service: Endoscopy;  Laterality: N/A;  . EUS N/A 06/07/2017   Procedure: UPPER ENDOSCOPIC ULTRASOUND (EUS) RADIAL;  Surgeon: [name], MD;  Location: [facility];  Service: Endoscopy;  Laterality: N/A;  . FEMORAL-FEMORAL BYPASS GRAFT Bilateral 05/08/2017   Procedure: LEFT TO RIGHT BYPASS GRAFT FEMORAL-FEMORAL ARTERY;  Surgeon: Conrad Santa Susana, MD;  Location: [facility];  Service: Vascular;  Laterality: Bilateral;  . PERIPHERAL VASCULAR INTERVENTION Left 05/03/2017   Procedure: PERIPHERAL VASCULAR INTERVENTION;  Surgeon: Conrad Val Verde, MD;  Location: [facility];  Service: Cardiovascular;  Laterality: Left;  common iliac    SOCIAL HISTORY: Social History   Socioeconomic History  . Marital  status: Married    Spouse name: Not on file  . Number of children: 4  . Years of education: Not on file  . Highest education level: Not on file  Social Needs  . Financial resource strain: Not on file  . Food insecurity - worry: Not on file  . Food insecurity - inability: Not on file  . Transportation needs - medical: Not on file  . Transportation needs - non-medical: Not on file  Occupational History  . Not on file  Tobacco Use  . Smoking status: Current Every Day Smoker    Packs/day: 1.00    Years: 50.00    Pack years: 50.00    Types: Cigarettes  . Smokeless tobacco: Never Used  . Tobacco comment: 10 cigarettes per day  Substance and Sexual Activity  . Alcohol use: No    Alcohol/week: 0.0 oz  . Drug use: No  . Sexual activity: Yes  Other Topics Concern  . Not on file  Social History Narrative  . Not on file    FAMILY HISTORY: Family History  Family history unknown: Yes    ALLERGIES:  has No Known Allergies.  MEDICATIONS:  Current Outpatient Medications  Medication Sig Dispense Refill  . Albuterol Sulfate (PROAIR RESPICLICK) 536 (90 Base) MCG/ACT AEPB Inhale 2 puffs into the lungs every 6 (six) hours as needed. (Patient taking differently: Inhale 2 puffs into the lungs every 6 (six) hours as needed (shortness of breath). ) 1 each 6  . atorvastatin (LIPITOR) 10 MG tablet Take 1 tablet (10 mg total) by mouth daily. 30 tablet 2  . budesonide-formoterol (SYMBICORT) 160-4.5 MCG/ACT inhaler Inhale 2 puffs into the lungs 2 (two) times daily. 1 Inhaler 6  . clopidogrel (PLAVIX) 75 MG tablet Take 1 tablet (75 mg total) by mouth daily. 30 tablet 11  . metoprolol tartrate (LOPRESSOR) 50 MG tablet Take 1 tablet (50 mg total) by mouth once for 1 dose. TAKE ONE HOUR PRIOR TO  SCHEDULE CARDAIC TEST 1 tablet 0  . oxyCODONE-acetaminophen (ROXICET) 5-325 MG tablet Take 1 tablet by mouth every 6 (six) hours as needed for severe pain. 20 tablet 0  . prochlorperazine (COMPAZINE) 10 MG  tablet Take 1 tablet (10 mg total) by mouth every 6 (six) hours as needed for nausea or vomiting. 30 tablet 0  . Tiotropium Bromide Monohydrate (SPIRIVA RESPIMAT) 2.5 MCG/ACT AERS Inhale 2 puffs into the lungs daily. 1 Inhaler 6   No current facility-administered medications for this visit.     REVIEW OF SYSTEMS:  Constitutional: Denies fevers, chills or abnormal night sweats (+) low appetite/anorexia  Eyes: Denies blurriness of vision, double vision or watery eyes Ears, nose, mouth, throat, and face: Denies mucositis or sore throat Respiratory: (+) chest pain from COPD Cardiovascular: Denies palpitation or lower extremity swelling   Gastrointestinal:  Denies nausea, heartburn or change in bowel habits (+) lower abdominal pain  Skin: Denies abnormal skin rashes Lymphatics: Denies new lymphadenopathy or easy bruising Neurological:Denies numbness, tingling or new weaknesses MSK: (+) bilateral leg weakness/slight pain  Behavioral/Psych: Mood is stable, no new changes  All other systems were reviewed with the patient and are negative.   PHYSICAL EXAMINATION:  ECOG PERFORMANCE STATUS: 2 - Symptomatic, <50% confined to bed  Vitals:   06/19/17 1147  BP: (!) 151/93  Pulse: 75  Resp: (!) 98  Temp: 97.7 F (36.5 C)  SpO2: 95%   Filed Weights   06/19/17 1147  Weight: 112 lb 11.2 oz (51.1 kg)    GENERAL:alert, no distress and comfortable SKIN: skin color, texture, turgor are normal, no rashes or significant lesions EYES: normal, conjunctiva are pink and non-injected, sclera clear OROPHARYNX:no exudate, no erythema and lips, buccal mucosa, and tongue normal  NECK: supple, thyroid normal size, non-tender, without nodularity LYMPH:  no palpable lymphadenopathy in the cervical, axillary or inguinal LUNGS: clear to auscultation and percussion with normal breathing effort (+) decreased breast sounds bilaterally.   HEART: regular rate & rhythm and no murmurs and no lower extremity  edema ABDOMEN:abdomen soft, non-tender and normal bowel sounds  Musculoskeletal:no cyanosis of digits and no clubbing  PSYCH: alert & oriented x 3 with fluent speech NEURO: no focal motor/sensory deficits  LABORATORY DATA:  I have reviewed the data as listed CBC Latest Ref Rng & Units 06/19/2017 05/09/2017 05/08/2017  WBC 4.0 - 10.3 10e3/uL 8.3 14.5(H) 8.1  Hemoglobin 13.0 - 17.1 g/dL 15.9 11.9(L) 13.4  Hematocrit 38.4 - 49.9 % 47.1 35.1(L) 39.4  Platelets 140 -  400 10e3/uL 210 255 212     CMP Latest Ref Rng & Units 06/19/2017 05/09/2017 05/08/2017  Glucose 70 - 140 mg/dl 92 130(H) -  BUN 7.0 - 26.0 mg/dL 6.6(L) 9 -  Creatinine 0.7 - 1.3 mg/dL 0.7 0.50(L) 0.56(L)  Sodium 136 - 145 mEq/L 138 136 -  Potassium 3.5 - 5.1 mEq/L 3.7 3.9 -  Chloride 101 - 111 mmol/L - 102 -  CO2 22 - 29 mEq/L 26 28 -  Calcium 8.4 - 10.4 mg/dL 9.4 8.2(L) -  Total Protein 6.4 - 8.3 g/dL 7.5 - -  Total Bilirubin 0.20 - 1.20 mg/dL 0.52 - -  Alkaline Phos 40 - 150 Caldwell/L 116 - -  AST 5 - 34 Caldwell/L 16 - -  ALT 0 - 55 Caldwell/L 15 - -    Baseline CEA at 3.38 on 06/19/17   PATHOLOGY  Diagnosis 05/24/17  1. Stomach, biopsy, mass distal - ADENOCARCINOMA, SEE COMMENT. - CHRONIC ACTIVE GASTRITIS WITH INTESTINAL METAPLASIA. 2. 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 - NO DYSPLASIA OR MALIGNANCY. Microscopic Comment 1. The fragments are superficial and therefore assessment of invasion is hampered. Dr. Saralyn Pilar has reviewed the case. The case was called to Dr. [name] on 05/25/2017. 2. A Warthin-Starry stain is performed to determine the possibility of the presence of Helicobacter pylori. The Warthin-Starry stain is negative for organisms of Helicobacter pylori   PROCEDURES  EUS by Dr. [name] on 06/07/17  IMPRESSION:  5cm across, partially circumferential uT2N0 gastric adenocarcinoma laying in the distal stomach 3-4cm proximal to the  pylorus.    EGD by Dr. [name] 05/24/17 IMPRESSION Ulcerated mass like region in the distal stomach (4cm across), extensively biopsied. This appears neoplastic. - Abnormal mucosa throughout stomach otherwise (mild-moderate gastritis), also extensively biopsied.   RADIOGRAPHIC STUDIES: I have personally reviewed the radiological images as listed and agreed with the findings in the report. No results found.   CT CAP 04/02/17 IMPRESSION: 1. Aortic atherosclerosis with fusiform ectasia of the infrarenal abdominal aorta which measures up to 2.6 x 2.3 cm, and contains a large burden of mural thrombus. 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 2. Today's study was not a CTA examination, which limits assessment of vascular abnormalities. Additionally, secondary to extensive patient respiratory motion, accurate assessment of small vessels is limited. With these limitations in mind, there is potential stenosis of both the origin of the celiac axis and the origin of the inferior mesenteric artery. Given the patient's history of intermittent abdominal pain, further evaluation with CTA of the abdomen and pelvis should be considered if clinically appropriate, and if the patient can be counseled to adequately hold his breath during the examination to allow for optimal image quality. 3. No other acute findings are noted in the chest, abdomen or pelvis to account for the patient's symptoms. 4. Diffuse bronchial wall thickening with moderate centrilobular and paraseptal emphysema; imaging findings suggestive of underlying COPD. 5. Multiple small pulmonary nodules measuring up to 6 mm, stable compared to prior examinations dating back take 02/22/2015, considered benign. No future follow-up needed. This recommendation follows the consensus statement: Guidelines for Management of Incidental Pulmonary Nodules  Detected on CT Images: From the Fleischner Society 2017; Radiology 2017; [id number]. 6. Additional incidental findings, as above. Aortic Atherosclerosis (ICD10-I70.0) and Emphysema (ICD10-J43.9).   ASSESSMENT & PLAN:  Adoni Greenough is a 61 y.o. [place] male  with a history of heavy smoking and COPD, peripheral vascular disease, presents with fatigue, anorexia, early satiety and weight loss   1. Gastric Adenocarcinoma, uT2N0M0, stage I -I reviewed his EGD findings and biopsy pathology results that show gastric adenocarcinoma. He had CT chest, abdomen and pelvis with contrast for his peripheral vascular disease on 04/02/2017, which shows no sign of metastasis at that time.  -I will repeat staging scan to rule out new distant metastasis since last scan.  -His EUS showed a T2N0 lesion in distal gastric body -Due to his intensive peripheral vascular disease and recent bypass surgery, poor nutrition status, Dr. [name] not feel he is a great surgical candidate at this point, she recommended him to improve his nutrition status and to consider neoadjuvant chemotherapy first.  Giving the T2 lesion, new adjuvant chemotherapy is reasonable and I agree with Dr. [name]. -The periop chemo FLOT4 (5-fu, oxaliplatin, and docetaxel) has showed superior outcome for gastric cancer. However, due to his poor nutrition status, comorbidities, and poor performance status, he may not be able to tolerate such intensive chemotherapy.  I will consider FOLFOX as the first cycle of neoadjuvant chemo, his nutrition status improved and he tolerates well, will consider change to FLOT4 for 2 more cycles before surgery  -He is still getting further cardiac workup with Dr. Ellyn Hack. He will have ECHO on 12/18 and Coronary CT on 07/06/17. He has not been cleared for PAC. I have talked to Dr. Ellyn Hack. Due to cardiac vasospasm from 5-FU, Dr. Selena Batten recommends him to complete cardiac workup before he starts chemotherapy, and he will try to  complete his cardiac workup as soon as possible. --He missed his previous CT CAP, will reschedule to be [facility] within one week.  -He has his chemo class and follow up with dietician on 12/17.  -He met with [facility] today to go over his schedule in detail.  -Labs reviewed, anemia resolved, CBC and CMP within normal limits. Iron study overall normal. Baseline CEA at 3.38. -Plan to start his new adjuvant chemotherapy FOLFOX as soon as he is cleared by his cardiologist Dr. Ellyn Hack    2. Anorexia, nausea and weight loss  -He has loss 14 pounds over past 6 months  -I previously  set up nutrition consult -I called in compazine for his nausea  -I will discuss J-tube placement with Dr. [name]   -He will see dietician on 12/17.   3. COPD, Smoking Cessation  -He reduced from 50 cigars a day to 10 cigars a day. He is actively trying to quit.  -I discussed his chest pain is due to his COPD and smoking.   4. Financial support -I will refer him to our social worker for help with financial help, he is no longer able to work and would like apply for disability.     PLAN:  -Please schedule his CT CAP w contrast ASAP (within one week) -Chemo class and nutritional consult on 1217 -Then to start a new adjuvant chemotherapy FOLFOX as soon as he is cleared by his cardiologist Dr. Ellyn Hack.  He has cardiac workup for this month.     Orders Placed This Encounter  Procedures  . IR Fluoro Guide CV Line Left    Indicate type of CVC ordering    Standing Status:   Future    Standing Expiration Date:   08/20/2018    Order Specific Question:   Reason for exam:    Answer:   for chemo  Order Specific Question:   Preferred Imaging Location?    Answer:   [facility]    All questions were answered. The patient knows to call the clinic with any problems, questions or concerns. I spent 25 minutes counseling the patient face to face. The total time spent in the appointment was 30 minutes and more  than 50% was on counseling.     [name], MD 06/19/2017   This document serves as a record of services personally performed by [name], MD. It was created on her behalf by Joslyn Devon, a trained medical scribe. The creation of this record is based on the scribe's personal observations and the provider's statements to them.    I have reviewed the above documentation for accuracy and completeness, and I agree with the above.

## 2017-06-15 NOTE — Telephone Encounter (Signed)
Moved 12/11 ched class to 12/17. Patient needs one on one class due to need for interpreter. Appointment moved to 12/17 and combined with nutrition appointment.   Spoke with patient via [employer] [phone]) re change and confirmed both appointments on 12/11 and 12/17.

## 2017-06-19 ENCOUNTER — Encounter: Payer: Self-pay | Admitting: Hematology

## 2017-06-19 ENCOUNTER — Other Ambulatory Visit: Payer: BLUE CROSS/BLUE SHIELD

## 2017-06-19 ENCOUNTER — Telehealth: Payer: Self-pay | Admitting: Hematology

## 2017-06-19 ENCOUNTER — Ambulatory Visit (HOSPITAL_BASED_OUTPATIENT_CLINIC_OR_DEPARTMENT_OTHER): Payer: BLUE CROSS/BLUE SHIELD | Admitting: Hematology

## 2017-06-19 ENCOUNTER — Other Ambulatory Visit (HOSPITAL_BASED_OUTPATIENT_CLINIC_OR_DEPARTMENT_OTHER): Payer: BLUE CROSS/BLUE SHIELD

## 2017-06-19 VITALS — BP 151/93 | HR 75 | Temp 97.7°F | Resp 98 | Ht 66.0 in | Wt 112.7 lb

## 2017-06-19 DIAGNOSIS — I10 Essential (primary) hypertension: Secondary | ICD-10-CM

## 2017-06-19 DIAGNOSIS — Z720 Tobacco use: Secondary | ICD-10-CM | POA: Diagnosis not present

## 2017-06-19 DIAGNOSIS — R630 Anorexia: Secondary | ICD-10-CM | POA: Diagnosis not present

## 2017-06-19 DIAGNOSIS — C169 Malignant neoplasm of stomach, unspecified: Secondary | ICD-10-CM

## 2017-06-19 DIAGNOSIS — J449 Chronic obstructive pulmonary disease, unspecified: Secondary | ICD-10-CM

## 2017-06-19 DIAGNOSIS — J431 Panlobular emphysema: Secondary | ICD-10-CM

## 2017-06-19 DIAGNOSIS — R634 Abnormal weight loss: Secondary | ICD-10-CM

## 2017-06-19 DIAGNOSIS — R110 Nausea: Secondary | ICD-10-CM

## 2017-06-19 DIAGNOSIS — C162 Malignant neoplasm of body of stomach: Secondary | ICD-10-CM

## 2017-06-19 LAB — CBC WITH DIFFERENTIAL/PLATELET
BASO%: 1.1 % (ref 0.0–2.0)
BASOS ABS: 0.1 10*3/uL (ref 0.0–0.1)
EOS%: 1.5 % (ref 0.0–7.0)
Eosinophils Absolute: 0.1 10*3/uL (ref 0.0–0.5)
HEMATOCRIT: 47.1 % (ref 38.4–49.9)
HGB: 15.9 g/dL (ref 13.0–17.1)
LYMPH#: 1.6 10*3/uL (ref 0.9–3.3)
LYMPH%: 19.1 % (ref 14.0–49.0)
MCH: 29.7 pg (ref 27.2–33.4)
MCHC: 33.7 g/dL (ref 32.0–36.0)
MCV: 88.1 fL (ref 79.3–98.0)
MONO#: 0.6 10*3/uL (ref 0.1–0.9)
MONO%: 6.9 % (ref 0.0–14.0)
NEUT#: 5.9 10*3/uL (ref 1.5–6.5)
NEUT%: 71.4 % (ref 39.0–75.0)
PLATELETS: 210 10*3/uL (ref 140–400)
RBC: 5.34 10*6/uL (ref 4.20–5.82)
RDW: 13 % (ref 11.0–14.6)
WBC: 8.3 10*3/uL (ref 4.0–10.3)

## 2017-06-19 LAB — FERRITIN: FERRITIN: 50 ng/mL (ref 22–316)

## 2017-06-19 LAB — COMPREHENSIVE METABOLIC PANEL
ALT: 15 U/L (ref 0–55)
AST: 16 U/L (ref 5–34)
Albumin: 3.8 g/dL (ref 3.5–5.0)
Alkaline Phosphatase: 116 U/L (ref 40–150)
Anion Gap: 10 mEq/L (ref 3–11)
BUN: 6.6 mg/dL — ABNORMAL LOW (ref 7.0–26.0)
CO2: 26 meq/L (ref 22–29)
Calcium: 9.4 mg/dL (ref 8.4–10.4)
Chloride: 102 mEq/L (ref 98–109)
Creatinine: 0.7 mg/dL (ref 0.7–1.3)
Glucose: 92 mg/dl (ref 70–140)
POTASSIUM: 3.7 meq/L (ref 3.5–5.1)
SODIUM: 138 meq/L (ref 136–145)
TOTAL PROTEIN: 7.5 g/dL (ref 6.4–8.3)
Total Bilirubin: 0.52 mg/dL (ref 0.20–1.20)

## 2017-06-19 LAB — IRON AND TIBC
%SAT: 17 % — ABNORMAL LOW (ref 20–55)
Iron: 57 ug/dL (ref 42–163)
TIBC: 334 ug/dL (ref 202–409)
UIBC: 277 ug/dL (ref 117–376)

## 2017-06-19 LAB — CEA (IN HOUSE-CHCC): CEA (CHCC-In House): 3.38 ng/mL (ref 0.00–5.00)

## 2017-06-19 NOTE — Progress Notes (Signed)
START ON PATHWAY REGIMEN - Gastroesophageal     A cycle is every 14 days:     Oxaliplatin      Leucovorin      5-Fluorouracil      5-Fluorouracil   **Always confirm dose/schedule in your pharmacy ordering system**    Patient Characteristics: Gastric, Adenocarcinoma, Preoperative or Nonsurgical Candidate (Clinical Staging), cT1 - cT2, cN0, Nonsurgical Candidate, Not a Radiation Candidate, HER2 Negative / Unknown Histology: Adenocarcinoma Disease Classification: Gastric Therapeutic Status: Preoperative or Nonsurgical Candidate (Clinical Staging) AJCC N Category: cN0 AJCC M Category: cM0 AJCC 8 Stage Grouping: I AJCC T Category: cT2 Patient Characteristics: Nonsurgical Candidate Patient Characteristics: Not a Radiation Candidate HER2 Status: Awaiting Test Results Intent of Therapy: Curative Intent, Discussed with Patient

## 2017-06-19 NOTE — Telephone Encounter (Signed)
Gave avs and calendar however waiting for 12/19 and 1/2

## 2017-06-19 NOTE — Progress Notes (Signed)
  Oncology Nurse Navigator Documentation  Navigator Location: [facility] (06/19/17 1230)   )Navigator Encounter Type: Follow-up Appt (06/19/17 1230)                     Patient Visit Type: MedOnc (06/19/17 1230) Treatment Phase: Pre-Tx/Tx Discussion (06/19/17 1230) Barriers/Navigation Needs: Coordination of Care (06/19/17 1230)   Interventions: Coordination of Care;Education;Psycho-social support (06/19/17 1230)   Coordination of Care: Appts (06/19/17 1230) Education Method: Verbal (06/19/17 1230)   Patient is having difficulty understanding appointments and locations. I called and scheduled patient's CT scan for 06/22/17 and reviewed times, location and instructions for NPO and contrast. I also sat with patient while he was at scheduling. Patient declined [employer]. Patient has appointment with Dr. Burr Medico on 06/27/17 and labs. Dr. Burr Medico instructed me to hold scheduling PICC and chemo appointments until further instructed.    Acuity: Level 2 (06/19/17 1230)   Acuity Level 2: Ongoing guidance and education throughout treatment as needed;Assistance expediting appointments (06/19/17 1230)     Time Spent with Patient: 30 (06/19/17 1230)

## 2017-06-20 ENCOUNTER — Telehealth: Payer: Self-pay | Admitting: *Deleted

## 2017-06-20 DIAGNOSIS — I208 Other forms of angina pectoris: Secondary | ICD-10-CM

## 2017-06-20 DIAGNOSIS — R0609 Other forms of dyspnea: Principal | ICD-10-CM

## 2017-06-20 DIAGNOSIS — R634 Abnormal weight loss: Secondary | ICD-10-CM

## 2017-06-20 DIAGNOSIS — Z01810 Encounter for preprocedural cardiovascular examination: Secondary | ICD-10-CM

## 2017-06-20 NOTE — Telephone Encounter (Signed)
PER DR HARDING , WILL SCHEDULE LEXISCAN  AS SOON AS POSSIBLE INSTEAD OF CORONARY CTA  UNABLE TO MOVE CORONARY CTA DATE. WILL CANCEL CTA. NOTIFY PATIENT.

## 2017-06-20 NOTE — Telephone Encounter (Signed)
-----   Message from Leonie Man, MD sent at 06/19/2017 12:28 PM EST ----- I will try to get his studies done as quickly as possible - this snow stuff will cause a back-log in studies.  I may have to settle with a Myoview ST instead of Coronary CTA, but cannot make anything happen until offices open back up.  I would prefer not to give a medication that would cause coronary spasm until this portion of the evaluation is complete.  Bobby Caldwell ----- Message ----- From: [name], MD Sent: 06/18/2017  11:17 PM To: [name], MD, Leonie Man, MD  Dorris Fetch, that's OK. I reviewed his schedule, it looks it will take a month to complete his cardiac work up. I think we should start him on chemo in a few weeks, I can arrange a PICC line if you can not do port until you get his cardiac clearance.  Dr. Ellyn Hack, I plan to start him on chemo FOLFOX. One of the chemo drug 5-FU may cause cardiac artery spasm, will you be OK for me to start before he completes cardiac workup? If not, could you possible get his cardiac work up done ASAP, hopefully in a few weeks?   Thanks much,  Krista Blue  ----- Message ----- From: [name], MD Sent: 06/18/2017  10:46 AM To: [name], MD, [name], MD  I haven't operated on this guy yet due to note I got from cards.   fb  ----- Message ----- From: [name], MD Sent: 06/07/2017   4:50 PM To: [name], MD, [name], MD  Thanks much Linna Hoff.  Dorris Fetch, let me know how strongly you want him to get neoadjuvant chemo. Per [facility], both upfront surgery or neoadjuvant chemo are options for T2N0 (it's included in the FLOT4 study), He maybe able to tolerate neoadjuvant chemo better than adjuvant, so I am OK to do neoadjuvant   Krista Blue   ----- Message ----- From: [name], MD Sent: 06/07/2017  11:26 AM To: [name], MD, [name], MD   Just completed EUS. See full report in Epic.    Thanks dj   5cm across, partially circumferential uT2N0 gastric  adenocarcinoma laying in the distal stomach 3-4cm proximal to the pylorus.

## 2017-06-21 DIAGNOSIS — Z0279 Encounter for issue of other medical certificate: Secondary | ICD-10-CM | POA: Diagnosis not present

## 2017-06-21 NOTE — Telephone Encounter (Signed)
SPOKE TO PATIENT AWARE WILL CANCEL CORONARY CTA AND SCHEDULE LEXISCAN INSTEAD PATIENT STATES HE HAS 2 APPOINTMENT TOMORROW.

## 2017-06-22 ENCOUNTER — Ambulatory Visit (HOSPITAL_COMMUNITY)
Admission: RE | Admit: 2017-06-22 | Discharge: 2017-06-22 | Disposition: A | Discharge status: Home / Self Care | Payer: BLUE CROSS/BLUE SHIELD | Source: Ambulatory Visit | Attending: Hematology | Admitting: Hematology

## 2017-06-22 DIAGNOSIS — I700 Atherosclerosis of aorta: Secondary | ICD-10-CM | POA: Diagnosis not present

## 2017-06-22 DIAGNOSIS — J8410 Pulmonary fibrosis, unspecified: Secondary | ICD-10-CM | POA: Insufficient documentation

## 2017-06-22 DIAGNOSIS — J439 Emphysema, unspecified: Secondary | ICD-10-CM | POA: Insufficient documentation

## 2017-06-22 DIAGNOSIS — R918 Other nonspecific abnormal finding of lung field: Secondary | ICD-10-CM | POA: Diagnosis not present

## 2017-06-22 DIAGNOSIS — C162 Malignant neoplasm of body of stomach: Secondary | ICD-10-CM | POA: Diagnosis not present

## 2017-06-22 DIAGNOSIS — I745 Embolism and thrombosis of iliac artery: Secondary | ICD-10-CM | POA: Diagnosis not present

## 2017-06-22 MED ORDER — IOPAMIDOL (ISOVUE-300) INJECTION 61%
100.0000 mL | Freq: Once | INTRAVENOUS | Status: AC | PRN
  Administered 2017-06-22: 100 mL via INTRAVENOUS

## 2017-06-22 MED ORDER — IOPAMIDOL (ISOVUE-300) INJECTION 61%
INTRAVENOUS | Status: AC
  Filled 2017-06-22: qty 100

## 2017-06-24 ENCOUNTER — Telehealth: Payer: Self-pay | Admitting: Hematology

## 2017-06-24 NOTE — Telephone Encounter (Signed)
I called pt and spoke with his daughter who translated the conversation to pt. We reviewed his CT scan findings, which was negative for distant metastasis. I will cancel his appointments with [place] on 12/19, due to his pending cardiology evaluation. I will also cancel his port placement on 12/20 for same reason. He has stopped Plavix 2 days ago for the scheduled port placement, and I asked him to restart today.   I also sent a message to his cardiologist Dr. Ellyn Hack, to see if he is able to clear him for chemo after his stress test on 12/21. If yes, we will schedule his chemo ASAP.   [name]  06/24/2017

## 2017-06-25 ENCOUNTER — Other Ambulatory Visit: Payer: BLUE CROSS/BLUE SHIELD

## 2017-06-25 ENCOUNTER — Ambulatory Visit: Payer: BLUE CROSS/BLUE SHIELD | Admitting: Nutrition

## 2017-06-25 NOTE — Progress Notes (Signed)
61 year old male diagnosed with gastric cancer.  He is a patient of Dr. Burr Medico.  Past medical history includes tobacco, PAD and COPD  Medications include Lipitor, and Compazine.  Labs include glucose 130.  Height: 5 feet 6 inches. Weight: 115.2 pounds. Usual body weight: 150 pounds per patient. BMI: 18.59.  I met with patient, his wife and an interpreter. Patient reports he has early satiety. He is eating very small amounts. He reports sometimes he throws up if he coughs too much. Patient is gaining weight. Dietary recall reveals patient does enjoy many protein foods. Patient denies constipation or diarrhea.  Nutrition diagnosis:  Food and nutrition related knowledge deficit related to gastric cancer and associated treatments as evidenced by no prior need for nutrition related information.  Intervention: I educated patient to consume smaller more frequent meals and snacks with high-calorie, high-protein foods. Recommended patient consume oral nutrition supplements such as Ensure Plus or boost plus.  Provided samples. Questions were answered.  Teach back method used.  Monitoring, evaluation, goals: Patient will tolerate increased calories and protein to promote continued weight gain.  Next visit: To be scheduled as needed.  **Disclaimer: This note was dictated with voice recognition software. Similar sounding words can inadvertently be transcribed and this note may contain transcription errors which may not have been corrected upon publication of note.**

## 2017-06-26 ENCOUNTER — Other Ambulatory Visit: Payer: Self-pay

## 2017-06-26 ENCOUNTER — Encounter (HOSPITAL_COMMUNITY): Payer: Self-pay | Admitting: Radiology

## 2017-06-26 ENCOUNTER — Other Ambulatory Visit (HOSPITAL_COMMUNITY): Payer: BLUE CROSS/BLUE SHIELD

## 2017-06-26 ENCOUNTER — Ambulatory Visit (HOSPITAL_COMMUNITY): Payer: BLUE CROSS/BLUE SHIELD | Attending: Cardiology

## 2017-06-26 DIAGNOSIS — I208 Other forms of angina pectoris: Secondary | ICD-10-CM | POA: Insufficient documentation

## 2017-06-26 DIAGNOSIS — Z01810 Encounter for preprocedural cardiovascular examination: Secondary | ICD-10-CM | POA: Diagnosis not present

## 2017-06-26 DIAGNOSIS — R0609 Other forms of dyspnea: Secondary | ICD-10-CM

## 2017-06-26 HISTORY — PX: TRANSTHORACIC ECHOCARDIOGRAM: SHX275

## 2017-06-26 NOTE — Progress Notes (Signed)
During Echocardiogram, patient reported that he was unable to sleep last night due to coughing and shortness of breath. He only slept for 2 hours. Patient had a difficult time laying flat, due to coughing, wheezing and shortness of breath during exam.

## 2017-06-26 NOTE — Progress Notes (Signed)
Thanks - not unexpected - he has bad COPD & Lung Cancer.  Trying to figure out how much is also potentially CHF related.  [place]

## 2017-06-27 ENCOUNTER — Other Ambulatory Visit: Payer: BLUE CROSS/BLUE SHIELD

## 2017-06-27 ENCOUNTER — Ambulatory Visit: Payer: BLUE CROSS/BLUE SHIELD | Admitting: Nurse Practitioner

## 2017-06-27 ENCOUNTER — Telehealth (HOSPITAL_COMMUNITY): Payer: Self-pay

## 2017-06-27 NOTE — Telephone Encounter (Signed)
Encounter complete. 

## 2017-06-28 ENCOUNTER — Ambulatory Visit (HOSPITAL_COMMUNITY): Payer: BLUE CROSS/BLUE SHIELD

## 2017-06-28 ENCOUNTER — Other Ambulatory Visit (HOSPITAL_COMMUNITY): Payer: BLUE CROSS/BLUE SHIELD

## 2017-06-29 ENCOUNTER — Ambulatory Visit (HOSPITAL_COMMUNITY)
Admission: RE | Admit: 2017-06-29 | Discharge: 2017-06-29 | Disposition: A | Discharge status: Home / Self Care | Payer: BLUE CROSS/BLUE SHIELD | Source: Ambulatory Visit | Attending: Cardiovascular Disease | Admitting: Cardiovascular Disease

## 2017-06-29 DIAGNOSIS — R9439 Abnormal result of other cardiovascular function study: Secondary | ICD-10-CM | POA: Diagnosis not present

## 2017-06-29 DIAGNOSIS — J449 Chronic obstructive pulmonary disease, unspecified: Secondary | ICD-10-CM | POA: Diagnosis not present

## 2017-06-29 DIAGNOSIS — R0609 Other forms of dyspnea: Secondary | ICD-10-CM | POA: Diagnosis not present

## 2017-06-29 DIAGNOSIS — Z681 Body mass index (BMI) 19 or less, adult: Secondary | ICD-10-CM | POA: Insufficient documentation

## 2017-06-29 DIAGNOSIS — R634 Abnormal weight loss: Secondary | ICD-10-CM

## 2017-06-29 DIAGNOSIS — F17210 Nicotine dependence, cigarettes, uncomplicated: Secondary | ICD-10-CM | POA: Insufficient documentation

## 2017-06-29 DIAGNOSIS — I208 Other forms of angina pectoris: Secondary | ICD-10-CM | POA: Insufficient documentation

## 2017-06-29 DIAGNOSIS — C169 Malignant neoplasm of stomach, unspecified: Secondary | ICD-10-CM | POA: Diagnosis not present

## 2017-06-29 DIAGNOSIS — Z01810 Encounter for preprocedural cardiovascular examination: Secondary | ICD-10-CM | POA: Diagnosis not present

## 2017-06-29 LAB — MYOCARDIAL PERFUSION IMAGING
CHL CUP RESTING HR STRESS: 79 {beats}/min
CSEPPHR: 114 {beats}/min
LV dias vol: 53 mL (ref 62–150)
LV sys vol: 27 mL
SDS: 1
SRS: 4
SSS: 5
TID: 0.83

## 2017-06-29 MED ORDER — REGADENOSON 0.4 MG/5ML IV SOLN
0.4000 mg | Freq: Once | INTRAVENOUS | Status: AC
  Administered 2017-06-29: 0.4 mg via INTRAVENOUS

## 2017-06-29 MED ORDER — TECHNETIUM TC 99M TETROFOSMIN IV KIT
8.6000 | PACK | Freq: Once | INTRAVENOUS | Status: AC | PRN
  Administered 2017-06-29: 8.6 via INTRAVENOUS
  Filled 2017-06-29: qty 9

## 2017-06-29 MED ORDER — TECHNETIUM TC 99M TETROFOSMIN IV KIT
26.9000 | PACK | Freq: Once | INTRAVENOUS | Status: AC | PRN
  Administered 2017-06-29: 26.9 via INTRAVENOUS
  Filled 2017-06-29: qty 27

## 2017-07-02 ENCOUNTER — Telehealth: Payer: Self-pay | Admitting: Nurse Practitioner

## 2017-07-02 NOTE — Telephone Encounter (Signed)
I called patient and informed him and his daughter of [facility] placement in IR on 12/27 at [facility], patient to arrive at 10 am at short stay, procedure at 12. NPO at midnight. He will need a driver. He has been off plavix since 12/22 per his daughter, he will remain off until after [facility] placement. I provided directions and address for his daughter. Encouraged to call with questions. She verbalizes understanding of appts and instructions.

## 2017-07-04 ENCOUNTER — Other Ambulatory Visit: Payer: Self-pay | Admitting: Radiology

## 2017-07-05 ENCOUNTER — Telehealth: Payer: Self-pay | Admitting: Hematology

## 2017-07-05 ENCOUNTER — Ambulatory Visit (HOSPITAL_COMMUNITY)
Admission: RE | Admit: 2017-07-05 | Discharge: 2017-07-05 | Disposition: A | Discharge status: Home / Self Care | Payer: BLUE CROSS/BLUE SHIELD | Source: Ambulatory Visit | Attending: Hematology | Admitting: Hematology

## 2017-07-05 ENCOUNTER — Encounter (HOSPITAL_COMMUNITY): Payer: Self-pay

## 2017-07-05 ENCOUNTER — Telehealth: Payer: Self-pay

## 2017-07-05 ENCOUNTER — Other Ambulatory Visit: Payer: Self-pay | Admitting: Hematology

## 2017-07-05 DIAGNOSIS — Z7902 Long term (current) use of antithrombotics/antiplatelets: Secondary | ICD-10-CM | POA: Diagnosis not present

## 2017-07-05 DIAGNOSIS — I700 Atherosclerosis of aorta: Secondary | ICD-10-CM | POA: Diagnosis not present

## 2017-07-05 DIAGNOSIS — I739 Peripheral vascular disease, unspecified: Secondary | ICD-10-CM | POA: Diagnosis not present

## 2017-07-05 DIAGNOSIS — J439 Emphysema, unspecified: Secondary | ICD-10-CM | POA: Insufficient documentation

## 2017-07-05 DIAGNOSIS — Z95820 Peripheral vascular angioplasty status with implants and grafts: Secondary | ICD-10-CM | POA: Insufficient documentation

## 2017-07-05 DIAGNOSIS — Z79899 Other long term (current) drug therapy: Secondary | ICD-10-CM | POA: Diagnosis not present

## 2017-07-05 DIAGNOSIS — F17210 Nicotine dependence, cigarettes, uncomplicated: Secondary | ICD-10-CM | POA: Insufficient documentation

## 2017-07-05 DIAGNOSIS — Z7951 Long term (current) use of inhaled steroids: Secondary | ICD-10-CM | POA: Diagnosis not present

## 2017-07-05 DIAGNOSIS — C169 Malignant neoplasm of stomach, unspecified: Secondary | ICD-10-CM | POA: Diagnosis not present

## 2017-07-05 DIAGNOSIS — C162 Malignant neoplasm of body of stomach: Secondary | ICD-10-CM

## 2017-07-05 HISTORY — PX: IR US GUIDE VASC ACCESS RIGHT: IMG2390

## 2017-07-05 HISTORY — PX: IR FLUORO GUIDE PORT INSERTION RIGHT: IMG5741

## 2017-07-05 LAB — CBC
HEMATOCRIT: 47.9 % (ref 39.0–52.0)
Hemoglobin: 16.3 g/dL (ref 13.0–17.0)
MCH: 29.9 pg (ref 26.0–34.0)
MCHC: 34 g/dL (ref 30.0–36.0)
MCV: 87.7 fL (ref 78.0–100.0)
PLATELETS: 196 10*3/uL (ref 150–400)
RBC: 5.46 MIL/uL (ref 4.22–5.81)
RDW: 12.9 % (ref 11.5–15.5)
WBC: 9 10*3/uL (ref 4.0–10.5)

## 2017-07-05 LAB — BASIC METABOLIC PANEL
Anion gap: 10 (ref 5–15)
BUN: 8 mg/dL (ref 6–20)
CHLORIDE: 98 mmol/L — AB (ref 101–111)
CO2: 30 mmol/L (ref 22–32)
CREATININE: 0.64 mg/dL (ref 0.61–1.24)
Calcium: 8.8 mg/dL — ABNORMAL LOW (ref 8.9–10.3)
GFR calc Af Amer: >60 mL/min (ref >60)
GFR calc non Af Amer: >60 mL/min (ref >60)
GLUCOSE: 102 mg/dL — AB (ref 65–99)
POTASSIUM: 3.7 mmol/L (ref 3.5–5.1)
Sodium: 138 mmol/L (ref 135–145)

## 2017-07-05 LAB — APTT: APTT: 29 s (ref 24–36)

## 2017-07-05 LAB — PROTIME-INR
INR: 0.93
Prothrombin Time: 12.4 seconds (ref 11.4–15.2)

## 2017-07-05 MED ORDER — LIDOCAINE-EPINEPHRINE (PF) 2 %-1:200000 IJ SOLN
INTRAMUSCULAR | Status: AC | PRN
  Administered 2017-07-05: 10 mL

## 2017-07-05 MED ORDER — MIDAZOLAM HCL 2 MG/2ML IJ SOLN
INTRAMUSCULAR | Status: AC | PRN
  Administered 2017-07-05: 1 mg via INTRAVENOUS
  Administered 2017-07-05: 0.5 mg via INTRAVENOUS

## 2017-07-05 MED ORDER — FENTANYL CITRATE (PF) 100 MCG/2ML IJ SOLN
INTRAMUSCULAR | Status: AC | PRN
  Administered 2017-07-05: 50 ug via INTRAVENOUS
  Administered 2017-07-05: 25 ug via INTRAVENOUS

## 2017-07-05 MED ORDER — MIDAZOLAM HCL 2 MG/2ML IJ SOLN
INTRAMUSCULAR | Status: AC
  Filled 2017-07-05: qty 4

## 2017-07-05 MED ORDER — SODIUM CHLORIDE 0.9 % IV SOLN
INTRAVENOUS | Status: DC
  Administered 2017-07-05: 10 mL/h via INTRAVENOUS

## 2017-07-05 MED ORDER — CEFAZOLIN SODIUM-DEXTROSE 2-4 GM/100ML-% IV SOLN
2.0000 g | INTRAVENOUS | Status: AC
  Administered 2017-07-05: 2 g via INTRAVENOUS

## 2017-07-05 MED ORDER — CEFAZOLIN SODIUM-DEXTROSE 2-4 GM/100ML-% IV SOLN
INTRAVENOUS | Status: AC
  Filled 2017-07-05: qty 100

## 2017-07-05 MED ORDER — HEPARIN SOD (PORK) LOCK FLUSH 100 UNIT/ML IV SOLN
INTRAVENOUS | Status: AC
  Filled 2017-07-05: qty 5

## 2017-07-05 MED ORDER — FENTANYL CITRATE (PF) 100 MCG/2ML IJ SOLN
INTRAMUSCULAR | Status: DC
Start: 2017-07-05 — End: 2017-07-06
  Filled 2017-07-05: qty 4

## 2017-07-05 MED ORDER — LIDOCAINE-EPINEPHRINE (PF) 1 %-1:200000 IJ SOLN
INTRAMUSCULAR | Status: AC
  Filled 2017-07-05: qty 30

## 2017-07-05 MED ORDER — HEPARIN SOD (PORK) LOCK FLUSH 100 UNIT/ML IV SOLN
INTRAVENOUS | Status: AC | PRN
  Administered 2017-07-05: 500 [IU] via INTRAVENOUS

## 2017-07-05 NOTE — Progress Notes (Signed)
  Oncology Nurse Navigator Documentation  Navigator Location: [facility]- (07/05/17 1559)   )Navigator Encounter Type: Telephone (07/05/17 1559) Telephone: Outgoing Call (07/05/17 1559)    Called patient to confirm appointments in the AM. I spoke with patient's daughter and she shared that he had his Port placed and is doing well.                        Interventions: Coordination of Care;Psycho-social support (07/05/17 1559)   Coordination of Care: Appts (07/05/17 1559)        Acuity: Level 2 (07/05/17 1559)   Acuity Level 2: Educational needs;Ongoing guidance and education throughout treatment as needed (07/05/17 1559)     Time Spent with Patient: 30 (07/05/17 1559)

## 2017-07-05 NOTE — Sedation Documentation (Signed)
Patient is resting comfortably. 

## 2017-07-05 NOTE — Discharge Instructions (Signed)
Implanted Port Insertion, Care After °This sheet gives you information about how to care for yourself after your procedure. Your health care provider may also give you more specific instructions. If you have problems or questions, contact your health care provider. °What can I expect after the procedure? °After your procedure, it is common to have: °· Discomfort at the port insertion site. °· Bruising on the skin over the port. This should improve over 3-4 days. ° °Follow these instructions at home: °Port care °· After your port is placed, you will get a manufacturer's information card. The card has information about your port. Keep this card with you at all times. °· Take care of the port as told by your health care provider. Ask your health care provider if you or a family member can get training for taking care of the port at home. A home health care nurse may also take care of the port. °· Make sure to remember what type of port you have. °Incision care °· Follow instructions from your health care provider about how to take care of your port insertion site. Make sure you: °? Wash your hands with soap and water before you change your bandage (dressing). If soap and water are not available, use hand sanitizer. °? Change your dressing as told by your health care provider. °? Leave stitches (sutures), skin glue, or adhesive strips in place. These skin closures may need to stay in place for 2 weeks or longer. If adhesive strip edges start to loosen and curl up, you may trim the loose edges. Do not remove adhesive strips completely unless your health care provider tells you to do that. °· Check your port insertion site every day for signs of infection. Check for: °? More redness, swelling, or pain. °? More fluid or blood. °? Warmth. °? Pus or a bad smell. °General instructions °· Do not take baths, swim, or use a hot tub until your health care provider approves. °· Do not lift anything that is heavier than 10 lb (4.5  kg) for a week, or as told by your health care provider. °· Ask your health care provider when it is okay to: °? Return to work or school. °? Resume usual physical activities or sports. °· Do not drive for 24 hours if you were given a medicine to help you relax (sedative). °· Take over-the-counter and prescription medicines only as told by your health care provider. °· Wear a medical alert bracelet in case of an emergency. This will tell any health care providers that you have a port. °· Keep all follow-up visits as told by your health care provider. This is important. °Contact a health care provider if: °· You cannot flush your port with saline as directed, or you cannot draw blood from the port. °· You have a fever or chills. °· You have more redness, swelling, or pain around your port insertion site. °· You have more fluid or blood coming from your port insertion site. °· Your port insertion site feels warm to the touch. °· You have pus or a bad smell coming from the port insertion site. °Get help right away if: °· You have chest pain or shortness of breath. °· You have bleeding from your port that you cannot control. °Summary °· Take care of the port as told by your health care provider. °· Change your dressing as told by your health care provider. °· Keep all follow-up visits as told by your health care provider. °  This information is not intended to replace advice given to you by your health care provider. Make sure you discuss any questions you have with your health care provider. °Document Released: 04/16/2013 Document Revised: 05/17/2016 Document Reviewed: 05/17/2016 °Elsevier Interactive Patient Education © 2017 [employer] Inc. ° °

## 2017-07-05 NOTE — Sedation Documentation (Signed)
Patient denies pain and is resting comfortably.  

## 2017-07-05 NOTE — H&P (Signed)
Chief Complaint: Patient was seen in consultation today for [name] a Cath placement at the request of Bobby Caldwell,Bobby Caldwell  Referring Physician(s): Bobby Caldwell,Bobby Caldwell  Supervising Physician: Daryll Brod  Patient Status: [facility] - Out-pt  History of Present Illness: Bobby Caldwell is a 61 y.o. male   Gastric cancer For chemotherapy soon [facility] request per Dr Burr Medico  Off Plavix since 06/30/17 (Peripheral vasc intervention 05/03/17)   Past Medical History:  Diagnosis Date  . COPD (chronic obstructive pulmonary disease) ([place])   . Dyspnea   . PAD (peripheral artery disease) ([place])   . Stomach cancer ([place])   . Tobacco abuse   . Weight loss     Past Surgical History:  Procedure Laterality Date  . ABDOMINAL AORTOGRAM W/LOWER EXTREMITY N/A 05/03/2017   Procedure: ABDOMINAL AORTOGRAM W/LOWER EXTREMITY;  Surgeon: Conrad Saltillo, MD;  Location: [place] [facility];  Service: Cardiovascular;  Laterality: N/A;  . ESOPHAGOGASTRODUODENOSCOPY     05/24/17  . ESOPHAGOGASTRODUODENOSCOPY (EGD) WITH PROPOFOL N/A 05/24/2017   Procedure: ESOPHAGOGASTRODUODENOSCOPY (EGD) WITH PROPOFOL;  Surgeon: [name], MD;  Location: [facility];  Service: Endoscopy;  Laterality: N/A;  . EUS N/A 06/07/2017   Procedure: UPPER ENDOSCOPIC ULTRASOUND (EUS) RADIAL;  Surgeon: [name], MD;  Location: [facility];  Service: Endoscopy;  Laterality: N/A;  . FEMORAL-FEMORAL BYPASS GRAFT Bilateral 05/08/2017   Procedure: LEFT TO RIGHT BYPASS GRAFT FEMORAL-FEMORAL ARTERY;  Surgeon: Conrad Massena, MD;  Location: [place];  Service: Vascular;  Laterality: Bilateral;  . PERIPHERAL VASCULAR INTERVENTION Left 05/03/2017   Procedure: PERIPHERAL VASCULAR INTERVENTION;  Surgeon: Conrad Narberth, MD;  Location: [place] [facility];  Service: Cardiovascular;  Laterality: Left;  common iliac    Allergies: Patient has no known allergies.  Medications: Prior to Admission medications   Medication Sig Start Date End Date Taking? Authorizing  Provider  Albuterol Sulfate (PROAIR RESPICLICK) 660 (90 Base) MCG/ACT AEPB Inhale 2 puffs into the lungs every 6 (six) hours as needed. Patient taking differently: Inhale 2 puffs into the lungs every 6 (six) hours as needed (shortness of breath).  01/10/16   [name], MD  atorvastatin (LIPITOR) 10 MG tablet Take 1 tablet (10 mg total) by mouth daily. 05/08/17   Rhyne, Hulen Shouts, PA-C  budesonide-formoterol (SYMBICORT) 160-4.5 MCG/ACT inhaler Inhale 2 puffs into the lungs 2 (two) times daily. 01/10/16   [name], MD  clopidogrel (PLAVIX) 75 MG tablet Take 1 tablet (75 mg total) by mouth daily. 05/03/17   Conrad Porter, MD  metoprolol tartrate (LOPRESSOR) 50 MG tablet Take 1 tablet (50 mg total) by mouth once for 1 dose. TAKE ONE HOUR PRIOR TO  SCHEDULE CARDAIC TEST 06/12/17 06/12/17  Leonie Man, MD  oxyCODONE-acetaminophen (ROXICET) 5-325 MG tablet Take 1 tablet by mouth every 6 (six) hours as needed for severe pain. 05/08/17   Rhyne, Hulen Shouts, PA-C  prochlorperazine (COMPAZINE) 10 MG tablet Take 1 tablet (10 mg total) by mouth every 6 (six) hours as needed for nausea or vomiting. 06/05/17   [name], MD  Tiotropium Bromide Monohydrate (SPIRIVA RESPIMAT) 2.5 MCG/ACT AERS Inhale 2 puffs into the lungs daily. 01/10/16   [name], MD     Family History  Family history unknown: Yes    Social History   Socioeconomic History  . Marital status: Married    Spouse name: None  . Number of children: 4  . Years of education: None  . Highest education level: None  Social Needs  .  Financial resource strain: None  . Food insecurity - worry: None  . Food insecurity - inability: None  . Transportation needs - medical: None  . Transportation needs - non-medical: None  Occupational History  . None  Tobacco Use  . Smoking status: Current Every Day Smoker    Packs/day: 1.00    Years: 50.00    Pack years: 50.00    Types: Cigarettes  . Smokeless tobacco: Never Used  . Tobacco  comment: 10 cigarettes per day  Substance and Sexual Activity  . Alcohol use: No    Alcohol/week: 0.0 oz  . Drug use: No  . Sexual activity: Yes  Other Topics Concern  . None  Social History Narrative  . None    Review of Systems: A 12 point ROS discussed and pertinent positives are indicated in the HPI above.  All other systems are negative.  Review of Systems  Constitutional: Positive for activity change, fatigue and unexpected weight change.  Respiratory: Negative for cough and shortness of breath.   Cardiovascular: Negative for chest pain.  Gastrointestinal: Positive for abdominal pain.  Neurological: Positive for weakness.  Psychiatric/Behavioral: Negative for behavioral problems and confusion.    Vital Signs: BP 105/90 (BP Location: Right Arm)   Pulse 99   Temp (!) 97.4 F (36.3 C) (Oral)   Ht 5\' 6"  (1.676 m)   Wt 110 lb (49.9 kg)   SpO2 97%   BMI 17.75 kg/m   Physical Exam  Constitutional: He is oriented to person, place, and time.  Cardiovascular: Normal rate and regular rhythm.  Pulmonary/Chest: Effort normal and breath sounds normal. He has no wheezes.  Abdominal: Soft. Bowel sounds are normal.  Musculoskeletal: Normal range of motion.  Neurological: He is alert and oriented to person, place, and time.  Skin: Skin is warm and dry.  Psychiatric: He has a normal mood and affect. His behavior is normal. Judgment and thought content normal.  Nursing note and vitals reviewed.   Imaging: Ct Chest W Contrast  Result Date: 06/22/2017 CLINICAL DATA:  Ulcerated distal gastric mass biopsied 06/07/2017 revealing cancer, staging workup. EXAM: CT CHEST, ABDOMEN, AND PELVIS WITH CONTRAST TECHNIQUE: Multidetector CT imaging of the chest, abdomen and pelvis was performed following the standard protocol during bolus administration of intravenous contrast. CONTRAST:  153mL ISOVUE-300 IOPAMIDOL (ISOVUE-300) INJECTION 61% COMPARISON:  04/02/2017 FINDINGS: Despite efforts by the  technologist and patient, motion artifact is present on today's exam and could not be eliminated. This reduces exam sensitivity and specificity. CT CHEST FINDINGS Cardiovascular: Intimal thickening in the descending thoracic aorta compatible with atherosclerosis. Mediastinum/Nodes: No pathologic adenopathy in the chest is identified. Small hilar and infrahilar lymph nodes are present. Lungs/Pleura: Severe centrilobular emphysema. Biapical scarring. Calcified right middle lobe nodule on image 97/6 compatible with benign granuloma. Noncalcified 0.6 by 0.5 cm right middle lobe nodule on image 116/6 stable 02/22/2015 and highly likely to be benign and incidental based on this long-term stability. 4 mm right lower lobe nodule anteriorly on image 143/6 likewise stable from 2016 and considered benign. Bilateral airway thickening noted. 3 mm peribronchovascular nodule in the right lower lobe on image 127/6. Densely calcified granuloma in the left upper lobe on image 45/6. Other calcified granulomas are present in the left lung. Musculoskeletal: Unremarkable CT ABDOMEN PELVIS FINDINGS Hepatobiliary: No findings of metastatic disease to the liver. The gallbladder unremarkable. No significant biliary dilatation. Pancreas: Unremarkable Spleen: Unremarkable Adrenals/Urinary Tract: Unremarkable Stomach/Bowel: The gastric mass is surprisingly inconspicuous on CT, but may be present  posteriorly along the gastric wall on image 83/2. This area is difficult to compare on the prior exam because motion artifact caused blurring of this portion of the stomach previously. Mild prominence of stool in the proximal half of the colon but not the distal half. 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 Aortoiliac atherosclerotic vascular disease. Occluded right common iliac artery with patent femoral-femoral bypass and resulting contrast opacification in the external and internal iliac arteries on the right. There are  several clustered right gastric lymph nodes, the largest is 7 mm in short axis on image 68/2. These were blurred on the prior exam due to motion artifact. No overtly pathologic regional adenopathy is identified. Reproductive: Unremarkable Other: No supplemental non-categorized findings. Musculoskeletal: Disc bulges potentially causing central narrowing of the thecal sac at L3-4 and especially L4-5. IMPRESSION: 1. The gastric mass is somewhat inconspicuous on CT, but a potential 1.2 cm thick mucosal masses seen along the posterior gastric border on image 83/2. There several small adjacent right gastric lymph nodes measuring up to 0.7 cm in short axis, but no overtly pathologic adenopathy and no findings of hepatic or peritoneal metastatic disease at this time. 2. Calcified granulomas in both lungs along with several noncalcified nodules stable from August 2016 and likely benign given this long-term stability. 3. Other imaging findings of potential clinical significance: Aortic Atherosclerosis (ICD10-I70.0) and Emphysema (ICD10-J43.9). Airway thickening is present, suggesting bronchitis or reactive airways disease. 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 Disc bulges at L3-4 and L4-5. Electronically Signed   By: Van Clines M.D.   On: 06/22/2017 11:11   Ct Abdomen Pelvis W Contrast  Result Date: 06/22/2017 CLINICAL DATA:  Ulcerated distal gastric mass biopsied 06/07/2017 revealing cancer, staging workup. EXAM: CT CHEST, ABDOMEN, AND PELVIS WITH CONTRAST TECHNIQUE: Multidetector CT imaging of the chest, abdomen and pelvis was performed following the standard protocol during bolus administration of intravenous contrast. CONTRAST:  132mL ISOVUE-300 IOPAMIDOL (ISOVUE-300) INJECTION 61% COMPARISON:  04/02/2017 FINDINGS: Despite efforts by the technologist and patient, motion artifact is present on today's exam and could not be  eliminated. This reduces exam sensitivity and specificity. CT CHEST FINDINGS Cardiovascular: Intimal thickening in the descending thoracic aorta compatible with atherosclerosis. Mediastinum/Nodes: No pathologic adenopathy in the chest is identified. Small hilar and infrahilar lymph nodes are present. Lungs/Pleura: Severe centrilobular emphysema. Biapical scarring. Calcified right middle lobe nodule on image 97/6 compatible with benign granuloma. Noncalcified 0.6 by 0.5 cm right middle lobe nodule on image 116/6 stable 02/22/2015 and highly likely to be benign and incidental based on this long-term stability. 4 mm right lower lobe nodule anteriorly on image 143/6 likewise stable from 2016 and considered benign. Bilateral airway thickening noted. 3 mm peribronchovascular nodule in the right lower lobe on image 127/6. Densely calcified granuloma in the left upper lobe on image 45/6. Other calcified granulomas are present in the left lung. Musculoskeletal: Unremarkable CT ABDOMEN PELVIS FINDINGS Hepatobiliary: No findings of metastatic disease to the liver. The gallbladder unremarkable. No significant biliary dilatation. Pancreas: Unremarkable Spleen: Unremarkable Adrenals/Urinary Tract: Unremarkable Stomach/Bowel: The gastric mass is surprisingly inconspicuous on CT, but may be present posteriorly along the gastric wall on image 83/2. This area is difficult to compare on the prior exam because motion artifact caused blurring of this portion of the stomach previously. Mild prominence of stool in the proximal half of the colon but not the distal half. Vascular/Lymphatic: Severely stenotic origin of the celiac  artery. Aortoiliac atherosclerotic vascular disease. Occluded right common iliac artery with patent femoral-femoral bypass and resulting contrast opacification in the external and internal iliac arteries on the right. There are several clustered right gastric lymph nodes, the largest is 7 mm in short axis on image  68/2. These were blurred on the prior exam due to motion artifact. No overtly pathologic regional adenopathy is identified. Reproductive: Unremarkable Other: No supplemental non-categorized findings. Musculoskeletal: Disc bulges potentially causing central narrowing of the thecal sac at L3-4 and especially L4-5. IMPRESSION: 1. The gastric mass is somewhat inconspicuous on CT, but a potential 1.2 cm thick mucosal masses seen along the posterior gastric border on image 83/2. There several small adjacent right gastric lymph nodes measuring up to 0.7 cm in short axis, but no overtly pathologic adenopathy and no findings of hepatic or peritoneal metastatic disease at this time. 2. Calcified granulomas in both lungs along with several noncalcified nodules stable from August 2016 and likely benign given this long-term stability. 3. Other imaging findings of potential clinical significance: Aortic Atherosclerosis (ICD10-I70.0) and Emphysema (ICD10-J43.9). Airway thickening is present, suggesting bronchitis or reactive airways disease. 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 Disc bulges at L3-4 and L4-5. Electronically Signed   By: Van Clines M.D.   On: 06/22/2017 11:11    Labs:  CBC: Recent Labs    05/08/17 1416 05/09/17 0435 06/19/17 1043 07/05/17 1015  WBC 8.1 14.5* 8.3 9.0  HGB 13.4 11.9* 15.9 16.3  HCT 39.4 35.1* 47.1 47.9  PLT 212 255 210 196    COAGS: Recent Labs    05/08/17 0623  INR 1.03  APTT 30    BMP: Recent Labs    05/03/17 0620  05/04/17 0238 05/08/17 0623 05/08/17 1416 05/09/17 0435 06/19/17 1043  NA 139  --  137 139  --  136 138  K 4.6  --  3.8 3.3*  --  3.9 3.7  CL 99*  --  106 103  --  102  --   CO2  --   --  25 28  --  28 26  GLUCOSE 81  --  77 91  --  130* 92  BUN 11  --  9 6  --  9 6.6*  CALCIUM  --   --  8.2* 8.6*  --  8.2* 9.4  CREATININE 0.50*   < > 0.52* 0.56* 0.56* 0.50* 0.7   GFRNONAA  --    < > >60 >60 >60 >60  --   GFRAA  --    < > >60 >60 >60 >60  --    < > = values in this interval not displayed.    LIVER FUNCTION TESTS: Recent Labs    03/26/17 1040 05/08/17 0623 06/19/17 1043  BILITOT 0.4 0.8 0.52  AST 16 16 16   ALT 20 18 15   ALKPHOS 102 88 116  PROT 6.9 6.1* 7.5  ALBUMIN 3.9 3.1* 3.8    TUMOR MARKERS: No results for input(s): AFPTM, CEA, CA199, CHROMGRNA in the last 8760 hours.  Assessment and Plan:  Gastric cancer PAC per Dr Burr Medico Risks and benefits discussed with the patient including, but not limited to bleeding, infection, pneumothorax, or fibrin sheath development and need for additional procedures. All of the patient's questions were answered, patient is agreeable to proceed. Consent signed and in chart.   Thank you for this interesting consult.  I greatly enjoyed meeting Avner Stroder and look  forward to participating in their care.  A copy of this report was sent to the requesting provider on this date.  Electronically Signed: Lavonia Drafts, PA-C 07/05/2017, 10:57 AM   I spent a total of  30 Minutes   in face to face in clinical consultation, greater than 50% of which was counseling/coordinating care for [facility] a Cath placement

## 2017-07-05 NOTE — Procedures (Signed)
Gastric ca  S/p RT IJ POWER PORT NO COMP STABLE EBL 0 TIP SVCRA READY FOR USE FULL REPORT IN PACS

## 2017-07-05 NOTE — Telephone Encounter (Signed)
Scheduled appts per 12/24 sch msg - boths times I have called he is unavailable to answer and I cannot leave a voicemail.

## 2017-07-05 NOTE — Telephone Encounter (Signed)
Called patients home and spoke with daughter regarding [place] appointments. Okay to change treatment in the infusion room to Saturday 12/29 at 0830, with pump d/c on 12/31. Verbalized understanding.

## 2017-07-06 ENCOUNTER — Ambulatory Visit (HOSPITAL_BASED_OUTPATIENT_CLINIC_OR_DEPARTMENT_OTHER): Payer: BLUE CROSS/BLUE SHIELD

## 2017-07-06 ENCOUNTER — Telehealth: Payer: Self-pay | Admitting: Hematology

## 2017-07-06 ENCOUNTER — Ambulatory Visit (HOSPITAL_BASED_OUTPATIENT_CLINIC_OR_DEPARTMENT_OTHER): Payer: BLUE CROSS/BLUE SHIELD | Admitting: Hematology

## 2017-07-06 ENCOUNTER — Other Ambulatory Visit: Payer: BLUE CROSS/BLUE SHIELD

## 2017-07-06 ENCOUNTER — Other Ambulatory Visit (HOSPITAL_BASED_OUTPATIENT_CLINIC_OR_DEPARTMENT_OTHER): Payer: BLUE CROSS/BLUE SHIELD

## 2017-07-06 ENCOUNTER — Ambulatory Visit (HOSPITAL_COMMUNITY): Payer: BLUE CROSS/BLUE SHIELD

## 2017-07-06 ENCOUNTER — Ambulatory Visit: Payer: BLUE CROSS/BLUE SHIELD

## 2017-07-06 ENCOUNTER — Encounter: Payer: Self-pay | Admitting: Hematology

## 2017-07-06 VITALS — BP 133/81 | HR 78 | Temp 97.8°F | Resp 18 | Ht 66.0 in | Wt 115.2 lb

## 2017-07-06 DIAGNOSIS — C169 Malignant neoplasm of stomach, unspecified: Secondary | ICD-10-CM | POA: Diagnosis not present

## 2017-07-06 DIAGNOSIS — Z720 Tobacco use: Secondary | ICD-10-CM

## 2017-07-06 DIAGNOSIS — Z95828 Presence of other vascular implants and grafts: Secondary | ICD-10-CM

## 2017-07-06 DIAGNOSIS — R110 Nausea: Secondary | ICD-10-CM | POA: Diagnosis not present

## 2017-07-06 DIAGNOSIS — C160 Malignant neoplasm of cardia: Secondary | ICD-10-CM

## 2017-07-06 DIAGNOSIS — R634 Abnormal weight loss: Secondary | ICD-10-CM | POA: Diagnosis not present

## 2017-07-06 DIAGNOSIS — R630 Anorexia: Secondary | ICD-10-CM | POA: Diagnosis not present

## 2017-07-06 DIAGNOSIS — C162 Malignant neoplasm of body of stomach: Secondary | ICD-10-CM

## 2017-07-06 DIAGNOSIS — J449 Chronic obstructive pulmonary disease, unspecified: Secondary | ICD-10-CM

## 2017-07-06 LAB — CEA (IN HOUSE-CHCC): CEA (CHCC-In House): 2.88 ng/mL (ref 0.00–5.00)

## 2017-07-06 LAB — COMPREHENSIVE METABOLIC PANEL
ALBUMIN: 3.5 g/dL (ref 3.5–5.0)
ALK PHOS: 110 U/L (ref 40–150)
ALT: 14 U/L (ref 0–55)
ANION GAP: 6 meq/L (ref 3–11)
AST: 13 U/L (ref 5–34)
BILIRUBIN TOTAL: 0.3 mg/dL (ref 0.20–1.20)
BUN: 10.2 mg/dL (ref 7.0–26.0)
CALCIUM: 8.6 mg/dL (ref 8.4–10.4)
CO2: 30 mEq/L — ABNORMAL HIGH (ref 22–29)
Chloride: 103 mEq/L (ref 98–109)
Creatinine: 0.6 mg/dL — ABNORMAL LOW (ref 0.7–1.3)
GLUCOSE: 77 mg/dL (ref 70–140)
POTASSIUM: 3.8 meq/L (ref 3.5–5.1)
Sodium: 139 mEq/L (ref 136–145)
TOTAL PROTEIN: 6.6 g/dL (ref 6.4–8.3)

## 2017-07-06 LAB — CBC WITH DIFFERENTIAL/PLATELET
BASO%: 0.8 % (ref 0.0–2.0)
Basophils Absolute: 0.1 10*3/uL (ref 0.0–0.1)
EOS%: 3.1 % (ref 0.0–7.0)
Eosinophils Absolute: 0.2 10*3/uL (ref 0.0–0.5)
HEMATOCRIT: 44.5 % (ref 38.4–49.9)
HGB: 14.9 g/dL (ref 13.0–17.1)
LYMPH#: 2.2 10*3/uL (ref 0.9–3.3)
LYMPH%: 27.7 % (ref 14.0–49.0)
MCH: 29.4 pg (ref 27.2–33.4)
MCHC: 33.5 g/dL (ref 32.0–36.0)
MCV: 87.8 fL (ref 79.3–98.0)
MONO#: 0.8 10*3/uL (ref 0.1–0.9)
MONO%: 9.8 % (ref 0.0–14.0)
NEUT%: 58.6 % (ref 39.0–75.0)
NEUTROS ABS: 4.5 10*3/uL (ref 1.5–6.5)
Platelets: 166 10*3/uL (ref 140–400)
RBC: 5.07 10*6/uL (ref 4.20–5.82)
RDW: 12.8 % (ref 11.0–14.6)
WBC: 7.8 10*3/uL (ref 4.0–10.3)

## 2017-07-06 MED ORDER — TRAMADOL HCL 50 MG PO TABS: 50.0000 mg | ORAL_TABLET | Freq: Four times a day (QID) | ORAL | 0 refills | Status: DC | PRN

## 2017-07-06 MED ORDER — PROCHLORPERAZINE MALEATE 10 MG PO TABS: 10.0000 mg | ORAL_TABLET | Freq: Four times a day (QID) | ORAL | 1 refills | Status: DC | PRN

## 2017-07-06 MED ORDER — SODIUM CHLORIDE 0.9% FLUSH
10.0000 mL | Freq: Once | INTRAVENOUS | Status: AC
  Administered 2017-07-06: 10 mL
  Filled 2017-07-06: qty 10

## 2017-07-06 MED ORDER — LIDOCAINE-PRILOCAINE 2.5-2.5 % EX CREA: TOPICAL_CREAM | CUTANEOUS | 3 refills | Status: DC

## 2017-07-06 MED ORDER — HEPARIN SOD (PORK) LOCK FLUSH 100 UNIT/ML IV SOLN
500.0000 [IU] | Freq: Once | INTRAVENOUS | Status: AC
  Administered 2017-07-06: 500 [IU]
  Filled 2017-07-06: qty 5

## 2017-07-06 MED ORDER — ONDANSETRON HCL 8 MG PO TABS: 8.0000 mg | ORAL_TABLET | Freq: Two times a day (BID) | ORAL | 1 refills | Status: DC | PRN

## 2017-07-06 NOTE — Telephone Encounter (Signed)
Unable to schedule treatments due to capped days - scheduled 1/24 sp patient can have pump d/c on Saturday - will contact patient when appts are added.

## 2017-07-06 NOTE — Progress Notes (Signed)
Bobby Caldwell  Telephone:([phone]) [phone] Fax:([phone]) [phone]  Clinic Follow Up Note   Patient Care Team: Minette Brine as PCP - General (General Practice) [name], MD as Consulting Physician (Pulmonary Disease) [name], MD as Consulting Physician (Hematology) [name], MD as Attending Physician (Gastroenterology) [name], MD as Consulting Physician (General Surgery)   Date of Service: 07/06/2017   CHIEF COMPLAINTS:  Follow up gastric Adenocarcinoma   Oncology History   Cancer Staging Gastric cancer [facility]) Staging form: Stomach, AJCC 8th Edition - Clinical stage from 05/24/2017: Stage I (cT2, cN0, cM0) - Signed by [name], MD on 06/19/2017       Gastric cancer ([place])   05/24/2017 Procedure    EGD by Dr. [name] 05/24/17 IMPRESSION Ulcerated mass like region in the distal stomach (4cm across), extensively biopsied. This appears neoplastic. - Abnormal mucosa throughout stomach otherwise (mild-moderate gastritis), also extensively biopsied.      05/24/2017 Initial Biopsy    Diagnosis 05/24/17  1. Stomach, biopsy, mass distal - ADENOCARCINOMA, SEE COMMENT. - CHRONIC ACTIVE GASTRITIS WITH INTESTINAL METAPLASIA. 2. 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 - NO DYSPLASIA OR MALIGNANCY      06/04/2017 Initial Diagnosis    Gastric cancer ([place])      06/07/2017 Procedure    EUS by Dr. [name] on 06/07/17  IMPRESSION:  5cm across, partially circumferential uT2N0 gastric adenocarcinoma laying in the distal stomach 3-4cm proximal to the pylorus.      06/22/2017 Imaging    CT CAP W Contrast 06/22/17  IMPRESSION: 1. The gastric mass is somewhat inconspicuous on CT, but a potential 1.2 cm thick mucosal masses seen along the posterior gastric border on image 83/2. There several small adjacent right gastric lymph nodes measuring up to 0.7 cm in short axis, but  no overtly pathologic adenopathy and no findings of hepatic or peritoneal metastatic disease at this time. 2. Calcified granulomas in both lungs along with several noncalcified nodules stable from August 2016 and likely benign given this long-term stability. 3. Other imaging findings of potential clinical significance: Aortic Atherosclerosis (ICD10-I70.0) and Emphysema (ICD10-J43.9). Airway thickening is present, suggesting bronchitis or reactive airways disease. 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 Disc bulges at L3-4 and L4-5.       Chemotherapy    Neoadjuvant FOLFOX with with leucovorin every 2 weeks for 6 cycles starting 07/07/17. Will start with reduced dose for cycle 1.       07/05/2017 Surgery    PAC placement         HISTORY OF PRESENTING ILLNESS: 06/05/17  Bobby Caldwell 61 y.o. male is here because of newly diagnosed Gastric Cancer. He was referred by Dr. [name]. He presents to the clinic today accompanied by an albanian interpretor and then helped by video interpretor [phone] to help translate his visit.   In the past he was diagnosed with COPD controlled with inhalers. He has smoked for several years and is down to smoking 10 cigars a day. He is actively trying to quit.   Today he notes he is upset about his diagnosis and he recently had bypass surgery on his bilateral legs in 04/2017 and feels fatigued from this. He met with Dr. [name] today and he was told he will need chemo and surgery. He is willing to go with whatever is recommended.  He notes this started with issues with both legs  feeling weak and shaking. This occurred for 2-3 months. He had bypass surgery with Dr. Bridgett Larsson. He does not walk much so he has not tested how well he can walk much. The 03/2017 CT scan before his surgery showed thickening in his stomach which is why he was referred to Dr. [name]. Patient notes his stomach issues has  been ongoing, but recently has burning sensation below umbilical area since endoscopy. This was only mildly presents prior to procedure. His appetite has not changed much. He doesn't dare to eat much as he is afraid to vomit. He has felt this way for th past 6 months. He currently loss 14 pounds in the past 6 months. He does not go out much anymore, he mostly stays in his house. He is able to do ALDs himself, his wife helps out around the house and cares for him. He has 4 children, 37-21yo. His wife does not work. He does not find himself feeling up to working anymore, he has no energy to do anything.  He notes pain in his chest from his COPD. He uses inhalers 3 times a day. He would like to do the surgery alone but if chemo is needed he is fine with proceeding with treatment. He reports his body shakes when he gets blood drawn. He is willing to try oral iron in the meantime. He would like to be seen by social worker to help him our with finances as he is not able to work. He feels he often has to break due to sever fatigue and weakness.     CURRENT THERAPY: neoadjuvant FOLFOX with leucovorin every 2 weeks for 6 cycles starting 07/07/17    INTERVAL HISTORY:  Bobby Caldwell is here for a follow up. He presents to the clinic today accompanied by his wife and daughter who help to translate the visit for him. His daughter is temporarily in town and will return to [place] soon.   His cardiac workup went well. Yesterday he had port placed and he had pain so much it was hard for him to sleep. He took 2 tylenol. The pain is in his incision site and swollen some.  He has been very tired so he does not get around well. He did not go to chemo class yesterday. He notes he will usually need transportation help.    MEDICAL HISTORY:  Past Medical History:  Diagnosis Date  . COPD (chronic obstructive pulmonary disease) ([place])   . Dyspnea   . PAD (peripheral artery disease) ([place])   . Stomach cancer ([place])   .  Tobacco abuse   . Weight loss     SURGICAL HISTORY: Past Surgical History:  Procedure Laterality Date  . ABDOMINAL AORTOGRAM W/LOWER EXTREMITY N/A 05/03/2017   Procedure: ABDOMINAL AORTOGRAM W/LOWER EXTREMITY;  Surgeon: Conrad Westport, MD;  Location: [place] [facility];  Service: Cardiovascular;  Laterality: N/A;  . ESOPHAGOGASTRODUODENOSCOPY     05/24/17  . ESOPHAGOGASTRODUODENOSCOPY (EGD) WITH PROPOFOL N/A 05/24/2017   Procedure: ESOPHAGOGASTRODUODENOSCOPY (EGD) WITH PROPOFOL;  Surgeon: [name], MD;  Location: [facility];  Service: Endoscopy;  Laterality: N/A;  . EUS N/A 06/07/2017   Procedure: UPPER ENDOSCOPIC ULTRASOUND (EUS) RADIAL;  Surgeon: [name], MD;  Location: [facility];  Service: Endoscopy;  Laterality: N/A;  . FEMORAL-FEMORAL BYPASS GRAFT Bilateral 05/08/2017   Procedure: LEFT TO RIGHT BYPASS GRAFT FEMORAL-FEMORAL ARTERY;  Surgeon: Conrad Seminary, MD;  Location: [place];  Service: Vascular;  Laterality: Bilateral;  . IR FLUORO  GUIDE PORT INSERTION RIGHT  07/05/2017  . IR US GUIDE VASC ACCESS RIGHT  07/05/2017  . PERIPHERAL VASCULAR INTERVENTION Left 05/03/2017   Procedure: PERIPHERAL VASCULAR INTERVENTION;  Surgeon: Conrad [place], MD;  Location[facility];  Service: Cardiovascular;  Laterality: Left;  common iliac    SOCIAL HISTORY: Social History   Socioeconomic History  . Marital status: Married    Spouse name: Not on file  . Number of children: 4  . Years of education: Not on file  . Highest education level: Not on file  Social Needs  . Financial resource strain: Not on file  . Food insecurity - worry: Not on file  . Food insecurity - inability: Not on file  . Transportation needs - medical: Not on file  . Transportation needs - non-medical: Not on file  Occupational History  . Not on file  Tobacco Use  . Smoking status: Current Every Day Smoker    Packs/day: 1.00    Years: 50.00    Pack years: 50.00    Types: Cigarettes  .  Smokeless tobacco: Never Used  . Tobacco comment: 10 cigarettes per day  Substance and Sexual Activity  . Alcohol use: No    Alcohol/week: 0.0 oz  . Drug use: No  . Sexual activity: Yes  Other Topics Concern  . Not on file  Social History Narrative  . Not on file    FAMILY HISTORY: Family History  Family history unknown: Yes    ALLERGIES:  has No Known Allergies.  MEDICATIONS:  Current Outpatient Medications  Medication Sig Dispense Refill  . Albuterol Sulfate (PROAIR RESPICLICK) 793 (90 Base) MCG/ACT AEPB Inhale 2 puffs into the lungs every 6 (six) hours as needed. (Patient taking differently: Inhale 2 puffs into the lungs every 6 (six) hours as needed (shortness of breath). ) 1 each 6  . atorvastatin (LIPITOR) 10 MG tablet Take 1 tablet (10 mg total) by mouth daily. 30 tablet 2  . budesonide-formoterol (SYMBICORT) 160-4.5 MCG/ACT inhaler Inhale 2 puffs into the lungs 2 (two) times daily. 1 Inhaler 6  . clopidogrel (PLAVIX) 75 MG tablet Take 1 tablet (75 mg total) by mouth daily. 30 tablet 11  . lidocaine-prilocaine (EMLA) cream Apply to affected area once 30 g 3  . loperamide (IMODIUM A-D) 2 MG tablet Take 2 mg by mouth 4 (four) times daily as needed for diarrhea or loose stools.    . ondansetron (ZOFRAN) 8 MG tablet Take 1 tablet (8 mg total) by mouth 2 (two) times daily as needed for refractory nausea / vomiting. Start on day 3 after chemotherapy. 30 tablet 1  . oxyCODONE-acetaminophen (ROXICET) 5-325 MG tablet Take 1 tablet by mouth every 6 (six) hours as needed for severe pain. 20 tablet 0  . prochlorperazine (COMPAZINE) 10 MG tablet Take 1 tablet (10 mg total) by mouth every 6 (six) hours as needed for nausea or vomiting. 30 tablet 0  . prochlorperazine (COMPAZINE) 10 MG tablet Take 1 tablet (10 mg total) by mouth every 6 (six) hours as needed (Nausea or vomiting). 30 tablet 1  . Tiotropium Bromide Monohydrate (SPIRIVA RESPIMAT) 2.5 MCG/ACT AERS Inhale 2 puffs into the lungs  daily. (Patient taking differently: Inhale 1 puff into the lungs daily. ) 1 Inhaler 6  . traMADol (ULTRAM) 50 MG tablet Take 1 tablet (50 mg total) by mouth every 6 (six) hours as needed. 30 tablet 0   No current facility-administered medications for this visit.     REVIEW  OF SYSTEMS:  Constitutional: Denies fevers, chills or abnormal night sweats (+) low appetite (+) weight gain Eyes: Denies blurriness of vision, double vision or watery eyes Ears, nose, mouth, throat, and face: Denies mucositis or sore throat Respiratory: (+) chest pain from COPD Cardiovascular: Denies palpitation or lower extremity swelling   Gastrointestinal:  Denies nausea, heartburn or change in bowel habits (+) lower abdominal pain  Skin: Denies abnormal skin rashes (+) pain from port  Lymphatics: Denies new lymphadenopathy or easy bruising Neurological:Denies numbness, tingling or new weaknesses MSK: (+) bilateral leg weakness/slight pain  Behavioral/Psych: Mood is stable, no new changes  All other systems were reviewed with the patient and are negative.   PHYSICAL EXAMINATION:  ECOG PERFORMANCE STATUS: 2 - Symptomatic, <50% confined to bed  Vitals:   07/06/17 1102  BP: 133/81  Pulse: 78  Resp: 18  Temp: 97.8 F (36.6 C)  SpO2: 97%   Filed Weights   07/06/17 1102  Weight: 115 lb 3.2 oz (52.3 kg)    GENERAL:alert, no distress and comfortable SKIN: skin color, texture, turgor are normal, no rashes or significant lesions (+) mild erythema at port site, no sign of infection  EYES: normal, conjunctiva are pink and non-injected, sclera clear OROPHARYNX:no exudate, no erythema and lips, buccal mucosa, and tongue normal  NECK: supple, thyroid normal size, non-tender, without nodularity LYMPH:  no palpable lymphadenopathy in the cervical, axillary or inguinal LUNGS: clear to auscultation and percussion with normal breathing effort (+) decreased breast sounds bilaterally.   HEART: regular rate & rhythm and  no murmurs and no lower extremity edema ABDOMEN:abdomen soft, non-tender and normal bowel sounds  Musculoskeletal:no cyanosis of digits and no clubbing  PSYCH: alert & oriented x 3 with fluent speech NEURO: no focal motor/sensory deficits  LABORATORY DATA:  I have reviewed the data as listed CBC Latest Ref Rng & Units 07/06/2017 07/05/2017 06/19/2017  WBC 4.0 - 10.3 10e3/uL 7.8 9.0 8.3  Hemoglobin 13.0 - 17.1 g/dL 14.9 16.3 15.9  Hematocrit 38.4 - 49.9 % 44.5 47.9 47.1  Platelets 140 - 400 10e3/uL 166 196 210     CMP Latest Ref Rng & Units 07/06/2017 07/05/2017 06/19/2017  Glucose 70 - 140 mg/dl 77 102(H) 92  BUN 7.0 - 26.0 mg/dL 10.2 8 6.6(L)  Creatinine 0.7 - 1.3 mg/dL 0.6(L) 0.64 0.7  Sodium 136 - 145 mEq/L 139 138 138  Potassium 3.5 - 5.1 mEq/L 3.8 3.7 3.7  Chloride 101 - 111 mmol/L - 98(L) -  CO2 22 - 29 mEq/L 30(H) 30 26  Calcium 8.4 - 10.4 mg/dL 8.6 8.8(L) 9.4  Total Protein 6.4 - 8.3 g/dL 6.6 - 7.5  Total Bilirubin 0.20 - 1.20 mg/dL 0.30 - 0.52  Alkaline Phos 40 - 150 U/L 110 - 116  AST 5 - 34 U/L 13 - 16  ALT 0 - 55 U/L 14 - 15    CEA  06/19/17: 3.38 07/06/17: 2.88   PATHOLOGY  Diagnosis 05/24/17  1. Stomach, biopsy, mass distal - ADENOCARCINOMA, SEE COMMENT. - CHRONIC ACTIVE GASTRITIS WITH INTESTINAL METAPLASIA. 2. 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 - NO DYSPLASIA OR MALIGNANCY. Microscopic Comment 1. The fragments are superficial and therefore assessment of invasion is hampered. Dr. Saralyn Pilar has reviewed the case. The case was called to Dr. [name] on 05/25/2017. 2. A Warthin-Starry stain is performed to determine the possibility of the presence of Helicobacter pylori. The Warthin-Starry stain is negative for organisms of Helicobacter  pylori   PROCEDURES  EUS by Dr. [name] on 06/07/17  IMPRESSION:  5cm across, partially circumferential uT2N0 gastric adenocarcinoma  laying in the distal stomach 3-4cm proximal to the pylorus.    EGD by Dr. [name] 05/24/17 IMPRESSION Ulcerated mass like region in the distal stomach (4cm across), extensively biopsied. This appears neoplastic. - Abnormal mucosa throughout stomach otherwise (mild-moderate gastritis), also extensively biopsied.   RADIOGRAPHIC STUDIES: I have personally reviewed the radiological images as listed and agreed with the findings in the report. Ct Chest W Contrast  Result Date: 06/22/2017 CLINICAL DATA:  Ulcerated distal gastric mass biopsied 06/07/2017 revealing cancer, staging workup. EXAM: CT CHEST, ABDOMEN, AND PELVIS WITH CONTRAST TECHNIQUE: Multidetector CT imaging of the chest, abdomen and pelvis was performed following the standard protocol during bolus administration of intravenous contrast. CONTRAST:  180m ISOVUE-300 IOPAMIDOL (ISOVUE-300) INJECTION 61% COMPARISON:  04/02/2017 FINDINGS: Despite efforts by the technologist and patient, motion artifact is present on today's exam and could not be eliminated. This reduces exam sensitivity and specificity. CT CHEST FINDINGS Cardiovascular: Intimal thickening in the descending thoracic aorta compatible with atherosclerosis. Mediastinum/Nodes: No pathologic adenopathy in the chest is identified. Small hilar and infrahilar lymph nodes are present. Lungs/Pleura: Severe centrilobular emphysema. Biapical scarring. Calcified right middle lobe nodule on image 97/6 compatible with benign granuloma. Noncalcified 0.6 by 0.5 cm right middle lobe nodule on image 116/6 stable 02/22/2015 and highly likely to be benign and incidental based on this long-term stability. 4 mm right lower lobe nodule anteriorly on image 143/6 likewise stable from 2016 and considered benign. Bilateral airway thickening noted. 3 mm peribronchovascular nodule in the right lower lobe on image 127/6. Densely calcified granuloma in the left upper lobe on image 45/6. Other calcified granulomas  are present in the left lung. Musculoskeletal: Unremarkable CT ABDOMEN PELVIS FINDINGS Hepatobiliary: No findings of metastatic disease to the liver. The gallbladder unremarkable. No significant biliary dilatation. Pancreas: Unremarkable Spleen: Unremarkable Adrenals/Urinary Tract: Unremarkable Stomach/Bowel: The gastric mass is surprisingly inconspicuous on CT, but may be present posteriorly along the gastric wall on image 83/2. This area is difficult to compare on the prior exam because motion artifact caused blurring of this portion of the stomach previously. Mild prominence of stool in the proximal half of the colon but not the distal half. 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 Aortoiliac atherosclerotic vascular disease. Occluded right common iliac artery with patent femoral-femoral bypass and resulting contrast opacification in the external and internal iliac arteries on the right. There are several clustered right gastric lymph nodes, the largest is 7 mm in short axis on image 68/2. These were blurred on the prior exam due to motion artifact. No overtly pathologic regional adenopathy is identified. Reproductive: Unremarkable Other: No supplemental non-categorized findings. Musculoskeletal: Disc bulges potentially causing central narrowing of the thecal sac at L3-4 and especially L4-5. IMPRESSION: 1. The gastric mass is somewhat inconspicuous on CT, but a potential 1.2 cm thick mucosal masses seen along the posterior gastric border on image 83/2. There several small adjacent right gastric lymph nodes measuring up to 0.7 cm in short axis, but no overtly pathologic adenopathy and no findings of hepatic or peritoneal metastatic disease at this time. 2. Calcified granulomas in both lungs along with several noncalcified nodules stable from August 2016 and likely benign given this long-term stability. 3. Other imaging findings of potential clinical significance: Aortic Atherosclerosis  (ICD10-I70.0) and Emphysema (ICD10-J43.9). Airway thickening is present, suggesting bronchitis or reactive airways disease. Severely stenotic origin of  the celiac trunk. Occluded right common iliac artery with patent femoral-femoral bypass. Disc bulges at L3-4 and L4-5. Electronically Signed   By: Van Clines M.D.   On: 06/22/2017 11:11   Ct Abdomen Pelvis W Contrast  Result Date: 06/22/2017 CLINICAL DATA:  Ulcerated distal gastric mass biopsied 06/07/2017 revealing cancer, staging workup. EXAM: CT CHEST, ABDOMEN, AND PELVIS WITH CONTRAST TECHNIQUE: Multidetector CT imaging of the chest, abdomen and pelvis was performed following the standard protocol during bolus administration of intravenous contrast. CONTRAST:  128m ISOVUE-300 IOPAMIDOL (ISOVUE-300) INJECTION 61% COMPARISON:  04/02/2017 FINDINGS: Despite efforts by the technologist and patient, motion artifact is present on today's exam and could not be eliminated. This reduces exam sensitivity and specificity. CT CHEST FINDINGS Cardiovascular: Intimal thickening in the descending thoracic aorta compatible with atherosclerosis. Mediastinum/Nodes: No pathologic adenopathy in the chest is identified. Small hilar and infrahilar lymph nodes are present. Lungs/Pleura: Severe centrilobular emphysema. Biapical scarring. Calcified right middle lobe nodule on image 97/6 compatible with benign granuloma. Noncalcified 0.6 by 0.5 cm right middle lobe nodule on image 116/6 stable 02/22/2015 and highly likely to be benign and incidental based on this long-term stability. 4 mm right lower lobe nodule anteriorly on image 143/6 likewise stable from 2016 and considered benign. Bilateral airway thickening noted. 3 mm peribronchovascular nodule in the right lower lobe on image 127/6. Densely calcified granuloma in the left upper lobe on image 45/6. Other calcified granulomas are present in the left lung. Musculoskeletal: Unremarkable CT ABDOMEN PELVIS FINDINGS  Hepatobiliary: No findings of metastatic disease to the liver. The gallbladder unremarkable. No significant biliary dilatation. Pancreas: Unremarkable Spleen: Unremarkable Adrenals/Urinary Tract: Unremarkable Stomach/Bowel: The gastric mass is surprisingly inconspicuous on CT, but may be present posteriorly along the gastric wall on image 83/2. This area is difficult to compare on the prior exam because motion artifact caused blurring of this portion of the stomach previously. Mild prominence of stool in the proximal half of the colon but not the distal half. 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 Aortoiliac atherosclerotic vascular disease. Occluded right common iliac artery with patent femoral-femoral bypass and resulting contrast opacification in the external and internal iliac arteries on the right. There are several clustered right gastric lymph nodes, the largest is 7 mm in short axis on image 68/2. These were blurred on the prior exam due to motion artifact. No overtly pathologic regional adenopathy is identified. Reproductive: Unremarkable Other: No supplemental non-categorized findings. Musculoskeletal: Disc bulges potentially causing central narrowing of the thecal sac at L3-4 and especially L4-5. IMPRESSION: 1. The gastric mass is somewhat inconspicuous on CT, but a potential 1.2 cm thick mucosal masses seen along the posterior gastric border on image 83/2. There several small adjacent right gastric lymph nodes measuring up to 0.7 cm in short axis, but no overtly pathologic adenopathy and no findings of hepatic or peritoneal metastatic disease at this time. 2. Calcified granulomas in both lungs along with several noncalcified nodules stable from August 2016 and likely benign given this long-term stability. 3. Other imaging findings of potential clinical significance: Aortic Atherosclerosis (ICD10-I70.0) and Emphysema (ICD10-J43.9). Airway thickening is present, suggesting  bronchitis or reactive airways disease. 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 Disc bulges at L3-4 and L4-5. Electronically Signed   By: WVan ClinesM.D.   On: 06/22/2017 11:11   Ir [place]Guide Vasc Access Right  Result Date: 07/05/2017 CLINICAL DATA:  GASTRIC CANCER, ACCESS FOR CHEMOTHERAPY EXAM:  RIGHT INTERNAL JUGULAR SINGLE LUMEN POWER PORT CATHETER INSERTION Date:  12/27/201812/27/2018 1:09 pm Radiologist:  M. Daryll Brod, MD Guidance:  Ultrasound and fluoroscopic MEDICATIONS: Ancef 2 g; The antibiotic was administered within an appropriate time interval prior to skin puncture. ANESTHESIA/SEDATION: Versed 1.5 mg IV; Fentanyl 75 mcg IV; Moderate Sedation Time:  30 minutes The patient was continuously monitored during the procedure by the interventional radiology nurse under my direct supervision. FLUOROSCOPY TIME:  36 seconds (1 mGy) COMPLICATIONS: None immediate. CONTRAST:  None. PROCEDURE: Informed consent was obtained from the patient following explanation of the procedure, risks, benefits and alternatives. The patient understands, agrees and consents for the procedure. All questions were addressed. A time out was performed. Maximal barrier sterile technique utilized including caps, mask, sterile gowns, sterile gloves, large sterile drape, hand hygiene, and 2% chlorhexidine scrub. Under sterile conditions and local anesthesia, right internal jugular micropuncture venous access was performed. Access was performed with ultrasound. Images were obtained for documentation. A guide wire was inserted followed by a transitional dilator. This allowed insertion of a guide wire and catheter into the IVC. Measurements were obtained from the SVC / RA junction back to the right IJ venotomy site. In the right infraclavicular chest, a subcutaneous pocket was created over the second anterior rib. This was [place] under sterile conditions and  local anesthesia. 1% lidocaine with epinephrine was utilized for this. A 2.5 cm incision was made in the skin. Blunt dissection was performed to create a subcutaneous pocket over the right pectoralis major muscle. The pocket was flushed with saline vigorously. There was adequate hemostasis. The port catheter was assembled and checked for leakage. The port catheter was secured in the pocket with two retention sutures. The tubing was tunneled subcutaneously to the right venotomy site and inserted into the SVC/RA junction through a valved peel-away sheath. Position was confirmed with fluoroscopy. Images were obtained for documentation. The patient tolerated the procedure well. No immediate complications. Incisions were closed in a two layer fashion with 4 - 0 Vicryl suture. Dermabond was applied to the skin. The port catheter was accessed, blood was aspirated followed by saline and heparin flushes. Needle was removed. A dry sterile dressing was applied. IMPRESSION: Ultrasound and fluoroscopically guided right internal jugular single lumen power port catheter insertion. Tip in the SVC/RA junction. Catheter ready for use. Electronically Signed   By: Jerilynn Mages.  Shick M.D.   On: 07/05/2017 13:14   Ir Fluoro Guide Port Insertion Right  Result Date: 07/05/2017 CLINICAL DATA:  GASTRIC CANCER, ACCESS FOR CHEMOTHERAPY EXAM: RIGHT INTERNAL JUGULAR SINGLE LUMEN POWER PORT CATHETER INSERTION Date:  12/27/201812/27/2018 1:09 pm Radiologist:  M. Daryll Brod, MD Guidance:  Ultrasound and fluoroscopic MEDICATIONS: Ancef 2 g; The antibiotic was administered within an appropriate time interval prior to skin puncture. ANESTHESIA/SEDATION: Versed 1.5 mg IV; Fentanyl 75 mcg IV; Moderate Sedation Time:  30 minutes The patient was continuously monitored during the procedure by the interventional radiology nurse under my direct supervision. FLUOROSCOPY TIME:  36 seconds (1 mGy) COMPLICATIONS: None immediate. CONTRAST:  None. PROCEDURE:  Informed consent was obtained from the patient following explanation of the procedure, risks, benefits and alternatives. The patient understands, agrees and consents for the procedure. All questions were addressed. A time out was performed. Maximal barrier sterile technique utilized including caps, mask, sterile gowns, sterile gloves, large sterile drape, hand hygiene, and 2% chlorhexidine scrub. Under sterile conditions and local anesthesia, right internal jugular micropuncture venous access was performed. Access was performed with ultrasound. Images were  obtained for documentation. A guide wire was inserted followed by a transitional dilator. This allowed insertion of a guide wire and catheter into the IVC. Measurements were obtained from the SVC / RA junction back to the right IJ venotomy site. In the right infraclavicular chest, a subcutaneous pocket was created over the second anterior rib. This was Caldwell under sterile conditions and local anesthesia. 1% lidocaine with epinephrine was utilized for this. A 2.5 cm incision was made in the skin. Blunt dissection was performed to create a subcutaneous pocket over the right pectoralis major muscle. The pocket was flushed with saline vigorously. There was adequate hemostasis. The port catheter was assembled and checked for leakage. The port catheter was secured in the pocket with two retention sutures. The tubing was tunneled subcutaneously to the right venotomy site and inserted into the SVC/RA junction through a valved peel-away sheath. Position was confirmed with fluoroscopy. Images were obtained for documentation. The patient tolerated the procedure well. No immediate complications. Incisions were closed in a two layer fashion with 4 - 0 Vicryl suture. Dermabond was applied to the skin. The port catheter was accessed, blood was aspirated followed by saline and heparin flushes. Needle was removed. A dry sterile dressing was applied. IMPRESSION: Ultrasound and  fluoroscopically guided right internal jugular single lumen power port catheter insertion. Tip in the SVC/RA junction. Catheter ready for use. Electronically Signed   By: Jerilynn Mages.  Shick M.D.   On: 07/05/2017 13:14    CT CAP W Contrast 06/22/17  IMPRESSION: 1. The gastric mass is somewhat inconspicuous on CT, but a potential 1.2 cm thick mucosal masses seen along the posterior gastric border on image 83/2. There several small adjacent right gastric lymph nodes measuring up to 0.7 cm in short axis, but no overtly pathologic adenopathy and no findings of hepatic or peritoneal metastatic disease at this time. 2. Calcified granulomas in both lungs along with several noncalcified nodules stable from August 2016 and likely benign given this long-term stability. 3. Other imaging findings of potential clinical significance: Aortic Atherosclerosis (ICD10-I70.0) and Emphysema (ICD10-J43.9). Airway thickening is present, suggesting bronchitis or reactive airways disease. 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 Disc bulges at L3-4 and L4-5.   CT CAP 04/02/17 IMPRESSION: 1. Aortic atherosclerosis with fusiform ectasia of the infrarenal abdominal aorta which measures up to 2.6 x 2.3 cm, and contains a large burden of mural thrombus. 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 2. Today's study was not a CTA examination, which limits assessment of vascular abnormalities. Additionally, secondary to extensive patient respiratory motion, accurate assessment of small vessels is limited. With these limitations in mind, there is potential stenosis of both the origin of the celiac axis and the origin of the inferior mesenteric artery. Given the patient's history of intermittent abdominal pain, further evaluation with CTA of the abdomen and pelvis  should be considered if clinically appropriate, and if the patient can be counseled to adequately hold his breath during the examination to allow for optimal image quality. 3. No other acute findings are noted in the chest, abdomen or pelvis to account for the patient's symptoms. 4. Diffuse bronchial wall thickening with moderate centrilobular and paraseptal emphysema; imaging findings suggestive of underlying COPD. 5. Multiple small pulmonary nodules measuring up to 6 mm, stable compared to prior examinations dating back take 02/22/2015, considered benign. No future follow-up needed.  This recommendation follows the consensus statement: Guidelines for Management of Incidental Pulmonary Nodules Detected on CT Images: From the Fleischner Society 2017; Radiology 2017; 284:228-243. 6. Additional incidental findings, as above. Aortic Atherosclerosis (ICD10-I70.0) and Emphysema (ICD10-J43.9).   ASSESSMENT & PLAN:  Bobby Caldwell is a 61 y.o. [place] male with a history of heavy smoking and COPD, peripheral vascular disease, presents with fatigue, anorexia, early satiety and weight loss   1. Gastric Adenocarcinoma, uT2N0M0, stage I -I reviewed his EGD findings and biopsy pathology results that show gastric adenocarcinoma. He had CT chest, abdomen and pelvis with contrast for his peripheral vascular disease on 04/02/2017, which shows no sign of metastasis at that time.  -I will repeat staging scan to rule out new distant metastasis since last scan.  -His EUS showed a T2N0 lesion in distal gastric body -Due to his intensive peripheral vascular disease and recent bypass surgery, poor nutrition status, Dr. [name] not feel he is a great surgical candidate at this point, she recommended him to improve his nutrition status and to consider neoadjuvant chemotherapy first.  Giving the T2 lesion, neoadjuvant chemotherapy is reasonable and I agree with Dr. [name]. -The period chemo FLOT4 (5-fu, oxaliplatin, and  docetaxel) has showed superior outcome for gastric cancer. However, due to his poor nutrition status, comorbidities, and poor performance status, he may not be able to tolerate such intensive chemotherapy.  I will consider FOLFOX as the first cycle of neoadjuvant chemo, his nutrition status improved and he tolerates well, will consider change to FLOT4 -His cardiac workup with Dr. Ellyn Hack has been completed.  He has low possibility of cardiac ischemia and he is cleared to proceed with chemotherapy -He had port placed yesterday, he has some erythema from placement and some pain after procedure. He had taken tylenol but was not enough. I prescribed him tramadol for the pain. Will monitor area.   -We reviewed his CT CAP from 06/22/17 showed no other metastatic disease.  -He will proceed with FOLFOX with leucovorin every 2 weeks for 6 cycles starting 07/07/17. Will start with dose reduction for cycle 1 due to his limited performance status and nutrition status. If tolerable will increase to full dose cycle 2.  --Chemotherapy consent: Side effects including but does not not limited to, fatigue, nausea, vomiting, diarrhea, hair loss, neuropathy, fluid retention, renal and kidney dysfunction, neutropenic fever, needed for blood transfusion, bleeding, coronary artery spasm, heart attack and heart failure were discussed with patient in great detail. he agrees to proceed. -The goal of therapy is curative -I called in EMLA cream Zofran and compazine today  -He missed his chemo class yesterday, will do it today -First cycle of FOLFOX tomorrow -F/u in 2 weeks, I encouraged him to see me sooner if he develops significant or unexpected side effects.    2. Anorexia, nausea and weight loss  -He has loss 14 pounds over past 6 months  -I previously set up nutrition consult -I called in compazine for his nausea  -I will discuss J-tube placement with Dr. [name]   -He will see dietician on 12/17.  -He currently takes  ensure boosts daily.  -He has been gaining weight recently. I encouraged him to continue with supplement to gain more weight.   3. COPD, Smoking Cessation  -He reduced from 50 cigars a day to 10 cigars a day. He is actively trying to quit.  -I discussed his chest pain is due to his COPD and smoking.   4. Financial support -I will refer  him to our Education officer, museum for help with financial help, he is no longer able to work and would like apply for disability.   -His wife has not returned to work to help him at home. I recommend she apply for FMLA at her place of work.  -He will need assistance with transportation.  -will set up SW consultation    PLAN:  -Prescribe Tramadol, EMLA cream, Zofran and compazine today  -Chemo class today  -Set up SW consultation for transportation assistance, social benefits application, etc. -Lab, flush, f/u and chemo FOLFOX 1/10 and 1/25    No orders of the defined types were placed in this encounter.   All questions were answered. The patient knows to call the clinic with any problems, questions or concerns. I spent 25 minutes counseling the patient face to face. The total time spent in the appointment was 30 minutes and more than 50% was on counseling.     [name], MD 07/06/2017   This document serves as a record of services personally performed by [name], MD. It was created on her behalf by Joslyn Devon, a trained medical scribe. The creation of this record is based on the scribe's personal observations and the provider's statements to them.    I have reviewed the above documentation for accuracy and completeness, and I agree with the above.

## 2017-07-07 ENCOUNTER — Other Ambulatory Visit: Payer: Self-pay | Admitting: Hematology

## 2017-07-07 ENCOUNTER — Encounter: Payer: Self-pay | Admitting: Hematology

## 2017-07-07 ENCOUNTER — Ambulatory Visit (HOSPITAL_BASED_OUTPATIENT_CLINIC_OR_DEPARTMENT_OTHER): Payer: BLUE CROSS/BLUE SHIELD

## 2017-07-07 VITALS — BP 134/87 | HR 119 | Temp 97.2°F | Resp 18

## 2017-07-07 DIAGNOSIS — Z5111 Encounter for antineoplastic chemotherapy: Secondary | ICD-10-CM

## 2017-07-07 DIAGNOSIS — C169 Malignant neoplasm of stomach, unspecified: Secondary | ICD-10-CM

## 2017-07-07 DIAGNOSIS — C162 Malignant neoplasm of body of stomach: Secondary | ICD-10-CM

## 2017-07-07 MED ORDER — LORAZEPAM 0.5 MG PO TABS: 0.5000 mg | ORAL_TABLET | Freq: Four times a day (QID) | ORAL | 0 refills | Status: DC | PRN

## 2017-07-07 MED ORDER — LEUCOVORIN CALCIUM INJECTION 350 MG
400.0000 mg/m2 | Freq: Once | INTRAMUSCULAR | Status: AC
  Administered 2017-07-07: 616 mg via INTRAVENOUS
  Filled 2017-07-07: qty 30.8

## 2017-07-07 MED ORDER — SODIUM CHLORIDE 0.9 % IV SOLN
2000.0000 mg/m2 | INTRAVENOUS | Status: DC
  Administered 2017-07-07: 3100 mg via INTRAVENOUS
  Filled 2017-07-07: qty 62

## 2017-07-07 MED ORDER — DEXAMETHASONE 4 MG PO TABS: 8.0000 mg | ORAL_TABLET | Freq: Every day | ORAL | 1 refills | Status: DC

## 2017-07-07 MED ORDER — DEXTROSE 5 % IV SOLN
Freq: Once | INTRAVENOUS | Status: AC
  Administered 2017-07-07: 09:00:00 via INTRAVENOUS

## 2017-07-07 MED ORDER — OXALIPLATIN CHEMO INJECTION 100 MG/20ML
70.0000 mg/m2 | Freq: Once | INTRAVENOUS | Status: AC
  Administered 2017-07-07: 110 mg via INTRAVENOUS
  Filled 2017-07-07: qty 20

## 2017-07-07 MED ORDER — PALONOSETRON HCL INJECTION 0.25 MG/5ML
INTRAVENOUS | Status: AC
  Filled 2017-07-07: qty 5

## 2017-07-07 MED ORDER — PALONOSETRON HCL INJECTION 0.25 MG/5ML
0.2500 mg | Freq: Once | INTRAVENOUS | Status: AC
  Administered 2017-07-07: 0.25 mg via INTRAVENOUS

## 2017-07-07 NOTE — Progress Notes (Signed)
Per Dr Burr Medico ok to tx today with increased HR other VSS. Dr Burr Medico saw pt yesterday and dosed reduces tx for today. Per Dr Burr Medico yesterday pt complained of pain around new PAC site. RN to monitor site for s/s of infection today.

## 2017-07-07 NOTE — Patient Instructions (Signed)
[facility] Discharge Instructions for Patients Receiving Chemotherapy  Today you received the following chemotherapy agents :  Oxaliplatin,  Leucovorin, Adrucil.  To help prevent nausea and vomiting after your treatment, we encourage you to take your nausea medication as prescribed.   If you develop nausea and vomiting that is not controlled by your nausea medication, call the clinic.   BELOW ARE SYMPTOMS THAT SHOULD BE REPORTED IMMEDIATELY:  *FEVER GREATER THAN 100.5 F  *CHILLS WITH OR WITHOUT FEVER  NAUSEA AND VOMITING THAT IS NOT CONTROLLED WITH YOUR NAUSEA MEDICATION  *UNUSUAL SHORTNESS OF BREATH  *UNUSUAL BRUISING OR BLEEDING  TENDERNESS IN MOUTH AND THROAT WITH OR WITHOUT PRESENCE OF ULCERS  *URINARY PROBLEMS  *BOWEL PROBLEMS  UNUSUAL RASH Items with * indicate a potential emergency and should be followed up as soon as possible.  Feel free to call the clinic should you have any questions or concerns. The clinic phone number is [phone].  Please show the CHEMO ALERT CARD at check-in to the Emergency Department and triage nurse.   

## 2017-07-09 ENCOUNTER — Ambulatory Visit (HOSPITAL_BASED_OUTPATIENT_CLINIC_OR_DEPARTMENT_OTHER): Payer: BLUE CROSS/BLUE SHIELD

## 2017-07-09 ENCOUNTER — Encounter: Payer: Self-pay | Admitting: *Deleted

## 2017-07-09 VITALS — BP 144/91 | HR 91 | Temp 98.3°F | Resp 20

## 2017-07-09 DIAGNOSIS — Z95828 Presence of other vascular implants and grafts: Secondary | ICD-10-CM

## 2017-07-09 DIAGNOSIS — C169 Malignant neoplasm of stomach, unspecified: Secondary | ICD-10-CM

## 2017-07-09 DIAGNOSIS — C160 Malignant neoplasm of cardia: Secondary | ICD-10-CM

## 2017-07-09 MED ORDER — SODIUM CHLORIDE 0.9% FLUSH
10.0000 mL | Freq: Once | INTRAVENOUS | Status: AC
  Administered 2017-07-09: 10 mL
  Filled 2017-07-09: qty 10

## 2017-07-09 MED ORDER — HEPARIN SOD (PORK) LOCK FLUSH 100 UNIT/ML IV SOLN
500.0000 [IU] | Freq: Once | INTRAVENOUS | Status: AC
  Administered 2017-07-09: 500 [IU]
  Filled 2017-07-09: qty 5

## 2017-07-09 NOTE — Progress Notes (Signed)
[facility] Clinical Social Work  Holiday representative received referral from Futures trader for psychosocial assessment and resources.  CSW plans to meet with patient on 07/11/17 following his appointment with medical oncologist.  CSW made RN aware.  CSW also contacted patient and left a voicemail with above information.  CSW encouraged patient to call with questions or concerns.    Johnnye Lana, MSW, LCSW, OSW-C Clinical Social Worker [facility] [phone]

## 2017-07-11 ENCOUNTER — Telehealth: Payer: Self-pay | Admitting: *Deleted

## 2017-07-11 ENCOUNTER — Encounter: Payer: Self-pay | Admitting: *Deleted

## 2017-07-11 ENCOUNTER — Encounter: Payer: Self-pay | Admitting: Hematology

## 2017-07-11 ENCOUNTER — Ambulatory Visit (HOSPITAL_BASED_OUTPATIENT_CLINIC_OR_DEPARTMENT_OTHER): Payer: BLUE CROSS/BLUE SHIELD

## 2017-07-11 ENCOUNTER — Ambulatory Visit (HOSPITAL_BASED_OUTPATIENT_CLINIC_OR_DEPARTMENT_OTHER): Payer: BLUE CROSS/BLUE SHIELD | Admitting: Hematology

## 2017-07-11 ENCOUNTER — Other Ambulatory Visit (HOSPITAL_BASED_OUTPATIENT_CLINIC_OR_DEPARTMENT_OTHER): Payer: BLUE CROSS/BLUE SHIELD

## 2017-07-11 ENCOUNTER — Telehealth: Payer: Self-pay | Admitting: Hematology

## 2017-07-11 VITALS — BP 138/98 | HR 92 | Temp 97.7°F | Resp 18 | Ht 66.0 in | Wt 115.3 lb

## 2017-07-11 VITALS — BP 135/87 | HR 85

## 2017-07-11 DIAGNOSIS — R630 Anorexia: Secondary | ICD-10-CM | POA: Diagnosis not present

## 2017-07-11 DIAGNOSIS — R451 Restlessness and agitation: Secondary | ICD-10-CM

## 2017-07-11 DIAGNOSIS — C160 Malignant neoplasm of cardia: Secondary | ICD-10-CM

## 2017-07-11 DIAGNOSIS — C169 Malignant neoplasm of stomach, unspecified: Secondary | ICD-10-CM

## 2017-07-11 DIAGNOSIS — Z720 Tobacco use: Secondary | ICD-10-CM | POA: Diagnosis not present

## 2017-07-11 DIAGNOSIS — R110 Nausea: Secondary | ICD-10-CM

## 2017-07-11 DIAGNOSIS — J449 Chronic obstructive pulmonary disease, unspecified: Secondary | ICD-10-CM

## 2017-07-11 DIAGNOSIS — Z95828 Presence of other vascular implants and grafts: Secondary | ICD-10-CM

## 2017-07-11 DIAGNOSIS — C162 Malignant neoplasm of body of stomach: Secondary | ICD-10-CM

## 2017-07-11 DIAGNOSIS — R634 Abnormal weight loss: Secondary | ICD-10-CM

## 2017-07-11 DIAGNOSIS — I10 Essential (primary) hypertension: Secondary | ICD-10-CM

## 2017-07-11 DIAGNOSIS — R636 Underweight: Secondary | ICD-10-CM

## 2017-07-11 DIAGNOSIS — J431 Panlobular emphysema: Secondary | ICD-10-CM

## 2017-07-11 LAB — CBC WITH DIFFERENTIAL/PLATELET
BASO%: 0.8 % (ref 0.0–2.0)
Basophils Absolute: 0.1 10*3/uL (ref 0.0–0.1)
EOS%: 2.7 % (ref 0.0–7.0)
Eosinophils Absolute: 0.2 10*3/uL (ref 0.0–0.5)
HCT: 43.7 % (ref 38.4–49.9)
HGB: 15 g/dL (ref 13.0–17.1)
LYMPH%: 18.6 % (ref 14.0–49.0)
MCH: 29.8 pg (ref 27.2–33.4)
MCHC: 34.3 g/dL (ref 32.0–36.0)
MCV: 86.7 fL (ref 79.3–98.0)
MONO#: 0.6 10*3/uL (ref 0.1–0.9)
MONO%: 7.4 % (ref 0.0–14.0)
NEUT#: 5.3 10*3/uL (ref 1.5–6.5)
NEUT%: 70.5 % (ref 39.0–75.0)
Platelets: 165 10*3/uL (ref 140–400)
RBC: 5.04 10*6/uL (ref 4.20–5.82)
RDW: 12.8 % (ref 11.0–14.6)
WBC: 7.5 10*3/uL (ref 4.0–10.3)
lymph#: 1.4 10*3/uL (ref 0.9–3.3)

## 2017-07-11 LAB — COMPREHENSIVE METABOLIC PANEL
ALBUMIN: 3.6 g/dL (ref 3.5–5.0)
ALK PHOS: 116 U/L (ref 40–150)
ALT: 17 U/L (ref 0–55)
AST: 15 U/L (ref 5–34)
Anion Gap: 7 mEq/L (ref 3–11)
BILIRUBIN TOTAL: 0.54 mg/dL (ref 0.20–1.20)
BUN: 9.8 mg/dL (ref 7.0–26.0)
CALCIUM: 9 mg/dL (ref 8.4–10.4)
CO2: 32 mEq/L — ABNORMAL HIGH (ref 22–29)
Chloride: 99 mEq/L (ref 98–109)
Creatinine: 0.7 mg/dL (ref 0.7–1.3)
EGFR: >60 mL/min/{1.73_m2} (ref >60)
Glucose: 109 mg/dl (ref 70–140)
Potassium: 3.5 mEq/L (ref 3.5–5.1)
Sodium: 139 mEq/L (ref 136–145)
TOTAL PROTEIN: 7.1 g/dL (ref 6.4–8.3)

## 2017-07-11 MED ORDER — MIRTAZAPINE 7.5 MG PO TABS: 7.5000 mg | ORAL_TABLET | Freq: Every day | ORAL | 1 refills | Status: DC

## 2017-07-11 MED ORDER — SODIUM CHLORIDE 0.9 % IV SOLN
Freq: Once | INTRAVENOUS | Status: AC
  Administered 2017-07-11: 11:00:00 via INTRAVENOUS

## 2017-07-11 MED ORDER — ONDANSETRON HCL 4 MG/2ML IJ SOLN
INTRAMUSCULAR | Status: AC
  Filled 2017-07-11: qty 4

## 2017-07-11 MED ORDER — HEPARIN SOD (PORK) LOCK FLUSH 100 UNIT/ML IV SOLN
250.0000 [IU] | Freq: Once | INTRAVENOUS | Status: AC | PRN
  Administered 2017-07-11: 250 [IU]
  Filled 2017-07-11: qty 5

## 2017-07-11 MED ORDER — DEXAMETHASONE SODIUM PHOSPHATE 10 MG/ML IJ SOLN
10.0000 mg | Freq: Once | INTRAMUSCULAR | Status: AC
  Administered 2017-07-11: 10 mg via INTRAVENOUS

## 2017-07-11 MED ORDER — ONDANSETRON HCL 4 MG/2ML IJ SOLN
8.0000 mg | Freq: Once | INTRAMUSCULAR | Status: AC
  Administered 2017-07-11: 8 mg via INTRAVENOUS

## 2017-07-11 MED ORDER — SODIUM CHLORIDE 0.9 % IV SOLN: Freq: Once | INTRAVENOUS | Status: DC

## 2017-07-11 MED ORDER — SODIUM CHLORIDE 0.9% FLUSH
10.0000 mL | INTRAVENOUS | Status: DC | PRN
  Administered 2017-07-11: 10 mL
  Filled 2017-07-11: qty 10

## 2017-07-11 MED ORDER — DEXAMETHASONE SODIUM PHOSPHATE 10 MG/ML IJ SOLN
INTRAMUSCULAR | Status: AC
  Filled 2017-07-11: qty 1

## 2017-07-11 NOTE — Progress Notes (Signed)
Bobby Caldwell  Holiday representative received referral from Therapist, sports for transportation and financial concerns.  CSW met with patient in the infusion room to offer support and assess for needs.  CSW and patient explored resources for transportation. ([employer], road to recovery, senior wheels)  Patient declined referrals to transportation resources at this time.  Patient also requested assistance with his FMLA paperwork.  CSW provided RN with FMLA information, and patient will be contacted once complete.  CSW also encouraged patient to meet with the financial advocate to apply for the [place].  CSW scheduled an appointment for patient to meet with the financial advocate at 9:30 on 07/24/17.  Patient verbalized understanding and was appreciative of CSW contact.  CSW provided contact information and encouraged patient to call with questions or concerns.        Johnnye Lana, MSW, LCSW, OSW-C Clinical Social Worker [facility] [phone]

## 2017-07-11 NOTE — Patient Instructions (Signed)
Dehydration, Adult Dehydration is a condition in which there is not enough fluid or water in the body. This happens when you lose more fluids than you take in. Important organs, such as the kidneys, brain, and heart, cannot function without a proper amount of fluids. Any loss of fluids from the body can lead to dehydration. Dehydration can range from mild to severe. This condition should be treated right away to prevent it from becoming severe. What are the causes? This condition may be caused by:  Vomiting.  Diarrhea.  Excessive sweating, such as from heat exposure or exercise.  Not drinking enough fluid, especially: ? When ill. ? While doing activity that requires a lot of energy.  Excessive urination.  Fever.  Infection.  Certain medicines, such as medicines that cause the body to lose excess fluid (diuretics).  Inability to access safe drinking water.  Reduced physical ability to get adequate water and food.  What increases the risk? This condition is more likely to develop in people:  Who have a poorly controlled long-term (chronic) illness, such as diabetes, heart disease, or kidney disease.  Who are age 65 or older.  Who are disabled.  Who live in a place with high altitude.  Who play endurance sports.  What are the signs or symptoms? Symptoms of mild dehydration may include:  Thirst.  Dry lips.  Slightly dry mouth.  Dry, warm skin.  Dizziness. Symptoms of moderate dehydration may include:  Very dry mouth.  Muscle cramps.  Dark urine. Urine may be the color of tea.  Decreased urine production.  Decreased tear production.  Heartbeat that is irregular or faster than normal (palpitations).  Headache.  Light-headedness, especially when you stand up from a sitting position.  Fainting (syncope). Symptoms of severe dehydration may include:  Changes in skin, such as: ? Cold and clammy skin. ? Blotchy (mottled) or pale skin. ? Skin that does  not quickly return to normal after being lightly pinched and released (poor skin turgor).  Changes in body fluids, such as: ? Extreme thirst. ? No tear production. ? Inability to sweat when body temperature is high, such as in hot weather. ? Very little urine production.  Changes in vital signs, such as: ? Weak pulse. ? Pulse that is more than 100 beats a minute when sitting still. ? Rapid breathing. ? Low blood pressure.  Other changes, such as: ? Sunken eyes. ? Cold hands and feet. ? Confusion. ? Lack of energy (lethargy). ? Difficulty waking up from sleep. ? Short-term weight loss. ? Unconsciousness. How is this diagnosed? This condition is diagnosed based on your symptoms and a physical exam. Blood and urine tests may be done to help confirm the diagnosis. How is this treated? Treatment for this condition depends on the severity. Mild or moderate dehydration can often be treated at home. Treatment should be started right away. Do not wait until dehydration becomes severe. Severe dehydration is an emergency and it needs to be treated in a hospital. Treatment for mild dehydration may include:  Drinking more fluids.  Replacing salts and minerals in your blood (electrolytes) that you may have lost. Treatment for moderate dehydration may include:  Drinking an oral rehydration solution (ORS). This is a drink that helps you replace fluids and electrolytes (rehydrate). It can be found at pharmacies and retail stores. Treatment for severe dehydration may include:  Receiving fluids through an IV tube.  Receiving an electrolyte solution through a feeding tube that is passed through your nose   and into your stomach (nasogastric tube, or NG tube).  Correcting any abnormalities in electrolytes.  Treating the underlying cause of dehydration. Follow these instructions at home:  If directed by your health care provider, drink an ORS: ? Make an ORS by following instructions on the  package. ? Start by drinking small amounts, about  cup (120 mL) every 5-10 minutes. ? Slowly increase how much you drink until you have taken the amount recommended by your health care provider.  Drink enough clear fluid to keep your urine clear or pale yellow. If you were told to drink an ORS, finish the ORS first, then start slowly drinking other clear fluids. Drink fluids such as: ? Water. Do not drink only water. Doing that can lead to having too little salt (sodium) in the body (hyponatremia). ? Ice chips. ? Fruit juice that you have added water to (diluted fruit juice). ? Low-calorie sports drinks.  Avoid: ? Alcohol. ? Drinks that contain a lot of sugar. These include high-calorie sports drinks, fruit juice that is not diluted, and soda. ? Caffeine. ? Foods that are greasy or contain a lot of fat or sugar.  Take over-the-counter and prescription medicines only as told by your health care provider.  Do not take sodium tablets. This can lead to having too much sodium in the body (hypernatremia).  Eat foods that contain a healthy balance of electrolytes, such as bananas, oranges, potatoes, tomatoes, and spinach.  Keep all follow-up visits as told by your health care provider. This is important. Contact a health care provider if:  You have abdominal pain that: ? Gets worse. ? Stays in one area (localizes).  You have a rash.  You have a stiff neck.  You are more irritable than usual.  You are sleepier or more difficult to wake up than usual.  You feel weak or dizzy.  You feel very thirsty.  You have urinated only a small amount of very dark urine over 6-8 hours. Get help right away if:  You have symptoms of severe dehydration.  You cannot drink fluids without vomiting.  Your symptoms get worse with treatment.  You have a fever.  You have a severe headache.  You have vomiting or diarrhea that: ? Gets worse. ? Does not go away.  You have blood or green matter  (bile) in your vomit.  You have blood in your stool. This may cause stool to look black and tarry.  You have not urinated in 6-8 hours.  You faint.  Your heart rate while sitting still is over 100 beats a minute.  You have trouble breathing. This information is not intended to replace advice given to you by your health care provider. Make sure you discuss any questions you have with your health care provider. Document Released: 06/26/2005 Document Revised: 01/21/2016 Document Reviewed: 08/20/2015 Elsevier Interactive Patient Education  2018 [employer]  

## 2017-07-11 NOTE — Progress Notes (Signed)
[name]  [phone] [phone]  Clinic Follow Up Note   Patient Care Team: Minette Brine as PCP - General (General Practice) [name], MD as Consulting Physician (Pulmonary Disease) [name], MD as Consulting Physician (Hematology) [name], MD as Attending Physician (Gastroenterology) [name], MD as Consulting Physician (General Surgery)   Date of Service: 07/11/2017   CHIEF COMPLAINTS:  Follow up gastric Adenocarcinoma   Oncology History   Cancer Staging Gastric cancer [facility]) Staging form: Stomach, AJCC 8th Edition - Clinical stage from 05/24/2017: Stage I (cT2, cN0, cM0) - Signed by [name], MD on 06/19/2017       Gastric cancer ([place])   05/24/2017 Procedure    EGD by Dr. [name] 05/24/17 IMPRESSION Ulcerated mass like region in the distal stomach (4cm across), extensively biopsied. This appears neoplastic. - Abnormal mucosa throughout stomach otherwise (mild-moderate gastritis), also extensively biopsied.      05/24/2017 Initial Biopsy    Diagnosis 05/24/17  1. Stomach, biopsy, mass distal - ADENOCARCINOMA, SEE COMMENT. - CHRONIC ACTIVE GASTRITIS WITH INTESTINAL METAPLASIA. 2. 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 - NO DYSPLASIA OR MALIGNANCY      06/04/2017 Initial Diagnosis    Gastric cancer ([place])      06/07/2017 Procedure    EUS by Dr. [name] on 06/07/17  IMPRESSION:  5cm across, partially circumferential uT2N0 gastric adenocarcinoma laying in the distal stomach 3-4cm proximal to the pylorus.      06/22/2017 Imaging    CT CAP W Contrast 06/22/17  IMPRESSION: 1. The gastric mass is somewhat inconspicuous on CT, but a potential 1.2 cm thick mucosal masses seen along the posterior gastric border on image 83/2. There several small adjacent right gastric lymph nodes measuring up to 0.7 cm in short axis, but no  overtly pathologic adenopathy and no findings of hepatic or peritoneal metastatic disease at this time. 2. Calcified granulomas in both lungs along with several noncalcified nodules stable from August 2016 and likely benign given this long-term stability. 3. Other imaging findings of potential clinical significance: Aortic Atherosclerosis (ICD10-I70.0) and Emphysema (ICD10-J43.9). Airway thickening is present, suggesting bronchitis or reactive airways disease. 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 Disc bulges at L3-4 and L4-5.      07/05/2017 Surgery    PAC placement       07/07/2017 -  Chemotherapy    Neoadjuvant FOLFOX with with leucovorin every 2 weeks for 6 cycles starting 07/07/17. Will start with reduced dose for cycle 1.         HISTORY OF PRESENTING ILLNESS: 06/05/17  Bobby Caldwell 62 y.o. male is here because of newly diagnosed Gastric Cancer. He was referred by Dr. [name]. He presents to the clinic today accompanied by an albanian interpretor and then helped by video interpretor [phone] to help translate his visit.   In the past he was diagnosed with COPD controlled with inhalers. He has smoked for several years and is down to smoking 10 cigars a day. He is actively trying to quit.   Today he notes he is upset about his diagnosis and he recently had bypass surgery on his bilateral legs in 04/2017 and feels fatigued from this. He met with Dr. [name] today and he was told he will need chemo and surgery. He is willing to go with whatever is recommended.  He notes this started with issues with  both legs feeling weak and shaking. This occurred for 2-3 months. He had bypass surgery with Dr. Bridgett Larsson. He does not walk much so he has not tested how well he can walk much. The 03/2017 CT scan before his surgery showed thickening in his stomach which is why he was referred to Dr. [name]. Patient notes his stomach  issues has been ongoing, but recently has burning sensation below umbilical area since endoscopy. This was only mildly presents prior to procedure. His appetite has not changed much. He doesn't dare to eat much as he is afraid to vomit. He has felt this way for th past 6 months. He currently loss 14 pounds in the past 6 months. He does not go out much anymore, he mostly stays in his house. He is able to do ALDs himself, his wife helps out around the house and cares for him. He has 4 children, 37-21yo. His wife does not work. He does not find himself feeling up to working anymore, he has no energy to do anything.  He notes pain in his chest from his COPD. He uses inhalers 3 times a day. He would like to do the surgery alone but if chemo is needed he is fine with proceeding with treatment. He reports his body shakes when he gets blood drawn. He is willing to try oral iron in the meantime. He would like to be seen by social worker to help him our with finances as he is not able to work. He feels he often has to break due to sever fatigue and weakness.     CURRENT THERAPY: neoadjuvant FOLFOX with leucovorin every 2 weeks for 6 cycles starting 07/07/17    INTERVAL HISTORY:  Bobby Caldwell is here for a follow up after cycle 1 of FOLFOX. He presents to the clinic today noting he did not tolerate first cycle well. He did not eat much as this made him throw up. He reports to having 2 glasses of water a day. He has been taking zofran and compazine 2 tabs BID. He notes to having 3 daughters, one of which returned to [place]. He denies diarrhea, abdominal pain or having mouth sores. He is willing to have IV Fluids today.      MEDICAL HISTORY:  Past Medical History:  Diagnosis Date  . COPD (chronic obstructive pulmonary disease) ([place])   . Dyspnea   . PAD (peripheral artery disease) ([place])   . Stomach cancer ([place])   . Tobacco abuse   . Weight loss     SURGICAL HISTORY: Past Surgical History:  Procedure  Laterality Date  . ABDOMINAL AORTOGRAM W/LOWER EXTREMITY N/A 05/03/2017   Procedure: ABDOMINAL AORTOGRAM W/LOWER EXTREMITY;  Surgeon: Conrad Downs, MD;  Location: [facility];  Service: Cardiovascular;  Laterality: N/A;  . ESOPHAGOGASTRODUODENOSCOPY     05/24/17  . ESOPHAGOGASTRODUODENOSCOPY (EGD) WITH PROPOFOL N/A 05/24/2017   Procedure: ESOPHAGOGASTRODUODENOSCOPY (EGD) WITH PROPOFOL;  Surgeon: [name], MD;  Location: [facility];  Service: Endoscopy;  Laterality: N/A;  . EUS N/A 06/07/2017   Procedure: UPPER ENDOSCOPIC ULTRASOUND (EUS) RADIAL;  Surgeon: [name], MD;  Location: [facility];  Service: Endoscopy;  Laterality: N/A;  . FEMORAL-FEMORAL BYPASS GRAFT Bilateral 05/08/2017   Procedure: LEFT TO RIGHT BYPASS GRAFT FEMORAL-FEMORAL ARTERY;  Surgeon: Conrad Thurston, MD;  Location: [facility];  Service: Vascular;  Laterality: Bilateral;  . IR FLUORO GUIDE PORT INSERTION RIGHT  07/05/2017  . IR US GUIDE VASC ACCESS RIGHT  07/05/2017  .  PERIPHERAL VASCULAR INTERVENTION Left 05/03/2017   Procedure: PERIPHERAL VASCULAR INTERVENTION;  Surgeon: Conrad [employer], MD;  Location: [facility];  Service: Cardiovascular;  Laterality: Left;  common iliac    SOCIAL HISTORY: Social History   Socioeconomic History  . Marital status: Married    Spouse name: Not on file  . Number of children: 4  . Years of education: Not on file  . Highest education level: Not on file  Social Needs  . Financial resource strain: Not on file  . Food insecurity - worry: Not on file  . Food insecurity - inability: Not on file  . Transportation needs - medical: Not on file  . Transportation needs - non-medical: Not on file  Occupational History  . Not on file  Tobacco Use  . Smoking status: Current Every Day Smoker    Packs/day: 1.00    Years: 50.00    Pack years: 50.00    Types: Cigarettes  . Smokeless tobacco: Never Used  . Tobacco comment: 10 cigarettes per day  Substance and Sexual  Activity  . Alcohol use: No    Alcohol/week: 0.0 oz  . Drug use: No  . Sexual activity: Yes  Other Topics Concern  . Not on file  Social History Narrative  . Not on file    FAMILY HISTORY: Family History  Family history unknown: Yes    ALLERGIES:  has No Known Allergies.  MEDICATIONS:  Current Outpatient Medications  Medication Sig Dispense Refill  . Albuterol Sulfate (PROAIR RESPICLICK) 803 (90 Base) MCG/ACT AEPB Inhale 2 puffs into the lungs every 6 (six) hours as needed. (Patient taking differently: Inhale 2 puffs into the lungs every 6 (six) hours as needed (shortness of breath). ) 1 each 6  . atorvastatin (LIPITOR) 10 MG tablet Take 1 tablet (10 mg total) by mouth daily. 30 tablet 2  . budesonide-formoterol (SYMBICORT) 160-4.5 MCG/ACT inhaler Inhale 2 puffs into the lungs 2 (two) times daily. 1 Inhaler 6  . clopidogrel (PLAVIX) 75 MG tablet Take 1 tablet (75 mg total) by mouth daily. 30 tablet 11  . dexamethasone (DECADRON) 4 MG tablet Take 2 tablets (8 mg total) by mouth daily. Start the day after chemotherapy for 2 days. Take with food. 30 tablet 1  . lidocaine-prilocaine (EMLA) cream Apply to affected area once 30 g 3  . loperamide (IMODIUM A-D) 2 MG tablet Take 2 mg by mouth 4 (four) times daily as needed for diarrhea or loose stools.    Marland Kitchen LORazepam (ATIVAN) 0.5 MG tablet Take 1 tablet (0.5 mg total) by mouth every 6 (six) hours as needed (Nausea or vomiting). 30 tablet 0  . mirtazapine (REMERON) 7.5 MG tablet Take 1 tablet (7.5 mg total) by mouth at bedtime. 30 tablet 1  . ondansetron (ZOFRAN) 8 MG tablet Take 1 tablet (8 mg total) by mouth 2 (two) times daily as needed for refractory nausea / vomiting. Start on day 3 after chemotherapy. 30 tablet 1  . oxyCODONE-acetaminophen (ROXICET) 5-325 MG tablet Take 1 tablet by mouth every 6 (six) hours as needed for severe pain. 20 tablet 0  . prochlorperazine (COMPAZINE) 10 MG tablet Take 1 tablet (10 mg total) by mouth every 6  (six) hours as needed for nausea or vomiting. 30 tablet 0  . prochlorperazine (COMPAZINE) 10 MG tablet Take 1 tablet (10 mg total) by mouth every 6 (six) hours as needed (Nausea or vomiting). 30 tablet 1  . Tiotropium Bromide Monohydrate (SPIRIVA RESPIMAT) 2.5 MCG/ACT AERS  Inhale 2 puffs into the lungs daily. (Patient taking differently: Inhale 1 puff into the lungs daily. ) 1 Inhaler 6  . traMADol (ULTRAM) 50 MG tablet Take 1 tablet (50 mg total) by mouth every 6 (six) hours as needed. 30 tablet 0   No current facility-administered medications for this visit.     REVIEW OF SYSTEMS:  Constitutional: Denies fevers, chills or abnormal night sweats (+) low appetite  Eyes: Denies blurriness of vision, double vision or watery eyes Ears, nose, mouth, throat, and face: Denies mucositis or sore throat Respiratory: (+) chest pain from COPD Cardiovascular: Denies palpitation or lower extremity swelling   Gastrointestinal:  Denies heartburn or change in bowel habits (+) nausea and vomiting from treatment (+) lower abdominal pain  Skin: Denies abnormal skin rashes (+) pain from port  Lymphatics: Denies new lymphadenopathy or easy bruising Neurological:Denies numbness, tingling or new weaknesses MSK: (+) bilateral leg weakness/slight pain  Behavioral/Psych: Mood is stable, no new changes  All other systems were reviewed with the patient and are negative.   PHYSICAL EXAMINATION:  ECOG PERFORMANCE STATUS: 2 - Symptomatic, <50% confined to bed  Vitals:   07/11/17 0905  BP: (!) 138/98  Pulse: 92  Resp: 18  Temp: 97.7 F (36.5 C)  SpO2: 91%   Filed Weights   07/11/17 0905  Weight: 115 lb 4.8 oz (52.3 kg)    GENERAL:alert, no distress and comfortable SKIN: skin color, texture, turgor are normal, no rashes or significant lesions (+) mild erythema at port site, no sign of infection  EYES: normal, conjunctiva are pink and non-injected, sclera clear OROPHARYNX:no exudate, no erythema and lips,  buccal mucosa, and tongue normal  NECK: supple, thyroid normal size, non-tender, without nodularity LYMPH:  no palpable lymphadenopathy in the cervical, axillary or inguinal LUNGS: clear to auscultation and percussion with normal breathing effort (+) decreased breast sounds bilaterally.   HEART: regular rate & rhythm and no murmurs and no lower extremity edema ABDOMEN:abdomen soft, non-tender and normal bowel sounds  Musculoskeletal:no cyanosis of digits and no clubbing  PSYCH: alert & oriented x 3 with fluent speech NEURO: no focal motor/sensory deficits  LABORATORY DATA:  I have reviewed the data as listed CBC Latest Ref Rng & Units 07/11/2017 07/06/2017 07/05/2017  WBC 4.0 - 10.3 10e3/uL 7.5 7.8 9.0  Hemoglobin 13.0 - 17.1 g/dL 15.0 14.9 16.3  Hematocrit 38.4 - 49.9 % 43.7 44.5 47.9  Platelets 140 - 400 10e3/uL 165 166 196     CMP Latest Ref Rng & Units 07/11/2017 07/06/2017 07/05/2017  Glucose 70 - 140 mg/dl 109 77 102(H)  BUN 7.0 - 26.0 mg/dL 9.8 10.2 8  Creatinine 0.7 - 1.3 mg/dL 0.7 0.6(L) 0.64  Sodium 136 - 145 mEq/L 139 139 138  Potassium 3.5 - 5.1 mEq/L 3.5 3.8 3.7  Chloride 101 - 111 mmol/L - - 98(L)  CO2 22 - 29 mEq/L 32(H) 30(H) 30  Calcium 8.4 - 10.4 mg/dL 9.0 8.6 8.8(L)  Total Protein 6.4 - 8.3 g/dL 7.1 6.6 -  Total Bilirubin 0.20 - 1.20 mg/dL 0.54 0.30 -  Alkaline Phos 40 - 150 U/L 116 110 -  AST 5 - 34 U/L 15 13 -  ALT 0 - 55 U/L 17 14 -    CEA  06/19/17: 3.38 07/06/17: 2.88   PATHOLOGY  Diagnosis 05/24/17  1. Stomach, biopsy, mass distal - ADENOCARCINOMA, SEE COMMENT. - CHRONIC ACTIVE GASTRITIS WITH INTESTINAL METAPLASIA. 2. Stomach, biopsy, irregular mucosa proximal - CHRONIC ACTIVE GASTRITIS WITH INTESTINAL  METAPLASIA AND HELICOBACTER PYLORI. - NO DYSPLASIA OR MALIGNANCY. Microscopic Comment 1. The fragments are superficial and therefore assessment of invasion is hampered. Dr. Saralyn Pilar has reviewed the case. The case was called to Dr. [name] on  05/25/2017. 2. A Warthin-Starry stain is performed to determine the possibility of the presence of Helicobacter pylori. The Warthin-Starry stain is negative for organisms of Helicobacter pylori   PROCEDURES  EUS by Dr. [name] on 06/07/17  IMPRESSION:  5cm across, partially circumferential uT2N0 gastric adenocarcinoma laying in the distal stomach 3-4cm proximal to the pylorus.    EGD by Dr. [name] 05/24/17 IMPRESSION Ulcerated mass like region in the distal stomach (4cm across), extensively biopsied. This appears neoplastic. - Abnormal mucosa throughout stomach otherwise (mild-moderate gastritis), also extensively biopsied.   RADIOGRAPHIC STUDIES: I have personally reviewed the radiological images as listed and agreed with the findings in the report. Ct Chest W Contrast  Result Date: 06/22/2017 CLINICAL DATA:  Ulcerated distal gastric mass biopsied 06/07/2017 revealing cancer, staging workup. EXAM: CT CHEST, ABDOMEN, AND PELVIS WITH CONTRAST TECHNIQUE: Multidetector CT imaging of the chest, abdomen and pelvis was performed following the standard protocol during bolus administration of intravenous contrast. CONTRAST:  15m ISOVUE-300 IOPAMIDOL (ISOVUE-300) INJECTION 61% COMPARISON:  04/02/2017 FINDINGS: Despite efforts by the technologist and patient, motion artifact is present on today's exam and could not be eliminated. This reduces exam sensitivity and specificity. CT CHEST FINDINGS Cardiovascular: Intimal thickening in the descending thoracic aorta compatible with atherosclerosis. Mediastinum/Nodes: No pathologic adenopathy in the chest is identified. Small hilar and infrahilar lymph nodes are present. Lungs/Pleura: Severe centrilobular emphysema. Biapical scarring. Calcified right middle lobe nodule on image 97/6 compatible with benign granuloma. Noncalcified 0.6 by 0.5 cm right middle lobe nodule on image 116/6 stable 02/22/2015 and highly likely to be benign and incidental based on  this long-term stability. 4 mm right lower lobe nodule anteriorly on image 143/6 likewise stable from 2016 and considered benign. Bilateral airway thickening noted. 3 mm peribronchovascular nodule in the right lower lobe on image 127/6. Densely calcified granuloma in the left upper lobe on image 45/6. Other calcified granulomas are present in the left lung. Musculoskeletal: Unremarkable CT ABDOMEN PELVIS FINDINGS Hepatobiliary: No findings of metastatic disease to the liver. The gallbladder unremarkable. No significant biliary dilatation. Pancreas: Unremarkable Spleen: Unremarkable Adrenals/Urinary Tract: Unremarkable Stomach/Bowel: The gastric mass is surprisingly inconspicuous on CT, but may be present posteriorly along the gastric wall on image 83/2. This area is difficult to compare on the prior exam because motion artifact caused blurring of this portion of the stomach previously. Mild prominence of stool in the proximal half of the colon but not the distal half. 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 Aortoiliac atherosclerotic vascular disease. Occluded right common iliac artery with patent femoral-femoral bypass and resulting contrast opacification in the external and internal iliac arteries on the right. There are several clustered right gastric lymph nodes, the largest is 7 mm in short axis on image 68/2. These were blurred on the prior exam due to motion artifact. No overtly pathologic regional adenopathy is identified. Reproductive: Unremarkable Other: No supplemental non-categorized findings. Musculoskeletal: Disc bulges potentially causing central narrowing of the thecal sac at L3-4 and especially L4-5. IMPRESSION: 1. The gastric mass is somewhat inconspicuous on CT, but a potential 1.2 cm thick mucosal masses seen along the posterior gastric border on image 83/2. There several small adjacent right gastric lymph nodes measuring up to 0.7 cm in short axis, but no overtly  pathologic  adenopathy and no findings of hepatic or peritoneal metastatic disease at this time. 2. Calcified granulomas in both lungs along with several noncalcified nodules stable from August 2016 and likely benign given this long-term stability. 3. Other imaging findings of potential clinical significance: Aortic Atherosclerosis (ICD10-I70.0) and Emphysema (ICD10-J43.9). Airway thickening is present, suggesting bronchitis or reactive airways disease. 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 Disc bulges at L3-4 and L4-5. Electronically Signed   By: Van Clines M.D.   On: 06/22/2017 11:11   Ct Abdomen Pelvis W Contrast  Result Date: 06/22/2017 CLINICAL DATA:  Ulcerated distal gastric mass biopsied 06/07/2017 revealing cancer, staging workup. EXAM: CT CHEST, ABDOMEN, AND PELVIS WITH CONTRAST TECHNIQUE: Multidetector CT imaging of the chest, abdomen and pelvis was performed following the standard protocol during bolus administration of intravenous contrast. CONTRAST:  163m ISOVUE-300 IOPAMIDOL (ISOVUE-300) INJECTION 61% COMPARISON:  04/02/2017 FINDINGS: Despite efforts by the technologist and patient, motion artifact is present on today's exam and could not be eliminated. This reduces exam sensitivity and specificity. CT CHEST FINDINGS Cardiovascular: Intimal thickening in the descending thoracic aorta compatible with atherosclerosis. Mediastinum/Nodes: No pathologic adenopathy in the chest is identified. Small hilar and infrahilar lymph nodes are present. Lungs/Pleura: Severe centrilobular emphysema. Biapical scarring. Calcified right middle lobe nodule on image 97/6 compatible with benign granuloma. Noncalcified 0.6 by 0.5 cm right middle lobe nodule on image 116/6 stable 02/22/2015 and highly likely to be benign and incidental based on this long-term stability. 4 mm right lower lobe nodule anteriorly on image 143/6 likewise  stable from 2016 and considered benign. Bilateral airway thickening noted. 3 mm peribronchovascular nodule in the right lower lobe on image 127/6. Densely calcified granuloma in the left upper lobe on image 45/6. Other calcified granulomas are present in the left lung. Musculoskeletal: Unremarkable CT ABDOMEN PELVIS FINDINGS Hepatobiliary: No findings of metastatic disease to the liver. The gallbladder unremarkable. No significant biliary dilatation. Pancreas: Unremarkable Spleen: Unremarkable Adrenals/Urinary Tract: Unremarkable Stomach/Bowel: The gastric mass is surprisingly inconspicuous on CT, but may be present posteriorly along the gastric wall on image 83/2. This area is difficult to compare on the prior exam because motion artifact caused blurring of this portion of the stomach previously. Mild prominence of stool in the proximal half of the colon but not the distal half. 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 Aortoiliac atherosclerotic vascular disease. Occluded right common iliac artery with patent femoral-femoral bypass and resulting contrast opacification in the external and internal iliac arteries on the right. There are several clustered right gastric lymph nodes, the largest is 7 mm in short axis on image 68/2. These were blurred on the prior exam due to motion artifact. No overtly pathologic regional adenopathy is identified. Reproductive: Unremarkable Other: No supplemental non-categorized findings. Musculoskeletal: Disc bulges potentially causing central narrowing of the thecal sac at L3-4 and especially L4-5. IMPRESSION: 1. The gastric mass is somewhat inconspicuous on CT, but a potential 1.2 cm thick mucosal masses seen along the posterior gastric border on image 83/2. There several small adjacent right gastric lymph nodes measuring up to 0.7 cm in short axis, but no overtly pathologic adenopathy and no findings of hepatic or peritoneal metastatic disease at this time.  2. Calcified granulomas in both lungs along with several noncalcified nodules stable from August 2016 and likely benign given this long-term stability. 3. Other imaging findings of potential clinical significance: Aortic Atherosclerosis (ICD10-I70.0) and Emphysema (ICD10-J43.9). Airway thickening is present,  suggesting bronchitis or reactive airways disease. 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 Disc bulges at L3-4 and L4-5. Electronically Signed   By: Van Clines M.D.   On: 06/22/2017 11:11   Ir US Guide Vasc Access Right  Result Date: 07/05/2017 CLINICAL DATA:  GASTRIC CANCER, ACCESS FOR CHEMOTHERAPY EXAM: RIGHT INTERNAL JUGULAR SINGLE LUMEN POWER PORT CATHETER INSERTION Date:  12/27/201812/27/2018 1:09 pm Radiologist:  M. Daryll Brod, MD Guidance:  Ultrasound and fluoroscopic MEDICATIONS: Ancef 2 g; The antibiotic was administered within an appropriate time interval prior to skin puncture. ANESTHESIA/SEDATION: Versed 1.5 mg IV; Fentanyl 75 mcg IV; Moderate Sedation Time:  30 minutes The patient was continuously monitored during the procedure by the interventional radiology nurse under my direct supervision. FLUOROSCOPY TIME:  36 seconds (1 mGy) COMPLICATIONS: None immediate. CONTRAST:  None. PROCEDURE: Informed consent was obtained from the patient following explanation of the procedure, risks, benefits and alternatives. The patient understands, agrees and consents for the procedure. All questions were addressed. A time out was performed. Maximal barrier sterile technique utilized including caps, mask, sterile gowns, sterile gloves, large sterile drape, hand hygiene, and 2% chlorhexidine scrub. Under sterile conditions and local anesthesia, right internal jugular micropuncture venous access was performed. Access was performed with ultrasound. Images were obtained for documentation. A guide wire was inserted followed by a  transitional dilator. This allowed insertion of a guide wire and catheter into the IVC. Measurements were obtained from the SVC / RA junction back to the right IJ venotomy site. In the right infraclavicular chest, a subcutaneous pocket was created over the second anterior rib. This was Caldwell under sterile conditions and local anesthesia. 1% lidocaine with epinephrine was utilized for this. A 2.5 cm incision was made in the skin. Blunt dissection was performed to create a subcutaneous pocket over the right pectoralis major muscle. The pocket was flushed with saline vigorously. There was adequate hemostasis. The port catheter was assembled and checked for leakage. The port catheter was secured in the pocket with two retention sutures. The tubing was tunneled subcutaneously to the right venotomy site and inserted into the SVC/RA junction through a valved peel-away sheath. Position was confirmed with fluoroscopy. Images were obtained for documentation. The patient tolerated the procedure well. No immediate complications. Incisions were closed in a two layer fashion with 4 - 0 Vicryl suture. Dermabond was applied to the skin. The port catheter was accessed, blood was aspirated followed by saline and heparin flushes. Needle was removed. A dry sterile dressing was applied. IMPRESSION: Ultrasound and fluoroscopically guided right internal jugular single lumen power port catheter insertion. Tip in the SVC/RA junction. Catheter ready for use. Electronically Signed   By: Jerilynn Mages.  Shick M.D.   On: 07/05/2017 13:14   Ir Fluoro Guide Port Insertion Right  Result Date: 07/05/2017 CLINICAL DATA:  GASTRIC CANCER, ACCESS FOR CHEMOTHERAPY EXAM: RIGHT INTERNAL JUGULAR SINGLE LUMEN POWER PORT CATHETER INSERTION Date:  12/27/201812/27/2018 1:09 pm Radiologist:  M. Daryll Brod, MD Guidance:  Ultrasound and fluoroscopic MEDICATIONS: Ancef 2 g; The antibiotic was administered within an appropriate time interval prior to skin puncture.  ANESTHESIA/SEDATION: Versed 1.5 mg IV; Fentanyl 75 mcg IV; Moderate Sedation Time:  30 minutes The patient was continuously monitored during the procedure by the interventional radiology nurse under my direct supervision. FLUOROSCOPY TIME:  36 seconds (1 mGy) COMPLICATIONS: None immediate. CONTRAST:  None. PROCEDURE: Informed consent was obtained from the patient following explanation of the procedure, risks,  benefits and alternatives. The patient understands, agrees and consents for the procedure. All questions were addressed. A time out was performed. Maximal barrier sterile technique utilized including caps, mask, sterile gowns, sterile gloves, large sterile drape, hand hygiene, and 2% chlorhexidine scrub. Under sterile conditions and local anesthesia, right internal jugular micropuncture venous access was performed. Access was performed with ultrasound. Images were obtained for documentation. A guide wire was inserted followed by a transitional dilator. This allowed insertion of a guide wire and catheter into the IVC. Measurements were obtained from the SVC / RA junction back to the right IJ venotomy site. In the right infraclavicular chest, a subcutaneous pocket was created over the second anterior rib. This was Caldwell under sterile conditions and local anesthesia. 1% lidocaine with epinephrine was utilized for this. A 2.5 cm incision was made in the skin. Blunt dissection was performed to create a subcutaneous pocket over the right pectoralis major muscle. The pocket was flushed with saline vigorously. There was adequate hemostasis. The port catheter was assembled and checked for leakage. The port catheter was secured in the pocket with two retention sutures. The tubing was tunneled subcutaneously to the right venotomy site and inserted into the SVC/RA junction through a valved peel-away sheath. Position was confirmed with fluoroscopy. Images were obtained for documentation. The patient tolerated the procedure  well. No immediate complications. Incisions were closed in a two layer fashion with 4 - 0 Vicryl suture. Dermabond was applied to the skin. The port catheter was accessed, blood was aspirated followed by saline and heparin flushes. Needle was removed. A dry sterile dressing was applied. IMPRESSION: Ultrasound and fluoroscopically guided right internal jugular single lumen power port catheter insertion. Tip in the SVC/RA junction. Catheter ready for use. Electronically Signed   By: Jerilynn Mages.  Shick M.D.   On: 07/05/2017 13:14    CT CAP W Contrast 06/22/17  IMPRESSION: 1. The gastric mass is somewhat inconspicuous on CT, but a potential 1.2 cm thick mucosal masses seen along the posterior gastric border on image 83/2. There several small adjacent right gastric lymph nodes measuring up to 0.7 cm in short axis, but no overtly pathologic adenopathy and no findings of hepatic or peritoneal metastatic disease at this time. 2. Calcified granulomas in both lungs along with several noncalcified nodules stable from August 2016 and likely benign given this long-term stability. 3. Other imaging findings of potential clinical significance: Aortic Atherosclerosis (ICD10-I70.0) and Emphysema (ICD10-J43.9). Airway thickening is present, suggesting bronchitis or reactive airways disease. 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 Disc bulges at L3-4 and L4-5.   CT CAP 04/02/17 IMPRESSION: 1. Aortic atherosclerosis with fusiform ectasia of the infrarenal abdominal aorta which measures up to 2.6 x 2.3 cm, and contains a large burden of mural thrombus. 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 2. Today's study was not a CTA examination, which limits assessment of vascular abnormalities. Additionally, secondary to extensive patient respiratory motion,  accurate assessment of small vessels is limited. With these limitations in mind, there is potential stenosis of both the origin of the celiac axis and the origin of the inferior mesenteric artery. Given the patient's history of intermittent abdominal pain, further evaluation with CTA of the abdomen and pelvis should be considered if clinically appropriate, and if the patient can be counseled to adequately hold his breath during the examination to allow for optimal  image quality. 3. No other acute findings are noted in the chest, abdomen or pelvis to account for the patient's symptoms. 4. Diffuse bronchial wall thickening with moderate centrilobular and paraseptal emphysema; imaging findings suggestive of underlying COPD. 5. Multiple small pulmonary nodules measuring up to 6 mm, stable compared to prior examinations dating back take 02/22/2015, considered benign. No future follow-up needed. This recommendation follows the consensus statement: Guidelines for Management of Incidental Pulmonary Nodules Detected on CT Images: From the Fleischner Society 2017; Radiology 2017; 284:228-243. 6. Additional incidental findings, as above. Aortic Atherosclerosis (ICD10-I70.0) and Emphysema (ICD10-J43.9).   ASSESSMENT & PLAN:  Bobby Caldwell is a 62 y.o. [place] male with a history of heavy smoking and COPD, peripheral vascular disease, presents with fatigue, anorexia, early satiety and weight loss   1. Gastric Adenocarcinoma, uT2N0M0, stage I -I reviewed his EGD findings and biopsy pathology results that show gastric adenocarcinoma. He had CT chest, abdomen and pelvis with contrast for his peripheral vascular disease on 04/02/2017, which shows no sign of metastasis at that time.  -I will repeat staging scan to rule out new distant metastasis since last scan.  -His EUS showed a T2N0 lesion in distal gastric body -Due to his intensive peripheral vascular disease and recent bypass surgery, poor nutrition  status, Dr. [name] not feel he is a great surgical candidate at this point, she recommended him to improve his nutrition status and to consider neoadjuvant chemotherapy first.  Giving the T2 lesion, neoadjuvant chemotherapy is reasonable and I agree with Dr. [name]. -The period chemo FLOT4 (5-fu, oxaliplatin, and docetaxel) has showed superior outcome for gastric cancer. However, due to his poor nutrition status, comorbidities, and poor performance status, he may not be able to tolerate such intensive chemotherapy.  I will consider FOLFOX as the first cycle of neoadjuvant chemo, his nutrition status improved and he tolerates well, will consider change to FLOT4 -His cardiac workup with Dr. Ellyn Hack has been completed.  He has low possibility of cardiac ischemia and he is cleared to proceed with chemotherapy -He had port placed yesterday, he has some erythema from placement and some pain after procedure. He had taken tylenol but was not enough. I prescribed him tramadol for the pain. Will monitor area.   -We reviewed his CT CAP from 06/22/17 showed no other metastatic disease.  -He will proceed with FOLFOX with leucovorin every 2 weeks for at last 6 cycles starting 07/07/17. Will start with dose reduction for cycle 1 due to his limited performance status and nutrition status. If tolerable will increase to full dose cycle 2. I discussed the side effects with patient in great detail. he agrees to proceed. -The goal of therapy is curative -He moderately tolerated first cycle FOLFOX with nausea and vomiting. I reviewed his antiemetics regimen. He is to take Zofran or campazine 30 minutes before each meal and once between each meal as needed. He is fine to stop if he no longer experienced nausea. I strongly encouraged him to take supplemental food. I prescribed Mirtazapine to help his appetite.   -Will give IV Fluids today  -I will give him enough time to recover, next treatment on 1/14 -CBC reviewed and WNL.    -f/u in 2 weeks   2. Anorexia, nausea and weight loss  -He has loss 14 pounds over past 6 months  -I previously set up nutrition consult -I called in compazine for his nausea  -I will discuss J-tube placement with Dr. [name]   -He will see dietician  on 12/17.  -He currently takes ensure boosts daily.  -He has been gaining weight recently. I encouraged him to continue with supplement to gain more weight.  -I reviewed his antiemetic regimen, and encouraged him to take medication 30 minutes before each meal as needed.  -I prescribed mirtazapine today to help with his appetite.   3. COPD, Smoking Cessation  -He reduced from 50 cigars a day to 10 cigars a day. He is actively trying to quit.  -I discussed his chest pain is due to his COPD and smoking.   4. Financial support -I will refer him to our social worker for help with financial help, he is no longer able to work and would like apply for disability.   -His wife has not returned to work to help him at home. I recommend she apply for FMLA at her place of work.  -He will need assistance with transportation.  -I previously set up SW consultation   5. Personality change, ? Depression and or anxiety -Patient's daughter called me after his visit, she is concerned about his personality change lately, he has been agitated, gets upset quickly, and not sleeping well.  Daughter is concerned about depression and anxiety.  I think this is possible related to steroids, and or his side effect from chemo.  I plan to start him on mirtazapine today. -I encouraged daughter to watch him at home, and call us if she has concerns. -Patient denied depression, he met our social worker today, and he declined transportation assistance.  PLAN:  -Prescribed Mirtazapine today  -IV Fluids today  - Lab, flush, f/u and chemo FOLFOX on 1/14 and 1/28     No orders of the defined types were placed in this encounter.   All questions were answered. The patient  knows to call the clinic with any problems, questions or concerns. I spent 20 minutes counseling the patient face to face. The total time spent in the appointment was 30 minutes and more than 50% was on counseling.     [name], MD 07/11/2017   This document serves as a record of services personally performed by [name], MD. It was created on her behalf by Joslyn Devon, a trained medical scribe. The creation of this record is based on the scribe's personal observations and the provider's statements to them.    I have reviewed the above documentation for accuracy and completeness, and I agree with the above.

## 2017-07-11 NOTE — Telephone Encounter (Signed)
Received call from daughter this am stating that pt had already left to come for visit.  She states that pt is angry & has mood swings & he drove himself today & doesn't tell anyone where he is going.  She would like to talk with Dr Burr Medico for suggestions.  Message given to Dr Burr Medico.

## 2017-07-11 NOTE — Progress Notes (Signed)
Received referral from [place] to inquire about grant for patient.   Advised her I could schedule appointment to meet with him at his next visit and to have him bring proof of household income. Appointment scheduled for 07/24/16@9 :30am with me. Vernie Shanks took my card to give to patient.

## 2017-07-11 NOTE — Telephone Encounter (Signed)
Gave patient avs report and appointments for January. Next appointment for lab/fu/tx scheduled 1/15 - capped 1/14. Patient aware.

## 2017-07-12 ENCOUNTER — Telehealth: Payer: Self-pay | Admitting: *Deleted

## 2017-07-12 NOTE — Telephone Encounter (Signed)
Received call from Earl Gala, RN @ [facility] wanting to confirm that pt is seeing Dr. Burr Medico, and receiving chemo treatments.  Called Melanie back, and left message on voice mail requesting a call back to collaborative nurse. Melanie's   Phone      [phone]  Ext  [phone].

## 2017-07-17 ENCOUNTER — Telehealth: Payer: Self-pay | Admitting: *Deleted

## 2017-07-17 NOTE — Telephone Encounter (Signed)
Pt seen in lobby after he filled out walk-in form. He stated on walk-in form that his 'meds' were too strong.  Spoke with pt and he showed me the medication he feels is too strong. It is the Tramadol 50 mg. He states it makes him dizzy and a little unsteady on his feet. Advised him that he could try half and tablet-they can be broken in half.  If he felt ok on half a tablet-he could do that. If the half tablet was too strong, then he could stop it altogether.  He voiced understanding and agreed to try half.  He has not started the remeron yet.  He has had this prescription since 07/11/17. Reminded him that this was to help stimulate his appetite and to take @ bedtime.  He stated he would start tonight. Encouraged him to continue to increase fluids as tolerated-water, juices etc not just soda. He states he drinks cola drinks. Advised him that drinks with caffeine aren't as helpful because of the caffiene acting like a diuretic. Pt voiced understanding  Advised pt that if he had any further problems with meds to call or come by the cancer center. He voiced understanding. He is aware of future appts with Dr. Burr Medico.

## 2017-07-20 ENCOUNTER — Ambulatory Visit (INDEPENDENT_AMBULATORY_CARE_PROVIDER_SITE_OTHER): Payer: BLUE CROSS/BLUE SHIELD | Admitting: Cardiology

## 2017-07-20 ENCOUNTER — Encounter: Payer: Self-pay | Admitting: Cardiology

## 2017-07-20 VITALS — BP 126/82 | HR 76 | Ht 66.0 in | Wt 118.0 lb

## 2017-07-20 DIAGNOSIS — Z01810 Encounter for preprocedural cardiovascular examination: Secondary | ICD-10-CM | POA: Diagnosis not present

## 2017-07-20 DIAGNOSIS — R0609 Other forms of dyspnea: Secondary | ICD-10-CM | POA: Diagnosis not present

## 2017-07-20 DIAGNOSIS — I10 Essential (primary) hypertension: Secondary | ICD-10-CM

## 2017-07-20 DIAGNOSIS — F17210 Nicotine dependence, cigarettes, uncomplicated: Secondary | ICD-10-CM

## 2017-07-20 DIAGNOSIS — R0789 Other chest pain: Secondary | ICD-10-CM | POA: Diagnosis not present

## 2017-07-20 NOTE — Progress Notes (Signed)
PCP: Dr. Glendale Chard & Minette Brine NP [facility] Dr. Lenna Gilford - Pulmonary Medicine Vascular Surgeon: Dr. Glennon Hamilton  Clinic Note: Chief Complaint  Patient presents with  . Follow-up    discuss testing   . Pre-op Exam    HPI: Bobby Caldwell is a 62 y.o. 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 Bobby Caldwell is a 62 y.o. male who was initially seen the hospital 05/04/2017 for the preoperative evaluation prior to femorofemoral bypass the request of Dr Adele Barthel. He originally from [place], & is a life long smoker & has paid a price for this behavior w/ severe emphysema & severe occlusive PAD.    Shafiq Larch was initially seen in consultation while inpatient following his lower extremity angiogram and left internal iliac angioplasty/stenting.  At that time he was noticing significant exertional dyspnea but no more than his usual.  Based on the significant nature of his lower extremity disease, recommendation was to proceed with his the peripheral vascular surgery without additional cardiac evaluation and then cardiac evaluation following. - I then saw him back in December for preoperative evaluation for initiating chemotherapy and possible surgery for what appears to be metastatic gastric adenocarcinoma.  Recent Hospitalizations:   None since last visit  Studies Personally Reviewed - (if available, images/films reviewed: From Epic Chart or Care Everywhere)   2 D Echo 06/26/2017: Normal LV size and function. EF 60-65%. No wall motion abnormalities. GR 1 DD. Mild LA dilation  MYOVIEW  06/2017  Nuclear stress EF: 50%. There is septal wall akinesis  Defect 1: There is a small defect of moderate severity present in the basal inferoseptal location.  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  This is a low  risk study. Basal septal perfusion defect noted. However, echocardiogram shows normal ejection fraction with no wall motion abnormalities. Question if septal changes are artifactual.    Interval History: Bobby Caldwell returns now for [place] - after Echo & Myoview.   He still has pretty significant exertional dyspnea, but denies any chest pain or real claudication symptoms. The claudication is notably improved. He feels and seems a little bit stronger than when I last saw him. His weight is finally stabilized, and he seemed to be eating a little bit better. Exline besides his baseline exertional dyspnea, he is not having any other real active cardiac symptoms. Occasional positional dizziness, no tachycardia rapid heartbeats. Chest pain symptom that he has all seems to be related to coughing and not necessarily with exertion. Remainder of cardiac review of symptoms: No PND, orthopnea or edema.  No palpitations, lightheadedness, dizziness, weakness or syncope/near syncope. No TIA/amaurosis fugax symptoms. Overall improved claudication.  He is doing the best he can but is yet to fully complete smoking cessation.  ROS: A comprehensive was performed. Review of Systems  Constitutional: Positive for malaise/fatigue. Negative for weight loss.       Not really fevers or chills, but he has felt hot and flushed  HENT: Negative for congestion.   Eyes: Positive for blurred vision.  Respiratory: Positive for cough (Worsening cough), sputum production, shortness of breath and wheezing.   Cardiovascular: Negative for chest pain.       Per HPI  Gastrointestinal: Positive for abdominal pain, nausea and vomiting. Negative for blood in stool, diarrhea and melena.  Genitourinary: Negative for  hematuria.  Neurological: Positive for dizziness, weakness and headaches.  Psychiatric/Behavioral: Negative for depression and memory loss. The patient is not nervous/anxious.   All other  systems reviewed and are negative.   I have reviewed and (if needed) personally updated the patient's problem list, medications, allergies, past medical and surgical history, social and family history.   Past Medical History:  Diagnosis Date  . COPD (chronic obstructive pulmonary disease) ([place])   . Dyspnea   . PAD (peripheral artery disease) ([place])   . Stomach cancer ([place])   . Tobacco abuse   . Weight loss     Past Surgical History:  Procedure Laterality Date  . ABDOMINAL AORTOGRAM W/LOWER EXTREMITY N/A 05/03/2017   Procedure: ABDOMINAL AORTOGRAM W/LOWER EXTREMITY;  Surgeon: Conrad Montezuma, MD;  Location: [facility];  Service: Cardiovascular;  Laterality: N/A;  . ESOPHAGOGASTRODUODENOSCOPY     05/24/17  . ESOPHAGOGASTRODUODENOSCOPY (EGD) WITH PROPOFOL N/A 05/24/2017   Procedure: ESOPHAGOGASTRODUODENOSCOPY (EGD) WITH PROPOFOL;  Surgeon: [name], MD;  Location: [facility];  Service: Endoscopy;  Laterality: N/A;  . EUS N/A 06/07/2017   Procedure: UPPER ENDOSCOPIC ULTRASOUND (EUS) RADIAL;  Surgeon: [name], MD;  Location: [facility];  Service: Endoscopy;  Laterality: N/A;  . FEMORAL-FEMORAL BYPASS GRAFT Bilateral 05/08/2017   Procedure: LEFT TO RIGHT BYPASS GRAFT FEMORAL-FEMORAL ARTERY;  Surgeon: Conrad Kilkenny, MD;  Location: [facility];  Service: Vascular;  Laterality: Bilateral;  . IR FLUORO GUIDE PORT INSERTION RIGHT  07/05/2017  . IR US GUIDE VASC ACCESS RIGHT  07/05/2017  . NM MYOVIEW LTD  06/2017   LOW RISK. Small basal inferoseptal defect thought to be related to artifact (although cannot exclude prior infarct). Normal wall motion i in this area would suggest artifact not infarct. Otherwise no ischemia or infarction.  Marland Kitchen PERIPHERAL VASCULAR INTERVENTION Left 05/03/2017   Procedure: PERIPHERAL VASCULAR INTERVENTION;  Surgeon: Conrad Springs, MD;  Location: [facility];  Service: Cardiovascular;  Laterality: Left;  common iliac  . TRANSTHORACIC  ECHOCARDIOGRAM  06/26/2017   Normal LV size and function. EF 60-65%. No wall motion abnormalities. GR 1 DD. Mild LA dilation    Current Meds  Medication Sig  . Albuterol Sulfate (PROAIR RESPICLICK) 253 (90 Base) MCG/ACT AEPB Inhale 2 puffs into the lungs every 6 (six) hours as needed. (Patient taking differently: Inhale 2 puffs into the lungs every 6 (six) hours as needed (shortness of breath). )  . atorvastatin (LIPITOR) 10 MG tablet Take 1 tablet (10 mg total) by mouth daily.  . budesonide-formoterol (SYMBICORT) 160-4.5 MCG/ACT inhaler Inhale 2 puffs into the lungs 2 (two) times daily.  . clopidogrel (PLAVIX) 75 MG tablet Take 1 tablet (75 mg total) by mouth daily.  Marland Kitchen dexamethasone (DECADRON) 4 MG tablet Take 2 tablets (8 mg total) by mouth daily. Start the day after chemotherapy for 2 days. Take with food.  . lidocaine-prilocaine (EMLA) cream Apply to affected area once  . loperamide (IMODIUM A-D) 2 MG tablet Take 2 mg by mouth 4 (four) times daily as needed for diarrhea or loose stools.  Marland Kitchen LORazepam (ATIVAN) 0.5 MG tablet Take 1 tablet (0.5 mg total) by mouth every 6 (six) hours as needed (Nausea or vomiting).  . mirtazapine (REMERON) 7.5 MG tablet Take 1 tablet (7.5 mg total) by mouth at bedtime.  . ondansetron (ZOFRAN) 8 MG tablet Take 1 tablet (8 mg total) by mouth 2 (two) times daily as needed for refractory nausea / vomiting. Start on day 3  after chemotherapy.  Marland Kitchen oxyCODONE-acetaminophen (ROXICET) 5-325 MG tablet Take 1 tablet by mouth every 6 (six) hours as needed for severe pain.  Marland Kitchen prochlorperazine (COMPAZINE) 10 MG tablet Take 1 tablet (10 mg total) by mouth every 6 (six) hours as needed for nausea or vomiting.  . prochlorperazine (COMPAZINE) 10 MG tablet Take 1 tablet (10 mg total) by mouth every 6 (six) hours as needed (Nausea or vomiting).  . Tiotropium Bromide Monohydrate (SPIRIVA RESPIMAT) 2.5 MCG/ACT AERS Inhale 2 puffs into the lungs daily. (Patient taking differently: Inhale 1  puff into the lungs daily. )  . traMADol (ULTRAM) 50 MG tablet Take 1 tablet (50 mg total) by mouth every 6 (six) hours as needed.    No Known Allergies  Social History   Socioeconomic History  . Marital status: Married    Spouse name: None  . Number of children: 4  . Years of education: None  . Highest education level: None  Social Needs  . Financial resource strain: None  . Food insecurity - worry: None  . Food insecurity - inability: None  . Transportation needs - medical: None  . Transportation needs - non-medical: None  Occupational History  . None  Tobacco Use  . Smoking status: Current Every Day Smoker    Packs/day: 1.00    Years: 50.00    Pack years: 50.00    Types: Cigarettes  . Smokeless tobacco: Never Used  . Tobacco comment: 10 cigarettes per day  Substance and Sexual Activity  . Alcohol use: No    Alcohol/week: 0.0 oz  . Drug use: No  . Sexual activity: Yes  Other Topics Concern  . None  Social History Narrative  . None    Family history is unknown by patient.  Wt Readings from Last 3 Encounters:  07/20/17 118 lb (53.5 kg)  07/11/17 115 lb 4.8 oz (52.3 kg)  07/06/17 115 lb 3.2 oz (52.3 kg)    PHYSICAL EXAM BP 126/82   Pulse 76   Ht 5\' 6"  (1.676 m)   Wt 118 lb (53.5 kg)   BMI 19.05 kg/m  Physical Exam  Constitutional: He is oriented to person, place, and time. No distress.  10/frail gaunt-appearing gentleman. Temporal wasting noted. Chronically ill appearing, but is well-groomed.  HENT:  Head: Normocephalic and atraumatic.  Temporal wasting  Eyes: EOM are normal. No scleral icterus.  Neck: Normal range of motion. Neck supple. No hepatojugular reflux and no JVD present. Carotid bruit is present (Soft bilateral carotid bruits).  Cardiovascular: Normal rate, regular rhythm and intact distal pulses.  Occasional extrasystoles are present. PMI is not displaced (Hyperdynamic precordium). Exam reveals distant heart sounds (Very hard to hear because  of coarse breath sounds) and decreased pulses (But palpable). Exam reveals no gallop and no friction rub.  No murmur heard. Feet are warm  Pulmonary/Chest: No respiratory distress (Simply baseline increased work of breathing). He has wheezes (Diffuse expiratory). He has no rales.  Baseline increased work of breath.  Coarse diffuse rhonchorous breath sounds with expiratory wheezing.  Difficult to tell it does not sound like wet rales.  More like crackles and rhonchi.  Abdominal: He exhibits no distension. There is tenderness (Epigastric and right upper quadrant).  Scaphoid abdomen with diffuse lower quadrant tenderness to palpation and as well as epigastric pain.  Musculoskeletal: Normal range of motion. He exhibits no edema.  Neurological: He is alert and oriented to person, place, and time. No cranial nerve deficit.  Skin: Skin is warm and  dry.  Warm but dry scaly skin.  No obvious rashes or lesions  Psychiatric: He has a normal mood and affect. His behavior is normal. Thought content normal.  He seems to be somewhat somber, subdued.  Almost let down.  He has his questions appropriately and is awake and alert.  Occasionally provides animated answers to the interpreters questions.  Nursing note and vitals reviewed.    Adult ECG Report Not checked   Other studies Reviewed: Additional studies/ records that were reviewed today include:  Recent Labs:   Lab Results  Component Value Date   CREATININE 0.7 07/11/2017   BUN 9.8 07/11/2017   NA 139 07/11/2017   K 3.5 07/11/2017   CL 98 (L) 07/05/2017   CO2 32 (H) 07/11/2017   No results found for: HGBA1C CBC Latest Ref Rng & Units 07/11/2017 07/06/2017 07/05/2017  WBC 4.0 - 10.3 10e3/uL 7.5 7.8 9.0  Hemoglobin 13.0 - 17.1 g/dL 15.0 14.9 16.3  Hematocrit 38.4 - 49.9 % 43.7 44.5 47.9  Platelets 140 - 400 10e3/uL 165 166 196    No results found for: CHOL, HDL, LDLCALC, LDLDIRECT, TRIG, CHOLHDL   ASSESSMENT / PLAN: Problem List Items  Addressed This Visit    Atypical chest pain (Chronic)    Multiple cardiac risk factors, but negative Myoview. This would argue against CAD, however cannot exclude left main disease, however less likely.      Cigarette smoker (Chronic)    Not ready to quit      DOE (dyspnea on exertion)    With essentially normal Myoview and echocardiogram, I would suspect this is probably related to COPD and not cardiac.      Essential hypertension (Chronic)    Controlled on no medications at this time. If he were to become hypertensive, would potentially consider bisoprolol for perioperative risk reduction      Preop cardiovascular exam    He has not had any true anginal symptoms - with negative Myoview He doesn't necessarily have any heart failure symptoms either. He is nondiabetic, and has not had a stroke.  Intra-abdominal surgery would be high risk.  PREOPERATIVE CARDIAC RISK ASSESSMENT   Revised Cardiac Risk Index:  High Risk Surgery: yes; Intraperitoneal  Defined as Intraperitoneal, intrathoracic or suprainguinal vascular  Active CAD: no; Negative Myoview & no active Angina  CHF: no; Normal EF on Echo with only Gr 1 DD.  Cerebrovascular Disease: no; not diagnosed  Diabetes: no; On Insulin: no  CKD (Cr >~ 2): no;   Total: 1 Estimated Risk of Adverse Outcome: LOW RISK FROM CV STANDPOINT - BUT WITH COPD - LIKELY AT LEAST MODERATE RISK  Estimated Risk of MI, PE, VF/VT (Cardiac Arrest), Complete Heart Block: 1-3 %  Quite fortunately, despite having significant lower extremity PAD, he had a negative Myoview on the stress test and relatively normal echocardiogram. Certainly cannot exclude left main disease, but he has not had any true anginal symptoms.  No further cardiac testing required.         Current medicines are reviewed at length with the patient today. (+/- concerns) n/s The following changes have been made: n/s  Patient Instructions  No changes with medication  or treatment    Your physician wants you to follow-up on an as needed basis.     Studies Ordered:   No orders of the defined types were placed in this encounter.     Glenetta Hew, M.D., M.S. Interventional Cardiologist   Pager # [phone] Phone # [phone]  [address] [place], [place] [address]

## 2017-07-20 NOTE — Patient Instructions (Signed)
No changes with medication or treatment    Your physician wants you to follow-up on an as needed basis.

## 2017-07-23 ENCOUNTER — Encounter: Payer: Self-pay | Admitting: Cardiology

## 2017-07-23 NOTE — Assessment & Plan Note (Signed)
Multiple cardiac risk factors, but negative Myoview. This would argue against CAD, however cannot exclude left main disease, however less likely.

## 2017-07-23 NOTE — Progress Notes (Signed)
[place]  [phone] [phone]  Clinic Follow Up Note   Patient Care Team: Minette Brine as PCP - General (General Practice) [name], MD as Consulting Physician (Pulmonary Disease) [name], MD as Consulting Physician (Hematology) [name], MD as Attending Physician (Gastroenterology) [name], MD as Consulting Physician (General Surgery)   Date of Service: 07/24/2017   CHIEF COMPLAINTS:  Follow up gastric Adenocarcinoma   Oncology History   Cancer Staging Gastric cancer [facility]) Staging form: Stomach, AJCC 8th Edition - Clinical stage from 05/24/2017: Stage I (cT2, cN0, cM0) - Signed by [name], MD on 06/19/2017       Gastric cancer ([place])   05/24/2017 Procedure    EGD by Dr. [name] 05/24/17 IMPRESSION Ulcerated mass like region in the distal stomach (4cm across), extensively biopsied. This appears neoplastic. - Abnormal mucosa throughout stomach otherwise (mild-moderate gastritis), also extensively biopsied.      05/24/2017 Initial Biopsy    Diagnosis 05/24/17  1. Stomach, biopsy, mass distal - ADENOCARCINOMA, SEE COMMENT. - CHRONIC ACTIVE GASTRITIS WITH INTESTINAL METAPLASIA. 2. 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 - NO DYSPLASIA OR MALIGNANCY      06/04/2017 Initial Diagnosis    Gastric cancer ([place])      06/07/2017 Procedure    EUS by Dr. [name] on 06/07/17  IMPRESSION:  5cm across, partially circumferential uT2N0 gastric adenocarcinoma laying in the distal stomach 3-4cm proximal to the pylorus.      06/22/2017 Imaging    CT CAP W Contrast 06/22/17  IMPRESSION: 1. The gastric mass is somewhat inconspicuous on CT, but a potential 1.2 cm thick mucosal masses seen along the posterior gastric border on image 83/2. There several small adjacent right gastric lymph nodes measuring up to 0.7 cm in short axis, but  no overtly pathologic adenopathy and no findings of hepatic or peritoneal metastatic disease at this time. 2. Calcified granulomas in both lungs along with several noncalcified nodules stable from August 2016 and likely benign given this long-term stability. 3. Other imaging findings of potential clinical significance: Aortic Atherosclerosis (ICD10-I70.0) and Emphysema (ICD10-J43.9). Airway thickening is present, suggesting bronchitis or reactive airways disease. 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 Disc bulges at L3-4 and L4-5.      07/05/2017 Surgery    PAC placement       07/07/2017 -  Chemotherapy    Neoadjuvant FOLFOX with with leucovorin every 2 weeks for 6 cycles starting 07/07/17. Will start with reduced dose for cycle 1.         HISTORY OF PRESENTING ILLNESS: 06/05/17  Bobby Caldwell 62 y.o. male is here because of newly diagnosed Gastric Cancer. He was referred by Dr. [name]. He presents to the clinic today accompanied by an albanian interpretor and then helped by video interpretor [phone] to help translate his visit.   In the past he was diagnosed with COPD controlled with inhalers. He has smoked for several years and is down to smoking 10 cigars a day. He is actively trying to quit.   Today he notes he is upset about his diagnosis and he recently had bypass surgery on his bilateral legs in 04/2017 and feels fatigued from this. He met with Dr. [name] today and he was told he will need chemo and surgery. He is willing to go with whatever is recommended.  He notes this started with issues with  both legs feeling weak and shaking. This occurred for 2-3 months. He had bypass surgery with Dr. Bridgett Larsson. He does not walk much so he has not tested how well he can walk much. The 03/2017 CT scan before his surgery showed thickening in his stomach which is why he was referred to Dr. [name]. Patient notes his stomach  issues has been ongoing, but recently has burning sensation below umbilical area since endoscopy. This was only mildly presents prior to procedure. His appetite has not changed much. He doesn't dare to eat much as he is afraid to vomit. He has felt this way for th past 6 months. He currently loss 14 pounds in the past 6 months. He does not go out much anymore, he mostly stays in his house. He is able to do ALDs himself, his wife helps out around the house and cares for him. He has 4 children, 37-21yo. His wife does not work. He does not find himself feeling up to working anymore, he has no energy to do anything.  He notes pain in his chest from his COPD. He uses inhalers 3 times a day. He would like to do the surgery alone but if chemo is needed he is fine with proceeding with treatment. He reports his body shakes when he gets blood drawn. He is willing to try oral iron in the meantime. He would like to be seen by social worker to help him our with finances as he is not able to work. He feels he often has to break due to sever fatigue and weakness.     CURRENT THERAPY: neoadjuvant FOLFOX with leucovorin every 2 weeks for 6 cycles starting 07/07/17    INTERVAL HISTORY:  Bobby Caldwell is here for a follow up and cycle 3 of FOLFOX. He has not been tolerating that well. He has nausea after treatment. He notes that his port site bothers him when he starts coughing. He endorses increased dyspnea on exertion and fatigue that are both contributing to his decreased activity level. This is chronic but he feels that his symptoms have become slightly worse since starting chemo.   He has complaints of taking too many medications and he is confused by what he needs to take every day and what he should take as needed. He has had increased drowsiness lately and thinks it is due to medication. He takes Remeron in the morning and notes that it does help his appetite. He has constipation but he has been taking imodium. He  reports intermittent numbness in his feet and hands. He notes that his balance is a little off as well. He drinks 30 - 50 oz of water per day.   On review of systems, pt reports no other complaints at this time. Pertinent positives are listed and detailed within the above HPI.   MEDICAL HISTORY:  Past Medical History:  Diagnosis Date  . COPD (chronic obstructive pulmonary disease) ([place])   . Dyspnea   . PAD (peripheral artery disease) ([place])   . Stomach cancer ([place])   . Tobacco abuse   . Weight loss     SURGICAL HISTORY: Past Surgical History:  Procedure Laterality Date  . ABDOMINAL AORTOGRAM W/LOWER EXTREMITY N/A 05/03/2017   Procedure: ABDOMINAL AORTOGRAM W/LOWER EXTREMITY;  Surgeon: Conrad Pinon, MD;  Location: [facility];  Service: Cardiovascular;  Laterality: N/A;  . ESOPHAGOGASTRODUODENOSCOPY     05/24/17  . ESOPHAGOGASTRODUODENOSCOPY (EGD) WITH PROPOFOL N/A 05/24/2017   Procedure: ESOPHAGOGASTRODUODENOSCOPY (EGD) WITH PROPOFOL;  Surgeon: [name], MD;  Location: [facility];  Service: Endoscopy;  Laterality: N/A;  . EUS N/A 06/07/2017   Procedure: UPPER ENDOSCOPIC ULTRASOUND (EUS) RADIAL;  Surgeon: [name], MD;  Location: [facility];  Service: Endoscopy;  Laterality: N/A;  . FEMORAL-FEMORAL BYPASS GRAFT Bilateral 05/08/2017   Procedure: LEFT TO RIGHT BYPASS GRAFT FEMORAL-FEMORAL ARTERY;  Surgeon: Conrad Lohman, MD;  Location: [place];  Service: Vascular;  Laterality: Bilateral;  . IR FLUORO GUIDE PORT INSERTION RIGHT  07/05/2017  . IR US GUIDE VASC ACCESS RIGHT  07/05/2017  . NM MYOVIEW LTD  06/2017   LOW RISK. Small basal inferoseptal defect thought to be related to artifact (although cannot exclude prior infarct). Normal wall motion i in this area would suggest artifact not infarct. Otherwise no ischemia or infarction.  Marland Kitchen PERIPHERAL VASCULAR INTERVENTION Left 05/03/2017   Procedure: PERIPHERAL VASCULAR INTERVENTION;  Surgeon: Conrad Wacousta, MD;   Location: [place] [facility];  Service: Cardiovascular;  Laterality: Left;  common iliac  . TRANSTHORACIC ECHOCARDIOGRAM  06/26/2017   Normal LV size and function. EF 60-65%. No wall motion abnormalities. GR 1 DD. Mild LA dilation    SOCIAL HISTORY: Social History   Socioeconomic History  . Marital status: Married    Spouse name: Not on file  . Number of children: 4  . Years of education: Not on file  . Highest education level: Not on file  Social Needs  . Financial resource strain: Not on file  . Food insecurity - worry: Not on file  . Food insecurity - inability: Not on file  . Transportation needs - medical: Not on file  . Transportation needs - non-medical: Not on file  Occupational History  . Not on file  Tobacco Use  . Smoking status: Current Every Day Smoker    Packs/day: 1.00    Years: 50.00    Pack years: 50.00    Types: Cigarettes  . Smokeless tobacco: Never Used  . Tobacco comment: 10 cigarettes per day  Substance and Sexual Activity  . Alcohol use: No    Alcohol/week: 0.0 oz  . Drug use: No  . Sexual activity: Yes  Other Topics Concern  . Not on file  Social History Narrative  . Not on file    FAMILY HISTORY: Family History  Family history unknown: Yes    ALLERGIES:  has No Known Allergies.  MEDICATIONS:  Current Outpatient Medications  Medication Sig Dispense Refill  . Albuterol Sulfate (PROAIR RESPICLICK) 301 (90 Base) MCG/ACT AEPB Inhale 2 puffs into the lungs every 6 (six) hours as needed. (Patient taking differently: Inhale 2 puffs into the lungs every 6 (six) hours as needed (shortness of breath). ) 1 each 6  . atorvastatin (LIPITOR) 10 MG tablet Take 1 tablet (10 mg total) by mouth daily. 30 tablet 2  . budesonide-formoterol (SYMBICORT) 160-4.5 MCG/ACT inhaler Inhale 2 puffs into the lungs 2 (two) times daily. 1 Inhaler 6  . clopidogrel (PLAVIX) 75 MG tablet Take 1 tablet (75 mg total) by mouth daily. 30 tablet 11  . dexamethasone (DECADRON) 4  MG tablet Take 2 tablets (8 mg total) by mouth daily. Start the day after chemotherapy for 2 days. Take with food. 30 tablet 1  . lidocaine-prilocaine (EMLA) cream Apply to affected area once 30 g 3  . loperamide (IMODIUM A-D) 2 MG tablet Take 2 mg by mouth 4 (four) times daily as needed for diarrhea or loose stools.    Marland Kitchen LORazepam (ATIVAN)  0.5 MG tablet Take 1 tablet (0.5 mg total) by mouth every 6 (six) hours as needed (Nausea or vomiting). 30 tablet 0  . mirtazapine (REMERON) 7.5 MG tablet Take 1 tablet (7.5 mg total) by mouth at bedtime. 30 tablet 1  . ondansetron (ZOFRAN) 8 MG tablet Take 1 tablet (8 mg total) by mouth 2 (two) times daily as needed for refractory nausea / vomiting. Start on day 3 after chemotherapy. 30 tablet 1  . oxyCODONE-acetaminophen (ROXICET) 5-325 MG tablet Take 1 tablet by mouth every 6 (six) hours as needed for severe pain. 20 tablet 0  . prochlorperazine (COMPAZINE) 10 MG tablet Take 1 tablet (10 mg total) by mouth every 6 (six) hours as needed (Nausea or vomiting). 30 tablet 1  . Tiotropium Bromide Monohydrate (SPIRIVA RESPIMAT) 2.5 MCG/ACT AERS Inhale 2 puffs into the lungs daily. (Patient taking differently: Inhale 1 puff into the lungs daily. ) 1 Inhaler 6  . traMADol (ULTRAM) 50 MG tablet Take 1 tablet (50 mg total) by mouth every 6 (six) hours as needed. 30 tablet 0   No current facility-administered medications for this visit.    Facility-Administered Medications Ordered in Other Visits  Medication Dose Route Frequency Provider Last Rate Last Dose  . fluorouracil (ADRUCIL) 3,400 mg in sodium chloride 0.9 % 82 mL chemo infusion  2,200 mg/m2 (Treatment Plan Recorded) Intravenous 1 day or 1 dose [name], MD   3,400 mg at 07/24/17 1634    REVIEW OF SYSTEMS:  Constitutional: Denies fevers, chills or abnormal night sweats (+) good appetite  Eyes: Denies blurriness of vision, double vision or watery eyes Ears, nose, mouth, throat, and face: Denies mucositis or  sore throat Respiratory: (+) chest pain from COPD Cardiovascular: Denies palpitation or lower extremity swelling   Gastrointestinal:  Denies heartburn or change in bowel habits (+) nausea and vomiting from treatment (+) lower abdominal pain  Skin: Denies abnormal skin rashes (+) pain from port  Lymphatics: Denies new lymphadenopathy or easy bruising Neurological:Denies numbness, tingling or new weaknesses MSK: (+) bilateral leg weakness/slight pain  Behavioral/Psych: Mood is stable, no new changes  All other systems were reviewed with the patient and are negative.   PHYSICAL EXAMINATION:  ECOG PERFORMANCE STATUS: 2 - Symptomatic, <50% confined to bed  Vitals:   07/24/17 1141  BP: 127/80  Pulse: 84  Resp: 16  Temp: 97.7 F (36.5 C)  SpO2: 97%   Filed Weights   07/24/17 1141  Weight: 116 lb 8 oz (52.8 kg)    GENERAL:alert, no distress and comfortable SKIN: skin color, texture, turgor are normal, no rashes or significant lesions (+) mild erythema at port site, no sign of infection  EYES: normal, conjunctiva are pink and non-injected, sclera clear OROPHARYNX:no exudate, no erythema and lips, buccal mucosa, and tongue normal  NECK: supple, thyroid normal size, non-tender, without nodularity LYMPH:  no palpable lymphadenopathy in the cervical, axillary or inguinal LUNGS: clear to auscultation and percussion with normal breathing effort (+) decreased breast sounds bilaterally.   HEART: regular rate & rhythm and no murmurs and no lower extremity edema ABDOMEN:abdomen soft, non-tender and normal bowel sounds  Musculoskeletal:no cyanosis of digits and no clubbing  PSYCH: alert & oriented x 3 with fluent speech NEURO: no focal motor/sensory deficits  LABORATORY DATA:  I have reviewed the data as listed CBC Latest Ref Rng & Units 07/24/2017 07/11/2017 07/06/2017  WBC 4.0 - 10.3 K/uL 17.6(H) 7.5 7.8  Hemoglobin 13.0 - 17.1 g/dL 15.8 15.0 14.9  Hematocrit  38.4 - 49.9 % 48.0 43.7 44.5    Platelets 140 - 400 K/uL 264 165 166     CMP Latest Ref Rng & Units 07/24/2017 07/11/2017 07/06/2017  Glucose 70 - 140 mg/dL 78 109 77  BUN 7 - 26 mg/dL 22 9.8 10.2  Creatinine 0.70 - 1.30 mg/dL 0.64(L) 0.7 0.6(L)  Sodium 136 - 145 mmol/L 139 139 139  Potassium 3.5 - 5.1 mmol/L 3.8 3.5 3.8  Chloride 98 - 109 mmol/L 101 - -  CO2 22 - 29 mmol/L 29 32(H) 30(H)  Calcium 8.4 - 10.4 mg/dL 8.5 9.0 8.6  Total Protein 6.4 - 8.3 g/dL 6.4 7.1 6.6  Total Bilirubin 0.2 - 1.2 mg/dL 0.3 0.54 0.30  Alkaline Phos 40 - 150 U/L 85 116 110  AST 5 - 34 U/L 20 15 13   ALT 0 - 55 U/L 60(H) 17 14    CEA  06/19/17: 3.38 07/06/17: 2.88   PATHOLOGY  Diagnosis 05/24/17  1. Stomach, biopsy, mass distal - ADENOCARCINOMA, SEE COMMENT. - CHRONIC ACTIVE GASTRITIS WITH INTESTINAL METAPLASIA. 2. 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 - NO DYSPLASIA OR MALIGNANCY. Microscopic Comment 1. The fragments are superficial and therefore assessment of invasion is hampered. Dr. Saralyn Pilar has reviewed the case. The case was called to Dr. [name] on 05/25/2017. 2. A Warthin-Starry stain is performed to determine the possibility of the presence of Helicobacter pylori. The Warthin-Starry stain is negative for organisms of Helicobacter pylori   PROCEDURES  EUS by Dr. [name] on 06/07/17  IMPRESSION:  5cm across, partially circumferential uT2N0 gastric adenocarcinoma laying in the distal stomach 3-4cm proximal to the pylorus.    EGD by Dr. [name] 05/24/17 IMPRESSION Ulcerated mass like region in the distal stomach (4cm across), extensively biopsied. This appears neoplastic. - Abnormal mucosa throughout stomach otherwise (mild-moderate gastritis), also extensively biopsied.   RADIOGRAPHIC STUDIES: I have personally reviewed the radiological images as listed and agreed with the findings in the report. Ir US Guide Vasc Access  Right  Result Date: 07/05/2017 CLINICAL DATA:  GASTRIC CANCER, ACCESS FOR CHEMOTHERAPY EXAM: RIGHT INTERNAL JUGULAR SINGLE LUMEN POWER PORT CATHETER INSERTION Date:  12/27/201812/27/2018 1:09 pm Radiologist:  M. Daryll Brod, MD Guidance:  Ultrasound and fluoroscopic MEDICATIONS: Ancef 2 g; The antibiotic was administered within an appropriate time interval prior to skin puncture. ANESTHESIA/SEDATION: Versed 1.5 mg IV; Fentanyl 75 mcg IV; Moderate Sedation Time:  30 minutes The patient was continuously monitored during the procedure by the interventional radiology nurse under my direct supervision. FLUOROSCOPY TIME:  36 seconds (1 mGy) COMPLICATIONS: None immediate. CONTRAST:  None. PROCEDURE: Informed consent was obtained from the patient following explanation of the procedure, risks, benefits and alternatives. The patient understands, agrees and consents for the procedure. All questions were addressed. A time out was performed. Maximal barrier sterile technique utilized including caps, mask, sterile gowns, sterile gloves, large sterile drape, hand hygiene, and 2% chlorhexidine scrub. Under sterile conditions and local anesthesia, right internal jugular micropuncture venous access was performed. Access was performed with ultrasound. Images were obtained for documentation. A guide wire was inserted followed by a transitional dilator. This allowed insertion of a guide wire and catheter into the IVC. Measurements were obtained from the SVC / RA junction back to the right IJ venotomy site. In the right infraclavicular chest, a subcutaneous pocket was created over the second anterior rib. This was Caldwell under sterile conditions and local anesthesia. 1% lidocaine with epinephrine was utilized  for this. A 2.5 cm incision was made in the skin. Blunt dissection was performed to create a subcutaneous pocket over the right pectoralis major muscle. The pocket was flushed with saline vigorously. There was adequate hemostasis.  The port catheter was assembled and checked for leakage. The port catheter was secured in the pocket with two retention sutures. The tubing was tunneled subcutaneously to the right venotomy site and inserted into the SVC/RA junction through a valved peel-away sheath. Position was confirmed with fluoroscopy. Images were obtained for documentation. The patient tolerated the procedure well. No immediate complications. Incisions were closed in a two layer fashion with 4 - 0 Vicryl suture. Dermabond was applied to the skin. The port catheter was accessed, blood was aspirated followed by saline and heparin flushes. Needle was removed. A dry sterile dressing was applied. IMPRESSION: Ultrasound and fluoroscopically guided right internal jugular single lumen power port catheter insertion. Tip in the SVC/RA junction. Catheter ready for use. Electronically Signed   By: Jerilynn Mages.  Shick M.D.   On: 07/05/2017 13:14   Ir Fluoro Guide Port Insertion Right  Result Date: 07/05/2017 CLINICAL DATA:  GASTRIC CANCER, ACCESS FOR CHEMOTHERAPY EXAM: RIGHT INTERNAL JUGULAR SINGLE LUMEN POWER PORT CATHETER INSERTION Date:  12/27/201812/27/2018 1:09 pm Radiologist:  M. Daryll Brod, MD Guidance:  Ultrasound and fluoroscopic MEDICATIONS: Ancef 2 g; The antibiotic was administered within an appropriate time interval prior to skin puncture. ANESTHESIA/SEDATION: Versed 1.5 mg IV; Fentanyl 75 mcg IV; Moderate Sedation Time:  30 minutes The patient was continuously monitored during the procedure by the interventional radiology nurse under my direct supervision. FLUOROSCOPY TIME:  36 seconds (1 mGy) COMPLICATIONS: None immediate. CONTRAST:  None. PROCEDURE: Informed consent was obtained from the patient following explanation of the procedure, risks, benefits and alternatives. The patient understands, agrees and consents for the procedure. All questions were addressed. A time out was performed. Maximal barrier sterile technique utilized including caps,  mask, sterile gowns, sterile gloves, large sterile drape, hand hygiene, and 2% chlorhexidine scrub. Under sterile conditions and local anesthesia, right internal jugular micropuncture venous access was performed. Access was performed with ultrasound. Images were obtained for documentation. A guide wire was inserted followed by a transitional dilator. This allowed insertion of a guide wire and catheter into the IVC. Measurements were obtained from the SVC / RA junction back to the right IJ venotomy site. In the right infraclavicular chest, a subcutaneous pocket was created over the second anterior rib. This was Caldwell under sterile conditions and local anesthesia. 1% lidocaine with epinephrine was utilized for this. A 2.5 cm incision was made in the skin. Blunt dissection was performed to create a subcutaneous pocket over the right pectoralis major muscle. The pocket was flushed with saline vigorously. There was adequate hemostasis. The port catheter was assembled and checked for leakage. The port catheter was secured in the pocket with two retention sutures. The tubing was tunneled subcutaneously to the right venotomy site and inserted into the SVC/RA junction through a valved peel-away sheath. Position was confirmed with fluoroscopy. Images were obtained for documentation. The patient tolerated the procedure well. No immediate complications. Incisions were closed in a two layer fashion with 4 - 0 Vicryl suture. Dermabond was applied to the skin. The port catheter was accessed, blood was aspirated followed by saline and heparin flushes. Needle was removed. A dry sterile dressing was applied. IMPRESSION: Ultrasound and fluoroscopically guided right internal jugular single lumen power port catheter insertion. Tip in the SVC/RA junction. Catheter ready for use.  Electronically Signed   By: Jerilynn Mages.  Shick M.D.   On: 07/05/2017 13:14    CT CAP W Contrast 06/22/17  IMPRESSION: 1. The gastric mass is somewhat inconspicuous on  CT, but a potential 1.2 cm thick mucosal masses seen along the posterior gastric border on image 83/2. There several small adjacent right gastric lymph nodes measuring up to 0.7 cm in short axis, but no overtly pathologic adenopathy and no findings of hepatic or peritoneal metastatic disease at this time. 2. Calcified granulomas in both lungs along with several noncalcified nodules stable from August 2016 and likely benign given this long-term stability. 3. Other imaging findings of potential clinical significance: Aortic Atherosclerosis (ICD10-I70.0) and Emphysema (ICD10-J43.9). Airway thickening is present, suggesting bronchitis or reactive airways disease. 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 Disc bulges at L3-4 and L4-5.   CT CAP 04/02/17 IMPRESSION: 1. Aortic atherosclerosis with fusiform ectasia of the infrarenal abdominal aorta which measures up to 2.6 x 2.3 cm, and contains a large burden of mural thrombus. 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 2. Today's study was not a CTA examination, which limits assessment of vascular abnormalities. Additionally, secondary to extensive patient respiratory motion, accurate assessment of small vessels is limited. With these limitations in mind, there is potential stenosis of both the origin of the celiac axis and the origin of the inferior mesenteric artery. Given the patient's history of intermittent abdominal pain, further evaluation with CTA of the abdomen and pelvis should be considered if clinically appropriate, and if the patient can be counseled to adequately hold his breath during the examination to allow for optimal image quality. 3. No other acute findings are noted in the chest, abdomen or pelvis to account for the patient's symptoms. 4. Diffuse bronchial  wall thickening with moderate centrilobular and paraseptal emphysema; imaging findings suggestive of underlying COPD. 5. Multiple small pulmonary nodules measuring up to 6 mm, stable compared to prior examinations dating back take 02/22/2015, considered benign. No future follow-up needed. This recommendation follows the consensus statement: Guidelines for Management of Incidental Pulmonary Nodules Detected on CT Images: From the Fleischner Society 2017; Radiology 2017; 284:228-243. 6. Additional incidental findings, as above. Aortic Atherosclerosis (ICD10-I70.0) and Emphysema (ICD10-J43.9).   ASSESSMENT & PLAN:  Bobby Caldwell is a 62 y.o. [place] male with a history of heavy smoking and COPD, peripheral vascular disease, presents with fatigue, anorexia, early satiety and weight loss   1. Gastric Adenocarcinoma, uT2N0M0, stage I -I reviewed his EGD findings and biopsy pathology results that show gastric adenocarcinoma. He had CT chest, abdomen and pelvis with contrast for his peripheral vascular disease on 04/02/2017, which shows no sign of metastasis at that time.  -I will repeat staging scan to rule out new distant metastasis since last scan.  -His EUS showed a T2N0 lesion in distal gastric body -Due to his intensive peripheral vascular disease and recent bypass surgery, poor nutrition status, Dr. [name] not feel he is a great surgical candidate at this point, she recommended him to improve his nutrition status and to consider neoadjuvant chemotherapy first.  Giving the T2 lesion, neoadjuvant chemotherapy is reasonable and I agree with Dr. [name]. -The periop chemo FLOT4 (5-fu, oxaliplatin, and docetaxel) has showed superior outcome for gastric cancer. However, due to his poor nutrition status, comorbidities, and poor performance status, he may not be able to tolerate such intensive  chemotherapy.  I will consider FOLFOX as the first cycle of neoadjuvant chemo, his nutrition status improved and  he tolerates well, will consider change to FLOT4 -His cardiac workup with Dr. Ellyn Hack has been completed.  He has low possibility of cardiac ischemia and he is cleared to proceed with chemotherapy -We reviewed his CT CAP from 06/22/17 showed no other metastatic disease.  -He has started neoadjuvant FOLFOX every 2 weeks on 07/07/17. He tolerated moderately well, with some fatigue, nausea, dizziness (probable related to medication) -I reviewed his antiemetics regimen. He is to take Zofran or campazine 30 minutes before each meal and once between each meal as needed.  He also has Ativan and dexamethasone at home for nausea if needed.  He is fine to stop if he no longer experienced nausea. I strongly encouraged him to take supplemental food. I prescribed Mirtazapine to help his appetite.   - I went through pt's medication list with him and his family and wrote out which he should take daily and which he should take as needed for specific symptoms. His daughter agrees to help him manage his medications. -I advised him to not take OTC cough medication -I recommend that he continue chemo for now because his appetite and weight is stable, and his symptoms are manageable. He drinks 30-50 oz of water per day -CBC reviewed and WNL. (/15/19) He is okay to receive cycle FOLFOX today. Will slightly increase 5-fu dose to '2200mg'$ /m2, and keep oxaliplatin at '70mg'$ /m2 due to his tolerance issue -f/u in 2 weeks  2. Anorexia, nausea and weight loss  -He has loss 14 pounds over past 6 months  -f/u with dietician  -He currently takes ensure boosts daily.  -He has been gaining weight recently. I encouraged him to continue with supplement to gain more weight.  -I reviewed his antiemetic regimen, and encouraged him to take medication 30 minutes before each meal as needed.  -I prescribed mirtazapine today to help with his appetite.  -His appetite is much improved from mirtazapine  3. COPD, Smoking Cessation  -He reduced  from 50 cigars a day to 10 cigars a day. He is actively trying to quit. He has mild chronic cough  -I discussed his chest pain is due to his cough from COPD and smoking.   4. Financial support -I will refer him to our social worker for help with financial help, he is no longer able to work and would like apply for disability.   -His wife has not returned to work to help him at home. I recommend she apply for FMLA at her place of work.  -He will need assistance with transportation.  -He was seen by our SW   5. Personality change, ? Depression and or anxiety -Patient's daughter called me after his visit, she is concerned about his personality change lately, he has been agitated, gets upset quickly, and not sleeping well.  Daughter is concerned about depression and anxiety.  I think this is possible related to steroids, and or his side effect from chemo.   -I encouraged daughter to watch him at home, and call us if she has concerns. -Patient denied depression, he was seen by our social worker, and he declined counseling. -Has Ativan as needed for insomnia.  PLAN:  Will proceed with cycle 2 FOLFOX today, will slightly increase 5-FU dose, and satraplatin remains at 70 mg/m, due to his tolerance issue We will see him back in 2 weeks before cycle 3 I went over his  meds with pt and his daughter in details   No orders of the defined types were placed in this encounter.   All questions were answered. The patient knows to call the clinic with any problems, questions or concerns. I spent 25 minutes counseling the patient face to face. The total time spent in the appointment was 30 minutes and more than 50% was on counseling.     [name], MD 07/24/2017   This document serves as a record of services personally performed by [name], MD. It was created on her behalf by Theresia Bough, a trained medical scribe. The creation of this record is based on the scribe's personal observations and the  provider's statements to them.   I have reviewed the above documentation for accuracy and completeness, and I agree with the above.

## 2017-07-23 NOTE — Assessment & Plan Note (Signed)
With essentially normal Myoview and echocardiogram, I would suspect this is probably related to COPD and not cardiac.

## 2017-07-23 NOTE — Assessment & Plan Note (Signed)
He has not had any true anginal symptoms - with negative Myoview He doesn't necessarily have any heart failure symptoms either. He is nondiabetic, and has not had a stroke.  Intra-abdominal surgery would be high risk.  PREOPERATIVE CARDIAC RISK ASSESSMENT   Revised Cardiac Risk Index:  High Risk Surgery: yes; Intraperitoneal  Defined as Intraperitoneal, intrathoracic or suprainguinal vascular  Active CAD: no; Negative Myoview & no active Angina  CHF: no; Normal EF on Echo with only Gr 1 DD.  Cerebrovascular Disease: no; not diagnosed  Diabetes: no; On Insulin: no  CKD (Cr >~ 2): no;   Total: 1 Estimated Risk of Adverse Outcome: LOW RISK FROM CV STANDPOINT - BUT WITH COPD - LIKELY AT LEAST MODERATE RISK  Estimated Risk of MI, PE, VF/VT (Cardiac Arrest), Complete Heart Block: 1-3 %  Quite fortunately, despite having significant lower extremity PAD, he had a negative Myoview on the stress test and relatively normal echocardiogram. Certainly cannot exclude left main disease, but he has not had any true anginal symptoms.  No further cardiac testing required.

## 2017-07-23 NOTE — Assessment & Plan Note (Signed)
Not ready to quit.  

## 2017-07-23 NOTE — Assessment & Plan Note (Signed)
Controlled on no medications at this time. If he were to become hypertensive, would potentially consider bisoprolol for perioperative risk reduction

## 2017-07-24 ENCOUNTER — Encounter: Payer: Self-pay | Admitting: Hematology

## 2017-07-24 ENCOUNTER — Inpatient Hospital Stay: Payer: BLUE CROSS/BLUE SHIELD

## 2017-07-24 ENCOUNTER — Telehealth: Payer: Self-pay | Admitting: Hematology

## 2017-07-24 ENCOUNTER — Inpatient Hospital Stay: Payer: BLUE CROSS/BLUE SHIELD | Attending: Hematology | Admitting: Hematology

## 2017-07-24 VITALS — BP 127/80 | HR 84 | Temp 97.7°F | Resp 16 | Wt 116.5 lb

## 2017-07-24 DIAGNOSIS — J449 Chronic obstructive pulmonary disease, unspecified: Secondary | ICD-10-CM | POA: Diagnosis not present

## 2017-07-24 DIAGNOSIS — C169 Malignant neoplasm of stomach, unspecified: Secondary | ICD-10-CM | POA: Insufficient documentation

## 2017-07-24 DIAGNOSIS — R451 Restlessness and agitation: Secondary | ICD-10-CM

## 2017-07-24 DIAGNOSIS — G4700 Insomnia, unspecified: Secondary | ICD-10-CM | POA: Diagnosis not present

## 2017-07-24 DIAGNOSIS — C162 Malignant neoplasm of body of stomach: Secondary | ICD-10-CM

## 2017-07-24 DIAGNOSIS — C160 Malignant neoplasm of cardia: Secondary | ICD-10-CM

## 2017-07-24 DIAGNOSIS — I739 Peripheral vascular disease, unspecified: Secondary | ICD-10-CM

## 2017-07-24 DIAGNOSIS — R110 Nausea: Secondary | ICD-10-CM | POA: Diagnosis not present

## 2017-07-24 DIAGNOSIS — Z5111 Encounter for antineoplastic chemotherapy: Secondary | ICD-10-CM | POA: Insufficient documentation

## 2017-07-24 DIAGNOSIS — Z95828 Presence of other vascular implants and grafts: Secondary | ICD-10-CM

## 2017-07-24 DIAGNOSIS — F17290 Nicotine dependence, other tobacco product, uncomplicated: Secondary | ICD-10-CM

## 2017-07-24 DIAGNOSIS — E440 Moderate protein-calorie malnutrition: Secondary | ICD-10-CM

## 2017-07-24 DIAGNOSIS — I10 Essential (primary) hypertension: Secondary | ICD-10-CM

## 2017-07-24 DIAGNOSIS — R05 Cough: Secondary | ICD-10-CM | POA: Diagnosis not present

## 2017-07-24 DIAGNOSIS — R630 Anorexia: Secondary | ICD-10-CM

## 2017-07-24 DIAGNOSIS — J431 Panlobular emphysema: Secondary | ICD-10-CM

## 2017-07-24 LAB — CBC WITH DIFFERENTIAL/PLATELET
BASOS ABS: 0 10*3/uL (ref 0.0–0.1)
Band Neutrophils: 1 %
Basophils Relative: 0 %
Blasts: 0 %
Eosinophils Absolute: 0.4 10*3/uL (ref 0.0–0.5)
Eosinophils Relative: 2 %
HCT: 48 % (ref 38.4–49.9)
Hemoglobin: 15.8 g/dL (ref 13.0–17.1)
LYMPHS PCT: 39 %
Lymphs Abs: 6.9 10*3/uL — ABNORMAL HIGH (ref 0.9–3.3)
MCH: 28.8 pg (ref 27.2–33.4)
MCHC: 32.9 g/dL (ref 32.0–36.0)
MCV: 87.3 fL (ref 79.3–98.0)
METAMYELOCYTES PCT: 0 %
MONOS PCT: 4 %
Monocytes Absolute: 0.7 10*3/uL (ref 0.1–0.9)
Myelocytes: 0 %
NEUTROS ABS: 9.6 10*3/uL — AB (ref 1.5–6.5)
NEUTROS PCT: 54 %
OTHER: 0 %
PLATELETS: 264 10*3/uL (ref 140–400)
Promyelocytes Absolute: 0 %
RBC: 5.49 MIL/uL (ref 4.20–5.82)
RDW: 13.7 % (ref 11.0–15.6)
WBC: 17.6 10*3/uL — ABNORMAL HIGH (ref 4.0–10.3)
nRBC: 0 /100 WBC

## 2017-07-24 LAB — COMPREHENSIVE METABOLIC PANEL
ALT: 60 U/L — AB (ref 0–55)
AST: 20 U/L (ref 5–34)
Albumin: 3.5 g/dL (ref 3.5–5.0)
Alkaline Phosphatase: 85 U/L (ref 40–150)
Anion gap: 9 (ref 3–11)
BUN: 22 mg/dL (ref 7–26)
CALCIUM: 8.5 mg/dL (ref 8.4–10.4)
CHLORIDE: 101 mmol/L (ref 98–109)
CO2: 29 mmol/L (ref 22–29)
CREATININE: 0.64 mg/dL — AB (ref 0.70–1.30)
GFR calc Af Amer: >60 mL/min (ref >60)
Glucose, Bld: 78 mg/dL (ref 70–140)
Potassium: 3.8 mmol/L (ref 3.5–5.1)
Sodium: 139 mmol/L (ref 136–145)
TOTAL PROTEIN: 6.4 g/dL (ref 6.4–8.3)
Total Bilirubin: 0.3 mg/dL (ref 0.2–1.2)

## 2017-07-24 MED ORDER — DEXAMETHASONE SODIUM PHOSPHATE 10 MG/ML IJ SOLN
10.0000 mg | Freq: Once | INTRAMUSCULAR | Status: AC
  Administered 2017-07-24: 10 mg via INTRAVENOUS

## 2017-07-24 MED ORDER — DEXAMETHASONE SODIUM PHOSPHATE 10 MG/ML IJ SOLN
INTRAMUSCULAR | Status: AC
  Filled 2017-07-24: qty 1

## 2017-07-24 MED ORDER — DEXTROSE 5 % IV SOLN
Freq: Once | INTRAVENOUS | Status: AC
  Administered 2017-07-24: 13:00:00 via INTRAVENOUS

## 2017-07-24 MED ORDER — PALONOSETRON HCL INJECTION 0.25 MG/5ML
0.2500 mg | Freq: Once | INTRAVENOUS | Status: AC
Start: 2017-07-24 — End: 2017-07-24
  Administered 2017-07-24: 0.25 mg via INTRAVENOUS

## 2017-07-24 MED ORDER — SODIUM CHLORIDE 0.9% FLUSH
10.0000 mL | Freq: Once | INTRAVENOUS | Status: AC
  Administered 2017-07-24: 10 mL
  Filled 2017-07-24: qty 10

## 2017-07-24 MED ORDER — OXALIPLATIN CHEMO INJECTION 100 MG/20ML
70.0000 mg/m2 | Freq: Once | INTRAVENOUS | Status: AC
  Administered 2017-07-24: 110 mg via INTRAVENOUS
  Filled 2017-07-24: qty 2

## 2017-07-24 MED ORDER — PALONOSETRON HCL INJECTION 0.25 MG/5ML
INTRAVENOUS | Status: AC
Start: 2017-07-24 — End: 2017-07-24
  Filled 2017-07-24: qty 5

## 2017-07-24 MED ORDER — SODIUM CHLORIDE 0.9 % IV SOLN
2200.0000 mg/m2 | INTRAVENOUS | Status: DC
  Administered 2017-07-24: 3400 mg via INTRAVENOUS
  Filled 2017-07-24: qty 68

## 2017-07-24 MED ORDER — DEXTROSE 5 % IV SOLN
400.0000 mg/m2 | Freq: Once | INTRAVENOUS | Status: AC
  Administered 2017-07-24: 616 mg via INTRAVENOUS
  Filled 2017-07-24: qty 30.8

## 2017-07-24 NOTE — Progress Notes (Signed)
Met with patient and family members to introduce myself as Arboriculturist and to offer him the opportunity to apply for the one-time $400 [employer]. Daughter explained to patient and he verbalized understanding. Neither him nor spouse currently have income. Daughter wrote a letter of support. Approved patient for grant and gave a copy of the approval as well as the expense sheet along with the outpatient pharmacy information. Explained what it covers and how items may be submitted. They received a gas card today from the grant. They have my card for any additional financial questions or concerns.  Arrived to lab.

## 2017-07-24 NOTE — Patient Instructions (Signed)
[facility] Discharge Instructions for Patients Receiving Chemotherapy  Today you received the following chemotherapy agents Oxaliplatin, Leucovorin and Adrucil   To help prevent nausea and vomiting after your treatment, we encourage you to take your nausea medication as directed.    If you develop nausea and vomiting that is not controlled by your nausea medication, call the clinic.   BELOW ARE SYMPTOMS THAT SHOULD BE REPORTED IMMEDIATELY:  *FEVER GREATER THAN 100.5 F  *CHILLS WITH OR WITHOUT FEVER  NAUSEA AND VOMITING THAT IS NOT CONTROLLED WITH YOUR NAUSEA MEDICATION  *UNUSUAL SHORTNESS OF BREATH  *UNUSUAL BRUISING OR BLEEDING  TENDERNESS IN MOUTH AND THROAT WITH OR WITHOUT PRESENCE OF ULCERS  *URINARY PROBLEMS  *BOWEL PROBLEMS  UNUSUAL RASH Items with * indicate a potential emergency and should be followed up as soon as possible.  Feel free to call the clinic should you have any questions or concerns. The clinic phone number is [phone].  Please show the CHEMO ALERT CARD at check-in to the Emergency Department and triage nurse.   

## 2017-07-24 NOTE — Telephone Encounter (Signed)
No 11/5 los.  °

## 2017-07-26 ENCOUNTER — Inpatient Hospital Stay (HOSPITAL_BASED_OUTPATIENT_CLINIC_OR_DEPARTMENT_OTHER): Payer: BLUE CROSS/BLUE SHIELD

## 2017-07-26 VITALS — BP 127/87 | HR 95 | Temp 97.9°F | Resp 16

## 2017-07-26 DIAGNOSIS — Z5111 Encounter for antineoplastic chemotherapy: Secondary | ICD-10-CM | POA: Diagnosis not present

## 2017-07-26 DIAGNOSIS — C162 Malignant neoplasm of body of stomach: Secondary | ICD-10-CM

## 2017-07-26 MED ORDER — SODIUM CHLORIDE 0.9% FLUSH
10.0000 mL | INTRAVENOUS | Status: DC | PRN
  Administered 2017-07-26: 10 mL
  Filled 2017-07-26: qty 10

## 2017-07-26 MED ORDER — HEPARIN SOD (PORK) LOCK FLUSH 100 UNIT/ML IV SOLN
500.0000 [IU] | Freq: Once | INTRAVENOUS | Status: AC | PRN
  Administered 2017-07-26: 500 [IU]
  Filled 2017-07-26: qty 5

## 2017-08-01 ENCOUNTER — Telehealth: Payer: Self-pay | Admitting: Hematology

## 2017-08-01 NOTE — Telephone Encounter (Signed)
08/01/17 @ 4:39 pm called and spoke with patient to confirm Disability paperwork was successfully faxed to [facility] @ [phone] on 07/20/17  1:24 pm

## 2017-08-02 ENCOUNTER — Ambulatory Visit: Payer: BLUE CROSS/BLUE SHIELD

## 2017-08-02 ENCOUNTER — Other Ambulatory Visit: Payer: BLUE CROSS/BLUE SHIELD

## 2017-08-02 ENCOUNTER — Ambulatory Visit: Payer: BLUE CROSS/BLUE SHIELD | Admitting: Hematology

## 2017-08-05 NOTE — Progress Notes (Signed)
[place]  [phone] [phone]  Clinic Follow Up Note   Patient Care Team: Minette Brine as PCP - General (General Practice) [name], MD as Consulting Physician (Pulmonary Disease) [name], MD as Consulting Physician (Hematology) [name], MD as Attending Physician (Gastroenterology) [name], MD as Consulting Physician (General Surgery)   Date of Service: 08/06/2017   CHIEF COMPLAINTS:  Follow up gastric Adenocarcinoma   Oncology History   Cancer Staging Gastric cancer [facility]) Staging form: Stomach, AJCC 8th Edition - Clinical stage from 05/24/2017: Stage I (cT2, cN0, cM0) - Signed by [name], MD on 06/19/2017       Gastric cancer ([place])   05/24/2017 Procedure    EGD by Dr. [name] 05/24/17 IMPRESSION Ulcerated mass like region in the distal stomach (4cm across), extensively biopsied. This appears neoplastic. - Abnormal mucosa throughout stomach otherwise (mild-moderate gastritis), also extensively biopsied.      05/24/2017 Initial Biopsy    Diagnosis 05/24/17  1. Stomach, biopsy, mass distal - ADENOCARCINOMA, SEE COMMENT. - CHRONIC ACTIVE GASTRITIS WITH INTESTINAL METAPLASIA. 2. 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 - NO DYSPLASIA OR MALIGNANCY      06/04/2017 Initial Diagnosis    Gastric cancer ([place])      06/07/2017 Procedure    EUS by Dr. [name] on 06/07/17  IMPRESSION:  5cm across, partially circumferential uT2N0 gastric adenocarcinoma laying in the distal stomach 3-4cm proximal to the pylorus.      06/22/2017 Imaging    CT CAP W Contrast 06/22/17  IMPRESSION: 1. The gastric mass is somewhat inconspicuous on CT, but a potential 1.2 cm thick mucosal masses seen along the posterior gastric border on image 83/2. There several small adjacent right gastric lymph nodes measuring up to 0.7 cm in short axis, but  no overtly pathologic adenopathy and no findings of hepatic or peritoneal metastatic disease at this time. 2. Calcified granulomas in both lungs along with several noncalcified nodules stable from August 2016 and likely benign given this long-term stability. 3. Other imaging findings of potential clinical significance: Aortic Atherosclerosis (ICD10-I70.0) and Emphysema (ICD10-J43.9). Airway thickening is present, suggesting bronchitis or reactive airways disease. 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 Disc bulges at L3-4 and L4-5.      07/05/2017 Surgery    PAC placement       07/07/2017 -  Chemotherapy    Neoadjuvant FOLFOX with with leucovorin every 2 weeks for 6 cycles starting 07/07/17. Will start with reduced dose for cycle 1.         HISTORY OF PRESENTING ILLNESS: 06/05/17  Bobby Caldwell 62 y.o. male is here because of newly diagnosed Gastric Cancer. He was referred by Dr. [name]. He presents to the clinic today accompanied by an albanian interpretor and then helped by video interpretor [phone] to help translate his visit.   In the past he was diagnosed with COPD controlled with inhalers. He has smoked for several years and is down to smoking 10 cigars a day. He is actively trying to quit.   Today he notes he is upset about his diagnosis and he recently had bypass surgery on his bilateral legs in 04/2017 and feels fatigued from this. He met with Dr. [name] today and he was told he will need chemo and surgery. He is willing to go with whatever is recommended.  He notes this started with issues with  both legs feeling weak and shaking. This occurred for 2-3 months. He had bypass surgery with Dr. Bridgett Larsson. He does not walk much so he has not tested how well he can walk much. The 03/2017 CT scan before his surgery showed thickening in his stomach which is why he was referred to Dr. [name]. Patient notes his stomach  issues has been ongoing, but recently has burning sensation below umbilical area since endoscopy. This was only mildly presents prior to procedure. His appetite has not changed much. He doesn't dare to eat much as he is afraid to vomit. He has felt this way for th past 6 months. He currently loss 14 pounds in the past 6 months. He does not go out much anymore, he mostly stays in his house. He is able to do ALDs himself, his wife helps out around the house and cares for him. He has 4 children, 37-21yo. His wife does not work. He does not find himself feeling up to working anymore, he has no energy to do anything.  He notes pain in his chest from his COPD. He uses inhalers 3 times a day. He would like to do the surgery alone but if chemo is needed he is fine with proceeding with treatment. He reports his body shakes when he gets blood drawn. He is willing to try oral iron in the meantime. He would like to be seen by social worker to help him our with finances as he is not able to work. He feels he often has to break due to sever fatigue and weakness.     CURRENT THERAPY: neoadjuvant FOLFOX with leucovorin every 2 weeks for 6-8 cycles starting 07/07/17    INTERVAL HISTORY:  Bobby Caldwell is here for a follow up and cycle 3 of FOLFOX. He reports continued insomnia. He wakes up every 90 minutes due to abdominal pain and dyspnea. This is worse after chemo and has gotten better. Other than that he has no other complaints with chemo currently. He reports some leg swelling but he does not have any today. He is having vascular surgery on 09/12/17. He is unsure of when his appointment is with Dr. [name]. She would like him to gain weight and it is unchanged today. He endorses a good appetite.   On review of systems, pt denies abnormal cough, or any other complaints at this time. Pertinent positives are listed and detailed within the above HPI.   MEDICAL HISTORY:  Past Medical History:  Diagnosis Date  . COPD  (chronic obstructive pulmonary disease) ([place])   . Dyspnea   . PAD (peripheral artery disease) ([place])   . Stomach cancer ([place])   . Tobacco abuse   . Weight loss     SURGICAL HISTORY: Past Surgical History:  Procedure Laterality Date  . ABDOMINAL AORTOGRAM W/LOWER EXTREMITY N/A 05/03/2017   Procedure: ABDOMINAL AORTOGRAM W/LOWER EXTREMITY;  Surgeon: Conrad Potts[place], MD;  Location: [place] [facility];  Service: Cardiovascular;  Laterality: N/A;  . ESOPHAGOGASTRODUODENOSCOPY     05/24/17  . ESOPHAGOGASTRODUODENOSCOPY (EGD) WITH PROPOFOL N/A 05/24/2017   Procedure: ESOPHAGOGASTRODUODENOSCOPY (EGD) WITH PROPOFOL;  Surgeon: [name], MD;  Location: [facility];  Service: Endoscopy;  Laterality: N/A;  . EUS N/A 06/07/2017   Procedure: UPPER ENDOSCOPIC ULTRASOUND (EUS) RADIAL;  Surgeon: [name], MD;  Location: [facility];  Service: Endoscopy;  Laterality: N/A;  . FEMORAL-FEMORAL BYPASS GRAFT Bilateral 05/08/2017   Procedure: LEFT TO RIGHT BYPASS GRAFT FEMORAL-FEMORAL ARTERY;  Surgeon: Adele Barthel  L, MD;  Location: [place] [facility];  Service: Vascular;  Laterality: Bilateral;  . IR FLUORO GUIDE PORT INSERTION RIGHT  07/05/2017  . IR US GUIDE VASC ACCESS RIGHT  07/05/2017  . NM MYOVIEW LTD  06/2017   LOW RISK. Small basal inferoseptal defect thought to be related to artifact (although cannot exclude prior infarct). Normal wall motion i in this area would suggest artifact not infarct. Otherwise no ischemia or infarction.  Marland Kitchen PERIPHERAL VASCULAR INTERVENTION Left 05/03/2017   Procedure: PERIPHERAL VASCULAR INTERVENTION;  Surgeon: Conrad Elbing, MD;  Location: [place] [facility];  Service: Cardiovascular;  Laterality: Left;  common iliac  . TRANSTHORACIC ECHOCARDIOGRAM  06/26/2017   Normal LV size and function. EF 60-65%. No wall motion abnormalities. GR 1 DD. Mild LA dilation    SOCIAL HISTORY: Social History   Socioeconomic History  . Marital status: Married    Spouse name: Not on file    . Number of children: 4  . Years of education: Not on file  . Highest education level: Not on file  Social Needs  . Financial resource strain: Not on file  . Food insecurity - worry: Not on file  . Food insecurity - inability: Not on file  . Transportation needs - medical: Not on file  . Transportation needs - non-medical: Not on file  Occupational History  . Not on file  Tobacco Use  . Smoking status: Current Every Day Smoker    Packs/day: 1.00    Years: 50.00    Pack years: 50.00    Types: Cigarettes  . Smokeless tobacco: Never Used  . Tobacco comment: 10 cigarettes per day  Substance and Sexual Activity  . Alcohol use: No    Alcohol/week: 0.0 oz  . Drug use: No  . Sexual activity: Yes  Other Topics Concern  . Not on file  Social History Narrative  . Not on file    FAMILY HISTORY: Family History  Family history unknown: Yes    ALLERGIES:  has No Known Allergies.  MEDICATIONS:  Current Outpatient Medications  Medication Sig Dispense Refill  . Albuterol Sulfate (PROAIR RESPICLICK) 747 (90 Base) MCG/ACT AEPB Inhale 2 puffs into the lungs every 6 (six) hours as needed. 1 each 6  . atorvastatin (LIPITOR) 10 MG tablet Take 1 tablet (10 mg total) by mouth daily. 30 tablet 2  . budesonide-formoterol (SYMBICORT) 160-4.5 MCG/ACT inhaler Inhale 2 puffs into the lungs 2 (two) times daily. 1 Inhaler 6  . clopidogrel (PLAVIX) 75 MG tablet Take 1 tablet (75 mg total) by mouth daily. 30 tablet 11  . dexamethasone (DECADRON) 4 MG tablet Take 2 tablets (8 mg total) by mouth daily. Start the day after chemotherapy for 2 days. Take with food. 30 tablet 1  . lidocaine-prilocaine (EMLA) cream Apply to affected area once 30 g 3  . loperamide (IMODIUM A-D) 2 MG tablet Take 2 mg by mouth 4 (four) times daily as needed for diarrhea or loose stools.    Marland Kitchen LORazepam (ATIVAN) 0.5 MG tablet Take 1 tablet (0.5 mg total) by mouth every 6 (six) hours as needed (Nausea or vomiting). 30 tablet 0  .  mirtazapine (REMERON) 7.5 MG tablet Take 1 tablet (7.5 mg total) by mouth at bedtime. 30 tablet 1  . ondansetron (ZOFRAN) 8 MG tablet Take 1 tablet (8 mg total) by mouth 2 (two) times daily as needed for refractory nausea / vomiting. Start on day 3 after chemotherapy. 30 tablet 1  . oxyCODONE-acetaminophen (ROXICET) 5-325  MG tablet Take 1 tablet by mouth every 6 (six) hours as needed for severe pain. 20 tablet 0  . prochlorperazine (COMPAZINE) 10 MG tablet Take 1 tablet (10 mg total) by mouth every 6 (six) hours as needed (Nausea or vomiting). 30 tablet 1  . Tiotropium Bromide Monohydrate (SPIRIVA RESPIMAT) 2.5 MCG/ACT AERS Inhale 2 puffs into the lungs daily. (Patient taking differently: Inhale 1 puff into the lungs daily. ) 1 Inhaler 6  . traMADol (ULTRAM) 50 MG tablet Take 1 tablet (50 mg total) by mouth every 6 (six) hours as needed. 30 tablet 0   No current facility-administered medications for this visit.     REVIEW OF SYSTEMS:  Constitutional: Denies fevers, chills or abnormal night sweats (+) good appetite (+) insomnia Eyes: Denies blurriness of vision, double vision or watery eyes Ears, nose, mouth, throat, and face: Denies mucositis or sore throat Respiratory: (+) chest pain from COPD Cardiovascular: Denies palpitation or lower extremity swelling   Gastrointestinal:  Denies heartburn or change in bowel habits (+) nausea and vomiting from treatment (+) lower abdominal pain  Skin: Denies abnormal skin rashes (+) pain from port  Lymphatics: Denies new lymphadenopathy or easy bruising Neurological:Denies numbness, tingling or new weaknesses MSK: (+) bilateral leg weakness/slight pain  Behavioral/Psych: Mood is stable, no new changes  All other systems were reviewed with the patient and are negative.   PHYSICAL EXAMINATION:  ECOG PERFORMANCE STATUS: 2 - Symptomatic, <50% confined to bed  Vitals:   08/06/17 1119  BP: (!) 159/81  Pulse: 80  Resp: 20  Temp: 98.3 F (36.8 C)    SpO2: 97%   Filed Weights   08/06/17 1119  Weight: 117 lb 11.2 oz (53.4 kg)    GENERAL:alert, no distress and comfortable SKIN: skin color, texture, turgor are normal, no rashes or significant lesions (+) mild erythema at port site, no sign of infection  EYES: normal, conjunctiva are pink and non-injected, sclera clear OROPHARYNX:no exudate, no erythema and lips, buccal mucosa, and tongue normal  NECK: supple, thyroid normal size, non-tender, without nodularity LYMPH:  no palpable lymphadenopathy in the cervical, axillary or inguinal LUNGS: clear to auscultation and percussion with normal breathing effort (+) decreased breast sounds bilaterally.   HEART: regular rate & rhythm and no murmurs and no lower extremity edema ABDOMEN:abdomen soft, non-tender and normal bowel sounds  Musculoskeletal:no cyanosis of digits and no clubbing  PSYCH: alert & oriented x 3 with fluent speech NEURO: no focal motor/sensory deficits  LABORATORY DATA:  I have reviewed the data as listed CBC Latest Ref Rng & Units 08/06/2017 07/24/2017 07/11/2017  WBC 4.0 - 10.3 K/uL 9.0 17.6(H) 7.5  Hemoglobin 13.0 - 17.1 g/dL 15.2 15.8 15.0  Hematocrit 38.4 - 49.9 % 44.8 48.0 43.7  Platelets 140 - 400 K/uL 141 264 165     CMP Latest Ref Rng & Units 08/06/2017 07/24/2017 07/11/2017  Glucose 70 - 140 mg/dL 78 78 109  BUN 7 - 26 mg/dL 7 22 9.8  Creatinine 0.70 - 1.30 mg/dL 0.61(L) 0.64(L) 0.7  Sodium 136 - 145 mmol/L 139 139 139  Potassium 3.5 - 5.1 mmol/L 4.2 3.8 3.5  Chloride 98 - 109 mmol/L 102 101 -  CO2 22 - 29 mmol/L 31(H) 29 32(H)  Calcium 8.4 - 10.4 mg/dL 8.9 8.5 9.0  Total Protein 6.4 - 8.3 g/dL 6.7 6.4 7.1  Total Bilirubin 0.2 - 1.2 mg/dL 0.4 0.3 0.54  Alkaline Phos 40 - 150 U/L 117 85 116  AST 5 -  34 U/L 16 20 15   ALT 0 - 55 U/L 30 60(H) 17    CEA  06/19/17: 3.38 07/06/17: 2.88   PATHOLOGY  Diagnosis 05/24/17  1. Stomach, biopsy, mass distal - ADENOCARCINOMA, SEE COMMENT. - CHRONIC ACTIVE  GASTRITIS WITH INTESTINAL METAPLASIA. 2. 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 - NO DYSPLASIA OR MALIGNANCY. Microscopic Comment 1. The fragments are superficial and therefore assessment of invasion is hampered. Dr. Saralyn Pilar has reviewed the case. The case was called to Dr. [name] on 05/25/2017. 2. A Warthin-Starry stain is performed to determine the possibility of the presence of Helicobacter pylori. The Warthin-Starry stain is negative for organisms of Helicobacter pylori   PROCEDURES  EUS by Dr. [name] on 06/07/17  IMPRESSION:  5cm across, partially circumferential uT2N0 gastric adenocarcinoma laying in the distal stomach 3-4cm proximal to the pylorus.    EGD by Dr. [name] 05/24/17 IMPRESSION Ulcerated mass like region in the distal stomach (4cm across), extensively biopsied. This appears neoplastic. - Abnormal mucosa throughout stomach otherwise (mild-moderate gastritis), also extensively biopsied.   RADIOGRAPHIC STUDIES: I have personally reviewed the radiological images as listed and agreed with the findings in the report. No results found.  CT CAP W Contrast 06/22/17  IMPRESSION: 1. The gastric mass is somewhat inconspicuous on CT, but a potential 1.2 cm thick mucosal masses seen along the posterior gastric border on image 83/2. There several small adjacent right gastric lymph nodes measuring up to 0.7 cm in short axis, but no overtly pathologic adenopathy and no findings of hepatic or peritoneal metastatic disease at this time. 2. Calcified granulomas in both lungs along with several noncalcified nodules stable from August 2016 and likely benign given this long-term stability. 3. Other imaging findings of potential clinical significance: Aortic Atherosclerosis (ICD10-I70.0) and Emphysema (ICD10-J43.9). Airway thickening is present, suggesting bronchitis or reactive airways disease.  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 Disc bulges at L3-4 and L4-5.   CT CAP 04/02/17 IMPRESSION: 1. Aortic atherosclerosis with fusiform ectasia of the infrarenal abdominal aorta which measures up to 2.6 x 2.3 cm, and contains a large burden of mural thrombus. 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 2. Today's study was not a CTA examination, which limits assessment of vascular abnormalities. Additionally, secondary to extensive patient respiratory motion, accurate assessment of small vessels is limited. With these limitations in mind, there is potential stenosis of both the origin of the celiac axis and the origin of the inferior mesenteric artery. Given the patient's history of intermittent abdominal pain, further evaluation with CTA of the abdomen and pelvis should be considered if clinically appropriate, and if the patient can be counseled to adequately hold his breath during the examination to allow for optimal image quality. 3. No other acute findings are noted in the chest, abdomen or pelvis to account for the patient's symptoms. 4. Diffuse bronchial wall thickening with moderate centrilobular and paraseptal emphysema; imaging findings suggestive of underlying COPD. 5. Multiple small pulmonary nodules measuring up to 6 mm, stable compared to prior examinations dating back take 02/22/2015, considered benign. No future follow-up needed. This recommendation follows the consensus statement: Guidelines for Management of Incidental Pulmonary Nodules Detected on CT Images: From the Fleischner Society 2017; Radiology 2017; 284:228-243. 6. Additional incidental findings, as above. Aortic Atherosclerosis (ICD10-I70.0) and Emphysema (ICD10-J43.9).   ASSESSMENT &  PLAN:  Bobby Caldwell is a 62 y.o. [place] male with a  history of heavy smoking and COPD, peripheral vascular disease, presents with fatigue, anorexia, early satiety and weight loss   1. Gastric Adenocarcinoma, uT2N0M0, stage I -I reviewed his EGD findings and biopsy pathology results that show gastric adenocarcinoma. He had CT chest, abdomen and pelvis with contrast for his peripheral vascular disease on 04/02/2017, which shows no sign of metastasis at that time.  -I will repeat staging scan to rule out new distant metastasis since last scan.  -His EUS showed a T2N0 lesion in distal gastric body -Due to his intensive peripheral vascular disease and recent bypass surgery, poor nutrition status, Dr. [name] not feel he is a great surgical candidate at this point, she recommended him to improve his nutrition status and to consider neoadjuvant chemotherapy first.  Giving the T2 lesion, neoadjuvant chemotherapy is reasonable and I agree with Dr. [name]. -The periop chemo FLOT4 (5-fu, oxaliplatin, and docetaxel) has showed superior outcome for gastric cancer. However, due to his poor nutrition status, comorbidities, and poor performance status, he may not be able to tolerate such intensive chemotherapy.  I will consider FOLFOX as the first cycle of neoadjuvant chemo, his nutrition status improved and he tolerates well, will consider change to FLOT4 -His cardiac workup with Dr. Ellyn Hack has been completed.  He has low possibility of cardiac ischemia and he is cleared to proceed with chemotherapy -We reviewed his CT CAP from 06/22/17 showed no other metastatic disease.  -He has started neoadjuvant FOLFOX every 2 weeks on 07/07/17. He tolerated moderately well, with some fatigue, nausea, dizziness (probable related to medication) -He has had 2 cycle of neoadjuvant chemotherapy, tolerated moderately well overall, his weight is stable, overall his PS and symptoms not much different from his baseline, will proceed with cycle 3 today.  I will increase 5-FU dose to full  dose today and keep oxaliplatin at '70mg'$ /m2 due to his tolerance issue -I will discuss with Dr. [name] to see if she will take her to surgery after 6-8 cycle chemo  -f/u in 2 weeks, plan to repeat scan before surgery   2. Anorexia, Insomnia, nausea and weight loss  -He has loss 14 pounds over past 6 months  -f/u with dietician  -He currently takes ensure boosts daily.  -He has been gaining weight recently. I encouraged him to continue with supplement to gain more weight.  -I reviewed his antiemetic regimen, and encouraged him to take medication 30 minutes before each meal as needed.  -I prescribed mirtazapine today to help with his appetite.  -His appetite is much improved from mirtazapine -He can increase to take mirtazapine twice at night to help with sleep.  3. COPD, Smoking Cessation  -He reduced from 50 cigars a day to 10 cigars a day. He is actively trying to quit. He has mild chronic cough  -I previously discussed his chest pain is due to his cough from COPD and smoking.   4. Financial support -I will refer him to our social worker for help with financial help, he is no longer able to work and would like apply for disability.   -His wife has not returned to work to help him at home. I recommend she apply for FMLA at her place of work.  -He will need assistance with transportation.  -He was seen by our SW   5. Personality change, Depression and or anxiety -Patient's daughter called me after his visit, she is concerned about his  personality change lately, he has been agitated, gets upset quickly, and not sleeping well.  Daughter is concerned about depression and anxiety.  I think this is possible related to steroids, and or his side effect from chemo.   -I encouraged daughter to watch him at home, and call us if she has concerns. -Patient denied depression, he was seen by our social worker, and he declined counseling. -Has Ativan as needed for insomnia.  PLAN:  -Lab reviewed,  adequate for treatment, will proceed to cycle 3 today, with 5-fu full dose  -Refilled albuterol sulfate today -f/u in 2 weeks   No orders of the defined types were placed in this encounter.   All questions were answered. The patient knows to call the clinic with any problems, questions or concerns. I spent 20 minutes counseling the patient face to face. The total time spent in the appointment was 25 minutes and more than 50% was on counseling.     [name], MD 08/06/2017 8:04 PM   This document serves as a record of services personally performed by [name], MD. It was created on her behalf by Theresia Bough, a trained medical scribe. The creation of this record is based on the scribe's personal observations and the provider's statements to them.   I have reviewed the above documentation for accuracy and completeness, and I agree with the above.

## 2017-08-06 ENCOUNTER — Inpatient Hospital Stay (HOSPITAL_BASED_OUTPATIENT_CLINIC_OR_DEPARTMENT_OTHER): Payer: BLUE CROSS/BLUE SHIELD | Admitting: Hematology

## 2017-08-06 ENCOUNTER — Telehealth: Payer: Self-pay | Admitting: Hematology

## 2017-08-06 ENCOUNTER — Inpatient Hospital Stay: Payer: BLUE CROSS/BLUE SHIELD

## 2017-08-06 ENCOUNTER — Encounter: Payer: Self-pay | Admitting: Hematology

## 2017-08-06 VITALS — BP 159/81 | HR 80 | Temp 98.3°F | Resp 20 | Ht 66.0 in | Wt 117.7 lb

## 2017-08-06 DIAGNOSIS — I739 Peripheral vascular disease, unspecified: Secondary | ICD-10-CM | POA: Diagnosis not present

## 2017-08-06 DIAGNOSIS — G4700 Insomnia, unspecified: Secondary | ICD-10-CM

## 2017-08-06 DIAGNOSIS — C160 Malignant neoplasm of cardia: Secondary | ICD-10-CM

## 2017-08-06 DIAGNOSIS — Z95828 Presence of other vascular implants and grafts: Secondary | ICD-10-CM

## 2017-08-06 DIAGNOSIS — C169 Malignant neoplasm of stomach, unspecified: Secondary | ICD-10-CM | POA: Diagnosis not present

## 2017-08-06 DIAGNOSIS — R05 Cough: Secondary | ICD-10-CM

## 2017-08-06 DIAGNOSIS — C162 Malignant neoplasm of body of stomach: Secondary | ICD-10-CM

## 2017-08-06 DIAGNOSIS — J431 Panlobular emphysema: Secondary | ICD-10-CM

## 2017-08-06 DIAGNOSIS — R110 Nausea: Secondary | ICD-10-CM

## 2017-08-06 DIAGNOSIS — R630 Anorexia: Secondary | ICD-10-CM | POA: Diagnosis not present

## 2017-08-06 DIAGNOSIS — I10 Essential (primary) hypertension: Secondary | ICD-10-CM

## 2017-08-06 DIAGNOSIS — F17290 Nicotine dependence, other tobacco product, uncomplicated: Secondary | ICD-10-CM | POA: Diagnosis not present

## 2017-08-06 DIAGNOSIS — J449 Chronic obstructive pulmonary disease, unspecified: Secondary | ICD-10-CM

## 2017-08-06 DIAGNOSIS — R451 Restlessness and agitation: Secondary | ICD-10-CM | POA: Diagnosis not present

## 2017-08-06 DIAGNOSIS — Z5111 Encounter for antineoplastic chemotherapy: Secondary | ICD-10-CM | POA: Diagnosis not present

## 2017-08-06 LAB — COMPREHENSIVE METABOLIC PANEL
ALBUMIN: 3.3 g/dL — AB (ref 3.5–5.0)
ALT: 30 U/L (ref 0–55)
ANION GAP: 6 (ref 3–11)
AST: 16 U/L (ref 5–34)
Alkaline Phosphatase: 117 U/L (ref 40–150)
BUN: 7 mg/dL (ref 7–26)
CO2: 31 mmol/L — AB (ref 22–29)
Calcium: 8.9 mg/dL (ref 8.4–10.4)
Chloride: 102 mmol/L (ref 98–109)
Creatinine, Ser: 0.61 mg/dL — ABNORMAL LOW (ref 0.70–1.30)
GFR calc non Af Amer: >60 mL/min (ref >60)
GLUCOSE: 78 mg/dL (ref 70–140)
Potassium: 4.2 mmol/L (ref 3.5–5.1)
SODIUM: 139 mmol/L (ref 136–145)
Total Bilirubin: 0.4 mg/dL (ref 0.2–1.2)
Total Protein: 6.7 g/dL (ref 6.4–8.3)

## 2017-08-06 LAB — CBC WITH DIFFERENTIAL/PLATELET
BASOS ABS: 0 10*3/uL (ref 0.0–0.1)
BASOS PCT: 0 %
Eosinophils Absolute: 0.3 10*3/uL (ref 0.0–0.5)
Eosinophils Relative: 3 %
HCT: 44.8 % (ref 38.4–49.9)
HEMOGLOBIN: 15.2 g/dL (ref 13.0–17.1)
LYMPHS PCT: 25 %
Lymphs Abs: 2.3 10*3/uL (ref 0.9–3.3)
MCH: 29.3 pg (ref 27.2–33.4)
MCHC: 33.9 g/dL (ref 32.0–36.0)
MCV: 86.5 fL (ref 79.3–98.0)
MONO ABS: 0.6 10*3/uL (ref 0.1–0.9)
MONOS PCT: 7 %
NEUTROS ABS: 5.8 10*3/uL (ref 1.5–6.5)
NEUTROS PCT: 65 %
Platelets: 141 10*3/uL (ref 140–400)
RBC: 5.18 MIL/uL (ref 4.20–5.82)
RDW: 13.9 % (ref 11.0–15.6)
WBC: 9 10*3/uL (ref 4.0–10.3)

## 2017-08-06 MED ORDER — PALONOSETRON HCL INJECTION 0.25 MG/5ML
INTRAVENOUS | Status: AC
  Filled 2017-08-06: qty 5

## 2017-08-06 MED ORDER — DEXAMETHASONE SODIUM PHOSPHATE 10 MG/ML IJ SOLN
INTRAMUSCULAR | Status: AC
  Filled 2017-08-06: qty 1

## 2017-08-06 MED ORDER — LEUCOVORIN CALCIUM INJECTION 350 MG
400.0000 mg/m2 | Freq: Once | INTRAVENOUS | Status: AC
  Administered 2017-08-06: 616 mg via INTRAVENOUS
  Filled 2017-08-06: qty 25

## 2017-08-06 MED ORDER — OXALIPLATIN CHEMO INJECTION 100 MG/20ML
70.0000 mg/m2 | Freq: Once | INTRAVENOUS | Status: AC
  Administered 2017-08-06: 110 mg via INTRAVENOUS
  Filled 2017-08-06: qty 2

## 2017-08-06 MED ORDER — SODIUM CHLORIDE 0.9 % IV SOLN
2400.0000 mg/m2 | INTRAVENOUS | Status: DC
  Administered 2017-08-06: 3700 mg via INTRAVENOUS
  Filled 2017-08-06: qty 74

## 2017-08-06 MED ORDER — SODIUM CHLORIDE 0.9% FLUSH
10.0000 mL | Freq: Once | INTRAVENOUS | Status: AC
  Administered 2017-08-06: 10 mL
  Filled 2017-08-06: qty 10

## 2017-08-06 MED ORDER — DEXTROSE 5 % IV SOLN
Freq: Once | INTRAVENOUS | Status: AC
  Administered 2017-08-06: 13:00:00 via INTRAVENOUS

## 2017-08-06 MED ORDER — PALONOSETRON HCL INJECTION 0.25 MG/5ML
0.2500 mg | Freq: Once | INTRAVENOUS | Status: AC
  Administered 2017-08-06: 0.25 mg via INTRAVENOUS

## 2017-08-06 MED ORDER — ALBUTEROL SULFATE 108 (90 BASE) MCG/ACT IN AEPB: 2.0000 | INHALATION_SPRAY | Freq: Four times a day (QID) | RESPIRATORY_TRACT | 6 refills | Status: DC | PRN

## 2017-08-06 MED ORDER — DEXAMETHASONE SODIUM PHOSPHATE 10 MG/ML IJ SOLN
10.0000 mg | Freq: Once | INTRAMUSCULAR | Status: AC
  Administered 2017-08-06: 10 mg via INTRAVENOUS

## 2017-08-06 NOTE — Patient Instructions (Signed)
[facility] Discharge Instructions for Patients Receiving Chemotherapy  Today you received the following chemotherapy agents Oxaliplatin, Leucovorin and Adrucil   To help prevent nausea and vomiting after your treatment, we encourage you to take your nausea medication as directed.    If you develop nausea and vomiting that is not controlled by your nausea medication, call the clinic.   BELOW ARE SYMPTOMS THAT SHOULD BE REPORTED IMMEDIATELY:  *FEVER GREATER THAN 100.5 F  *CHILLS WITH OR WITHOUT FEVER  NAUSEA AND VOMITING THAT IS NOT CONTROLLED WITH YOUR NAUSEA MEDICATION  *UNUSUAL SHORTNESS OF BREATH  *UNUSUAL BRUISING OR BLEEDING  TENDERNESS IN MOUTH AND THROAT WITH OR WITHOUT PRESENCE OF ULCERS  *URINARY PROBLEMS  *BOWEL PROBLEMS  UNUSUAL RASH Items with * indicate a potential emergency and should be followed up as soon as possible.  Feel free to call the clinic should you have any questions or concerns. The clinic phone number is [phone].  Please show the CHEMO ALERT CARD at check-in to the Emergency Department and triage nurse.   

## 2017-08-06 NOTE — Telephone Encounter (Signed)
Gave avs and calendar for [employer]

## 2017-08-08 ENCOUNTER — Inpatient Hospital Stay: Payer: BLUE CROSS/BLUE SHIELD

## 2017-08-08 VITALS — BP 128/72 | HR 82 | Temp 98.3°F | Resp 16

## 2017-08-08 DIAGNOSIS — C162 Malignant neoplasm of body of stomach: Secondary | ICD-10-CM

## 2017-08-08 MED ORDER — SODIUM CHLORIDE 0.9% FLUSH
10.0000 mL | INTRAVENOUS | Status: DC | PRN
  Filled 2017-08-08: qty 10

## 2017-08-08 MED ORDER — HEPARIN SOD (PORK) LOCK FLUSH 100 UNIT/ML IV SOLN
500.0000 [IU] | Freq: Once | INTRAVENOUS | Status: DC | PRN
  Filled 2017-08-08: qty 5

## 2017-08-09 ENCOUNTER — Telehealth: Payer: Self-pay | Admitting: *Deleted

## 2017-08-09 NOTE — Telephone Encounter (Signed)
Daughter Imrane called and left message wanting to talk to Dr. Burr Medico about her father.   Called pt back, and was informed that pt was not available per her daughter.  Left message asking Imrane to call nurse back. Imrane's    Phone    [phone].

## 2017-08-20 ENCOUNTER — Telehealth: Payer: Self-pay

## 2017-08-20 ENCOUNTER — Inpatient Hospital Stay (HOSPITAL_BASED_OUTPATIENT_CLINIC_OR_DEPARTMENT_OTHER): Payer: BLUE CROSS/BLUE SHIELD | Admitting: Nurse Practitioner

## 2017-08-20 ENCOUNTER — Inpatient Hospital Stay: Payer: BLUE CROSS/BLUE SHIELD

## 2017-08-20 ENCOUNTER — Inpatient Hospital Stay: Payer: BLUE CROSS/BLUE SHIELD | Attending: Hematology

## 2017-08-20 VITALS — BP 141/89 | HR 80 | Temp 98.4°F | Resp 20 | Wt 117.5 lb

## 2017-08-20 DIAGNOSIS — Z95828 Presence of other vascular implants and grafts: Secondary | ICD-10-CM

## 2017-08-20 DIAGNOSIS — M2548 Effusion, other site: Secondary | ICD-10-CM | POA: Insufficient documentation

## 2017-08-20 DIAGNOSIS — R21 Rash and other nonspecific skin eruption: Secondary | ICD-10-CM | POA: Diagnosis not present

## 2017-08-20 DIAGNOSIS — G893 Neoplasm related pain (acute) (chronic): Secondary | ICD-10-CM | POA: Insufficient documentation

## 2017-08-20 DIAGNOSIS — J45901 Unspecified asthma with (acute) exacerbation: Secondary | ICD-10-CM | POA: Insufficient documentation

## 2017-08-20 DIAGNOSIS — C162 Malignant neoplasm of body of stomach: Secondary | ICD-10-CM | POA: Diagnosis not present

## 2017-08-20 DIAGNOSIS — J441 Chronic obstructive pulmonary disease with (acute) exacerbation: Secondary | ICD-10-CM | POA: Insufficient documentation

## 2017-08-20 DIAGNOSIS — R112 Nausea with vomiting, unspecified: Secondary | ICD-10-CM

## 2017-08-20 DIAGNOSIS — F17210 Nicotine dependence, cigarettes, uncomplicated: Secondary | ICD-10-CM

## 2017-08-20 DIAGNOSIS — R634 Abnormal weight loss: Secondary | ICD-10-CM | POA: Insufficient documentation

## 2017-08-20 DIAGNOSIS — R062 Wheezing: Secondary | ICD-10-CM | POA: Diagnosis not present

## 2017-08-20 DIAGNOSIS — Z5111 Encounter for antineoplastic chemotherapy: Secondary | ICD-10-CM | POA: Diagnosis not present

## 2017-08-20 DIAGNOSIS — C160 Malignant neoplasm of cardia: Secondary | ICD-10-CM

## 2017-08-20 DIAGNOSIS — C169 Malignant neoplasm of stomach, unspecified: Secondary | ICD-10-CM | POA: Diagnosis present

## 2017-08-20 LAB — CBC WITH DIFFERENTIAL/PLATELET
Basophils Absolute: 0 10*3/uL (ref 0.0–0.1)
Basophils Relative: 1 %
EOS ABS: 0.1 10*3/uL (ref 0.0–0.5)
Eosinophils Relative: 2 %
HCT: 42.5 % (ref 38.4–49.9)
Hemoglobin: 14.2 g/dL (ref 13.0–17.1)
LYMPHS ABS: 1.2 10*3/uL (ref 0.9–3.3)
LYMPHS PCT: 18 %
MCH: 29.3 pg (ref 27.2–33.4)
MCHC: 33.4 g/dL (ref 32.0–36.0)
MCV: 87.6 fL (ref 79.3–98.0)
MONOS PCT: 25 %
Monocytes Absolute: 1.6 10*3/uL — ABNORMAL HIGH (ref 0.1–0.9)
Neutro Abs: 3.6 10*3/uL (ref 1.5–6.5)
Neutrophils Relative %: 54 %
Platelets: 177 10*3/uL (ref 140–400)
RBC: 4.85 MIL/uL (ref 4.20–5.82)
RDW: 14.2 % (ref 11.0–14.6)
WBC: 6.5 10*3/uL (ref 4.0–10.3)

## 2017-08-20 LAB — COMPREHENSIVE METABOLIC PANEL
ALBUMIN: 2.9 g/dL — AB (ref 3.5–5.0)
ALT: 31 U/L (ref 0–55)
AST: 27 U/L (ref 5–34)
Alkaline Phosphatase: 115 U/L (ref 40–150)
Anion gap: 8 (ref 3–11)
BUN: 9 mg/dL (ref 7–26)
CHLORIDE: 103 mmol/L (ref 98–109)
CO2: 29 mmol/L (ref 22–29)
CREATININE: 0.71 mg/dL (ref 0.70–1.30)
Calcium: 8.8 mg/dL (ref 8.4–10.4)
GFR calc Af Amer: >60 mL/min (ref >60)
GFR calc non Af Amer: >60 mL/min (ref >60)
GLUCOSE: 111 mg/dL (ref 70–140)
POTASSIUM: 3.7 mmol/L (ref 3.5–5.1)
SODIUM: 140 mmol/L (ref 136–145)
Total Bilirubin: 0.5 mg/dL (ref 0.2–1.2)
Total Protein: 6.7 g/dL (ref 6.4–8.3)

## 2017-08-20 MED ORDER — SODIUM CHLORIDE 0.9 % IV SOLN
INTRAVENOUS | Status: DC
  Administered 2017-08-20: 10:00:00 via INTRAVENOUS

## 2017-08-20 MED ORDER — ALBUTEROL SULFATE (2.5 MG/3ML) 0.083% IN NEBU
2.5000 mg | INHALATION_SOLUTION | Freq: Once | RESPIRATORY_TRACT | Status: AC
  Administered 2017-08-20: 2.5 mg via RESPIRATORY_TRACT
  Filled 2017-08-20: qty 3

## 2017-08-20 MED ORDER — ALBUTEROL SULFATE (2.5 MG/3ML) 0.083% IN NEBU
INHALATION_SOLUTION | RESPIRATORY_TRACT | Status: AC
  Filled 2017-08-20: qty 3

## 2017-08-20 MED ORDER — SODIUM CHLORIDE 0.9% FLUSH
10.0000 mL | INTRAVENOUS | Status: DC | PRN
  Administered 2017-08-20: 10 mL
  Filled 2017-08-20: qty 10

## 2017-08-20 MED ORDER — METHYLPREDNISOLONE 4 MG PO TBPK: ORAL_TABLET | ORAL | 0 refills | Status: DC

## 2017-08-20 MED ORDER — TRAMADOL HCL 50 MG PO TABS: 50.0000 mg | ORAL_TABLET | Freq: Four times a day (QID) | ORAL | 0 refills | Status: DC | PRN

## 2017-08-20 MED ORDER — AZITHROMYCIN 250 MG PO TABS: ORAL_TABLET | ORAL | 0 refills | Status: DC

## 2017-08-20 MED ORDER — HEPARIN SOD (PORK) LOCK FLUSH 100 UNIT/ML IV SOLN
500.0000 [IU] | Freq: Once | INTRAVENOUS | Status: AC | PRN
  Administered 2017-08-20: 500 [IU]
  Filled 2017-08-20: qty 5

## 2017-08-20 NOTE — Patient Instructions (Signed)
Dehydration, Adult Dehydration is a condition in which there is not enough fluid or water in the body. This happens when you lose more fluids than you take in. Important organs, such as the kidneys, brain, and heart, cannot function without a proper amount of fluids. Any loss of fluids from the body can lead to dehydration. Dehydration can range from mild to severe. This condition should be treated right away to prevent it from becoming severe. What are the causes? This condition may be caused by:  Vomiting.  Diarrhea.  Excessive sweating, such as from heat exposure or exercise.  Not drinking enough fluid, especially: ? When ill. ? While doing activity that requires a lot of energy.  Excessive urination.  Fever.  Infection.  Certain medicines, such as medicines that cause the body to lose excess fluid (diuretics).  Inability to access safe drinking water.  Reduced physical ability to get adequate water and food.  What increases the risk? This condition is more likely to develop in people:  Who have a poorly controlled long-term (chronic) illness, such as diabetes, heart disease, or kidney disease.  Who are age 65 or older.  Who are disabled.  Who live in a place with high altitude.  Who play endurance sports.  What are the signs or symptoms? Symptoms of mild dehydration may include:  Thirst.  Dry lips.  Slightly dry mouth.  Dry, warm skin.  Dizziness. Symptoms of moderate dehydration may include:  Very dry mouth.  Muscle cramps.  Dark urine. Urine may be the color of tea.  Decreased urine production.  Decreased tear production.  Heartbeat that is irregular or faster than normal (palpitations).  Headache.  Light-headedness, especially when you stand up from a sitting position.  Fainting (syncope). Symptoms of severe dehydration may include:  Changes in skin, such as: ? Cold and clammy skin. ? Blotchy (mottled) or pale skin. ? Skin that does  not quickly return to normal after being lightly pinched and released (poor skin turgor).  Changes in body fluids, such as: ? Extreme thirst. ? No tear production. ? Inability to sweat when body temperature is high, such as in hot weather. ? Very little urine production.  Changes in vital signs, such as: ? Weak pulse. ? Pulse that is more than 100 beats a minute when sitting still. ? Rapid breathing. ? Low blood pressure.  Other changes, such as: ? Sunken eyes. ? Cold hands and feet. ? Confusion. ? Lack of energy (lethargy). ? Difficulty waking up from sleep. ? Short-term weight loss. ? Unconsciousness. How is this diagnosed? This condition is diagnosed based on your symptoms and a physical exam. Blood and urine tests may be done to help confirm the diagnosis. How is this treated? Treatment for this condition depends on the severity. Mild or moderate dehydration can often be treated at home. Treatment should be started right away. Do not wait until dehydration becomes severe. Severe dehydration is an emergency and it needs to be treated in a hospital. Treatment for mild dehydration may include:  Drinking more fluids.  Replacing salts and minerals in your blood (electrolytes) that you may have lost. Treatment for moderate dehydration may include:  Drinking an oral rehydration solution (ORS). This is a drink that helps you replace fluids and electrolytes (rehydrate). It can be found at pharmacies and retail stores. Treatment for severe dehydration may include:  Receiving fluids through an IV tube.  Receiving an electrolyte solution through a feeding tube that is passed through your nose   and into your stomach (nasogastric tube, or NG tube).  Correcting any abnormalities in electrolytes.  Treating the underlying cause of dehydration. Follow these instructions at home:  If directed by your health care provider, drink an ORS: ? Make an ORS by following instructions on the  package. ? Start by drinking small amounts, about  cup (120 mL) every 5-10 minutes. ? Slowly increase how much you drink until you have taken the amount recommended by your health care provider.  Drink enough clear fluid to keep your urine clear or pale yellow. If you were told to drink an ORS, finish the ORS first, then start slowly drinking other clear fluids. Drink fluids such as: ? Water. Do not drink only water. Doing that can lead to having too little salt (sodium) in the body (hyponatremia). ? Ice chips. ? Fruit juice that you have added water to (diluted fruit juice). ? Low-calorie sports drinks.  Avoid: ? Alcohol. ? Drinks that contain a lot of sugar. These include high-calorie sports drinks, fruit juice that is not diluted, and soda. ? Caffeine. ? Foods that are greasy or contain a lot of fat or sugar.  Take over-the-counter and prescription medicines only as told by your health care provider.  Do not take sodium tablets. This can lead to having too much sodium in the body (hypernatremia).  Eat foods that contain a healthy balance of electrolytes, such as bananas, oranges, potatoes, tomatoes, and spinach.  Keep all follow-up visits as told by your health care provider. This is important. Contact a health care provider if:  You have abdominal pain that: ? Gets worse. ? Stays in one area (localizes).  You have a rash.  You have a stiff neck.  You are more irritable than usual.  You are sleepier or more difficult to wake up than usual.  You feel weak or dizzy.  You feel very thirsty.  You have urinated only a small amount of very dark urine over 6-8 hours. Get help right away if:  You have symptoms of severe dehydration.  You cannot drink fluids without vomiting.  Your symptoms get worse with treatment.  You have a fever.  You have a severe headache.  You have vomiting or diarrhea that: ? Gets worse. ? Does not go away.  You have blood or green matter  (bile) in your vomit.  You have blood in your stool. This may cause stool to look black and tarry.  You have not urinated in 6-8 hours.  You faint.  Your heart rate while sitting still is over 100 beats a minute.  You have trouble breathing. This information is not intended to replace advice given to you by your health care provider. Make sure you discuss any questions you have with your health care provider. Document Released: 06/26/2005 Document Revised: 01/21/2016 Document Reviewed: 08/20/2015 Elsevier Interactive Patient Education  2018 [employer]  

## 2017-08-20 NOTE — Progress Notes (Addendum)
Bobby Caldwell  [phone] [phone]  Clinic Follow up Note   Patient Care Team: Minette Brine as PCP - General (General Practice) [name], MD as Consulting Physician (Pulmonary Disease) [name], MD as Consulting Physician (Hematology) [name], MD as Attending Physician (Gastroenterology) [name], MD as Consulting Physician (General Surgery) 08/20/2017  CHIEF COMPLAINT: F/u gastric adenocarcinoma  SUMMARY OF ONCOLOGIC HISTORY: Oncology History   Cancer Staging Gastric cancer [facility]) Staging form: Stomach, AJCC 8th Edition - Clinical stage from 05/24/2017: Stage I (cT2, cN0, cM0) - Signed by [name], MD on 06/19/2017       Gastric cancer ([place])   05/24/2017 Procedure    EGD by Dr. [name] 05/24/17 IMPRESSION Ulcerated mass like region in the distal stomach (4cm across), extensively biopsied. This appears neoplastic. - Abnormal mucosa throughout stomach otherwise (mild-moderate gastritis), also extensively biopsied.      05/24/2017 Initial Biopsy    Diagnosis 05/24/17  1. Stomach, biopsy, mass distal - ADENOCARCINOMA, SEE COMMENT. - CHRONIC ACTIVE GASTRITIS WITH INTESTINAL METAPLASIA. 2. 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 - NO DYSPLASIA OR MALIGNANCY      06/04/2017 Initial Diagnosis    Gastric cancer ([place])      06/07/2017 Procedure    EUS by Dr. [name] on 06/07/17  IMPRESSION:  5cm across, partially circumferential uT2N0 gastric adenocarcinoma laying in the distal stomach 3-4cm proximal to the pylorus.      06/22/2017 Imaging    CT CAP W Contrast 06/22/17  IMPRESSION: 1. The gastric mass is somewhat inconspicuous on CT, but a potential 1.2 cm thick mucosal masses seen along the posterior gastric border on image 83/2. There several small adjacent right gastric lymph nodes measuring up to 0.7 cm in short axis, but  no overtly pathologic adenopathy and no findings of hepatic or peritoneal metastatic disease at this time. 2. Calcified granulomas in both lungs along with several noncalcified nodules stable from August 2016 and likely benign given this long-term stability. 3. Other imaging findings of potential clinical significance: Aortic Atherosclerosis (ICD10-I70.0) and Emphysema (ICD10-J43.9). Airway thickening is present, suggesting bronchitis or reactive airways disease. 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 Disc bulges at L3-4 and L4-5.      07/05/2017 Surgery    PAC placement       07/07/2017 -  Chemotherapy    Neoadjuvant FOLFOX with with leucovorin every 2 weeks for 6 cycles starting 07/07/17. Will start with reduced dose for cycle 1.      CURRENT THERAPY: neoadjuvant FOLFOX with leucovorin every 2 weeks for 6-8 cycles starting 07/07/17   INTERVAL HISTORY: Bobby Caldwell presents in the infusion room. He is here with his family. He was last treated with FOLFOX cycle 3 on 08/06/17.Since then he reports progressive fatigue and weakness. He cannot walk from his house to his care without stopping for a break. He notes worsening cough with increased sputum production and dyspnea since last f/u 2 weeks ago. Reportedly uses symbicort as prescribed but does not have albuterol inhaler at home. Has not seen pulmonologist Dr. Lenna Gilford recently. Alternates between hot and cold but denies documented fever. He is eating and drinking fair amount, denies much improvement in his appetite or sleep on remeron. Still wakes up every 2 hours or so. Has intermittent nausea with dry heaves every night. Zofran and compazine improve symptoms when he takes medication. No constipation  or diarrhea. Intermittent epigastric pain is managed with medication; asking for refill today. Has cold sensitivity with numbness and stiffness to hands and feet 3-4 days  after chemo then resolves.   REVIEW OF SYSTEMS:   Constitutional: Denies fevers, chills or abnormal weight loss (+) severe fatigue  Eyes: Denies blurriness of vision Ears, nose, mouth, throat, and face: Denies mucositis or sore throat Respiratory: (+) productive cough, increased from baseline (+) increasing dyspnea (+) wheezes Cardiovascular: Denies palpitation, chest discomfort or lower extremity swelling Gastrointestinal:  Denies constipation, diarrhea, heartburn or change in bowel habits (+) intermittent nausea (+) dry heaves at night   Skin: Denies abnormal skin rashes Lymphatics: Denies new lymphadenopathy or easy bruising Neurological:Denies numbness, tingling (+) weakness (+) cold sensitivity with numbness and stiffness to hands and feet  Behavioral/Psych: Mood is stable, no new changes (+) angry (+) irritable  All other systems were reviewed with the patient and are negative.  MEDICAL HISTORY:  Past Medical History:  Diagnosis Date  . COPD (chronic obstructive pulmonary disease) ([place])   . Dyspnea   . PAD (peripheral artery disease) ([place])   . Stomach cancer ([place])   . Tobacco abuse   . Weight loss     SURGICAL HISTORY: Past Surgical History:  Procedure Laterality Date  . ABDOMINAL AORTOGRAM W/LOWER EXTREMITY N/A 05/03/2017   Procedure: ABDOMINAL AORTOGRAM W/LOWER EXTREMITY;  Surgeon: Conrad [place], MD;  Location: [place] [facility];  Service: Cardiovascular;  Laterality: N/A;  . ESOPHAGOGASTRODUODENOSCOPY     05/24/17  . ESOPHAGOGASTRODUODENOSCOPY (EGD) WITH PROPOFOL N/A 05/24/2017   Procedure: ESOPHAGOGASTRODUODENOSCOPY (EGD) WITH PROPOFOL;  Surgeon: [name], MD;  Location: [facility];  Service: Endoscopy;  Laterality: N/A;  . EUS N/A 06/07/2017   Procedure: UPPER ENDOSCOPIC ULTRASOUND (EUS) RADIAL;  Surgeon: [name], MD;  Location: [facility];  Service: Endoscopy;  Laterality: N/A;  . FEMORAL-FEMORAL BYPASS GRAFT Bilateral 05/08/2017   Procedure:  LEFT TO RIGHT BYPASS GRAFT FEMORAL-FEMORAL ARTERY;  Surgeon: Conrad Stateburg, MD;  Location: [place];  Service: Vascular;  Laterality: Bilateral;  . IR FLUORO GUIDE PORT INSERTION RIGHT  07/05/2017  . IR US GUIDE VASC ACCESS RIGHT  07/05/2017  . NM MYOVIEW LTD  06/2017   LOW RISK. Small basal inferoseptal defect thought to be related to artifact (although cannot exclude prior infarct). Normal wall motion i in this area would suggest artifact not infarct. Otherwise no ischemia or infarction.  Marland Kitchen PERIPHERAL VASCULAR INTERVENTION Left 05/03/2017   Procedure: PERIPHERAL VASCULAR INTERVENTION;  Surgeon: Conrad [place], MD;  Location: [place] [facility];  Service: Cardiovascular;  Laterality: Left;  common iliac  . TRANSTHORACIC ECHOCARDIOGRAM  06/26/2017   Normal LV size and function. EF 60-65%. No wall motion abnormalities. GR 1 DD. Mild LA dilation    I have reviewed the social history and family history with the patient and they are unchanged from previous note.  ALLERGIES:  has No Known Allergies.  MEDICATIONS:  Current Outpatient Medications  Medication Sig Dispense Refill  . budesonide-formoterol (SYMBICORT) 160-4.5 MCG/ACT inhaler Inhale 2 puffs into the lungs 2 (two) times daily. 1 Inhaler 6  . LORazepam (ATIVAN) 0.5 MG tablet Take 1 tablet (0.5 mg total) by mouth every 6 (six) hours as needed (Nausea or vomiting). 30 tablet 0  . mirtazapine (REMERON) 7.5 MG tablet Take 1 tablet (7.5 mg total) by mouth at bedtime. 30 tablet 1  . ondansetron (ZOFRAN) 8 MG tablet Take 1 tablet (8 mg total) by mouth 2 (two)  times daily as needed for refractory nausea / vomiting. Start on day 3 after chemotherapy. 30 tablet 1  . prochlorperazine (COMPAZINE) 10 MG tablet Take 1 tablet (10 mg total) by mouth every 6 (six) hours as needed (Nausea or vomiting). 30 tablet 1  . traMADol (ULTRAM) 50 MG tablet Take 1 tablet (50 mg total) by mouth every 6 (six) hours as needed. 30 tablet 0  . Albuterol Sulfate (PROAIR  RESPICLICK) 476 (90 Base) MCG/ACT AEPB Inhale 2 puffs into the lungs every 6 (six) hours as needed. 1 each 6  . atorvastatin (LIPITOR) 10 MG tablet Take 1 tablet (10 mg total) by mouth daily. 30 tablet 2  . azithromycin (ZITHROMAX Z-PAK) 250 MG tablet Take as directed 6 each 0  . clopidogrel (PLAVIX) 75 MG tablet Take 1 tablet (75 mg total) by mouth daily. 30 tablet 11  . dexamethasone (DECADRON) 4 MG tablet Take 2 tablets (8 mg total) by mouth daily. Start the day after chemotherapy for 2 days. Take with food. 30 tablet 1  . lidocaine-prilocaine (EMLA) cream Apply to affected area once 30 g 3  . loperamide (IMODIUM A-D) 2 MG tablet Take 2 mg by mouth 4 (four) times daily as needed for diarrhea or loose stools.    . methylPREDNISolone (MEDROL DOSEPAK) 4 MG TBPK tablet Take as directed 21 tablet 0  . oxyCODONE-acetaminophen (ROXICET) 5-325 MG tablet Take 1 tablet by mouth every 6 (six) hours as needed for severe pain. 20 tablet 0  . Tiotropium Bromide Monohydrate (SPIRIVA RESPIMAT) 2.5 MCG/ACT AERS Inhale 2 puffs into the lungs daily. (Patient taking differently: Inhale 1 puff into the lungs daily. ) 1 Inhaler 6   Current Facility-Administered Medications  Medication Dose Route Frequency Provider Last Rate Last Dose  . 0.9 %  sodium chloride infusion   Intravenous Continuous Alla Feeling, NP   Stopped at 08/20/17 1112    PHYSICAL EXAMINATION: ECOG PERFORMANCE STATUS: 3 - Symptomatic, >50% confined to bed BP 141/89 P 115 RR 20 T 98.4 O2 95% Wt 117 Ht 5'6" BSA 1.58  GENERAL:alert, no acute distress  SKIN: skin color, texture, turgor are normal, no rashes or significant lesions EYES: normal, Conjunctiva are pink and non-injected, sclera clear OROPHARYNX:no exudate, no erythema and lips, buccal mucosa, and tongue normal  NECK: supple, thyroid normal size, non-tender, without nodularity LYMPH:  no palpable cervical lymphadenopathy LUNGS: diffuse wheezing bilaterally with normal breathing  effort HEART: regular rate & rhythm and no murmurs and no lower extremity edema ABDOMEN:abdomen soft, non-tender and normal bowel sounds (+) mild epigastric tenderness to palpation  Musculoskeletal:no cyanosis of digits and no clubbing  NEURO: alert & oriented x 3 with fluent speech, no focal motor/sensory deficits PAC without erythema   LABORATORY DATA:  I have reviewed the data as listed CBC Latest Ref Rng & Units 08/20/2017 08/06/2017 07/24/2017  WBC 4.0 - 10.3 K/uL 6.5 9.0 17.6(H)  Hemoglobin 13.0 - 17.1 g/dL 14.2 15.2 15.8  Hematocrit 38.4 - 49.9 % 42.5 44.8 48.0  Platelets 140 - 400 K/uL 177 141 264     CMP Latest Ref Rng & Units 08/20/2017 08/06/2017 07/24/2017  Glucose 70 - 140 mg/dL 111 78 78  BUN 7 - 26 mg/dL 9 7 22   Creatinine 0.70 - 1.30 mg/dL 0.71 0.61(L) 0.64(L)  Sodium 136 - 145 mmol/L 140 139 139  Potassium 3.5 - 5.1 mmol/L 3.7 4.2 3.8  Chloride 98 - 109 mmol/L 103 102 101  CO2 22 - 29 mmol/L 29 31(H)  29  Calcium 8.4 - 10.4 mg/dL 8.8 8.9 8.5  Total Protein 6.4 - 8.3 g/dL 6.7 6.7 6.4  Total Bilirubin 0.2 - 1.2 mg/dL 0.5 0.4 0.3  Alkaline Phos 40 - 150 U/L 115 117 85  AST 5 - 34 U/L 27 16 20   ALT 0 - 55 U/L 31 30 60(H)   CEA  06/19/17: 3.38 07/06/17: 2.88   PATHOLOGY  Diagnosis 05/24/17  1. Stomach, biopsy, mass distal - ADENOCARCINOMA, SEE COMMENT. - CHRONIC ACTIVE GASTRITIS WITH INTESTINAL METAPLASIA. 2. 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 - NO DYSPLASIA OR MALIGNANCY. Microscopic Comment 1. The fragments are superficial and therefore assessment of invasion is hampered. Dr. Saralyn Pilar has reviewed the case. The case was called to Dr. [name] on 05/25/2017. 2. A Warthin-Starry stain is performed to determine the possibility of the presence of Helicobacter pylori. The Warthin-Starry stain is negative for organisms of Helicobacter pylori   PROCEDURES  EUS by Dr. [name]  on 06/07/17  IMPRESSION:  5cm across, partially circumferential uT2N0 gastric adenocarcinoma laying in the distal stomach 3-4cm proximal to the pylorus.    EGD by Dr. [name] 05/24/17 IMPRESSION Ulcerated mass like region in the distal stomach (4cm across), extensively biopsied. This appears neoplastic. - Abnormal mucosa throughout stomach otherwise (mild-moderate gastritis), also extensively biopsied.      RADIOGRAPHIC STUDIES: I have personally reviewed the radiological images as listed and agreed with the findings in the report. No results found.   ASSESSMENT & PLAN: Bobby Caldwell is a 62 y.o. [place] male with a history of heavy smoking and COPD, peripheral vascular disease, presents with fatigue, anorexia, early satiety and weight loss  1. COPD exacerbation 2. Gastric Adenocarcinoma, uT2N0M0, stage I 3. Anorexia, insomnia, nausea, weight loss 4. COPD, smoking cessation 5. Financial support 6. Personality change, depression, anxiety  Bobby Caldwell appears stable; has increased cough, sputum production, dyspnea, and wheezing consistent with COPD exacerbation. He reportedly takes symbicort but not clear if he takes spiriva or albuterol at home. Will treat with albuterol neb today in infusion room and steroid taper as outpatient; ordered DME nebulizer machine for home. I reviewed respiratory medication. Will cover bacterial pathogens with Zpak due to his immunosuppression. Will hold chemotherapy today and r/s treatment for 1 week. I encouraged him to try to quit smoking. I sent his pulmonologist Dr. Lenna Gilford an update.   He is s/p cycle 3 neoadjuvant FOLFOX; he did not tolerate last cycle well; he has increased fatigue, weakness, and nausea with vomiting. Compazine and zofran improve but do not resolve n/v. Will add Emend to chemo when he returns in 1 week to resume chemotherapy. Weight is stable. CBC and CMP stable. He continues to have intermittent epigastric pain secondary to  malignancy, I refilled tramadol today.  PLAN -COPD exacerbation, treat with steroids, zpak, albuterol -Ordered DME: neb machine for home -Refilled tramadol  -Hold chemotherapy, r/s in 1 week -f/u with next cycle  -500 CC NS today in infusion   Orders Placed This Encounter  Procedures  . DME Nebulizer machine    Order Specific Question:   Patient needs a nebulizer to treat with the following condition    Answer:   COPD exacerbation ([place]) [[id number]]   All questions were answered. The patient knows to call the clinic with any problems, questions or concerns. No barriers to learning was detected. I spent 20 minutes counseling the patient face to face. The total time spent in the appointment was  25 minutes and more than 50% was on counseling and review of test results.     Alla Feeling, NP 08/20/17   Addendum  I have seen the patient, examined him. I agree with the assessment and and plan and have edited the notes.   Bobby Caldwell appears to have a COPD exacerbation today, his wheezing improved with breathing treatment in our infusion room.  We will hold chemotherapy today, postponed to next week, and treat his COPD exacerbation.  Also encouraged him to follow-up with his pulmonologist.  We will see him back next week to resume chemotherapy if he recovers well.  [name]  08/23/2017

## 2017-08-20 NOTE — Telephone Encounter (Signed)
Printed avs and calender for upcoming appointment. Per 2/11 los 

## 2017-08-22 ENCOUNTER — Ambulatory Visit: Payer: BLUE CROSS/BLUE SHIELD | Admitting: Vascular Surgery

## 2017-08-22 ENCOUNTER — Encounter (HOSPITAL_COMMUNITY): Payer: BLUE CROSS/BLUE SHIELD

## 2017-08-23 ENCOUNTER — Encounter: Payer: Self-pay | Admitting: Nurse Practitioner

## 2017-09-02 NOTE — Progress Notes (Addendum)
[place]  [phone] [phone]) [phone]  Clinic Follow up Note   Patient Care Team: Minette Brine as PCP - General (General Practice) [name], MD as Consulting Physician (Pulmonary Disease) [name], MD as Consulting Physician (Hematology) [name], MD as Attending Physician (Gastroenterology) [name], MD as Consulting Physician (General Surgery)  Date of Service: 09/03/17   CHIEF COMPLAINT: F/u gastric cancer   SUMMARY OF ONCOLOGIC HISTORY: Oncology History   Cancer Staging Gastric cancer [facility]) Staging form: Stomach, AJCC 8th Edition - Clinical stage from 05/24/2017: Stage I (cT2, cN0, cM0) - Signed by [name], MD on 06/19/2017       Gastric cancer ([place])   05/24/2017 Procedure    EGD by Dr. [name] 05/24/17 IMPRESSION Ulcerated mass like region in the distal stomach (4cm across), extensively biopsied. This appears neoplastic. - Abnormal mucosa throughout stomach otherwise (mild-moderate gastritis), also extensively biopsied.      05/24/2017 Initial Biopsy    Diagnosis 05/24/17  1. Stomach, biopsy, mass distal - ADENOCARCINOMA, SEE COMMENT. - CHRONIC ACTIVE GASTRITIS WITH INTESTINAL METAPLASIA. 2. 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 - NO DYSPLASIA OR MALIGNANCY      06/04/2017 Initial Diagnosis    Gastric cancer ([place])      06/07/2017 Procedure    EUS by Dr. [name] on 06/07/17  IMPRESSION:  5cm across, partially circumferential uT2N0 gastric adenocarcinoma laying in the distal stomach 3-4cm proximal to the pylorus.      06/22/2017 Imaging    CT CAP W Contrast 06/22/17  IMPRESSION: 1. The gastric mass is somewhat inconspicuous on CT, but a potential 1.2 cm thick mucosal masses seen along the posterior gastric border on image 83/2. There several small adjacent right gastric lymph nodes measuring up to 0.7 cm in short  axis, but no overtly pathologic adenopathy and no findings of hepatic or peritoneal metastatic disease at this time. 2. Calcified granulomas in both lungs along with several noncalcified nodules stable from August 2016 and likely benign given this long-term stability. 3. Other imaging findings of potential clinical significance: Aortic Atherosclerosis (ICD10-I70.0) and Emphysema (ICD10-J43.9). Airway thickening is present, suggesting bronchitis or reactive airways disease. 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 Disc bulges at L3-4 and L4-5.      07/05/2017 Surgery    PAC placement       07/07/2017 -  Chemotherapy    Neoadjuvant FOLFOX with with leucovorin every 2 weeks for 6 cycles starting 07/07/17. Will start with reduced dose for cycle 1.      CURRENT THERAPY:neoadjuvant FOLFOX with leucovorin every 2 weeks for 6-8cycles starting 07/07/17   INTERVAL HISTORY: Mr. Burak returns for follow up as scheduled. He was last seen in infusion room on 08/20/17; chemo was held due to fatigue, weakness, and COPD exacerbation. He did not take steroid or antibiotics as prescribed. Reports his respiratory status is back to his baseline with stable dyspnea and productive cough. He remains dyspneic on exertion. Had EMS come to his home last week for difficulty breathing, they provided O2 at home and symptoms recovered to baseline. He sees Dr. Lenna Gilford in pulmonology 09/04/17. His general fatigue has only slightly improved with 4 weeks since last chemo; still spends most of his day resting at home, even slight activities are taxing for him. He complaints of ankle and wrist joint pain that began yesterday, increases with walking. He notes  mild ankle swelling and non itching rash on his lower legs. Right hand is mildly swollen but not tender or weak. He denies injury. Has intermittent tingling to hands and feet that began after starting  chemo, not limiting function. He continues to have frequent nausea, has not taken zofran or compazine. BM regular. Appetite remains decreased on 2 tabs remeron. He drinks well. Reports mouth burning without obvious sores and occasional difficulty digesting food.   REVIEW OF SYSTEMS:   Constitutional: Denies fevers or abnormal weight loss (+) Intermittent chills (+) no appetite, takes 2 tabs remeron (+) significant fatigue  Eyes: Denies blurriness of vision Ears, nose, mouth, throat, and face: Denies mucositis or sore throat (+) tongue burning  Respiratory: Denies wheezes (+) productive cough, stable (+) DOE, stable at baseline   Cardiovascular: Denies palpitation, chest discomfort (+) lower extremity swelling Gastrointestinal:  Denies vomiting, constipation, diarrhea, heartburn or change in bowel habits (+) frequent nausea (+) occasional difficulty digesting food   Skin: (+) abnormal skin rash, ankles/feet,  Lymphatics: Denies new lymphadenopathy or easy bruising Neurological:Denies numbness, tingling or new weaknesses Behavioral/Psych: Mood is stable, no new changes  MSK: (+) right hand, wrist swelling  All other systems were reviewed with the patient and are negative.  MEDICAL HISTORY:  Past Medical History:  Diagnosis Date  . COPD (chronic obstructive pulmonary disease) ([place])   . Dyspnea   . PAD (peripheral artery disease) ([place])   . Stomach cancer ([place])   . Tobacco abuse   . Weight loss     SURGICAL HISTORY: Past Surgical History:  Procedure Laterality Date  . ABDOMINAL AORTOGRAM W/LOWER EXTREMITY N/A 05/03/2017   Procedure: ABDOMINAL AORTOGRAM W/LOWER EXTREMITY;  Surgeon: Conrad [place], MD;  Location: [place];  Service: Cardiovascular;  Laterality: N/A;  . ESOPHAGOGASTRODUODENOSCOPY     05/24/17  . ESOPHAGOGASTRODUODENOSCOPY (EGD) WITH PROPOFOL N/A 05/24/2017   Procedure: ESOPHAGOGASTRODUODENOSCOPY (EGD) WITH PROPOFOL;  Surgeon: [name], MD;  Location: [facility]  ENDOSCOPY;  Service: Endoscopy;  Laterality: N/A;  . EUS N/A 06/07/2017   Procedure: UPPER ENDOSCOPIC ULTRASOUND (EUS) RADIAL;  Surgeon: [name], MD;  Location: [facility] ENDOSCOPY;  Service: Endoscopy;  Laterality: N/A;  . FEMORAL-FEMORAL BYPASS GRAFT Bilateral 05/08/2017   Procedure: LEFT TO RIGHT BYPASS GRAFT FEMORAL-FEMORAL ARTERY;  Surgeon: Conrad [place], MD;  Location: [facility];  Service: Vascular;  Laterality: Bilateral;  . IR FLUORO GUIDE PORT INSERTION RIGHT  07/05/2017  . IR US GUIDE VASC ACCESS RIGHT  07/05/2017  . NM MYOVIEW LTD  06/2017   LOW RISK. Small basal inferoseptal defect thought to be related to artifact (although cannot exclude prior infarct). Normal wall motion i in this area would suggest artifact not infarct. Otherwise no ischemia or infarction.  Marland Kitchen PERIPHERAL VASCULAR INTERVENTION Left 05/03/2017   Procedure: PERIPHERAL VASCULAR INTERVENTION;  Surgeon: Conrad [place], MD;  Location: [place] CV LAB;  Service: Cardiovascular;  Laterality: Left;  common iliac  . TRANSTHORACIC ECHOCARDIOGRAM  06/26/2017   Normal LV size and function. EF 60-65%. No wall motion abnormalities. GR 1 DD. Mild LA dilation    I have reviewed the social history and family history with the patient and they are unchanged from previous note.  ALLERGIES:  has No Known Allergies.  MEDICATIONS:  Current Outpatient Medications  Medication Sig Dispense Refill  . Albuterol Sulfate (PROAIR RESPICLICK) 527 (90 Base) MCG/ACT AEPB Inhale 2 puffs into the lungs every 6 (six) hours as needed. 1 each 6  . atorvastatin (LIPITOR)  10 MG tablet Take 1 tablet (10 mg total) by mouth daily. 30 tablet 2  . budesonide-formoterol (SYMBICORT) 160-4.5 MCG/ACT inhaler Inhale 2 puffs into the lungs 2 (two) times daily. 1 Inhaler 6  . clopidogrel (PLAVIX) 75 MG tablet Take 1 tablet (75 mg total) by mouth daily. 30 tablet 11  . dexamethasone (DECADRON) 4 MG tablet Take 2 tablets (8 mg total) by mouth daily. Start the day  after chemotherapy for 2 days. Take with food. 30 tablet 1  . lidocaine-prilocaine (EMLA) cream Apply to affected area once 30 g 3  . loperamide (IMODIUM A-D) 2 MG tablet Take 2 mg by mouth 4 (four) times daily as needed for diarrhea or loose stools.    Marland Kitchen LORazepam (ATIVAN) 0.5 MG tablet Take 1 tablet (0.5 mg total) by mouth every 6 (six) hours as needed (Nausea or vomiting). 30 tablet 0  . mirtazapine (REMERON) 7.5 MG tablet Take 1 tablet (7.5 mg total) by mouth at bedtime. 30 tablet 1  . ondansetron (ZOFRAN) 8 MG tablet Take 1 tablet (8 mg total) by mouth 2 (two) times daily as needed for refractory nausea / vomiting. Start on day 3 after chemotherapy. 30 tablet 1  . oxyCODONE-acetaminophen (ROXICET) 5-325 MG tablet Take 1 tablet by mouth every 6 (six) hours as needed for severe pain. 20 tablet 0  . prochlorperazine (COMPAZINE) 10 MG tablet Take 1 tablet (10 mg total) by mouth every 6 (six) hours as needed (Nausea or vomiting). 30 tablet 1  . Tiotropium Bromide Monohydrate (SPIRIVA RESPIMAT) 2.5 MCG/ACT AERS Inhale 2 puffs into the lungs daily. (Patient taking differently: Inhale 1 puff into the lungs daily. ) 1 Inhaler 6  . traMADol (ULTRAM) 50 MG tablet Take 1 tablet (50 mg total) by mouth every 6 (six) hours as needed. 30 tablet 0  . dronabinol (MARINOL) 2.5 MG capsule Take 1 capsule (2.5 mg total) by mouth 2 (two) times daily before a meal. 20 capsule 0  . methylPREDNISolone (MEDROL DOSEPAK) 4 MG TBPK tablet Take as directed 21 tablet 0  . nystatin (MYCOSTATIN) 100000 UNIT/ML suspension Take 5 mLs (500,000 Units total) by mouth 4 (four) times daily for 7 days. 240 mL 0   No current facility-administered medications for this visit.     PHYSICAL EXAMINATION: ECOG PERFORMANCE STATUS: 3 - Symptomatic, >50% confined to bed  Vitals:   09/03/17 0937  BP: 136/78  Pulse: 88  Resp: (!) 24  Temp: 98.4 F (36.9 C)  SpO2: 94%   Filed Weights   09/03/17 0937  Weight: 119 lb 6.4 oz (54.2 kg)     GENERAL:alert, no distress and comfortable SKIN: skin color, texture, turgor are normal, no significant lesions (+) scattered flat rash to lower extremities and feet  EYES: normal, Conjunctiva are pink and non-injected, sclera clear OROPHARYNX:no exudate, no erythema and lips, buccal mucosa are normal (+) white coating to tongue   NECK: supple, thyroid normal size, non-tender, without nodularity LYMPH:  no palpable cervical, supraclavicular, axillary, or inguinal lymphadenopathy  LUNGS: clear to auscultation bilaterally with normal breathing effort HEART: regular rate & rhythm and no murmurs (+) bilateral pitting lower extremity/pedal edema (+) peripheral pulses normal ABDOMEN:abdomen soft, non-tender and normal bowel sounds Musculoskeletal:no cyanosis of digits and no clubbing (+) mild right hand and wrist edema (+) ROM intact (+) extremities are warm. ROM intact  NEURO: alert & oriented x 3 with fluent speech, no focal motor/sensory deficits PAC without erythema   LABORATORY DATA:  I have reviewed the  data as listed CBC Latest Ref Rng & Units 09/03/2017 08/20/2017 08/06/2017  WBC 4.0 - 10.3 K/uL 9.8 6.5 9.0  Hemoglobin 13.0 - 17.1 g/dL 13.6 14.2 15.2  Hematocrit 38.4 - 49.9 % 41.0 42.5 44.8  Platelets 140 - 400 K/uL 286 177 141     CMP Latest Ref Rng & Units 09/03/2017 08/20/2017 08/06/2017  Glucose 70 - 140 mg/dL 97 111 78  BUN 7 - 26 mg/dL 9 9 7   Creatinine 0.70 - 1.30 mg/dL 0.56(L) 0.71 0.61(L)  Sodium 136 - 145 mmol/L 136 140 139  Potassium 3.5 - 5.1 mmol/L 3.7 3.7 4.2  Chloride 98 - 109 mmol/L 98 103 102  CO2 22 - 29 mmol/L 30(H) 29 31(H)  Calcium 8.4 - 10.4 mg/dL 8.7 8.8 8.9  Total Protein 6.4 - 8.3 g/dL 6.2(L) 6.7 6.7  Total Bilirubin 0.2 - 1.2 mg/dL 0.3 0.5 0.4  Alkaline Phos 40 - 150 U/L 101 115 117  AST 5 - 34 U/L 16 27 16   ALT 0 - 55 U/L 25 31 30       RADIOGRAPHIC STUDIES: I have personally reviewed the radiological images as listed and agreed with the findings  in the report. No results found.   ASSESSMENT & PLAN: Nakoa Guriis a 62 y.o.[place] male with a history of heavy smoking and COPD, peripheral vascular disease, presents with fatigue, anorexia, early satiety and weight loss  1. Gastric Adenocarcinoma, uT2N0M0, stage I -Mr. Briggs appears stable. He completed 3 cycles neoadjuvant FOLFOX, complicated by severe fatigue, nausea, anorexia. Last treated on 08/06/17. His overall condition has improved somewhat, however he presents today with peripheral edema and vague skin rash. Labs reviewed, adequate to continue chemotherapy. Will hold cycle 4 chemo again today, r/s later this week. Due to his poor tolerance, Dr. Burr Medico plans to give 1 more cycle then likely restage. F/u in 2 weeks.   2. Lower extremity/pedal edema with skin rash, right hand/wrist edema  -Has new onset edema, lower extremity rash, with associated wrist and ankle joint aches. S/p peripheral vascular catheterization 04/2017 but doubt this is related. Could be arthritic. Will treat with medrol dose pack and monitor.   3. Oral candidiasis  -Secondary to immunosuppression. He reports burning on his tongue with white coating. I prescribed oral nystatin 4x daily for 1 week.   4. COPD exacerbation  -He did not take the medrol dose pack or zithromax as prescribed on 08/20/17, but appears his respiratory status is back at baseline. He did not get nebulizer machine for his home yet. Has f/u with Dr. Lenna Gilford 2/26. Will monitor.   5. Anorexia, insomnia, nausea, weight loss -Secondary to malignancy and chemotherapy. Has not had much improvement in appetite with increased remeron. I prescribed marinol 1 cap BID w meals to boost his appetite.   6. COPD, smoking cessation -He continues to smoke and has been encouraged to quit. Followed by pulmonologist Dr. Lenna Gilford  7. Financial support -Has been seen by SW.   8. Personality change, depression, anxiety -Has previously declined counseling. On Remeron  for appetite, mood change, and sleep. Will monitor closely.    PLAN: -Prescriptions: Medrol dose pack for swelling and rash; marinol for appetite stimulant; Nystatin for oral candidiasis  -Hold chemo today, r/s cycle 4 FOLFOX to 2/28 -Return for f/u and cycle 5 in 2 weeks  All questions were answered. The patient knows to call the clinic with any problems, questions or concerns. No barriers to learning was detected. I spent 35 minutes counseling the  patient face to face. The total time spent in the appointment was 45 minutes and more than 50% was on counseling and review of test results     Alla Feeling, NP 09/04/17   Addendum  I have seen the patient, examined him. I agree with the assessment and and plan and have edited the notes.   Mr. Jump has developed multiple joint swelling (right wrist and hand, b/l ankles) and some skin rash aound ankles. I am not sure the cause, will try a short course steroids to see if improves. Will postpone his chemo to late this week, and plan to repeat staging scan after this or next cycle chemo. Due to his poor tolerance to chemo, I will discuss with Dr. [name] to see if she will offer surgery soon.   [name]  09/03/2017

## 2017-09-03 ENCOUNTER — Inpatient Hospital Stay: Payer: BLUE CROSS/BLUE SHIELD

## 2017-09-03 ENCOUNTER — Telehealth: Payer: Self-pay | Admitting: Hematology

## 2017-09-03 ENCOUNTER — Inpatient Hospital Stay (HOSPITAL_BASED_OUTPATIENT_CLINIC_OR_DEPARTMENT_OTHER): Payer: BLUE CROSS/BLUE SHIELD | Admitting: Nurse Practitioner

## 2017-09-03 VITALS — BP 136/78 | HR 88 | Temp 98.4°F | Resp 24 | Ht 66.0 in | Wt 119.4 lb

## 2017-09-03 DIAGNOSIS — C162 Malignant neoplasm of body of stomach: Secondary | ICD-10-CM

## 2017-09-03 DIAGNOSIS — C160 Malignant neoplasm of cardia: Secondary | ICD-10-CM | POA: Diagnosis not present

## 2017-09-03 DIAGNOSIS — Z95828 Presence of other vascular implants and grafts: Secondary | ICD-10-CM | POA: Diagnosis not present

## 2017-09-03 DIAGNOSIS — J449 Chronic obstructive pulmonary disease, unspecified: Secondary | ICD-10-CM

## 2017-09-03 DIAGNOSIS — F17210 Nicotine dependence, cigarettes, uncomplicated: Secondary | ICD-10-CM | POA: Diagnosis not present

## 2017-09-03 DIAGNOSIS — C169 Malignant neoplasm of stomach, unspecified: Secondary | ICD-10-CM | POA: Diagnosis not present

## 2017-09-03 DIAGNOSIS — E440 Moderate protein-calorie malnutrition: Secondary | ICD-10-CM | POA: Diagnosis not present

## 2017-09-03 DIAGNOSIS — B370 Candidal stomatitis: Secondary | ICD-10-CM

## 2017-09-03 LAB — CBC WITH DIFFERENTIAL/PLATELET
Basophils Absolute: 0.1 10*3/uL (ref 0.0–0.1)
Basophils Relative: 1 %
Eosinophils Absolute: 0.1 10*3/uL (ref 0.0–0.5)
Eosinophils Relative: 1 %
HCT: 41 % (ref 38.4–49.9)
HEMOGLOBIN: 13.6 g/dL (ref 13.0–17.1)
LYMPHS PCT: 14 %
Lymphs Abs: 1.4 10*3/uL (ref 0.9–3.3)
MCH: 28.4 pg (ref 27.2–33.4)
MCHC: 33.2 g/dL (ref 32.0–36.0)
MCV: 85.5 fL (ref 79.3–98.0)
Monocytes Absolute: 0.8 10*3/uL (ref 0.1–0.9)
Monocytes Relative: 8 %
NEUTROS ABS: 7.5 10*3/uL — AB (ref 1.5–6.5)
NEUTROS PCT: 76 %
Platelets: 286 10*3/uL (ref 140–400)
RBC: 4.79 MIL/uL (ref 4.20–5.82)
RDW: 15.8 % — ABNORMAL HIGH (ref 11.0–14.6)
WBC: 9.8 10*3/uL (ref 4.0–10.3)

## 2017-09-03 LAB — COMPREHENSIVE METABOLIC PANEL
ALBUMIN: 2.6 g/dL — AB (ref 3.5–5.0)
ALK PHOS: 101 U/L (ref 40–150)
ALT: 25 U/L (ref 0–55)
ANION GAP: 8 (ref 3–11)
AST: 16 U/L (ref 5–34)
BUN: 9 mg/dL (ref 7–26)
CALCIUM: 8.7 mg/dL (ref 8.4–10.4)
CO2: 30 mmol/L — ABNORMAL HIGH (ref 22–29)
Chloride: 98 mmol/L (ref 98–109)
Creatinine, Ser: 0.56 mg/dL — ABNORMAL LOW (ref 0.70–1.30)
GFR calc Af Amer: >60 mL/min (ref >60)
GFR calc non Af Amer: >60 mL/min (ref >60)
GLUCOSE: 97 mg/dL (ref 70–140)
Potassium: 3.7 mmol/L (ref 3.5–5.1)
Sodium: 136 mmol/L (ref 136–145)
TOTAL PROTEIN: 6.2 g/dL — AB (ref 6.4–8.3)
Total Bilirubin: 0.3 mg/dL (ref 0.2–1.2)

## 2017-09-03 MED ORDER — SODIUM CHLORIDE 0.9% FLUSH
10.0000 mL | Freq: Once | INTRAVENOUS | Status: AC
  Administered 2017-09-03: 10 mL
  Filled 2017-09-03: qty 10

## 2017-09-03 MED ORDER — NYSTATIN 100000 UNIT/ML MT SUSP: 5.0000 mL | Freq: Four times a day (QID) | OROMUCOSAL | 0 refills | Status: DC

## 2017-09-03 MED ORDER — DRONABINOL 2.5 MG PO CAPS
2.5000 mg | ORAL_CAPSULE | Freq: Two times a day (BID) | ORAL | 0 refills | Status: DC
Start: 2017-09-03 — End: 2018-07-25

## 2017-09-03 MED ORDER — HEPARIN SOD (PORK) LOCK FLUSH 100 UNIT/ML IV SOLN
500.0000 [IU] | Freq: Once | INTRAVENOUS | Status: AC
  Administered 2017-09-03: 500 [IU]
  Filled 2017-09-03: qty 5

## 2017-09-03 NOTE — Telephone Encounter (Signed)
Appointment scheduled AVS/Calendar printed per 2/25 los

## 2017-09-04 ENCOUNTER — Ambulatory Visit (INDEPENDENT_AMBULATORY_CARE_PROVIDER_SITE_OTHER): Payer: BLUE CROSS/BLUE SHIELD | Admitting: Pulmonary Disease

## 2017-09-04 ENCOUNTER — Encounter: Payer: Self-pay | Admitting: Nurse Practitioner

## 2017-09-04 VITALS — BP 116/80 | HR 91 | Temp 97.5°F | Ht 66.0 in | Wt 118.2 lb

## 2017-09-04 DIAGNOSIS — J432 Centrilobular emphysema: Secondary | ICD-10-CM | POA: Diagnosis not present

## 2017-09-04 DIAGNOSIS — E440 Moderate protein-calorie malnutrition: Secondary | ICD-10-CM | POA: Diagnosis not present

## 2017-09-04 DIAGNOSIS — R0902 Hypoxemia: Secondary | ICD-10-CM | POA: Diagnosis not present

## 2017-09-04 DIAGNOSIS — R0789 Other chest pain: Secondary | ICD-10-CM

## 2017-09-04 DIAGNOSIS — I70213 Atherosclerosis of native arteries of extremities with intermittent claudication, bilateral legs: Secondary | ICD-10-CM | POA: Diagnosis not present

## 2017-09-04 DIAGNOSIS — R079 Chest pain, unspecified: Secondary | ICD-10-CM | POA: Diagnosis not present

## 2017-09-04 DIAGNOSIS — C160 Malignant neoplasm of cardia: Secondary | ICD-10-CM | POA: Diagnosis not present

## 2017-09-04 MED ORDER — TIOTROPIUM BROMIDE MONOHYDRATE 2.5 MCG/ACT IN AERS: 2.0000 | INHALATION_SPRAY | Freq: Every day | RESPIRATORY_TRACT | 6 refills | Status: DC

## 2017-09-04 MED ORDER — BUDESONIDE-FORMOTEROL FUMARATE 160-4.5 MCG/ACT IN AERO: 2.0000 | INHALATION_SPRAY | Freq: Two times a day (BID) | RESPIRATORY_TRACT | 6 refills | Status: DC

## 2017-09-04 MED ORDER — ALBUTEROL SULFATE 108 (90 BASE) MCG/ACT IN AEPB: 2.0000 | INHALATION_SPRAY | Freq: Four times a day (QID) | RESPIRATORY_TRACT | 6 refills | Status: DC | PRN

## 2017-09-04 NOTE — Patient Instructions (Addendum)
Today we updated your med list in our EPIC system...    Continue your current medications the same...  We refilled your inhalers>>    SYMBICORT250- 2 inhalations twice daily...    SPIRIVA Respimat- 2 inhalations once daily...    PROAIR-HFA - 1-2 inhalations every 4-6H as needed for wheezing...  We did a walking oxygen test on you today in the office and you dropped your oxygen level to 85%    We will arrange for OXYGEN you can use at home--       Use 1L/min flow at night while sleeping...       Use 2L/min flow when ambulating & getting about to chemotherapy etc...  For the swelling in your legs--    You MUST eliminate all salt/sodium from your diet.    Wear support hose on your legs...    Keep your legs elevated as much as possible...  Your nutrition is poor & your low serum albumin in your blood is partially responsible for the swelling...    You need to eat better, more protein, better nutrition to improve your serum albumin level...  If you have any chest pain-- you need to go to the emergency room for evaluation.  YOU NEED TO QUIT ALL SMOKING BEFORE IT IS TOO LATE...    YOU CANNOT SMOKE WITH OXYGEN IN YOUR HOME...  Let's plan a follow up visit in 65months, sooner if needed for problems.Marland KitchenMarland Kitchen

## 2017-09-05 ENCOUNTER — Encounter: Payer: Self-pay | Admitting: Pulmonary Disease

## 2017-09-05 NOTE — Progress Notes (Signed)
Subjective:     Patient ID: Bobby Caldwell, male   DOB: April 29, 1956, 62 y.o.   MRN: [id number]  HPI ~  February 16, 2015:  Initial pulmonary consult by SN>   78 y/o gentleman from [place], referred by Minette Brine NP at [facility] (Dr. Glendale Chard), for pulmonary evaluation due to emphysema>      Bobby Caldwell has been a heavy smoker- started at age 62 y/o when they would "roll your own" & smoked continually up to 2ppd for ~23yr before decr to 1ppd more recently, est ~70-80 pack-yr smoking hx... He c/o SOB/ DOE, notes mild cough, sm amt beige sput, no hemptysis, occas sharp CP & palpit, no edema...  He denies signif hx of active lung problems- w/o recurrent bronchitic infections, pneumonia, Tb or known exposure, asthma, etc;  Neither has he had any hx signif cardiac problems- w/o known CAD, CHF, etc... He denies occupational exposures to asbestos, silica, mining, etc...      Bobby Caldwell also notes weight loss- states that his regular weight is ~150# & he currently weighs 113#, 5'7" Tall, BMI=17-18; appetite is poor & is rec to add nutritional supplements Bid betw meals...      Current Meds> NONE- he works at [employer] are too $$ for him... EXAM showed Afeb, VSS, O2sat=97% on RA;  HEENT- neg, mallampati1;  Chest- decr BS bilat w/ sl end-exp wheezing;  Heart- RR w/o m/r/g;  Abd- soft, neg;  Ext- w/o c/c/e;  Neuro- no focal neuro deficits...  CXR 01/15/15 showed hyperinflation, emphysema, NAD...  Spirometry 02/16/15 showed FVC=2.66 (61%), FEV1=1.25 (36%), %1sec=47, mid-flows reduced at 19% predicted... This is c/w severe airflow obstruction and GOLD Stage 3 COPD.  Ambulatory oxygen saturation test 02/16/15 showed O2sat=98% on RA at rest w/ pulse=86/min; he ambulated 3 laps w/ lowest O2sat=98% w/ HR=111/min...  LABS 01/2015 [facility] w[facility] & scanned into EPIC...   CT Chest w/o contrast> pending (see below) IMP/PLAN>>  Bobby Caldwell has been a life long smoker & has paid a price for  this behavior w/ severe emphysema; despite still working at [employer]/ 4 daughters to support- he continues to smoke 1ppd @$5 per pack= #150/mo & I have appealed to his parental instincts to quit smoking completely but he is not optimistic that he will be able to do so & unwilling to purchase smoking cessation aides like Chantix, Nicotine replacement, etc... In addition to smoking cessation we have prescribed SYMBICORT160- 2spBid, & SPIRIVA daily, along w/ ProairHFA rescue inhaler for prn use (coupons given taking advantage of all known discount programs)... For completeness I have rec a CT Chest (esp in light of his wt loss) and his PCP may want to consider CT Abd & Pelvis as well... He is rec to start eating better, 3 meals/day, plus nutritional supplements Bid;  Finally he needs the full benefits for avail immunization programs=> Prevnar-13, Pneumovax-23, Flu shots, etc (we will leave this to his PCP to administer)... Rec ROV recheck in 6 weeks. ADDENDUM>>  CT Chest 02/22/15 showed severe centrilobular emphysema, no adenopathy, several small pulm nodules- largest is 622min LUL & RML... rec f/u CT in 5m52mo~  March 30, 2015:  6wk ROV w/ SN>        Bobby Caldwell reports that he is improved on the Symbicort160-2spBid, Spiriva daily, and the AlbutHFA prn (hasn't needed);  He notes that he's decr the smoking to 7-10 cig/d now & we discussed the need to quit completely;  He notes min cough, no sputum or hemoptysis, stable SOB/DOE, no CP/ palpit/ or edema...  EXAM showed Afeb, VSS, O2sat=95% on RA;  HEENT- neg, mallampati1;  Chest- decr BS bilat w/ sl end-exp wheezing;  Heart- RR w/o m/r/g;  Abd- soft, neg;  Ext- w/o c/c/e;  Neuro- no focal neuro deficits... IMP/PLAN>>  Bobby Caldwell is improved on the inhalers and we provided him w/ some samples and Rx but it is unclear if he will be able to afford them- discount cards & drug company programs have been provided... We plan ROV 3-4 months...  ~ August 02, 2015: 42moROV  w/ SN>   Bobby Caldwell returns, unaccompanied for today's visit, states his breathing is unchanged- c/o DOE w/ min activ, cough w/ yellow sput; he denies CP, hemoptysis, edema; he is still able to perform ADLs but SOB w/ walking/ stairs/ etc; he reports that he has decreased his smoking from 2.5ppd to 1ppd at present; ?if he is really using his [place] daily? He admits to running out of his AlbutHFA rescue inhaler...  Severe COPD/ Emphysema> He has evid for severe airflow obstruction, GOLD Stage3 COPD/ Emphysema, no desaturation w/ exercise, CTChest 02/2015 showed severe centrilob emphysema & several small nodules- largest 644min LUL & RML; On Symbicort160-2spBid & Spiriva Respimat-2sp once daily, and AlbutHFA rescue inhaler as needed...  Cigarette smoker> He has been smoking regularly since age 62,6est 80+pack-yr smoking hx, he is proud that he has decr fro 2.5ppd to 1ppd currently; we discussed this & I stressed the need to quit completely...  Weight loss> He says his norm wt was ~150# and he has lost down to 114#; we reviewed nutrition, advised Ensure/ Sustacal/ Boost daily... EXAM showed Afeb, VSS, O2sat=95% on RA; HEENT- neg, mallampati1; Chest- decr BS bilat w/ sl end-exp wheezing; Heart- RR w/o m/r/g; Abd- soft, neg; Ext- w/o c/c/e; Neuro- no focal neuro deficits... IMP/PLAN>> Pt has severe COPD/emphysema & still smoking 1ppd- must quit all smoking & take his Symbicort/ Spiriva regularly & AlbutHFA as needed; He can use MUCINEX600- 2Bid w/ fluids for thick phlegm. We plan ROV 73m38mowill plan f/u CTChest at that time...  ~  January 10, 2016:  73mo73mo w/ SN>     Bobby Caldwell is here today w/ his friend & neighbor "like my nephew" named [name] phone #[phone] is still smoking 1ppd and can't vs won't quit, not motivated despite my harsh warnings and the fact that he has 3 daughters;  He remains SOB w/ min activity &  ADLs and estimates that it's been ~88yrs39yrce he could do ADLs w/o the dyspnea;  Notes mild cough, sm amt thick greenish sput, no hemoptysis; notes sl chest discomfort but no severe pain or pleurisy, no f/c/s, no real COPD exac in the interval;  He also reports poor appetite & not eating well, weight is down 5# to 109# today... He informs me that [facility]- sched to ret 8/28...  We reviewed the following medical problems during today's office visit >>   Severe COPD/ Emphysema> He has evid for severe airflow obstruction, GOLD Stage3 COPD/ Emphysema, no desaturation w/ exercise, CTChest 02/2015 showed severe centrilob emphysema & several small nodules- largest 73mm i68mUL & RML; On Symbicort160-2spBid & Spiriva Respimat-2sp once daily, and AlbutHFA rescue inhaler as needed;  SOB w/ ADLs at baseline, he has not had any signif resp exac in  the interval...    Bilat small pulm nodules on CT chest 02/2015 + centrilobular emphysema>  He is due for f/u CT Chest to assertain if any of the lesions has grown in the interim...  Cigarette smoker> He has been smoking regularly since age 44, est 80+pack-yr smoking hx, he is proud that he has decr fro 2.5ppd to 1ppd currently; we discussed this & I stressed the need to quit completely...  Weight loss/ underweight/ poor appetite> He says his norm wt was ~150# and he has lost down to 109#; we reviewed nutrition, advised Ensure/ Sustacal/ Boost daily... EXAM showed Afeb, VSS, O2sat=94% on RA; Wt=109#, 5'7"Tall, BM17;  HEENT- neg, mallampati1; Chest- decr BS bilat w/ sl end-exp wheezing; Heart- RR w/o m/r/g; Abd- soft, neg; Ext- w/o c/c/e; Neuro- no focal neuro deficits...  Spirometry 01/10/16>  FVC=2.93 (69%), FEV1=1.03 (31%), %1sec=35%, mid-flows reduced at 16% predicted... This is c/w severe airflow obstruction & GOLD Stage 3-4 COPD  Ambulatory Oximetry 01/10/16>  O2sat=95% on RA at rest w/ pulse=99/min;  He ambulated 3 laps w/  lowest O2sat=95% w/ pulse=99/min; no desaturations...   CTChest >> DONE 01/17/16> norm heart size, atherosclerotic changes in Ao & LAD, no adenopathy, mod centrilob emphysema w/ diffuse bronchial wall thickening, bilat calcif granuloma, 19m RML & LUL nodules + several smaller bilat nodules- NO CHANGES from 2016, no new lesions, & no consolidative airsp dis... We plan repeat CT in 165yr. IMP/PLAN>>  Bobby Caldwell understand that he must quite all smoking immediately; his FEV1 has deteriorated 18% in the last yr down to 1.03L;  His oxygenation remains stable w/o desat;  He is asked to quit all smoking, stay on the Symbicort160-2spBid, spiriva Respimat- 2sp once daily, and the ProairHFA rescue inhaler prn;  He is again asked to start nutritional supplements w/ Ensure/ Sustacal/ Boost at least bid; he understands that w/o smoking cessation that his prognosis is very poor;  He works at [employer] asking about disability- I supported his application for this & explained the diff betw disability from a work poActuarys SSI;  His f/u CT Chest is pending...   ~  March 26, 2017:  1437moV & Bobby Caldwell returns after a long hiatus looking very gaunt & weak-- he has lost more weight & now weighs 100#, ~5'7"Tall, BMI=16; and still smoking daily- he admits to 3/4 ppd but has no interest in quitting smoking w/ mult excuses why HE cannot quit... His CC today is weakness & inability to walk w/ leg pain which is worse than his SOB/DOE from his emphysema... He is [place] little english-- we used a traOptometristdy over the speaker phone to communicate w/ him & this was quite satisfactory (call interpreter line at [phone] set this up & if he is at home a 3-way conf call can be set up using his home phone #[phone].    We reviewed the following medical problems during today's office visit >> he has not had any medical attention since he was here last- no PCP, no known ER visits, etc...  Severe COPD/  Emphysema> severe airflow obstruction, GOLD Stage3-4 COPD/ Emphysema, no desaturation w/ exercise, CTChest 02/2015 showed severe centrilob emphysema & several small nodules- largest 6mm27m LUL & RML (no change serially); On Symbicort160-2spBid & Spiriva Respimat-2sp once daily, Mucinex and AlbutHFA rescue inhaler as needed;  SOB w/ ADLs at baseline x yrs, he has not had any signif resp exac in the interval, he says he is more lim by his  leg symptoms...    Bilat small pulm nodules on CT chest 02/2015 + centrilobular emphysema> small bilat lung nodules are unchanged serial w/ CT scans in 2016, 2017, 2018...  Cigarette smoker> He has been smoking regularly since age 89, est 80+pack-yr smoking hx, he is proud that he has decr from 2.5ppd to 1ppd & will try to decr to 1/2ppd; we discussed this & I stressed the need to quit completely!    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  Weight loss/ underweight/ poor appetite> He says his norm wt was ~150# and he has lost down to 100#; we reviewed nutrition, advised 3 meals + Ensure/ Sustacal/ Boost daily... EXAM showed Afeb, VSS w/ BP=150/92, O2sat=96% on RA at rest; Wt=100#, 5'7"Tall, BMI=16;  HEENT- neg, mallampati1; Chest- decr BS bilat w/ prolonged exp phase & congested cough; Heart- RR w/o m/r/g; Abd- thin, soft, sl tender to palp +abd bruits; Ext- w/o c/c/e but pulses are diminished distally + left femoral bruit; Neuro- no focal neuro deficits...  CXR 03/26/17 (independently reviewed by me in the PACS system)> he did not go to our [place] for this requested CXR...  Ambulatory Oximetry on RA 03/26/17> O2sat=96% on RA at rest w/ pulse=105/min;  He ambulated only  2 laps in the office (185'ea) w/ lowest O2sat=96% w/ pulse=107/min;  He stopped due to leg weakness & pain...  LABS 03/26/17>  Chems- wnl w/ Cr=0.59, norm LFTs;  CBC- wnl w/ Hg=16.3;  TSH=0.59;  BNP=25;  Sed=2...  CT Chest/ Abd/ Pelvis 04/02/17> norm heart size, aortic atherosclerosis but ?no coronary calcif noted; no adenopathy & esoph is ok; diffuse bronchial wall thickening & mod centrilob+paraseptal emphysema; mult small pulm nodules scat bilaterally similar & w/o change from prev scans 2016&2017 (largest RML= 6x57m), no suspicious masses or consolidative airsp dis;  In the Abd/Pelvis small calcif granuloma in liver, otherw neg hepatobiliart tree, GB looks ok, pancreas ok, adrenals/ kidneys/ bladder- all ok; Aortic atherosclerosis w/ fusiform ectasia of infrarenal AA up to 2.6x2.3cm w/ abundant mural thrombus & complete occlusion of the right common iliac art & reconstitution of flow distally, +severe stenosis of prox left common iliac art; prostate ok & no bone lesions identified...  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) and is still smoking w/ a 70-80 pack yr hx, proud of the fact that he has cut back by a few cigarettes, refuses Chantix etc;  Asked to quit completely ASAP (says he'll try to decr to 10 cig/d) & continue resp treatments w/ Symbicort160-2spBid, Spiriva Respimat-2sp daily, Mucinex & Proair;  He is most concerned about his progressive leg symptoms=> clearly related to his severe peripheral vasc dis (ectatic distal Ao w/ mural thrombus, bilat common iliac dis, etc) and he is in need of ASAP eval by [facility] where arteriography will hopefully point to bypass surg that will help his symptoms;  In addition his weight loss & prot-cal malnutrition is alarming- thankfully his labs are essentially wnl & we reviewed nutritional supplements w/ ensure/ sustacal/ carnation instant/ etc...   NOTE:  >50% of this 536m appt was spent in  counseling & coordination of care  Appt w/ [facility] set  up for 04/27/17 at 11:00AM w/ DrChen  ADDENDUM>> ArtDopplers done 04/19/17>  c/w severe arterial disease R>L w/ abn toe-brachial indices as well => he has appt w/ [facility] on 10/19 at 11:00AM..Marland Kitchen   ~  September 04, 2017:  5 month ROV & pulmonary follow up visit>  A lot has happened to Bobby Caldwell in the last 5 months>   CT Abd/Pelvis & ArtDopplers of LEs 9-04/2017 confirmed severe atherosclerotic vasc dis w/ ectasia of infrarenal Ao w/ mural thrombus, an occluded R-iliac art & hi-grade L-common iliac stenosis, ABI's were 0.46R & 0.53L...  DrChen did Aortogram & subseq L=>R Fem-Fem bypass graft 04/2017, pt disch on Plavix & Lipitor added...  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  Oncology consult DrFeng11/2018 w/ decision for FOLFOX chemorx, started 07/06/17  Cardiology eval by DrHarding 06/2017 w/ atypical CP & known PAD; they did Echo/ Myoview (plan for CT Coronary Angio was cancelled)...  His PCP is now Minette Brine     Bobby Caldwell returns now after the 77mohiatus for pulmonary recheck> known severe COPD, hx heavy smoker & still smoking 1/2ppd (he readily admits that he will never stop smoking despite the cost to his health & his family);  His last spirometry showed FEV1=1.03L (31% predicted) and we have been treating him w/ SYMBICORT160-2spBid, Spiriva Respimat- 2sp once daily, Mucinex6013m1-2Bid w/ fluids, and ProairHFA rescue inhaler as needed;  His compliance has been poor & costs may be a factor but he is still smoking!!!  He has NOT required Oxygen to this point as his ambulatory O2 sat tests have not shown desaturations w/ his limited activity...  He had some chest discomfort w/ incr SOB recently & called 911 => EMS came to his house, gave him some oxygen & he felt better, refused t go to the ER, he thinks the O2 made him  better & wants some- I explained how this works, how he must qualify if he wants insurance to cover this (unless he wants to self pay which he cannot afford) & we discussed eval w/ EKG, 2DEcho, another Ambulatory O2 test    We reviewed the following medical problems during today's office visit>   Severe COPD/ Emphysema> severe airflow obstruction, GOLD Stage3-4 COPD/ Emphysema, no desaturation w/ exercise until today, CTChest 02/2015 showed severe centrilob emphysema & several small nodules- largest 53m52mn LUL & RML (no change on serial scans); On Symbicort160-2spBid & Spiriva Respimat-2sp once daily, Mucinex and AlbutHFA rescue inhaler as needed;  SOB w/ ADLs at baseline x yrs, he has not had any signif resp exac in the interval, he says he was more lim by his leg symptoms...    Bilat small pulm nodules on CT chest 02/2015 + centrilobular emphysema> small bilat lung nodules are unchanged serial w/ CT scans in 2016, 2017, 2018...  Cigarette smoker> He has been smoking regularly since age 67, 16st 80+pack-yr smoking hx, he is proud that he has decr from 2.5ppd to 1ppd & will try to decr to 1/2ppd; we discussed this & I stressed the need to quit completely!    ASPVD> he has claudication & vasc insuffic => CT Abd w/ atherosclerotic changes=> fusiform ectasia of infrarenal AA up to 2.6x2.3cm w/ abundant mural thrombus & complete occlusion of the right common iliac art & reconstitution of flow distally, +severe stenosis of prox left common iliac art;  Pt referred to  [facility] w/ abn ABIs bilat & subseq L=>R Fem-Fem bypass graft 04/2017, pt disch on Plavix & Lipitor added...  Weight loss/ underweight/ poor appetite => ADENOCARCINOMA of the Stomach> He says his norm wt was ~150# and he has lost down to 100#; we reviewed nutrition, advised 3 meals + Ensure/ Sustacal/ Boost daily; Pt referred to GI-DrJacobs w/ EGD showing Adenocarcinoma of the stomach => referred to Oncology- DrFeng for chemorx & Surg-  DrByerly... EXAM showed Afeb, VSS w/ BP=116/80, O2sat=95% on RA at rest; Wt=118#, 5'7"Tall, BMI=18;  HEENT- neg, mallampati1; Chest- decr BS bilat w/ prolonged exp phase & congested cough; Heart- RR w/o m/r/g; Abd- thin, soft, sl tender to palp +abd bruits; Ext- w/o c/c/e but pulses are diminished distally + left femoral bruit; Neuro- no focal neuro deficits...  Ambulatory Oxygen Test>  O2sat=95% on RA at rest w/ pulse=64/min;  He ambulated just one lap in the office ([place] w/ nadir O2sat=85% w/ pulse=105/min... HE NOW QUALIFIES FOR PORTABLE O2 SYSTEM.Marland KitchenMarland Kitchen  LABS 09/03/17>  Chems- lytes- ok , Cr=0.56, ALB=2.6;  CBC- Hg=13.6, wbc=9.8.Marland KitchenMarland Kitchen  IMP/PLAN>>  I again implored Bobby Caldwell to quit all smoking but he is not moved- indicates that 10cig/d is not that bad & he will not be able to cut this down further;  He now qualifies for Home O2 & we will set him up w/ home oxygen at 1L/min Qhs and 2L/min w/ activity (he indicated that he would only use it when he felt he needed it for SOB);  Finally we reviewed the need for regular dosing of his medications- Symbicort160-2spBid, spiriva respimat- 2sp once daily, Mucinex600-1to2Bid w/ fluids, and the ProairHFA as needed;  He certainly has a lot on him w/ stomach cancer (s/p chemorx & considering surg), PAD- s/p fem-fem bypass, other vasc dis, wt loss/ poor nutrition, etc...    Referred by Minette Brine NP at [facility] (Dr. Glendale Chard) --- PMHx, [facility]urgHx, [facility], and SocHx per PCP is reviewed... Limited english hampers medical history>  He is [place] & speaks little english-- we used a Optometrist over the speaker phone to communicate w/ him... (call interpretor line at [phone] to set this up & if he is at home a 3-way conf call can be set up using his home phone [phone]) Alternatively-- pt can call his daughter on cell phone & she can translate...    Outpatient Encounter Medications as of 09/04/2017  Medication Sig  . Albuterol  Sulfate (PROAIR RESPICLICK) 818 (90 Base) MCG/ACT AEPB Inhale 2 puffs into the lungs every 6 (six) hours as needed.  Marland Kitchen atorvastatin (LIPITOR) 10 MG tablet Take 1 tablet (10 mg total) by mouth daily.  . budesonide-formoterol (SYMBICORT) 160-4.5 MCG/ACT inhaler Inhale 2 puffs into the lungs 2 (two) times daily.  . clopidogrel (PLAVIX) 75 MG tablet Take 1 tablet (75 mg total) by mouth daily.  Marland Kitchen dexamethasone (DECADRON) 4 MG tablet Take 2 tablets (8 mg total) by mouth daily. Start the day after chemotherapy for 2 days. Take with food.  . dronabinol (MARINOL) 2.5 MG capsule Take 1 capsule (2.5 mg total) by mouth 2 (two) times daily before a meal.  . lidocaine-prilocaine (EMLA) cream Apply to affected area once  . loperamide (IMODIUM A-D) 2 MG tablet Take 2 mg by mouth 4 (four) times daily as needed for diarrhea or loose stools.  Marland Kitchen LORazepam (ATIVAN) 0.5 MG tablet Take 1 tablet (0.5 mg total) by mouth every 6 (six) hours as needed (Nausea or vomiting).  . methylPREDNISolone (MEDROL DOSEPAK)  4 MG TBPK tablet Take as directed  . mirtazapine (REMERON) 7.5 MG tablet Take 1 tablet (7.5 mg total) by mouth at bedtime.  Marland Kitchen nystatin (MYCOSTATIN) 100000 UNIT/ML suspension Take 5 mLs (500,000 Units total) by mouth 4 (four) times daily for 7 days.  . ondansetron (ZOFRAN) 8 MG tablet Take 1 tablet (8 mg total) by mouth 2 (two) times daily as needed for refractory nausea / vomiting. Start on day 3 after chemotherapy.  Marland Kitchen oxyCODONE-acetaminophen (ROXICET) 5-325 MG tablet Take 1 tablet by mouth every 6 (six) hours as needed for severe pain.  Marland Kitchen prochlorperazine (COMPAZINE) 10 MG tablet Take 1 tablet (10 mg total) by mouth every 6 (six) hours as needed (Nausea or vomiting).  . Tiotropium Bromide Monohydrate (SPIRIVA RESPIMAT) 2.5 MCG/ACT AERS Inhale 2 puffs into the lungs daily.  . traMADol (ULTRAM) 50 MG tablet Take 1 tablet (50 mg total) by mouth every 6 (six) hours as needed.  . [DISCONTINUED] Albuterol Sulfate (PROAIR  RESPICLICK) 017 (90 Base) MCG/ACT AEPB Inhale 2 puffs into the lungs every 6 (six) hours as needed.  . [DISCONTINUED] azithromycin (ZITHROMAX Z-PAK) 250 MG tablet Take as directed  . [DISCONTINUED] budesonide-formoterol (SYMBICORT) 160-4.5 MCG/ACT inhaler Inhale 2 puffs into the lungs 2 (two) times daily.  . [DISCONTINUED] budesonide-formoterol (SYMBICORT) 160-4.5 MCG/ACT inhaler Inhale 2 puffs into the lungs 2 (two) times daily.  . [DISCONTINUED] Tiotropium Bromide Monohydrate (SPIRIVA RESPIMAT) 2.5 MCG/ACT AERS Inhale 2 puffs into the lungs daily. (Patient taking differently: Inhale 1 puff into the lungs daily. )   No facility-administered encounter medications on file as of 09/04/2017.     No Known Allergies   Current Medications, Allergies, Past Medical History, Past Surgical History, Family History, and Social History were reviewed in [employer] record.   Review of Systems            All symptoms NEG except where BOLDED >>  Constitutional:  F/C/S, fatigue, anorexia, unexpected weight change. HEENT:  HA, visual changes, hearing loss, earache, nasal symptoms, sore throat, mouth sores, hoarseness. Resp:  cough, sputum, hemoptysis; SOB, tightness, wheezing. Cardio:  CP, palpit, DOE, orthopnea, edema; leg weakness & pain w/ ambulation GI:  N/V/D/C, blood in stool; reflux, abd pain, distention, gas. GU:  dysuria, freq, urgency, hematuria, flank pain, voiding difficulty. MS:  joint pain, swelling, tenderness, decr ROM; neck pain, back pain, etc. Neuro:  HA, tremors, seizures, dizziness, syncope, weakness, numbness, gait abn. Skin:  suspicious lesions or skin rash. Heme:  adenopathy, bruising, bleeding. Psyche:  confusion, agitation, sleep disturbance, hallucinations, anxiety, depression suicidal.   Objective:   Physical Exam      Vital Signs:  Reviewed...   General:  WD, cachectic, 62 y/o WM in NAD; alert & oriented; pleasant & cooperative... HEENT:  [place]/AT;  Conjunctiva- pink, Sclera- nonicteric, EOM-wnl, PERRLA, EACs-clear, TMs-wnl; NOSE-clear; THROAT-clear & wnl.  Neck:  Supple w/ fair ROM; no JVD; normal carotid impulses w/o bruits; no thyromegaly or nodules palpated; no lymphadenopathy.  Chest:  Decr BS bilat, unable to augment BS voluntarily, clear without wheezes or rales; +congested cough/ rhonchi bilat Heart:  Epig PMI, Regular Rhythm; norm S1 & S2 without murmurs, rubs, or gallops detected. Abdomen:  Thin, soft & nontender- no guarding or rebound; normal bowel sounds; no organomegaly or masses palpated; left abd/ groin bruit Ext:  Normal ROM; without deformities or arthritic changes; no varicose veins, venous insuffic, or edema; LE pulses absent Neuro:  No focal neuro deficits; sensory testing normal; gait normal & balance  OK. Derm:  No lesions noted; no rash etc. Lymph:  No cervical, supraclavicular, axillary, or inguinal adenopathy palpated.   Assessment:      IMP >>   Severe COPD/ Emphysema> on Symbicort160-2spBid & Spiriva Respimat-2sp once daily, Mucinex and AlbutHFA rescue inhaler as needed;  SOB w/ ADLs at baseline...    Bilat small pulm nodules on CT chest 02/2015 + centrilobular emphysema>  Serial CT scans w/o change in small bilat pulm nodules 2016-2017-2018  Cigarette smoker> He has been smoking regularly since age 30, est 80+pack-yr smoking hx, he is proud that he has decr fro 2.5ppd to 1ppd & he promises to cut to 10cig/d...    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  Weight loss/ underweight/ poor appetite> He says his norm wt was ~150# and he has lost down to 100#; we reviewed nutrition, advised Ensure/ Sustacal/ Boost  daily...  PLAN >>  02/16/15>  Bobby Caldwell has been a life long smoker & has paid a price for this behavior w/ severe emphysema; despite still working at [employer] w/ 4 daughters to support- he continues to smoke 1ppd @$5 per pack= #150/mo & I have appealed to his parental instincts to quit smoking completely but he is not optimistic that he will be able to do so & unwilling to purchase smoking cessation aides like Chantix, Nicotine replacement, etc... In addition to smoking cessation we have prescribed SYMBICORT160- 2spBid, & SPIRIVA daily, along w/ ProairHFA rescue inhaler for prn use (coupons given taking advantage of all known discount programs)... For completeness I have rec a CT Chest (esp in light of his wt loss) and his PCP may want to consider CT Abd & Pelvis as well... He is rec to start eating better, 3 meals/day, plus nutritional supplements Bid;  Finally he needs the full benefits for avail immunization programs=> Prevnar-13, Pneumovax-23, Flu shots, etc (we will leave this to his PCP to administer)...  03/30/15>  Bobby Caldwell is improved on the inhalers and we provided him w/ some samples and Rx but it is unclear if he will be able to afford them- discount cards & drug company programs have been provided...  08/02/15>   Pt has severe COPD/emphysema & still smoking 1ppd- must quit all smoking & take his Symbicort/ Spiriva regularly & AlbutHFA as needed; He can use MUCINEX600- 2Bid w/ fluids for thick phlegm. We plan ROV 78mo& will plan f/u CTChest at that time 01/10/16>   He must quit all smoking; f/u CT Chest is pending; continue Symbicort/ Spiriva/ Proair... 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) and is still smoking w/ a 70-80 pack yr hx, proud of the fact that he has cut back by a few cigarettes, refuses Chantix etc;  Asked to quit completely ASAP (says he'll try to decr to 10 cig/d) & continue resp treatments w/ Symbicort160-2spBid, Spiriva Respimat-2sp daily, Mucinex & Proair;   He is most concerned about his progressive leg symptoms=> clearly related to his severe peripheral vasc dis (ectatic distal Ao w/ mural thrombus, bilat common iliac dis, etc) and he is in need of ASAP eval by [facility] where arteriography will hopefully point to bypass surg that will help his symptoms;  In  addition his weight loss & prot-cal malnutrition is alarming- thankfully his labs are essentially wnl & we reviewed nutritional supplements w/ ensure/ sustacal/ carnation instant/ etc...       Plan:     Patient's Medications  New Prescriptions   No medications on file  Previous Medications   ATORVASTATIN (LIPITOR) 10 MG TABLET    Take 1 tablet (10 mg total) by mouth daily.   CLOPIDOGREL (PLAVIX) 75 MG TABLET    Take 1 tablet (75 mg total) by mouth daily.   DEXAMETHASONE (DECADRON) 4 MG TABLET    Take 2 tablets (8 mg total) by mouth daily. Start the day after chemotherapy for 2 days. Take with food.   DRONABINOL (MARINOL) 2.5 MG CAPSULE    Take 1 capsule (2.5 mg total) by mouth 2 (two) times daily before a meal.   LIDOCAINE-PRILOCAINE (EMLA) CREAM    Apply to affected area once   LOPERAMIDE (IMODIUM A-D) 2 MG TABLET    Take 2 mg by mouth 4 (four) times daily as needed for diarrhea or loose stools.   LORAZEPAM (ATIVAN) 0.5 MG TABLET    Take 1 tablet (0.5 mg total) by mouth every 6 (six) hours as needed (Nausea or vomiting).   METHYLPREDNISOLONE (MEDROL DOSEPAK) 4 MG TBPK TABLET    Take as directed   MIRTAZAPINE (REMERON) 7.5 MG TABLET    Take 1 tablet (7.5 mg total) by mouth at bedtime.   NYSTATIN (MYCOSTATIN) 100000 UNIT/ML SUSPENSION    Take 5 mLs (500,000 Units total) by mouth 4 (four) times daily for 7 days.   ONDANSETRON (ZOFRAN) 8 MG TABLET    Take 1 tablet (8 mg total) by mouth 2 (two) times daily as needed for refractory nausea / vomiting. Start on day 3 after chemotherapy.   OXYCODONE-ACETAMINOPHEN (ROXICET) 5-325 MG TABLET    Take 1 tablet by mouth every 6 (six) hours as needed for severe  pain.   PROCHLORPERAZINE (COMPAZINE) 10 MG TABLET    Take 1 tablet (10 mg total) by mouth every 6 (six) hours as needed (Nausea or vomiting).   TRAMADOL (ULTRAM) 50 MG TABLET    Take 1 tablet (50 mg total) by mouth every 6 (six) hours as needed.  Modified Medications   Modified Medication Previous Medication   ALBUTEROL SULFATE (PROAIR RESPICLICK) 211 (90 BASE) MCG/ACT AEPB Albuterol Sulfate (PROAIR RESPICLICK) 941 (90 Base) MCG/ACT AEPB      Inhale 2 puffs into the lungs every 6 (six) hours as needed.    Inhale 2 puffs into the lungs every 6 (six) hours as needed.   BUDESONIDE-FORMOTEROL (SYMBICORT) 160-4.5 MCG/ACT INHALER budesonide-formoterol (SYMBICORT) 160-4.5 MCG/ACT inhaler      Inhale 2 puffs into the lungs 2 (two) times daily.    Inhale 2 puffs into the lungs 2 (two) times daily.   TIOTROPIUM BROMIDE MONOHYDRATE (SPIRIVA RESPIMAT) 2.5 MCG/ACT AERS Tiotropium Bromide Monohydrate (SPIRIVA RESPIMAT) 2.5 MCG/ACT AERS      Inhale 2 puffs into the lungs daily.    Inhale 2 puffs into the lungs daily.  Discontinued Medications   AZITHROMYCIN (ZITHROMAX Z-PAK) 250 MG TABLET    Take as directed

## 2017-09-06 ENCOUNTER — Inpatient Hospital Stay: Payer: BLUE CROSS/BLUE SHIELD

## 2017-09-06 ENCOUNTER — Ambulatory Visit (HOSPITAL_BASED_OUTPATIENT_CLINIC_OR_DEPARTMENT_OTHER): Payer: BLUE CROSS/BLUE SHIELD | Admitting: Medical

## 2017-09-06 VITALS — BP 98/65 | HR 96 | Temp 98.1°F | Resp 24

## 2017-09-06 DIAGNOSIS — C160 Malignant neoplasm of cardia: Secondary | ICD-10-CM

## 2017-09-06 DIAGNOSIS — C169 Malignant neoplasm of stomach, unspecified: Secondary | ICD-10-CM | POA: Diagnosis not present

## 2017-09-06 DIAGNOSIS — J449 Chronic obstructive pulmonary disease, unspecified: Secondary | ICD-10-CM | POA: Diagnosis not present

## 2017-09-06 DIAGNOSIS — Z9981 Dependence on supplemental oxygen: Secondary | ICD-10-CM | POA: Diagnosis not present

## 2017-09-06 DIAGNOSIS — C162 Malignant neoplasm of body of stomach: Secondary | ICD-10-CM

## 2017-09-06 DIAGNOSIS — R0902 Hypoxemia: Secondary | ICD-10-CM | POA: Diagnosis not present

## 2017-09-06 MED ORDER — PALONOSETRON HCL INJECTION 0.25 MG/5ML
0.2500 mg | Freq: Once | INTRAVENOUS | Status: AC
  Administered 2017-09-06: 0.25 mg via INTRAVENOUS

## 2017-09-06 MED ORDER — OXALIPLATIN CHEMO INJECTION 100 MG/20ML
70.0000 mg/m2 | Freq: Once | INTRAVENOUS | Status: AC
  Administered 2017-09-06: 110 mg via INTRAVENOUS
  Filled 2017-09-06: qty 22

## 2017-09-06 MED ORDER — SODIUM CHLORIDE 0.9 % IV SOLN
Freq: Once | INTRAVENOUS | Status: AC
  Administered 2017-09-06: 12:00:00 via INTRAVENOUS
  Filled 2017-09-06: qty 5

## 2017-09-06 MED ORDER — FLUOROURACIL CHEMO INJECTION 5 GM/100ML
2400.0000 mg/m2 | INTRAVENOUS | Status: DC
  Administered 2017-09-06: 3700 mg via INTRAVENOUS
  Filled 2017-09-06: qty 74

## 2017-09-06 MED ORDER — DEXTROSE 5 % IV SOLN
400.0000 mg/m2 | Freq: Once | INTRAVENOUS | Status: AC
  Administered 2017-09-06: 616 mg via INTRAVENOUS
  Filled 2017-09-06: qty 30.8

## 2017-09-06 MED ORDER — SODIUM CHLORIDE 0.9 % IV SOLN
INTRAVENOUS | Status: DC
  Administered 2017-09-06: 13:00:00 via INTRAVENOUS

## 2017-09-06 MED ORDER — PALONOSETRON HCL INJECTION 0.25 MG/5ML
INTRAVENOUS | Status: AC
  Filled 2017-09-06: qty 5

## 2017-09-06 MED ORDER — DEXTROSE 5 % IV SOLN
Freq: Once | INTRAVENOUS | Status: AC
  Administered 2017-09-06: 12:00:00 via INTRAVENOUS

## 2017-09-06 NOTE — Progress Notes (Signed)
1315 Patient reported he needed to use the restroom. While writer was unplugging IV pole, patient was attempting to stand and fell into Probation officer. Writer pushed patient back into chair. Patient was not responsive. Infusions stopped. Normal saline hung wide open. VS checked. See flowsheet. Lucianne Lei, PA called to assess. Patient regained consciousness. Oxygen was applied at 8L nasal canula. Patient quickly stated he was feeling "100%". Vital signs monitored- see flowsheet.   1325 Infusion was restarted. Patient remained on O2 8L nasal canula.   1330 Urinal was provided to patient.   1415 Patient turned O2 down to 4 stating it was "irritating his nose" Saturation checked- 96%  1526 Patient discharged with instruction to apply home O2 as soon as he gets home. Wife verbalized an understanding.

## 2017-09-06 NOTE — Patient Instructions (Signed)
[facility] Discharge Instructions for Patients Receiving Chemotherapy  Today you received the following chemotherapy agents Oxaliplatin, Leucovorin and Adrucil   To help prevent nausea and vomiting after your treatment, we encourage you to take your nausea medication as directed.    If you develop nausea and vomiting that is not controlled by your nausea medication, call the clinic.   BELOW ARE SYMPTOMS THAT SHOULD BE REPORTED IMMEDIATELY:  *FEVER GREATER THAN 100.5 F  *CHILLS WITH OR WITHOUT FEVER  NAUSEA AND VOMITING THAT IS NOT CONTROLLED WITH YOUR NAUSEA MEDICATION  *UNUSUAL SHORTNESS OF BREATH  *UNUSUAL BRUISING OR BLEEDING  TENDERNESS IN MOUTH AND THROAT WITH OR WITHOUT PRESENCE OF ULCERS  *URINARY PROBLEMS  *BOWEL PROBLEMS  UNUSUAL RASH Items with * indicate a potential emergency and should be followed up as soon as possible.  Feel free to call the clinic should you have any questions or concerns. The clinic phone number is [phone].  Please show the CHEMO ALERT CARD at check-in to the Emergency Department and triage nurse.   

## 2017-09-06 NOTE — Progress Notes (Signed)
[facility] Progress Note   Cheyne Boulden [id number] [birth year] 62 y.o.  Bobby Caldwell is managed by Dr. 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  The patient was noted to desaturate.  His oxygen level at the time that he was seen by this author was at 79%.  He was seen by Dr. Teressa Lower who is his pulmonologist yesterday.  Dr. Lenna Gilford put the patient on home O2 with instructions to use it 24 hours a day for his COPD.  The patient was given a portable tank which he did not bring today.  The patient was given an albuterol nebulizer and was placed on supplemental oxygen with his symptoms resolving.  His oxygen saturation returned to 100%.  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  Please see After Visit Summary for patient specific instructions.  Future Appointments  Date Time Provider [place]  09/08/2017 11:00 AM [facility] INJ NURSE [facility] None  09/12/2017  8:00 AM [facility] VASC 1 [facility]  09/12/2017  9:00 AM [facility] VASC 1 [facility]  09/12/2017  9:30 AM Conrad Richland, MD [facility]  09/17/2017  8:15 AM [facility] LAB 3 [facility] None  09/17/2017  8:30 AM [facility] FLUSH NURSE 2 [facility] None  09/17/2017  9:00 AM [name], MD [facility] None  09/17/2017 10:00 AM [facility] C8 [facility] None  09/19/2017 12:30 PM [facility] FLUSH NURSE 2 [facility] None  10/01/2017  9:45 AM [facility] LAB 6 [facility] None  10/01/2017 10:00 AM [facility] INJ NURSE [facility] None  10/01/2017 10:30 AM Alla Feeling, NP [facility] None  10/01/2017 11:30 AM [facility] B7  [facility] None  10/03/2017  2:30 PM [facility] FLUSH NURSE 2 [facility] None  12/04/2017 10:00 AM [name], MD [facility] None    No orders of the defined types were placed in this encounter.      Subjective:   Patient ID:  Bobby Caldwell is a 62 y.o. (DOB 29-Oct-1955) male.  Chief Complaint: No chief complaint on file.   HPI Bobby Caldwell is a 62 year old male with a history of a stage I gastric adenocarcinoma.  Patient also has a history of COPD.  He is seen by Dr. Teressa Lower who is his pulmonologist.  The patient was put on supplemental oxygen yesterday.  He was given a portable tank to use.  The patient states that he did not bring his tank today as he did not feel it was necessary.  The patient was receiving chemotherapy of 5-FU, leucovorin, and oxalic platinum today.  He had fallen asleep, then was getting up to go to the bathroom when he was noted to have desaturation of his oxygen to the upper 70s.  The patient reported that he felt as though he were going to black out.  The patient was helped back to his chair had his supplemental oxygen increased to 8 L via nasal cannula.  The patient's oxygen saturation returned to around 100%.  His symptoms resolved.  He denied chest discomfort, chest tightness, dizziness, or visual changes.  Medications: I have reviewed the patient's current medications.  Allergies: No  Known Allergies  Past Medical History:  Diagnosis Date  . COPD (chronic obstructive pulmonary disease) ([place])   . Dyspnea   . PAD (peripheral artery disease) ([place])   . Stomach cancer ([place])   . Tobacco abuse   . Weight loss     Past Surgical History:  Procedure Laterality Date  . ABDOMINAL AORTOGRAM W/LOWER EXTREMITY N/A 05/03/2017   Procedure: ABDOMINAL AORTOGRAM W/LOWER EXTREMITY;  Surgeon: Conrad [place], MD;  Location: [place] [facility];  Service: Cardiovascular;  Laterality: N/A;  . ESOPHAGOGASTRODUODENOSCOPY     05/24/17  . ESOPHAGOGASTRODUODENOSCOPY (EGD)  WITH PROPOFOL N/A 05/24/2017   Procedure: ESOPHAGOGASTRODUODENOSCOPY (EGD) WITH PROPOFOL;  Surgeon: [name], MD;  Location: [facility];  Service: Endoscopy;  Laterality: N/A;  . EUS N/A 06/07/2017   Procedure: UPPER ENDOSCOPIC ULTRASOUND (EUS) RADIAL;  Surgeon: [name], MD;  Location: [facility];  Service: Endoscopy;  Laterality: N/A;  . FEMORAL-FEMORAL BYPASS GRAFT Bilateral 05/08/2017   Procedure: LEFT TO RIGHT BYPASS GRAFT FEMORAL-FEMORAL ARTERY;  Surgeon: Conrad Tokeland, MD;  Location: [facility];  Service: Vascular;  Laterality: Bilateral;  . IR FLUORO GUIDE PORT INSERTION RIGHT  07/05/2017  . IR US GUIDE VASC ACCESS RIGHT  07/05/2017  . NM MYOVIEW LTD  06/2017   LOW RISK. Small basal inferoseptal defect thought to be related to artifact (although cannot exclude prior infarct). Normal wall motion i in this area would suggest artifact not infarct. Otherwise no ischemia or infarction.  Marland Kitchen PERIPHERAL VASCULAR INTERVENTION Left 05/03/2017   Procedure: PERIPHERAL VASCULAR INTERVENTION;  Surgeon: Conrad Thunderbolt, MD;  Location: [facility];  Service: Cardiovascular;  Laterality: Left;  common iliac  . TRANSTHORACIC ECHOCARDIOGRAM  06/26/2017   Normal LV size and function. EF 60-65%. No wall motion abnormalities. GR 1 DD. Mild LA dilation    Family History  Family history unknown: Yes    Social History   Socioeconomic History  . Marital status: Married    Spouse name: Not on file  . Number of children: 4  . Years of education: Not on file  . Highest education level: Not on file  Social Needs  . Financial resource strain: Not on file  . Food insecurity - worry: Not on file  . Food insecurity - inability: Not on file  . Transportation needs - medical: Not on file  . Transportation needs - non-medical: Not on file  Occupational History  . Not on file  Tobacco Use  . Smoking status: Current Every Day Smoker    Packs/day: 1.00    Years: 50.00    Pack years: 50.00      Types: Cigarettes  . Smokeless tobacco: Never Used  . Tobacco comment: 10 cigarettes per day  Substance and Sexual Activity  . Alcohol use: No    Alcohol/week: 0.0 oz  . Drug use: No  . Sexual activity: Yes  Other Topics Concern  . Not on file  Social History Narrative  . Not on file    Past Medical History, Surgical history, Social history, and Family history were reviewed and updated as appropriate.   Please see review of systems for further details on the patient's review from today.   Review of Systems:  Review of Systems  Constitutional: Negative for diaphoresis.  HENT: Negative for trouble swallowing.   Respiratory: Positive for shortness of breath. Negative for cough, choking and chest tightness.   Cardiovascular: Negative for chest pain and palpitations.  Neurological: Positive for dizziness and light-headedness.  Objective:   Physical Exam:  There were no vitals taken for this visit. ECOG: 1  Physical Exam  Constitutional: No distress.  HENT:  Head: Normocephalic and atraumatic.  Cardiovascular: Normal rate, regular rhythm and normal heart sounds. Exam reveals no gallop and no friction rub.  No murmur heard. Pulmonary/Chest: No respiratory distress. He has wheezes. He has no rales.  Neurological: He is alert.  Skin: Skin is warm and dry. No rash noted. He is not diaphoretic. No erythema.  Psychiatric: He has a normal mood and affect. His behavior is normal. Judgment and thought content normal.    Lab Review:     Component Value Date/Time   NA 136 09/03/2017 0810   NA 139 07/11/2017 0848   K 3.7 09/03/2017 0810   K 3.5 07/11/2017 0848   CL 98 09/03/2017 0810   CO2 30 (H) 09/03/2017 0810   CO2 32 (H) 07/11/2017 0848   GLUCOSE 97 09/03/2017 0810   GLUCOSE 109 07/11/2017 0848   BUN 9 09/03/2017 0810   BUN 9.8 07/11/2017 0848   CREATININE 0.56 (L) 09/03/2017 0810   CREATININE 0.7 07/11/2017 0848   CALCIUM 8.7 09/03/2017 0810   CALCIUM 9.0  07/11/2017 0848   PROT 6.2 (L) 09/03/2017 0810   PROT 7.1 07/11/2017 0848   ALBUMIN 2.6 (L) 09/03/2017 0810   ALBUMIN 3.6 07/11/2017 0848   AST 16 09/03/2017 0810   AST 15 07/11/2017 0848   ALT 25 09/03/2017 0810   ALT 17 07/11/2017 0848   ALKPHOS 101 09/03/2017 0810   ALKPHOS 116 07/11/2017 0848   BILITOT 0.3 09/03/2017 0810   BILITOT 0.54 07/11/2017 0848   GFRNONAA >60 09/03/2017 0810   GFRAA >60 09/03/2017 0810       Component Value Date/Time   WBC 9.8 09/03/2017 0810   RBC 4.79 09/03/2017 0810   HGB 13.6 09/03/2017 0810   HGB 15.0 07/11/2017 0848   HCT 41.0 09/03/2017 0810   HCT 43.7 07/11/2017 0848   PLT 286 09/03/2017 0810   PLT 165 07/11/2017 0848   MCV 85.5 09/03/2017 0810   MCV 86.7 07/11/2017 0848   MCH 28.4 09/03/2017 0810   MCHC 33.2 09/03/2017 0810   RDW 15.8 (H) 09/03/2017 0810   RDW 12.8 07/11/2017 0848   LYMPHSABS 1.4 09/03/2017 0810   LYMPHSABS 1.4 07/11/2017 0848   MONOABS 0.8 09/03/2017 0810   MONOABS 0.6 07/11/2017 0848   EOSABS 0.1 09/03/2017 0810   EOSABS 0.2 07/11/2017 0848   BASOSABS 0.1 09/03/2017 0810   BASOSABS 0.1 07/11/2017 0848   -------------------------------  Imaging from last 24 hours (if applicable):  Radiology interpretation: No results found.      This case was discussed with Dr. Burr Medico. She expressed agreement with my management of this patient.

## 2017-09-08 ENCOUNTER — Inpatient Hospital Stay: Payer: BLUE CROSS/BLUE SHIELD | Attending: Hematology

## 2017-09-08 VITALS — BP 128/85 | HR 118 | Temp 97.8°F | Resp 20

## 2017-09-08 DIAGNOSIS — C160 Malignant neoplasm of cardia: Secondary | ICD-10-CM | POA: Insufficient documentation

## 2017-09-08 DIAGNOSIS — C162 Malignant neoplasm of body of stomach: Secondary | ICD-10-CM

## 2017-09-08 DIAGNOSIS — Z95828 Presence of other vascular implants and grafts: Secondary | ICD-10-CM | POA: Insufficient documentation

## 2017-09-08 DIAGNOSIS — J449 Chronic obstructive pulmonary disease, unspecified: Secondary | ICD-10-CM | POA: Insufficient documentation

## 2017-09-08 DIAGNOSIS — F17210 Nicotine dependence, cigarettes, uncomplicated: Secondary | ICD-10-CM | POA: Insufficient documentation

## 2017-09-08 MED ORDER — HEPARIN SOD (PORK) LOCK FLUSH 100 UNIT/ML IV SOLN
500.0000 [IU] | Freq: Once | INTRAVENOUS | Status: AC | PRN
  Administered 2017-09-08: 500 [IU]
  Filled 2017-09-08: qty 5

## 2017-09-08 MED ORDER — SODIUM CHLORIDE 0.9% FLUSH
10.0000 mL | INTRAVENOUS | Status: DC | PRN
  Administered 2017-09-08: 10 mL
  Filled 2017-09-08: qty 10

## 2017-09-08 NOTE — Progress Notes (Signed)
Pt came in today for pump D/C upon arrival vital signs were taken. Pt.'s oxygen level was 80 percent and Pt. Carrying oxygen tank. Pt.'s oxygen  tank was empty, Pt. Placed on 2L of oxygen. Pt was a little early for pump D/C. Pt agreed to sit for awhile, while getting some oxygen. Pt stated he had another oxygen tank at home, asked if his wife could go get the other tank, he stated that he was the only one that drives. After waiting about an hour Pt. Stated he wanted to leave and pump was D/C'd. Pt stated he was going straight home to get other oxygen tank. Follow up call to home, Spoke with Pt. He is home and on his oxygen. Kelli Hope LPN

## 2017-09-10 ENCOUNTER — Emergency Department (HOSPITAL_COMMUNITY): Payer: BLUE CROSS/BLUE SHIELD

## 2017-09-10 ENCOUNTER — Emergency Department (HOSPITAL_BASED_OUTPATIENT_CLINIC_OR_DEPARTMENT_OTHER)
Admit: 2017-09-10 | Discharge: 2017-09-10 | Disposition: A | Discharge status: Home / Self Care | Payer: BLUE CROSS/BLUE SHIELD | Attending: Emergency Medicine | Admitting: Emergency Medicine

## 2017-09-10 ENCOUNTER — Other Ambulatory Visit: Payer: Self-pay

## 2017-09-10 ENCOUNTER — Inpatient Hospital Stay (HOSPITAL_COMMUNITY)
Admission: EM | Admit: 2017-09-10 | Discharge: 2017-09-12 | DRG: 193 | Disposition: A | Discharge status: Home Health | Payer: BLUE CROSS/BLUE SHIELD | Attending: Internal Medicine | Admitting: Internal Medicine

## 2017-09-10 DIAGNOSIS — R0602 Shortness of breath: Secondary | ICD-10-CM | POA: Diagnosis not present

## 2017-09-10 DIAGNOSIS — F17210 Nicotine dependence, cigarettes, uncomplicated: Secondary | ICD-10-CM | POA: Diagnosis present

## 2017-09-10 DIAGNOSIS — C160 Malignant neoplasm of cardia: Secondary | ICD-10-CM | POA: Diagnosis not present

## 2017-09-10 DIAGNOSIS — Z79899 Other long term (current) drug therapy: Secondary | ICD-10-CM | POA: Diagnosis not present

## 2017-09-10 DIAGNOSIS — J441 Chronic obstructive pulmonary disease with (acute) exacerbation: Secondary | ICD-10-CM | POA: Diagnosis present

## 2017-09-10 DIAGNOSIS — R1310 Dysphagia, unspecified: Secondary | ICD-10-CM | POA: Diagnosis present

## 2017-09-10 DIAGNOSIS — J9622 Acute and chronic respiratory failure with hypercapnia: Secondary | ICD-10-CM

## 2017-09-10 DIAGNOSIS — I739 Peripheral vascular disease, unspecified: Secondary | ICD-10-CM | POA: Diagnosis present

## 2017-09-10 DIAGNOSIS — J432 Centrilobular emphysema: Secondary | ICD-10-CM

## 2017-09-10 DIAGNOSIS — J189 Pneumonia, unspecified organism: Secondary | ICD-10-CM | POA: Diagnosis not present

## 2017-09-10 DIAGNOSIS — E872 Acidosis: Secondary | ICD-10-CM | POA: Diagnosis present

## 2017-09-10 DIAGNOSIS — B370 Candidal stomatitis: Secondary | ICD-10-CM

## 2017-09-10 DIAGNOSIS — R0609 Other forms of dyspnea: Secondary | ICD-10-CM

## 2017-09-10 DIAGNOSIS — J181 Lobar pneumonia, unspecified organism: Principal | ICD-10-CM | POA: Diagnosis present

## 2017-09-10 DIAGNOSIS — E876 Hypokalemia: Secondary | ICD-10-CM | POA: Diagnosis present

## 2017-09-10 DIAGNOSIS — J439 Emphysema, unspecified: Secondary | ICD-10-CM | POA: Diagnosis present

## 2017-09-10 DIAGNOSIS — J9621 Acute and chronic respiratory failure with hypoxia: Secondary | ICD-10-CM | POA: Diagnosis present

## 2017-09-10 DIAGNOSIS — J440 Chronic obstructive pulmonary disease with acute lower respiratory infection: Secondary | ICD-10-CM | POA: Diagnosis present

## 2017-09-10 DIAGNOSIS — Z9981 Dependence on supplemental oxygen: Secondary | ICD-10-CM

## 2017-09-10 DIAGNOSIS — M7989 Other specified soft tissue disorders: Secondary | ICD-10-CM | POA: Diagnosis not present

## 2017-09-10 DIAGNOSIS — Z9221 Personal history of antineoplastic chemotherapy: Secondary | ICD-10-CM

## 2017-09-10 DIAGNOSIS — I70213 Atherosclerosis of native arteries of extremities with intermittent claudication, bilateral legs: Secondary | ICD-10-CM

## 2017-09-10 DIAGNOSIS — Z681 Body mass index (BMI) 19 or less, adult: Secondary | ICD-10-CM | POA: Diagnosis not present

## 2017-09-10 DIAGNOSIS — E43 Unspecified severe protein-calorie malnutrition: Secondary | ICD-10-CM | POA: Diagnosis present

## 2017-09-10 DIAGNOSIS — Z95828 Presence of other vascular implants and grafts: Secondary | ICD-10-CM

## 2017-09-10 DIAGNOSIS — I70219 Atherosclerosis of native arteries of extremities with intermittent claudication, unspecified extremity: Secondary | ICD-10-CM | POA: Diagnosis present

## 2017-09-10 DIAGNOSIS — C169 Malignant neoplasm of stomach, unspecified: Secondary | ICD-10-CM | POA: Diagnosis present

## 2017-09-10 LAB — CBC WITH DIFFERENTIAL/PLATELET
Basophils Absolute: 0 10*3/uL (ref 0.0–0.1)
Basophils Relative: 0 %
EOS ABS: 0 10*3/uL (ref 0.0–0.7)
Eosinophils Relative: 0 %
HCT: 40.4 % (ref 39.0–52.0)
Hemoglobin: 12.9 g/dL — ABNORMAL LOW (ref 13.0–17.0)
LYMPHS ABS: 1.7 10*3/uL (ref 0.7–4.0)
Lymphocytes Relative: 20 %
MCH: 28.5 pg (ref 26.0–34.0)
MCHC: 31.9 g/dL (ref 30.0–36.0)
MCV: 89.4 fL (ref 78.0–100.0)
MONO ABS: 0.2 10*3/uL (ref 0.1–1.0)
Monocytes Relative: 3 %
Neutro Abs: 6.5 10*3/uL (ref 1.7–7.7)
Neutrophils Relative %: 77 %
PLATELETS: 227 10*3/uL (ref 150–400)
RBC: 4.52 MIL/uL (ref 4.22–5.81)
RDW: 16.1 % — AB (ref 11.5–15.5)
WBC: 8.4 10*3/uL (ref 4.0–10.5)

## 2017-09-10 LAB — I-STAT CG4 LACTIC ACID, ED
LACTIC ACID, VENOUS: 1.41 mmol/L (ref 0.5–1.9)
Lactic Acid, Venous: 2.09 mmol/L (ref 0.5–1.9)

## 2017-09-10 LAB — COMPREHENSIVE METABOLIC PANEL
ALT: 20 U/L (ref 17–63)
AST: 18 U/L (ref 15–41)
Albumin: 2.9 g/dL — ABNORMAL LOW (ref 3.5–5.0)
Alkaline Phosphatase: 73 U/L (ref 38–126)
Anion gap: 9 (ref 5–15)
BILIRUBIN TOTAL: 0.6 mg/dL (ref 0.3–1.2)
BUN: 12 mg/dL (ref 6–20)
CHLORIDE: 99 mmol/L — AB (ref 101–111)
CO2: 34 mmol/L — ABNORMAL HIGH (ref 22–32)
Calcium: 8 mg/dL — ABNORMAL LOW (ref 8.9–10.3)
Creatinine, Ser: 0.48 mg/dL — ABNORMAL LOW (ref 0.61–1.24)
GFR calc Af Amer: >60 mL/min (ref >60)
Glucose, Bld: 145 mg/dL — ABNORMAL HIGH (ref 65–99)
Potassium: 3.4 mmol/L — ABNORMAL LOW (ref 3.5–5.1)
Sodium: 142 mmol/L (ref 135–145)
Total Protein: 5.6 g/dL — ABNORMAL LOW (ref 6.5–8.1)

## 2017-09-10 LAB — URINALYSIS, ROUTINE W REFLEX MICROSCOPIC
BILIRUBIN URINE: NEGATIVE
Glucose, UA: NEGATIVE mg/dL
HGB URINE DIPSTICK: NEGATIVE
Ketones, ur: NEGATIVE mg/dL
Leukocytes, UA: NEGATIVE
Nitrite: NEGATIVE
PROTEIN: NEGATIVE mg/dL
Specific Gravity, Urine: 1.006 (ref 1.005–1.030)
pH: 8 (ref 5.0–8.0)

## 2017-09-10 LAB — INFLUENZA PANEL BY PCR (TYPE A & B)
Influenza A By PCR: NEGATIVE
Influenza B By PCR: NEGATIVE

## 2017-09-10 LAB — D-DIMER, QUANTITATIVE (NOT AT ARMC): D DIMER QUANT: 1.32 ug{FEU}/mL — AB (ref 0.00–0.50)

## 2017-09-10 LAB — BRAIN NATRIURETIC PEPTIDE: B Natriuretic Peptide: 355.3 pg/mL — ABNORMAL HIGH (ref 0.0–100.0)

## 2017-09-10 MED ORDER — POTASSIUM CHLORIDE IN NACL 20-0.9 MEQ/L-% IV SOLN
INTRAVENOUS | Status: DC
  Administered 2017-09-11: 03:00:00 via INTRAVENOUS
  Filled 2017-09-10: qty 1000

## 2017-09-10 MED ORDER — ALBUTEROL SULFATE (2.5 MG/3ML) 0.083% IN NEBU
5.0000 mg | INHALATION_SOLUTION | Freq: Once | RESPIRATORY_TRACT | Status: AC
  Administered 2017-09-10: 5 mg via RESPIRATORY_TRACT
  Filled 2017-09-10: qty 6

## 2017-09-10 MED ORDER — IPRATROPIUM-ALBUTEROL 0.5-2.5 (3) MG/3ML IN SOLN
3.0000 mL | Freq: Three times a day (TID) | RESPIRATORY_TRACT | Status: DC
  Administered 2017-09-11 – 2017-09-12 (×5): 3 mL via RESPIRATORY_TRACT
  Filled 2017-09-10 (×5): qty 3

## 2017-09-10 MED ORDER — LORAZEPAM 0.5 MG PO TABS: 0.5000 mg | ORAL_TABLET | Freq: Four times a day (QID) | ORAL | Status: DC | PRN

## 2017-09-10 MED ORDER — DRONABINOL 2.5 MG PO CAPS
2.5000 mg | ORAL_CAPSULE | Freq: Two times a day (BID) | ORAL | Status: DC
  Administered 2017-09-11 – 2017-09-12 (×3): 2.5 mg via ORAL
  Filled 2017-09-10 (×3): qty 1

## 2017-09-10 MED ORDER — IOPAMIDOL (ISOVUE-300) INJECTION 61%
INTRAVENOUS | Status: AC
  Filled 2017-09-10: qty 100

## 2017-09-10 MED ORDER — CLOPIDOGREL BISULFATE 75 MG PO TABS
75.0000 mg | ORAL_TABLET | Freq: Every day | ORAL | Status: DC
  Administered 2017-09-11 – 2017-09-12 (×2): 75 mg via ORAL
  Filled 2017-09-10 (×2): qty 1

## 2017-09-10 MED ORDER — OXYCODONE-ACETAMINOPHEN 5-325 MG PO TABS: 1.0000 | ORAL_TABLET | Freq: Four times a day (QID) | ORAL | Status: DC | PRN

## 2017-09-10 MED ORDER — PIPERACILLIN-TAZOBACTAM 3.375 G IVPB 30 MIN
3.3750 g | Freq: Once | INTRAVENOUS | Status: AC
  Administered 2017-09-10: 3.375 g via INTRAVENOUS
  Filled 2017-09-10: qty 50

## 2017-09-10 MED ORDER — MOMETASONE FURO-FORMOTEROL FUM 200-5 MCG/ACT IN AERO
2.0000 | INHALATION_SPRAY | Freq: Two times a day (BID) | RESPIRATORY_TRACT | Status: DC
  Administered 2017-09-11 – 2017-09-12 (×3): 2 via RESPIRATORY_TRACT
  Filled 2017-09-10: qty 8.8

## 2017-09-10 MED ORDER — FUROSEMIDE 10 MG/ML IJ SOLN
20.0000 mg | Freq: Once | INTRAMUSCULAR | Status: AC
  Administered 2017-09-10: 20 mg via INTRAVENOUS
  Filled 2017-09-10: qty 4

## 2017-09-10 MED ORDER — ONDANSETRON HCL 4 MG/2ML IJ SOLN: 4.0000 mg | Freq: Four times a day (QID) | INTRAMUSCULAR | Status: DC | PRN

## 2017-09-10 MED ORDER — SODIUM CHLORIDE 0.9 % IV BOLUS (SEPSIS)
1000.0000 mL | Freq: Once | INTRAVENOUS | Status: AC
  Administered 2017-09-10: 1000 mL via INTRAVENOUS

## 2017-09-10 MED ORDER — ATORVASTATIN CALCIUM 10 MG PO TABS
10.0000 mg | ORAL_TABLET | Freq: Every day | ORAL | Status: DC
  Administered 2017-09-11 – 2017-09-12 (×2): 10 mg via ORAL
  Filled 2017-09-10 (×2): qty 1

## 2017-09-10 MED ORDER — ENOXAPARIN SODIUM 30 MG/0.3ML ~~LOC~~ SOLN
30.0000 mg | SUBCUTANEOUS | Status: DC
  Administered 2017-09-11 – 2017-09-12 (×2): 30 mg via SUBCUTANEOUS
  Filled 2017-09-10 (×2): qty 0.3

## 2017-09-10 MED ORDER — IPRATROPIUM-ALBUTEROL 0.5-2.5 (3) MG/3ML IN SOLN: 3.0000 mL | Freq: Four times a day (QID) | RESPIRATORY_TRACT | Status: DC

## 2017-09-10 MED ORDER — FAMOTIDINE IN NACL 20-0.9 MG/50ML-% IV SOLN
20.0000 mg | Freq: Once | INTRAVENOUS | Status: AC
  Administered 2017-09-10: 20 mg via INTRAVENOUS
  Filled 2017-09-10: qty 50

## 2017-09-10 MED ORDER — VANCOMYCIN HCL IN DEXTROSE 1-5 GM/200ML-% IV SOLN
1000.0000 mg | Freq: Once | INTRAVENOUS | Status: AC
  Administered 2017-09-10: 1000 mg via INTRAVENOUS
  Filled 2017-09-10: qty 200

## 2017-09-10 MED ORDER — AZITHROMYCIN 500 MG IV SOLR
500.0000 mg | INTRAVENOUS | Status: DC
  Administered 2017-09-11 (×2): 500 mg via INTRAVENOUS
  Filled 2017-09-10 (×2): qty 500

## 2017-09-10 MED ORDER — SODIUM CHLORIDE 0.9 % IJ SOLN
INTRAMUSCULAR | Status: AC
  Filled 2017-09-10: qty 50

## 2017-09-10 MED ORDER — POTASSIUM CHLORIDE 10 MEQ/100ML IV SOLN: 10.0000 meq | INTRAVENOUS | Status: DC

## 2017-09-10 MED ORDER — NYSTATIN 100000 UNIT/ML MT SUSP
5.0000 mL | Freq: Four times a day (QID) | OROMUCOSAL | Status: DC
  Administered 2017-09-11 – 2017-09-12 (×6): 500000 [IU] via ORAL
  Filled 2017-09-10 (×6): qty 5

## 2017-09-10 MED ORDER — IOPAMIDOL (ISOVUE-370) INJECTION 76%
INTRAVENOUS | Status: AC
  Administered 2017-09-10: 100 mL
  Filled 2017-09-10: qty 100

## 2017-09-10 MED ORDER — SODIUM CHLORIDE 0.9 % IV SOLN
1.0000 g | INTRAVENOUS | Status: DC
  Administered 2017-09-11 (×2): 1 g via INTRAVENOUS
  Filled 2017-09-10 (×3): qty 10

## 2017-09-10 MED ORDER — METHYLPREDNISOLONE SODIUM SUCC 40 MG IJ SOLR
40.0000 mg | Freq: Two times a day (BID) | INTRAMUSCULAR | Status: DC
  Administered 2017-09-11 (×3): 40 mg via INTRAVENOUS
  Filled 2017-09-10 (×3): qty 1

## 2017-09-10 MED ORDER — ONDANSETRON HCL 4 MG/2ML IJ SOLN
4.0000 mg | Freq: Once | INTRAMUSCULAR | Status: AC
  Administered 2017-09-10: 4 mg via INTRAVENOUS
  Filled 2017-09-10: qty 2

## 2017-09-10 NOTE — ED Provider Notes (Signed)
[facility] DEPT Provider Note   CSN: [id number] Arrival date & time: 09/10/17  1124     History   Chief Complaint Chief Complaint  Patient presents with  . Shortness of Breath    HPI Bobby Caldwell is a 62 y.o. male.  Patient is a 62 year old male with a history of COPD, oxygen dependent and adenocarcinoma of the stomach who presents with shortness of breath and fevers.  He reports a 4-day history of worsening shortness of breath.  He reports a cough which is productive of some yellow sputum.  Reports some chills at home and subjective fevers.  He has some nausea and dry heaves.  He does not really have any significant food ingestion.  He does have some abdominal pain which is chronic but he has some worsening pain across his lower abdomen.  He also feels like when he tries to eat something his food gets stuck in the middle of his chest.  He is able to get the food down.  He denies any urinary symptoms.  No diarrhea.  No rashes or sores.  He feels dizzy on standing.  He feels generally weak all over. He notes some recent swelling of both legs.  History is obtained through an [place] audio interpreter.  He is from [place].      Past Medical History:  Diagnosis Date  . COPD (chronic obstructive pulmonary disease) ([place])   . Dyspnea   . PAD (peripheral artery disease) ([place])   . Stomach cancer ([place])   . Tobacco abuse   . Weight loss     Patient Active Problem List   Diagnosis Date Noted  . Exercise hypoxemia 09/04/2017  . Moderate protein-calorie malnutrition ([place]) 07/24/2017  . Port-A-Cath in place 07/06/2017  . DOE (dyspnea on exertion) 06/12/2017  . Atypical chest pain 06/12/2017  . Essential hypertension 06/12/2017  . Preop cardiovascular exam 06/12/2017  . Gastric cancer ([place]) 06/04/2017  . Gastritis and gastroduodenitis   . Gastric mass   . Atherosclerosis of native arteries of extremity with intermittent claudication ([place]) 05/03/2017  .  Critical lower limb ischemia 05/03/2017  . Abnormal CT of the chest 08/02/2015  . Underweight 03/30/2015  . COPD (chronic obstructive pulmonary disease) with emphysema ([place]) 02/16/2015  . Weight loss 02/16/2015  . Cigarette smoker 02/16/2015    Past Surgical History:  Procedure Laterality Date  . ABDOMINAL AORTOGRAM W/LOWER EXTREMITY N/A 05/03/2017   Procedure: ABDOMINAL AORTOGRAM W/LOWER EXTREMITY;  Surgeon: Conrad Mecca, MD;  Location: [facility];  Service: Cardiovascular;  Laterality: N/A;  . ESOPHAGOGASTRODUODENOSCOPY     05/24/17  . ESOPHAGOGASTRODUODENOSCOPY (EGD) WITH PROPOFOL N/A 05/24/2017   Procedure: ESOPHAGOGASTRODUODENOSCOPY (EGD) WITH PROPOFOL;  Surgeon: [name], MD;  Location: [facility];  Service: Endoscopy;  Laterality: N/A;  . EUS N/A 06/07/2017   Procedure: UPPER ENDOSCOPIC ULTRASOUND (EUS) RADIAL;  Surgeon: [name], MD;  Location: [facility];  Service: Endoscopy;  Laterality: N/A;  . FEMORAL-FEMORAL BYPASS GRAFT Bilateral 05/08/2017   Procedure: LEFT TO RIGHT BYPASS GRAFT FEMORAL-FEMORAL ARTERY;  Surgeon: Conrad , MD;  Location: [facility];  Service: Vascular;  Laterality: Bilateral;  . IR FLUORO GUIDE PORT INSERTION RIGHT  07/05/2017  . IR US GUIDE VASC ACCESS RIGHT  07/05/2017  . NM MYOVIEW LTD  06/2017   LOW RISK. Small basal inferoseptal defect thought to be related to artifact (although cannot exclude prior infarct). Normal wall motion i in this area would suggest artifact not infarct. Otherwise  no ischemia or infarction.  Marland Kitchen PERIPHERAL VASCULAR INTERVENTION Left 05/03/2017   Procedure: PERIPHERAL VASCULAR INTERVENTION;  Surgeon: Conrad Westphalia, MD;  Location: [facility];  Service: Cardiovascular;  Laterality: Left;  common iliac  . TRANSTHORACIC ECHOCARDIOGRAM  06/26/2017   Normal LV size and function. EF 60-65%. No wall motion abnormalities. GR 1 DD. Mild LA dilation       Home Medications    Prior to Admission  medications   Medication Sig Start Date End Date Taking? Authorizing Provider  Albuterol Sulfate (PROAIR RESPICLICK) 761 (90 Base) MCG/ACT AEPB Inhale 2 puffs into the lungs every 6 (six) hours as needed. 09/04/17  Yes [name], MD  budesonide-formoterol [facility]) 160-4.5 MCG/ACT inhaler Inhale 2 puffs into the lungs 2 (two) times daily. 09/04/17  Yes [name], MD  dexamethasone (DECADRON) 4 MG tablet Take 2 tablets (8 mg total) by mouth daily. Start the day after chemotherapy for 2 days. Take with food. 07/07/17  Yes [name], MD  dronabinol (MARINOL) 2.5 MG capsule Take 1 capsule (2.5 mg total) by mouth 2 (two) times daily before a meal. 09/03/17  Yes Alla Feeling, NP  lidocaine-prilocaine (EMLA) cream Apply to affected area once Patient taking differently: Apply 1 application topically once as needed (For port-a-cath.).  07/06/17  Yes [name], MD  methylPREDNISolone (MEDROL DOSEPAK) 4 MG TBPK tablet Take as directed 08/20/17  Yes Alla Feeling, NP  nystatin (MYCOSTATIN) 100000 UNIT/ML suspension Take 5 mLs (500,000 Units total) by mouth 4 (four) times daily for 7 days. 09/03/17 09/10/17 Yes Alla Feeling, NP  ondansetron (ZOFRAN) 8 MG tablet Take 1 tablet (8 mg total) by mouth 2 (two) times daily as needed for refractory nausea / vomiting. Start on day 3 after chemotherapy. 07/06/17  Yes [name], MD  prochlorperazine (COMPAZINE) 10 MG tablet Take 1 tablet (10 mg total) by mouth every 6 (six) hours as needed (Nausea or vomiting). 07/06/17  Yes [name], MD  Tiotropium Bromide Monohydrate (SPIRIVA RESPIMAT) 2.5 MCG/ACT AERS Inhale 2 puffs into the lungs daily. 09/04/17  Yes [name], MD  atorvastatin (LIPITOR) 10 MG tablet Take 1 tablet (10 mg total) by mouth daily. Patient not taking: Reported on 09/10/2017 05/08/17   Gabriel Earing, PA-C  clopidogrel (PLAVIX) 75 MG tablet Take 1 tablet (75 mg total) by mouth daily. Patient not taking: Reported on 09/10/2017 05/03/17   Conrad [place], MD  LORazepam (ATIVAN) 0.5 MG tablet Take 1 tablet (0.5 mg total) by mouth every 6 (six) hours as needed (Nausea or vomiting). Patient not taking: Reported on 09/10/2017 07/07/17   [name], MD  mirtazapine (REMERON) 7.5 MG tablet Take 1 tablet (7.5 mg total) by mouth at bedtime. Patient not taking: Reported on 09/10/2017 07/11/17   [name], MD  oxyCODONE-acetaminophen (ROXICET) 5-325 MG tablet Take 1 tablet by mouth every 6 (six) hours as needed for severe pain. Patient not taking: Reported on 09/10/2017 05/08/17   Rhyne, Hulen Shouts, PA-C  traMADol (ULTRAM) 50 MG tablet Take 1 tablet (50 mg total) by mouth every 6 (six) hours as needed. Patient not taking: Reported on 09/10/2017 08/20/17   Alla Feeling, NP    Family History Family History  Family history unknown: Yes    Social History Social History   Tobacco Use  . Smoking status: Current Every Day Smoker    Packs/day: 1.00    Years: 50.00    Pack years: 50.00  Types: Cigarettes  . Smokeless tobacco: Never Used  . Tobacco comment: 10 cigarettes per day  Substance Use Topics  . Alcohol use: No    Alcohol/week: 0.0 oz  . Drug use: No     Allergies   Patient has no known allergies.   Review of Systems Review of Systems  Constitutional: Positive for chills, fatigue and fever. Negative for diaphoresis.  HENT: Negative for congestion, rhinorrhea and sneezing.   Eyes: Negative.   Respiratory: Positive for cough and shortness of breath. Negative for chest tightness.   Cardiovascular: Positive for leg swelling. Negative for chest pain.  Gastrointestinal: Positive for abdominal pain, nausea and vomiting. Negative for blood in stool and diarrhea.  Genitourinary: Negative for difficulty urinating, flank pain, frequency and hematuria.  Musculoskeletal: Negative for arthralgias and back pain.  Skin: Negative for rash.  Neurological: Positive for light-headedness. Negative for dizziness, speech difficulty, weakness, numbness  and headaches.     Physical Exam Updated Vital Signs BP 125/79   Pulse 89   Temp 98.6 F (37 C) (Rectal)   Resp 15   SpO2 98%   Physical Exam  Constitutional: He is oriented to person, place, and time. He appears well-developed and well-nourished.  HENT:  Head: Normocephalic and atraumatic.  Eyes: Pupils are equal, round, and reactive to light.  Neck: Normal range of motion. Neck supple.  Cardiovascular: Normal rate, regular rhythm and normal heart sounds.  Pulmonary/Chest: Effort normal. No respiratory distress. He has decreased breath sounds. He has wheezes. He has no rales. He exhibits no tenderness.  Abdominal: Soft. Bowel sounds are normal. There is tenderness (Diffuse tenderness but more across the lower abdomen). There is no rebound and no guarding.  Musculoskeletal: Normal range of motion.       Right lower leg: He exhibits edema.       Left lower leg: He exhibits edema.  Edema to bilateral lower extremities but more on the left, no warmth or erythema  Lymphadenopathy:    He has no cervical adenopathy.  Neurological: He is alert and oriented to person, place, and time.  Skin: Skin is warm and dry. No rash noted.  Psychiatric: He has a normal mood and affect.     ED Treatments / Results  Labs (all labs ordered are listed, but only abnormal results are displayed) Labs Reviewed  COMPREHENSIVE METABOLIC PANEL - Abnormal; Notable for the following components:      Result Value   Potassium 3.4 (*)    Chloride 99 (*)    CO2 34 (*)    Glucose, Bld 145 (*)    Creatinine, Ser 0.48 (*)    Calcium 8.0 (*)    Total Protein 5.6 (*)    Albumin 2.9 (*)    All other components within normal limits  CBC WITH DIFFERENTIAL/PLATELET - Abnormal; Notable for the following components:   Hemoglobin 12.9 (*)    RDW 16.1 (*)    All other components within normal limits  URINALYSIS, ROUTINE W REFLEX MICROSCOPIC - Abnormal; Notable for the following components:   Color, Urine STRAW  (*)    All other components within normal limits  D-DIMER, QUANTITATIVE (NOT AT [facility]) - Abnormal; Notable for the following components:   D-Dimer, Quant 1.32 (*)    All other components within normal limits  BRAIN NATRIURETIC PEPTIDE - Abnormal; Notable for the following components:   B Natriuretic Peptide 355.3 (*)    All other components within normal limits  I-STAT CG4 LACTIC ACID, ED -  Abnormal; Notable for the following components:   Lactic Acid, Venous 2.09 (*)    All other components within normal limits  CULTURE, BLOOD (ROUTINE X 2)  CULTURE, BLOOD (ROUTINE X 2)  RESPIRATORY PANEL BY PCR  URINE CULTURE  INFLUENZA PANEL BY PCR (TYPE A & B)  I-STAT CG4 LACTIC ACID, ED  I-STAT CG4 LACTIC ACID, ED    EKG  EKG Interpretation  Date/Time:  Monday September 10 2017 12:36:09 EST Ventricular Rate:  94 PR Interval:    QRS Duration: 94 QT Interval:  376 QTC Calculation: 471 R Axis:   48 Text Interpretation:  Sinus rhythm Nonspecific T abnormalities, inferior leads Confirmed by Malvin Johns [phone]) on 09/10/2017 1:43:41 PM       Radiology Dg Chest 1 View  Result Date: 09/10/2017 CLINICAL DATA:  Sensation of food being stuck in the chest. EXAM: CHEST 1 VIEW COMPARISON:  Chest x-ray of May 08, 2017 FINDINGS: The lungs remain hyperinflated. There is no focal infiltrate. There is no pleural effusion. The heart and pulmonary vascularity are normal. The mediastinum is normal in width. The trachea is midline. The porta catheter tip projects over the distal third of the SVC. The bony thorax exhibits no acute abnormality. IMPRESSION: Chronic bronchitic-smoking related changes, stable. No acute pneumonia nor CHF. No mediastinal abnormality is observed. Electronically Signed   By: David  Martinique M.D.   On: 09/10/2017 12:33   Ct Angio Chest Pe W/cm &/or Wo Cm  Result Date: 09/10/2017 CLINICAL DATA:  Gastric cancer, shortness of breath, fever. EXAM: CT ANGIOGRAPHY CHEST WITH CONTRAST TECHNIQUE:  Multidetector CT imaging of the chest was performed using the standard protocol during bolus administration of intravenous contrast. Multiplanar CT image reconstructions and MIPs were obtained to evaluate the vascular anatomy. CONTRAST:  131mL ISOVUE-370 IOPAMIDOL (ISOVUE-370) INJECTION 76% COMPARISON:  10/21/2016. FINDINGS: Cardiovascular: Negative for pulmonary embolus. Right IJ Port-A-Cath terminates at the SVC RA junction. Heart size normal. No pericardial effusion. Mediastinum/Nodes: No pathologically enlarged mediastinal, hilar or axillary lymph nodes. Esophagus is grossly unremarkable. Lungs/Pleura: Moderate to severe centrilobular emphysema. There may be a small amount of fluid tracking along the minor fissure. Image quality is degraded by respiratory motion. Peribronchovascular consolidation and nodularity in the lower lobes are new. Scattered subpleural lymph nodes along the fissures. 5 mm apical left upper lobe nodule, stable. Calcified granulomas. No pleural fluid. Debris seen in the left mainstem bronchus and left lower lobe bronchi. Upper Abdomen: Visualized portions of the liver, adrenal glands, kidneys, spleen, pancreas, stomach and bowel are grossly unremarkable. No upper abdominal adenopathy. Musculoskeletal: No worrisome lytic or sclerotic lesions. Review of the MIP images confirms the above findings. IMPRESSION: 1. Negative for pulmonary embolus. 2. New peribronchovascular consolidation and nodularity in both lower lobes, most indicative of an infectious bronchiolitis/bronchopneumonia. 3.  Emphysema (ICD10-J43.9). Electronically Signed   By: Lorin Picket M.D.   On: 09/10/2017 15:15   Ct Abdomen Pelvis W Contrast  Result Date: 09/10/2017 CLINICAL DATA:  Shortness of breath.  History gastric cancer. EXAM: CT ABDOMEN AND PELVIS WITH CONTRAST TECHNIQUE: Multidetector CT imaging of the abdomen and pelvis was performed using the standard protocol following bolus administration of intravenous  contrast. CONTRAST:  145mL ISOVUE-370 IOPAMIDOL (ISOVUE-370) INJECTION 76% COMPARISON:  06/22/2017. FINDINGS: Lower chest: No pleural effusion. Multiple new nodular densities are identified within both lung bases. Index nodule within the right base measures 1.4 cm, image 28/series 4. New from previous exam. Density within the posterior right lower lobe measures 1.1 cm, image 23  of series 4. Also new from previous exam. Anterior right lower lobe lung nodular density measures 0.9 cm, image 21 of series 4. New from previous exam. Hepatobiliary: No focal liver abnormality. The gallbladder appears normal. No biliary dilatation. Pancreas: Normal appearance of the pancreas. Spleen: The spleen is normal. Adrenals/Urinary Tract: The adrenal glands are unremarkable. Normal appearance of both kidneys. The urinary bladder is normal. Stomach/Bowel: Previous gastric mass is inconspicuous on today's study. The small bowel loops have a normal course and caliber. The appendix is visualized and appears normal. No pathologic dilatation of the colon. Vascular/Lymphatic: Aortic atherosclerosis. No aneurysm. No upper abdominal adenopathy identified. 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 A stent is identified in the left common iliac artery. A femoral bypass graft is identified. Reproductive: Prostate is unremarkable. Other: No abdominal wall hernia or abnormality. No abdominopelvic ascites. Musculoskeletal: No aggressive lytic or sclerotic bone lesions. IMPRESSION: 1. No acute findings identified within the abdomen or pelvis. 2. Interval development of multiple bilateral pulmonary densities within both lower lobes. These are indeterminate, likely post infectious or inflammatory in etiology. Atypical infection not excluded. Metastatic disease is considered less favored but not entirely excluded and follow-up imaging to ensure resolution is advised. 3. No evidence to suggest recurrent tumor or metastatic  disease within the abdomen or pelvis. 4.  Aortic Atherosclerosis (ICD10-I70.0). Electronically Signed   By: Kerby Moors M.D.   On: 09/10/2017 15:11    Procedures Procedures (including critical care time)  Medications Ordered in ED Medications  iopamidol (ISOVUE-300) 61 % injection (not administered)  sodium chloride 0.9 % injection (not administered)  ondansetron (ZOFRAN) injection 4 mg (not administered)  piperacillin-tazobactam (ZOSYN) IVPB 3.375 g (0 g Intravenous Stopped 09/10/17 1300)  vancomycin (VANCOCIN) IVPB 1000 mg/200 mL premix (0 mg Intravenous Stopped 09/10/17 1436)  sodium chloride 0.9 % bolus 1,000 mL (0 mLs Intravenous Stopped 09/10/17 1308)  albuterol (PROVENTIL) (2.5 MG/3ML) 0.083% nebulizer solution 5 mg (5 mg Nebulization Given 09/10/17 1455)  iopamidol (ISOVUE-370) 76 % injection (100 mLs  Contrast Given 09/10/17 1416)  furosemide (LASIX) injection 20 mg (20 mg Intravenous Given 09/10/17 1436)  famotidine (PEPCID) IVPB 20 mg premix (0 mg Intravenous Stopped 09/10/17 1510)     Initial Impression / Assessment and Plan / ED Course  I have reviewed the triage vital signs and the nursing notes.  Pertinent labs & imaging results that were available during my care of the patient were reviewed by me and considered in my medical decision making (see chart for details).     Patient is a 62 year old male who has a history of adenocarcinoma of the stomach and is currently receiving chemotherapy.  He presents with worsening cough and shortness of breath.  He also has diffuse weakness and vomiting.  He states he has not been able to eat or drink much.  He was treated for sepsis on arrival given his increased oxygen demand, immunocompromised state reported fever and chills at home and elevated white count.  He was treated with Zosyn and Vanco as well as initially some IV fluids.  His BNP is elevated and given that and his peripheral edema, he was given a small dose of Lasix however his chest  x-ray does not show any pulmonary edema.  CT scan of the chest was performed to rule out PE.  This was negative.  There however was some nodular opacities which could be atypical pneumonia versus pulmonary nodules/metastatic disease.  CT of his abdomen and pelvis  do not show any acute abnormalities.  Given the leg swelling that was asymmetric, he had Doppler ultrasounds to his lower extremities which were negative for DVT.  He was treated here in the emergency department but still does not feel much better.  He still feels short of breath and extremely fatigued.  I feel it behoove him to come in for IV antibiotics and IV fluids.  He was given a nebulizer treatment which did not seem to help his symptoms much.  I will consult the hospitalist for admission.  Final Clinical Impressions(s) / ED Diagnoses   Final diagnoses:  COPD exacerbation ([place])  Community acquired pneumonia, unspecified laterality    ED Discharge Orders    None       Malvin Johns, MD 09/10/17 [phone]

## 2017-09-10 NOTE — Plan of Care (Signed)
  Education: Knowledge of General Education information will improve 09/10/2017 2351 - Progressing by Tristan Schroeder, RN   Health Behavior/Discharge Planning: Ability to manage health-related needs will improve 09/10/2017 2351 - Progressing by Tristan Schroeder, RN   Clinical Measurements: Respiratory complications will improve 09/10/2017 2351 - Progressing by Tristan Schroeder, RN

## 2017-09-10 NOTE — H&P (Signed)
History and Physical    Bobby Caldwell MWU:[id number] DOB: 1955/09/30 DOA: 09/10/2017  PCP: Minette Brine   Patient coming from: Home  Chief Complaint: Shortness of Breath; Worsening Cough  HPI: Bobby Caldwell is a 62 y.o. 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 History was obtained with the assistance of [place] [phone]. Patient states he started feeling ill 3 days ago with worsening symptoms of having subjective fevers, dizziness, worsening SOB, vomiting with dry heaves, and Cough with "orange" sputum. Had Chemotherapy a 3days ago and has been coughing up sputum that was orange in color. Also states he has no appetite and that his food feels like it gets stuck in his chest causing chest discomfort but is able to get it down eventually. Also complains of pedal edema and being weaker. Presented to the ED for these symptoms and [facility] was asked to admit for PNA and Acute on Chronic Respiratory Failure.  ED Course: Had basic blood work, CTA of Chest and CT Abd/Pelvis, CXR, was given Nebs, 1 Liter and IV Lasix.   Review of Systems: As per HPI otherwise 10 point review of systems negative. No diarrhea or burning or urinary discomfort.   Past Medical History:  Diagnosis Date  . COPD (chronic obstructive pulmonary disease) ([place])   . Dyspnea   . PAD (peripheral artery disease) ([place])   . Stomach cancer ([place])   . Tobacco abuse   . Weight loss     Past Surgical History:  Procedure Laterality Date  . ABDOMINAL AORTOGRAM W/LOWER EXTREMITY N/A 05/03/2017   Procedure: ABDOMINAL AORTOGRAM W/LOWER EXTREMITY;  Surgeon: Conrad Mesquite, MD;  Location: [facility];  Service: Cardiovascular;  Laterality: N/A;  .  ESOPHAGOGASTRODUODENOSCOPY     05/24/17  . ESOPHAGOGASTRODUODENOSCOPY (EGD) WITH PROPOFOL N/A 05/24/2017   Procedure: ESOPHAGOGASTRODUODENOSCOPY (EGD) WITH PROPOFOL;  Surgeon: [name], MD;  Location: [facility];  Service: Endoscopy;  Laterality: N/A;  . EUS N/A 06/07/2017   Procedure: UPPER ENDOSCOPIC ULTRASOUND (EUS) RADIAL;  Surgeon: [name], MD;  Location: [facility];  Service: Endoscopy;  Laterality: N/A;  . FEMORAL-FEMORAL BYPASS GRAFT Bilateral 05/08/2017   Procedure: LEFT TO RIGHT BYPASS GRAFT FEMORAL-FEMORAL ARTERY;  Surgeon: Conrad Port Dickinson, MD;  Location: [facility];  Service: Vascular;  Laterality: Bilateral;  . IR FLUORO GUIDE PORT INSERTION RIGHT  07/05/2017  . IR US GUIDE VASC ACCESS RIGHT  07/05/2017  . NM MYOVIEW LTD  06/2017   LOW RISK. Small basal inferoseptal defect thought to be related to artifact (although cannot exclude prior infarct). Normal wall motion i in this area would suggest artifact not infarct. Otherwise no ischemia or infarction.  Marland Kitchen PERIPHERAL VASCULAR INTERVENTION Left 05/03/2017   Procedure: PERIPHERAL VASCULAR INTERVENTION;  Surgeon: Conrad Riverbend, MD;  Location: [facility];  Service: Cardiovascular;  Laterality: Left;  common iliac  . TRANSTHORACIC ECHOCARDIOGRAM  06/26/2017   Normal LV size and function. EF 60-65%. No wall motion abnormalities. GR 1 DD. Mild LA dilation   SOCIAL HISTORY  reports that he has been smoking cigarettes.  He has a 50.00 pack-year smoking history. he has never used smokeless tobacco. He reports that he does not drink alcohol or use drugs.  ALLERGIES No Known Allergies  Family  History  Family history unknown: Yes    Prior to Admission medications   Medication Sig Start Date End Date Taking? Authorizing Provider  Albuterol Sulfate (PROAIR RESPICLICK) 979 (90 Base) MCG/ACT AEPB Inhale 2 puffs into the lungs every 6 (six) hours as needed. 09/04/17  Yes [name], MD  budesonide-formoterol  [facility]) 160-4.5 MCG/ACT inhaler Inhale 2 puffs into the lungs 2 (two) times daily. 09/04/17  Yes [name], MD  dexamethasone (DECADRON) 4 MG tablet Take 2 tablets (8 mg total) by mouth daily. Start the day after chemotherapy for 2 days. Take with food. 07/07/17  Yes [name], MD  dronabinol (MARINOL) 2.5 MG capsule Take 1 capsule (2.5 mg total) by mouth 2 (two) times daily before a meal. 09/03/17  Yes Alla Feeling, NP  lidocaine-prilocaine (EMLA) cream Apply to affected area once Patient taking differently: Apply 1 application topically once as needed (For port-a-cath.).  07/06/17  Yes [name], MD  methylPREDNISolone (MEDROL DOSEPAK) 4 MG TBPK tablet Take as directed 08/20/17  Yes Alla Feeling, NP  nystatin (MYCOSTATIN) 100000 UNIT/ML suspension Take 5 mLs (500,000 Units total) by mouth 4 (four) times daily for 7 days. 09/03/17 09/10/17 Yes Alla Feeling, NP  ondansetron (ZOFRAN) 8 MG tablet Take 1 tablet (8 mg total) by mouth 2 (two) times daily as needed for refractory nausea / vomiting. Start on day 3 after chemotherapy. 07/06/17  Yes [name], MD  prochlorperazine (COMPAZINE) 10 MG tablet Take 1 tablet (10 mg total) by mouth every 6 (six) hours as needed (Nausea or vomiting). 07/06/17  Yes [name], MD  Tiotropium Bromide Monohydrate (SPIRIVA RESPIMAT) 2.5 MCG/ACT AERS Inhale 2 puffs into the lungs daily. 09/04/17  Yes [name], MD  atorvastatin (LIPITOR) 10 MG tablet Take 1 tablet (10 mg total) by mouth daily. Patient not taking: Reported on 09/10/2017 05/08/17   Gabriel Earing, PA-C  clopidogrel (PLAVIX) 75 MG tablet Take 1 tablet (75 mg total) by mouth daily. Patient not taking: Reported on 09/10/2017 05/03/17   Conrad [place], MD  LORazepam (ATIVAN) 0.5 MG tablet Take 1 tablet (0.5 mg total) by mouth every 6 (six) hours as needed (Nausea or vomiting). Patient not taking: Reported on 09/10/2017 07/07/17   [name], MD  mirtazapine (REMERON) 7.5 MG tablet Take 1 tablet (7.5  mg total) by mouth at bedtime. Patient not taking: Reported on 09/10/2017 07/11/17   [name], MD  oxyCODONE-acetaminophen (ROXICET) 5-325 MG tablet Take 1 tablet by mouth every 6 (six) hours as needed for severe pain. Patient not taking: Reported on 09/10/2017 05/08/17   Rhyne, Hulen Shouts, PA-C  traMADol (ULTRAM) 50 MG tablet Take 1 tablet (50 mg total) by mouth every 6 (six) hours as needed. Patient not taking: Reported on 09/10/2017 08/20/17   Alla Feeling, NP   Physical Exam: Vitals:   09/10/17 2000 09/10/17 2024 09/10/17 2100 09/10/17 2200  BP: 126/84 140/84 113/75 132/76  Pulse: 81 97 79 70  Resp: (!) 4 17 16 13   Temp:  97.8 F (36.6 C)    TempSrc:  Oral    SpO2: 94% 98% 97% 98%   Constitutional: Thin Male in NAD and appears calm and comfortable Eyes: Lds and conjunctivae normal, sclerae anicteric  ENMT: External Ears, Nose appear normal. Grossly normal hearing. Mucous membranes are moist.  Neck: Appears normal, supple, no cervical masses, normal ROM, no appreciable thyromegaly, no JVD Respiratory: Diminished to auscultation bilaterally with some wheezing; no appreciable rales,  rhonchi or crackles. Normal respiratory effort and patient is not tachypenic. No accessory muscle use. Wearing Supplemental O2 via [place] Cardiovascular: RRR, no murmurs / rubs / gallops. S1 and S2 auscultated. Had Pedal Edema Chest Wall: Port-A-Cath in Place Abdomen: Soft, non-tender, non-distended. No masses palpated. No appreciable hepatosplenomegaly. Bowel sounds positive x4.  GU: Deferred. Musculoskeletal: No clubbing / cyanosis of digits/nails. No joint deformity upper and lower extremities.  Skin: No rashes, lesions, ulcers on a limited skin eval. No induration; Warm and dry.  Neurologic: CN 2-12 grossly intact with no focal deficits. Romberg sign and cerebellar reflexes not assessed.  Psychiatric: Normal judgment and insight. Alert and oriented x 3. Normal mood and appropriate affect.   Labs on Admission:  I have personally reviewed following labs and imaging studies  CBC: Recent Labs  Lab 09/10/17 1150  WBC 8.4  NEUTROABS 6.5  HGB 12.9*  HCT 40.4  MCV 89.4  PLT 196   Basic Metabolic Panel: Recent Labs  Lab 09/10/17 1150  NA 142  K 3.4*  CL 99*  CO2 34*  GLUCOSE 145*  BUN 12  CREATININE 0.48*  CALCIUM 8.0*   GFR: Estimated Creatinine Clearance: 74.4 mL/min (A) (by C-G formula based on SCr of 0.48 mg/dL (L)). Liver Function Tests: Recent Labs  Lab 09/10/17 1150  AST 18  ALT 20  ALKPHOS 73  BILITOT 0.6  PROT 5.6*  ALBUMIN 2.9*   No results for input(s): LIPASE, AMYLASE in the last 168 hours. No results for input(s): AMMONIA in the last 168 hours. Coagulation Profile: No results for input(s): INR, PROTIME in the last 168 hours. Cardiac Enzymes: No results for input(s): CKTOTAL, CKMB, CKMBINDEX, TROPONINI in the last 168 hours. BNP (last 3 results) Recent Labs    03/26/17 1040  PROBNP 25.0   HbA1C: No results for input(s): HGBA1C in the last 72 hours. CBG: No results for input(s): GLUCAP in the last 168 hours. Lipid Profile: No results for input(s): CHOL, HDL, LDLCALC, TRIG, CHOLHDL, LDLDIRECT in the last 72 hours. Thyroid Function Tests: No results for input(s): TSH, T4TOTAL, FREET4, T3FREE, THYROIDAB in the last 72 hours. Anemia Panel: No results for input(s): VITAMINB12, FOLATE, FERRITIN, TIBC, IRON, RETICCTPCT in the last 72 hours. Urine analysis:    Component Value Date/Time   COLORURINE STRAW (A) 09/10/2017 1258   APPEARANCEUR CLEAR 09/10/2017 1258   LABSPEC 1.006 09/10/2017 1258   PHURINE 8.0 09/10/2017 1258   GLUCOSEU NEGATIVE 09/10/2017 1258   HGBUR NEGATIVE 09/10/2017 [place] 09/10/2017 1258   KETONESUR NEGATIVE 09/10/2017 1258   PROTEINUR NEGATIVE 09/10/2017 1258   NITRITE NEGATIVE 09/10/2017 1258   LEUKOCYTESUR NEGATIVE 09/10/2017 1258   Sepsis Labs:  !!!!!!!!!!!!!!!!!!!!!!!!!!!!!!!!!!!!!!!!!!!! @LABRCNTIP (procalcitonin:4,lacticidven:4) )No results found for this or any previous visit (from the past 240 hour(s)).   Radiological Exams on Admission: Dg Chest 1 View  Result Date: 09/10/2017 CLINICAL DATA:  Sensation of food being stuck in the chest. EXAM: CHEST 1 VIEW COMPARISON:  Chest x-ray of May 08, 2017 FINDINGS: The lungs remain hyperinflated. There is no focal infiltrate. There is no pleural effusion. The heart and pulmonary vascularity are normal. The mediastinum is normal in width. The trachea is midline. The porta catheter tip projects over the distal third of the SVC. The bony thorax exhibits no acute abnormality. IMPRESSION: Chronic bronchitic-smoking related changes, stable. No acute pneumonia nor CHF. No mediastinal abnormality is observed. Electronically Signed   By: David  Martinique M.D.   On: 09/10/2017 12:33   Ct Angio  Chest Pe W/cm &/or Wo Cm  Result Date: 09/10/2017 CLINICAL DATA:  Gastric cancer, shortness of breath, fever. EXAM: CT ANGIOGRAPHY CHEST WITH CONTRAST TECHNIQUE: Multidetector CT imaging of the chest was performed using the standard protocol during bolus administration of intravenous contrast. Multiplanar CT image reconstructions and MIPs were obtained to evaluate the vascular anatomy. CONTRAST:  147mL ISOVUE-370 IOPAMIDOL (ISOVUE-370) INJECTION 76% COMPARISON:  10/21/2016. FINDINGS: Cardiovascular: Negative for pulmonary embolus. Right IJ Port-A-Cath terminates at the SVC RA junction. Heart size normal. No pericardial effusion. Mediastinum/Nodes: No pathologically enlarged mediastinal, hilar or axillary lymph nodes. Esophagus is grossly unremarkable. Lungs/Pleura: Moderate to severe centrilobular emphysema. There may be a small amount of fluid tracking along the minor fissure. Image quality is degraded by respiratory motion. Peribronchovascular consolidation and nodularity in the lower lobes are new. Scattered subpleural  lymph nodes along the fissures. 5 mm apical left upper lobe nodule, stable. Calcified granulomas. No pleural fluid. Debris seen in the left mainstem bronchus and left lower lobe bronchi. Upper Abdomen: Visualized portions of the liver, adrenal glands, kidneys, spleen, pancreas, stomach and bowel are grossly unremarkable. No upper abdominal adenopathy. Musculoskeletal: No worrisome lytic or sclerotic lesions. Review of the MIP images confirms the above findings. IMPRESSION: 1. Negative for pulmonary embolus. 2. New peribronchovascular consolidation and nodularity in both lower lobes, most indicative of an infectious bronchiolitis/bronchopneumonia. 3.  Emphysema (ICD10-J43.9). Electronically Signed   By: Lorin Picket M.D.   On: 09/10/2017 15:15   Ct Abdomen Pelvis W Contrast  Result Date: 09/10/2017 CLINICAL DATA:  Shortness of breath.  History gastric cancer. EXAM: CT ABDOMEN AND PELVIS WITH CONTRAST TECHNIQUE: Multidetector CT imaging of the abdomen and pelvis was performed using the standard protocol following bolus administration of intravenous contrast. CONTRAST:  193mL ISOVUE-370 IOPAMIDOL (ISOVUE-370) INJECTION 76% COMPARISON:  06/22/2017. FINDINGS: Lower chest: No pleural effusion. Multiple new nodular densities are identified within both lung bases. Index nodule within the right base measures 1.4 cm, image 28/series 4. New from previous exam. Density within the posterior right lower lobe measures 1.1 cm, image 23 of series 4. Also new from previous exam. Anterior right lower lobe lung nodular density measures 0.9 cm, image 21 of series 4. New from previous exam. Hepatobiliary: No focal liver abnormality. The gallbladder appears normal. No biliary dilatation. Pancreas: Normal appearance of the pancreas. Spleen: The spleen is normal. Adrenals/Urinary Tract: The adrenal glands are unremarkable. Normal appearance of both kidneys. The urinary bladder is normal. Stomach/Bowel: Previous gastric mass is  inconspicuous on today's study. The small bowel loops have a normal course and caliber. The appendix is visualized and appears normal. No pathologic dilatation of the colon. Vascular/Lymphatic: Aortic atherosclerosis. No aneurysm. No upper abdominal adenopathy identified. 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 A stent is identified in the left common iliac artery. A femoral bypass graft is identified. Reproductive: Prostate is unremarkable. Other: No abdominal wall hernia or abnormality. No abdominopelvic ascites. Musculoskeletal: No aggressive lytic or sclerotic bone lesions. IMPRESSION: 1. No acute findings identified within the abdomen or pelvis. 2. Interval development of multiple bilateral pulmonary densities within both lower lobes. These are indeterminate, likely post infectious or inflammatory in etiology. Atypical infection not excluded. Metastatic disease is considered less favored but not entirely excluded and follow-up imaging to ensure resolution is advised. 3. No evidence to suggest recurrent tumor or metastatic disease within the abdomen or pelvis. 4.  Aortic Atherosclerosis (ICD10-I70.0). Electronically Signed   By: Kerby Moors M.D.   On:  09/10/2017 15:11   EKG: Independently reviewed. Showed Sinus Rhythm at a rate of 94 and mild T wave changes in Inferior Leads. No evidence of ST Elevation.   Assessment/Plan Active Problems:   COPD (chronic obstructive pulmonary disease) with emphysema (HCC)   Cigarette smoker   Atherosclerosis of native arteries of extremity with intermittent claudication (HCC)   Gastric cancer ([facility]CC)   DOE (dyspnea on exertion)   Pneumonia   Acute on chronic respiratory failure with hypoxia ([facility])  Acute on Chronic  Respiratory Failure with Hypoxia -Recently started on Home O2 2 Liters by Pulmonologist Dr. Melvyn Neth -Advised to use it 24/7 -Now needing 3 Liters of O2 likely 2/2 to PNA -Continuous Pulse Oximetery and Wean O2 as  Tolerated -C/w Supplemenatl O2 and Maintain O2 Saturations >92 -Repeat CXR in AM  Community Acquired Pneumonia -ED felt patient was Septic on Admission but did not meed Sepsis Criteria as he was not tachypenic, tachycardic, febrile, or have a WBC -Had a mild Lactic Acidosis and BP were Soft -Given 1 Liter in ED andthen right after 1 Liter was given IV Lasix  -Admitted to Telemetry -Community Acquired PNA Pathway -Added Steroids IV Solumedrol 40 mg q12h -IVF Rehydration with NS at 100 mL/hr -IV Azithromycin and IV Ceftriaxone for Abx Coverage -DuoNebs scheduled -Flutter Valve and Incentive Spirometry and  -Influenza A/B Negative; Check Respiratory Virus Panel -Repeat CXR in AM -CTA of Chest showed New peribronchovascular consolidation and nodularity in both lower lobes, most indicative of an infectious bronchiolitis/bronchopneumonia and Emphysema.   Elevated D-Dimer -Likely in the Setting of Adenocarcinoma of the Stomach -D-Dimer was 1.32 -CTA of Chest showed Negative for pulmonary embolus. -LE Duplex showed Bilateral Negative for DVT  Dysphagia -Full Liquid Diet -Obtain SLP and based on their evaluation possible MBS and GI Consultation with [facility] as pateitn sees Dr. [name] -Has Hx of Adenocarcinoma of Stomach   Hypokalemia -Patient's K+ was 3.4 -Replete with 59 mEQ -Continue to Monitor and Replete as Necessary -Repeat in AM   Stage I Adenocarcinoma of the Stomach -CT Scan of Abd/Pelvis showed Previous gastric mass is inconspicuous on today's study. The small bowel loops have a normal course and caliber. The appendix is visualized and appears normal. No pathologic dilatation of the colon. -Undergoing Chemo Therapy and Followed by Dr. Burr Medico -Treaterd with Oxaliplatin, Leucovorin, and 5-FU and had Cycle 4 -Day 1 on 09/06/17  COPD -As above -Started IV Solumedrol -C/w Nebs and Home [facility] Dulera Substitution -C/w Supplemental O2 -C/w Abx   Hx  of PAD with Fem-Fem Bypass  -Was on Plavix as an outpatient but not taking anymore because it was held for a Procedure and Abdominal Aortogram and Fem-Fem Bypass -Will need to Resume Plavix 75 mg po Daily and Atorvastain 40 mg po qHS  Chest Discomfort r/o ACS -EKG showed Sinus Rhythm with Twave Abnormalities in Inferior Leads -Obtain Troponin x3 -Repeat EKG in AM   DVT prophylaxis: Enoxaparin 40 mg sq  Code Status: FULL CODE Family Communication: No family present at bedside Disposition Plan: Anticipate D/C Home Consults called: None Admission status: Inpatient Telemetry  Severity of Illness: The appropriate patient status for this patient is INPATIENT. Inpatient status is judged to be reasonable and necessary in order to provide the required intensity of service to ensure the patient's safety. The patient's presenting symptoms, physical exam findings, and initial radiographic and laboratory data in the context of their chronic comorbidities is felt to place them at high risk for further clinical  deterioration. Furthermore, it is not anticipated that the patient will be medically stable for discharge from the hospital within 2 midnights of admission. The following factors support the patient status of inpatient.   " The patient's presenting symptoms include Shortness of Breath and Cough. " The worrisome physical exam findings include Chest Discomfort on palpation. " The initial radiographic and laboratory data are worrisome because of Pneumonia. " The chronic co-morbidities include COPD and Adenocarcinoma of the Stomach.  * I certify that at the point of admission it is my clinical judgment that the patient will require inpatient hospital care spanning beyond 2 midnights from the point of admission due to high intensity of service, high risk for further deterioration and high frequency of surveillance required.Kerney Elbe, D.O. [facility] Pager [phone]  If  7PM-7AM, please contact night-coverage [email or web address] Password [facility]  09/10/2017, 10:47 PM

## 2017-09-10 NOTE — Progress Notes (Deleted)
Established Critical Limb Ischemia Patient   History of Present Illness   Bobby Caldwell is a 62 y.o. (09/04/1955) male who presents with chief complaint: ***.   Prior procedures include: L ? R fem-fem BPG (05/08/17).    In the interval period between his surgery and follow up, patient was diagnosed with stomach cancer.   The patient has *** rest pain and wounds include: ***.  The patient notes symptoms have *** progressed.  The patient's treatment regimen currently included: maximal medical management and ***.  The patient's PMH, PSH, SH, and FamHx are unchanged from 05/25/17.  Current Outpatient Medications  Medication Sig Dispense Refill  . Albuterol Sulfate (PROAIR RESPICLICK) 742 (90 Base) MCG/ACT AEPB Inhale 2 puffs into the lungs every 6 (six) hours as needed. 1 each 6  . atorvastatin (LIPITOR) 10 MG tablet Take 1 tablet (10 mg total) by mouth daily. 30 tablet 2  . budesonide-formoterol (SYMBICORT) 160-4.5 MCG/ACT inhaler Inhale 2 puffs into the lungs 2 (two) times daily. 1 Inhaler 6  . clopidogrel (PLAVIX) 75 MG tablet Take 1 tablet (75 mg total) by mouth daily. 30 tablet 11  . dexamethasone (DECADRON) 4 MG tablet Take 2 tablets (8 mg total) by mouth daily. Start the day after chemotherapy for 2 days. Take with food. 30 tablet 1  . dronabinol (MARINOL) 2.5 MG capsule Take 1 capsule (2.5 mg total) by mouth 2 (two) times daily before a meal. 20 capsule 0  . lidocaine-prilocaine (EMLA) cream Apply to affected area once 30 g 3  . loperamide (IMODIUM A-D) 2 MG tablet Take 2 mg by mouth 4 (four) times daily as needed for diarrhea or loose stools.    Marland Kitchen LORazepam (ATIVAN) 0.5 MG tablet Take 1 tablet (0.5 mg total) by mouth every 6 (six) hours as needed (Nausea or vomiting). 30 tablet 0  . methylPREDNISolone (MEDROL DOSEPAK) 4 MG TBPK tablet Take as directed 21 tablet 0  . mirtazapine (REMERON) 7.5 MG tablet Take 1 tablet (7.5 mg total) by mouth at bedtime. 30 tablet 1  . nystatin  (MYCOSTATIN) 100000 UNIT/ML suspension Take 5 mLs (500,000 Units total) by mouth 4 (four) times daily for 7 days. 240 mL 0  . ondansetron (ZOFRAN) 8 MG tablet Take 1 tablet (8 mg total) by mouth 2 (two) times daily as needed for refractory nausea / vomiting. Start on day 3 after chemotherapy. 30 tablet 1  . oxyCODONE-acetaminophen (ROXICET) 5-325 MG tablet Take 1 tablet by mouth every 6 (six) hours as needed for severe pain. 20 tablet 0  . prochlorperazine (COMPAZINE) 10 MG tablet Take 1 tablet (10 mg total) by mouth every 6 (six) hours as needed (Nausea or vomiting). 30 tablet 1  . Tiotropium Bromide Monohydrate (SPIRIVA RESPIMAT) 2.5 MCG/ACT AERS Inhale 2 puffs into the lungs daily. 1 Inhaler 6  . traMADol (ULTRAM) 50 MG tablet Take 1 tablet (50 mg total) by mouth every 6 (six) hours as needed. 30 tablet 0   No current facility-administered medications for this visit.     On ROS today: ***, ***   Physical Examination  ***There were no vitals filed for this visit. ***There is no height or weight on file to calculate BMI.  General {LOC:19197::"Somulent","Alert"}, {Orientation:19197::"Confused","O x 3"}, {Weight:19197::"Obese","Cachectic","WD"}, {General state of health:19197::"Ill appearing","Elderly","NAD"}  Pulmonary {Chest wall:19197::"Asx chest movement","Sym exp"}, {Air movt:19197::"Decreased *** air movt","good B air movt"}, {BS:19197::"rales on ***","rhonchi on ***","wheezing on ***","CTA B"}  Cardiac {Rhythm:19197::"Irregularly, irregular rate and rhythm","RRR, Nl S1, S2"}, {Murmur:19197::"Murmur present: ***","no Murmurs"}, {Rubs:19197::"Rub  present: ***","No rubs"}, {Gallop:19197::"Gallop present: ***","No S3,S4"}  Vascular Vessel Right Left  Radial {Palpable:19197::"Not palpable","Faintly palpable","Palpable"} {Palpable:19197::"Not palpable","Faintly palpable","Palpable"}  Brachial {Palpable:19197::"Not palpable","Faintly palpable","Palpable"} {Palpable:19197::"Not palpable","Faintly  palpable","Palpable"}  Carotid Palpable, {Bruit:19197::"Bruit present","No Bruit"} Palpable, {Bruit:19197::"Bruit present","No Bruit"}  Aorta Not palpable N/A  Femoral {Palpable:19197::"Not palpable","Faintly palpable","Palpable"} {Palpable:19197::"Not palpable","Faintly palpable","Palpable"}  Popliteal {Palpable:19197::"Prominently palpable","Not palpable"} {Palpable:19197::"Prominently palpable","Not palpable"}  PT {Palpable:19197::"Not palpable","Faintly palpable","Palpable"} {Palpable:19197::"Not palpable","Faintly palpable","Palpable"}  DP {Palpable:19197::"Not palpable","Faintly palpable","Palpable"} {Palpable:19197::"Not palpable","Faintly palpable","Palpable"}    Gastro- intestinal soft, {Distension:19197::"distended","non-distended"}, {TTP:19197::"TTP in *** quadrant","appropriate tenderness to palpation","non-tender to palpation"}, {Guarding:19197::"Guarding and rebound in *** quadrant","No guarding or rebound"}, {HSM:19197::"HSM present","no HSM"}, {Masses:19197::"Mass present: ***","no masses"}, {Flank:19197::"CVAT on ***","Flank bruit present on ***","no CVAT B"}, {AAA:19197::"Palpable prominent aortic pulse","No palpable prominent aortic pulse"}, {Surgical incision:19197::"Surg incisions: ***","Surgical incisions well healed"," "}  Musculo- skeletal M/S 5/5 throughout {MS:19197::"except ***"," "}, Extremities without ischemic changes {MS:19197::"except ***"," "}, {Edema:19197::"Pitting edema present: ***","Non-pitting edema present: ***","No edema present"}, {Varicosities:19197::"Varicosities present: ***","No visible varicosities "}, {LDS:19197::"Lipodermatosclerosis present: ***","No Lipodermatosclerosis present"}  Neurologic Cranial nerves 2-12 intact{CN:19197::" except ***"," "}, Pain and light touch intact in extremities{CN:19197::" except for decreased sensation in ***"," "}, Motor exam as listed above    Non-Invasive Vascular Imaging   ABI (***)  R:   ABI: *** (1.14),    PT: {Signals:19197::"none","mono","bi","tri"}  DP: {Signals:19197::"none","mono","bi","tri"}  TBI:  ***  L:   ABI: *** (1.08),   PT: {Signals:19197::"none","mono","bi","tri"}  DP: {Signals:19197::"none","mono","bi","tri"}  TBI: ***   Medical Decision Making   Bobby Caldwell is a 62 y.o. male who presents with: L ? R fem-fem BPG for occupation limiting intermittent claudication with intermittent rest pain, gastric cancer   Based on the patient's vascular studies and examination, I have offered the patient: ***.  I discussed in depth with the patient the nature of atherosclerosis, and emphasized the importance of maximal medical management including strict control of blood pressure, blood glucose, and lipid levels, antiplatelet agents, obtaining regular exercise, and cessation of smoking.    The patient is aware that without maximal medical management the underlying atherosclerotic disease process will progress, limiting the benefit of any interventions. The patient is currently on a statin: Lipitor.  The patient is currently on an anti-platelet: Plavix.  Thank you for allowing [place] to participate in this patient's care.   Adele Barthel, MD, FACS [facility] Office: [phone] Pager: [phone]

## 2017-09-10 NOTE — ED Notes (Signed)
Attempted to call report Jequetta RN unable to take at this time.

## 2017-09-10 NOTE — Progress Notes (Signed)
Bilateral lower extremity venous duplex has been completed. Negative for DVT. Results were given to Dr. Tamera Punt.  09/10/17 12:52 PM Bobby Caldwell RVT

## 2017-09-10 NOTE — ED Notes (Signed)
Vascular tech at bedside. °

## 2017-09-10 NOTE — Progress Notes (Signed)
A consult was received from an ED physician for Vancomycin & Zosyn per pharmacy dosing.  The patient's profile has been reviewed for ht/wt/allergies/indication/available labs.   A one time order has been placed for Vancomycin 1gm & Zosyn 3.375gm.  Further antibiotics/pharmacy consults should be ordered by admitting physician if indicated.                       Thank you, Biagio Borg 09/10/2017  12:18 PM

## 2017-09-10 NOTE — ED Notes (Signed)
Patient transported to CT 

## 2017-09-10 NOTE — ED Notes (Signed)
Bed: FV43 Expected date:  Expected time:  Means of arrival:  Comments: Ems-FEVER

## 2017-09-10 NOTE — ED Triage Notes (Signed)
Transported by [facility] from home--[facility] patient, reporting [facility] and "not feeling well." Patient speaks minimal English. EMS administered 1 g of Tylenol/ 1 L of NS/ & 10 mg Albuterol and 0.5 Atrovent neb.

## 2017-09-10 NOTE — ED Notes (Signed)
ED TO INPATIENT HANDOFF REPORT  Name/Age/Gender Bobby Caldwell 62 y.o. male  Code Status Code Status History    Date Active Date Inactive Code Status Order ID Comments User Context   05/08/2017 13:38 05/09/2017 16:59 Full Code [id number]  Natividad Brood Inpatient   05/03/2017 17:16 05/04/2017 21:27 Full Code [id number]  Conrad [place], MD Inpatient   05/03/2017 11:25 05/03/2017 17:16 Full Code [id number]  Conrad [place], MD Inpatient      Home/SNF/Other Home  Chief Complaint possible sepsis   Level of Care/Admitting Diagnosis ED Disposition    ED Disposition Condition [place] [facility] Area: [place] [[id number]]  Level of Care: Telemetry [5]  Diagnosis: Pneumonia [[id number]]  Admitting Physician: [place], [place] [[id number]]  Attending Physician: Raiford Noble LATIF [[id number]]  Estimated length of stay: past midnight tomorrow  Certification:: I certify this patient will need inpatient services for at least 2 midnights  PT Class (Do Not Modify): Inpatient [101]  PT Acc Code (Do Not Modify): Private [1]       Medical History Past Medical History:  Diagnosis Date  . COPD (chronic obstructive pulmonary disease) ([place])   . Dyspnea   . PAD (peripheral artery disease) ([place])   . Stomach cancer ([place])   . Tobacco abuse   . Weight loss     Allergies No Known Allergies  IV Location/Drains/Wounds Patient Lines/Drains/Airways Status   Active Line/Drains/Airways    Name:   Placement date:   Placement time:   Site:   Days:   Implanted Port 07/05/17 Right Chest   07/05/17    1242    Chest   67   Peripheral IV 09/10/17 Left Forearm   09/10/17    1138    Forearm   less than 1   Incision (Closed) 05/08/17 Groin Left   05/08/17    0915     125   Incision (Closed) 05/08/17 Groin Right   05/08/17    0915     125          Labs/Imaging Results for orders placed or performed during the hospital encounter of 09/10/17 (from the past 48 hour(s))   Comprehensive metabolic panel     Status: Abnormal   Collection Time: 09/10/17 11:50 AM  Result Value Ref Range   Sodium 142 135 - 145 mmol/L   Potassium 3.4 (L) 3.5 - 5.1 mmol/L   Chloride 99 (L) 101 - 111 mmol/L   CO2 34 (H) 22 - 32 mmol/L   Glucose, Bld 145 (H) 65 - 99 mg/dL   BUN 12 6 - 20 mg/dL   Creatinine, Ser 0.48 (L) 0.61 - 1.24 mg/dL   Calcium 8.0 (L) 8.9 - 10.3 mg/dL   Total Protein 5.6 (L) 6.5 - 8.1 g/dL   Albumin 2.9 (L) 3.5 - 5.0 g/dL   AST 18 15 - 41 U/L   ALT 20 17 - 63 U/L   Alkaline Phosphatase 73 38 - 126 U/L   Total Bilirubin 0.6 0.3 - 1.2 mg/dL   GFR calc non Af Amer >60 >60 mL/min   GFR calc Af Amer >60 >60 mL/min    Comment: (NOTE) The eGFR has been calculated using the CKD EPI equation. This calculation has not been validated in all clinical situations. eGFR's persistently <60 mL/min signify possible Chronic Kidney Disease.    Anion gap 9 5 - 15    Comment: Performed at [facility], [address] [place], [place]  [address]  CBC WITH DIFFERENTIAL     Status: Abnormal   Collection Time: 09/10/17 11:50 AM  Result Value Ref Range   WBC 8.4 4.0 - 10.5 K/uL   RBC 4.52 4.22 - 5.81 MIL/uL   Hemoglobin 12.9 (L) 13.0 - 17.0 g/dL   HCT 40.4 39.0 - 52.0 %   MCV 89.4 78.0 - 100.0 fL   MCH 28.5 26.0 - 34.0 pg   MCHC 31.9 30.0 - 36.0 g/dL   RDW 16.1 (H) 11.5 - 15.5 %   Platelets 227 150 - 400 K/uL   Neutrophils Relative % 77 %   Neutro Abs 6.5 1.7 - 7.7 K/uL   Lymphocytes Relative 20 %   Lymphs Abs 1.7 0.7 - 4.0 K/uL   Monocytes Relative 3 %   Monocytes Absolute 0.2 0.1 - 1.0 K/uL   Eosinophils Relative 0 %   Eosinophils Absolute 0.0 0.0 - 0.7 K/uL   Basophils Relative 0 %   Basophils Absolute 0.0 0.0 - 0.1 K/uL    Comment: Performed at [facility], [place] [address], [place], [place] [address]  D-dimer, quantitative (not at [facility])     Status: Abnormal   Collection Time: 09/10/17 11:50 AM  Result Value Ref Range    D-Dimer, Quant 1.32 (H) 0.00 - 0.50 ug/mL-FEU    Comment: (NOTE) At the manufacturer cut-off of 0.50 ug/mL FEU, this assay has been documented to exclude PE with a sensitivity and negative predictive value of 97 to 99%.  At this time, this assay has not been approved by the FDA to exclude DVT/VTE. Results should be correlated with clinical presentation. Performed at [facility], [place] [address], [place], [place] [address]   Brain natriuretic peptide     Status: Abnormal   Collection Time: 09/10/17 11:50 AM  Result Value Ref Range   B Natriuretic Peptide 355.3 (H) 0.0 - 100.0 pg/mL    Comment: Performed at [facility], [place] [address] [place], [place] [address]  I-Stat CG4 Lactic Acid, ED     Status: Abnormal   Collection Time: 09/10/17 11:56 AM  Result Value Ref Range   Lactic Acid, Venous 2.09 (HH) 0.5 - 1.9 mmol/L   Comment NOTIFIED PHYSICIAN   Urinalysis, Routine w reflex microscopic     Status: Abnormal   Collection Time: 09/10/17 12:58 PM  Result Value Ref Range   Color, Urine STRAW (A) YELLOW   APPearance CLEAR CLEAR   Specific Gravity, Urine 1.006 1.005 - 1.030   pH 8.0 5.0 - 8.0   Glucose, UA NEGATIVE NEGATIVE mg/dL   Hgb urine dipstick NEGATIVE NEGATIVE   Bilirubin Urine NEGATIVE NEGATIVE   Ketones, ur NEGATIVE NEGATIVE mg/dL   Protein, ur NEGATIVE NEGATIVE mg/dL   Nitrite NEGATIVE NEGATIVE   Leukocytes, UA NEGATIVE NEGATIVE    Comment: Performed at [place] [address] [place], [place] [address]  Influenza panel by PCR (type A & B)     Status: None   Collection Time: 09/10/17 12:58 PM  Result Value Ref Range   Influenza A By PCR NEGATIVE NEGATIVE   Influenza B By PCR NEGATIVE NEGATIVE    Comment: (NOTE) The Xpert Xpress Flu assay is intended as an aid in the diagnosis of  influenza and should not be used as a sole basis for treatment.  This  assay is FDA approved for nasopharyngeal swab specimens  only. Nasal  washings and aspirates are unacceptable for Xpert Xpress Flu testing. Performed  at [facility], [place] [address], [place], [place] [address]   I-Stat CG4 Lactic Acid, ED     Status: None   Collection Time: 09/10/17  2:43 PM  Result Value Ref Range   Lactic Acid, Venous 1.41 0.5 - 1.9 mmol/L   Dg Chest 1 View  Result Date: 09/10/2017 CLINICAL DATA:  Sensation of food being stuck in the chest. EXAM: CHEST 1 VIEW COMPARISON:  Chest x-ray of May 08, 2017 FINDINGS: The lungs remain hyperinflated. There is no focal infiltrate. There is no pleural effusion. The heart and pulmonary vascularity are normal. The mediastinum is normal in width. The trachea is midline. The porta catheter tip projects over the distal third of the SVC. The bony thorax exhibits no acute abnormality. IMPRESSION: Chronic bronchitic-smoking related changes, stable. No acute pneumonia nor CHF. No mediastinal abnormality is observed. Electronically Signed   By: David  Martinique M.D.   On: 09/10/2017 12:33   Ct Angio Chest Pe W/cm &/or Wo Cm  Result Date: 09/10/2017 CLINICAL DATA:  Gastric cancer, shortness of breath, fever. EXAM: CT ANGIOGRAPHY CHEST WITH CONTRAST TECHNIQUE: Multidetector CT imaging of the chest was performed using the standard protocol during bolus administration of intravenous contrast. Multiplanar CT image reconstructions and MIPs were obtained to evaluate the vascular anatomy. CONTRAST:  161m ISOVUE-370 IOPAMIDOL (ISOVUE-370) INJECTION 76% COMPARISON:  10/21/2016. FINDINGS: Cardiovascular: Negative for pulmonary embolus. Right IJ Port-A-Cath terminates at the SVC RA junction. Heart size normal. No pericardial effusion. Mediastinum/Nodes: No pathologically enlarged mediastinal, hilar or axillary lymph nodes. Esophagus is grossly unremarkable. Lungs/Pleura: Moderate to severe centrilobular emphysema. There may be a small amount of fluid tracking along the minor fissure. Image quality is  degraded by respiratory motion. Peribronchovascular consolidation and nodularity in the lower lobes are new. Scattered subpleural lymph nodes along the fissures. 5 mm apical left upper lobe nodule, stable. Calcified granulomas. No pleural fluid. Debris seen in the left mainstem bronchus and left lower lobe bronchi. Upper Abdomen: Visualized portions of the liver, adrenal glands, kidneys, spleen, pancreas, stomach and bowel are grossly unremarkable. No upper abdominal adenopathy. Musculoskeletal: No worrisome lytic or sclerotic lesions. Review of the MIP images confirms the above findings. IMPRESSION: 1. Negative for pulmonary embolus. 2. New peribronchovascular consolidation and nodularity in both lower lobes, most indicative of an infectious bronchiolitis/bronchopneumonia. 3.  Emphysema (ICD10-J43.9). Electronically Signed   By: MLorin PicketM.D.   On: 09/10/2017 15:15   Ct Abdomen Pelvis W Contrast  Result Date: 09/10/2017 CLINICAL DATA:  Shortness of breath.  History gastric cancer. EXAM: CT ABDOMEN AND PELVIS WITH CONTRAST TECHNIQUE: Multidetector CT imaging of the abdomen and pelvis was performed using the standard protocol following bolus administration of intravenous contrast. CONTRAST:  1068mISOVUE-370 IOPAMIDOL (ISOVUE-370) INJECTION 76% COMPARISON:  06/22/2017. FINDINGS: Lower chest: No pleural effusion. Multiple new nodular densities are identified within both lung bases. Index nodule within the right base measures 1.4 cm, image 28/series 4. New from previous exam. Density within the posterior right lower lobe measures 1.1 cm, image 23 of series 4. Also new from previous exam. Anterior right lower lobe lung nodular density measures 0.9 cm, image 21 of series 4. New from previous exam. Hepatobiliary: No focal liver abnormality. The gallbladder appears normal. No biliary dilatation. Pancreas: Normal appearance of the pancreas. Spleen: The spleen is normal. Adrenals/Urinary Tract: The adrenal glands  are unremarkable. Normal appearance of both kidneys. The urinary bladder is normal. Stomach/Bowel: Previous gastric mass is inconspicuous on today's study. The small bowel  loops have a normal course and caliber. The appendix is visualized and appears normal. No pathologic dilatation of the colon. Vascular/Lymphatic: Aortic atherosclerosis. No aneurysm. No upper abdominal adenopathy identified. 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 A stent is identified in the left common iliac artery. A femoral bypass graft is identified. Reproductive: Prostate is unremarkable. Other: No abdominal wall hernia or abnormality. No abdominopelvic ascites. Musculoskeletal: No aggressive lytic or sclerotic bone lesions. IMPRESSION: 1. No acute findings identified within the abdomen or pelvis. 2. Interval development of multiple bilateral pulmonary densities within both lower lobes. These are indeterminate, likely post infectious or inflammatory in etiology. Atypical infection not excluded. Metastatic disease is considered less favored but not entirely excluded and follow-up imaging to ensure resolution is advised. 3. No evidence to suggest recurrent tumor or metastatic disease within the abdomen or pelvis. 4.  Aortic Atherosclerosis (ICD10-I70.0). Electronically Signed   By: Kerby Moors M.D.   On: 09/10/2017 15:11    Pending Labs Unresulted Labs (From admission, onward)   Start     Ordered   09/11/17 0500  CBC with Differential/Platelet  Tomorrow morning,   R     09/10/17 2249   09/11/17 0500  Comprehensive metabolic panel  Tomorrow morning,   R     09/10/17 2249   09/11/17 0500  Magnesium  Tomorrow morning,   R     09/10/17 2249   09/11/17 0500  Phosphorus  Tomorrow morning,   R     09/10/17 2249   09/10/17 2234  Troponin I (q 6hr x 3)  Now then every 6 hours,   R     09/10/17 2233   09/10/17 1800  Troponin I  Add-on,   R     09/10/17 1759   09/10/17 1653  Urine culture  STAT,   STAT      09/10/17 1652   09/10/17 1651  Respiratory Panel by PCR  (Respiratory virus panel)  Once,   R     09/10/17 1651   09/10/17 1214  Blood Culture (routine x 2)  BLOOD CULTURE X 2,   STAT     09/10/17 1215   Signed and Held  HIV antibody  Once,   R     Signed and Held   Signed and Held  Culture, sputum-assessment  Once,   R     Signed and Held   Signed and Held  Gram stain  Once,   R     Signed and Held   Signed and Held  Strep pneumoniae urinary antigen  Once,   R     Signed and Held   Signed and Held  CBC  (enoxaparin (LOVENOX)    CrCl >/= 30 ml/min)  Once,   R    Comments:  Baseline for enoxaparin therapy IF NOT ALREADY DRAWN.  Notify MD if PLT < 100 K.    Signed and Held   Signed and Held  Creatinine, serum  (enoxaparin (LOVENOX)    CrCl >/= 30 ml/min)  Once,   R    Comments:  Baseline for enoxaparin therapy IF NOT ALREADY DRAWN.    Signed and Held   Signed and Held  Creatinine, serum  (enoxaparin (LOVENOX)    CrCl >/= 30 ml/min)  Weekly,   R    Comments:  while on enoxaparin therapy    Signed and Held   Signed and Held  TSH  Once,   R  Signed and Held   Signed and Held  Legionella Pneumophila Serogp 1 Ur Ag  Once,   R     Signed and Held   Signed and Held  Comprehensive metabolic panel  Tomorrow morning,   R     Signed and Held   Signed and Held  CBC WITH DIFFERENTIAL  Tomorrow morning,   R     Signed and Held      Vitals/Pain Today's Vitals   09/10/17 2024 09/10/17 2100 09/10/17 2200 09/10/17 2323  BP: 140/84 113/75 132/76   Pulse: 97 79 70   Resp: _0 Temp: 97.8 F (36.6 C)     TempSrc: Oral     SpO2: 98% 97% 98%   PainSc:    0-No pain    Isolation Precautions Droplet precaution  Medications Medications  iopamidol (ISOVUE-300) 61 % injection (not administered)  sodium chloride 0.9 % injection (not administered)  potassium chloride 10 mEq in 100 mL IVPB (not administered)  atorvastatin (LIPITOR) tablet 10 mg (not administered)  clopidogrel  (PLAVIX) tablet 75 mg (not administered)  piperacillin-tazobactam (ZOSYN) IVPB 3.375 g (0 g Intravenous Stopped 09/10/17 1300)  vancomycin (VANCOCIN) IVPB 1000 mg/200 mL premix (0 mg Intravenous Stopped 09/10/17 1436)  sodium chloride 0.9 % bolus 1,000 mL (0 mLs Intravenous Stopped 09/10/17 1308)  albuterol (PROVENTIL) (2.5 MG/3ML) 0.083% nebulizer solution 5 mg (5 mg Nebulization Given 09/10/17 1455)  iopamidol (ISOVUE-370) 76 % injection (100 mLs  Contrast Given 09/10/17 1416)  furosemide (LASIX) injection 20 mg (20 mg Intravenous Given 09/10/17 1436)  famotidine (PEPCID) IVPB 20 mg premix (0 mg Intravenous Stopped 09/10/17 1510)  ondansetron (ZOFRAN) injection 4 mg (4 mg Intravenous Given 09/10/17 1702)    Mobility walks with person assist

## 2017-09-11 ENCOUNTER — Other Ambulatory Visit: Payer: Self-pay

## 2017-09-11 ENCOUNTER — Inpatient Hospital Stay (HOSPITAL_COMMUNITY): Payer: BLUE CROSS/BLUE SHIELD

## 2017-09-11 DIAGNOSIS — R1310 Dysphagia, unspecified: Secondary | ICD-10-CM

## 2017-09-11 LAB — RESPIRATORY PANEL BY PCR
ADENOVIRUS-RVPPCR: NOT DETECTED
Adenovirus: NOT DETECTED
BORDETELLA PERTUSSIS-RVPCR: NOT DETECTED
BORDETELLA PERTUSSIS-RVPCR: NOT DETECTED
CHLAMYDOPHILA PNEUMONIAE-RVPPCR: NOT DETECTED
CHLAMYDOPHILA PNEUMONIAE-RVPPCR: NOT DETECTED
CORONAVIRUS 229E-RVPPCR: NOT DETECTED
CORONAVIRUS HKU1-RVPPCR: NOT DETECTED
CORONAVIRUS HKU1-RVPPCR: NOT DETECTED
CORONAVIRUS NL63-RVPPCR: NOT DETECTED
Coronavirus 229E: NOT DETECTED
Coronavirus NL63: NOT DETECTED
Coronavirus OC43: NOT DETECTED
Coronavirus OC43: NOT DETECTED
INFLUENZA B-RVPPCR: NOT DETECTED
Influenza A: NOT DETECTED
Influenza A: NOT DETECTED
Influenza B: NOT DETECTED
Metapneumovirus: NOT DETECTED
Metapneumovirus: NOT DETECTED
Mycoplasma pneumoniae: NOT DETECTED
Mycoplasma pneumoniae: NOT DETECTED
PARAINFLUENZA VIRUS 3-RVPPCR: NOT DETECTED
Parainfluenza Virus 1: NOT DETECTED
Parainfluenza Virus 1: NOT DETECTED
Parainfluenza Virus 2: NOT DETECTED
Parainfluenza Virus 2: NOT DETECTED
Parainfluenza Virus 3: NOT DETECTED
Parainfluenza Virus 4: NOT DETECTED
Parainfluenza Virus 4: NOT DETECTED
RESPIRATORY SYNCYTIAL VIRUS-RVPPCR: NOT DETECTED
RHINOVIRUS / ENTEROVIRUS - RVPPCR: NOT DETECTED
Respiratory Syncytial Virus: NOT DETECTED
Rhinovirus / Enterovirus: NOT DETECTED

## 2017-09-11 LAB — COMPREHENSIVE METABOLIC PANEL
ALBUMIN: 2.8 g/dL — AB (ref 3.5–5.0)
ALK PHOS: 74 U/L (ref 38–126)
ALT: 20 U/L (ref 17–63)
ANION GAP: 7 (ref 5–15)
AST: 14 U/L — ABNORMAL LOW (ref 15–41)
BILIRUBIN TOTAL: 0.7 mg/dL (ref 0.3–1.2)
BUN: 6 mg/dL (ref 6–20)
CALCIUM: 8.3 mg/dL — AB (ref 8.9–10.3)
CO2: 32 mmol/L (ref 22–32)
Chloride: 98 mmol/L — ABNORMAL LOW (ref 101–111)
Creatinine, Ser: 0.42 mg/dL — ABNORMAL LOW (ref 0.61–1.24)
GFR calc non Af Amer: >60 mL/min (ref >60)
GLUCOSE: 116 mg/dL — AB (ref 65–99)
POTASSIUM: 5.1 mmol/L (ref 3.5–5.1)
SODIUM: 137 mmol/L (ref 135–145)
TOTAL PROTEIN: 5.9 g/dL — AB (ref 6.5–8.1)

## 2017-09-11 LAB — CREATININE, SERUM: CREATININE: 0.52 mg/dL — AB (ref 0.61–1.24)

## 2017-09-11 LAB — TROPONIN I

## 2017-09-11 LAB — CBC WITH DIFFERENTIAL/PLATELET
BASOS ABS: 0 10*3/uL (ref 0.0–0.1)
BASOS PCT: 0 %
Eosinophils Absolute: 0.1 10*3/uL (ref 0.0–0.7)
Eosinophils Relative: 1 %
HEMATOCRIT: 41.2 % (ref 39.0–52.0)
HEMOGLOBIN: 12.8 g/dL — AB (ref 13.0–17.0)
LYMPHS PCT: 4 %
Lymphs Abs: 0.4 10*3/uL — ABNORMAL LOW (ref 0.7–4.0)
MCH: 27.8 pg (ref 26.0–34.0)
MCHC: 31.1 g/dL (ref 30.0–36.0)
MCV: 89.4 fL (ref 78.0–100.0)
Monocytes Absolute: 0 10*3/uL — ABNORMAL LOW (ref 0.1–1.0)
Monocytes Relative: 0 %
NEUTROS ABS: 9.7 10*3/uL — AB (ref 1.7–7.7)
NEUTROS PCT: 95 %
Platelets: 214 10*3/uL (ref 150–400)
RBC: 4.61 MIL/uL (ref 4.22–5.81)
RDW: 16.3 % — ABNORMAL HIGH (ref 11.5–15.5)
WBC: 10.2 10*3/uL (ref 4.0–10.5)

## 2017-09-11 LAB — CBC
HEMATOCRIT: 39.8 % (ref 39.0–52.0)
HEMOGLOBIN: 12.5 g/dL — AB (ref 13.0–17.0)
MCH: 28.3 pg (ref 26.0–34.0)
MCHC: 31.4 g/dL (ref 30.0–36.0)
MCV: 90 fL (ref 78.0–100.0)
Platelets: 200 10*3/uL (ref 150–400)
RBC: 4.42 MIL/uL (ref 4.22–5.81)
RDW: 16.2 % — ABNORMAL HIGH (ref 11.5–15.5)
WBC: 7.9 10*3/uL (ref 4.0–10.5)

## 2017-09-11 LAB — MRSA PCR SCREENING: MRSA by PCR: NEGATIVE

## 2017-09-11 LAB — HIV ANTIBODY (ROUTINE TESTING W REFLEX): HIV SCREEN 4TH GENERATION: NONREACTIVE

## 2017-09-11 LAB — STREP PNEUMONIAE URINARY ANTIGEN: STREP PNEUMO URINARY ANTIGEN: NEGATIVE

## 2017-09-11 LAB — TSH: TSH: 1.306 u[IU]/mL (ref 0.350–4.500)

## 2017-09-11 MED ORDER — POTASSIUM CHLORIDE 10 MEQ/100ML IV SOLN
10.0000 meq | INTRAVENOUS | Status: AC
  Administered 2017-09-11 (×3): 10 meq via INTRAVENOUS
  Filled 2017-09-11 (×3): qty 100

## 2017-09-11 MED ORDER — DIAZEPAM 5 MG PO TABS: 5.0000 mg | ORAL_TABLET | Freq: Three times a day (TID) | ORAL | Status: DC | PRN

## 2017-09-11 NOTE — Progress Notes (Signed)
Pt is resting comfortably in room. Have used the video interpreter service to communicate with patient today. Will continue to monitor.

## 2017-09-11 NOTE — Progress Notes (Signed)
CPT not done at this time due to patient not being in room.

## 2017-09-11 NOTE — Evaluation (Signed)
Clinical/Bedside Swallow Evaluation Patient Details  Name: Bobby Caldwell MRN: [id number] Date of Birth: 1955-11-11  Today's Date: 09/11/2017 Time: SLP Start Time (ACUTE ONLY): 0825 SLP Stop Time (ACUTE ONLY): 0855 SLP Time Calculation (min) (ACUTE ONLY): 30 min  Past Medical History:  Past Medical History:  Diagnosis Date  . COPD (chronic obstructive pulmonary disease) ([place])   . Dyspnea   . PAD (peripheral artery disease) ([place])   . Stomach cancer ([place])   . Tobacco abuse   . Weight loss    Past Surgical History:  Past Surgical History:  Procedure Laterality Date  . ABDOMINAL AORTOGRAM W/LOWER EXTREMITY N/A 05/03/2017   Procedure: ABDOMINAL AORTOGRAM W/LOWER EXTREMITY;  Surgeon: Conrad Decatur, MD;  Location: [facility];  Service: Cardiovascular;  Laterality: N/A;  . ESOPHAGOGASTRODUODENOSCOPY     05/24/17  . ESOPHAGOGASTRODUODENOSCOPY (EGD) WITH PROPOFOL N/A 05/24/2017   Procedure: ESOPHAGOGASTRODUODENOSCOPY (EGD) WITH PROPOFOL;  Surgeon: [name], MD;  Location: [facility];  Service: Endoscopy;  Laterality: N/A;  . EUS N/A 06/07/2017   Procedure: UPPER ENDOSCOPIC ULTRASOUND (EUS) RADIAL;  Surgeon: [name], MD;  Location: [facility];  Service: Endoscopy;  Laterality: N/A;  . FEMORAL-FEMORAL BYPASS GRAFT Bilateral 05/08/2017   Procedure: LEFT TO RIGHT BYPASS GRAFT FEMORAL-FEMORAL ARTERY;  Surgeon: Conrad Lucas, MD;  Location: [facility];  Service: Vascular;  Laterality: Bilateral;  . IR FLUORO GUIDE PORT INSERTION RIGHT  07/05/2017  . IR US GUIDE VASC ACCESS RIGHT  07/05/2017  . NM MYOVIEW LTD  06/2017   LOW RISK. Small basal inferoseptal defect thought to be related to artifact (although cannot exclude prior infarct). Normal wall motion i in this area would suggest artifact not infarct. Otherwise no ischemia or infarction.  Marland Kitchen PERIPHERAL VASCULAR INTERVENTION Left 05/03/2017   Procedure: PERIPHERAL VASCULAR INTERVENTION;  Surgeon: Conrad Sarah Ann, MD;  Location: [facility];  Service: Cardiovascular;  Laterality: Left;  common iliac  . TRANSTHORACIC ECHOCARDIOGRAM  06/26/2017   Normal LV size and function. EF 60-65%. No wall motion abnormalities. GR 1 DD. 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 Per chart pt states he has no appetite and that his food feels like it gets stuck in his chest causing chest discomfort but is able to get it down eventually. CXR chronic bronchitic-smoking related changes, stable. No acute pneumonia nor CHF. MD admitted for pna.    Assessment / Plan / Recommendation Clinical Impression  Assessment completed with use of video pad interpreter. Pt exhibits a suspected primary esophageal dysphagia marked by facial grimace during and post swallow, pointing to chest stating "it feels here", multiple swallows and observed regurgitation into oral cavity with one of 4 trials applesauce. No cough, wet vocal quality but delayed throat clear with possibility of LPR (laryngopharyngeal reflux). Agree with full liquids diet for ease of esophageal transit and pt affirms solids are more difficult for him. Recommend further esophageal work up with GI or barium esophagram. Educated pt to stay on liquids, sit upright, small sips,  remain upright minimum 45 minutes after meals. ST will follow up while hospitalized SLP Visit Diagnosis: Dysphagia, pharyngoesophageal phase (R13.14)    Aspiration Risk  Moderate aspiration risk    Diet Recommendation Thin liquid;Other (Comment)(full liquids)   Liquid Administration  via: Cup;Straw Medication Administration: Whole meds with liquid Supervision: Patient able to self feed Compensations: Slow rate;Small sips/bites;Follow solids with  liquid;Clear throat intermittently;Multiple dry swallows after each bite/sip Postural Changes: Seated upright at 90 degrees;Remain upright for at least 30 minutes after po intake    Other  Recommendations Oral Care Recommendations: Oral care BID   Follow up Recommendations Home health SLP      Frequency and Duration min 2x/week  2 weeks       Prognosis Prognosis for Safe Diet Advancement: (fair-good)      Swallow Study   General HPI: Bobby Caldwell a 62 y.o.Albaninan speakingmalefrom Kosovowith medical history significant ofCOPD, Gastric Adenocarcinoma on Chemotherapy, PAD s/p Fem-Fem Bypass, Tobacco Abuse and other comorbids who presented to the ED with worsening cough and shortness of breath along with a fever. Per chart pt states he has no appetite and that his food feels like it gets stuck in his chest causing chest discomfort but is able to get it down eventually. CXR chronic bronchitic-smoking related changes, stable. No acute pneumonia nor CHF. MD admitted for pna.  Type of Study: Bedside Swallow Evaluation Previous Swallow Assessment: no Diet Prior to this Study: Thin liquids(full liquid) Temperature Spikes Noted: No Respiratory Status: Nasal cannula History of Recent Intubation: No Behavior/Cognition: Alert;Cooperative;Pleasant mood;Requires cueing Oral Cavity Assessment: Within Functional Limits Oral Care Completed by SLP: No Oral Cavity - Dentition: Adequate natural dentition Vision: Functional for self-feeding Self-Feeding Abilities: Able to feed self Patient Positioning: Upright in bed Baseline Vocal Quality: Normal Volitional Cough: Strong Volitional Swallow: Able to elicit    Oral/Motor/Sensory Function Overall Oral Motor/Sensory Function: Within functional limits   Ice Chips Ice chips: Not tested   Thin Liquid Thin Liquid: Impaired Presentation: Straw;Cup Pharyngeal  Phase Impairments: Other (comments);Throat Clearing - Delayed;Multiple swallows(facial  grimace )    Nectar Thick Nectar Thick Liquid: Not tested   Honey Thick Honey Thick Liquid: Not tested   Puree Puree: Impaired Presentation: Self Fed;Spoon Pharyngeal Phase Impairments: Other (comments);Multiple swallows(grimacing)   Solid   GO   Solid: Not tested        Houston Siren 09/11/2017,9:15 AM  Orbie Pyo Colvin Caroli.Ed [employer] [phone]

## 2017-09-11 NOTE — Progress Notes (Signed)
Patient arrived to unit, patient says he "speaks [place]", RN tried to contact interpreter several times without success. Asked patient as many questions as I could and confirmed if he understood. Patient did state that he still smokes, wears oxygen, and feels like something is stuck in his chest when he swallows.

## 2017-09-11 NOTE — Evaluation (Signed)
Physical Therapy Evaluation Patient Details Name: Carless Slatten MRN: [id number] DOB: May 25, 1956 Today's Date: 09/11/2017   History of Present Illness  Pt is a 62 y.o. Albanian-speaking male admitted 09/10/17 with worsening SOB, cough, fever; worked up for CAP. CXR indicative of an infectious bronchiolitis/bronchopneumonia, as well as emphysema. PMH includes gastric adenocarcinoma on chemo, PAD, COPD, tobacco abuse.     Clinical Impression  Pt presents with decreased activity tolerance, generalized weakness, and an overall decrease in functional mobility secondary to above. PTA, pt able to amb short distances but limited by fatigue; wife provides intermittent assist for ADLs. Today, pt able to amb to chair with supervision for safety; declining further mobility secondary to fatigue. Pt would benefit from continued acute PT services to maximize functional mobility and independence prior to d/c with HHPT/OT services. See below for DME recommendations.     Follow Up Recommendations Home health PT;Supervision for mobility/OOB    Equipment Recommendations  Wheelchair (measurements PT);Wheelchair cushion (measurements PT);3in1 (PT);Rolling walker with 5" wheels    Recommendations for Other Services       Precautions / Restrictions Precautions Precautions: Fall Restrictions Weight Bearing Restrictions: No      Mobility  Bed Mobility Overal bed mobility: Independent                Transfers Overall transfer level: Needs assistance Equipment used: None Transfers: Sit to/from Stand Sit to Stand: Supervision            Ambulation/Gait Ambulation/Gait assistance: Supervision Ambulation Distance (Feet): 5 Feet Assistive device: None Gait Pattern/deviations: Step-to pattern;Trunk flexed Gait velocity: Decreased Gait velocity interpretation: <1.8 ft/sec, indicative of risk for recurrent falls General Gait Details: Slow, controlled amb from bed to chair with supervision for safety.  Pt declining further distance secondary to fatigue  Stairs            Wheelchair Mobility    Modified Rankin (Stroke Patients Only)       Balance Overall balance assessment: Needs assistance   Sitting balance-Leahy Scale: Good Sitting balance - Comments: Indep to don socks sitting EOB     Standing balance-Leahy Scale: Fair                               Pertinent Vitals/Pain Pain Assessment: No/denies pain Faces Pain Scale: Hurts little more Pain Intervention(s): Limited activity within patient's tolerance    Home Living Family/patient expects to be discharged to:: Private residence Living Arrangements: Spouse/significant other Available Help at Discharge: Family;Available 24 hours/day Type of Home: House Home Access: Stairs to enter Entrance Stairs-Rails: Right Entrance Stairs-Number of Steps: 3 Home Layout: One level Home Equipment: None      Prior Function Level of Independence: Needs assistance   Gait / Transfers Assistance Needed: Pt reports has been indep with all mobility until recently (unclear if this was with start of chemo). Now limited to amb very short distances due to fatigue/weakness  ADL's / Homemaking Assistance Needed: Wife assists for bathing        Hand Dominance        Extremity/Trunk Assessment   Upper Extremity Assessment Upper Extremity Assessment: Generalized weakness    Lower Extremity Assessment Lower Extremity Assessment: Generalized weakness       Communication   Communication: Prefers language other than [place];Interpreter utilized([place]ian; phone interpreter)  Cognition Arousal/Alertness: Awake/alert Behavior During Therapy: WFL for tasks assessed/performed Overall Cognitive Status: Within Functional Limits for tasks assessed  General Comments General comments (skin integrity, edema, etc.): Phone interpreter utilized    Exercises      Assessment/Plan    PT Assessment Patient needs continued PT services  PT Problem List Decreased strength;Decreased activity tolerance;Decreased balance;Decreased mobility;Decreased knowledge of use of DME       PT Treatment Interventions DME instruction;Gait training;Stair training;Functional mobility training;Therapeutic activities;Therapeutic exercise;Balance training;Patient/family education;Wheelchair mobility training    PT Goals (Current goals can be found in the Care Plan section)  Acute Rehab PT Goals Patient Stated Goal: Return home PT Goal Formulation: With patient Time For Goal Achievement: 09/25/17 Potential to Achieve Goals: Good    Frequency Min 3X/week   Barriers to discharge        Co-evaluation               AM-PAC PT "6 Clicks" Daily Activity  Outcome Measure Difficulty turning over in bed (including adjusting bedclothes, sheets and blankets)?: None Difficulty moving from lying on back to sitting on the side of the bed? : None Difficulty sitting down on and standing up from a chair with arms (e.g., wheelchair, bedside commode, etc,.)?: A Little Help needed moving to and from a bed to chair (including a wheelchair)?: A Little Help needed walking in hospital room?: A Little Help needed climbing 3-5 steps with a railing? : A Little 6 Click Score: 20    End of Session Equipment Utilized During Treatment: Oxygen Activity Tolerance: Patient limited by fatigue Patient left: in chair;with call bell/phone within reach;Other (comment)(RN aware of chair alarm malfunction) Nurse Communication: Mobility status;Other (comment)(RN to call maintenance to fix chair alarm) PT Visit Diagnosis: Other abnormalities of gait and mobility (R26.89);Muscle weakness (generalized) (M62.81)    Time: 1696-7893 PT Time Calculation (min) (ACUTE ONLY): 13 min   Charges:   PT Evaluation $PT Eval Moderate Complexity: 1 Mod     PT G Codes:       Mabeline Caras, PT,  DPT Acute Rehab Services  Pager: [place] 09/11/2017, 12:02 PM

## 2017-09-11 NOTE — Progress Notes (Signed)
Called Dr. Lianne Moris office for patient.  Left a message with receptionist that pt will miss his appointment tomorrow. Dr. Loleta Books is going to follow up with them tomorrow.

## 2017-09-11 NOTE — Progress Notes (Signed)
Call from radiology asking to D/C 1 of 2 CXR orders. RN D/C'd portable chest xray.

## 2017-09-11 NOTE — Progress Notes (Signed)
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  CXR showed nothing but CT showed bilateral pneumonia.  Was started on antibiotics and steroids for COPD.  Speech consulted for swallowing.      Assessment & Plan:  Acute on chronic respiratory failure with hypoxia Community-acquired pneumonia, bilateral lower lobes COPD with exacerbation RVP panel negative, lactate cleared.  PSI 160. -Continue ceftriaxone and azithromycin -Flutter valve - Obtain sputum culture -Continue Solu-Medrol -Continue nebs scheduled and as needed -Continue supplemental oxygen   Dysphagia -Consult SLP -Barium esophogram today  Gastric adenocarcinoma -Continue dronabinol  Hypokalemia -Replete  -Trend BMP  Peripheral vascular disease -Continue Plavix, statin  Chest pain No active chest pain.  Patient reports no exertional symptoms.  ECG personally reviewed, no dynamic changes overnight, but new infero-anterior T wave inversions.   -Cycle troponins.  Severe protein calorie malnutrition From cancer.    DVT prophylaxis: Lovenox Code Status: FULL Family Communication: None present MDM and disposition Plan: The below labs and imaging reports were reviewed.  The patient's status is clinically stable.   PSI class V, high risk.  Will need inpatient treatment with IV antibiotics, attempt to wean O2 to home level.  Also will need steroids and PT evaluation.  Home when HR improved, taking PO, afebrile, and dysphagia evaluation is complete.     Consultants:   SLP  Procedures:   Barium esophogram  Antimicrobials:   Ceftriaxone 3/4 >>  Azithromycin 3/4 >>    Subjective: All history collected through telephonic interpreter.   Patient feeling worse, generalized malaise, cough.  Dyspnea persists.  No more fever.  No chest discomfort.    Objective: Vitals:   09/11/17 0906 09/11/17 0910 09/11/17 1230 09/11/17 1433  BP:   94/88   Pulse:   85   Resp:   18   Temp:   98.4 F (36.9 C)   TempSrc:   Oral   SpO2: 96% 96% 97% 97%  Weight:      Height:        Intake/Output Summary (Last 24 hours) at 09/11/2017 1627 Last data filed at 09/11/2017 1500 Gross per 24 hour  Intake 1201.67 ml  Output 2055 ml  Net -853.33 ml   Filed Weights   09/10/17 2331 09/11/17 0433  Weight: 52.5 kg (115 lb 11.9 oz) 53.1 kg (117 lb 1 oz)    Examination: General appearance: Thin adult male, alert and in no acute distress, appears tired.   HEENT: Anicteric, conjunctiva pink, lids and lashes normal. No nasal deformity, discharge, epistaxis.  Lips moist.   Skin: Warm and dry.  No jaundice.  No suspicious rashes or lesions. Cardiac: RRR, nl S1-S2, no murmurs appreciated.  Capillary refill is brisk.  JVP normal.  No LE edema.  Radial pulses 2+ and symmetric. Respiratory: Tachypneic, very coarse bilaterally with heavy wheezing. Abdomen: Abdomen soft.  No TTP. No ascites, distension, hepatosplenomegaly.   MSK: No deformities or effusions.  Diffuse loss of muscle mass and subcutaneous fat. Neuro: Awake and alert.  EOMI, moves all extremities. Speech fluent.    Psych: Sensorium intact and responding to questions, attention normal. Affect normal.  Judgment and insight appear normal.    Data Reviewed: I have personally reviewed following  labs and imaging studies:  CBC: Recent Labs  Lab 09/10/17 1150 09/10/17 2338 09/11/17 0429  WBC 8.4 7.9 10.2  NEUTROABS 6.5  --  9.7*  HGB 12.9* 12.5* 12.8*  HCT 40.4 39.8 41.2  MCV 89.4 90.0 89.4  PLT 227 200 324   Basic Metabolic Panel: Recent Labs  Lab 09/10/17 1150 09/10/17 2338 09/11/17 0429  NA 142  --  137  K 3.4*  --  5.1  CL 99*  --  98*  CO2 34*  --  32  GLUCOSE 145*  --  116*    BUN 12  --  6  CREATININE 0.48* 0.52* 0.42*  CALCIUM 8.0*  --  8.3*   GFR: Estimated Creatinine Clearance: 73.8 mL/min (A) (by C-G formula based on SCr of 0.42 mg/dL (L)). Liver Function Tests: Recent Labs  Lab 09/10/17 1150 09/11/17 0429  AST 18 14*  ALT 20 20  ALKPHOS 73 74  BILITOT 0.6 0.7  PROT 5.6* 5.9*  ALBUMIN 2.9* 2.8*   No results for input(s): LIPASE, AMYLASE in the last 168 hours. No results for input(s): AMMONIA in the last 168 hours. Coagulation Profile: No results for input(s): INR, PROTIME in the last 168 hours. Cardiac Enzymes: Recent Labs  Lab 09/11/17 1016  TROPONINI <0.03   BNP (last 3 results) Recent Labs    03/26/17 1040  PROBNP 25.0   HbA1C: No results for input(s): HGBA1C in the last 72 hours. CBG: No results for input(s): GLUCAP in the last 168 hours. Lipid Profile: No results for input(s): CHOL, HDL, LDLCALC, TRIG, CHOLHDL, LDLDIRECT in the last 72 hours. Thyroid Function Tests: Recent Labs    09/10/17 2338  TSH 1.306   Anemia Panel: No results for input(s): VITAMINB12, FOLATE, FERRITIN, TIBC, IRON, RETICCTPCT in the last 72 hours. Urine analysis:    Component Value Date/Time   COLORURINE STRAW (A) 09/10/2017 1258   APPEARANCEUR CLEAR 09/10/2017 1258   LABSPEC 1.006 09/10/2017 1258   PHURINE 8.0 09/10/2017 1258   GLUCOSEU NEGATIVE 09/10/2017 1258   HGBUR NEGATIVE 09/10/2017 [place] 09/10/2017 1258   KETONESUR NEGATIVE 09/10/2017 1258   PROTEINUR NEGATIVE 09/10/2017 1258   NITRITE NEGATIVE 09/10/2017 1258   LEUKOCYTESUR NEGATIVE 09/10/2017 1258   Sepsis Labs: @LABRCNTIP (procalcitonin:4,lacticacidven:4)  ) Recent Results (from the past 240 hour(s))  Blood Culture (routine x 2)     Status: None (Preliminary result)   Collection Time: 09/10/17 12:31 PM  Result Value Ref Range Status   Specimen Description   Final    BLOOD LEFT ARM Performed at [facility], [place] [address]  [place], [place] [address]    Special Requests   Final    IN PEDIATRIC BOTTLE Blood Culture adequate volume Performed at [facility] [address] [place], [place] [address]    Culture   Final    NO GROWTH < 24 HOURS Performed at [facility] [address], [place], [place] [address]    Report Status PENDING  Incomplete  Blood Culture (routine x 2)     Status: None (Preliminary result)   Collection Time: 09/10/17 12:58 PM  Result Value Ref Range Status   Specimen Description   Final    BLOOD LEFT HAND Performed at [facility] [address], [place], [place] [address]    Special Requests   Final    BOTTLES DRAWN AEROBIC AND ANAEROBIC Blood Culture adequate volume Performed at [facility]  [address] [place], [place] [address]    Culture   Final    NO GROWTH < 24 HOURS Performed at [facility] [address], [place], [place] [address]    Report Status PENDING  Incomplete  Respiratory Panel by PCR     Status: None   Collection Time: 09/10/17 12:58 PM  Result Value Ref Range Status   Adenovirus NOT DETECTED NOT DETECTED Final   Coronavirus 229E NOT DETECTED NOT DETECTED Final   Coronavirus HKU1 NOT DETECTED NOT DETECTED Final   Coronavirus NL63 NOT DETECTED NOT DETECTED Final   Coronavirus OC43 NOT DETECTED NOT DETECTED Final   Metapneumovirus NOT DETECTED NOT DETECTED Final   Rhinovirus / Enterovirus NOT DETECTED NOT DETECTED Final   Influenza A NOT DETECTED NOT DETECTED Final   Influenza B NOT DETECTED NOT DETECTED Final   Parainfluenza Virus 1 NOT DETECTED NOT DETECTED Final   Parainfluenza Virus 2 NOT DETECTED NOT DETECTED Final   Parainfluenza Virus 3 NOT DETECTED NOT DETECTED Final   Parainfluenza Virus 4 NOT DETECTED NOT DETECTED Final   Respiratory Syncytial Virus NOT DETECTED NOT DETECTED Final   Bordetella pertussis NOT DETECTED NOT DETECTED Final   Chlamydophila pneumoniae NOT DETECTED  NOT DETECTED Final   Mycoplasma pneumoniae NOT DETECTED NOT DETECTED Final  Respiratory Panel by PCR     Status: None   Collection Time: 09/10/17 11:31 PM  Result Value Ref Range Status   Adenovirus NOT DETECTED NOT DETECTED Final   Coronavirus 229E NOT DETECTED NOT DETECTED Final   Coronavirus HKU1 NOT DETECTED NOT DETECTED Final   Coronavirus NL63 NOT DETECTED NOT DETECTED Final   Coronavirus OC43 NOT DETECTED NOT DETECTED Final   Metapneumovirus NOT DETECTED NOT DETECTED Final   Rhinovirus / Enterovirus NOT DETECTED NOT DETECTED Final   Influenza A NOT DETECTED NOT DETECTED Final   Influenza B NOT DETECTED NOT DETECTED Final   Parainfluenza Virus 1 NOT DETECTED NOT DETECTED Final   Parainfluenza Virus 2 NOT DETECTED NOT DETECTED Final   Parainfluenza Virus 3 NOT DETECTED NOT DETECTED Final   Parainfluenza Virus 4 NOT DETECTED NOT DETECTED Final   Respiratory Syncytial Virus NOT DETECTED NOT DETECTED Final   Bordetella pertussis NOT DETECTED NOT DETECTED Final   Chlamydophila pneumoniae NOT DETECTED NOT DETECTED Final   Mycoplasma pneumoniae NOT DETECTED NOT DETECTED Final    Comment: Performed at [facility], [place] [address], [place], [place] [address]  MRSA PCR Screening     Status: None   Collection Time: 09/11/17 10:44 AM  Result Value Ref Range Status   MRSA by PCR NEGATIVE NEGATIVE Final    Comment:        The GeneXpert MRSA Assay (FDA approved for NASAL specimens only), is one component of a comprehensive MRSA colonization surveillance program. It is not intended to diagnose MRSA infection nor to guide or monitor treatment for MRSA infections. Performed at [facility], [place] [address], [place], [place] [address]          Radiology Studies: Dg Chest 1 View  Result Date: 09/10/2017 CLINICAL DATA:  Sensation of food being stuck in the chest. EXAM: CHEST 1 VIEW COMPARISON:  Chest x-ray of May 08, 2017 FINDINGS: The lungs remain hyperinflated. There  is no focal infiltrate. There is no pleural effusion. The heart and pulmonary vascularity are normal. The mediastinum is normal in width. The trachea is midline. The porta catheter tip projects over the distal third of the SVC.  The bony thorax exhibits no acute abnormality. IMPRESSION: Chronic bronchitic-smoking related changes, stable. No acute pneumonia nor CHF. No mediastinal abnormality is observed. Electronically Signed   By: David  Martinique M.D.   On: 09/10/2017 12:33   Dg Chest 2 View  Result Date: 09/11/2017 CLINICAL DATA:  Pneumonia, COPD, smoker EXAM: CHEST - 2 VIEW COMPARISON:  09/10/2017 FINDINGS: RIGHT jugular Port-A-Cath with tip projecting over SVC. Normal heart size, mediastinal contours, and pulmonary vascularity. Emphysematous and bronchitic changes consistent with COPD. Bibasilar atelectasis. Lungs clear. No definite acute infiltrate, pleural effusion, or pneumothorax. Bones demineralized. IMPRESSION: COPD changes with bibasilar atelectasis. No acute infiltrate. Electronically Signed   By: Lavonia Dana M.D.   On: 09/11/2017 15:42   Ct Angio Chest Pe W/cm &/or Wo Cm  Result Date: 09/10/2017 CLINICAL DATA:  Gastric cancer, shortness of breath, fever. EXAM: CT ANGIOGRAPHY CHEST WITH CONTRAST TECHNIQUE: Multidetector CT imaging of the chest was performed using the standard protocol during bolus administration of intravenous contrast. Multiplanar CT image reconstructions and MIPs were obtained to evaluate the vascular anatomy. CONTRAST:  122mL ISOVUE-370 IOPAMIDOL (ISOVUE-370) INJECTION 76% COMPARISON:  10/21/2016. FINDINGS: Cardiovascular: Negative for pulmonary embolus. Right IJ Port-A-Cath terminates at the SVC RA junction. Heart size normal. No pericardial effusion. Mediastinum/Nodes: No pathologically enlarged mediastinal, hilar or axillary lymph nodes. Esophagus is grossly unremarkable. Lungs/Pleura: Moderate to severe centrilobular emphysema. There may be a small amount of fluid tracking  along the minor fissure. Image quality is degraded by respiratory motion. Peribronchovascular consolidation and nodularity in the lower lobes are new. Scattered subpleural lymph nodes along the fissures. 5 mm apical left upper lobe nodule, stable. Calcified granulomas. No pleural fluid. Debris seen in the left mainstem bronchus and left lower lobe bronchi. Upper Abdomen: Visualized portions of the liver, adrenal glands, kidneys, spleen, pancreas, stomach and bowel are grossly unremarkable. No upper abdominal adenopathy. Musculoskeletal: No worrisome lytic or sclerotic lesions. Review of the MIP images confirms the above findings. IMPRESSION: 1. Negative for pulmonary embolus. 2. New peribronchovascular consolidation and nodularity in both lower lobes, most indicative of an infectious bronchiolitis/bronchopneumonia. 3.  Emphysema (ICD10-J43.9). Electronically Signed   By: Lorin Picket M.D.   On: 09/10/2017 15:15   Ct Abdomen Pelvis W Contrast  Result Date: 09/10/2017 CLINICAL DATA:  Shortness of breath.  History gastric cancer. EXAM: CT ABDOMEN AND PELVIS WITH CONTRAST TECHNIQUE: Multidetector CT imaging of the abdomen and pelvis was performed using the standard protocol following bolus administration of intravenous contrast. CONTRAST:  160mL ISOVUE-370 IOPAMIDOL (ISOVUE-370) INJECTION 76% COMPARISON:  06/22/2017. FINDINGS: Lower chest: No pleural effusion. Multiple new nodular densities are identified within both lung bases. Index nodule within the right base measures 1.4 cm, image 28/series 4. New from previous exam. Density within the posterior right lower lobe measures 1.1 cm, image 23 of series 4. Also new from previous exam. Anterior right lower lobe lung nodular density measures 0.9 cm, image 21 of series 4. New from previous exam. Hepatobiliary: No focal liver abnormality. The gallbladder appears normal. No biliary dilatation. Pancreas: Normal appearance of the pancreas. Spleen: The spleen is normal.  Adrenals/Urinary Tract: The adrenal glands are unremarkable. Normal appearance of both kidneys. The urinary bladder is normal. Stomach/Bowel: Previous gastric mass is inconspicuous on today's study. The small bowel loops have a normal course and caliber. The appendix is visualized and appears normal. No pathologic dilatation of the colon. Vascular/Lymphatic: Aortic atherosclerosis. No aneurysm. No upper abdominal adenopathy identified. 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 A stent is identified in the left common iliac artery. A femoral bypass graft is identified. Reproductive: Prostate is unremarkable. Other: No abdominal wall hernia or abnormality. No abdominopelvic ascites. Musculoskeletal: No aggressive lytic or sclerotic bone lesions. IMPRESSION: 1. No acute findings identified within the abdomen or pelvis. 2. Interval development of multiple bilateral pulmonary densities within both lower lobes. These are indeterminate, likely post infectious or inflammatory in etiology. Atypical infection not excluded. Metastatic disease is considered less favored but not entirely excluded and follow-up imaging to ensure resolution is advised. 3. No evidence to suggest recurrent tumor or metastatic disease within the abdomen or pelvis. 4.  Aortic Atherosclerosis (ICD10-I70.0). Electronically Signed   By: Kerby Moors M.D.   On: 09/10/2017 15:11   Dg Esophagus  Result Date: 09/11/2017 CLINICAL DATA:  Sensation of food sticking in the throat. Patient reports focal left upper chest wall pain when eating. History of gastric adenocarcinoma. EXAM: ESOPHOGRAM/BARIUM SWALLOW TECHNIQUE: Single contrast examination was performed using  thin barium. FLUOROSCOPY TIME:  Fluoroscopy Time:  1.3 minutes Radiation Exposure Index (if provided by the fluoroscopic device): 2.2 mGy Number of Acquired Spot Images: 0 COMPARISON:  None. FINDINGS: Lateral pharyngeal imaging shows normal swallowing. There was no  aspiration or obstructive process. Esophagus has normal distensibility and smooth mucosal contour. A 13 mm barium tablet was swallowed successfully. No specific dysmotility was seen, including diffuse esophageal spasm as a cause of left chest pain when eating. No noted reflux or hiatal hernia. History of gastric adenocarcinoma. The stomach was not over distended. IMPRESSION: Negative exam.  No stricture or dysmotility. Electronically Signed   By: Monte Fantasia M.D.   On: 09/11/2017 16:21        Scheduled Meds: . atorvastatin  10 mg Oral Daily  . clopidogrel  75 mg Oral Daily  . dronabinol  2.5 mg Oral BID AC  . enoxaparin (LOVENOX) injection  30 mg Subcutaneous Q24H  . ipratropium-albuterol  3 mL Nebulization TID  . methylPREDNISolone (SOLU-MEDROL) injection  40 mg Intravenous Q12H  . mometasone-formoterol  2 puff Inhalation BID  . nystatin  5 mL Oral QID   Continuous Infusions: . azithromycin Stopped (09/11/17 0231)  . cefTRIAXone (ROCEPHIN)  IV Stopped (09/11/17 0120)     LOS: 1 day    Time spent: 35 minutes    Edwin Dada, MD [facility] 09/11/2017, 4:27 PM     Pager [phone] --- please page though AMION:  [email or web address] If 7PM-7AM, please contact night-coverage

## 2017-09-11 NOTE — Evaluation (Signed)
Occupational Therapy Evaluation Patient Details Name: Bobby Caldwell MRN: [id number] DOB: 05-15-1956 Today's Date: 09/11/2017    History of Present Illness Pt is a 62 y.o. Bobby Caldwell-speaking male admitted 09/10/17 with worsening SOB, cough, fever; worked up for CAP. CXR indicative of an infectious bronchiolitis/bronchopneumonia, as well as emphysema. PMH includes gastric adenocarcinoma on chemo, PAD, COPD, tobacco abuse.    Clinical Impression   This 62 y/o M presents with the above. At baseline pt reports was independent with functional mobility until a short time prior to this admission where pt was experiencing increased weakness and only able to ambulate a very short distance. Pt reports spouse assists with bathing ADLs. Pt completing stand pivot transfers without AD and with MinGuard assist this session; currently requiring MinA for LB ADLs. Pt limited mostly by decreased activity tolerance and generalized weakness. Pt will benefit from continued acute OT services and recommend additional [place] services after discharge to maximize his overall safety and independence with ADLs and mobility after return home.      Follow Up Recommendations  Home health OT;Supervision/Assistance - 24 hour    Equipment Recommendations  3 in 1 bedside commode           Precautions / Restrictions Precautions Precautions: Fall Restrictions Weight Bearing Restrictions: No      Mobility Bed Mobility Overal bed mobility: Independent                Transfers Overall transfer level: Needs assistance Equipment used: None Transfers: Sit to/from Stand;Stand Pivot Transfers Sit to Stand: Supervision Stand pivot transfers: Min guard       General transfer comment: pt completing stand pivot to recliner without AD or physical assist; minguard for safety     Balance Overall balance assessment: Needs assistance   Sitting balance-Leahy Scale: Good Sitting balance - Comments: Indep to don socks sitting  EOB     Standing balance-Leahy Scale: Fair                             ADL either performed or assessed with clinical judgement   ADL Overall ADL's : Needs assistance/impaired Eating/Feeding: Modified independent;Sitting   Grooming: Set up;Sitting   Upper Body Bathing: Min guard;Sitting   Lower Body Bathing: Sit to/from stand;Min guard   Upper Body Dressing : Min guard;Sitting   Lower Body Dressing: Minimal assistance;Sit to/from stand Lower Body Dressing Details (indicate cue type and reason): pt able to don socks sitting EOB without difficulty while with PT  Toilet Transfer: Min Statistician Details (indicate cue type and reason): simulated in transfer to [place] and Hygiene: Min guard;Sit to/from stand       Functional mobility during ADLs: Min guard General ADL Comments: pt limited this session mostly due to decreased activity tolerance/fatigue; completed stand pivot to recliner                         Pertinent Vitals/Pain Pain Assessment: No/denies pain          Extremity/Trunk Assessment Upper Extremity Assessment Upper Extremity Assessment: Generalized weakness   Lower Extremity Assessment Lower Extremity Assessment: Generalized weakness       Communication Communication Communication: Prefers language other than English;Interpreter utilized(Bobby Caldwell; phone interpreter used this session )   Cognition Arousal/Alertness: Awake/alert Behavior During Therapy: WFL for tasks assessed/performed Overall Cognitive Status: Within Functional Limits for tasks assessed  General Comments  Phone interpreter utilized               [name] expects to be discharged to:: Private residence Living Arrangements: Spouse/significant other Available Help at Discharge: Family;Available 24 hours/day Type of Home:  House Home Access: Stairs to enter [employer] of Steps: 3 Entrance Stairs-Rails: Right Home Layout: One level     Bathroom Shower/Tub: Tub/shower unit         Home Equipment: None          Prior Functioning/Environment Level of Independence: Needs assistance  Gait / Transfers Assistance Needed: Pt reports has been indep with all mobility until recently (unclear if this was with start of chemo). Now limited to amb very short distances due to fatigue/weakness ADL's / Homemaking Assistance Needed: Wife assists for bathing            OT Problem List: Decreased strength;Decreased activity tolerance;Cardiopulmonary status limiting activity      OT Treatment/Interventions: Self-care/ADL training;DME and/or AE instruction;Therapeutic activities;Balance training;Therapeutic exercise;Energy conservation;Patient/family education    OT Goals(Current goals can be found in the care plan section) Acute Rehab OT Goals Patient Stated Goal: Return home OT Goal Formulation: With patient Time For Goal Achievement: 09/25/17 Potential to Achieve Goals: Good  OT Frequency: Min 2X/week                             AM-PAC PT "6 Clicks" Daily Activity     Outcome Measure Help from another person eating meals?: None Help from another person taking care of personal grooming?: A Little Help from another person toileting, which includes using toliet, bedpan, or urinal?: A Little Help from another person bathing (including washing, rinsing, drying)?: A Little Help from another person to put on and taking off regular upper body clothing?: None Help from another person to put on and taking off regular lower body clothing?: A Little 6 Click Score: 20   End of Session Equipment Utilized During Treatment: Oxygen Nurse Communication: Mobility status  Activity Tolerance: Patient tolerated treatment well;Patient limited by fatigue Patient left: in chair;with call bell/phone  within reach;with chair alarm set  OT Visit Diagnosis: Muscle weakness (generalized) (M62.81)                Time: 0947-0962 OT Time Calculation (min): 12 min Charges:  OT General Charges $OT Visit: 1 Visit OT Evaluation $OT Eval Moderate Complexity: 1 Mod G-Codes:     Lou Cal, OT Pager [phone] 09/11/2017   Raymondo Band 09/11/2017, 2:26 PM

## 2017-09-12 ENCOUNTER — Ambulatory Visit: Payer: BLUE CROSS/BLUE SHIELD | Admitting: Vascular Surgery

## 2017-09-12 ENCOUNTER — Inpatient Hospital Stay (HOSPITAL_COMMUNITY): Admission: RE | Admit: 2017-09-12 | Payer: BLUE CROSS/BLUE SHIELD | Source: Ambulatory Visit

## 2017-09-12 ENCOUNTER — Encounter (HOSPITAL_COMMUNITY): Payer: Self-pay | Admitting: Surgery

## 2017-09-12 ENCOUNTER — Telehealth: Payer: Self-pay | Admitting: Vascular Surgery

## 2017-09-12 ENCOUNTER — Encounter (HOSPITAL_COMMUNITY): Payer: BLUE CROSS/BLUE SHIELD

## 2017-09-12 DIAGNOSIS — R0602 Shortness of breath: Secondary | ICD-10-CM

## 2017-09-12 DIAGNOSIS — J439 Emphysema, unspecified: Secondary | ICD-10-CM

## 2017-09-12 LAB — COMPREHENSIVE METABOLIC PANEL
ALT: 17 U/L (ref 17–63)
ANION GAP: 8 (ref 5–15)
AST: 16 U/L (ref 15–41)
Albumin: 2.9 g/dL — ABNORMAL LOW (ref 3.5–5.0)
Alkaline Phosphatase: 67 U/L (ref 38–126)
BILIRUBIN TOTAL: 0.6 mg/dL (ref 0.3–1.2)
BUN: 11 mg/dL (ref 6–20)
CHLORIDE: 97 mmol/L — AB (ref 101–111)
CO2: 32 mmol/L (ref 22–32)
Calcium: 8.7 mg/dL — ABNORMAL LOW (ref 8.9–10.3)
Creatinine, Ser: 0.51 mg/dL — ABNORMAL LOW (ref 0.61–1.24)
Glucose, Bld: 138 mg/dL — ABNORMAL HIGH (ref 65–99)
POTASSIUM: 5.1 mmol/L (ref 3.5–5.1)
Sodium: 137 mmol/L (ref 135–145)
Total Protein: 5.6 g/dL — ABNORMAL LOW (ref 6.5–8.1)

## 2017-09-12 LAB — CBC WITH DIFFERENTIAL/PLATELET
BASOS ABS: 0 10*3/uL (ref 0.0–0.1)
Basophils Relative: 0 %
EOS PCT: 0 %
Eosinophils Absolute: 0 10*3/uL (ref 0.0–0.7)
HEMATOCRIT: 39.6 % (ref 39.0–52.0)
Hemoglobin: 12.4 g/dL — ABNORMAL LOW (ref 13.0–17.0)
LYMPHS PCT: 5 %
Lymphs Abs: 0.4 10*3/uL — ABNORMAL LOW (ref 0.7–4.0)
MCH: 27.9 pg (ref 26.0–34.0)
MCHC: 31.3 g/dL (ref 30.0–36.0)
MCV: 89.2 fL (ref 78.0–100.0)
MONO ABS: 0.1 10*3/uL (ref 0.1–1.0)
MONOS PCT: 1 %
Neutro Abs: 7.7 10*3/uL (ref 1.7–7.7)
Neutrophils Relative %: 94 %
PLATELETS: 213 10*3/uL (ref 150–400)
RBC: 4.44 MIL/uL (ref 4.22–5.81)
RDW: 15.8 % — AB (ref 11.5–15.5)
WBC: 8.2 10*3/uL (ref 4.0–10.5)

## 2017-09-12 LAB — URINE CULTURE: Culture: NO GROWTH

## 2017-09-12 LAB — LEGIONELLA PNEUMOPHILA SEROGP 1 UR AG: L. PNEUMOPHILA SEROGP 1 UR AG: NEGATIVE

## 2017-09-12 MED ORDER — AZITHROMYCIN 500 MG PO TABS: 500.0000 mg | ORAL_TABLET | Freq: Every day | ORAL | 0 refills | Status: AC

## 2017-09-12 MED ORDER — AZITHROMYCIN 500 MG PO TABS: 500.0000 mg | ORAL_TABLET | ORAL | Status: DC

## 2017-09-12 MED ORDER — METHYLPREDNISOLONE SODIUM SUCC 40 MG IJ SOLR
40.0000 mg | Freq: Every day | INTRAMUSCULAR | Status: DC
  Administered 2017-09-12: 40 mg via INTRAVENOUS
  Filled 2017-09-12: qty 1

## 2017-09-12 MED ORDER — METHYLPREDNISOLONE 4 MG PO TBPK: ORAL_TABLET | ORAL | 0 refills | Status: DC

## 2017-09-12 MED ORDER — CEFUROXIME AXETIL 250 MG PO TABS: 250.0000 mg | ORAL_TABLET | Freq: Two times a day (BID) | ORAL | 0 refills | Status: AC

## 2017-09-12 MED ORDER — NYSTATIN 100000 UNIT/ML MT SUSP: 5.0000 mL | Freq: Four times a day (QID) | OROMUCOSAL | 0 refills | Status: AC

## 2017-09-12 NOTE — Discharge Instructions (Signed)
Chronic Obstructive Pulmonary Disease Chronic obstructive pulmonary disease (COPD) is a long-term (chronic) lung problem. When you have COPD, it is hard for air to get in and out of your lungs. The way your lungs work will never return to normal. Usually the condition gets worse over time. There are things you can do to keep yourself as healthy as possible. Your doctor may treat your condition with:  Medicines.  Quitting smoking, if you smoke.  Rehabilitation. This may involve a team of specialists.  Oxygen.  Exercise and changes to your diet.  Lung surgery.  Comfort measures (palliative care).  Follow these instructions at home: Medicines  Take over-the-counter and prescription medicines only as told by your doctor.  Talk to your doctor before taking any cough or allergy medicines. You may need to avoid medicines that cause your lungs to be dry. Lifestyle  If you smoke, stop. Smoking makes the problem worse. If you need help quitting, ask your doctor.  Avoid being around things that make your breathing worse. This may include smoke, chemicals, and fumes.  Stay active, but remember to also rest.  Learn and use tips on how to relax.  Make sure you get enough sleep. Most adults need at least 7 hours a night.  Eat healthy foods. Eat smaller meals more often. Rest before meals. Controlled breathing  Learn and use tips on how to control your breathing as told by your doctor. Try: ? Breathing in (inhaling) through your nose for 1 second. Then, pucker your lips and breath out (exhale) through your lips for 2 seconds. ? Putting one hand on your belly (abdomen). Breathe in slowly through your nose for 1 second. Your hand on your belly should move out. Pucker your lips and breathe out slowly through your lips. Your hand on your belly should move in as you breathe out. Controlled coughing  Learn and use controlled coughing to clear mucus from your lungs. The steps are: 1. Lean your  head a little forward. 2. Breathe in deeply. 3. Try to hold your breath for 3 seconds. 4. Keep your mouth slightly open while coughing 2 times. 5. Spit any mucus out into a tissue. 6. Rest and do the steps again 1 or 2 times as needed. General instructions  Make sure you get all the shots (vaccines) that your doctor recommends. Ask your doctor about a flu shot and a pneumonia shot.  Use oxygen therapy and therapy to help improve your lungs (pulmonary rehabilitation) if told by your doctor. If you need home oxygen therapy, ask your doctor if you should buy a tool to measure your oxygen level (oximeter).  Make a COPD action plan with your doctor. This helps you know what to do if you feel worse than usual.  Manage any other conditions you have as told by your doctor.  Avoid going outside when it is very hot, cold, or humid.  Avoid people who have a sickness you can catch (contagious).  Keep all follow-up visits as told by your doctor. This is important. Contact a doctor if:  You cough up more mucus than usual.  There is a change in the color or thickness of the mucus.  It is harder to breathe than usual.  Your breathing is faster than usual.  You have trouble sleeping.  You need to use your medicines more often than usual.  You have trouble doing your normal activities such as getting dressed or walking around the house. Get help right away if:    You have shortness of breath while resting.  You have shortness of breath that stops you from: ? Being able to talk. ? Doing normal activities.  Your chest hurts for longer than 5 minutes.  Your skin color is more blue than usual.  Your pulse oximeter shows that you have low oxygen for longer than 5 minutes.  You have a fever.  You feel too tired to breathe normally. Summary  Chronic obstructive pulmonary disease (COPD) is a long-term lung problem.  The way your lungs work will never return to normal. Usually the  condition gets worse over time. There are things you can do to keep yourself as healthy as possible.  Take over-the-counter and prescription medicines only as told by your doctor.  If you smoke, stop. Smoking makes the problem worse. This information is not intended to replace advice given to you by your health care provider. Make sure you discuss any questions you have with your health care provider. Document Released: 12/13/2007 Document Revised: 12/02/2015 Document Reviewed: 02/20/2013 Elsevier Interactive Patient Education  2017 [place] Pneumonia, Adult Pneumonia is an infection of the lungs. One type of pneumonia can happen while a person is in a hospital. A different type can happen when a person is not in a hospital (community-acquired pneumonia). It is easy for this kind to spread from person to person. It can spread to you if you breathe near an infected person who coughs or sneezes. Some symptoms include:  A dry cough.  A wet (productive) cough.  Fever.  Sweating.  Chest pain.  Follow these instructions at home:  Take over-the-counter and prescription medicines only as told by your doctor. ? Only take cough medicine if you are losing sleep. ? If you were prescribed an antibiotic medicine, take it as told by your doctor. Do not stop taking the antibiotic even if you start to feel better.  Sleep with your head and neck raised (elevated). You can do this by putting a few pillows under your head, or you can sleep in a recliner.  Do not use tobacco products. These include cigarettes, chewing tobacco, and e-cigarettes. If you need help quitting, ask your doctor.  Drink enough water to keep your pee (urine) clear or pale yellow. A shot (vaccine) can help prevent pneumonia. Shots are often suggested for:  People older than 61 years of age.  People older than 61 years of age: ? Who are having cancer treatment. ? Who have long-term (chronic) lung  disease. ? Who have problems with their body's defense system (immune system).  You may also prevent pneumonia if you take these actions:  Get the flu (influenza) shot every year.  Go to the dentist as often as told.  Wash your hands often. If soap and water are not available, use hand sanitizer.  Contact a doctor if:  You have a fever.  You lose sleep because your cough medicine does not help. Get help right away if:  You are short of breath and it gets worse.  You have more chest pain.  Your sickness gets worse. This is very serious if: ? You are an older adult. ? Your body's defense system is weak.  You cough up blood. This information is not intended to replace advice given to you by your health care provider. Make sure you discuss any questions you have with your health care provider. Document Released: 12/13/2007 Document Revised: 12/02/2015 Document Reviewed: 10/21/2014 Elsevier Interactive Patient Education  [employer].

## 2017-09-12 NOTE — Progress Notes (Signed)
Patient discharged to home with home health with [facility], interpreter used for translation during discharge process, patient's questions and concerns answered, pt connected to portable oxygen tank for home, patient also informed of message from vascular surgeon that his appointment has been changed to May 8th 2019. Neta Mends RN 4:34 PM 09-12-2017

## 2017-09-12 NOTE — Progress Notes (Signed)
  Speech Language Pathology Treatment: Dysphagia  Patient Details Name: Bobby Caldwell MRN: [id number] DOB: 1956-03-08 Today's Date: 09/12/2017 Time: 9672-8979 SLP Time Calculation (min) (ACUTE ONLY): 13 min  Assessment / Plan / Recommendation Clinical Impression  Pt seen for dysphagia prior to likely discharge today. Attempted use of audio/video interpreter system although he was unable to hear this therapist. Pt appeared able to comprehend SLP's education and recommendations. Reviewed that esophagram was normal. Swallowed regular texture and liquids with significantly decreased effort today; no grimace, no globus sensation and pt affirms improvements today. Educated pt to continue consuming small amounts at one time, remain upright minimum 45 minutes after meals. Recommend mechanical soft textures at home (no raw vegetables, hard or crumbly foods, tender/moist meats) and thin liquids as tolerated. If esophageal symptoms arise, return to more liquid consistency.    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 Per chart pt states he has no appetite and that his food feels like it gets stuck in his chest causing chest discomfort but is able to get it down eventually. CXR chronic bronchitic-smoking related changes, stable. No acute pneumonia nor CHF. MD admitted for pna.       SLP Plan  All goals met;Discharge SLP treatment due to (comment)       Recommendations  Diet recommendations: Thin liquid;Dysphagia 3 (mechanical soft) Liquids provided via: Cup;Straw Medication Administration: Whole meds with puree Supervision: Patient able to self feed Compensations: Slow rate;Small sips/bites;Follow solids with liquid;Clear throat intermittently;Multiple  dry swallows after each bite/sip Postural Changes and/or Swallow Maneuvers: Seated upright 90 degrees;Upright 30-60 min after meal                Oral Care Recommendations: Oral care BID SLP Visit Diagnosis: Dysphagia, pharyngoesophageal phase (R13.14) Plan: All goals met;Discharge SLP treatment due to (comment)                      Houston Siren 09/12/2017, 11:52 AM  Orbie Pyo Colvin Caroli.[employer] [phone]

## 2017-09-12 NOTE — Telephone Encounter (Signed)
I rescheduled this patient from today until 11/14/17. Patient is in hospital now. Per Dr.Chen it is fine for patient to wait until 11/14/17 for follow up appt w/ him. Relayed this information to Dr.Danford at [facility] Ms nurse on [place] stated she would have the interpreter relay this information and appt time/date to patient. With also the intent that if the patient needs to be seen sooner to please call us. I also mailed the information to the patient as well. awt

## 2017-09-12 NOTE — Plan of Care (Signed)
  Progressing Health Behavior/Discharge Planning: Ability to manage health-related needs will improve 09/12/2017 0151 - Progressing by Tristan Schroeder, RN Clinical Measurements: Ability to maintain clinical measurements within normal limits will improve 09/12/2017 0151 - Progressing by Tristan Schroeder, RN Will remain free from infection 09/12/2017 0151 - Progressing by Tristan Schroeder, RN Diagnostic test results will improve 09/12/2017 0151 - Progressing by Tristan Schroeder, RN Activity: Risk for activity intolerance will decrease 09/12/2017 0151 - Progressing by Tristan Schroeder, RN

## 2017-09-12 NOTE — Care Management Note (Signed)
Case Management Note  Patient Details  Name: Bobby Caldwell MRN: [id number] Date of Birth: 07-29-1955  Subjective/Objective:   Pneumonia               Action/Plan: Patient lives at home with spouse; PCP: Minette Brine; has private insurance with [employer] with prescription drug coverage; [place] choices offered, pt chose [facility]; [place] with [facility] called for arrangements; pt states that he has oxygen at home; CM will continue to follow for progression of care.  Expected Discharge Date:  09/13/17               Expected Discharge Plan:  [name]  Discharge planning Services  CM Consult  Choice offered to:  Patient  HH Arranged:  RN, Disease Management, PT, OT [place] Agency:  [facility]  Status of Service:  In process, will continue to follow  Sherrilyn Rist [phone] 09/12/2017, 12:59 PM

## 2017-09-12 NOTE — Telephone Encounter (Signed)
-----   Message from Conrad [place], MD sent at 09/12/2017 10:24 AM EST ----- Regarding: RE: Appointment Question Ok to wait. Pt has severe COPD and gastric cancer, so those issues will take priority.  ----- Message ----- From: Bobby Caldwell Sent: 09/12/2017   9:31 AM To: [name], MD Subject: Appointment Question                           Dr.Chen, This patient was scheduled to see you today but is hospitalized due to complications from his COPD. I rescheduled his appointment to 11/14/17 which is the first opening with his two vascular studies and to see you on the same date. My question for you is this okay to wait an additional two months to be seen? Or should I move some other patients around and get the patient in sooner to be seen. Please advise. Thanks, Anne Ng

## 2017-09-12 NOTE — Discharge Summary (Signed)
Physician Discharge Summary  Bobby Caldwell HYQ:[id number] DOB: 30-Aug-1955 DOA: 09/10/2017  PCP: Bobby Caldwell  Admit date: 09/10/2017 Discharge date: 09/12/2017  Time spent: 45 minutes  Recommendations for Outpatient Follow-up:  Patient will be discharged to home with home health physical and occupational therapy, nursing..  Patient will need to follow up with primary care provider within one week of discharge.  Patient should continue medications as prescribed.  Patient should follow a dysphagia 3 diet.   Discharge Diagnoses:  Acute on chronic respiratory failure with hypoxia/community-acquired pneumonia/COPD exacerbation Dysphagia Gastric adenocarcinoma/Severe protein calorie malnutrition Hypokalemia  Peripheral vascular disease  Chest pain Physical deconditioning  Discharge Condition: Stable  Diet recommendation: dysphagia 3  Filed Weights   09/10/17 2331 09/11/17 0433 09/12/17 0729  Weight: 52.5 kg (115 lb 11.9 oz) 53.1 kg (117 lb 1 oz) 51 kg (112 lb 6.4 oz)    History of present illness:  On 09/10/2017 by Dr. Raiford Noble Larrell Caldwell is a 62 y.o. 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 History was obtained with the assistance of [place] [phone]. Patient states he started feeling ill 3 days ago with worsening symptoms of having subjective fevers, dizziness, worsening SOB, vomiting with dry heaves, and Cough with "orange" sputum. Had Chemotherapy a 3days ago and has been coughing up sputum that was orange in color. Also states he has no appetite and that his food feels like it gets stuck in his chest causing chest discomfort but is able to get it down eventually. Also complains of pedal edema and being weaker.  Presented to the ED for these symptoms and [facility] was asked to admit for PNA and Acute on Chronic Respiratory Failure.  Hospital Course:  Acute on chronic respiratory failure with hypoxia/community-acquired pneumonia/COPD exacerbation -Respiratory viral panel and influenza PCR negative -Patient was placed on azithromycin and ceftriaxone -blood cultures negative -Continue steroids, neb treatments, supplemental oxygen  -Of note, patient uses 2 L of home oxygen at baseline  Dysphagia -barium swallow unremarkable -speech therapy consulted, recommended dysphagia 3 diet  Gastric adenocarcinoma/Severe protein calorie malnutrition -Continue dronabinol  Hypokalemia  -resolved  Peripheral vascular disease  -Continue Plavix, statin -Patient follows with vascular surgery  Chest pain -Currently no active chest pain -Troponins cycled and unremarkable  Physical deconditioning -PT/OT consulted, recommended home health, 3 in 1 bedside commode, wheelchair, wheelchair cushion, 3 and 1, rolling walker with 5" wheels  Procedures: Barium esophagram   Consultations: None  Discharge Exam: Vitals:   09/12/17 1210 09/12/17 1339  BP: 116/66   Pulse: 89   Resp: 20   Temp: 98.2 F (36.8 C)   SpO2: 95% 98%   Patient seen and examined on day of discharge.  Interpreter was used.  States his breathing has improved.  Feels his breathing is better while he is laying down.  Denies current chest pain, abdominal pain, nausea or vomiting, diarrhea or constipation.   General: Well developed, thin, NAD  HEENT: NCAT, mucous membranes moist.  Neck: Supple  Cardiovascular: S1 S2 auscultated, no rubs, murmurs or gallops. Regular rate and rhythm.  Respiratory: Clear to auscultation bilaterally with equal chest rise  Abdomen: Soft, nontender, nondistended, + bowel sounds  Extremities: warm dry without cyanosis clubbing or edema  Neuro: AAOx3, Nonfocal  Psych: Normal  affect and demeanor with intact  judgement and insight  Discharge Instructions Discharge Instructions    Discharge instructions   Complete by:  As directed    Patient will be discharged to home with home health physical and occupational therapy, nursing..  Patient will need to follow up with primary care provider within one week of discharge.  Patient should continue medications as prescribed.  Patient should follow a dysphagia 3 diet.     Allergies as of 09/12/2017   No Known Allergies     Medication List    STOP taking these medications   LORazepam 0.5 MG tablet Commonly known as:  ATIVAN   mirtazapine 7.5 MG tablet Commonly known as:  REMERON   traMADol 50 MG tablet Commonly known as:  ULTRAM     TAKE these medications   Albuterol Sulfate 108 (90 Base) MCG/ACT Aepb Commonly known as:  PROAIR RESPICLICK Inhale 2 puffs into the lungs every 6 (six) hours as needed.   atorvastatin 10 MG tablet Commonly known as:  LIPITOR Take 1 tablet (10 mg total) by mouth daily.   azithromycin 500 MG tablet Commonly known as:  ZITHROMAX Take 1 tablet (500 mg total) by mouth daily for 3 days. Take 1 tablet daily for 3 days.   budesonide-formoterol 160-4.5 MCG/ACT inhaler Commonly known as:  SYMBICORT Inhale 2 puffs into the lungs 2 (two) times daily.   cefUROXime 250 MG tablet Commonly known as:  CEFTIN Take 1 tablet (250 mg total) by mouth 2 (two) times daily for 4 days.   clopidogrel 75 MG tablet Commonly known as:  PLAVIX Take 1 tablet (75 mg total) by mouth daily.   dexamethasone 4 MG tablet Commonly known as:  DECADRON Take 2 tablets (8 mg total) by mouth daily. Start the day after chemotherapy for 2 days. Take with food.   dronabinol 2.5 MG capsule Commonly known as:  MARINOL Take 1 capsule (2.5 mg total) by mouth 2 (two) times daily before a meal.   lidocaine-prilocaine cream Commonly known as:  EMLA Apply to affected area once What changed:    how much to take  how to take this  when to take  this  reasons to take this  additional instructions   methylPREDNISolone 4 MG Tbpk tablet Commonly known as:  MEDROL DOSEPAK Take as directed   nystatin 100000 UNIT/ML suspension Commonly known as:  MYCOSTATIN Take 5 mLs (500,000 Units total) by mouth 4 (four) times daily for 7 days.   ondansetron 8 MG tablet Commonly known as:  ZOFRAN Take 1 tablet (8 mg total) by mouth 2 (two) times daily as needed for refractory nausea / vomiting. Start on day 3 after chemotherapy.   oxyCODONE-acetaminophen 5-325 MG tablet Commonly known as:  ROXICET Take 1 tablet by mouth every 6 (six) hours as needed for severe pain.   prochlorperazine 10 MG tablet Commonly known as:  COMPAZINE Take 1 tablet (10 mg total) by mouth every 6 (six) hours as needed (Nausea or vomiting).   Tiotropium Bromide Monohydrate 2.5 MCG/ACT Aers Commonly known as:  SPIRIVA RESPIMAT Inhale 2 puffs into the lungs daily.            Durable Medical Equipment  (From admission, onward)        Start     Ordered   09/12/17 1454  DME 3-in-1  Once     09/12/17 1454   09/12/17 1452  For home use only DME lightweight manual wheelchair with seat cushion  (Wheelchairs)  Once    Comments:  Patient suffers from cancer and is deconditioning.  A wheelchair will allow patient to safely perform daily activities. Patient is not able to propel themselves in the home using a standard weight wheelchair due to physical weakness. Patient can self propel in the lightweight wheelchair.  Accessories: elevating leg rests (ELRs), wheel locks, extensions and anti-tippers.   09/12/17 1454   09/12/17 1451  For home use only DME Walker rolling  [facility])  Once    Question:  Patient needs a walker to treat with the following condition  Answer:  Physical deconditioning   09/12/17 1454     No Known Allergies Follow-up Information    Bobby Caldwell. Schedule an appointment as soon as possible for a visit in 1 week(s).   Specialty:  General  Practice Why:  Hospital follow up Contact information: [address] [place] [address] [place], [facility] up.   Specialty:  [place] Why:  They will do your home health care at your home Contact information: [place] [address] [phone]            The results of significant diagnostics from this hospitalization (including imaging, microbiology, ancillary and laboratory) are listed below for reference.    Significant Diagnostic Studies: Dg Chest 1 View  Result Date: 09/10/2017 CLINICAL DATA:  Sensation of food being stuck in the chest. EXAM: CHEST 1 VIEW COMPARISON:  Chest x-ray of May 08, 2017 FINDINGS: The lungs remain hyperinflated. There is no focal infiltrate. There is no pleural effusion. The heart and pulmonary vascularity are normal. The mediastinum is normal in width. The trachea is midline. The porta catheter tip projects over the distal third of the SVC. The bony thorax exhibits no acute abnormality. IMPRESSION: Chronic bronchitic-smoking related changes, stable. No acute pneumonia nor CHF. No mediastinal abnormality is observed. Electronically Signed   By: David  Martinique M.D.   On: 09/10/2017 12:33   Dg Chest 2 View  Result Date: 09/11/2017 CLINICAL DATA:  Pneumonia, COPD, smoker EXAM: CHEST - 2 VIEW COMPARISON:  09/10/2017 FINDINGS: RIGHT jugular Port-A-Cath with tip projecting over SVC. Normal heart size, mediastinal contours, and pulmonary vascularity. Emphysematous and bronchitic changes consistent with COPD. Bibasilar atelectasis. Lungs clear. No definite acute infiltrate, pleural effusion, or pneumothorax. Bones demineralized. IMPRESSION: COPD changes with bibasilar atelectasis. No acute infiltrate. Electronically Signed   By: Lavonia Dana M.D.   On: 09/11/2017 15:42   Ct Angio Chest Pe W/cm &/or Wo Cm  Result Date: 09/10/2017 CLINICAL DATA:  Gastric cancer, shortness of breath,  fever. EXAM: CT ANGIOGRAPHY CHEST WITH CONTRAST TECHNIQUE: Multidetector CT imaging of the chest was performed using the standard protocol during bolus administration of intravenous contrast. Multiplanar CT image reconstructions and MIPs were obtained to evaluate the vascular anatomy. CONTRAST:  113mL ISOVUE-370 IOPAMIDOL (ISOVUE-370) INJECTION 76% COMPARISON:  10/21/2016. FINDINGS: Cardiovascular: Negative for pulmonary embolus. Right IJ Port-A-Cath terminates at the SVC RA junction. Heart size normal. No pericardial effusion. Mediastinum/Nodes: No pathologically enlarged mediastinal, hilar or axillary lymph nodes. Esophagus is grossly unremarkable. Lungs/Pleura: Moderate to severe centrilobular emphysema. There may be a small amount of fluid tracking along the minor fissure. Image quality is degraded by respiratory motion. Peribronchovascular consolidation and nodularity in the lower lobes are new. Scattered subpleural lymph nodes along the fissures. 5 mm apical left upper lobe nodule, stable. Calcified granulomas. No pleural fluid. Debris seen in the  left mainstem bronchus and left lower lobe bronchi. Upper Abdomen: Visualized portions of the liver, adrenal glands, kidneys, spleen, pancreas, stomach and bowel are grossly unremarkable. No upper abdominal adenopathy. Musculoskeletal: No worrisome lytic or sclerotic lesions. Review of the MIP images confirms the above findings. IMPRESSION: 1. Negative for pulmonary embolus. 2. New peribronchovascular consolidation and nodularity in both lower lobes, most indicative of an infectious bronchiolitis/bronchopneumonia. 3.  Emphysema (ICD10-J43.9). Electronically Signed   By: Lorin Picket M.D.   On: 09/10/2017 15:15   Ct Abdomen Pelvis W Contrast  Result Date: 09/10/2017 CLINICAL DATA:  Shortness of breath.  History gastric cancer. EXAM: CT ABDOMEN AND PELVIS WITH CONTRAST TECHNIQUE: Multidetector CT imaging of the abdomen and pelvis was performed using the standard  protocol following bolus administration of intravenous contrast. CONTRAST:  159mL ISOVUE-370 IOPAMIDOL (ISOVUE-370) INJECTION 76% COMPARISON:  06/22/2017. FINDINGS: Lower chest: No pleural effusion. Multiple new nodular densities are identified within both lung bases. Index nodule within the right base measures 1.4 cm, image 28/series 4. New from previous exam. Density within the posterior right lower lobe measures 1.1 cm, image 23 of series 4. Also new from previous exam. Anterior right lower lobe lung nodular density measures 0.9 cm, image 21 of series 4. New from previous exam. Hepatobiliary: No focal liver abnormality. The gallbladder appears normal. No biliary dilatation. Pancreas: Normal appearance of the pancreas. Spleen: The spleen is normal. Adrenals/Urinary Tract: The adrenal glands are unremarkable. Normal appearance of both kidneys. The urinary bladder is normal. Stomach/Bowel: Previous gastric mass is inconspicuous on today's study. The small bowel loops have a normal course and caliber. The appendix is visualized and appears normal. No pathologic dilatation of the colon. Vascular/Lymphatic: Aortic atherosclerosis. No aneurysm. No upper abdominal adenopathy identified. 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 A stent is identified in the left common iliac artery. A femoral bypass graft is identified. Reproductive: Prostate is unremarkable. Other: No abdominal wall hernia or abnormality. No abdominopelvic ascites. Musculoskeletal: No aggressive lytic or sclerotic bone lesions. IMPRESSION: 1. No acute findings identified within the abdomen or pelvis. 2. Interval development of multiple bilateral pulmonary densities within both lower lobes. These are indeterminate, likely post infectious or inflammatory in etiology. Atypical infection not excluded. Metastatic disease is considered less favored but not entirely excluded and follow-up imaging to ensure resolution is advised. 3.  No evidence to suggest recurrent tumor or metastatic disease within the abdomen or pelvis. 4.  Aortic Atherosclerosis (ICD10-I70.0). Electronically Signed   By: Kerby Moors M.D.   On: 09/10/2017 15:11   Dg Esophagus  Result Date: 09/11/2017 CLINICAL DATA:  Sensation of food sticking in the throat. Patient reports focal left upper chest wall pain when eating. History of gastric adenocarcinoma. EXAM: ESOPHOGRAM/BARIUM SWALLOW TECHNIQUE: Single contrast examination was performed using  thin barium. FLUOROSCOPY TIME:  Fluoroscopy Time:  1.3 minutes Radiation Exposure Index (if provided by the fluoroscopic device): 2.2 mGy Number of Acquired Spot Images: 0 COMPARISON:  None. FINDINGS: Lateral pharyngeal imaging shows normal swallowing. There was no aspiration or obstructive process. Esophagus has normal distensibility and smooth mucosal contour. A 13 mm barium tablet was swallowed successfully. No specific dysmotility was seen, including diffuse esophageal spasm as a cause of left chest pain when eating. No noted reflux or hiatal hernia. History of gastric adenocarcinoma. The stomach was not over distended. IMPRESSION: Negative exam.  No stricture or dysmotility. Electronically Signed   By: Monte Fantasia M.D.   On: 09/11/2017 16:21  Microbiology: Recent Results (from the past 240 hour(s))  Blood Culture (routine x 2)     Status: None (Preliminary result)   Collection Time: 09/10/17 12:31 PM  Result Value Ref Range Status   Specimen Description   Final    BLOOD LEFT ARM Performed at [facility], [place] [address], [place], [place] [address]    Special Requests   Final    IN PEDIATRIC BOTTLE Blood Culture adequate volume Performed at [facility] [address] [place], [place] [address]    Culture   Final    NO GROWTH 2 DAYS Performed at [facility] [address] [place], [place] [address]    Report Status PENDING  Incomplete  Blood Culture  (routine x 2)     Status: None (Preliminary result)   Collection Time: 09/10/17 12:58 PM  Result Value Ref Range Status   Specimen Description   Final    BLOOD LEFT HAND Performed at [facility] [address] [place], [place] [address]    Special Requests   Final    BOTTLES DRAWN AEROBIC AND ANAEROBIC Blood Culture adequate volume Performed at [facility] [address], [place], [place] [address]    Culture   Final    NO GROWTH 2 DAYS Performed at [facility] [address] [place], [place] [address]    Report Status PENDING  Incomplete  Respiratory Panel by PCR     Status: None   Collection Time: 09/10/17 12:58 PM  Result Value Ref Range Status   Adenovirus NOT DETECTED NOT DETECTED Final   Coronavirus 229E NOT DETECTED NOT DETECTED Final   Coronavirus HKU1 NOT DETECTED NOT DETECTED Final   Coronavirus NL63 NOT DETECTED NOT DETECTED Final   Coronavirus OC43 NOT DETECTED NOT DETECTED Final   Metapneumovirus NOT DETECTED NOT DETECTED Final   Rhinovirus / Enterovirus NOT DETECTED NOT DETECTED Final   Influenza A NOT DETECTED NOT DETECTED Final   Influenza B NOT DETECTED NOT DETECTED Final   Parainfluenza Virus 1 NOT DETECTED NOT DETECTED Final   Parainfluenza Virus 2 NOT DETECTED NOT DETECTED Final   Parainfluenza Virus 3 NOT DETECTED NOT DETECTED Final   Parainfluenza Virus 4 NOT DETECTED NOT DETECTED Final   Respiratory Syncytial Virus NOT DETECTED NOT DETECTED Final   Bordetella pertussis NOT DETECTED NOT DETECTED Final   Chlamydophila pneumoniae NOT DETECTED NOT DETECTED Final   Mycoplasma pneumoniae NOT DETECTED NOT DETECTED Final  Urine culture     Status: None   Collection Time: 09/10/17 12:58 PM  Result Value Ref Range Status   Specimen Description   Final    URINE, CLEAN CATCH Performed at [facility], [place] [address], [place], [place] [address]    Special Requests   Final    NONE Performed at  [facility], [place] [address], [place], [place] [address]    Culture   Final    NO GROWTH Performed at [facility], [place] [address] [place], [place] [address]    Report Status 09/12/2017 FINAL  Final  Respiratory Panel by PCR     Status: None   Collection Time: 09/10/17 11:31 PM  Result Value Ref Range Status   Adenovirus NOT DETECTED NOT DETECTED Final   Coronavirus 229E NOT DETECTED NOT DETECTED Final   Coronavirus HKU1 NOT DETECTED NOT DETECTED Final   Coronavirus NL63 NOT DETECTED NOT DETECTED Final   Coronavirus  OC43 NOT DETECTED NOT DETECTED Final   Metapneumovirus NOT DETECTED NOT DETECTED Final   Rhinovirus / Enterovirus NOT DETECTED NOT DETECTED Final   Influenza A NOT DETECTED NOT DETECTED Final   Influenza B NOT DETECTED NOT DETECTED Final   Parainfluenza Virus 1 NOT DETECTED NOT DETECTED Final   Parainfluenza Virus 2 NOT DETECTED NOT DETECTED Final   Parainfluenza Virus 3 NOT DETECTED NOT DETECTED Final   Parainfluenza Virus 4 NOT DETECTED NOT DETECTED Final   Respiratory Syncytial Virus NOT DETECTED NOT DETECTED Final   Bordetella pertussis NOT DETECTED NOT DETECTED Final   Chlamydophila pneumoniae NOT DETECTED NOT DETECTED Final   Mycoplasma pneumoniae NOT DETECTED NOT DETECTED Final    Comment: Performed at [facility], [place] [address], [place], [place] [address]  MRSA PCR Screening     Status: None   Collection Time: 09/11/17 10:44 AM  Result Value Ref Range Status   MRSA by PCR NEGATIVE NEGATIVE Final    Comment:        The GeneXpert MRSA Assay (FDA approved for NASAL specimens only), is one component of a comprehensive MRSA colonization surveillance program. It is not intended to diagnose MRSA infection nor to guide or monitor treatment for MRSA infections. Performed at [facility], [place] [address], [place], [place] [address]      Labs: Basic Metabolic Panel: Recent Labs  Lab 09/10/17 1150 09/10/17 2338  09/11/17 0429 09/12/17 0505  NA 142  --  137 137  K 3.4*  --  5.1 5.1  CL 99*  --  98* 97*  CO2 34*  --  32 32  GLUCOSE 145*  --  116* 138*  BUN 12  --  6 11  CREATININE 0.48* 0.52* 0.42* 0.51*  CALCIUM 8.0*  --  8.3* 8.7*   Liver Function Tests: Recent Labs  Lab 09/10/17 1150 09/11/17 0429 09/12/17 0505  AST 18 14* 16  ALT 20 20 17   ALKPHOS 73 74 67  BILITOT 0.6 0.7 0.6  PROT 5.6* 5.9* 5.6*  ALBUMIN 2.9* 2.8* 2.9*   No results for input(s): LIPASE, AMYLASE in the last 168 hours. No results for input(s): AMMONIA in the last 168 hours. CBC: Recent Labs  Lab 09/10/17 1150 09/10/17 2338 09/11/17 0429 09/12/17 0505  WBC 8.4 7.9 10.2 8.2  NEUTROABS 6.5  --  9.7* 7.7  HGB 12.9* 12.5* 12.8* 12.4*  HCT 40.4 39.8 41.2 39.6  MCV 89.4 90.0 89.4 89.2  PLT 227 200 214 213   Cardiac Enzymes: Recent Labs  Lab 09/11/17 1016 09/11/17 1631  TROPONINI <0.03 <0.03   BNP: BNP (last 3 results) Recent Labs    09/10/17 1150  BNP 355.3*    ProBNP (last 3 results) Recent Labs    03/26/17 1040  PROBNP 25.0    CBG: No results for input(s): GLUCAP in the last 168 hours.     Signed:  Cristal Ford  [facility] 09/12/2017, 2:55 PM

## 2017-09-15 LAB — CULTURE, BLOOD (ROUTINE X 2)
CULTURE: NO GROWTH
Culture: NO GROWTH
Special Requests: ADEQUATE
Special Requests: ADEQUATE

## 2017-09-15 NOTE — Progress Notes (Signed)
Bobby Caldwell  [phone] [phone]) [phone]  Clinic Follow Up Note   Patient Care Team: Minette Brine as PCP - General (General Practice) [name], MD as Consulting Physician (Pulmonary Disease) [name], MD as Consulting Physician (Hematology) [name], MD as Attending Physician (Gastroenterology) [name], MD as Consulting Physician (General Surgery)   Date of Service: 09/17/2017   CHIEF COMPLAINTS:  Follow up gastric Adenocarcinoma   Oncology History   Cancer Staging Gastric cancer [facility]) Staging form: Stomach, AJCC 8th Edition - Clinical stage from 05/24/2017: Stage I (cT2, cN0, cM0) - Signed by [name], MD on 06/19/2017       Gastric cancer ([place])   05/24/2017 Procedure    EGD by Dr. [name] 05/24/17 IMPRESSION Ulcerated mass like region in the distal stomach (4cm across), extensively biopsied. This appears neoplastic. - Abnormal mucosa throughout stomach otherwise (mild-moderate gastritis), also extensively biopsied.      05/24/2017 Initial Biopsy    Diagnosis 05/24/17  1. Stomach, biopsy, mass distal - ADENOCARCINOMA, SEE COMMENT. - CHRONIC ACTIVE GASTRITIS WITH INTESTINAL METAPLASIA. 2. 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 - NO DYSPLASIA OR MALIGNANCY      06/04/2017 Initial Diagnosis    Gastric cancer ([place])      06/07/2017 Procedure    EUS by Dr. [name] on 06/07/17  IMPRESSION:  5cm across, partially circumferential uT2N0 gastric adenocarcinoma laying in the distal stomach 3-4cm proximal to the pylorus.      06/22/2017 Imaging    CT CAP W Contrast 06/22/17  IMPRESSION: 1. The gastric mass is somewhat inconspicuous on CT, but a potential 1.2 cm thick mucosal masses seen along the posterior gastric border on image 83/2. There several small adjacent right gastric lymph nodes measuring up to 0.7 cm in short axis, but  no overtly pathologic adenopathy and no findings of hepatic or peritoneal metastatic disease at this time. 2. Calcified granulomas in both lungs along with several noncalcified nodules stable from August 2016 and likely benign given this long-term stability. 3. Other imaging findings of potential clinical significance: Aortic Atherosclerosis (ICD10-I70.0) and Emphysema (ICD10-J43.9). Airway thickening is present, suggesting bronchitis or reactive airways disease. 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 Disc bulges at L3-4 and L4-5.      07/05/2017 Surgery    PAC placement       07/07/2017 -  Chemotherapy    Neoadjuvant FOLFOX with with leucovorin every 2 weeks for 6 cycles starting 07/07/17. Will start with reduced dose for cycle 1.       09/10/2017 Imaging     CT AP W Contrast 09/10/17 IMPRESSION: 1. No acute findings identified within the abdomen or pelvis. 2. Interval development of multiple bilateral pulmonary densities within both lower lobes. These are indeterminate, likely post infectious or inflammatory in etiology. Atypical infection not excluded. Metastatic disease is considered less favored but not entirely excluded and follow-up imaging to ensure resolution is advised. 3. No evidence to suggest recurrent tumor or metastatic disease within the abdomen or pelvis. 4.  Aortic Atherosclerosis (ICD10-I70.0).        HISTORY OF PRESENTING ILLNESS: 06/05/17  Bobby Caldwell 62 y.o. male is here because of newly diagnosed Gastric Cancer. He was referred by Dr. [name]. He presents to the clinic today accompanied by an albanian interpretor and then helped by video interpretor [phone] to help translate his visit.   In the  past he was diagnosed with COPD controlled with inhalers. He has smoked for several years and is down to smoking 10 cigars a day. He is actively trying to quit.   Today he notes he is  upset about his diagnosis and he recently had bypass surgery on his bilateral legs in 04/2017 and feels fatigued from this. He met with Dr. [name] today and he was told he will need chemo and surgery. He is willing to go with whatever is recommended.  He notes this started with issues with both legs feeling weak and shaking. This occurred for 2-3 months. He had bypass surgery with Dr. Bridgett Larsson. He does not walk much so he has not tested how well he can walk much. The 03/2017 CT scan before his surgery showed thickening in his stomach which is why he was referred to Dr. [name]. Patient notes his stomach issues has been ongoing, but recently has burning sensation below umbilical area since endoscopy. This was only mildly presents prior to procedure. His appetite has not changed much. He doesn't dare to eat much as he is afraid to vomit. He has felt this way for th past 6 months. He currently loss 14 pounds in the past 6 months. He does not go out much anymore, he mostly stays in his house. He is able to do ALDs himself, his wife helps out around the house and cares for him. He has 4 children, 37-21yo. His wife does not work. He does not find himself feeling up to working anymore, he has no energy to do anything.  He notes pain in his chest from his COPD. He uses inhalers 3 times a day. He would like to do the surgery alone but if chemo is needed he is fine with proceeding with treatment. He reports his body shakes when he gets blood drawn. He is willing to try oral iron in the meantime. He would like to be seen by social worker to help him our with finances as he is not able to work. He feels he often has to break due to sever fatigue and weakness.     CURRENT THERAPY: neoadjuvant FOLFOX with leucovorin every 2 weeks for 6-8 cycles starting 07/07/17    INTERVAL HISTORY:  Bobby Caldwell is here for follow up. He is accompanied by his wife. He went to the ED and was admitted on 3 /4/19 for COPD exacerbation. He notes  he was short of breath and coughing up orange sputum. He reports he is doing better today and still experiencing symptoms but they are improved. He states he uses oxygen at night and during the day as needed. He endorses a good appetite on Marinol.   On review of systems, pt denies fever, or any other complaints at this time. Pertinent positives are listed and detailed within the above HPI.   MEDICAL HISTORY:  Past Medical History:  Diagnosis Date  . COPD (chronic obstructive pulmonary disease) ([place])   . Dyspnea   . PAD (peripheral artery disease) ([place])   . Stomach cancer ([place])   . Tobacco abuse   . Weight loss     SURGICAL HISTORY: Past Surgical History:  Procedure Laterality Date  . ABDOMINAL AORTOGRAM W/LOWER EXTREMITY N/A 05/03/2017   Procedure: ABDOMINAL AORTOGRAM W/LOWER EXTREMITY;  Surgeon: Conrad Floyd, MD;  Location: [facility];  Service: Cardiovascular;  Laterality: N/A;  . ESOPHAGOGASTRODUODENOSCOPY     05/24/17  . ESOPHAGOGASTRODUODENOSCOPY (EGD) WITH PROPOFOL N/A 05/24/2017   Procedure: ESOPHAGOGASTRODUODENOSCOPY (EGD)  WITH PROPOFOL;  Surgeon: [name], MD;  Location: [facility];  Service: Endoscopy;  Laterality: N/A;  . EUS N/A 06/07/2017   Procedure: UPPER ENDOSCOPIC ULTRASOUND (EUS) RADIAL;  Surgeon: [name], MD;  Location: [facility];  Service: Endoscopy;  Laterality: N/A;  . FEMORAL-FEMORAL BYPASS GRAFT Bilateral 05/08/2017   Procedure: LEFT TO RIGHT BYPASS GRAFT FEMORAL-FEMORAL ARTERY;  Surgeon: Conrad Cobbtown, MD;  Location: [facility];  Service: Vascular;  Laterality: Bilateral;  . IR FLUORO GUIDE PORT INSERTION RIGHT  07/05/2017  . IR US GUIDE VASC ACCESS RIGHT  07/05/2017  . NM MYOVIEW LTD  06/2017   LOW RISK. Small basal inferoseptal defect thought to be related to artifact (although cannot exclude prior infarct). Normal wall motion i in this area would suggest artifact not infarct. Otherwise no ischemia or infarction.  Marland Kitchen PERIPHERAL  VASCULAR INTERVENTION Left 05/03/2017   Procedure: PERIPHERAL VASCULAR INTERVENTION;  Surgeon: Conrad Hawarden, MD;  Location: [facility] [facility];  Service: Cardiovascular;  Laterality: Left;  common iliac  . TRANSTHORACIC ECHOCARDIOGRAM  06/26/2017   Normal LV size and function. EF 60-65%. No wall motion abnormalities. GR 1 DD. Mild LA dilation    SOCIAL HISTORY: Social History   Socioeconomic History  . Marital status: Married    Spouse name: Not on file  . Number of children: 4  . Years of education: Not on file  . Highest education level: Not on file  Social Needs  . Financial resource strain: Not on file  . Food insecurity - worry: Not on file  . Food insecurity - inability: Not on file  . Transportation needs - medical: Not on file  . Transportation needs - non-medical: Not on file  Occupational History  . Not on file  Tobacco Use  . Smoking status: Current Every Day Smoker    Packs/day: 1.00    Years: 50.00    Pack years: 50.00    Types: Cigarettes  . Smokeless tobacco: Never Used  . Tobacco comment: 10 cigarettes per day  Substance and Sexual Activity  . Alcohol use: No    Alcohol/week: 0.0 oz  . Drug use: No  . Sexual activity: Yes  Other Topics Concern  . Not on file  Social History Narrative  . Not on file    FAMILY HISTORY: Family History  Family history unknown: Yes    ALLERGIES:  has No Known Allergies.  MEDICATIONS:  Current Outpatient Medications  Medication Sig Dispense Refill  . Albuterol Sulfate (PROAIR RESPICLICK) 212 (90 Base) MCG/ACT AEPB Inhale 2 puffs into the lungs every 6 (six) hours as needed. 1 each 6  . atorvastatin (LIPITOR) 10 MG tablet Take 1 tablet (10 mg total) by mouth daily. (Patient not taking: Reported on 09/10/2017) 30 tablet 2  . budesonide-formoterol (SYMBICORT) 160-4.5 MCG/ACT inhaler Inhale 2 puffs into the lungs 2 (two) times daily. 1 Inhaler 6  . clopidogrel (PLAVIX) 75 MG tablet Take 1 tablet (75 mg total) by mouth  daily. (Patient not taking: Reported on 09/10/2017) 30 tablet 11  . dexamethasone (DECADRON) 4 MG tablet Take 2 tablets (8 mg total) by mouth daily. Start the day after chemotherapy for 2 days. Take with food. 30 tablet 1  . dronabinol (MARINOL) 2.5 MG capsule Take 1 capsule (2.5 mg total) by mouth 2 (two) times daily before a meal. 20 capsule 0  . lidocaine-prilocaine (EMLA) cream Apply to affected area once (Patient taking differently: Apply 1 application topically once as needed (For port-a-cath.). )  30 g 3  . methylPREDNISolone (MEDROL DOSEPAK) 4 MG TBPK tablet Take as directed 21 tablet 0  . nystatin (MYCOSTATIN) 100000 UNIT/ML suspension Take 5 mLs (500,000 Units total) by mouth 4 (four) times daily for 7 days. 240 mL 0  . ondansetron (ZOFRAN) 8 MG tablet Take 1 tablet (8 mg total) by mouth 2 (two) times daily as needed for refractory nausea / vomiting. Start on day 3 after chemotherapy. 30 tablet 1  . oxyCODONE-acetaminophen (ROXICET) 5-325 MG tablet Take 1 tablet by mouth every 6 (six) hours as needed for severe pain. (Patient not taking: Reported on 09/10/2017) 20 tablet 0  . prochlorperazine (COMPAZINE) 10 MG tablet Take 1 tablet (10 mg total) by mouth every 6 (six) hours as needed (Nausea or vomiting). 30 tablet 1  . Tiotropium Bromide Monohydrate (SPIRIVA RESPIMAT) 2.5 MCG/ACT AERS Inhale 2 puffs into the lungs daily. 1 Inhaler 6   Current Facility-Administered Medications  Medication Dose Route Frequency Provider Last Rate Last Dose  . sodium chloride flush (NS) 0.9 % injection 10 mL  10 mL Intracatheter PRN [name], MD   10 mL at 09/17/17 0900    REVIEW OF SYSTEMS:  Constitutional: Denies fevers, chills or abnormal night sweats  Eyes: Denies blurriness of vision, double vision or watery eyes Ears, nose, mouth, throat, and face: Denies mucositis or sore throat Respiratory: (+) chest pain from COPD (+) cough Cardiovascular: Denies palpitation or lower extremity swelling     Gastrointestinal:  Denies heartburn or change in bowel habits (+) nausea and vomiting from treatment (+) lower abdominal pain  Skin: Denies abnormal skin rashes (+) pain from port  Lymphatics: Denies new lymphadenopathy or easy bruising Neurological:Denies numbness, tingling or new weaknesses MSK: (+) bilateral leg weakness/slight pain  Behavioral/Psych: Mood is stable, no new changes  All other systems were reviewed with the patient and are negative.   PHYSICAL EXAMINATION:  ECOG PERFORMANCE STATUS: 2 - Symptomatic, <50% confined to bed  Vitals:   09/17/17 0830  BP: 137/88  Pulse: 89  Resp: 17  Temp: 97.9 F (36.6 C)  SpO2: 96%   Filed Weights   09/17/17 0830  Weight: 111 lb (50.3 kg)    GENERAL:alert, no distress and comfortable SKIN: skin color, texture, turgor are normal, no rashes or significant lesions (+) mild erythema at port site, no sign of infection  EYES: normal, conjunctiva are pink and non-injected, sclera clear OROPHARYNX:no exudate, no erythema and lips, buccal mucosa, and tongue normal  NECK: supple, thyroid normal size, non-tender, without nodularity LYMPH:  no palpable lymphadenopathy in the cervical, axillary or inguinal LUNGS: clear to auscultation and percussion with normal breathing effort (+) decreased breast sounds bilaterally.   HEART: regular rate & rhythm and no murmurs and no lower extremity edema ABDOMEN:abdomen soft, non-tender and normal bowel sounds  Musculoskeletal:no cyanosis of digits and no clubbing  PSYCH: alert & oriented x 3 with fluent speech NEURO: no focal motor/sensory deficits  LABORATORY DATA:  I have reviewed the data as listed CBC Latest Ref Rng & Units 09/17/2017 09/12/2017 09/11/2017  WBC 4.0 - 10.3 K/uL 10.4(H) 8.2 10.2  Hemoglobin 13.0 - 17.1 g/dL 14.3 12.4(L) 12.8(L)  Hematocrit 38.4 - 49.9 % 43.6 39.6 41.2  Platelets 140 - 400 K/uL 179 213 214     CMP Latest Ref Rng & Units 09/17/2017 09/12/2017 09/11/2017  Glucose 70 -  140 mg/dL 93 138(H) 116(H)  BUN 7 - 26 mg/dL _0 Creatinine 0.70 - 1.30 mg/dL 0.62(L)  0.51(L) 0.42(L)  Sodium 136 - 145 mmol/L 140 137 137  Potassium 3.5 - 5.1 mmol/L 3.9 5.1 5.1  Chloride 98 - 109 mmol/L 101 97(L) 98(L)  CO2 22 - 29 mmol/L 31(H) 32 32  Calcium 8.4 - 10.4 mg/dL 8.9 8.7(L) 8.3(L)  Total Protein 6.4 - 8.3 g/dL 6.4 5.6(L) 5.9(L)  Total Bilirubin 0.2 - 1.2 mg/dL 0.6 0.6 0.7  Alkaline Phos 40 - 150 U/L 91 67 74  AST 5 - 34 U/L 18 16 14(L)  ALT 0 - 55 U/L 50 17 20    CEA  06/19/17: 3.38 07/06/17: 2.88   PATHOLOGY  Diagnosis 05/24/17  1. Stomach, biopsy, mass distal - ADENOCARCINOMA, SEE COMMENT. - CHRONIC ACTIVE GASTRITIS WITH INTESTINAL METAPLASIA. 2. 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 - NO DYSPLASIA OR MALIGNANCY. Microscopic Comment 1. The fragments are superficial and therefore assessment of invasion is hampered. Dr. Saralyn Pilar has reviewed the case. The case was called to Dr. [name] on 05/25/2017. 2. A Warthin-Starry stain is performed to determine the possibility of the presence of Helicobacter pylori. The Warthin-Starry stain is negative for organisms of Helicobacter pylori   PROCEDURES  EUS by Dr. [name] on 06/07/17  IMPRESSION:  5cm across, partially circumferential uT2N0 gastric adenocarcinoma laying in the distal stomach 3-4cm proximal to the pylorus.    EGD by Dr. [name] 05/24/17 IMPRESSION Ulcerated mass like region in the distal stomach (4cm across), extensively biopsied. This appears neoplastic. - Abnormal mucosa throughout stomach otherwise (mild-moderate gastritis), also extensively biopsied.   RADIOGRAPHIC STUDIES: I have personally reviewed the radiological images as listed and agreed with the findings in the report. Dg Chest 1 View  Result Date: 09/10/2017 CLINICAL DATA:  Sensation of food being stuck in the chest. EXAM: CHEST 1 VIEW  COMPARISON:  Chest x-ray of May 08, 2017 FINDINGS: The lungs remain hyperinflated. There is no focal infiltrate. There is no pleural effusion. The heart and pulmonary vascularity are normal. The mediastinum is normal in width. The trachea is midline. The porta catheter tip projects over the distal third of the SVC. The bony thorax exhibits no acute abnormality. IMPRESSION: Chronic bronchitic-smoking related changes, stable. No acute pneumonia nor CHF. No mediastinal abnormality is observed. Electronically Signed   By: David  Martinique M.D.   On: 09/10/2017 12:33   Dg Chest 2 View  Result Date: 09/11/2017 CLINICAL DATA:  Pneumonia, COPD, smoker EXAM: CHEST - 2 VIEW COMPARISON:  09/10/2017 FINDINGS: RIGHT jugular Port-A-Cath with tip projecting over SVC. Normal heart size, mediastinal contours, and pulmonary vascularity. Emphysematous and bronchitic changes consistent with COPD. Bibasilar atelectasis. Lungs clear. No definite acute infiltrate, pleural effusion, or pneumothorax. Bones demineralized. IMPRESSION: COPD changes with bibasilar atelectasis. No acute infiltrate. Electronically Signed   By: Lavonia Dana M.D.   On: 09/11/2017 15:42   Ct Angio Chest Pe W/cm &/or Wo Cm  Result Date: 09/10/2017 CLINICAL DATA:  Gastric cancer, shortness of breath, fever. EXAM: CT ANGIOGRAPHY CHEST WITH CONTRAST TECHNIQUE: Multidetector CT imaging of the chest was performed using the standard protocol during bolus administration of intravenous contrast. Multiplanar CT image reconstructions and MIPs were obtained to evaluate the vascular anatomy. CONTRAST:  11m ISOVUE-370 IOPAMIDOL (ISOVUE-370) INJECTION 76% COMPARISON:  10/21/2016. FINDINGS: Cardiovascular: Negative for pulmonary embolus. Right IJ Port-A-Cath terminates at the SVC RA junction. Heart size normal. No pericardial effusion. Mediastinum/Nodes: No pathologically enlarged mediastinal, hilar or axillary lymph nodes. Esophagus is grossly unremarkable. Lungs/Pleura:  Moderate to severe centrilobular  emphysema. There may be a small amount of fluid tracking along the minor fissure. Image quality is degraded by respiratory motion. Peribronchovascular consolidation and nodularity in the lower lobes are new. Scattered subpleural lymph nodes along the fissures. 5 mm apical left upper lobe nodule, stable. Calcified granulomas. No pleural fluid. Debris seen in the left mainstem bronchus and left lower lobe bronchi. Upper Abdomen: Visualized portions of the liver, adrenal glands, kidneys, spleen, pancreas, stomach and bowel are grossly unremarkable. No upper abdominal adenopathy. Musculoskeletal: No worrisome lytic or sclerotic lesions. Review of the MIP images confirms the above findings. IMPRESSION: 1. Negative for pulmonary embolus. 2. New peribronchovascular consolidation and nodularity in both lower lobes, most indicative of an infectious bronchiolitis/bronchopneumonia. 3.  Emphysema (ICD10-J43.9). Electronically Signed   By: Lorin Picket M.D.   On: 09/10/2017 15:15   Ct Abdomen Pelvis W Contrast  Result Date: 09/10/2017 CLINICAL DATA:  Shortness of breath.  History gastric cancer. EXAM: CT ABDOMEN AND PELVIS WITH CONTRAST TECHNIQUE: Multidetector CT imaging of the abdomen and pelvis was performed using the standard protocol following bolus administration of intravenous contrast. CONTRAST:  130m ISOVUE-370 IOPAMIDOL (ISOVUE-370) INJECTION 76% COMPARISON:  06/22/2017. FINDINGS: Lower chest: No pleural effusion. Multiple new nodular densities are identified within both lung bases. Index nodule within the right base measures 1.4 cm, image 28/series 4. New from previous exam. Density within the posterior right lower lobe measures 1.1 cm, image 23 of series 4. Also new from previous exam. Anterior right lower lobe lung nodular density measures 0.9 cm, image 21 of series 4. New from previous exam. Hepatobiliary: No focal liver abnormality. The gallbladder appears normal. No biliary  dilatation. Pancreas: Normal appearance of the pancreas. Spleen: The spleen is normal. Adrenals/Urinary Tract: The adrenal glands are unremarkable. Normal appearance of both kidneys. The urinary bladder is normal. Stomach/Bowel: Previous gastric mass is inconspicuous on today's study. The small bowel loops have a normal course and caliber. The appendix is visualized and appears normal. No pathologic dilatation of the colon. Vascular/Lymphatic: Aortic atherosclerosis. No aneurysm. No upper abdominal adenopathy identified. 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 A stent is identified in the left common iliac artery. A femoral bypass graft is identified. Reproductive: Prostate is unremarkable. Other: No abdominal wall hernia or abnormality. No abdominopelvic ascites. Musculoskeletal: No aggressive lytic or sclerotic bone lesions. IMPRESSION: 1. No acute findings identified within the abdomen or pelvis. 2. Interval development of multiple bilateral pulmonary densities within both lower lobes. These are indeterminate, likely post infectious or inflammatory in etiology. Atypical infection not excluded. Metastatic disease is considered less favored but not entirely excluded and follow-up imaging to ensure resolution is advised. 3. No evidence to suggest recurrent tumor or metastatic disease within the abdomen or pelvis. 4.  Aortic Atherosclerosis (ICD10-I70.0). Electronically Signed   By: TKerby MoorsM.D.   On: 09/10/2017 15:11   Dg Esophagus  Result Date: 09/11/2017 CLINICAL DATA:  Sensation of food sticking in the throat. Patient reports focal left upper chest wall pain when eating. History of gastric adenocarcinoma. EXAM: ESOPHOGRAM/BARIUM SWALLOW TECHNIQUE: Single contrast examination was performed using  thin barium. FLUOROSCOPY TIME:  Fluoroscopy Time:  1.3 minutes Radiation Exposure Index (if provided by the fluoroscopic device): 2.2 mGy Number of Acquired Spot Images: 0  COMPARISON:  None. FINDINGS: Lateral pharyngeal imaging shows normal swallowing. There was no aspiration or obstructive process. Esophagus has normal distensibility and smooth mucosal contour. A 13 mm barium tablet was swallowed successfully. No specific dysmotility  was seen, including diffuse esophageal spasm as a cause of left chest pain when eating. No noted reflux or hiatal hernia. History of gastric adenocarcinoma. The stomach was not over distended. IMPRESSION: Negative exam.  No stricture or dysmotility. Electronically Signed   By: Monte Fantasia M.D.   On: 09/11/2017 16:21     CT AP W Contrast 09/10/17 IMPRESSION: 1. No acute findings identified within the abdomen or pelvis. 2. Interval development of multiple bilateral pulmonary densities within both lower lobes. These are indeterminate, likely post infectious or inflammatory in etiology. Atypical infection not excluded. Metastatic disease is considered less favored but not entirely excluded and follow-up imaging to ensure resolution is advised. 3. No evidence to suggest recurrent tumor or metastatic disease within the abdomen or pelvis. 4.  Aortic Atherosclerosis (ICD10-I70.0).  CT CAP W Contrast 06/22/17  IMPRESSION: 1. The gastric mass is somewhat inconspicuous on CT, but a potential 1.2 cm thick mucosal masses seen along the posterior gastric border on image 83/2. There several small adjacent right gastric lymph nodes measuring up to 0.7 cm in short axis, but no overtly pathologic adenopathy and no findings of hepatic or peritoneal metastatic disease at this time. 2. Calcified granulomas in both lungs along with several noncalcified nodules stable from August 2016 and likely benign given this long-term stability. 3. Other imaging findings of potential clinical significance: Aortic Atherosclerosis (ICD10-I70.0) and Emphysema (ICD10-J43.9). Airway thickening is present, suggesting bronchitis or reactive airways disease.  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 Disc bulges at L3-4 and L4-5.   CT CAP 04/02/17 IMPRESSION: 1. Aortic atherosclerosis with fusiform ectasia of the infrarenal abdominal aorta which measures up to 2.6 x 2.3 cm, and contains a large burden of mural thrombus. 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 2. Today's study was not a CTA examination, which limits assessment of vascular abnormalities. Additionally, secondary to extensive patient respiratory motion, accurate assessment of small vessels is limited. With these limitations in mind, there is potential stenosis of both the origin of the celiac axis and the origin of the inferior mesenteric artery. Given the patient's history of intermittent abdominal pain, further evaluation with CTA of the abdomen and pelvis should be considered if clinically appropriate, and if the patient can be counseled to adequately hold his breath during the examination to allow for optimal image quality. 3. No other acute findings are noted in the chest, abdomen or pelvis to account for the patient's symptoms. 4. Diffuse bronchial wall thickening with moderate centrilobular and paraseptal emphysema; imaging findings suggestive of underlying COPD. 5. Multiple small pulmonary nodules measuring up to 6 mm, stable compared to prior examinations dating back take 02/22/2015, considered benign. No future follow-up needed. This recommendation follows the consensus statement: Guidelines for Management of Incidental Pulmonary Nodules Detected on CT Images: From the Fleischner Society 2017; Radiology 2017; 284:228-243. 6. Additional incidental findings, as above. Aortic Atherosclerosis (ICD10-I70.0) and Emphysema (ICD10-J43.9).   ASSESSMENT & PLAN:  Bobby Caldwell is a 62 y.o. [place] male with a  history of heavy smoking and COPD, peripheral vascular disease, presents with fatigue, anorexia, early satiety and weight loss   1. Gastric Adenocarcinoma, uT2N0M0, stage I -I reviewed his EGD findings and biopsy pathology results that show gastric adenocarcinoma. He had CT chest, abdomen and pelvis with contrast for his peripheral vascular disease on 04/02/2017, which shows no sign of metastasis  at that time.  -I will repeat staging scan to rule out new distant metastasis since last scan.  -His EUS showed a T2N0 lesion in distal gastric body -Due to his intensive peripheral vascular disease and recent bypass surgery, poor nutrition status, Dr. [name] not feel he is a great surgical candidate at this point, she recommended him to improve his nutrition status and to consider neoadjuvant chemotherapy first.  Giving the T2 lesion, neoadjuvant chemotherapy is reasonable and I agree with Dr. [name]. -The periop chemo FLOT4 (5-fu, oxaliplatin, and docetaxel) has showed superior outcome for gastric cancer. However, due to his poor nutrition status, comorbidities, and poor performance status, he may not be able to tolerate such intensive chemotherapy.  I will consider FOLFOX as the first cycle of neoadjuvant chemo, his nutrition status improved and he tolerates well, will consider change to FLOT4 -His cardiac workup with Dr. Ellyn Hack has been completed.  He has low possibility of cardiac ischemia and he is cleared to proceed with chemotherapy -We reviewed his CT CAP from 06/22/17 showed no other metastatic disease.  -He has started neoadjuvant FOLFOX every 2 weeks on 07/07/17. He tolerated moderately well, with some fatigue, nausea, dizziness (probable related to medication) -He has has started neoadjuvant chemo FOLFOX.  His tolerance has been moderate to poor.  He has developed COPD exacerbation after cycle 3, started home oxygen by his pulmonologist, he was hospitalized for COPD exacerbation after cycle 4  chemotherapy. -Cycle 4 of FOLFOX held on 09/03/17 due worsening peripheral edema and vague skin rash. Rescheduled for 09/06/17. During Cycle 4, pt experienced Hypoxia. Pt's symptoms were managed by Lucianne Lei.  -Due to his poor tolerance to chemotherapy, I will discuss his case with Dr. [name], he may be a candidate for surgery soon. His CT AP W Contrast from 09/10/17 reveals infiltrative change in the lung bases, likely related to COPD and infection.  No scan evidence of metastatic disease or disease progression.  I discussed with patient and his wife today. -I will schedule chemo for 09/24/17, possible last cycle before surgery   2. Anorexia, Insomnia, nausea and weight loss  -He has loss 14 pounds over past 6 months  -f/u with dietician  -He currently takes ensure boosts daily.  -He has been gaining weight recently. I encouraged him to continue with supplement to gain more weight.  -I reviewed his antiemetic regimen, and encouraged him to take medication 30 minutes before each meal as needed.  -I prescribed mirtazapine to help with his appetite.  -His appetite is much improved from mirtazapine -He can increase to take mirtazapine twice at night to help with sleep. -Lacie prescribed Marinol on 09/03/17 and he states he appetite has improved   3. COPD, Smoking Cessation  -He reduced from 50 cigars a day to 10 cigars a day. He is actively trying to quit. He has mild chronic cough  -I previously discussed his chest pain is due to his cough from COPD and smoking.  -he is down to 6-7 cigars a day, I again encouraged him to completely quit  -Admitted to the hospital on 09/10/17 for an exacerbation   4. Financial support -I will refer him to our social worker for help with financial help, he is no longer able to work and would like apply for disability.   -His wife has not returned to work to help him at home. I recommend she apply for FMLA at her place of work.  -He will need assistance with transportation.    -  He was seen by our SW   5. Personality change, Depression and or anxiety -Patient's daughter called me after his visit, she is concerned about his personality change lately, he has been agitated, gets upset quickly, and not sleeping well.  Daughter is concerned about depression and anxiety.  I think this is possible related to steroids, and or his side effect from chemo.   -I encouraged daughter to watch him at home, and call us if she has concerns. -Patient denied depression, he was seen by our social worker, and he declined counseling. -Has Ativan as needed for insomnia.  6. Lower extremity/pedal edema with skin rash, right hand/wrist edema  -Has new onset edema, lower extremity rash, with associated wrist and ankle joint aches on 09/03/17 visit. S/p peripheral vascular catheterization 04/2017 but doubt this is related. Could be arthritic. Will treat with medrol dose pack and monitor.    PLAN:  -schedule next chemo on 09/24/17 with lab and follow up -I will discuss his surgical options with Dr. [name] on tumor board this week   No orders of the defined types were placed in this encounter.   All questions were answered. The patient knows to call the clinic with any problems, questions or concerns. I spent 20 minutes counseling the patient face to face. The total time spent in the appointment was 25 minutes and more than 50% was on counseling.  This document serves as a record of services personally performed by [name], MD. It was created on her behalf by Theresia Bough, a trained medical scribe. The creation of this record is based on the scribe's personal observations and the provider's statements to them.   I have reviewed the above documentation for accuracy and completeness, and I agree with the above.    [name], MD 09/17/2017 3:11 PM

## 2017-09-17 ENCOUNTER — Inpatient Hospital Stay: Payer: BLUE CROSS/BLUE SHIELD

## 2017-09-17 ENCOUNTER — Encounter: Payer: Self-pay | Admitting: Hematology

## 2017-09-17 ENCOUNTER — Inpatient Hospital Stay (HOSPITAL_BASED_OUTPATIENT_CLINIC_OR_DEPARTMENT_OTHER): Payer: BLUE CROSS/BLUE SHIELD | Admitting: Hematology

## 2017-09-17 VITALS — BP 137/88 | HR 89 | Temp 97.9°F | Resp 17 | Ht 66.0 in | Wt 111.0 lb

## 2017-09-17 DIAGNOSIS — C160 Malignant neoplasm of cardia: Secondary | ICD-10-CM | POA: Diagnosis not present

## 2017-09-17 DIAGNOSIS — F17210 Nicotine dependence, cigarettes, uncomplicated: Secondary | ICD-10-CM

## 2017-09-17 DIAGNOSIS — Z95828 Presence of other vascular implants and grafts: Secondary | ICD-10-CM

## 2017-09-17 DIAGNOSIS — C162 Malignant neoplasm of body of stomach: Secondary | ICD-10-CM

## 2017-09-17 DIAGNOSIS — J449 Chronic obstructive pulmonary disease, unspecified: Secondary | ICD-10-CM

## 2017-09-17 LAB — COMPREHENSIVE METABOLIC PANEL
ALBUMIN: 3.4 g/dL — AB (ref 3.5–5.0)
ALT: 50 U/L (ref 0–55)
AST: 18 U/L (ref 5–34)
Alkaline Phosphatase: 91 U/L (ref 40–150)
Anion gap: 8 (ref 3–11)
BILIRUBIN TOTAL: 0.6 mg/dL (ref 0.2–1.2)
BUN: 12 mg/dL (ref 7–26)
CHLORIDE: 101 mmol/L (ref 98–109)
CO2: 31 mmol/L — ABNORMAL HIGH (ref 22–29)
CREATININE: 0.62 mg/dL — AB (ref 0.70–1.30)
Calcium: 8.9 mg/dL (ref 8.4–10.4)
GFR calc Af Amer: >60 mL/min (ref >60)
GLUCOSE: 93 mg/dL (ref 70–140)
Potassium: 3.9 mmol/L (ref 3.5–5.1)
Sodium: 140 mmol/L (ref 136–145)
Total Protein: 6.4 g/dL (ref 6.4–8.3)

## 2017-09-17 LAB — CBC WITH DIFFERENTIAL/PLATELET
BASOS ABS: 0 10*3/uL (ref 0.0–0.1)
Basophils Relative: 0 %
Eosinophils Absolute: 0.2 10*3/uL (ref 0.0–0.5)
Eosinophils Relative: 2 %
HEMATOCRIT: 43.6 % (ref 38.4–49.9)
Hemoglobin: 14.3 g/dL (ref 13.0–17.1)
LYMPHS PCT: 20 %
Lymphs Abs: 2.1 10*3/uL (ref 0.9–3.3)
MCH: 28.6 pg (ref 27.2–33.4)
MCHC: 32.9 g/dL (ref 32.0–36.0)
MCV: 86.8 fL (ref 79.3–98.0)
Monocytes Absolute: 0.8 10*3/uL (ref 0.1–0.9)
Monocytes Relative: 8 %
NEUTROS ABS: 7.2 10*3/uL — AB (ref 1.5–6.5)
Neutrophils Relative %: 70 %
PLATELETS: 179 10*3/uL (ref 140–400)
RBC: 5.02 MIL/uL (ref 4.20–5.82)
RDW: 16.9 % — ABNORMAL HIGH (ref 11.0–14.6)
WBC: 10.4 10*3/uL — AB (ref 4.0–10.3)

## 2017-09-17 MED ORDER — HEPARIN SOD (PORK) LOCK FLUSH 100 UNIT/ML IV SOLN
500.0000 [IU] | Freq: Once | INTRAVENOUS | Status: AC | PRN
  Administered 2017-09-17: 500 [IU]
  Filled 2017-09-17: qty 5

## 2017-09-17 MED ORDER — SODIUM CHLORIDE 0.9% FLUSH
10.0000 mL | Freq: Once | INTRAVENOUS | Status: AC
  Administered 2017-09-17: 10 mL
  Filled 2017-09-17: qty 10

## 2017-09-17 MED ORDER — SODIUM CHLORIDE 0.9% FLUSH
10.0000 mL | INTRAVENOUS | Status: DC | PRN
  Administered 2017-09-17: 10 mL
  Filled 2017-09-17: qty 10

## 2017-09-18 ENCOUNTER — Telehealth: Payer: Self-pay | Admitting: Hematology

## 2017-09-18 NOTE — Telephone Encounter (Signed)
1:52 pm spoke to patient's daughter and confirmed Disability paperwork has been successfully faxed to [place] @ [phone]

## 2017-09-19 ENCOUNTER — Encounter: Payer: Self-pay | Admitting: Radiation Oncology

## 2017-09-24 ENCOUNTER — Other Ambulatory Visit: Payer: Self-pay | Admitting: Hematology

## 2017-09-24 ENCOUNTER — Inpatient Hospital Stay: Payer: BLUE CROSS/BLUE SHIELD

## 2017-09-24 ENCOUNTER — Telehealth: Payer: Self-pay

## 2017-09-24 ENCOUNTER — Encounter: Payer: Self-pay | Admitting: Nurse Practitioner

## 2017-09-24 ENCOUNTER — Inpatient Hospital Stay (HOSPITAL_BASED_OUTPATIENT_CLINIC_OR_DEPARTMENT_OTHER): Payer: BLUE CROSS/BLUE SHIELD | Admitting: Nurse Practitioner

## 2017-09-24 VITALS — BP 130/79 | HR 101 | Temp 97.5°F | Resp 18 | Ht 66.0 in | Wt 112.0 lb

## 2017-09-24 DIAGNOSIS — C160 Malignant neoplasm of cardia: Secondary | ICD-10-CM

## 2017-09-24 DIAGNOSIS — J449 Chronic obstructive pulmonary disease, unspecified: Secondary | ICD-10-CM | POA: Diagnosis not present

## 2017-09-24 DIAGNOSIS — F17210 Nicotine dependence, cigarettes, uncomplicated: Secondary | ICD-10-CM

## 2017-09-24 DIAGNOSIS — Z95828 Presence of other vascular implants and grafts: Secondary | ICD-10-CM

## 2017-09-24 DIAGNOSIS — C162 Malignant neoplasm of body of stomach: Secondary | ICD-10-CM

## 2017-09-24 LAB — COMPREHENSIVE METABOLIC PANEL
ALBUMIN: 3.2 g/dL — AB (ref 3.5–5.0)
ALK PHOS: 100 U/L (ref 40–150)
ALT: 30 U/L (ref 0–55)
ANION GAP: 6 (ref 3–11)
AST: 17 U/L (ref 5–34)
BUN: 8 mg/dL (ref 7–26)
CALCIUM: 9.1 mg/dL (ref 8.4–10.4)
CO2: 29 mmol/L (ref 22–29)
Chloride: 102 mmol/L (ref 98–109)
Creatinine, Ser: 0.58 mg/dL — ABNORMAL LOW (ref 0.70–1.30)
GFR calc Af Amer: >60 mL/min (ref >60)
GLUCOSE: 141 mg/dL — AB (ref 70–140)
Potassium: 3.8 mmol/L (ref 3.5–5.1)
Sodium: 137 mmol/L (ref 136–145)
Total Bilirubin: 0.3 mg/dL (ref 0.2–1.2)
Total Protein: 6.6 g/dL (ref 6.4–8.3)

## 2017-09-24 LAB — CBC WITH DIFFERENTIAL/PLATELET
Basophils Absolute: 0 10*3/uL (ref 0.0–0.1)
Basophils Relative: 1 %
EOS ABS: 0.3 10*3/uL (ref 0.0–0.5)
Eosinophils Relative: 5 %
HCT: 42.1 % (ref 38.4–49.9)
HEMOGLOBIN: 14.1 g/dL (ref 13.0–17.1)
Lymphocytes Relative: 28 %
Lymphs Abs: 1.7 10*3/uL (ref 0.9–3.3)
MCH: 29.2 pg (ref 27.2–33.4)
MCHC: 33.5 g/dL (ref 32.0–36.0)
MCV: 87.2 fL (ref 79.3–98.0)
MONOS PCT: 18 %
Monocytes Absolute: 1.1 10*3/uL — ABNORMAL HIGH (ref 0.1–0.9)
NEUTROS ABS: 3 10*3/uL (ref 1.5–6.5)
Neutrophils Relative %: 48 %
Platelets: 174 10*3/uL (ref 140–400)
RBC: 4.83 MIL/uL (ref 4.20–5.82)
RDW: 16.5 % — ABNORMAL HIGH (ref 11.0–14.6)
WBC: 6.2 10*3/uL (ref 4.0–10.3)

## 2017-09-24 LAB — PREALBUMIN: Prealbumin: 21.9 mg/dL (ref 18–38)

## 2017-09-24 MED ORDER — SODIUM CHLORIDE 0.9% FLUSH
10.0000 mL | Freq: Once | INTRAVENOUS | Status: AC
  Administered 2017-09-24: 10 mL
  Filled 2017-09-24: qty 10

## 2017-09-24 NOTE — Telephone Encounter (Signed)
Printed avs and calender of upcoming appointment. Per 3/18 los 

## 2017-09-24 NOTE — Progress Notes (Addendum)
[facility]  Telephone:([phone]) [phone] Fax:([phone]) [phone]  Clinic Follow up Note   Patient Care Team: Minette Brine as PCP - General (General Practice) [name], MD as Consulting Physician (Pulmonary Disease) [name], MD as Consulting Physician (Hematology) [name], MD as Attending Physician (Gastroenterology) [name], MD as Consulting Physician (General Surgery) 09/24/2017  SUMMARY OF ONCOLOGIC HISTORY: Oncology History   Cancer Staging Gastric cancer [facility]) Staging form: Stomach, AJCC 8th Edition - Clinical stage from 05/24/2017: Stage I (cT2, cN0, cM0) - Signed by [name], MD on 06/19/2017       Gastric cancer ([place])   05/24/2017 Procedure    EGD by Dr. [name] 05/24/17 IMPRESSION Ulcerated mass like region in the distal stomach (4cm across), extensively biopsied. This appears neoplastic. - Abnormal mucosa throughout stomach otherwise (mild-moderate gastritis), also extensively biopsied.      05/24/2017 Initial Biopsy    Diagnosis 05/24/17  1. Stomach, biopsy, mass distal - ADENOCARCINOMA, SEE COMMENT. - CHRONIC ACTIVE GASTRITIS WITH INTESTINAL METAPLASIA. 2. 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 - NO DYSPLASIA OR MALIGNANCY      06/04/2017 Initial Diagnosis    Gastric cancer ([place])      06/07/2017 Procedure    EUS by Dr. [name] on 06/07/17  IMPRESSION:  5cm across, partially circumferential uT2N0 gastric adenocarcinoma laying in the distal stomach 3-4cm proximal to the pylorus.      06/22/2017 Imaging    CT CAP W Contrast 06/22/17  IMPRESSION: 1. The gastric mass is somewhat inconspicuous on CT, but a potential 1.2 cm thick mucosal masses seen along the posterior gastric border on image 83/2. There several small adjacent right gastric lymph nodes measuring up to 0.7 cm in short axis, but no overtly pathologic adenopathy and no findings  of hepatic or peritoneal metastatic disease at this time. 2. Calcified granulomas in both lungs along with several noncalcified nodules stable from August 2016 and likely benign given this long-term stability. 3. Other imaging findings of potential clinical significance: Aortic Atherosclerosis (ICD10-I70.0) and Emphysema (ICD10-J43.9). Airway thickening is present, suggesting bronchitis or reactive airways disease. 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 Disc bulges at L3-4 and L4-5.      07/05/2017 Surgery    PAC placement       07/07/2017 -  Chemotherapy    Neoadjuvant FOLFOX with with leucovorin every 2 weeks for 6 cycles starting 07/07/17. Will start with reduced dose for cycle 1.       09/10/2017 Imaging     CT AP W Contrast 09/10/17 IMPRESSION: 1. No acute findings identified within the abdomen or pelvis. 2. Interval development of multiple bilateral pulmonary densities within both lower lobes. These are indeterminate, likely post infectious or inflammatory in etiology. Atypical infection not excluded. Metastatic disease is considered less favored but not entirely excluded and follow-up imaging to ensure resolution is advised. 3. No evidence to suggest recurrent tumor or metastatic disease within the abdomen or pelvis. 4.  Aortic Atherosclerosis (ICD10-I70.0).     CURRENT THERAPY: neoadjuvant FOLFOX every 2 weeks for 6-8 cycles starting 07/07/17   INTERVAL HISTORY: Bobby Caldwell returns for follow up as scheduled prior to cycle 5 FOLFOX. He continues to have low energy but slightly improved, he was able to tolerate light yard work this weekend with his wife's help. PT/OT comes to his home twice per day; he doesn't know how long this  will continue. Still gets significantly out of breath with walking short distances. His supplemental oxygen needs have decreased, uses O2 intermittently for brief periods of  time. Productive cough is improving, sputum still orange. He completed all medications from hospital discharge. Continues to smoke cigarettes, down to 7 per day. Has intermittent nausea, not clear if he is using anti-emetics at home. Vomited up "nicotine" last week one time. Appetite is fair, eating small amounts and drinking well. Regular bowel movements. No abdominal pain. No numbness or tingling.   REVIEW OF SYSTEMS:   Constitutional: Denies fevers, chills or abnormal weight loss (+) moderate fatigue, improving Eyes: Denies blurriness of vision Ears, nose, mouth, throat, and face: Denies mucositis or sore throat Respiratory: Denies wheezes (+) productive cough, yellow sputum; improving (+) DOE Cardiovascular: Denies palpitation, chest discomfort or lower extremity swelling Gastrointestinal:  Denies abdominal pain, heartburn or change in bowel habits (+) emesis x1 last week (+) intermittent nausea  Skin: Denies abnormal skin rashes Lymphatics: Denies new lymphadenopathy or easy bruising Neurological:Denies numbness, tingling (+) generalized weakness, slight improvement with PT/OT Behavioral/Psych: Mood is stable, no new changes (+) sleeping well, on remeron  All other systems were reviewed with the patient and are negative.  MEDICAL HISTORY:  Past Medical History:  Diagnosis Date  . COPD (chronic obstructive pulmonary disease) ([place])   . Dyspnea   . PAD (peripheral artery disease) ([place])   . Stomach cancer ([place])   . Tobacco abuse   . Weight loss     SURGICAL HISTORY: Past Surgical History:  Procedure Laterality Date  . ABDOMINAL AORTOGRAM W/LOWER EXTREMITY N/A 05/03/2017   Procedure: ABDOMINAL AORTOGRAM W/LOWER EXTREMITY;  Surgeon: Conrad Elliston, MD;  Location: [facility];  Service: Cardiovascular;  Laterality: N/A;  . ESOPHAGOGASTRODUODENOSCOPY     05/24/17  . ESOPHAGOGASTRODUODENOSCOPY (EGD) WITH PROPOFOL N/A 05/24/2017   Procedure: ESOPHAGOGASTRODUODENOSCOPY (EGD) WITH  PROPOFOL;  Surgeon: [name], MD;  Location: [facility];  Service: Endoscopy;  Laterality: N/A;  . EUS N/A 06/07/2017   Procedure: UPPER ENDOSCOPIC ULTRASOUND (EUS) RADIAL;  Surgeon: [name], MD;  Location: [facility];  Service: Endoscopy;  Laterality: N/A;  . FEMORAL-FEMORAL BYPASS GRAFT Bilateral 05/08/2017   Procedure: LEFT TO RIGHT BYPASS GRAFT FEMORAL-FEMORAL ARTERY;  Surgeon: Conrad Tolchester, MD;  Location: [facility];  Service: Vascular;  Laterality: Bilateral;  . IR FLUORO GUIDE PORT INSERTION RIGHT  07/05/2017  . IR US GUIDE VASC ACCESS RIGHT  07/05/2017  . NM MYOVIEW LTD  06/2017   LOW RISK. Small basal inferoseptal defect thought to be related to artifact (although cannot exclude prior infarct). Normal wall motion i in this area would suggest artifact not infarct. Otherwise no ischemia or infarction.  Marland Kitchen PERIPHERAL VASCULAR INTERVENTION Left 05/03/2017   Procedure: PERIPHERAL VASCULAR INTERVENTION;  Surgeon: Conrad McRae, MD;  Location: [facility];  Service: Cardiovascular;  Laterality: Left;  common iliac  . TRANSTHORACIC ECHOCARDIOGRAM  06/26/2017   Normal LV size and function. EF 60-65%. No wall motion abnormalities. GR 1 DD. Mild LA dilation    I have reviewed the social history and family history with the patient and they are unchanged from previous note.  ALLERGIES:  has No Known Allergies.  MEDICATIONS:  Current Outpatient Medications  Medication Sig Dispense Refill  . Albuterol Sulfate (PROAIR RESPICLICK) 703 (90 Base) MCG/ACT AEPB Inhale 2 puffs into the lungs every 6 (six) hours as needed. 1 each 6  . atorvastatin (LIPITOR) 10 MG tablet Take 1  tablet (10 mg total) by mouth daily. 30 tablet 2  . budesonide-formoterol (SYMBICORT) 160-4.5 MCG/ACT inhaler Inhale 2 puffs into the lungs 2 (two) times daily. 1 Inhaler 6  . clopidogrel (PLAVIX) 75 MG tablet Take 1 tablet (75 mg total) by mouth daily. 30 tablet 11  . dexamethasone (DECADRON) 4 MG tablet  Take 2 tablets (8 mg total) by mouth daily. Start the day after chemotherapy for 2 days. Take with food. 30 tablet 1  . dronabinol (MARINOL) 2.5 MG capsule Take 1 capsule (2.5 mg total) by mouth 2 (two) times daily before a meal. 20 capsule 0  . lidocaine-prilocaine (EMLA) cream Apply to affected area once (Patient taking differently: Apply 1 application topically once as needed (For port-a-cath.). ) 30 g 3  . methylPREDNISolone (MEDROL DOSEPAK) 4 MG TBPK tablet Take as directed 21 tablet 0  . ondansetron (ZOFRAN) 8 MG tablet Take 1 tablet (8 mg total) by mouth 2 (two) times daily as needed for refractory nausea / vomiting. Start on day 3 after chemotherapy. 30 tablet 1  . oxyCODONE-acetaminophen (ROXICET) 5-325 MG tablet Take 1 tablet by mouth every 6 (six) hours as needed for severe pain. 20 tablet 0  . prochlorperazine (COMPAZINE) 10 MG tablet Take 1 tablet (10 mg total) by mouth every 6 (six) hours as needed (Nausea or vomiting). 30 tablet 1  . Tiotropium Bromide Monohydrate (SPIRIVA RESPIMAT) 2.5 MCG/ACT AERS Inhale 2 puffs into the lungs daily. 1 Inhaler 6   No current facility-administered medications for this visit.     PHYSICAL EXAMINATION: ECOG PERFORMANCE STATUS: 3 - Symptomatic, >50% confined to bed  Vitals:   09/24/17 1252  BP: 130/79  Pulse: (!) 101  Resp: 18  Temp: (!) 97.5 F (36.4 C)  SpO2: 95%   Filed Weights   09/24/17 1252  Weight: 112 lb (50.8 kg)    GENERAL:alert, no distress and comfortable SKIN: skin color, texture, turgor are normal, no rashes or significant lesions EYES: normal, Conjunctiva are pink and non-injected, sclera clear OROPHARYNX:no exudate, no erythema and lips, buccal mucosa, and tongue normal  LYMPH:  no palpable cervical, supraclavicular, axillary lymphadenopathy LUNGS: clear to auscultation bilaterally with decreased breath sounds throughout; normal breathing effort HEART: regular rate & rhythm and no murmurs and no lower extremity  edema ABDOMEN:abdomen soft, non-tender and normal bowel sounds Musculoskeletal:no cyanosis of digits and no clubbing  NEURO: alert & oriented x 3 with fluent speech, no focal motor/sensory deficits PAC without erythema  LABORATORY DATA:  I have reviewed the data as listed CBC Latest Ref Rng & Units 09/24/2017 09/17/2017 09/12/2017  WBC 4.0 - 10.3 K/uL 6.2 10.4(H) 8.2  Hemoglobin 13.0 - 17.1 g/dL 14.1 14.3 12.4(L)  Hematocrit 38.4 - 49.9 % 42.1 43.6 39.6  Platelets 140 - 400 K/uL 174 179 213     CMP Latest Ref Rng & Units 09/24/2017 09/17/2017 09/12/2017  Glucose 70 - 140 mg/dL 141(H) 93 138(H)  BUN 7 - 26 mg/dL 8 12 11   Creatinine 0.70 - 1.30 mg/dL 0.58(L) 0.62(L) 0.51(L)  Sodium 136 - 145 mmol/L 137 140 137  Potassium 3.5 - 5.1 mmol/L 3.8 3.9 5.1  Chloride 98 - 109 mmol/L 102 101 97(L)  CO2 22 - 29 mmol/L 29 31(H) 32  Calcium 8.4 - 10.4 mg/dL 9.1 8.9 8.7(L)  Total Protein 6.4 - 8.3 g/dL 6.6 6.4 5.6(L)  Total Bilirubin 0.2 - 1.2 mg/dL 0.3 0.6 0.6  Alkaline Phos 40 - 150 U/L 100 91 67  AST 5 - 34  U/L 17 18 16   ALT 0 - 55 U/L 30 50 17   CEA  06/19/17: 3.38 07/06/17: 2.88   PATHOLOGY  Diagnosis 05/24/17  1. Stomach, biopsy, mass distal - ADENOCARCINOMA, SEE COMMENT. - CHRONIC ACTIVE GASTRITIS WITH INTESTINAL METAPLASIA. 2. 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 - NO DYSPLASIA OR MALIGNANCY. Microscopic Comment 1. The fragments are superficial and therefore assessment of invasion is hampered. Dr. Saralyn Pilar has reviewed the case. The case was called to Dr. [name] on 05/25/2017. 2. A Warthin-Starry stain is performed to determine the possibility of the presence of Helicobacter pylori. The Warthin-Starry stain is negative for organisms of Helicobacter pylori   RADIOGRAPHIC STUDIES: I have personally reviewed the radiological images as listed and agreed with the findings in the report. No  results found.   ASSESSMENT & PLAN: Bobby Caldwell is a 62 y.o. [place] male with a history of heavy smoking and COPD, peripheral vascular disease, presents with fatigue, anorexia, early satiety and weight loss   1. Gastric Adenocarcinoma, uT2N0M0, stage I -Bobby Caldwell appears stable. He completed 4 cycles neoadjuvant FOLFOX on 2/28; cycle 5 was delayed for recovery from hospital admission for COPD exacerbation. Labs reviewed, adequate for treatment. VS and weight stable. His overall condition appears slightly improved, but he continues to have low performance status and overall poor tolerance to chemotherapy. He does not want chemo today. This was a shared visit with Dr. Burr Medico; will hold further chemo for now. Dr. [name] and Dr. Burr Medico have reviewed imaging, due to his vascular status he is not ideal surgical candidate, but she has agreed to see him; will refer him to Dr. [name] as well as to Dr. Lisbeth Renshaw to discuss possible radiation therapy.  -Dr. Burr Medico reviewed that while his abdominal pain is improved, his gastric cancer is not completely gone; if Dr. [name] confirms he is not a surgical candidate, will then move to radiation therapy, or possibly chemo-RT, then observation.  -lab and f/u in 2 weeks, referrals placed to Dr. [name] and Dr. Lisbeth Renshaw -He declined IVF support today; he will drink more at home   2. Anorexia, insomnia, nausea, weight loss -Appetite is fair, on marinol and remeron. Weight fluctuates but is stable lately.   3. COPD, smoking cessation -S/p COPD exacerbation on 08/20/17, held treatment and prescribed albuterol, steroids, and zpak but patient did not take meds as prescribed. Subsequently admitted on 09/10/17 for COPD exacerbation. Completed treatment as instructed. Respiratory status improving. Again encouraged him to quit smoking; he is down to 7 cigarettes   4. Financial support -Disability paperwork faxed to [place] and Leave per 09/18/17 documentation note  5.  Personality change, depression and/or anxiety -Stable, on remeron   6. Lower extremity/pedal edema with skin rash, right hand/wrist edema  -treated with medrol dose pack; resolved.   PLAN: -Hold chemo due to poor tolerance -Referrals to Dr. [name] and Dr. Lisbeth Renshaw placed today -lab and f/u in 3 weeks  -Continue smoking cessation  Orders Placed This Encounter  Procedures  . Ambulatory referral to General Surgery    Referral Priority:   Routine    Referral Type:   Surgical    Referral Reason:   Specialty Services Required    Requested Specialty:   General Surgery    Number of Visits Requested:   1  . Ambulatory referral to Radiation Oncology    Referral Priority:   Routine    Referral Type:   Consultation  Referral Reason:   Specialty Services Required    Requested Specialty:   Radiation Oncology    Number of Visits Requested:   1   All questions were answered. The patient knows to call the clinic with any problems, questions or concerns. No barriers to learning was detected. I spent 30 minutes counseling the patient face to face. The total time spent in the appointment was 40 minutes and more than 50% was on counseling and review of test results     Alla Feeling, NP 09/24/17   Addendum  I have seen the patient, examined him. I agree with the assessment and and plan and have edited the notes.   Bobby Caldwell has been tolerating chemo poorly, especially for the last few cycles, his COPD has been getting worse, will hold chemo for now. His case was discussed in our GI tumor conference this week, Dr. [name] feels he is a poor candidate for surgery, but will see him back and discuss surgery. Since surgery is unlikely an option, will refer him to rad/onc Dr. Lisbeth Renshaw to consider radiation. He agree with the plan. Will see him back in 3 weeks for f/u. I may offer concurrent chemo Xeloda with his radiation.   [name]  09/24/2017

## 2017-09-26 NOTE — Progress Notes (Signed)
Speaks [place]  Bobby Caldwell presents with complaints of weight loss, anorexia, poor appetite, and early satiety.  GI Location of Tumor / Histology: Stomach  04/2017: Bypass surgery on his bilateral legs.   EGD with Dr. [name] 05/24/17: Ulcerated mass like region in the distal stomach.  Upper Endoscopic Ultrasound by Dr. 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  06/22/2017 CT Scan: Gastric mass is inconspicuous but potential 1.2 cm thick mucosal mass along the posterior gastric border, calcified granulomas in both lungs along with several noncalcified nodules stable from Aug 2016.  07/05/2017 Surgery: Pulmonary Artery Catherter placement   07/07/2017 Chemotherapy: Neoadjuvant FOLFOX with leucovorin every 2 weeks for 6 cycles starting 07/07/2017, reduced dose for one cycle.  CT Abdomen/Pelvis 09/10/2017: no acute findings identified within the abdomen or pelvis.  No evidence to suggest recurrent tumor or metastatic disease within the abdomen or pelvis.    Biopsies of stomach 05/24/17    Past/Anticipated interventions by surgeon, if any:  Poor surgical candidate.  Past/Anticipated interventions by medical oncology, if any:  Dr. Burr Medico 09/24/2017 Refer him to rad/onc Dr. Lisbeth Renshaw to consider radiation. He agree with the plan. Will see him back in 3 weeks for f/u. I may offer concurrent chemo Xeloda with his radiation.    Weight changes, if any: Has gained about a pound, appetite good  Bowel/Bladder complaints, if any: No  Nausea / Vomiting, if any: a little nauseous at times  Pain issues, if any:  No pain  Any blood per rectum: No   SAFETY ISSUES:  Prior radiation? No   Pacemaker/ICD? No  Possible current pregnancy? No  Is the patient on methotrexate? No  Current Complaints/Details: Does complain of some anxiety and shortness of breath, especially at night when he is trying to sleep.  He stays in bed all  day due to weakness.

## 2017-09-28 NOTE — Progress Notes (Signed)
Called and spoke with April at [facility] to obtain appointment for patient to see Dr. [name] for surgical consult. Dr. [name] has an appointment on 10/09/17 @ 2 :15 at their [place] office. I used [employer] interpreters to call patient to see if he would be willing to drive to [facility] for this appointment. Patient agreed. April at [place] will mail the patient a letter today with appointment time, date and location.

## 2017-10-01 ENCOUNTER — Ambulatory Visit: Payer: BLUE CROSS/BLUE SHIELD

## 2017-10-01 ENCOUNTER — Other Ambulatory Visit: Payer: BLUE CROSS/BLUE SHIELD

## 2017-10-01 ENCOUNTER — Ambulatory Visit: Payer: BLUE CROSS/BLUE SHIELD | Admitting: Nurse Practitioner

## 2017-10-02 ENCOUNTER — Ambulatory Visit
Admission: RE | Admit: 2017-10-02 | Discharge: 2017-10-02 | Disposition: A | Discharge status: Home / Self Care | Payer: BLUE CROSS/BLUE SHIELD | Source: Ambulatory Visit | Attending: Radiation Oncology | Admitting: Radiation Oncology

## 2017-10-02 ENCOUNTER — Encounter: Payer: Self-pay | Admitting: Radiation Oncology

## 2017-10-02 ENCOUNTER — Other Ambulatory Visit: Payer: Self-pay

## 2017-10-02 VITALS — BP 132/95 | HR 107 | Temp 97.8°F | Resp 16 | Ht 66.0 in | Wt 118.2 lb

## 2017-10-02 DIAGNOSIS — Z79899 Other long term (current) drug therapy: Secondary | ICD-10-CM | POA: Insufficient documentation

## 2017-10-02 DIAGNOSIS — F17210 Nicotine dependence, cigarettes, uncomplicated: Secondary | ICD-10-CM | POA: Diagnosis not present

## 2017-10-02 DIAGNOSIS — J449 Chronic obstructive pulmonary disease, unspecified: Secondary | ICD-10-CM | POA: Diagnosis not present

## 2017-10-02 DIAGNOSIS — C169 Malignant neoplasm of stomach, unspecified: Secondary | ICD-10-CM | POA: Insufficient documentation

## 2017-10-02 NOTE — Progress Notes (Signed)
Radiation Oncology         [phone] ________________________________  Name: Bobby Caldwell        MRN: [id number]  Date of Service: 10/02/2017 DOB: 28-Oct-1955  CC:Joni Fears, MD     REFERRING PHYSICIAN: [name], MD   DIAGNOSIS: The encounter diagnosis was Malignant neoplasm of stomach, unspecified location [facility]).   HISTORY OF PRESENT ILLNESS: Bobby Caldwell is a 62 y.o. male seen at the request of Dr. Burr Medico for a recent diagnosis of stomach cancer. The patient has been a heavy smoker and developed symptoms of claudication.  During his work up of this was found to have thickening of the distal stomach in retrospect on imaging and had about a 14 pound weight loss. He proceeded with a right fem-fem bipass on 05/08/17, and underwent endoscopy on 05/24/17 revealing a 3-4 cm mass in the distal stomach which was biopsied and consistent with adenocarcinoma. He underwent EUS on 06/07/17 which revealed a partially circumferential tumor in the distal stomach measuring 5 cm across consistent with T2N0 disease. He was not immediately a candidate for surgery and began Neoadjuvant FOLFOX with Leucovorin and began this on 07/07/18. He completed 4  cycles, but did develop COPD exacerbation and pneumonia. He is going to see Dr. [name] next week to consider surgical resection, but comes today to discuss options that could include chemoRT, or radiotherapy alone if he is not a surgical candidate.  PREVIOUS RADIATION THERAPY: No   PAST MEDICAL HISTORY:  Past Medical History:  Diagnosis Date  . COPD (chronic obstructive pulmonary disease) ([place])   . Dyspnea   . PAD (peripheral artery disease) ([place])   . Stomach cancer ([place])   . Tobacco abuse   . Weight loss        PAST SURGICAL HISTORY: Past Surgical History:  Procedure Laterality Date  . ABDOMINAL AORTOGRAM W/LOWER EXTREMITY N/A 05/03/2017   Procedure: ABDOMINAL AORTOGRAM W/LOWER EXTREMITY;  Surgeon: Conrad Graball, MD;  Location: [place]  CV LAB;  Service: Cardiovascular;  Laterality: N/A;  . ESOPHAGOGASTRODUODENOSCOPY     05/24/17  . ESOPHAGOGASTRODUODENOSCOPY (EGD) WITH PROPOFOL N/A 05/24/2017   Procedure: ESOPHAGOGASTRODUODENOSCOPY (EGD) WITH PROPOFOL;  Surgeon: [name], MD;  Location: [facility];  Service: Endoscopy;  Laterality: N/A;  . EUS N/A 06/07/2017   Procedure: UPPER ENDOSCOPIC ULTRASOUND (EUS) RADIAL;  Surgeon: [name], MD;  Location: [facility];  Service: Endoscopy;  Laterality: N/A;  . FEMORAL-FEMORAL BYPASS GRAFT Bilateral 05/08/2017   Procedure: LEFT TO RIGHT BYPASS GRAFT FEMORAL-FEMORAL ARTERY;  Surgeon: Conrad Franklin, MD;  Location: [place];  Service: Vascular;  Laterality: Bilateral;  . IR FLUORO GUIDE PORT INSERTION RIGHT  07/05/2017  . IR US GUIDE VASC ACCESS RIGHT  07/05/2017  . NM MYOVIEW LTD  06/2017   LOW RISK. Small basal inferoseptal defect thought to be related to artifact (although cannot exclude prior infarct). Normal wall motion i in this area would suggest artifact not infarct. Otherwise no ischemia or infarction.  Marland Kitchen PERIPHERAL VASCULAR INTERVENTION Left 05/03/2017   Procedure: PERIPHERAL VASCULAR INTERVENTION;  Surgeon: Conrad Seven Points, MD;  Location: [place] [facility];  Service: Cardiovascular;  Laterality: Left;  common iliac  . TRANSTHORACIC ECHOCARDIOGRAM  06/26/2017   Normal LV size and function. EF 60-65%. No wall motion abnormalities. GR 1 DD. Mild LA dilation     FAMILY HISTORY:  Family History  Family history unknown: Yes     SOCIAL HISTORY:  reports that he has been  smoking cigarettes.  He has a 50.00 pack-year smoking history. He has never used smokeless tobacco. He reports that he does not drink alcohol or use drugs. The patient is married and lives in [place]. He's been in the [place] since 1999. He works at [employer].    ALLERGIES: Patient has no known allergies.   MEDICATIONS:  Current Outpatient Medications  Medication Sig Dispense  Refill  . Albuterol Sulfate (PROAIR RESPICLICK) 443 (90 Base) MCG/ACT AEPB Inhale 2 puffs into the lungs every 6 (six) hours as needed. 1 each 6  . atorvastatin (LIPITOR) 10 MG tablet Take 1 tablet (10 mg total) by mouth daily. 30 tablet 2  . budesonide-formoterol (SYMBICORT) 160-4.5 MCG/ACT inhaler Inhale 2 puffs into the lungs 2 (two) times daily. 1 Inhaler 6  . clopidogrel (PLAVIX) 75 MG tablet Take 1 tablet (75 mg total) by mouth daily. 30 tablet 11  . dexamethasone (DECADRON) 4 MG tablet Take 2 tablets (8 mg total) by mouth daily. Start the day after chemotherapy for 2 days. Take with food. 30 tablet 1  . dronabinol (MARINOL) 2.5 MG capsule Take 1 capsule (2.5 mg total) by mouth 2 (two) times daily before a meal. 20 capsule 0  . lidocaine-prilocaine (EMLA) cream Apply to affected area once (Patient taking differently: Apply 1 application topically once as needed (For port-a-cath.). ) 30 g 3  . methylPREDNISolone (MEDROL DOSEPAK) 4 MG TBPK tablet Take as directed 21 tablet 0  . ondansetron (ZOFRAN) 8 MG tablet Take 1 tablet (8 mg total) by mouth 2 (two) times daily as needed for refractory nausea / vomiting. Start on day 3 after chemotherapy. 30 tablet 1  . oxyCODONE-acetaminophen (ROXICET) 5-325 MG tablet Take 1 tablet by mouth every 6 (six) hours as needed for severe pain. 20 tablet 0  . prochlorperazine (COMPAZINE) 10 MG tablet Take 1 tablet (10 mg total) by mouth every 6 (six) hours as needed (Nausea or vomiting). 30 tablet 1  . Tiotropium Bromide Monohydrate (SPIRIVA RESPIMAT) 2.5 MCG/ACT AERS Inhale 2 puffs into the lungs daily. 1 Inhaler 6   No current facility-administered medications for this encounter.      REVIEW OF SYSTEMS: On review of systems, the patient reports that he is doing well overall. He reports he is only down about 7 pounds from his normal weight of 125#. Years ago though he states he had been in the 140#  or so range. He reports occasional shortness of breath when  walking and relies on oxygen at home to help. He denies any chest pain, cough, fevers, chills, night sweats, unintended weight changes. He denies any bowel or bladder disturbances, and denies abdominal pain, nausea or vomiting. He denies any new musculoskeletal or joint aches or pains. A complete review of systems is obtained and is otherwise negative.     PHYSICAL EXAM:  Wt Readings from Last 3 Encounters:  10/02/17 118 lb 3.2 oz (53.6 kg)  09/24/17 112 lb (50.8 kg)  09/17/17 111 lb (50.3 kg)   Temp Readings from Last 3 Encounters:  10/02/17 97.8 F (36.6 C) (Oral)  09/24/17 (!) 97.5 F (36.4 C) (Oral)  09/17/17 97.9 F (36.6 C) (Oral)   BP Readings from Last 3 Encounters:  10/02/17 (!) 132/95  09/24/17 130/79  09/17/17 137/88   Pulse Readings from Last 3 Encounters:  10/02/17 (!) 107  09/24/17 (!) 101  09/17/17 89   Pain Assessment Pain Score: 0-No pain/10  In general this is a well appearing [place] European male  in no acute distress. He is alert and oriented x4 and appropriate throughout the examination. HEENT reveals that the patient is normocephalic, atraumatic. EOMs are intact. PERRLA. Skin is intact without any evidence of gross lesions. Cardiovascular exam reveals a regular rate and rhythm, no clicks rubs or murmurs are auscultated. Chest is clear to auscultation bilaterally. Lymphatic assessment is performed and does not reveal any adenopathy in the cervical, supraclavicular, axillary, or inguinal chains. Abdomen has active bowel sounds in all quadrants and is intact. The abdomen is soft, non tender, non distended. Lower extremities are negative for pretibial pitting edema, deep calf tenderness, cyanosis or clubbing.   ECOG = 1  0 - Asymptomatic (Fully active, able to carry on all predisease activities without restriction)  1 - Symptomatic but completely ambulatory (Restricted in physically strenuous activity but ambulatory and able to carry out work of a light or  sedentary nature. For example, light housework, office work)  2 - Symptomatic, <50% in bed during the day (Ambulatory and capable of all self care but unable to carry out any work activities. Up and about more than 50% of waking hours)  3 - Symptomatic, >50% in bed, but not bedbound (Capable of only limited self-care, confined to bed or chair 50% or more of waking hours)  4 - Bedbound (Completely disabled. Cannot carry on any self-care. Totally confined to bed or chair)  5 - Death   Eustace Pen MM, Creech RH, Tormey DC, et al. [phone]). "Toxicity and response criteria of the [facility] Group". [place] Oncol. 5 (6): 649-55    LABORATORY DATA:  Lab Results  Component Value Date   WBC 6.2 09/24/2017   HGB 14.1 09/24/2017   HCT 42.1 09/24/2017   MCV 87.2 09/24/2017   PLT 174 09/24/2017   Lab Results  Component Value Date   NA 137 09/24/2017   K 3.8 09/24/2017   CL 102 09/24/2017   CO2 29 09/24/2017   Lab Results  Component Value Date   ALT 30 09/24/2017   AST 17 09/24/2017   ALKPHOS 100 09/24/2017   BILITOT 0.3 09/24/2017      RADIOGRAPHY: Dg Chest 1 View  Result Date: 09/10/2017 CLINICAL DATA:  Sensation of food being stuck in the chest. EXAM: CHEST 1 VIEW COMPARISON:  Chest x-ray of May 08, 2017 FINDINGS: The lungs remain hyperinflated. There is no focal infiltrate. There is no pleural effusion. The heart and pulmonary vascularity are normal. The mediastinum is normal in width. The trachea is midline. The porta catheter tip projects over the distal third of the SVC. The bony thorax exhibits no acute abnormality. IMPRESSION: Chronic bronchitic-smoking related changes, stable. No acute pneumonia nor CHF. No mediastinal abnormality is observed. Electronically Signed   By: David  Martinique M.D.   On: 09/10/2017 12:33   Dg Chest 2 View  Result Date: 09/11/2017 CLINICAL DATA:  Pneumonia, COPD, smoker EXAM: CHEST - 2 VIEW COMPARISON:  09/10/2017 FINDINGS: RIGHT  jugular Port-A-Cath with tip projecting over SVC. Normal heart size, mediastinal contours, and pulmonary vascularity. Emphysematous and bronchitic changes consistent with COPD. Bibasilar atelectasis. Lungs clear. No definite acute infiltrate, pleural effusion, or pneumothorax. Bones demineralized. IMPRESSION: COPD changes with bibasilar atelectasis. No acute infiltrate. Electronically Signed   By: Lavonia Dana M.D.   On: 09/11/2017 15:42   Ct Angio Chest Pe W/cm &/or Wo Cm  Result Date: 09/10/2017 CLINICAL DATA:  Gastric cancer, shortness of breath, fever. EXAM: CT ANGIOGRAPHY CHEST WITH CONTRAST TECHNIQUE: Multidetector CT imaging  of the chest was performed using the standard protocol during bolus administration of intravenous contrast. Multiplanar CT image reconstructions and MIPs were obtained to evaluate the vascular anatomy. CONTRAST:  11mL ISOVUE-370 IOPAMIDOL (ISOVUE-370) INJECTION 76% COMPARISON:  10/21/2016. FINDINGS: Cardiovascular: Negative for pulmonary embolus. Right IJ Port-A-Cath terminates at the SVC RA junction. Heart size normal. No pericardial effusion. Mediastinum/Nodes: No pathologically enlarged mediastinal, hilar or axillary lymph nodes. Esophagus is grossly unremarkable. Lungs/Pleura: Moderate to severe centrilobular emphysema. There may be a small amount of fluid tracking along the minor fissure. Image quality is degraded by respiratory motion. Peribronchovascular consolidation and nodularity in the lower lobes are new. Scattered subpleural lymph nodes along the fissures. 5 mm apical left upper lobe nodule, stable. Calcified granulomas. No pleural fluid. Debris seen in the left mainstem bronchus and left lower lobe bronchi. Upper Abdomen: Visualized portions of the liver, adrenal glands, kidneys, spleen, pancreas, stomach and bowel are grossly unremarkable. No upper abdominal adenopathy. Musculoskeletal: No worrisome lytic or sclerotic lesions. Review of the MIP images confirms the above  findings. IMPRESSION: 1. Negative for pulmonary embolus. 2. New peribronchovascular consolidation and nodularity in both lower lobes, most indicative of an infectious bronchiolitis/bronchopneumonia. 3.  Emphysema (ICD10-J43.9). Electronically Signed   By: Lorin Picket M.D.   On: 09/10/2017 15:15   Ct Abdomen Pelvis W Contrast  Result Date: 09/10/2017 CLINICAL DATA:  Shortness of breath.  History gastric cancer. EXAM: CT ABDOMEN AND PELVIS WITH CONTRAST TECHNIQUE: Multidetector CT imaging of the abdomen and pelvis was performed using the standard protocol following bolus administration of intravenous contrast. CONTRAST:  175mL ISOVUE-370 IOPAMIDOL (ISOVUE-370) INJECTION 76% COMPARISON:  06/22/2017. FINDINGS: Lower chest: No pleural effusion. Multiple new nodular densities are identified within both lung bases. Index nodule within the right base measures 1.4 cm, image 28/series 4. New from previous exam. Density within the posterior right lower lobe measures 1.1 cm, image 23 of series 4. Also new from previous exam. Anterior right lower lobe lung nodular density measures 0.9 cm, image 21 of series 4. New from previous exam. Hepatobiliary: No focal liver abnormality. The gallbladder appears normal. No biliary dilatation. Pancreas: Normal appearance of the pancreas. Spleen: The spleen is normal. Adrenals/Urinary Tract: The adrenal glands are unremarkable. Normal appearance of both kidneys. The urinary bladder is normal. Stomach/Bowel: Previous gastric mass is inconspicuous on today's study. The small bowel loops have a normal course and caliber. The appendix is visualized and appears normal. No pathologic dilatation of the colon. Vascular/Lymphatic: Aortic atherosclerosis. No aneurysm. No upper abdominal adenopathy identified. 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 A stent is identified in the left common iliac artery. A femoral bypass graft is identified. Reproductive: Prostate is  unremarkable. Other: No abdominal wall hernia or abnormality. No abdominopelvic ascites. Musculoskeletal: No aggressive lytic or sclerotic bone lesions. IMPRESSION: 1. No acute findings identified within the abdomen or pelvis. 2. Interval development of multiple bilateral pulmonary densities within both lower lobes. These are indeterminate, likely post infectious or inflammatory in etiology. Atypical infection not excluded. Metastatic disease is considered less favored but not entirely excluded and follow-up imaging to ensure resolution is advised. 3. No evidence to suggest recurrent tumor or metastatic disease within the abdomen or pelvis. 4.  Aortic Atherosclerosis (ICD10-I70.0). Electronically Signed   By: Kerby Moors M.D.   On: 09/10/2017 15:11   Dg Esophagus  Result Date: 09/11/2017 CLINICAL DATA:  Sensation of food sticking in the throat. Patient reports focal left upper chest wall pain when  eating. History of gastric adenocarcinoma. EXAM: ESOPHOGRAM/BARIUM SWALLOW TECHNIQUE: Single contrast examination was performed using  thin barium. FLUOROSCOPY TIME:  Fluoroscopy Time:  1.3 minutes Radiation Exposure Index (if provided by the fluoroscopic device): 2.2 mGy Number of Acquired Spot Images: 0 COMPARISON:  None. FINDINGS: Lateral pharyngeal imaging shows normal swallowing. There was no aspiration or obstructive process. Esophagus has normal distensibility and smooth mucosal contour. A 13 mm barium tablet was swallowed successfully. No specific dysmotility was seen, including diffuse esophageal spasm as a cause of left chest pain when eating. No noted reflux or hiatal hernia. History of gastric adenocarcinoma. The stomach was not over distended. IMPRESSION: Negative exam.  No stricture or dysmotility. Electronically Signed   By: Monte Fantasia M.D.   On: 09/11/2017 16:21       IMPRESSION/PLAN: 1. Stage I, cT2N0 adenocarcinoma of the distal stomach. Dr. Lisbeth Renshaw discusses the pathology findings and  reviews the nature of gastric cancer. Dr. Lisbeth Renshaw reviews the options of treatment to include surgery versus chemoRT, versus radiotherapy alone. Given his young age, the preference if he is a candidate would be to proceed with surgical resection. Dr. Lisbeth Renshaw recommends that the patient meet with Dr. [name] to discuss the details of this. If she does not feel he is candidate, then consideration of radiotherapy +/- concurrent chemotherapy.  We discussed the risks, benefits, short, and long term effects of radiotherapy, and the patient may be interested in proceeding. Dr. Lisbeth Renshaw discusses the delivery and logistics of radiotherapy and anticipates a course of 6 weeks. We will follow up with him once he's seen Dr. [name].  The above documentation reflects my direct findings during this shared patient visit. Please see the separate note by Dr. Lisbeth Renshaw on this date for the remainder of the patient's plan of care.    Carola Rhine, PAC

## 2017-10-05 ENCOUNTER — Encounter: Payer: Self-pay | Admitting: General Practice

## 2017-10-05 NOTE — Progress Notes (Signed)
[place] CSW Progress Note  [facility] referred to [facility] for transport to appt at [facility] [id number] [place], [place]) on Tuesday April 2nd at 2:15 PM.  Provider will reach out to patient to schedule ride.  Provider aware [place] interpreter may be needed to communicate w patient.  Edwyna Shell, LCSW Clinical Social Worker Phone:  [phone]

## 2017-10-05 NOTE — Progress Notes (Signed)
[place] Psychosocial Distress Screening Clinical Social Work  Clinical Social Work was referred by distress screening protocol.  The patient scored a 5 on the Psychosocial Distress Thermometer which indicates moderate distress. Clinical Social Worker Edwyna Shell to assess for distress and other psychosocial needs. Brief interaction w patient - [place] interpreter not used.  Per patient, his major concern is help w transportation to [facility] for appt on April 2nd, CSW will work on referral to [employer] [facility] for transport.  Patient requests follow up w CSWs at his next appt at [facility] on April 1 - CSWs [place] advised of his request.  Due to difficulties w language, CSW will encourage face to face interaction w patient w translator if needed to facilitate clear communication of needs/resources.    ONCBCN DISTRESS SCREENING 10/02/2017  Screening Type Initial Screening  Distress experienced in past week (1-10) 5    Clinical Social Worker follow up needed: Yes.    If yes, follow up plan:  CSWs working on Monday 4/1 asked to meet w patient during his appointment at [facility] to confirm arrangements.  Edwyna Shell, LCSW Clinical Social Worker Phone:  [phone]

## 2017-10-07 NOTE — Progress Notes (Addendum)
[place]  [phone] [phone]  Clinic Follow up Note   Patient Care Team: Minette Brine as PCP - General (General Practice) [name], MD as Consulting Physician (Pulmonary Disease) [name], MD as Consulting Physician (Hematology) [name], MD as Attending Physician (Gastroenterology) [name], MD as Consulting Physician (General Surgery) 10/08/2017  SUMMARY OF ONCOLOGIC HISTORY: Oncology History   Cancer Staging Gastric cancer [facility]) Staging form: Stomach, AJCC 8th Edition - Clinical stage from 05/24/2017: Stage I (cT2, cN0, cM0) - Signed by [name], MD on 06/19/2017       Gastric cancer ([place])   05/24/2017 Procedure    EGD by Dr. [name] 05/24/17 IMPRESSION Ulcerated mass like region in the distal stomach (4cm across), extensively biopsied. This appears neoplastic. - Abnormal mucosa throughout stomach otherwise (mild-moderate gastritis), also extensively biopsied.      05/24/2017 Initial Biopsy    Diagnosis 05/24/17  1. Stomach, biopsy, mass distal - ADENOCARCINOMA, SEE COMMENT. - CHRONIC ACTIVE GASTRITIS WITH INTESTINAL METAPLASIA. 2. 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 - NO DYSPLASIA OR MALIGNANCY      06/04/2017 Initial Diagnosis    Gastric cancer ([place])      06/07/2017 Procedure    EUS by Dr. [name] on 06/07/17  IMPRESSION:  5cm across, partially circumferential uT2N0 gastric adenocarcinoma laying in the distal stomach 3-4cm proximal to the pylorus.      06/22/2017 Imaging    CT CAP W Contrast 06/22/17  IMPRESSION: 1. The gastric mass is somewhat inconspicuous on CT, but a potential 1.2 cm thick mucosal masses seen along the posterior gastric border on image 83/2. There several small adjacent right gastric lymph nodes measuring up to 0.7 cm in short axis, but no overtly pathologic adenopathy and no findings  of hepatic or peritoneal metastatic disease at this time. 2. Calcified granulomas in both lungs along with several noncalcified nodules stable from August 2016 and likely benign given this long-term stability. 3. Other imaging findings of potential clinical significance: Aortic Atherosclerosis (ICD10-I70.0) and Emphysema (ICD10-J43.9). Airway thickening is present, suggesting bronchitis or reactive airways disease. 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 Disc bulges at L3-4 and L4-5.      07/05/2017 Surgery    PAC placement       07/07/2017 -  Chemotherapy    Neoadjuvant FOLFOX with with leucovorin every 2 weeks for 6 cycles starting 07/07/17. Will start with reduced dose for cycle 1.       09/10/2017 Imaging     CT AP W Contrast 09/10/17 IMPRESSION: 1. No acute findings identified within the abdomen or pelvis. 2. Interval development of multiple bilateral pulmonary densities within both lower lobes. These are indeterminate, likely post infectious or inflammatory in etiology. Atypical infection not excluded. Metastatic disease is considered less favored but not entirely excluded and follow-up imaging to ensure resolution is advised. 3. No evidence to suggest recurrent tumor or metastatic disease within the abdomen or pelvis. 4.  Aortic Atherosclerosis (ICD10-I70.0).     CURRENT THERAPY:neoadjuvant FOLFOX every 2 weeks s/p 4 cycles 07/07/17 - 09/06/17; discontinued due to poor tolerance  INTERVAL HISTORY: Mr. Everitt returns for follow up as scheduled. He was seen by Dr. Lisbeth Renshaw for RT consult last week. He will see Dr. [name] at [facility] office tomorrow. Continues to report dyspnea on exertion. Productive cough is unchanged. He is easily fatigued. Uses O2 at home  as needed. Home health aid continues to come to his house daily for what sounds like pt/ot strengthening. Appetite is improving. Rarely has nausea.  Regular BM. Denies fever, chills, chest pain, vomiting, leg edema, abdominal pain, or neuropathy.   REVIEW OF SYSTEMS:   Constitutional: Denies fevers, chills or abnormal weight loss (+) improving appetite (+) easily fatigued (+) weight gain  Eyes: Denies blurriness of vision Ears, nose, mouth, throat, and face: Denies mucositis or sore throat Respiratory: Denies wheezes (+) chronic productive cough (+) DOE (+) PRN O2 use Cardiovascular: Denies palpitation, chest discomfort or lower extremity swelling Gastrointestinal:  Denies vomiting, constipation, diarrhea, abdominal pain, heartburn or change in bowel habits (+) rare nausea  Skin: Denies abnormal skin rashes Lymphatics: Denies new lymphadenopathy or easy bruising Neurological:Denies numbness, tingling or new weaknesses Behavioral/Psych: Mood is stable, no new changes  All other systems were reviewed with the patient and are negative.  MEDICAL HISTORY:  Past Medical History:  Diagnosis Date  . COPD (chronic obstructive pulmonary disease) ([place])   . Dyspnea   . PAD (peripheral artery disease) ([place])   . Stomach cancer ([place])   . Tobacco abuse   . Weight loss     SURGICAL HISTORY: Past Surgical History:  Procedure Laterality Date  . ABDOMINAL AORTOGRAM W/LOWER EXTREMITY N/A 05/03/2017   Procedure: ABDOMINAL AORTOGRAM W/LOWER EXTREMITY;  Surgeon: Conrad [place], MD;  Location: [place] [facility];  Service: Cardiovascular;  Laterality: N/A;  . ESOPHAGOGASTRODUODENOSCOPY     05/24/17  . ESOPHAGOGASTRODUODENOSCOPY (EGD) WITH PROPOFOL N/A 05/24/2017   Procedure: ESOPHAGOGASTRODUODENOSCOPY (EGD) WITH PROPOFOL;  Surgeon: [name], MD;  Location: [facility];  Service: Endoscopy;  Laterality: N/A;  . EUS N/A 06/07/2017   Procedure: UPPER ENDOSCOPIC ULTRASOUND (EUS) RADIAL;  Surgeon: [name], MD;  Location: [facility];  Service: Endoscopy;  Laterality: N/A;  . FEMORAL-FEMORAL BYPASS GRAFT Bilateral 05/08/2017   Procedure:  LEFT TO RIGHT BYPASS GRAFT FEMORAL-FEMORAL ARTERY;  Surgeon: Conrad Buchanan, MD;  Location: [place];  Service: Vascular;  Laterality: Bilateral;  . IR FLUORO GUIDE PORT INSERTION RIGHT  07/05/2017  . IR US GUIDE VASC ACCESS RIGHT  07/05/2017  . NM MYOVIEW LTD  06/2017   LOW RISK. Small basal inferoseptal defect thought to be related to artifact (although cannot exclude prior infarct). Normal wall motion i in this area would suggest artifact not infarct. Otherwise no ischemia or infarction.  Marland Kitchen PERIPHERAL VASCULAR INTERVENTION Left 05/03/2017   Procedure: PERIPHERAL VASCULAR INTERVENTION;  Surgeon: Conrad Preston, MD;  Location: [place] [facility];  Service: Cardiovascular;  Laterality: Left;  common iliac  . TRANSTHORACIC ECHOCARDIOGRAM  06/26/2017   Normal LV size and function. EF 60-65%. No wall motion abnormalities. GR 1 DD. Mild LA dilation    I have reviewed the social history and family history with the patient and they are unchanged from previous note.  ALLERGIES:  has No Known Allergies.  MEDICATIONS:  Current Outpatient Medications  Medication Sig Dispense Refill  . Albuterol Sulfate (PROAIR RESPICLICK) 062 (90 Base) MCG/ACT AEPB Inhale 2 puffs into the lungs every 6 (six) hours as needed. 1 each 6  . atorvastatin (LIPITOR) 10 MG tablet Take 1 tablet (10 mg total) by mouth daily. 30 tablet 2  . budesonide-formoterol (SYMBICORT) 160-4.5 MCG/ACT inhaler Inhale 2 puffs into the lungs 2 (two) times daily. 1 Inhaler 6  . clopidogrel (PLAVIX) 75 MG tablet Take 1 tablet (75 mg total) by mouth daily. 30 tablet 11  . dexamethasone (  DECADRON) 4 MG tablet Take 2 tablets (8 mg total) by mouth daily. Start the day after chemotherapy for 2 days. Take with food. 30 tablet 1  . dronabinol (MARINOL) 2.5 MG capsule Take 1 capsule (2.5 mg total) by mouth 2 (two) times daily before a meal. 20 capsule 0  . lidocaine-prilocaine (EMLA) cream Apply to affected area once (Patient taking differently: Apply 1  application topically once as needed (For port-a-cath.). ) 30 g 3  . methylPREDNISolone (MEDROL DOSEPAK) 4 MG TBPK tablet Take as directed 21 tablet 0  . ondansetron (ZOFRAN) 8 MG tablet Take 1 tablet (8 mg total) by mouth 2 (two) times daily as needed for refractory nausea / vomiting. Start on day 3 after chemotherapy. 30 tablet 1  . oxyCODONE-acetaminophen (ROXICET) 5-325 MG tablet Take 1 tablet by mouth every 6 (six) hours as needed for severe pain. 20 tablet 0  . prochlorperazine (COMPAZINE) 10 MG tablet Take 1 tablet (10 mg total) by mouth every 6 (six) hours as needed (Nausea or vomiting). 30 tablet 1  . Tiotropium Bromide Monohydrate (SPIRIVA RESPIMAT) 2.5 MCG/ACT AERS Inhale 2 puffs into the lungs daily. 1 Inhaler 6   No current facility-administered medications for this visit.     PHYSICAL EXAMINATION: ECOG PERFORMANCE STATUS: 2  Vitals:   10/08/17 0820  BP: (!) 147/84  Pulse: 98  Resp: 20  Temp: 98 F (36.7 C)  SpO2: 90%   Filed Weights   10/08/17 0820  Weight: 119 lb 11.2 oz (54.3 kg)    GENERAL:alert, no distress and comfortable SKIN: skin color, texture, turgor are normal, no rashes or significant lesions EYES: normal, Conjunctiva are pink and non-injected, sclera clear OROPHARYNX:no exudate, no erythema and lips, buccal mucosa, and tongue normal  LYMPH:  no palpable cervical, supraclavicular, or axillary lymphadenopathy  LUNGS: clear to auscultation with normal breathing effort (+) decreased breath sounds HEART: regular rate & rhythm and no murmurs and no lower extremity edema ABDOMEN:abdomen soft, non-tender and normal bowel sounds Musculoskeletal:no cyanosis of digits and no clubbing  NEURO: alert & oriented x 3 with fluent speech, no focal motor/sensory deficits PAC without erythema   LABORATORY DATA:  I have reviewed the data as listed CBC Latest Ref Rng & Units 10/08/2017 09/24/2017 09/17/2017  WBC 4.0 - 10.3 K/uL 6.8 6.2 10.4(H)  Hemoglobin 13.0 - 17.1 g/dL  15.1 14.1 14.3  Hematocrit 38.4 - 49.9 % 45.7 42.1 43.6  Platelets 140 - 400 K/uL 225 174 179     CMP Latest Ref Rng & Units 10/08/2017 09/24/2017 09/17/2017  Glucose 70 - 140 mg/dL 89 141(H) 93  BUN 7 - 26 mg/dL 9 8 12   Creatinine 0.70 - 1.30 mg/dL 0.64(L) 0.58(L) 0.62(L)  Sodium 136 - 145 mmol/L 142 137 140  Potassium 3.5 - 5.1 mmol/L 4.0 3.8 3.9  Chloride 98 - 109 mmol/L 102 102 101  CO2 22 - 29 mmol/L 32(H) 29 31(H)  Calcium 8.4 - 10.4 mg/dL 9.2 9.1 8.9  Total Protein 6.4 - 8.3 g/dL 6.6 6.6 6.4  Total Bilirubin 0.2 - 1.2 mg/dL 0.2 0.3 0.6  Alkaline Phos 40 - 150 U/L 115 100 91  AST 5 - 34 U/L 19 17 18   ALT 0 - 55 U/L 22 30 50   PATHOLOGY  Diagnosis 05/24/17  1. Stomach, biopsy, mass distal - ADENOCARCINOMA, SEE COMMENT. - CHRONIC ACTIVE GASTRITIS WITH INTESTINAL METAPLASIA. 2. 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 - NO DYSPLASIA  OR MALIGNANCY. Microscopic Comment 1. The fragments are superficial and therefore assessment of invasion is hampered. Dr. Saralyn Pilar has reviewed the case. The case was called to Dr. [name] on 05/25/2017. 2. A Warthin-Starry stain is performed to determine the possibility of the presence of Helicobacter pylori. The Warthin-Starry stain is negative for organisms of Helicobacter pylori   RADIOGRAPHIC STUDIES: I have personally reviewed the radiological images as listed and agreed with the findings in the report. No results found.   ASSESSMENT & PLAN: Shawan Guriis a 62 y.o.[place] male with a history of heavy smoking and COPD, peripheral vascular disease, presents with fatigue, anorexia, early satiety and weight loss   1. Gastric Adenocarcinoma, uT2N0M0, stage I 2. Anorexia, insomnia, nausea, weight loss 3. COPD, smoking cessation 4. Financial support  5. Personality change, depression and/or anxiety 6. Lower extremity/pedal edema with skin rash, right  hand/wrist edema -treated with steroids; resolved.  Mr. Greenley appears stable. He continues to recover from chemotherapy. He has gained weight, VSS. CBC and CMP reviewed. He continues to be dyspneic on exertion with O2 sat dropping to 85-87% with ambulation. I recommend he bring his portable oxygen with him to appointments. Next pulm appt 12/04/17 with Dr. Michelle Nasuti. I urged him to call his office sooner for worsening respiratory status.   He will see Dr. [name] 10/09/17 for surgical consult. If she determines he is not a surgical candidate, Dr. Lisbeth Renshaw plans to offer radiation, and Dr. Burr Medico recommends concurrent chemo with Xeloda BID on M-F on days with radiation. Dr. Burr Medico reviewed that chemoRT is sub-optimal to surgery to cure his cancer. He understands. Printed material was given to the patient to review Xeloda. F/u open pending surgery consult and discussion with Dr. [name].   PLAN: -Labs reviewed, recovering from chemotherapy -Increase O2 use PRN and bring portable oxygen to ambulatory appointments -Dr. [name] consult pending, 10/09/17 -F/U open; if not surgical, will call in [facility] and f/u few days prior to beginning chemoRT  All questions were answered. The patient knows to call the clinic with any problems, questions or concerns. No barriers to learning was detected. I spent 20 minutes counseling the patient face to face. The total time spent in the appointment was 30 minutes and more than 50% was on counseling and review of test results     Alla Feeling, NP 10/08/17   Addendum  I have seen the patient, examined him. I agree with the assessment and and plan and have edited the notes.   Mr. Lippmann still has dyspnea on exertion, secondary to his COPD, encouraged him to follow-up with his pulmonologist.  He is scheduled to see Dr. [name] tomorrow, she will likely not offer  surgery due to his poor physical condition and medical comorbidities.  If no surgery, he will proceed with radiation with  concurrent chemotherapy Xeloda, potential benefit of Xeloda and side effects were discussed with patient and his wife in details, he voiced good understanding and is agreeable with the plan.  [name]  10/08/2017

## 2017-10-08 ENCOUNTER — Inpatient Hospital Stay: Payer: BLUE CROSS/BLUE SHIELD

## 2017-10-08 ENCOUNTER — Inpatient Hospital Stay: Payer: BLUE CROSS/BLUE SHIELD | Attending: Hematology | Admitting: Nurse Practitioner

## 2017-10-08 ENCOUNTER — Encounter: Payer: Self-pay | Admitting: Nurse Practitioner

## 2017-10-08 VITALS — BP 147/84 | HR 98 | Temp 98.0°F | Resp 20 | Ht 66.0 in | Wt 119.7 lb

## 2017-10-08 DIAGNOSIS — R5383 Other fatigue: Secondary | ICD-10-CM | POA: Diagnosis not present

## 2017-10-08 DIAGNOSIS — R0609 Other forms of dyspnea: Secondary | ICD-10-CM | POA: Insufficient documentation

## 2017-10-08 DIAGNOSIS — C160 Malignant neoplasm of cardia: Secondary | ICD-10-CM

## 2017-10-08 DIAGNOSIS — I739 Peripheral vascular disease, unspecified: Secondary | ICD-10-CM | POA: Diagnosis not present

## 2017-10-08 DIAGNOSIS — J449 Chronic obstructive pulmonary disease, unspecified: Secondary | ICD-10-CM | POA: Insufficient documentation

## 2017-10-08 DIAGNOSIS — C169 Malignant neoplasm of stomach, unspecified: Secondary | ICD-10-CM | POA: Diagnosis present

## 2017-10-08 DIAGNOSIS — Z9981 Dependence on supplemental oxygen: Secondary | ICD-10-CM | POA: Insufficient documentation

## 2017-10-08 DIAGNOSIS — Z87891 Personal history of nicotine dependence: Secondary | ICD-10-CM | POA: Insufficient documentation

## 2017-10-08 DIAGNOSIS — C162 Malignant neoplasm of body of stomach: Secondary | ICD-10-CM

## 2017-10-08 LAB — CBC WITH DIFFERENTIAL/PLATELET
BASOS ABS: 0.1 10*3/uL (ref 0.0–0.1)
BASOS PCT: 1 %
Eosinophils Absolute: 0.2 10*3/uL (ref 0.0–0.5)
Eosinophils Relative: 3 %
HEMATOCRIT: 45.7 % (ref 38.4–49.9)
HEMOGLOBIN: 15.1 g/dL (ref 13.0–17.1)
Lymphocytes Relative: 28 %
Lymphs Abs: 1.9 10*3/uL (ref 0.9–3.3)
MCH: 29 pg (ref 27.2–33.4)
MCHC: 33.1 g/dL (ref 32.0–36.0)
MCV: 87.5 fL (ref 79.3–98.0)
MONOS PCT: 10 %
Monocytes Absolute: 0.7 10*3/uL (ref 0.1–0.9)
NEUTROS ABS: 4 10*3/uL (ref 1.5–6.5)
NEUTROS PCT: 58 %
Platelets: 225 10*3/uL (ref 140–400)
RBC: 5.22 MIL/uL (ref 4.20–5.82)
RDW: 17.8 % — ABNORMAL HIGH (ref 11.0–14.6)
WBC: 6.8 10*3/uL (ref 4.0–10.3)

## 2017-10-08 LAB — COMPREHENSIVE METABOLIC PANEL
ALBUMIN: 3.5 g/dL (ref 3.5–5.0)
ALK PHOS: 115 U/L (ref 40–150)
ALT: 22 U/L (ref 0–55)
AST: 19 U/L (ref 5–34)
Anion gap: 8 (ref 3–11)
BILIRUBIN TOTAL: 0.2 mg/dL (ref 0.2–1.2)
BUN: 9 mg/dL (ref 7–26)
CALCIUM: 9.2 mg/dL (ref 8.4–10.4)
CO2: 32 mmol/L — ABNORMAL HIGH (ref 22–29)
Chloride: 102 mmol/L (ref 98–109)
Creatinine, Ser: 0.64 mg/dL — ABNORMAL LOW (ref 0.70–1.30)
GFR calc Af Amer: >60 mL/min (ref >60)
GFR calc non Af Amer: >60 mL/min (ref >60)
GLUCOSE: 89 mg/dL (ref 70–140)
Potassium: 4 mmol/L (ref 3.5–5.1)
Sodium: 142 mmol/L (ref 136–145)
TOTAL PROTEIN: 6.6 g/dL (ref 6.4–8.3)

## 2017-10-08 NOTE — Patient Instructions (Signed)
Capecitabine tablets What is this medicine? CAPECITABINE (ka pe SITE a been) is a chemotherapy drug. It slows the growth of cancer cells. This medicine is used to treat breast cancer, and also colon or rectal cancer. This medicine may be used for other purposes; ask your health care provider or pharmacist if you have questions. COMMON BRAND NAME(S): Xeloda What should I tell my health care provider before I take this medicine? They need to know if you have any of these conditions: -bleeding or blood disorders -dihydropyrimidine dehydrogenase (DPD) deficiency -heart disease -infection (especially a virus infection such as chickenpox, cold sores, or herpes) -kidney disease -liver disease -an unusual or allergic reaction to capecitabine, 5-fluorouracil, other medicines, foods, dyes, or preservatives -pregnant or trying to get pregnant -breast-feeding How should I use this medicine? Take this medicine by mouth with a glass of water, within 30 minutes of the end of a meal. Do not cut, crush or chew this medicine. Follow the directions on the prescription label. Take your medicine at regular intervals. Do not take it more often than directed. Do not stop taking except on your doctor's advice. Your doctor may want you to take a combination of 150 mg and 500 mg tablets for each dose. It is very important that you know how to correctly take your dose. Taking the wrong tablets could result in an overdose (too much medication) or underdose (too little medication). Talk to your pediatrician regarding the use of this medicine in children. Special care may be needed. Overdosage: If you think you have taken too much of this medicine contact a poison control center or emergency room at once. NOTE: This medicine is only for you. Do not share this medicine with others. What if I miss a dose? If you miss a dose, do not take the missed dose at all. Do not take double or extra doses. Instead, continue with your  next scheduled dose and check with your doctor. What may interact with this medicine? -antacids with aluminum and/or magnesium -folic acid -leucovorin -medicines to increase blood counts like filgrastim, pegfilgrastim, sargramostim -phenytoin -vaccines -warfarin Talk to your doctor or health care professional before taking any of these medicines: -acetaminophen -aspirin -ibuprofen -ketoprofen -naproxen This list may not describe all possible interactions. Give your health care provider a list of all the medicines, herbs, non-prescription drugs, or dietary supplements you use. Also tell them if you smoke, drink alcohol, or use illegal drugs. Some items may interact with your medicine. What should I watch for while using this medicine? Visit your doctor for checks on your progress. This drug may make you feel generally unwell. This is not uncommon, as chemotherapy can affect healthy cells as well as cancer cells. Report any side effects. Continue your course of treatment even though you feel ill unless your doctor tells you to stop. In some cases, you may be given additional medicines to help with side effects. Follow all directions for their use. Call your doctor or health care professional for advice if you get a fever, chills or sore throat, or other symptoms of a cold or flu. Do not treat yourself. This drug decreases your body's ability to fight infections. Try to avoid being around people who are sick. This medicine may increase your risk to bruise or bleed. Call your doctor or health care professional if you notice any unusual bleeding. Be careful brushing and flossing your teeth or using a toothpick because you may get an infection or bleed more easily. If  may increase your risk to bruise or bleed. Call your doctor or health care professional if you notice any unusual bleeding.  Be careful brushing and flossing your teeth or using a toothpick because you may get an infection or bleed more easily. If you have any dental work done, tell your dentist you are receiving this medicine.  Avoid taking products that contain aspirin, acetaminophen, ibuprofen,  naproxen, or ketoprofen unless instructed by your doctor. These medicines may hide a fever.  Do not become pregnant while taking this medicine or for 6 months after stopping it. Women should inform their doctor if they wish to become pregnant or think they might be pregnant. There is a potential for serious side effects to an unborn child. Talk to your health care professional or pharmacist for more information. Do not breast-feed an infant while taking this medicine or for 2 weeks after stopping it.  Men are advised not to father a child while taking this medicine or for 3 months after stopping it.  This medicine may make it more difficult to get pregnant or father a child. Talk with your doctor or health care professional if you are concerned about your fertility.  What side effects may I notice from receiving this medicine?  Side effects that you should report to your doctor or health care professional as soon as possible:  -allergic reactions like skin rash, itching or hives, swelling of the face, lips, or tongue  -low blood counts - this medicine may decrease the number of white blood cells, red blood cells and platelets. You may be at increased risk for infections and bleeding.  -signs of infection - fever or chills, cough, sore throat, pain or difficulty passing urine  -signs of decreased platelets or bleeding - bruising, pinpoint red spots on the skin, black, tarry stools, blood in the urine  -signs of decreased red blood cells - unusually weak or tired, fainting spells, lightheadedness  -breathing problems  -changes in vision  -chest pain  -dark urine  -diarrhea of more than 4 bowel movements in one day or any diarrhea at night; bloody or watery diarrhea  -dizziness  -mouth sores  -nausea and vomiting  -pain, tingling, numbness in the hands or feet  -redness, swelling, or sores on hands or feet  -stomach pain  -vomiting  -yellow color of skin or eyes   Side effects that usually do not require medical attention (report to your doctor or health care professional if they continue or are bothersome):  -constipation  -diarrhea  -dry or itchy skin  -hair loss  -loss of appetite  -nausea  -weak or tired  This list may not describe all possible side effects. Call your doctor for medical advice about side effects. You may report side effects to FDA at 1-800-FDA-1088.  Where should I keep my medicine?  Keep out of the reach of children.  Store at room temperature between 15 and 30 degrees C (59 and 86 degrees F). Keep container tightly closed. Throw away any unused medicine after the expiration date.  NOTE: This sheet is a summary. It may not cover all possible information. If you have questions about this medicine, talk to your doctor, pharmacist, or health care provider.  © 2018 [employer]vier/Gold Standard (2015-07-14 13:11:21)

## 2017-10-09 ENCOUNTER — Telehealth: Payer: Self-pay | Admitting: Hematology

## 2017-10-09 NOTE — Telephone Encounter (Signed)
No los 4/1

## 2017-10-10 ENCOUNTER — Other Ambulatory Visit: Payer: Self-pay | Admitting: General Surgery

## 2017-10-11 ENCOUNTER — Telehealth: Payer: Self-pay

## 2017-10-11 NOTE — Telephone Encounter (Signed)
   Primary Cardiologist: Glenetta Hew, MD  Chart reviewed as part of pre-operative protocol coverage. Given past medical history and time since last visit, based on ACC/AHA guidelines, Bobby Caldwell would be at acceptable risk for the planned procedure without further cardiovascular testing.   He was seen by Dr. Ellyn Hack January 2019.  Preoperative evaluation was performed at that time.  He is at least moderate risk for the procedure because of his COPD, but no further cardiac evaluation was indicated.   I spoke with the patient by phone.  He is on oxygen day and he cannot walk very far without getting shortness of breath.  However, this has not changed recently.  I will route this to Dr. Ellyn Hack so that he can address holding the Plavix.  Otherwise, no further cardiac workup is indicated and he is at increased but acceptable risk for the planned procedure.  I will route this recommendation to the requesting party via Epic fax function and remove from pre-op pool.  Please call with questions.  Rosaria Ferries, PA-C 10/11/2017, 5:28 PM

## 2017-10-11 NOTE — Telephone Encounter (Signed)
    [facility] Pre-operative Risk Assessment    Request for surgical clearance:  1. What type of surgery is being performed? Adenocarcinoma of plyoria antrum  2. When is this surgery scheduled? TBD   3. What type of clearance is required (medical clearance vs. Pharmacy clearance to hold med vs. Both ? Both  4. Are there any medications that need to be held prior to surgery and how long? Plavix  5. Practice name and name of physician performing surgery? [facility]   Dr.Faera Byerly  6. What is your office phone and fax number? Phone # [phone]   Fax [phone]  7. Anesthesia type (None, local, MAC, general) ? General   Bobby Caldwell 10/11/2017, 1:20 PM  _________________________________________________________________   (provider comments below)

## 2017-10-12 NOTE — Telephone Encounter (Signed)
He is on Plavix for his PAD -- not Cardiac.   Should be OK to hold, but need to route to Dr. Bridgett Larsson for his approval.  Glenetta Hew, MD

## 2017-10-12 NOTE — Telephone Encounter (Signed)
  Will route to Dr. Bridgett Larsson to address holding the Plavix.  Rosaria Ferries, PA-C 10/12/2017 5:39 PM [facility] [phone]

## 2017-10-22 ENCOUNTER — Ambulatory Visit (INDEPENDENT_AMBULATORY_CARE_PROVIDER_SITE_OTHER): Payer: BLUE CROSS/BLUE SHIELD | Admitting: Pulmonary Disease

## 2017-10-22 ENCOUNTER — Encounter: Payer: Self-pay | Admitting: Pulmonary Disease

## 2017-10-22 VITALS — BP 124/62 | HR 103 | Temp 98.1°F | Ht 66.0 in | Wt 121.2 lb

## 2017-10-22 DIAGNOSIS — J432 Centrilobular emphysema: Secondary | ICD-10-CM | POA: Diagnosis not present

## 2017-10-22 DIAGNOSIS — E440 Moderate protein-calorie malnutrition: Secondary | ICD-10-CM

## 2017-10-22 DIAGNOSIS — C160 Malignant neoplasm of cardia: Secondary | ICD-10-CM

## 2017-10-22 DIAGNOSIS — I70213 Atherosclerosis of native arteries of extremities with intermittent claudication, bilateral legs: Secondary | ICD-10-CM | POA: Diagnosis not present

## 2017-10-22 DIAGNOSIS — R0902 Hypoxemia: Secondary | ICD-10-CM

## 2017-10-22 DIAGNOSIS — R0789 Other chest pain: Secondary | ICD-10-CM | POA: Diagnosis not present

## 2017-10-22 DIAGNOSIS — R636 Underweight: Secondary | ICD-10-CM | POA: Diagnosis not present

## 2017-10-22 MED ORDER — LEVALBUTEROL HCL 0.63 MG/3ML IN NEBU
0.6300 mg | INHALATION_SOLUTION | Freq: Once | RESPIRATORY_TRACT | Status: AC
  Administered 2017-10-22: 0.63 mg via RESPIRATORY_TRACT

## 2017-10-22 MED ORDER — PREDNISONE 20 MG PO TABS: ORAL_TABLET | ORAL | 0 refills | Status: DC

## 2017-10-22 MED ORDER — IPRATROPIUM-ALBUTEROL 0.5-2.5 (3) MG/3ML IN SOLN: 3.0000 mL | Freq: Four times a day (QID) | RESPIRATORY_TRACT | 2 refills | Status: DC

## 2017-10-22 NOTE — Patient Instructions (Addendum)
Today we updated your med list in our EPIC system...    Continue your current medications the same...  Today we rechecked your Spirometry breathing test...  Due to your emphysema & continued smoking- abdominal surgery will be high risk for you and may require a prolonged time on the ventilator during the post op period..    Your breathing test shows further deterioration due to your smoking & in spite of the inhaler medication...  Let's try a HOME NEBULIZER w/ DUONEB medication taken 4 times daily (breakfast, lunch, dinner, bedtime)...  Continue the SYMBICORT160- 2 sprays twice daily (after the breakfast & dinner NEB treatment)...  Conjtinue the [place] - inhale contents of one capsule via handihaler one daily after the lunch NEB treatment...  We are also going to place you on a slow tapering course of PREDNISONE to reduce the bronchial tube inflammation...    Start with one tab twice daily for 2 weeks...    Then decrease to one tab each AM til return visit w/ me in 3-4 weeks time...  Finally you must quit all the smoking!!!

## 2017-10-23 ENCOUNTER — Encounter: Payer: Self-pay | Admitting: Pulmonary Disease

## 2017-10-23 NOTE — Progress Notes (Signed)
Subjective:     Patient ID: Bobby Caldwell, male   DOB: 03/28/1956, 62 y.o.   MRN: [id number]  HPI  ~  February 16, 2015:  Initial pulmonary consult by SN>   67 y/o gentleman from [place], referred by Minette Brine NP at [facility] (Dr. Glendale Chard), for pulmonary evaluation due to emphysema>      Bobby Caldwell has been a heavy smoker- started at age 35 y/o when they would "roll your own" & smoked continually up to 2ppd for ~48yr before decr to 1ppd more recently, est ~70-80 pack-yr smoking hx... He c/o SOB/ DOE, notes mild cough, sm amt beige sput, no hemptysis, occas sharp CP & palpit, no edema...  He denies signif hx of active lung problems- w/o recurrent bronchitic infections, pneumonia, Tb or known exposure, asthma, etc;  Neither has he had any hx signif cardiac problems- w/o known CAD, CHF, etc... He denies occupational exposures to asbestos, silica, mining, etc...      Bobby Caldwell also notes weight loss- states that his regular weight is ~150# & he currently weighs 113#, 5'7" Tall, BMI=17-18; appetite is poor & is rec to add nutritional supplements Bid betw meals...      Current Meds> NONE- he works at [employer] are too $$ for him... EXAM showed Afeb, VSS, O2sat=97% on RA;  HEENT- neg, mallampati1;  Chest- decr BS bilat w/ sl end-exp wheezing;  Heart- RR w/o m/r/g;  Abd- soft, neg;  Ext- w/o c/c/e;  Neuro- no focal neuro deficits...  CXR 01/15/15 showed hyperinflation, emphysema, NAD...  Spirometry 02/16/15 showed FVC=2.66 (61%), FEV1=1.25 (36%), %1sec=47, mid-flows reduced at 19% predicted... This is c/w severe airflow obstruction and GOLD Stage 3 COPD.  Ambulatory oxygen saturation test 02/16/15 showed O2sat=98% on RA at rest w/ pulse=86/min; he ambulated 3 laps w/ lowest O2sat=98% w/ HR=111/min...  [facility]2015 [facility] & scanned into EPIC...   CT Chest w/o contrast> pending (see below) IMP/PLAN>>  Bobby Caldwell has been a life long smoker & has paid a price for  this behavior w/ severe emphysema; despite still working at [employer]/ 4 daughters to support- he continues to smoke 1ppd @$5 per pack= #150/mo & I have appealed to his parental instincts to quit smoking completely but he is not optimistic that he will be able to do so & unwilling to purchase smoking cessation aides like Chantix, Nicotine replacement, etc... In addition to smoking cessation we have prescribed SYMBICORT160- 2spBid, & SPIRIVA daily, along w/ ProairHFA rescue inhaler for prn use (coupons given taking advantage of all known discount programs)... For completeness I have rec a CT Chest (esp in light of his wt loss) and his PCP may want to consider CT Abd & Pelvis as well... He is rec to start eating better, 3 meals/day, plus nutritional supplements Bid;  Finally he needs the full benefits for avail immunization programs=> Prevnar-13, Pneumovax-23, Flu shots, etc (we will leave this to his PCP to administer)... Rec ROV recheck in 6 weeks. ADDENDUM>>  CT Chest 02/22/15 showed severe centrilobular emphysema, no adenopathy, several small pulm nodules- largest is 631min LUL & RML... rec f/u CT in 50m32mo~  March 30, 2015:  6wk ROV w/ SN>        Bobby Caldwell reports that he is improved on the Symbicort160-2spBid, Spiriva daily, and the AlbutHFA prn (hasn't needed);  He notes that he's decr the smoking to 7-10 cig/d now & we discussed the need to quit  completely;  He notes min cough, no sputum or hemoptysis, stable SOB/DOE, no CP/ palpit/ or edema...  EXAM showed Afeb, VSS, O2sat=95% on RA;  HEENT- neg, mallampati1;  Chest- decr BS bilat w/ sl end-exp wheezing;  Heart- RR w/o m/r/g;  Abd- soft, neg;  Ext- w/o c/c/e;  Neuro- no focal neuro deficits... IMP/PLAN>>  Bobby Caldwell is improved on the inhalers and we provided him w/ some samples and Rx but it is unclear if he will be able to afford them- discount cards & drug company programs have been provided... We plan ROV 3-4 months...  ~ August 02, 2015: 655moROV  w/ SN>   Bobby Caldwell returns, unaccompanied for today's visit, states his breathing is unchanged- c/o DOE w/ min activ, cough w/ yellow sput; he denies CP, hemoptysis, edema; he is still able to perform ADLs but SOB w/ walking/ stairs/ etc; he reports that he has decreased his smoking from 2.5ppd to 1ppd at present; ?if he is really using his [place] daily? He admits to running out of his AlbutHFA rescue inhaler...  Severe COPD/ Emphysema> He has evid for severe airflow obstruction, GOLD Stage3 COPD/ Emphysema, no desaturation w/ exercise, CTChest 02/2015 showed severe centrilob emphysema & several small nodules- largest 663min LUL & RML; On Symbicort160-2spBid & Spiriva Respimat-2sp once daily, and AlbutHFA rescue inhaler as needed...  Cigarette smoker> He has been smoking regularly since age [age]est 81+pack-yr smoking hx, he is proud that he has decr fro 2.5ppd to 1ppd currently; we discussed this & I stressed the need to quit completely...  Weight loss> He says his norm wt was ~150# and he has lost down to 114#; we reviewed nutrition, advised Ensure/ Sustacal/ Boost daily... EXAM showed Afeb, VSS, O2sat=95% on RA; HEENT- neg, mallampati1; Chest- decr BS bilat w/ sl end-exp wheezing; Heart- RR w/o m/r/g; Abd- soft, neg; Ext- w/o c/c/e; Neuro- no focal neuro deficits... IMP/PLAN>> Pt has severe COPD/emphysema & still smoking 1ppd- must quit all smoking & take his Symbicort/ Spiriva regularly & AlbutHFA as needed; He can use MUCINEX600- 2Bid w/ fluids for thick phlegm. We plan ROV 55m555mowill plan f/u CTChest at that time...  ~  January 10, 2016:  55mo51mo w/ SN>     Bobby Caldwell is here today w/ his friend & neighbor "like my nephew" named [name] phone #[phone] is still smoking 1ppd and can't vs won't quit, not motivated despite my harsh warnings and the fact that he has 3 daughters;  He remains SOB w/ min activity &  ADLs and estimates that it's been ~76yrs54yrce he could do ADLs w/o the dyspnea;  Notes mild cough, sm amt thick greenish sput, no hemoptysis; notes sl chest discomfort but no severe pain or pleurisy, no f/c/s, no real COPD exac in the interval;  He also reports poor appetite & not eating well, weight is down 5# to 109# today... He informs me that [facility] will [facility]- sched to ret 8/28...  We reviewed the following medical problems during today's office visit >>   Severe COPD/ Emphysema> He has evid for severe airflow obstruction, GOLD Stage3 COPD/ Emphysema, no desaturation w/ exercise, CTChest 02/2015 showed severe centrilob emphysema & several small nodules- largest 55mm i22mUL & RML; On Symbicort160-2spBid & Spiriva Respimat-2sp once daily, and AlbutHFA rescue inhaler as needed;  SOB w/ ADLs at baseline, he has not had any signif resp  exac in the interval...    Bilat small pulm nodules on CT chest 02/2015 + centrilobular emphysema>  He is due for f/u CT Chest to assertain if any of the lesions has grown in the interim...  Cigarette smoker> He has been smoking regularly since age 89, est 80+pack-yr smoking hx, he is proud that he has decr fro 2.5ppd to 1ppd currently; we discussed this & I stressed the need to quit completely...  Weight loss/ underweight/ poor appetite> He says his norm wt was ~150# and he has lost down to 109#; we reviewed nutrition, advised Ensure/ Sustacal/ Boost daily... EXAM showed Afeb, VSS, O2sat=94% on RA; Wt=109#, 5'7"Tall, BM17;  HEENT- neg, mallampati1; Chest- decr BS bilat w/ sl end-exp wheezing; Heart- RR w/o m/r/g; Abd- soft, neg; Ext- w/o c/c/e; Neuro- no focal neuro deficits...  Spirometry 01/10/16>  FVC=2.93 (69%), FEV1=1.03 (31%), %1sec=35%, mid-flows reduced at 16% predicted... This is c/w severe airflow obstruction & GOLD Stage 3-4 COPD  Ambulatory Oximetry 01/10/16>  O2sat=95% on RA at rest w/ pulse=99/min;  He ambulated 3 laps w/  lowest O2sat=95% w/ pulse=99/min; no desaturations...   CTChest >> DONE 01/17/16> norm heart size, atherosclerotic changes in Ao & LAD, no adenopathy, mod centrilob emphysema w/ diffuse bronchial wall thickening, bilat calcif granuloma, 68m RML & LUL nodules + several smaller bilat nodules- NO CHANGES from 2016, no new lesions, & no consolidative airsp dis... We plan repeat CT in 158yr. IMP/PLAN>>  Bobby Caldwell understand that he must quite all smoking immediately; his FEV1 has deteriorated 18% in the last yr down to 1.03L;  His oxygenation remains stable w/o desat;  He is asked to quit all smoking, stay on the Symbicort160-2spBid, spiriva Respimat- 2sp once daily, and the ProairHFA rescue inhaler prn;  He is again asked to start nutritional supplements w/ Ensure/ Sustacal/ Boost at least bid; he understands that w/o smoking cessation that his prognosis is very poor;  He works at [employer] asking about disability- I supported his application for this & explained the diff betw disability from a work poActuarys SSI;  His f/u CT Chest is pending...   ~  March 26, 2017:  1418moV & Bobby Caldwell returns after a long hiatus looking very gaunt & weak-- he has lost more weight & now weighs 100#, ~5'7"Tall, BMI=16; and still smoking daily- he admits to 3/4 ppd but has no interest in quitting smoking w/ mult excuses why HE cannot quit... His CC today is weakness & inability to walk w/ leg pain which is worse than his SOB/DOE from his emphysema... He is [place] little english-- we used a traOptometristdy over the speaker phone to communicate w/ him & this was quite satisfactory (call interpreter line at [phone] set this up & if he is at home a 3-way conf call can be set up using his home phone #[phone].    We reviewed the following medical problems during today's office visit> he has not had any medical attention since he was here last- no PCP, no known ER visits, etc...  Severe COPD/ Emphysema>  severe airflow obstruction, GOLD Stage3-4 COPD/ Emphysema, no desaturation w/ exercise, CTChest 02/2015 showed severe centrilob emphysema & several small nodules- largest 6mm50m LUL & RML (no change serially); On Symbicort160-2spBid & Spiriva Respimat-2sp once daily, Mucinex and AlbutHFA rescue inhaler as needed;  SOB w/ ADLs at baseline x yrs, he has not had any signif resp exac in the interval, he says he is more lim by  his leg symptoms...    Bilat small pulm nodules on CT chest 02/2015 + centrilobular emphysema> small bilat lung nodules are unchanged serial w/ CT scans in 2016, 2017, 2018...  Cigarette smoker> He has been smoking regularly since age 53, est 80+pack-yr smoking hx, he is proud that he has decr from 2.5ppd to 1ppd & will try to decr to 1/2ppd; we discussed this & I stressed the need to quit completely!    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  Weight loss/ underweight/ poor appetite> He says his norm wt was ~150# and he has lost down to 100#; we reviewed nutrition, advised 3 meals + Ensure/ Sustacal/ Boost daily... EXAM showed Afeb, VSS w/ BP=150/92, O2sat=96% on RA at rest; Wt=100#, 5'7"Tall, BMI=16;  HEENT- neg, mallampati1; Chest- decr BS bilat w/ prolonged exp phase & congested cough; Heart- RR w/o m/r/g; Abd- thin, soft, sl tender to palp +abd bruits; Ext- w/o c/c/e but pulses are diminished distally + left femoral bruit; Neuro- no focal neuro deficits...  CXR 03/26/17 (independently reviewed by me in the PACS system)> he did not go to our [place] for this requested CXR...  Ambulatory Oximetry on RA 03/26/17> O2sat=96% on RA at rest w/ pulse=105/min;  He ambulated only 2 laps in  the office (185'ea) w/ lowest O2sat=96% w/ pulse=107/min;  He stopped due to leg weakness & pain...  LABS 03/26/17>  Chems- wnl w/ Cr=0.59, norm LFTs;  CBC- wnl w/ Hg=16.3;  TSH=0.59;  BNP=25;  Sed=2...  CT Chest/ Abd/ Pelvis 04/02/17> norm heart size, aortic atherosclerosis but ?no coronary calcif noted; no adenopathy & esoph is ok; diffuse bronchial wall thickening & mod centrilob+paraseptal emphysema; mult small pulm nodules scat bilaterally similar & w/o change from prev scans 2016&2017 (largest RML= 6x61m), no suspicious masses or consolidative airsp dis;  In the Abd/Pelvis small calcif granuloma in liver, otherw neg hepatobiliart tree, GB looks ok, pancreas ok, adrenals/ kidneys/ bladder- all ok; Aortic atherosclerosis w/ fusiform ectasia of infrarenal AA up to 2.6x2.3cm w/ abundant mural thrombus & complete occlusion of the right common iliac art & reconstitution of flow distally, +severe stenosis of prox left common iliac art; prostate ok & no bone lesions identified...  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) and is still smoking w/ a 70-80 pack yr hx, proud of the fact that he has cut back by a few cigarettes, refuses Chantix etc;  Asked to quit completely ASAP (says he'll try to decr to 10 cig/d) & continue resp treatments w/ Symbicort160-2spBid, Spiriva Respimat-2sp daily, Mucinex & Proair;  He is most concerned about his progressive leg symptoms=> clearly related to his severe peripheral vasc dis (ectatic distal Ao w/ mural thrombus, bilat common iliac dis, etc) and he is in need of ASAP eval by [facility] where arteriography will hopefully point to bypass surg that will help his symptoms;  In addition his weight loss & prot-cal malnutrition is alarming- thankfully his labs are essentially wnl & we reviewed nutritional supplements w/ ensure/ sustacal/ carnation instant/ etc...   NOTE:  >50% of this 575m appt was spent in counseling &  coordination of care  Appt w/ [facility]  set up for 04/27/17 at 11:00AM w/ DrChen  ADDENDUM>> ArtDopplers done 04/19/17>  c/w severe arterial disease R>L w/ abn toe-brachial indices as well => he has appt w/ [facility] on 10/19 at 11:00AM..Marland Kitchen  ~  September 04, 2017:  5 month ROV & pulmonary follow up visit>  A lot has happened to Bobby Caldwell in the last 5 months>   CT Abd/Pelvis & ArtDopplers of LEs 9-04/2017 confirmed severe atherosclerotic vasc dis w/ ectasia of infrarenal Ao w/ mural thrombus, an occluded R-iliac art & hi-grade L-common iliac stenosis, ABI's were 0.46R & 0.53L...  DrChen did Aortogram & subseq L=>R Fem-Fem bypass graft 04/2017, pt disch on Plavix & Lipitor added...  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  Oncology consult DrFeng11/2018 w/ decision for FOLFOX chemorx, started 07/06/17  Cardiology eval by DrHarding 06/2017 w/ atypical CP & known PAD; they did Echo/ Myoview (plan for CT Coronary Angio was cancelled)...  His PCP is now Minette Brine     Bobby Caldwell returns now after the 16mohiatus for pulmonary recheck> known severe COPD, hx heavy smoker & still smoking 1/2ppd (he readily admits that he will never stop smoking despite the cost to his health & his family);  His last spirometry showed FEV1=1.03L (31% predicted) and we have been treating him w/ SYMBICORT160-2spBid, Spiriva Respimat- 2sp once daily, Mucinex'600mg'$  1-2Bid w/ fluids, and ProairHFA rescue inhaler as needed;  His compliance has been poor & costs may be a factor but he is still smoking!!!  He has NOT required Oxygen to this point as his ambulatory O2 sat tests have not shown desaturations w/ his limited activity...  He had some chest discomfort w/ incr SOB recently & called 911 => EMS came to his house, gave him some oxygen & he felt better, refused t go to the ER, he thinks the O2 made him better & wants  some- I explained how this works, how he must qualify if he wants insurance to cover this (unless he wants to self pay which he cannot afford) & we discussed eval w/ EKG, 2DEcho, another Ambulatory O2 test    We reviewed the following medical problems during today's office visit>   Severe COPD/ Emphysema> severe airflow obstruction, GOLD Stage3-4 COPD/ Emphysema, no desaturation w/ exercise until today, CTChest 02/2015 showed severe centrilob emphysema & several small nodules- largest 682min LUL & RML (no change on serial scans); On Symbicort160-2spBid & Spiriva Respimat-2sp once daily, Mucinex and AlbutHFA rescue inhaler as needed;  SOB w/ ADLs at baseline x yrs, he has not had any signif resp exac in the interval, he says he was more lim by his leg symptoms...    Bilat small pulm nodules on CT chest 02/2015 + centrilobular emphysema> small bilat lung nodules are unchanged serial w/ CT scans in 2016, 2017, 2018...  Cigarette smoker> He has been smoking regularly since age [age]est 80+pack-yr smoking hx, he is proud that he has decr from 2.5ppd to 1ppd & will try to decr to 1/2ppd; we discussed this & I stressed the need to quit completely!    ASPVD> he has claudication & vasc insuffic => CT Abd w/ atherosclerotic changes=> fusiform ectasia of infrarenal AA up to 2.6x2.3cm w/ abundant mural thrombus & complete occlusion of the right common iliac art & reconstitution of flow distally, +severe stenosis of prox left common iliac art;  Pt referred to  [facility] w/ abn ABIs bilat & subseq L=>R Fem-Fem bypass graft 04/2017, pt disch on Plavix & Lipitor added...  Weight loss/ underweight/ poor appetite => ADENOCARCINOMA of the Stomach> He says his norm wt was ~150# and he has lost down to 100#; we reviewed nutrition, advised 3 meals + Ensure/ Sustacal/ Boost daily; Pt referred to GI-DrJacobs w/ EGD showing Adenocarcinoma of the stomach => referred to Oncology- DrFeng for chemorx & Surg- DrByerly... EXAM showed Afeb,  VSS w/ BP=116/80, O2sat=95% on RA at rest; Wt=118#, 5'7"Tall, BMI=18;  HEENT- neg, mallampati1; Chest- decr BS bilat w/ prolonged exp phase & congested cough; Heart- RR w/o m/r/g; Abd- thin, soft, sl tender to palp +abd bruits; Ext- w/o c/c/e but pulses are diminished distally + left femoral bruit; Neuro- no focal neuro deficits...  Ambulatory Oxygen Test>  O2sat=95% on RA at rest w/ pulse=64/min;  He ambulated just one lap in the office ([place] w/ nadir O2sat=85% w/ pulse=105/min... HE NOW QUALIFIES FOR PORTABLE O2 SYSTEM.Marland KitchenMarland Kitchen  LABS 09/03/17>  Chems- lytes- ok , Cr=0.56, ALB=2.6;  CBC- Hg=13.6, wbc=9.8.Marland KitchenMarland Kitchen  IMP/PLAN>>  I again implored Bobby Caldwell to quit all smoking but he is not moved- indicates that 10cig/d is not that bad & he will not be able to cut this down further;  He now qualifies for Home O2 & we will set him up w/ home oxygen at 1L/min Qhs and 2L/min w/ activity (he indicated that he would only use it when he felt he needed it for SOB);  Finally we reviewed the need for regular dosing of his medications- Symbicort160-2spBid, Spiriva respimat- 2sp once daily, Mucinex600-1to2Bid w/ fluids, and the ProairHFA as needed;  He certainly has a lot on him w/ stomach cancer (s/p chemorx & considering surg), PAD- s/p fem-fem bypass, other vasc dis, wt loss/ poor nutrition, etc...    ~  October 22, 2017:  42moROV & pt is sent back for Pre-op eval by DrByerly>  Unfortunately the pt continues to smoke ~6cig/d & demonstates deterioration in his Spirometry parameters (see below); he says that this is the best he can do and he will not be able to quit, not interested in trying further, declines Chantix etc; on further questioning he doesn't quite understand why he can't get a lung transplant 1st, then proceed w/ the stomach cancer surgery and I took the time to explain this to him & his wife... I reviewed the following interval medical records in Epic>      He was HOSP by [facility] 3/4 - 3/6 19 w/ COPD exac, acute on  chr hypoxemic resp failure, no organism ident & he responded to Rocephin, Zithromax, Solumedrol, NEBS, oxygen, etc;  XRays, cultures, Labs- reviewed...     He saw XRT- DrMoody on 09/26/17>  Note reviewed, surgery is considered optimal for his cancer, but XRT w/ concurrent ChemoRx is offered if surg is ruled out...     He saw ONCOLOGY- LBurton,NP on 10/08/17>  Note reviewed, they plan to offer concurrent chemoRx w/ Xeloda BID M=>F on days of his XRT w/ DrMoody only if he is determined to NOT be a surg candidate...     She saw SURG- DrByerly on 10/09/17> f/u gastic cancer w/ hx severe COPD & severe PAD (s/p L=>R Fem-Fem bypass 10/18 by DrChen); s/p neoadjuvant chemotherapy w/ DrFeng finished 09/2017;  Note reviewed- DrB wants assessment of operative risk- see below... We reviewed the following medical problems during today's office visit>    Severe COPD/ Emphysema> severe airflow obstruction, GOLD Stage3-4  COPD/ Emphysema, & exercise induced hypoxemia, CTChest 02/2015 showed severe centrilob emphysema & several small nodules- largest 75m in LUL & RML (no change on serial scans); On Symbicort160-2spBid & Spiriva Respimat-2sp once daily, Mucinex and AlbutHFA rescue inhaler as needed;  SOB w/ ADLs at baseline x yrs, prev more lim in activity by his claudication, he was ADM w/ COPD exac 09/2017 & improved w/ standard therapy...    Bilat small pulm nodules on CT chest 02/2015 + centrilobular emphysema> small bilat lung nodules are unchanged serial w/ CT scans in 2016, 2017, 2018...  Cigarette smoker> He has been smoking regularly since age 62 est 80+pack-yr smoking hx, he is proud that he has decr from 2.5ppd to 1ppd & then down to 1/2ppd; we discussed this & I stressed the need to quit completely!    ASPVD> he has claudication & vasc insuffic => CT Abd w/ atherosclerotic changes=> fusiform ectasia of infrarenal AA up to 2.6x2.3cm w/ abundant mural thrombus & complete occlusion of the right common iliac art &  reconstitution of flow distally, +severe stenosis of prox left common iliac art;  Pt referred to [facility] w/ abn ABIs bilat & subseq L=>R Fem-Fem bypass graft 04/2017, pt disch on Plavix & Lipitor added...  Weight loss/ underweight/ poor appetite => ADENOCARCINOMA of the Stomach> He says his norm wt was ~150# and he has lost down to 100#; we reviewed nutrition, advised 3 meals + Ensure/ Sustacal/ Boost daily; Pt referred to GI-DrJacobs w/ EGD showing Adenocarcinoma of the stomach => referred to Oncology- DrFeng for chemorx & Surg- DrByerly... EXAM showed Afeb, VSS w/ BP=124/62, O2sat=95% on 2L/min; Wt=121#, 5'7"Tall, BMI=18;  HEENT- neg, mallampati1; Chest- decr BS bilat w/ prolonged exp phase & congested cough; Heart- RR w/o m/r/g; Abd- thin, soft, sl tender to palp +abd bruits; Ext- w/o c/c/e but pulses are diminished distally + left femoral bruit; Neuro- no focal neuro deficits...  CXR 09/11/17>  Norm heart size, COPD, emphysema, bibasilar atelectasis, otherw clear lungs, boney demineralization, port-a-cath on right...  CT Angio Chest 09/10/17>  NEG for PE, R-IJ port-a-cath, no pathologically enlarged nodes, mod to severe centrilob emphysema, scattered sm nodules and granulomas...   Spirometry 10/22/17> (fair tracing) FVC=1.79 (44%), FEV1=0.91 (29%), %1sec=51, mid-flows reduced at 10% of the predicted norm... now indicative of GOLD Stage 4 COPD.  LABS 10/2017 in Epic>  Chems- wnl;  CBC- wnl IMP/PLAN>>  He has done reasonably well over the last 346yrw/ his severe COPD/emphysema, despite his continued smoking; He also did reasonably well w/ his major vascular procedure 04/2017; I suspect that his longevity-limiting disease process currently is his cancer diagnosis, but his COPD will surely impact his operative risk & risk of complications (ASA-4); For his part- he wants to proceed w/ the surgery (he is ECOG=1); Given his progression to GOLD Stage 4 COPD, his hypoxemic resp failure, and the fact that he  doesn't even use his inhalers unless he gets samples-- we discussed the option of switching to a Home NEB machine w/ Duoneb Qid + Symbicort160-2spBid & Spiriva once daily; plus OTC MUCUS RELIEF '400mg'$ - 2tabs tid w/ fluids; plus a course of oral PREDNISONE '20mg'$  Bid x2wks then one tab qam til return in 3-4wks before given the OK for Surg...    Referred by JaMinette BrineP at [facility] (Dr. RoGlendale Chard--- PMHx, SurgHx, [facility]tr[facility] SocHx per PCP is reviewed... Limited [place] [facility] medical history>  He is [place] speaks little english-- we used a trOptometristver  the speaker phone to communicate w/ him... (call interpretor line at [phone] to set this up & if he is at home a 3-way conf call can be set up using his home phone [phone]) Alternatively-- pt can call his daughter on cell phone & she can translate...   Past Medical History:  Diagnosis Date  . COPD (chronic obstructive pulmonary disease) ([place])   . Dyspnea   . PAD (peripheral artery disease) ([place])   . Stomach cancer ([place])   . Tobacco abuse   . Weight loss     Past Surgical History:  Procedure Laterality Date  . ABDOMINAL AORTOGRAM W/LOWER EXTREMITY N/A 05/03/2017   Procedure: ABDOMINAL AORTOGRAM W/LOWER EXTREMITY;  Surgeon: Conrad [place], MD;  Location: [facility];  Service: Cardiovascular;  Laterality: N/A;  . ESOPHAGOGASTRODUODENOSCOPY     05/24/17  . ESOPHAGOGASTRODUODENOSCOPY (EGD) WITH PROPOFOL N/A 05/24/2017   Procedure: ESOPHAGOGASTRODUODENOSCOPY (EGD) WITH PROPOFOL;  Surgeon: [name], MD;  Location: [facility];  Service: Endoscopy;  Laterality: N/A;  . EUS N/A 06/07/2017   Procedure: UPPER ENDOSCOPIC ULTRASOUND (EUS) RADIAL;  Surgeon: [name], MD;  Location: [facility];  Service: Endoscopy;  Laterality: N/A;  . FEMORAL-FEMORAL BYPASS GRAFT Bilateral 05/08/2017   Procedure: LEFT TO RIGHT BYPASS GRAFT FEMORAL-FEMORAL ARTERY;  Surgeon: Conrad Altoona, MD;   Location: [facility];  Service: Vascular;  Laterality: Bilateral;  . IR FLUORO GUIDE PORT INSERTION RIGHT  07/05/2017  . IR US GUIDE VASC ACCESS RIGHT  07/05/2017  . NM MYOVIEW LTD  06/2017   LOW RISK. Small basal inferoseptal defect thought to be related to artifact (although cannot exclude prior infarct). Normal wall motion i in this area would suggest artifact not infarct. Otherwise no ischemia or infarction.  Marland Kitchen PERIPHERAL VASCULAR INTERVENTION Left 05/03/2017   Procedure: PERIPHERAL VASCULAR INTERVENTION;  Surgeon: Conrad Pemberwick, MD;  Location: [facility];  Service: Cardiovascular;  Laterality: Left;  common iliac  . TRANSTHORACIC ECHOCARDIOGRAM  06/26/2017   Normal LV size and function. EF 60-65%. No wall motion abnormalities. GR 1 DD. Mild LA dilation    Outpatient Encounter Medications as of 10/22/2017  Medication Sig  . Albuterol Sulfate (PROAIR RESPICLICK) 841 (90 Base) MCG/ACT AEPB Inhale 2 puffs into the lungs every 6 (six) hours as needed.  Marland Kitchen atorvastatin (LIPITOR) 10 MG tablet Take 1 tablet (10 mg total) by mouth daily.  . budesonide-formoterol (SYMBICORT) 160-4.5 MCG/ACT inhaler Inhale 2 puffs into the lungs 2 (two) times daily.  . clopidogrel (PLAVIX) 75 MG tablet Take 1 tablet (75 mg total) by mouth daily.  Marland Kitchen dexamethasone (DECADRON) 4 MG tablet Take 2 tablets (8 mg total) by mouth daily. Start the day after chemotherapy for 2 days. Take with food.  . dronabinol (MARINOL) 2.5 MG capsule Take 1 capsule (2.5 mg total) by mouth 2 (two) times daily before a meal.  . lidocaine-prilocaine (EMLA) cream Apply to affected area once (Patient taking differently: Apply 1 application topically once as needed (For port-a-cath.). )  . methylPREDNISolone (MEDROL DOSEPAK) 4 MG TBPK tablet Take as directed  . ondansetron (ZOFRAN) 8 MG tablet Take 1 tablet (8 mg total) by mouth 2 (two) times daily as needed for refractory nausea / vomiting. Start on day 3 after chemotherapy.  Marland Kitchen  oxyCODONE-acetaminophen (ROXICET) 5-325 MG tablet Take 1 tablet by mouth every 6 (six) hours as needed for severe pain.  Marland Kitchen prochlorperazine (COMPAZINE) 10 MG tablet Take 1 tablet (10 mg total) by mouth every 6 (six)  hours as needed (Nausea or vomiting).  . Tiotropium Bromide Monohydrate (SPIRIVA RESPIMAT) 2.5 MCG/ACT AERS Inhale 2 puffs into the lungs daily.  Marland Kitchen ipratropium-albuterol (DUONEB) 0.5-2.5 (3) MG/3ML SOLN Take 3 mLs by nebulization 4 (four) times daily.  . predniSONE (DELTASONE) 20 MG tablet Take as directed  . [EXPIRED] levalbuterol (XOPENEX) nebulizer solution 0.63 mg    No facility-administered encounter medications on file as of 10/22/2017.     No Known Allergies   Current Medications, Allergies, Past Medical History, Past Surgical History, Family History, and Social History were reviewed in [employer] record.   Review of Systems            All symptoms NEG except where BOLDED >>  Constitutional:  F/C/S, fatigue, anorexia, unexpected weight change. HEENT:  HA, visual changes, hearing loss, earache, nasal symptoms, sore throat, mouth sores, hoarseness. Resp:  cough, sputum, hemoptysis; SOB, tightness, wheezing. Cardio:  CP, palpit, DOE, orthopnea, edema; leg weakness & pain w/ ambulation GI:  N/V/D/C, blood in stool; reflux, abd pain, distention, gas. GU:  dysuria, freq, urgency, hematuria, flank pain, voiding difficulty. MS:  joint pain, swelling, tenderness, decr ROM; neck pain, back pain, etc. Neuro:  HA, tremors, seizures, dizziness, syncope, weakness, numbness, gait abn. Skin:  suspicious lesions or skin rash. Heme:  adenopathy, bruising, bleeding. Psyche:  confusion, agitation, sleep disturbance, hallucinations, anxiety, depression suicidal.   Objective:   Physical Exam      Vital Signs:  Reviewed...   General:  WD, cachectic, 62 y/o WM in NAD; alert & oriented; pleasant & cooperative... HEENT:  [place]/AT; Conjunctiva- pink, Sclera-  nonicteric, EOM-wnl, PERRLA, EACs-clear, TMs-wnl; NOSE-clear; THROAT-clear & wnl.  Neck:  Supple w/ fair ROM; no JVD; normal carotid impulses w/o bruits; no thyromegaly or nodules palpated; no lymphadenopathy.  Chest:  Decr BS bilat, unable to augment BS voluntarily, clear without wheezes or rales; +congested cough/ rhonchi bilat Heart:  Epig PMI, Regular Rhythm; norm S1 & S2 without murmurs, rubs, or gallops detected. Abdomen:  Thin, soft & nontender- no guarding or rebound; normal bowel sounds; no organomegaly or masses palpated; left abd/ groin bruit Ext:  Normal ROM; without deformities or arthritic changes; no varicose veins, venous insuffic, or edema; LE pulses absent Neuro:  No focal neuro deficits; sensory testing normal; gait normal & balance OK. Derm:  No lesions noted; no rash etc. Lymph:  No cervical, supraclavicular, axillary, or inguinal adenopathy palpated.   Assessment:      IMP >>   Severe COPD/ Emphysema> on Symbicort160-2spBid & Spiriva Respimat-2sp once daily, Mucinex and AlbutHFA rescue inhaler as needed;  SOB w/ ADLs at baseline...    Bilat small pulm nodules on CT chest 02/2015 + centrilobular emphysema>  Serial CT scans w/o change in small bilat pulm nodules 2016-2017-2018  Cigarette smoker> He has been smoking regularly since age 83, est 80+pack-yr smoking hx, he is proud that he has decr fro 2.5ppd to 1ppd & he promises to cut to 10cig/d...    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  Weight loss/ underweight/ poor appetite> He says his norm wt was ~150# and he has lost down to 100#; we reviewed nutrition, advised Ensure/ Sustacal/ Boost daily...  PLAN >>  02/16/15>   Bobby Caldwell has been a life long smoker & has paid a price for this behavior w/ severe emphysema; despite still working at [employer] w/ 4 daughters to support- he continues to smoke 1ppd @$5 per pack= #150/mo & I have appealed to his parental instincts to quit smoking completely but he is not optimistic that he will be able to do so & unwilling to purchase smoking cessation aides like Chantix, Nicotine replacement, etc... In addition to smoking cessation we have prescribed SYMBICORT160- 2spBid, & SPIRIVA daily, along w/ ProairHFA rescue inhaler for prn use (coupons given taking advantage of all known discount programs)... For completeness I have rec a CT Chest (esp in light of his wt loss) and his PCP may want to consider CT Abd & Pelvis as well... He is rec to start eating better, 3 meals/day, plus nutritional supplements Bid;  Finally he needs the full benefits for avail immunization programs=> Prevnar-13, Pneumovax-23, Flu shots, etc (we will leave this to his PCP to administer)...  03/30/15>  Bobby Caldwell is improved on the inhalers and we provided him w/ some samples and Rx but it is unclear if he will be able to afford them- discount cards & drug company programs have been provided...  08/02/15>   Pt has severe COPD/emphysema & still smoking 1ppd- must quit all smoking & take his Symbicort/ Spiriva regularly & AlbutHFA as needed; He can use MUCINEX600- 2Bid w/ fluids for thick phlegm. We plan ROV 25mo& will plan f/u CTChest at that time 01/10/16>   He must quit all smoking; f/u CT Chest is pending; continue Symbicort/ Spiriva/ Proair... 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) and is still smoking w/ a 70-80 pack yr hx, proud of the fact that he has cut back by a few cigarettes, refuses Chantix etc;  Asked to quit completely ASAP (says he'll try to decr to 10 cig/d) & continue resp treatments w/ Symbicort160-2spBid, Spiriva Respimat-2sp daily, Mucinex & Proair;  He is most concerned about his  progressive leg symptoms=> clearly related to his severe peripheral vasc dis (ectatic distal Ao w/ mural thrombus, bilat common iliac dis, etc) and he is in need of ASAP eval by [facility] where arteriography will hopefully point to bypass surg that will help his symptoms;  In addition his weight loss & prot-cal malnutrition is alarming- thankfully his labs are essentially wnl & we reviewed nutritional supplements w/ ensure/ sustacal/ carnation instant/ etc...  09/04/17>   I again implored Bobby Caldwell to quit all smoking but he is not moved- indicates that 10cig/d is not that bad & he will not be able to cut this down further;  He now qualifies for Home O2 & we will set him up w/ home oxygen at 1L/min Qhs and 2L/min w/ activity (he indicated that he would only use it when he felt he needed it for SOB);  Finally we reviewed the need for regular dosing of his medications- Symbicort160-2spBid, Spiriva respimat- 2sp once daily, Mucinex600-1to2Bid w/ fluids, and the ProairHFA as needed;  He certainly has a lot on him w/ stomach cancer (s/p chemorx & considering surg), PAD- s/p fem-fem bypass, other vasc dis, wt loss/ poor nutrition, etc...  10/22/17>   He has done reasonably well over the last 338yrw/ his severe COPD/emphysema, despite his continued smoking; He also did reasonably well w/ his major vascular procedure 04/2017; I suspect that his longevity-limiting disease process currently is his cancer diagnosis, but his COPD will surely  impact his operative risk & risk of complications (ASA-4); For his part- he wants to proceed w/ the surgery (he is ECOG=1); Given his progression to GOLD Stage 4 COPD, his hypoxemic resp failure, and the fact that he doesn't even use his inhalers unless he gets samples-- we discussed the option of switching to a Home NEB machine w/ Duoneb Qid + Symbicort160-2spBid & Spiriva once daily; plus OTC MUCUS RELIEF '400mg'$ - 2tabs tid w/ fluids; plus a course of oral PREDNISONE '20mg'$  Bid x2wks then one tab qam  til return in 3-4wks before given the OK for Surg.     Plan:     Patient's Medications  New Prescriptions   IPRATROPIUM-ALBUTEROL (DUONEB) 0.5-2.5 (3) MG/3ML SOLN    Take 3 mLs by nebulization 4 (four) times daily.   PREDNISONE (DELTASONE) 20 MG TABLET    Take as directed  Previous Medications   ALBUTEROL SULFATE (PROAIR RESPICLICK) 536 (90 BASE) MCG/ACT AEPB    Inhale 2 puffs into the lungs every 6 (six) hours as needed.   ATORVASTATIN (LIPITOR) 10 MG TABLET    Take 1 tablet (10 mg total) by mouth daily.   BUDESONIDE-FORMOTEROL (SYMBICORT) 160-4.5 MCG/ACT INHALER    Inhale 2 puffs into the lungs 2 (two) times daily.   CLOPIDOGREL (PLAVIX) 75 MG TABLET    Take 1 tablet (75 mg total) by mouth daily.   DEXAMETHASONE (DECADRON) 4 MG TABLET    Take 2 tablets (8 mg total) by mouth daily. Start the day after chemotherapy for 2 days. Take with food.   DRONABINOL (MARINOL) 2.5 MG CAPSULE    Take 1 capsule (2.5 mg total) by mouth 2 (two) times daily before a meal.   LIDOCAINE-PRILOCAINE (EMLA) CREAM    Apply to affected area once   METHYLPREDNISOLONE (MEDROL DOSEPAK) 4 MG TBPK TABLET    Take as directed   ONDANSETRON (ZOFRAN) 8 MG TABLET    Take 1 tablet (8 mg total) by mouth 2 (two) times daily as needed for refractory nausea / vomiting. Start on day 3 after chemotherapy.   OXYCODONE-ACETAMINOPHEN (ROXICET) 5-325 MG TABLET    Take 1 tablet by mouth every 6 (six) hours as needed for severe pain.   PROCHLORPERAZINE (COMPAZINE) 10 MG TABLET    Take 1 tablet (10 mg total) by mouth every 6 (six) hours as needed (Nausea or vomiting).   TIOTROPIUM BROMIDE MONOHYDRATE (SPIRIVA RESPIMAT) 2.5 MCG/ACT AERS    Inhale 2 puffs into the lungs daily.  Modified Medications   No medications on file  Discontinued Medications   No medications on file

## 2017-10-23 NOTE — Telephone Encounter (Addendum)
   Called Dr. Lianne Moris office, he is not in today.  Spoke to his nurse regarding the Plavix.   He is in the office tomorrow, can address then.  Last PVD intervention was 05/03/2017  Rosaria Ferries, PA-C 10/23/2017 2:30 PM Beeper [phone]

## 2017-10-24 ENCOUNTER — Other Ambulatory Visit: Payer: Self-pay | Admitting: General Surgery

## 2017-10-26 NOTE — Telephone Encounter (Signed)
   Per Dr. Bridgett Larsson, okay to hold the Plavix for the procedure.   Rosaria Ferries, PA-C 10/26/2017 9:37 AM Beeper [phone]

## 2017-10-26 NOTE — Telephone Encounter (Signed)
To clarify pt is not on Plavix for CAD- Ok to hold 5 days pre op if needed.  Kerin Ransom PA-C 10/26/2017 2:51 PM

## 2017-10-29 NOTE — Telephone Encounter (Signed)
Per Pre-Op I will fax clearance over to [facility] to Dr. [name]. Ok to hold Plavix x 5 days pre op if needed.

## 2017-11-09 ENCOUNTER — Telehealth: Payer: Self-pay | Admitting: Pulmonary Disease

## 2017-11-09 NOTE — Telephone Encounter (Signed)
Spoke with Ulice Dash, he was asking for a re certification for a Education officer, museum for the patient. Looked in patient's chart and did not see where this has been done before.   Dr. Lenna Gilford, please advise if you are ok with this. Thanks!

## 2017-11-09 NOTE — Telephone Encounter (Signed)
Attempted to call Ulice Dash back.  Left a detailed message that (per SN), it is ok for re certification for a social worker for the [place] if he had any questions to call me back.

## 2017-11-09 NOTE — Telephone Encounter (Signed)
Per SN- He wanted to see Patient for follow up visit.  Called and spoke to Patient. Patient stated understanding. Appointment scheduled for 11/15/17 at 0930. Patient verbalized appointment date and time.  Nothing further needed at this time.

## 2017-11-12 NOTE — Progress Notes (Signed)
Established Previous Bypass   History of Present Illness   Bobby Caldwell is a 62 y.o. (11-Mar-1956) male who presents with chief complaint: not certain what I should do about cancer.    Prior procedures include: 1.  L-to-R fem-fem BPG (05/08/17) 2. L CIA PTA+S (05/03/17)  The patient's legs symptoms have resolved.  The patient's gastric outlet symptoms are: improved.  Pt's post-op ABI (05/22/17) were normal with normal waveforms.  The patient's treatment regimen currently included: maximal medical management.  Since his last visit, the patient has been diagnosed with gastric cancer.    The patient's PMH, PSH, SH, and FamHx were reviewed on 11/14/17 are unchanged from 05/25/17.  Current Outpatient Medications  Medication Sig Dispense Refill  . Albuterol Sulfate (PROAIR RESPICLICK) 710 (90 Base) MCG/ACT AEPB Inhale 2 puffs into the lungs every 6 (six) hours as needed. 1 each 6  . atorvastatin (LIPITOR) 10 MG tablet Take 1 tablet (10 mg total) by mouth daily. 30 tablet 2  . budesonide-formoterol (SYMBICORT) 160-4.5 MCG/ACT inhaler Inhale 2 puffs into the lungs 2 (two) times daily. 1 Inhaler 6  . clopidogrel (PLAVIX) 75 MG tablet Take 1 tablet (75 mg total) by mouth daily. 30 tablet 11  . dexamethasone (DECADRON) 4 MG tablet Take 2 tablets (8 mg total) by mouth daily. Start the day after chemotherapy for 2 days. Take with food. 30 tablet 1  . dronabinol (MARINOL) 2.5 MG capsule Take 1 capsule (2.5 mg total) by mouth 2 (two) times daily before a meal. 20 capsule 0  . ipratropium-albuterol (DUONEB) 0.5-2.5 (3) MG/3ML SOLN Take 3 mLs by nebulization 4 (four) times daily. 360 mL 2  . lidocaine-prilocaine (EMLA) cream Apply to affected area once (Patient taking differently: Apply 1 application topically once as needed (For port-a-cath.). ) 30 g 3  . methylPREDNISolone (MEDROL DOSEPAK) 4 MG TBPK tablet Take as directed 21 tablet 0  . ondansetron (ZOFRAN) 8 MG tablet Take 1 tablet (8 mg total) by  mouth 2 (two) times daily as needed for refractory nausea / vomiting. Start on day 3 after chemotherapy. 30 tablet 1  . oxyCODONE-acetaminophen (ROXICET) 5-325 MG tablet Take 1 tablet by mouth every 6 (six) hours as needed for severe pain. 20 tablet 0  . predniSONE (DELTASONE) 20 MG tablet Take as directed 50 tablet 0  . prochlorperazine (COMPAZINE) 10 MG tablet Take 1 tablet (10 mg total) by mouth every 6 (six) hours as needed (Nausea or vomiting). 30 tablet 1  . Tiotropium Bromide Monohydrate (SPIRIVA RESPIMAT) 2.5 MCG/ACT AERS Inhale 2 puffs into the lungs daily. 1 Inhaler 6   No current facility-administered medications for this visit.     On ROS today: improved dysphagia, improved early satiety    Physical Examination   Vitals:   11/14/17 0901 11/14/17 0902  BP: (!) 151/93 (!) 134/92  Pulse: (!) 104   Resp: 20   SpO2: 96%   Weight: 130 lb (59 kg)   Height: 5\' 6"  (1.676 m)    Body mass index is 20.98 kg/m.  General Alert, O x 3, WD, NAD  Pulmonary Sym exp, good B air movt, CTA B  Cardiac RRR, Nl S1, S2, no Murmurs, No rubs, No S3,S4  Vascular Vessel Right Left  Radial Palpable Palpable  Brachial Palpable Palpable  Carotid Palpable, No Bruit Palpable, No Bruit  Aorta Not palpable N/A  Femoral Palpable Palpable  Popliteal Not palpable Not palpable  PT Palpable Palpable  DP Palpable Palpable    Gastro-  intestinal soft, non-distended, non-tender to palpation, No guarding or rebound, no HSM, no masses, no CVAT B, No palpable prominent aortic pulse,    Musculo- skeletal M/S 5/5 throughout  , Extremities without ischemic changes  , No edema present, No visible varicosities , No Lipodermatosclerosis present, palpable fem-fem BPG  Neurologic Pain and light touch intact in extremities , Motor exam as listed above    Non-invasive Vascular Imaging   ABI (11/14/2017)  R:   ABI: 1.05  PT: tri  DP: bi  TBI:  0.89  L:   ABI: 1.01,   PT: tri  DP: tri  TBI:  0.99  Bypass Duplex (11/14/2017)  L CIA stent: prox: 43 c/s, in stent: 196-220 c/s, distal: 193 c/s  L-to-R fem-fem graft: 316 c/s inflow, patent graft other wise   Medical Decision Making   Bobby Caldwell is a 62 y.o. male who presents with: Gastric cancer, s/p L to R fem-fem BPG .   Patient's gastric cancer is the primary medical problem at this point.    I recommended we push surveillance studies out another 6 months to give the patient time to recover from any needed surgery or chemoXRT.    Pt would prefer to see me in 2 months.  Will repeat aortoiliac duplex at that time.  L CIA stent might need PTA for in-stent restenosis but the cancer takes precedence.  I discussed in depth with the patient the nature of atherosclerosis, and emphasized the importance of maximal medical management including strict control of blood pressure, blood glucose, and lipid levels, obtaining regular exercise, and cessation of smoking.    The patient is aware that without maximal medical management the underlying atherosclerotic disease process will progress, limiting the benefit of any interventions.  I discussed in depth with the patient the need for long term surveillance to improve the primary assisted patency of his bypass.  The patient agrees to cooperate with such.  The patient is currently on a statin: Lipitor.   The patient is currently on an anti-platelet: Plavix.  Thank you for allowing [place] to participate in this patient's care.   Adele Barthel, MD, FACS [facility] [place] Office: [phone] Pager: [phone]

## 2017-11-14 ENCOUNTER — Ambulatory Visit (INDEPENDENT_AMBULATORY_CARE_PROVIDER_SITE_OTHER)
Admit: 2017-11-14 | Discharge: 2017-11-14 | Disposition: A | Discharge status: Home / Self Care | Payer: BLUE CROSS/BLUE SHIELD | Attending: Vascular Surgery | Admitting: Vascular Surgery

## 2017-11-14 ENCOUNTER — Ambulatory Visit (HOSPITAL_COMMUNITY)
Admit: 2017-11-14 | Discharge: 2017-11-14 | Disposition: A | Discharge status: Home / Self Care | Payer: BLUE CROSS/BLUE SHIELD | Attending: Vascular Surgery | Admitting: Vascular Surgery

## 2017-11-14 ENCOUNTER — Other Ambulatory Visit: Payer: Self-pay

## 2017-11-14 ENCOUNTER — Ambulatory Visit (INDEPENDENT_AMBULATORY_CARE_PROVIDER_SITE_OTHER): Payer: Medicaid Other | Admitting: Vascular Surgery

## 2017-11-14 ENCOUNTER — Encounter: Payer: Self-pay | Admitting: Vascular Surgery

## 2017-11-14 VITALS — BP 134/92 | HR 104 | Resp 20 | Ht 66.0 in | Wt 130.0 lb

## 2017-11-14 DIAGNOSIS — I10 Essential (primary) hypertension: Secondary | ICD-10-CM | POA: Insufficient documentation

## 2017-11-14 DIAGNOSIS — I70229 Atherosclerosis of native arteries of extremities with rest pain, unspecified extremity: Secondary | ICD-10-CM

## 2017-11-14 DIAGNOSIS — Z95828 Presence of other vascular implants and grafts: Secondary | ICD-10-CM | POA: Insufficient documentation

## 2017-11-14 DIAGNOSIS — I739 Peripheral vascular disease, unspecified: Secondary | ICD-10-CM | POA: Insufficient documentation

## 2017-11-14 DIAGNOSIS — I998 Other disorder of circulatory system: Secondary | ICD-10-CM | POA: Diagnosis present

## 2017-11-15 ENCOUNTER — Ambulatory Visit (INDEPENDENT_AMBULATORY_CARE_PROVIDER_SITE_OTHER): Payer: BLUE CROSS/BLUE SHIELD | Admitting: Pulmonary Disease

## 2017-11-15 ENCOUNTER — Other Ambulatory Visit: Payer: Self-pay

## 2017-11-15 ENCOUNTER — Encounter: Payer: Self-pay | Admitting: Pulmonary Disease

## 2017-11-15 VITALS — BP 132/82 | HR 110 | Temp 98.0°F | Ht 66.0 in | Wt 131.6 lb

## 2017-11-15 DIAGNOSIS — J432 Centrilobular emphysema: Secondary | ICD-10-CM

## 2017-11-15 DIAGNOSIS — I70213 Atherosclerosis of native arteries of extremities with intermittent claudication, bilateral legs: Secondary | ICD-10-CM

## 2017-11-15 DIAGNOSIS — R0902 Hypoxemia: Secondary | ICD-10-CM

## 2017-11-15 DIAGNOSIS — I70229 Atherosclerosis of native arteries of extremities with rest pain, unspecified extremity: Secondary | ICD-10-CM

## 2017-11-15 DIAGNOSIS — F17210 Nicotine dependence, cigarettes, uncomplicated: Secondary | ICD-10-CM

## 2017-11-15 DIAGNOSIS — Z9889 Other specified postprocedural states: Secondary | ICD-10-CM

## 2017-11-15 DIAGNOSIS — R636 Underweight: Secondary | ICD-10-CM

## 2017-11-15 DIAGNOSIS — I739 Peripheral vascular disease, unspecified: Secondary | ICD-10-CM

## 2017-11-15 DIAGNOSIS — I998 Other disorder of circulatory system: Secondary | ICD-10-CM

## 2017-11-15 DIAGNOSIS — E440 Moderate protein-calorie malnutrition: Secondary | ICD-10-CM

## 2017-11-15 DIAGNOSIS — C169 Malignant neoplasm of stomach, unspecified: Secondary | ICD-10-CM

## 2017-11-15 MED ORDER — PREDNISONE 10 MG PO TABS: 10.0000 mg | ORAL_TABLET | Freq: Every day | ORAL | 5 refills | Status: DC

## 2017-11-15 NOTE — Progress Notes (Signed)
Subjective:     Patient ID: Bobby Caldwell, male   DOB: 03/28/1956, 62 y.o.   MRN: [id number]  HPI  ~  February 16, 2015:  Initial pulmonary consult by Bobby Caldwell>   67 y/o gentleman from [place], referred by Bobby Brine NP at [facility] (Dr. Glend[place] Caldwell), for pulmonary evaluation due to emphysema>      Bobby Caldwell has been a heavy smoker- started at age 35 y/o when they would "roll your own" & smoked continually up to 2ppd for ~48yr before decr to 1ppd more recently, est ~70-80 pack-yr smoking hx... He c/o SOB/ DOE, notes mild cough, sm amt beige sput, no hemptysis, occas sharp CP & palpit, no edema...  He denies signif hx of active lung problems- w/o recurrent bronchitic infections, pneumonia, Tb or known exposure, asthma, etc;  Neither has he had any hx signif cardiac problems- w/o known CAD, CHF, etc... He denies occupational exposures to asbestos, silica, mining, etc...      Bobby Caldwell also notes weight loss- states that his regular weight is ~150# & he currently weighs 113#, 5'7" Tall, BMI=17-18; appetite is poor & is rec to add nutritional supplements Bid betw meals...      Current Meds> NONE- he works at [employer] are too $$ for him... EXAM showed Afeb, VSS, O2sat=97% on RA;  HEENT- neg, mallampati1;  Chest- decr BS bilat w/ sl end-exp wheezing;  Heart- RR w/o m/r/g;  Abd- soft, neg;  Ext- w/o c/c/e;  Neuro- no focal neuro deficits...  CXR 01/15/15 showed hyperinflation, emphysema, NAD...  Spirometry 02/16/15 showed FVC=2.66 (61%), FEV1=1.25 (36%), %1sec=47, mid-flows reduced at 19% predicted... This is c/w severe airflow obstruction and GOLD Stage 3 COPD.  Ambulatory oxygen saturation test 02/16/15 showed O2sat=98% on RA at rest w/ pulse=86/min; he ambulated 3 laps w/ lowest O2sat=98% w/ HR=111/min...  [facility] 01/2015 [facility] w[facility] & scanned into EPIC...   CT Chest w/o contrast> pending (see below) IMP/PLAN>>  Bobby Caldwell has been a life long smoker & has paid a price for  this behavior w/ severe emphysema; despite still working at [employer]/ 4 daughters to support- he continues to smoke 1ppd @$5 per pack= #150/mo & I have appealed to his parental instincts to quit smoking completely but he is not optimistic that he will be able to do so & unwilling to purchase smoking cessation aides like Bobby Caldwell, Bobby Caldwell replacement, etc... In addition to smoking cessation we have prescribed [id number]- 2spBid, & Bobby Caldwell daily, along w/ Bobby Caldwell rescue Caldwell for prn use (coupons given taking advantage of all known discount programs)... For completeness I have rec a CT Chest (esp in light of his wt loss) and his PCP may want to consider CT Abd & Pelvis as well... He is rec to start eating better, 3 meals/day, plus nutritional supplements Bid;  Finally he needs the full benefits for avail immunization programs=> Bobby Caldwell, Bobby Caldwell, Bobby Caldwell shots, etc (we will leave this to his PCP to administer)... Rec ROV recheck in 6 weeks. ADDENDUM>>  CT Chest 02/22/15 showed severe centrilobular emphysema, no adenopathy, several small pulm nodules- largest is 631min LUL & RML... rec f/u CT in 50m32mo~  March 30, 2015:  6wk ROV w/ Bobby Caldwell>        Bobby Caldwell reports that he is improved on the Bobby Caldwell, Bobby Caldwell daily, and the Bobby Cal[employer] prn (hasn't needed);  He notes that he's decr the smoking to 7-10 cig/d now & we discussed the need to quit  completely;  He notes min cough, no sputum or hemoptysis, stable SOB/DOE, no CP/ palpit/ or edema...  EXAM showed Afeb, VSS, O2sat=95% on RA;  HEENT- neg, mallampati1;  Chest- decr BS bilat w/ sl end-exp wheezing;  Heart- RR w/o m/r/g;  Abd- soft, neg;  Ext- w/o c/c/e;  Neuro- no focal neuro deficits... IMP/PLAN>>  Bobby Caldwell is improved on the inhalers and we provided him w/ some samples and Rx but it is unclear if he will be able to afford them- discount cards & drug company programs have been provided... We plan ROV 3-4 months...  ~ August 02, 2015: 655moROV  w/ Bobby Caldwell>   Bobby Caldwell returns, unaccompanied for today's visit, states his breathing is unchanged- c/o DOE w/ min activ, cough w/ yellow sput; he denies CP, hemoptysis, edema; he is still able to perform ADLs but SOB w/ walking/ stairs/ etc; he reports that he has decreased his smoking from 2.5ppd to 1ppd at present; ?if he is really using his [place] daily? He admits to running out of his Bobby Caldwell...  Severe COPD/ Emphysema> He has evid for severe airflow obstruction, GOLD Stage3 COPD/ Emphysema, no desaturation w/ exercise, CTChest 02/2015 showed severe centrilob emphysema & several small nodules- largest 663min LUL & RML; On Bobby Caldwell & Bobby Caldwell once daily, and Bobby Caldwell as needed...  Cigarette smoker> He has been smoking regularly since age [age]est 80+pack-yr smoking hx, he is proud that he has decr fro 2.5ppd to 1ppd currently; we discussed this & I stressed the need to quit completely...  Weight loss> He says his norm wt was ~150# and he has lost down to 114#; we reviewed nutrition, advised Ensure/ Sustacal/ Boost daily... EXAM showed Afeb, VSS, O2sat=95% on RA; HEENT- neg, mallampati1; Chest- decr BS bilat w/ sl end-exp wheezing; Heart- RR w/o m/r/g; Abd- soft, neg; Ext- w/o c/c/e; Neuro- no focal neuro deficits... IMP/PLAN>> Pt has severe COPD/emphysema & still smoking 1ppd- must quit all smoking & take his Bobby Caldwell/ Bobby Caldwell regularly & Bobby Caldwell as needed; He can use [id number]- 2Bid w/ fluids for thick phlegm. We plan ROV 55m555mowill plan f/u CTChest at that time...  ~  January 10, 2016:  55mo51mo w/ Bobby Caldwell>     Bobby Caldwell is here today w/ his friend & neighbor "like my nephew" named Bobby Caldwell phone #[phone] is still smoking 1ppd and can't vs won't quit, not motivated despite my harsh warnings and the fact that he has 3 daughters;  He remains SOB w/ min activity &  ADLs and estimates that it's been ~76yrs54yrce he could do ADLs w/o the dyspnea;  Notes mild cough, sm amt thick greenish sput, no hemoptysis; notes sl chest discomfort but no severe pain or pleurisy, no f/c/s, no real COPD exac in the interval;  He also reports poor appetite & not eating well, weight is down 5# to 109# today... He informs me that [facility] [place] [facility]- sched to ret 8/28...  We reviewed the following medical problems during today's office visit >>   Severe COPD/ Emphysema> He has evid for severe airflow obstruction, GOLD Stage3 COPD/ Emphysema, no desaturation w/ exercise, CTChest 02/2015 showed severe centrilob emphysema & several small nodules- largest 55mm i22mUL & RML; On Bobby Caldwell & Bobby Caldwell once daily, and Bobby Caldwell as needed;  SOB w/ ADLs at baseline, he has not had any signif resp  exac in the interval...    Bilat small pulm nodules on CT chest 02/2015 + centrilobular emphysema>  He is due for f/u CT Chest to assertain if any of the lesions has grown in the interim...  Cigarette smoker> He has been smoking regularly since age 80, est 80+pack-yr smoking hx, he is proud that he has decr fro 2.5ppd to 1ppd currently; we discussed this & I stressed the need to quit completely...  Weight loss/ underweight/ poor appetite> He says his norm wt was ~150# and he has lost down to 109#; we reviewed nutrition, advised Ensure/ Sustacal/ Boost daily... EXAM showed Afeb, VSS, O2sat=94% on RA; Wt=109#, 5'7"Tall, BM17;  HEENT- neg, mallampati1; Chest- decr BS bilat w/ sl end-exp wheezing; Heart- RR w/o m/r/g; Abd- soft, neg; Ext- w/o c/c/e; Neuro- no focal neuro deficits...  Spirometry 01/10/16>  FVC=2.93 (69%), FEV1=1.03 (31%), %1sec=35%, mid-flows reduced at 16% predicted... This is c/w severe airflow obstruction & GOLD Stage 3-4 COPD  Ambulatory Oximetry 01/10/16>  O2sat=95% on RA at rest w/ pulse=99/min;  He ambulated 3 laps w/  lowest O2sat=95% w/ pulse=99/min; no desaturations...   CTChest >> DONE 01/17/16> norm heart size, atherosclerotic changes in Ao & LAD, no adenopathy, mod centrilob emphysema w/ diffuse bronchial wall thickening, bilat calcif granuloma, 77m RML & LUL nodules + several smaller bilat nodules- NO CHANGES from 2016, no new lesions, & no consolidative airsp dis... We plan repeat CT in 147yr. IMP/PLAN>>  B[facility]dwell understand that he must quite all smoking immediately; his FEV1 has deteriorated 18% in the last yr down to 1.03L;  His oxygenation remains stable w/o desat;  He is asked to quit all smoking, stay on the Bobby Caldwell, Bobby Caldwell Respimat- 2sp once daily, and the [employer] Cal[employer] rescue Caldwell prn;  He is again asked to start nutritional supplements w/ Ensure/ Sustacal/ Boost at least bid; he understands that w/o smoking cessation that his prognosis is very poor;  He works at [employer] asking about disability- I supported his application for this & explained the diff betw disability from a work poActuarys SSI;  His f/u CT Chest is pending...  ~  March 26, 2017:  1449moV & Bobby Caldwell returns after a long hiatus looking very gaunt & weak-- he has lost more weight & now weighs 100#, ~5'7"Tall, BMI=16; and still smoking daily- he admits to 3/4 ppd but has no interest in quitting smoking w/ mult excuses why HE cannot quit... His CC today is weakness & inability to walk w/ leg pain which is worse than his SOB/DOE from his emphysema... He is [place] little english-- we used a traOptometristdy over the speaker phone to communicate w/ him & this was quite satisfactory (call interpreter line at [phone] set this up & if he is at home a 3-way conf call can be set up using his home phone #[phone].    We reviewed the following medical problems during today's office visit> he has not had any medical attention since he was here last- no PCP, no known ER visits, etc...  Severe COPD/ Emphysema>  severe airflow obstruction, GOLD Stage3-4 COPD/ Emphysema, no desaturation w/ exercise, CTChest 02/2015 showed severe centrilob emphysema & several small nodules- largest 6mm83m LUL & RML (no change serially); On Bobby Caldwell & Bobby Caldwell once daily, Mucinex and Bobby Caldwell as needed;  SOB w/ ADLs at baseline x yrs, he has not had any signif resp exac in the interval, he says he is more lim by his  leg symptoms...    Bilat small pulm nodules on CT chest 02/2015 + centrilobular emphysema> small bilat lung nodules are unchanged serial w/ CT scans in 2016, 2017, 2018...  Cigarette smoker> He has been smoking regularly since age 49, est 80+pack-yr smoking hx, he is proud that he has decr from 2.5ppd to 1ppd & will try to decr to 1/2ppd; we discussed this & I stressed the need to quit completely!    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  Weight loss/ underweight/ poor appetite> He says his norm wt was ~150# and he has lost down to 100#; we reviewed nutrition, advised 3 meals + Ensure/ Sustacal/ Boost daily... EXAM showed Afeb, VSS w/ BP=150/92, O2sat=96% on RA at rest; Wt=100#, 5'7"Tall, BMI=16;  HEENT- neg, mallampati1; Chest- decr BS bilat w/ prolonged exp phase & congested cough; Heart- RR w/o m/r/g; Abd- thin, soft, sl tender to palp +abd bruits; Ext- w/o c/c/e but pulses are diminished distally + left femoral bruit; Neuro- no focal neuro deficits...  CXR 03/26/17 (independently reviewed by me in the PACS system)> he did not go to our [place] for this requested CXR...  Ambulatory Oximetry on RA 03/26/17> O2sat=96% on RA at rest w/ pulse=105/min;  He ambulated only 2 laps in  the office (185'ea) w/ lowest O2sat=96% w/ pulse=107/min;  He stopped due to leg weakness & pain...  LABS 03/26/17>  Chems- wnl w/ Cr=0.59, norm LFTs;  CBC- wnl w/ Hg=16.3;  TSH=0.59;  BNP=25;  Sed=2...  CT Chest/ Abd/ Pelvis 04/02/17> norm heart size, aortic atherosclerosis but ?no coronary calcif noted; no adenopathy & esoph is ok; diffuse bronchial wall thickening & mod centrilob+paraseptal emphysema; mult small pulm nodules scat bilaterally similar & w/o change from prev scans 2016&2017 (largest RML= 6x30m), no suspicious masses or consolidative airsp dis;  In the Abd/Pelvis small calcif granuloma in liver, otherw neg hepatobiliart tree, GB looks ok, pancreas ok, adrenals/ kidneys/ bladder- all ok; Aortic atherosclerosis w/ fusiform ectasia of infrarenal AA up to 2.6x2.3cm w/ abundant mural thrombus & complete occlusion of the right common iliac art & reconstitution of flow distally, +severe stenosis of prox left common iliac art; prostate ok & no bone lesions identified...  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) and is still smoking w/ a 70-80 pack yr hx, proud of the fact that he has cut back by a few cigarettes, refuses Bobby Caldwell etc;  Asked to quit completely ASAP (says he'll try to decr to 10 cig/d) & continue resp treatments w/ Bobby Caldwell, Bobby Caldwell daily, Mucinex & Proair;  He is most concerned about his progressive leg symptoms=> clearly related to his severe peripheral vasc dis (ectatic distal Ao w/ mural thrombus, bilat common iliac dis, etc) and he is in need of ASAP eval by [facility] where arteriography will hopefully point to bypass surg that will help his symptoms;  In addition his weight loss & prot-cal malnutrition is alarming- thankfully his labs are essentially wnl & we reviewed nutritional supplements w/ ensure/ sustacal/ carnation instant/ etc...   NOTE:  >50% of this 582m appt was spent in counseling &  coordination of care  Appt w/ [facility] set  up for 04/27/17 at 11:00AM w/ DrChen  ADDENDUM>> ArtDopplers done 04/19/17>  c/w severe arterial disease R>L w/ abn toe-brachial indices as well => he has appt w/ [facility] on 10/19 at 11:00AM..Marland Kitchen  ~  September 04, 2017:  5 month ROV & pulmonary follow up visit>  A lot has happened to Bobby Caldwell in the last 5 months>   CT Abd/Pelvis & ArtDopplers of LEs 9-04/2017 confirmed severe atherosclerotic vasc dis w/ ectasia of infrarenal Ao w/ mural thrombus, an occluded R-iliac art & hi-grade L-common iliac stenosis, ABI's were 0.46R & 0.53L...  DrChen did Aortogram & subseq L=>R Fem-Fem bypass graft 04/2017, pt disch on Plavix & Lipitor added...  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  Oncology consult DrFeng11/2018 w/ decision for FOLFOX chemorx, started 07/06/17  Cardiology eval by DrHarding 06/2017 w/ atypical CP & known PAD; they did Echo/ Myoview (plan for CT Coronary Angio was cancelled)...  His PCP is now Bobby Caldwell     Bobby Caldwell returns now after the 31mohiatus for pulmonary recheck> known severe COPD, hx heavy smoker & still smoking 1/2ppd (he readily admits that he will never stop smoking despite the cost to his health & his family);  His last spirometry showed FEV1=1.03L (31% predicted) and we have been treating him w/ Bobby Caldwell, Bobby Caldwell Respimat- 2sp once daily, [id number] w/ fluids, and Bobby Caldwell rescue Caldwell as needed;  His compliance has been poor & costs may be a factor but he is still smoking!!!  He has NOT required Oxygen to this point as his ambulatory O2 sat tests have not shown desaturations w/ his limited activity...  He had some chest discomfort w/ incr SOB recently & called 911 => EMS came to his house, gave him some oxygen & he felt better, refused t go to the ER, he thinks the O2 made him better & wants  some- I explained how this works, how he must qualify if he wants insurance to cover this (unless he wants to self pay which he cannot afford) & we discussed eval w/ EKG, 2DEcho, another Ambulatory O2 test    We reviewed the following medical problems during today's office visit>   Severe COPD/ Emphysema> severe airflow obstruction, GOLD Stage3-4 COPD/ Emphysema, no desaturation w/ exercise until today, CTChest 02/2015 showed severe centrilob emphysema & several small nodules- largest 15m9mn LUL & RML (no change on serial scans); On Bobby Caldwell & Bobby Caldwell once daily, Mucinex and Bobby Caldwell as needed;  SOB w/ ADLs at baseline x yrs, he has not had any signif resp exac in the interval, he says he was more lim by his leg symptoms...    Bilat small pulm nodules on CT chest 02/2015 + centrilobular emphysema> small bilat lung nodules are unchanged serial w/ CT scans in 2016, 2017, 2018...  Cigarette smoker> He has been smoking regularly since age 61, 53st 80+pack-yr smoking hx, he is proud that he has decr from 2.5ppd to 1ppd & will try to decr to 1/2ppd; we discussed this & I stressed the need to quit completely!    ASPVD> he has claudication & vasc insuffic => CT Abd w/ atherosclerotic changes=> fusiform ectasia of infrarenal AA up to 2.6x2.3cm w/ abundant mural thrombus & complete occlusion of the right common iliac art & reconstitution of flow distally, +severe stenosis of prox left common iliac art;  Pt referred to [facility]  w/ abn ABIs bilat & subseq L=>R Fem-Fem bypass graft 04/2017, pt disch on Plavix & Lipitor added...  Weight loss/ underweight/ poor appetite => ADENOCARCINOMA of the Stomach> He says his norm wt was ~150# and he has lost down to 100#; we reviewed nutrition, advised 3 meals + Ensure/ Sustacal/ Boost daily; Pt referred to GI-DrJacobs w/ EGD showing Adenocarcinoma of the stomach => referred to Oncology- DrFeng for chemorx & Surg- DrByerly... EXAM showed Afeb,  VSS w/ BP=116/80, O2sat=95% on RA at rest; Wt=118#, 5'7"Tall, BMI=18;  HEENT- neg, mallampati1; Chest- decr BS bilat w/ prolonged exp phase & congested cough; Heart- RR w/o m/r/g; Abd- thin, soft, sl tender to palp +abd bruits; Ext- w/o c/c/e but pulses are diminished distally + left femoral bruit; Neuro- no focal neuro deficits...  Ambulatory Oxygen Test>  O2sat=95% on RA at rest w/ pulse=64/min;  He ambulated just one lap in the office ([place] w/ nadir O2sat=85% w/ pulse=105/min... HE NOW QUALIFIES FOR PORTABLE O2 SYSTEM.Marland KitchenMarland Kitchen  LABS 09/03/17>  Chems- lytes- ok , Cr=0.56, ALB=2.6;  CBC- Hg=13.6, wbc=9.8.Marland KitchenMarland Kitchen  IMP/PLAN>>  I again implored Bobby Caldwell to quit all smoking but he is not moved- indicates that 10cig/d is not that bad & he will not be able to cut this down further;  He now qualifies for Home O2 & we will set him up w/ home oxygen at 1L/min Qhs and 2L/min w/ activity (he indicated that he would only use it when he felt he needed it for SOB);  Finally we reviewed the need for regular dosing of his medications- Bobby Caldwell, Bobby Caldwell respimat- 2sp once daily, Mucinex600-1to2Bid w/ fluids, and the Bobby Caldwell as needed;  He certainly has a lot on him w/ stomach cancer (s/p chemorx & considering surg), PAD- s/p fem-fem bypass, other vasc dis, wt loss/ poor nutrition, etc...   ~  October 22, 2017:  11moROV & pt is sent back for Pre-op eval by DrByerly>  Unfortunately the pt continues to smoke ~6cig/d & demonstates deterioration in his Spirometry parameters (see below); he says that this is the best he can do and he will not be able to quit, not interested in trying further, declines Bobby Caldwell etc; on further questioning he doesn't quite understand why he can't get a lung transplant 1st, then proceed w/ the stomach cancer surgery and I took the time to explain this to him & his wife... I reviewed the following interval medical records in Epic>      He was HOSP by [facility] 3/4 - 3/6 19 w/ COPD exac, acute on chr  hypoxemic resp failure, no organism ident & he responded to Rocephin, Zithromax, Solumedrol, NEBS, oxygen, etc;  XRays, cultures, Labs- reviewed...     He saw XRT- DrMoody on 09/26/17>  Note reviewed, surgery is considered optimal for his cancer, but XRT w/ concurrent ChemoRx is offered if surg is ruled out...     He saw ONCOLOGY- LBurton,NP on 10/08/17>  Note reviewed, they plan to offer concurrent chemoRx w/ Xeloda BID M=>F on days of his XRT w/ DrMoody only if he is determined to NOT be a surg candidate...     She saw SURG- DrByerly on 10/09/17> f/u gastic cancer w/ hx severe COPD & severe PAD (s/p L=>R Fem-Fem bypass 10/18 by DrChen); s/p neoadjuvant chemotherapy w/ DrFeng finished 09/2017;  Note reviewed- DrB wants assessment of operative risk- see below... We reviewed the following medical problems during today's office visit>    Severe COPD/ Emphysema> severe airflow obstruction, GOLD Stage3-4 COPD/ Emphysema, &  exercise induced hypoxemia, CTChest 02/2015 showed severe centrilob emphysema & several small nodules- largest 73m in LUL & RML (no change on serial scans); On Bobby Caldwell & Bobby Caldwell once daily, Mucinex and Bobby Caldwell as needed;  SOB w/ ADLs at baseline x yrs, prev more lim in activity by his claudication, he was ADM w/ COPD exac 09/2017 & improved w/ standard therapy...    Bilat small pulm nodules on CT chest 02/2015 + centrilobular emphysema> small bilat lung nodules are unchanged serial w/ CT scans in 2016, 2017, 2018...  Cigarette smoker> He has been smoking regularly since age 62 est 80+pack-yr smoking hx, he is proud that he has decr from 2.5ppd to 1ppd & then down to 1/2ppd; we discussed this & I stressed the need to quit completely!    ASPVD> he has claudication & vasc insuffic => CT Abd w/ atherosclerotic changes=> fusiform ectasia of infrarenal AA up to 2.6x2.3cm w/ abundant mural thrombus & complete occlusion of the right common iliac art &  reconstitution of flow distally, +severe stenosis of prox left common iliac art;  Pt referred to [facility] w/ abn ABIs bilat & subseq L=>R Fem-Fem bypass graft 04/2017, pt disch on Plavix & Lipitor added...  Weight loss/ underweight/ poor appetite => ADENOCARCINOMA of the Stomach> He says his norm wt was ~150# and he has lost down to 100#; we reviewed nutrition, advised 3 meals + Ensure/ Sustacal/ Boost daily; Pt referred to GI-DrJacobs w/ EGD showing Adenocarcinoma of the stomach => referred to Oncology- DrFeng for chemorx & Surg- DrByerly... EXAM showed Afeb, VSS w/ BP=124/62, O2sat=95% on 2L/min; Wt=121#, 5'7"Tall, BMI=18;  HEENT- neg, mallampati1; Chest- decr BS bilat w/ prolonged exp phase & congested cough; Heart- RR w/o m/r/g; Abd- thin, soft, sl tender to palp +abd bruits; Ext- w/o c/c/e but pulses are diminished distally + left femoral bruit; Neuro- no focal neuro deficits...  CXR 09/11/17>  Norm heart size, COPD, emphysema, bibasilar atelectasis, otherw clear lungs, boney demineralization, port-a-cath on right...  CT Angio Chest 09/10/17>  NEG for PE, R-IJ port-a-cath, no pathologically enlarged nodes, mod to severe centrilob emphysema, scattered sm nodules and granulomas...   Spirometry 10/22/17> (fair tracing) FVC=1.79 (44%), FEV1=0.91 (29%), %1sec=51, mid-flows reduced at 10% of the predicted norm... now indicative of GOLD Stage 4 COPD.  LABS 10/2017 in Epic>  Chems- wnl;  CBC- wnl IMP/PLAN>>  He has done reasonably well over the last 319yrw/ his severe COPD/emphysema, despite his continued smoking; He also did reasonably well w/ his major vascular procedure 04/2017; I suspect that his longevity-limiting disease process currently is his cancer diagnosis, but his COPD will surely impact his operative risk & risk of complications (ASA-4); For his part- he wants to proceed w/ the surgery (he is ECOG=1); Given his progression to GOLD Stage 4 COPD, his hypoxemic resp failure, and the fact that he  doesn't even use his inhalers unless he gets samples-- we discussed the option of switching to a Home NEB machine w/ Duoneb Qid + Bobby Caldwell & Bobby Caldwell once daily; plus OTC MUCUS RELIEF '400mg'$ - 2tabs tid w/ fluids; plus a course of oral PREDNISONE '20mg'$  Bid x2wks then one tab qam til return in 3-4wks before given the OK for Surg...    ~  Nov 15, 2017:  3wk ROV & pulmonary follow up visit>  DaLaneta Simmerss much improved since we started a home NEBULIZER w/ DUONEB Qid and PREDNISONE ('20mg'$  Bid x1wk & '20mg'$  Qam after that);  He has also continued the Bobby Caldwell &  Bobby Caldwell once daily via samples from our office;  He has Home O2 at 2L/min [place] et he continues to smoke 6-10 cig/d!!!  Today's exam is much improved w/ resolution of his congestion/ rhonchi/ wheezing => I suspect he is about as good as he is going to be and this is likely his optimal time to proceed w/ the life-saving abdominal surg for his gastric carcinoma...  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    Bilat small pulm nodules on CT chest 02/2015 + centrilobular emphysema> small bilat lung nodules are unchanged serial w/ CT scans in 2016, 2017, 2018...  Cigarette smoker> He has been smoking regularly since age 62 est 80+pack-yr smoking hx, he is proud that he has decr from 2.5ppd to 1ppd & then down  to 1/2ppd; we discussed this & I stressed the need to quit completely!    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  Weight loss/ underweight/ poor appetite => ADENOCARCINOMA of the Stomach> He says his norm wt was ~150# and he has lost down to 100#; we reviewed nutrition, advised 3 meals + Ensure/ Sustacal/ Boost daily; Pt referred to GI-DrJacobs w/ EGD showing Adenocarcinoma of the stomach => referred to Oncology- DrFeng for chemorx & Surg- DrByerly... EXAM showed Afeb, VSS w/ BP=132/82, O2sat=88% on RA & 96% on 2L/min; Wt up to 131 #, 5'7"Tall, BMI=20;  HEENT- neg, mallampati1; Chest- clear w/ decr BS bilat w/ prolonged exp phase; Heart- RR w/o m/r/g; Abd- thin, soft, sl tender to palp +abd bruits; Ext- w/o c/c/e but pulses are diminished distally + left femoral bruit; Neuro- no focal neuro deficits...  Spirometry 11/15/17>  We attempted repeat spirometry but his technique was very poor & results unreliable... IMP/PLAN>>  I have asked Bobby Caldwell to cut back further on his smoking & work on cessation but he frankly said he would not be able to do any better than this!  He agreed to continue the DUONEB Qid, & cut the Prednisone to '10mg'$  Qam;  He is asked to wear his oxygen at 1L/min Qhs and 2L/min w/ activity but I am skeptical of his compliance;  we provided him w/ another months worth of Symbicort160 & Spirva Respimat...  I will contact DrByerly regarding his upcoming surg...   Referred by JMinette BrineNP at [facility] (Dr. RGlendale Caldwell --- [facility] and Soc[facility] per PCP is reviewed... Limited English  hampers medical history>  He is [place] speaks little english-- we used a tOptometristover the speaker phone  to communicate w/ him... (call interpretor line at [phone] to set this up & if he is at home a 3-way conf call can be set up using his home phone [phone]) Alternatively-- pt can call his daughter on cell phone & she can translate...   Past Medical History:  Diagnosis Date  . COPD (chronic obstructive pulmonary disease) ([place])   . Dyspnea   . PAD (peripheral artery disease) ([place])   . Stomach cancer ([place])   . Tobacco abuse   . Weight loss     Past Surgical History:  Procedure Laterality Date  . ABDOMINAL AORTOGRAM W/LOWER EXTREMITY N/A 05/03/2017   Procedure: ABDOMINAL AORTOGRAM W/LOWER EXTREMITY;  Surgeon: Conrad Socorro, MD;  Location: [place] [facility];  Service: Cardiovascular;  Laterality: N/A;  . ESOPHAGOGASTRODUODENOSCOPY     05/24/17  . ESOPHAGOGASTRODUODENOSCOPY (EGD) WITH PROPOFOL N/A 05/24/2017   Procedure: ESOPHAGOGASTRODUODENOSCOPY (EGD) WITH PROPOFOL;  Surgeon: [name], MD;  Location: [facility];  Service: Endoscopy;  Laterality: N/A;  . EUS N/A 06/07/2017   Procedure: UPPER ENDOSCOPIC ULTRASOUND (EUS) RADIAL;  Surgeon: [name], MD;  Location: [facility];  Service: Endoscopy;  Laterality: N/A;  . FEMORAL-FEMORAL BYPASS GRAFT Bilateral 05/08/2017   Procedure: LEFT TO RIGHT BYPASS GRAFT FEMORAL-FEMORAL ARTERY;  Surgeon: Conrad Slidell, MD;  Location: [place];  Service: Vascular;  Laterality: Bilateral;  . IR FLUORO GUIDE PORT INSERTION RIGHT  07/05/2017  . IR US GUIDE VASC ACCESS RIGHT  07/05/2017  . NM MYOVIEW LTD  06/2017   LOW RISK. Small basal inferoseptal defect thought to be related to artifact (although cannot exclude prior infarct). Normal wall motion i in this area would suggest artifact not infarct. Otherwise no ischemia or infarction.  Marland Kitchen PERIPHERAL VASCULAR INTERVENTION Left 05/03/2017   Procedure: PERIPHERAL VASCULAR  INTERVENTION;  Surgeon: Conrad Loma, MD;  Location: [place] [facility];  Service: Cardiovascular;  Laterality: Left;  common iliac  . TRANSTHORACIC ECHOCARDIOGRAM  06/26/2017   Normal LV size and function. EF 60-65%. No wall motion abnormalities. GR 1 DD. Mild LA dilation    Outpatient Encounter Medications as of 11/15/2017  Medication Sig  . Albuterol Sulfate (PROAIR RESPICLICK) 569 (90 Base) MCG/ACT AEPB Inhale 2 puffs into the lungs every 6 (six) hours as needed.  Marland Kitchen atorvastatin (LIPITOR) 10 MG tablet Take 1 tablet (10 mg total) by mouth daily.  . budesonide-formoterol (Bobby Caldwell) 160-4.5 MCG/ACT Caldwell Inhale 2 puffs into the lungs 2 (two) times daily.  . clopidogrel (PLAVIX) 75 MG tablet Take 1 tablet (75 mg total) by mouth daily.  Marland Kitchen dronabinol (MARINOL) 2.5 MG capsule Take 1 capsule (2.5 mg total) by mouth 2 (two) times daily before a meal.  . ipratropium-albuterol (DUONEB) 0.5-2.5 (3) MG/3ML SOLN Take 3 mLs by nebulization 4 (four) times daily.  Marland Kitchen lidocaine-prilocaine (EMLA) cream Apply to affected area once (Patient taking differently: Apply 1 application topically once as needed (For port-a-cath.). )  . ondansetron (ZOFRAN) 8 MG tablet Take 1 tablet (8 mg total) by mouth 2 (two) times daily as needed for refractory nausea / vomiting. Start on day 3 after chemotherapy.  Marland Kitchen oxyCODONE-acetaminophen (ROXICET) 5-325 MG tablet Take 1 tablet by mouth every 6 (six) hours as needed for severe pain.  . predniSONE (DELTASONE) 10 MG tablet Take 1 tablet (10 mg total) by mouth daily with breakfast.  . prochlorperazine (COMPAZINE) 10 MG tablet Take 1 tablet (10 mg total) by mouth every 6 (six) hours as needed (Nausea or vomiting).  Marland Kitchen  Tiotropium Bromide Monohydrate (Bobby Caldwell RESPIMAT) 2.5 MCG/ACT AERS Inhale 2 puffs into the lungs daily.  . [DISCONTINUED] dexamethasone (DECADRON) 4 MG tablet Take 2 tablets (8 mg total) by mouth daily. Start the day after chemotherapy for 2 days. Take with food.  .  [DISCONTINUED] methylPREDNISolone (MEDROL DOSEPAK) 4 MG TBPK tablet Take as directed  . [DISCONTINUED] predniSONE (DELTASONE) 20 MG tablet Take as directed   No facility-administered encounter medications on file as of 11/15/2017.     No Known Allergies   Current Medications, Allergies, Past Medical History, Past Surgical History, Family History, and Social History were reviewed in [employer] record.   Review of Systems            All symptoms NEG except where BOLDED >>  Constitutional:  F/C/S, fatigue, anorexia, unexpected weight change. HEENT:  HA, visual changes, hearing loss, earache, nasal symptoms, sore throat, mouth sores, hoarseness. Resp:  cough, sputum, hemoptysis; SOB, tightness, wheezing. Cardio:  CP, palpit, DOE, orthopnea, edema; leg weakness & pain w/ ambulation GI:  N/V/D/C, blood in stool; reflux, abd pain, distention, gas. GU:  dysuria, freq, urgency, hematuria, flank pain, voiding difficulty. MS:  joint pain, swelling, tenderness, decr ROM; neck pain, back pain, etc. Neuro:  HA, tremors, seizures, dizziness, syncope, weakness, numbness, gait abn. Skin:  suspicious lesions or skin rash. Heme:  adenopathy, bruising, bleeding. Psyche:  confusion, agitation, sleep disturbance, hallucinations, anxiety, depression suicidal.   Objective:   Physical Exam      Vital Signs:  Reviewed...   General:  WD, cachectic, 62 y/o WM in NAD; alert & oriented; pleasant & cooperative... HEENT:  /AT; Conjunctiva- pink, Sclera- nonicteric, EOM-wnl, PERRLA, EACs-clear, TMs-wnl; NOSE-clear; THROAT-clear & wnl.  Neck:  Supple w/ fair ROM; no JVD; normal carotid impulses w/o bruits; no thyromegaly or nodules palpated; no lymphadenopathy.  Chest:  Decr BS bilat, unable to augment BS voluntarily, clear without wheezes or rales; +congested cough/ rhonchi bilat Heart:  Epig PMI, Regular Rhythm; norm S1 & S2 without murmurs, rubs, or gallops detected. Abdomen:  Thin,  soft & nontender- no guarding or rebound; normal bowel sounds; no organomegaly or masses palpated; left abd/ groin bruit Ext:  Normal ROM; without deformities or arthritic changes; no varicose veins, venous insuffic, or edema; LE pulses absent Neuro:  No focal neuro deficits; sensory testing normal; gait normal & balance OK. Derm:  No lesions noted; no rash etc. Lymph:  No cervical, supraclavicular, axillary, or inguinal adenopathy palpated.   Assessment:      IMP >>   Severe COPD/ Emphysema, hypoxemic resp failure> on Bobby Caldwell & Bobby Caldwell once daily, Mucinex and Bobby Caldwell as needed;  SOB w/ ADLs at baseline...    Bilat small pulm nodules on CT chest 02/2015 + centrilobular emphysema>  Serial CT scans w/o change in small bilat pulm nodules 2016-2017-2018  Cigarette smoker> He has been smoking regularly since age 52, est 80+pack-yr smoking hx, he is proud that he has decr fro 2.5ppd to 1ppd & he promises to cut to 10cig/d...    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  Weight loss/ underweight/ poor appetite> He says his norm wt was ~150# and he has lost down to 100#; we  reviewed nutrition, advised Ensure/ Sustacal/ Boost daily...  PLAN >>  02/16/15>  Bobby Caldwell has been a life long smoker & has paid a price for this behavior w/ severe emphysema; despite still working at [employer] w/ 4 daughters to support- he continues to smoke 1ppd @$5 per pack= #150/mo & I have appealed to his parental instincts to quit smoking completely but he is not optimistic that he will be able to do so & unwilling to purchase smoking cessation aides like Bobby Caldwell, Bobby Caldwell replacement, etc... In  addition to smoking cessation we have prescribed SYMBICORT160- 2spBid, & Bobby Caldwell daily, along w/ Bobby Caldwell rescue Caldwell for prn use (coupons given taking advantage of all known discount programs)... For completeness I have rec a CT Chest (esp in light of his wt loss) and his PCP may want to consider CT Abd & Pelvis as well... He is rec to start eating better, 3 meals/day, plus nutritional supplements Bid;  Finally he needs the full benefits for avail immunization programs=> Bobby Caldwell, Bobby Caldwell, Bobby Caldwell shots, etc (we will leave this to his PCP to administer)...  03/30/15>  Bobby Caldwell is improved on the inhalers and we provided him w/ some samples and Rx but it is unclear if he will be able to afford them- discount cards & drug company programs have been provided...  08/02/15>   Pt has severe COPD/emphysema & still smoking 1ppd- must quit all smoking & take his Bobby Caldwell/ Bobby Caldwell regularly & Bobby Caldwell as needed; He can use MUCINEX600- 2Bid w/ fluids for thick phlegm. We plan ROV 61mo& will plan f/u CTChest at that time 01/10/16>   He must quit all smoking; f/u CT Chest is pending; continue Bobby Caldwell/ Bobby Caldwell/ Proair... 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) and is still smoking w/ a 70-80 pack yr hx, proud of the fact that he has cut back by a few cigarettes, refuses Bobby Caldwell etc;  Asked to quit completely ASAP (says he'll try to decr to 10 cig/d) & continue resp treatments w/ Bobby Caldwell, Bobby Caldwell daily, Mucinex & Proair;  He is most concerned about his progressive leg symptoms=> clearly related to his severe peripheral vasc dis (ectatic distal Ao w/ mural thrombus, bilat common iliac dis, etc) and he is in need of ASAP eval by [facility]VS where arteriography will hopefully point to bypass surg that will help his symptoms;  In addition his weight loss & prot-cal malnutrition is alarming- thankfully his labs are essentially wnl & we reviewed nutritional supplements w/ ensure/  sustacal/ carnation instant/ etc..  09/04/17>   I again implored Bobby Caldwell to quit all smoking but he is not moved- indicates that 10cig/d is not that bad & he will not be able to cut this down further;  He now qualifies for Home O2 & we will set him up w/ home oxygen at 1L/min Qhs and 2L/min w/ activity (he indicated that he would only use it when he felt he needed it for SOB);  Finally we reviewed the need for regular dosing of his medications- Bobby Caldwell, Bobby Caldwell respimat- 2sp once daily, Mucinex600-1to2Bid w/ fluids, and the Bobby Caldwell as needed;  He certainly has a lot on him w/ stomach cancer (s/p chemorx & considering surg), PAD- s/p fem-fem bypass, other vasc dis, wt loss/ poor nutrition, etc...  10/22/17>   He has done reasonably well over the last 374yrw/ his severe COPD/emphysema, despite his continued smoking; He also did reasonably well w/ his major vascular procedure 04/2017; I suspect that his longevity-limiting disease  process currently is his cancer diagnosis, but his COPD will surely impact his operative risk & risk of complications (ASA-4); For his part- he wants to proceed w/ the surgery (he is ECOG=1); Given his progression to GOLD Stage 4 COPD, his hypoxemic resp failure, and the fact that he doesn't even use his inhalers unless he gets samples-- we discussed the option of switching to a Home NEB machine w/ Duoneb Qid + Bobby Caldwell & Bobby Caldwell once daily; plus OTC MUCUS RELIEF '400mg'$ - 2tabs tid w/ fluids; plus a course of oral PREDNISONE '20mg'$  Bid x2wks then one tab qam til return in 3-4wks before given the OK for Surg.   11/15/17>   I have asked Bobby Caldwell to cut back further on his smoking & work on cessation but he frankly said he would not be able to do any better than this!  He agreed to continue the DUONEB Qid, & cut the Prednisone to '10mg'$  Qam;  He is asked to wear his oxygen at 1L/min Qhs and 2L/min w/ activity but I am skeptical of his compliance;  we provided him w/ another months  worth of Symbicort160 & Spirva Respimat...  I will contact DrByerly regarding his upcoming surg.   Plan:     Home OXYGEN at 2L/min w/ activity & 1L/min Qhs...  Patient's Medications  New Prescriptions   PREDNISONE (DELTASONE) 10 MG TABLET    Take 1 tablet (10 mg total) by mouth daily with breakfast.  Previous Medications   ALBUTEROL SULFATE (PROAIR RESPICLICK) 607 (90 BASE) MCG/ACT AEPB    Inhale 2 puffs into the lungs every 6 (six) hours as needed.   ATORVASTATIN (LIPITOR) 10 MG TABLET    Take 1 tablet (10 mg total) by mouth daily.   BUDESONIDE-FORMOTEROL (Bobby Caldwell) 160-4.5 MCG/ACT Caldwell    Inhale 2 puffs into the lungs 2 (two) times daily.   CLOPIDOGREL (PLAVIX) 75 MG TABLET    Take 1 tablet (75 mg total) by mouth daily.   DRONABINOL (MARINOL) 2.5 MG CAPSULE    Take 1 capsule (2.5 mg total) by mouth 2 (two) times daily before a meal.   IPRATROPIUM-ALBUTEROL (DUONEB) 0.5-2.5 (3) MG/3ML SOLN    Take 3 mLs by nebulization 4 (four) times daily.   LIDOCAINE-PRILOCAINE (EMLA) CREAM    Apply to affected area once   ONDANSETRON (ZOFRAN) 8 MG TABLET    Take 1 tablet (8 mg total) by mouth 2 (two) times daily as needed for refractory nausea / vomiting. Start on day 3 after chemotherapy.   OXYCODONE-ACETAMINOPHEN (ROXICET) 5-325 MG TABLET    Take 1 tablet by mouth every 6 (six) hours as needed for severe pain.   PROCHLORPERAZINE (COMPAZINE) 10 MG TABLET    Take 1 tablet (10 mg total) by mouth every 6 (six) hours as needed (Nausea or vomiting).   TIOTROPIUM BROMIDE MONOHYDRATE (Bobby Caldwell RESPIMAT) 2.5 MCG/ACT AERS    Inhale 2 puffs into the lungs daily.  Modified Medications   No medications on file  Discontinued Medications   DEXAMETHASONE (DECADRON) 4 MG TABLET    Take 2 tablets (8 mg total) by mouth daily. Start the day after chemotherapy for 2 days. Take with food.   METHYLPREDNISOLONE (MEDROL DOSEPAK) 4 MG TBPK TABLET    Take as directed   PREDNISONE (DELTASONE) 20 MG TABLET    Take as directed

## 2017-11-15 NOTE — Patient Instructions (Addendum)
Today we updated your med list in our EPIC system...    Continue your current medications the same...   Continue with your OXYGEN therapy - 1L/min at night, and 2L/min w/ exercise...  Continue the NEBULIZER w/ DUONEB 4 times daily...  Continue the SYMBICORT160- 2 sprays twice daily...  Continue the SPIRIVA Respimatt- 2 sprays once daily...  We are going to wean the PREDNISONE down to 10mg  - one tab each AM & stay on this...  Continue to work on smoking cessation...  I will call DrByerly & tell her it is OK to proceed with your stomach cancer surgery...  Call for any questions...  Let's plan a follow up visit in 82months, sooner if needed for problems.Marland KitchenMarland Kitchen

## 2017-11-16 ENCOUNTER — Telehealth: Payer: Self-pay | Admitting: Pulmonary Disease

## 2017-11-16 MED ORDER — IPRATROPIUM-ALBUTEROL 0.5-2.5 (3) MG/3ML IN SOLN: 3.0000 mL | Freq: Four times a day (QID) | RESPIRATORY_TRACT | 2 refills | Status: DC

## 2017-11-16 NOTE — Telephone Encounter (Signed)
Felix Pacini that I sent his rx sent to [place]. Nothing further is needed.

## 2017-11-21 ENCOUNTER — Other Ambulatory Visit: Payer: Self-pay

## 2017-11-21 ENCOUNTER — Telehealth: Payer: Self-pay | Admitting: Hematology

## 2017-11-21 ENCOUNTER — Encounter (HOSPITAL_COMMUNITY): Payer: Self-pay

## 2017-11-21 ENCOUNTER — Encounter (HOSPITAL_COMMUNITY)
Admission: RE | Admit: 2017-11-21 | Discharge: 2017-11-21 | Disposition: A | Discharge status: Home / Self Care | Payer: BLUE CROSS/BLUE SHIELD | Source: Ambulatory Visit | Attending: General Surgery | Admitting: General Surgery

## 2017-11-21 DIAGNOSIS — I739 Peripheral vascular disease, unspecified: Secondary | ICD-10-CM | POA: Insufficient documentation

## 2017-11-21 DIAGNOSIS — Z79899 Other long term (current) drug therapy: Secondary | ICD-10-CM | POA: Diagnosis not present

## 2017-11-21 DIAGNOSIS — R000 Tachycardia, unspecified: Secondary | ICD-10-CM | POA: Insufficient documentation

## 2017-11-21 DIAGNOSIS — Z01818 Encounter for other preprocedural examination: Secondary | ICD-10-CM | POA: Insufficient documentation

## 2017-11-21 DIAGNOSIS — F17210 Nicotine dependence, cigarettes, uncomplicated: Secondary | ICD-10-CM | POA: Diagnosis not present

## 2017-11-21 DIAGNOSIS — C169 Malignant neoplasm of stomach, unspecified: Secondary | ICD-10-CM | POA: Diagnosis not present

## 2017-11-21 DIAGNOSIS — J449 Chronic obstructive pulmonary disease, unspecified: Secondary | ICD-10-CM | POA: Diagnosis not present

## 2017-11-21 DIAGNOSIS — Z9981 Dependence on supplemental oxygen: Secondary | ICD-10-CM | POA: Insufficient documentation

## 2017-11-21 DIAGNOSIS — Z7902 Long term (current) use of antithrombotics/antiplatelets: Secondary | ICD-10-CM | POA: Insufficient documentation

## 2017-11-21 DIAGNOSIS — Z7952 Long term (current) use of systemic steroids: Secondary | ICD-10-CM | POA: Diagnosis not present

## 2017-11-21 LAB — CBC
HCT: 46.7 % (ref 39.0–52.0)
Hemoglobin: 15.1 g/dL (ref 13.0–17.0)
MCH: 27.8 pg (ref 26.0–34.0)
MCHC: 32.3 g/dL (ref 30.0–36.0)
MCV: 85.8 fL (ref 78.0–100.0)
PLATELETS: 206 10*3/uL (ref 150–400)
RBC: 5.44 MIL/uL (ref 4.22–5.81)
RDW: 15.1 % (ref 11.5–15.5)
WBC: 10.1 10*3/uL (ref 4.0–10.5)

## 2017-11-21 LAB — URINALYSIS, ROUTINE W REFLEX MICROSCOPIC
Bilirubin Urine: NEGATIVE
GLUCOSE, UA: NEGATIVE mg/dL
HGB URINE DIPSTICK: NEGATIVE
Ketones, ur: NEGATIVE mg/dL
LEUKOCYTES UA: NEGATIVE
Nitrite: NEGATIVE
Protein, ur: NEGATIVE mg/dL
SPECIFIC GRAVITY, URINE: 1.013 (ref 1.005–1.030)
pH: 7 (ref 5.0–8.0)

## 2017-11-21 LAB — TYPE AND SCREEN
ABO/RH(D): B POS
Antibody Screen: NEGATIVE

## 2017-11-21 LAB — BASIC METABOLIC PANEL
Anion gap: 9 (ref 5–15)
BUN: 10 mg/dL (ref 6–20)
CALCIUM: 8.7 mg/dL — AB (ref 8.9–10.3)
CO2: 30 mmol/L (ref 22–32)
CREATININE: 0.62 mg/dL (ref 0.61–1.24)
Chloride: 100 mmol/L — ABNORMAL LOW (ref 101–111)
GFR calc non Af Amer: >60 mL/min (ref >60)
Glucose, Bld: 92 mg/dL (ref 65–99)
Potassium: 3.8 mmol/L (ref 3.5–5.1)
SODIUM: 139 mmol/L (ref 135–145)

## 2017-11-21 NOTE — Progress Notes (Addendum)
Anesthesia PAT Evaluation:  Case:  [id number] Date/Time:  11/27/17 0715   Procedures:      LAPAROSCOPY DIAGNOSTIC (N/A )     POSSIBLE PARTIAL GASTRECTOMY (N/A )     JEJUNOSTOMY TUBE PLACEMENT (N/A )   Anesthesia type:  General   Pre-op diagnosis:  Gastric cancer   Location:  [facility] OR ROOM 02 / [facility]   Surgeon:  [name], MD    Dr. Donnie Mesa will be assisting.    DISCUSSION: Patient is a 62 year old male originally from [place] (emigrated to [place] '98) who is scheduled for the above procedure. He does speak some [place], but preferred language is [place]. Interpreter used through [employer]. He is accompanied by wife today. Unfortunately, he thought today was his surgery date and not his pre-operative visit. He was disappointed, as he is ready to go ahead with surgery as it is a big source of worry.   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  Surgeon has requested anesthesia consult for epidural placement and evaluation of pulmonary status. Dr. [name] writes that she discussed case with his pulmonologist Dr. Lenna Gilford. Although patient with severe COPD, he has "survived much and done well". He is felt high risk, but acceptable for surgery. He was re-evaluated by Dr. Lenna Gilford on 11/15/17 and was doing better after April's medication adjustments (including prednisone taper) to optimize him for general surgery. He asked patient to cut back on smoking and work on cessation, but he said he "would not be able  to do any better than this!". He agreed to continue Duoneb QID and cut prednisone to 10 mg Q AM although Dr. Lenna Gilford was skeptical of patient's compliance. He was provided another months worth of Symbicort and Spiriva. He wrote, "I suspect he is about as good as he is going to be and this is likely his optimal time to proceed w/ the life-saving abdominal surg for his gastric carcinoma..."  Patient also has cardiology input by Dr. Glenetta Hew and Rosaria Ferries, NP.  He had been classified as "at least moderate risk" due to COPD prior to FFBG. He had cardiac testing 06/2017 (see below). On 10/11/17, Rhonda Barrett wrote, "no further cardiac workup is indicated and he is at increased but acceptable risk for the planned." Although permission to hold Plavix given, Dr. Ellyn Hack recommended clarifying with Dr. Bridgett Larsson since patient is on Plavix for PAD and not CAD. I have sent a staff message to Dr. Bridgett Larsson notifying him that Plavix on hold starting 11/19/17.   Patient without acute cardiopulmonary symptoms today. No conversational dyspnea. He sleeps on his right side as he tends to feel SOB when he sleeps on his back or left side. He reports compliance with his pulmonology medications. He remains on prednisone. Denied fever. Occasional productive cough. He is not sleeping well currently--only a few hours at a time at least partly due to worry about his diagnosis/prognosis and upcoming surgery. He does not want to be hospitalized for very long as he wants to get back home with his family. I  advised him to talk with Dr. [name] (I'll also send her a staff message since he was adamant about not wanting to stay hospitalized very long). Discussed that Dr. [name] had also requested anesthesia place epidural to help with pain management. He denied prior back surgeries. His last Plavix dose was on 11/19/17. History and plans for epidural also reviewed with anesthesiologist Dr. Suzette Battiest. Anesthesiologist to re-evaluate on the  day of surgery. Currently he is without acute cardiopulmonary issues, so would anticipate that he can proceed as planned if no acute changes.   Bobby Caldwell at Dr. Ernestina Penna office contacted because patient said he did not recall receiving a call to have his Power Port flushed and believes he is overdue. Last flush was in March, so Bobby Caldwell will ask their scheduling staff to contact him for a port flush prior to surgery date.)  VS: BP (!) 149/95   Pulse (!) 104   Temp 36.7 C   Resp 20   Ht 5\' 5"  (1.651 m)   Wt 131 lb 11.2 oz (59.7 kg)   SpO2 96%   BMI 21.92 kg/m  Heart regular rhythm, rate @ 104 bpm. Heart sounds were somewhat distant, possibly due to underlying pulmonary disease. No murmur noted. Lungs sounds were diminished but clear. No pre-tibial edema noted.   PROVIDERS: Minette Brine, NP is PCP Patient Care Team: [name], MD as Consulting Physician (Pulmonary Disease) [name], MD as Consulting Physician (Hematology) [name], MD as Attending Physician (Gastroenterology) [name], MD as Consulting Physician (General Surgery) Adele Barthel, MD as Vascular Surgery. Last visit 11/14/17. Patient's left CIA stent may need PTA for in-stent restenosis, but patient's gastric cancer currently taking precedence.   LABS: Preoperative labs noted. Unable to get ABG at PAT, so will need to get one on the day of surgery.  (all labs ordered are listed, but only abnormal results are displayed)  Labs Reviewed  BASIC METABOLIC PANEL - Abnormal; Notable for the following components:      Result Value   Chloride 100 (*)    Calcium 8.7 (*)    All other components within normal limits  URINALYSIS, ROUTINE W REFLEX MICROSCOPIC  CBC  BLOOD GAS, ARTERIAL  TYPE AND SCREEN    IMAGES: CXR 09/11/17:  IMPRESSION: COPD changes with bibasilar atelectasis. No acute infiltrate.  CTA chest/abd/pelvis 09/10/17: IMPRESSION: 1. No acute findings identified within the abdomen or pelvis. 2. Interval  development of multiple bilateral pulmonary densities within both lower lobes. These are indeterminate, likely post infectious or inflammatory in etiology. Atypical infection not excluded. Metastatic disease is considered less favored but not entirely excluded and follow-up imaging to ensure resolution is advised. 3. No evidence to suggest recurrent tumor or metastatic disease within the abdomen or pelvis. 4.  Aortic Atherosclerosis (ICD10-I70.0).  OTHER: Spirometry results as outlined by Dr. Lenna Gilford:  Spirometry 11/15/17>  We attempted repeat spirometry but his technique was very poor & results unreliable...   Spirometry 10/22/17> (fair tracing) FVC=1.79 (44%), FEV1=0.91 (29%), %1sec=51, mid-flows reduced at 10% of the predicted norm... now indicative of GOLD Stage 4 COPD.   EKG: 11/21/17 ST at 101 bpm. (EKG repeated because he was mildly tachycardic ~ 104-108 with vitals.) Previous 09/11/17 tracing showed NSR, T wave abnormality, consider anterior ischemia.   CV:  Nuclear stress test 06/29/17:  Nuclear stress EF: 50%. There is septal wall akinesis  Defect 1: There is a small defect of moderate severity present in the basal inferoseptal location.  Findings consistent with  prior myocardial infarction in the basal septal wall region.  This is a low risk study. Basal septal perfusion defect noted. However, echocardiogram shows normal ejection fraction with no wall motion abnormalities. Question if septal changes are artifactual. (Results reviewed by Dr. Ellyn Hack who wrote on 06/30/17, "The stress test appears to be LOW RISK. There is a suggestion of prior "heart attack" - but this does not correlate with Echocardiogram findings. This is relatively reassuring & should be acceptable to proceed with cancer treatment.")  Echo 06/26/17: Study Conclusions - Left ventricle: The cavity size was normal. Systolic function was   normal. The estimated ejection fraction was in the range of 60%   to  65%. Wall motion was normal; there were no regional wall   motion abnormalities. Doppler parameters are consistent with   abnormal left ventricular relaxation (grade 1 diastolic   dysfunction). - Left atrium: The atrium was mildly dilated.  Past Medical History:  Diagnosis Date  . COPD (chronic obstructive pulmonary disease) ([place])   . Dyspnea   . PAD (peripheral artery disease) ([place])   . Stomach cancer ([place])   . Tobacco abuse   . Weight loss     Past Surgical History:  Procedure Laterality Date  . ABDOMINAL AORTOGRAM W/LOWER EXTREMITY N/A 05/03/2017   Procedure: ABDOMINAL AORTOGRAM W/LOWER EXTREMITY;  Surgeon: Conrad [place], MD;  Location: [facility];  Service: Cardiovascular;  Laterality: N/A;  . ESOPHAGOGASTRODUODENOSCOPY     05/24/17  . ESOPHAGOGASTRODUODENOSCOPY (EGD) WITH PROPOFOL N/A 05/24/2017   Procedure: ESOPHAGOGASTRODUODENOSCOPY (EGD) WITH PROPOFOL;  Surgeon: [name], MD;  Location: [facility];  Service: Endoscopy;  Laterality: N/A;  . EUS N/A 06/07/2017   Procedure: UPPER ENDOSCOPIC ULTRASOUND (EUS) RADIAL;  Surgeon: [name], MD;  Location: [facility];  Service: Endoscopy;  Laterality: N/A;  . FEMORAL-FEMORAL BYPASS GRAFT Bilateral 05/08/2017   Procedure: LEFT TO RIGHT BYPASS GRAFT FEMORAL-FEMORAL ARTERY;  Surgeon: Conrad [place], MD;  Location: [facility];  Service: Vascular;  Laterality: Bilateral;  . IR FLUORO GUIDE PORT INSERTION RIGHT  07/05/2017  . IR US GUIDE VASC ACCESS RIGHT  07/05/2017  . NM MYOVIEW LTD  06/2017   LOW RISK. Small basal inferoseptal defect thought to be related to artifact (although cannot exclude prior infarct). Normal wall motion i in this area would suggest artifact not infarct. Otherwise no ischemia or infarction.  Marland Kitchen PERIPHERAL VASCULAR INTERVENTION Left 05/03/2017   Procedure: PERIPHERAL VASCULAR INTERVENTION;  Surgeon: Conrad , MD;  Location: [facility];  Service: Cardiovascular;  Laterality: Left;   common iliac  . TRANSTHORACIC ECHOCARDIOGRAM  06/26/2017   Normal LV size and function. EF 60-65%. No wall motion abnormalities. GR 1 DD. Mild LA dilation    MEDICATIONS: . Albuterol Sulfate (PROAIR RESPICLICK) 295 (90 Base) MCG/ACT AEPB  . atorvastatin (LIPITOR) 10 MG tablet  . budesonide-formoterol (SYMBICORT) 160-4.5 MCG/ACT inhaler  . clopidogrel (PLAVIX) 75 MG tablet  . dronabinol (MARINOL) 2.5 MG capsule  . ipratropium-albuterol (DUONEB) 0.5-2.5 (3) MG/3ML SOLN  . lidocaine-prilocaine (EMLA) cream  . ondansetron (ZOFRAN) 8 MG tablet  . oxyCODONE-acetaminophen (ROXICET) 5-325 MG tablet  . predniSONE (DELTASONE) 10 MG tablet  . prochlorperazine (COMPAZINE) 10 MG tablet  . Tiotropium Bromide Monohydrate (SPIRIVA RESPIMAT) 2.5 MCG/ACT AERS   No current facility-administered medications for this encounter.    Bobby Caldwell [facility] Short Stay Center/Anesthesiology Phone [phone] 11/21/2017 4:58 PM

## 2017-11-21 NOTE — Telephone Encounter (Signed)
Left message for patient regarding appointment per 5/15 sch msg

## 2017-11-21 NOTE — Progress Notes (Signed)
Unable to get ABG at pat appointment will need to draw DOS

## 2017-11-21 NOTE — Pre-Procedure Instructions (Addendum)
Huntley Knoop  11/21/2017      [address], [address] [place]. [place] [address] Phone: [phone] Fax: [phone]    Your procedure is scheduled on May 21  Report to [facility] at [place].M.  Call this number if you have problems the morning of surgery:  [phone]   Remember:  Do not eat food or drink liquids after midnight.  Please complete your PRE-SURGERY ENSURE that was given to before you leave your house the morning of surgery.  Please, if able, drink it in one setting. DO NOT SIP.   Continue all medications as directed by your physician except follow these medication instructions before surgery below   Take these medicines the morning of surgery with A SIP OF WATER   Albuterol Sulfate (PROAIR RESPICLICK) ipratropium-albuterol (DUONEB) predniSONE (DELTASONE)  7 days prior to surgery STOP taking any Aspirin(unless otherwise instructed by your surgeon), Aleve, Naproxen, Ibuprofen, Motrin, Advil, Goody's, BC's, all herbal medications, fish oil, and all vitamins  FOLLOW PHYSICIANS INSTRUCTIONS ABOUT PLAVIX   Do not wear jewelry  Do not wear lotions, powders, or cologne, or deodorant.  Men may shave face and neck.  Do not bring valuables to the hospital.  [facility] is not responsible for any belongings or valuables.  Contacts, dentures or bridgework may not be worn into surgery.  Leave your suitcase in the car.  After surgery it may be brought to your room.  For patients admitted to the hospital, discharge time will be determined by your treatment team.  Patients discharged the day of surgery will not be allowed to drive home.    Special instructions:   [place]- Preparing For Surgery  Before surgery, you can play an important role. Because skin is not sterile, your skin needs to be as free of germs as possible. You can reduce the number of germs on your skin by washing with  CHG (chlorahexidine gluconate) Soap before surgery.  CHG is an antiseptic cleaner which kills germs and bonds with the skin to continue killing germs even after washing.  Oral Hygiene is also important to reduce your risk of infection.  Remember - BRUSH YOUR TEETH THE MORNING OF SURGERY  Please do not use if you have an allergy to CHG or antibacterial soaps. If your skin becomes reddened/irritated stop using the CHG.  Do not shave (including legs and underarms) for at least 48 hours prior to first CHG shower. It is OK to shave your face.  Please follow these instructions carefully.   1. Shower the NIGHT BEFORE SURGERY and the MORNING OF SURGERY with CHG.   2. If you chose to wash your hair, wash your hair first as usual with your normal shampoo.  3. After you shampoo, rinse your hair and body thoroughly to remove the shampoo.  4. Use CHG as you would any other liquid soap. You can apply CHG directly to the skin and wash gently with a scrungie or a clean washcloth.   5. Apply the CHG Soap to your body ONLY FROM THE NECK DOWN.  Do not use on open wounds or open sores. Avoid contact with your eyes, ears, mouth and genitals (private parts). Wash Face and genitals (private parts)  with your normal soap.  6. Wash thoroughly, paying special attention to the area where your surgery will be performed.  7. Thoroughly rinse your body with warm water from the neck down.  8. DO NOT shower/wash with your normal soap after using and rinsing off the CHG Soap.  9. Pat yourself dry with a CLEAN TOWEL.  10. Wear CLEAN PAJAMAS to bed the night before surgery, wear comfortable clothes the morning of surgery  11. Place CLEAN SHEETS on your bed the night of your first shower and DO NOT SLEEP WITH PETS.    Day of Surgery:  Do not apply any deodorants/lotions.  Please wear clean clothes to the hospital/surgery center.   Remember to brush your teeth.    r  Please read over the following fact sheets that  you were given.

## 2017-11-21 NOTE — Patient Instructions (Signed)
Interpreter being requested

## 2017-11-21 NOTE — Progress Notes (Signed)
PCP - Minette Brine Cardiologist - denies Dr. Leeanne Deed  Chest x-ray - 09/11/17 EKG - 09/10/17 Stress Test - 2018 ECHO - 2018 Cardiac Cath - denies   Blood Thinner Instructions: last dose of plavix 5/13 Aspirin Instructions:  Wears 2 L of oxygen during the day and at night as needed Has a cough  Anesthesia review: Ebony Hail, PA came to see patient in pre-admission  Patient denies fever and chest pain at PAT appointment   Patient verbalized understanding of instructions that were given to them at the PAT appointment. Patient was also instructed that they will need to review over the PAT instructions again at home before surgery.

## 2017-11-23 ENCOUNTER — Inpatient Hospital Stay: Payer: BLUE CROSS/BLUE SHIELD | Attending: Hematology

## 2017-11-23 NOTE — H&P (Signed)
Bobby Caldwell Location: [facility] Patient #: [id number] DOB: [birth year] Married / Language: [place] / Race: Undefined Male   History of Present Illness The patient is a 62 year old male who presents for a follow-up for Gastric cancer. Patient is a 62 year old male diagnosed with stomach cancer by Dr. [name] 05/2017. The patient has severe COPD and serial severe peripheral vascular disease. He had rest pain in his lower extremities. He just underwent fem-fem BPG with Dr. Bridgett Larsson and Dr. Oneida Alar 05/08/2017. Because of weight loss and anorexia, he underwent EGD. He was found to have a large distal gastric mass. Biopsies were positive for adenocarcinoma.  He has undergone neoadjuvant chemotherapy wtih Dr. Burr Medico. He did relatively well with this. his last chemo dose was 3/19. He was admitted 3/4-3/6 for COPD exacerbation with pneumonia. Restaging studies negative for metastatic dx. He reports significant shortness of breath with walking, and gets short of breath within 10 feet or so. His weight has been stable while on chemotherapy. He did not regain all of the weight he lost, but he has not lost any more.   CT chest 09/10/17 Review of the MIP images confirms the above findings.  IMPRESSION: 1. Negative for pulmonary embolus. 2. New peribronchovascular consolidation and nodularity in both lower lobes, most indicative of an infectious bronchiolitis/bronchopneumonia. 3. Emphysema (ICD10-J43.9).  Ct abd/pelvis 09/10/17 Review of the MIP images confirms the above findings.  IMPRESSION: 1. Negative for pulmonary embolus. 2. New peribronchovascular consolidation and nodularity in both lower lobes, most indicative of an infectious bronchiolitis/bronchopneumonia. 3. Emphysema (ICD10-J43.9).  Dr. Lenna Gilford is his pulmonologist.   Pt is accompanied by [place] interpreter.  EGD 05/24/17 Findings: The stomach was abnormal: there was mass-like region in the distal stomach that was  ulcerated, heaped up and laterally spreading but not circumferential. The mass measures about 4cm across and is proximal to the pylorus by 3-4cm. This was sampled extensively and labeled distal gastric mass. The mucosa throughout the remaining stomach was also irregular appearing (mild to moderate gastritis). This was most apparent about mid body of the stomach where there was a stellate apearance of the mucosa. This was extensively biopsied as well.  FINAL DIAGNOSIS Diagnosis 1. Stomach, biopsy, mass distal - ADENOCARCINOMA, SEE COMMENT. - CHRONIC ACTIVE GASTRITIS WITH INTESTINAL METAPLASIA. 2. 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 - NO DYSPLASIA OR MALIGNANCY.  Labs: CMET K 3.3, Cr 0.56, albumin 3.1, nl cbc   CT c/a/p 04/02/2017 IMPRESSION: 1. Aortic atherosclerosis with fusiform ectasia of the infrarenal abdominal aorta which measures up to 2.6 x 2.3 cm, and contains a large burden of mural thrombus. 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 2. Today's study was not a CTA examination, which limits assessment of vascular abnormalities. Additionally, secondary to extensive patient respiratory motion, accurate assessment of small vessels is limited. With these limitations in mind, there is potential stenosis of both the origin of the celiac axis and the origin of the inferior mesenteric artery. Given the patient's history of intermittent abdominal pain, further evaluation with CTA of the abdomen and pelvis should be considered if clinically appropriate, and if the patient can be counseled to adequately hold his breath during the examination to allow for optimal image quality. 3. No other acute findings are noted in the chest, abdomen or pelvis to account for the patient's symptoms. 4. Diffuse bronchial wall  thickening with moderate  centrilobular and paraseptal emphysema; imaging findings suggestive of underlying COPD. 5. Multiple small pulmonary nodules measuring up to 6 mm, stable compared to prior examinations dating back take 02/22/2015, considered benign. No future follow-up needed. This recommendation follows the consensus statement: Guidelines for Management of Incidental Pulmonary Nodules Detected on CT Images: From the Fleischner Society 2017; Radiology 2017; 284:228-243. 6. Additional incidental findings, as above. Aortic Atherosclerosis (ICD10-I70.0) and Emphysema (ICD10-J43.9).   Allergies  No Known Drug Allergies [06/05/2017]:  Medication History ProAir HFA (108 (90 Base)MCG/ACT Aerosol Soln, Inhalation) Active. Atorvastatin Calcium (10MG  Tablet, Oral) Active. Symbicort (160-4.5MCG/ACT Aerosol, Inhalation) Active. Clopidogrel Bisulfate (75MG  Tablet, Oral) Active. Dexamethasone (4MG  Tablet, Oral) Active. Dronabinol (2.5MG  Capsule, Oral) Active. Lidocaine-Prilocaine (2.5-2.5% Cream, External) Active. MethylPREDNISolone (4MG  Tab Ther Pack, Oral) Active. Ondansetron HCl (8MG  Tablet, Oral) Active. Oxycodone-Acetaminophen (5-325MG  Tablet, Oral) Active. Prochlorperazine Maleate (10MG  Tablet, Oral) Active. Tiotropium Bromide Monohydrate (2.5MCG/ACT Aerosol Soln, Inhalation) Active. Spiriva HandiHaler (18MCG Capsule, Inhalation) Active. Medications Reconciled    Review of Systems All other systems negative  Vitals  Weight: 118.5 lb Height: 66in Body Surface Area: 1.6 m Body Mass Index: 19.13 kg/m  Temp.: 98.42F(Oral)  Pulse: 81 (Regular)  P.OX: 85% (Room air) BP: 138/90 (Sitting, Left Arm, Standard)       Physical Exam General Mental Status-Alert. General Appearance-Consistent with stated age. Hydration-Well hydrated. Voice-Normal.  Head and Neck Head-normocephalic, atraumatic with no lesions or palpable  masses.  Eye Sclera/Conjunctiva - Bilateral-No scleral icterus.  Chest and Lung Exam Chest and lung exam reveals -quiet, even and easy respiratory effort with no use of accessory muscles. Inspection Chest Wall - Normal. Back - normal.  Breast - Did not examine.  Cardiovascular Cardiovascular examination reveals -normal pedal pulses bilaterally. Note: regular rate and rhythm  Abdomen Inspection-Inspection Normal. Palpation/Percussion Palpation and Percussion of the abdomen reveal - Soft, Non Tender, No Rebound tenderness, No Rigidity (guarding) and No hepatosplenomegaly.  Peripheral Vascular Upper Extremity Inspection - Bilateral - Normal - No Clubbing, No Cyanosis, No Edema, Pulses Intact. Lower Extremity Palpation - Edema - Bilateral - No edema.  Neurologic Neurologic evaluation reveals -alert and oriented x 3 with no impairment of recent or remote memory. Mental Status-Normal.  Musculoskeletal Global Assessment -Note: no gross deformities.  Normal Exam - Left-Upper Extremity Strength Normal and Lower Extremity Strength Normal. Normal Exam - Right-Upper Extremity Strength Normal and Lower Extremity Strength Normal.  Lymphatic Head & Neck  General Head & Neck Lymphatics: Bilateral - Description - Normal. Axillary  General Axillary Region: Bilateral - Description - Normal. Tenderness - Non Tender.    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 He has Caldwell better than I thought he would. The main barrier for him is his pulmonary status. If his pulmonologist thinks that his lungs are his life limiting disease, I would not offer surgery. He is at high risk no matter what his pulmonologist says of postoperative pneumonia and requiring artificial positive pressure ventilation. He is at high risk for trach.  He  does look better in person then he does on paper. He has maintained his weight and his pre-albumin is in the normal range. Once I hear from pulmonology we will either get him on the schedule for surgery or refer him for radiation. I reviewed the anatomy of where his tumor is and what would have to be removed. I advised him that if he has surgery he  would require a feeding tube and tube feeds. I am concerned about his continued smoking on his ability to heal his wounds and risk of hernia.  45 min spent in evaluation, examination, counseling, and coordination of care. over 30 min spent in counseling. Current Plans Pt Education - [facility] Free Text Education/Instructions: discussed with patient and provided information. COPD, SEVERE (J44.9) Impression: I am concerned about his pulmonary status. I have messaged Dr. Lenna Gilford regarding him. His shortness of breath with exertion and recent admission for COPD exacerbation are most concerning to me, and I wonder which disease is most life limiting to him.  He continues to smoke, but has decreased the number of cigarettes per day. I discussed that increasing activity, maintaining inhalers, and quitting smoking with maximize his pulmonary status, but will not resolve his pulmonary function.  He seemed to be under the impression that he could get a lung transplant to make this better.

## 2017-11-27 ENCOUNTER — Inpatient Hospital Stay (HOSPITAL_COMMUNITY)
Admission: RE | Admit: 2017-11-27 | Discharge: 2017-12-05 | DRG: 327 | Disposition: A | Discharge status: Home Health | Payer: Medicaid Other | Source: Ambulatory Visit | Attending: General Surgery | Admitting: General Surgery

## 2017-11-27 ENCOUNTER — Inpatient Hospital Stay (HOSPITAL_COMMUNITY): Payer: Medicaid Other | Admitting: Vascular Surgery

## 2017-11-27 ENCOUNTER — Encounter (HOSPITAL_COMMUNITY)
Admission: RE | Disposition: A | Discharge status: Home Health | Payer: Self-pay | Source: Ambulatory Visit | Attending: General Surgery

## 2017-11-27 ENCOUNTER — Other Ambulatory Visit: Payer: Self-pay

## 2017-11-27 ENCOUNTER — Encounter (HOSPITAL_COMMUNITY): Payer: Self-pay | Admitting: *Deleted

## 2017-11-27 ENCOUNTER — Inpatient Hospital Stay (HOSPITAL_COMMUNITY): Payer: Medicaid Other | Admitting: Certified Registered Nurse Anesthetist

## 2017-11-27 DIAGNOSIS — R634 Abnormal weight loss: Secondary | ICD-10-CM

## 2017-11-27 DIAGNOSIS — I10 Essential (primary) hypertension: Secondary | ICD-10-CM | POA: Diagnosis present

## 2017-11-27 DIAGNOSIS — E440 Moderate protein-calorie malnutrition: Secondary | ICD-10-CM

## 2017-11-27 DIAGNOSIS — I745 Embolism and thrombosis of iliac artery: Secondary | ICD-10-CM | POA: Diagnosis present

## 2017-11-27 DIAGNOSIS — J432 Centrilobular emphysema: Secondary | ICD-10-CM | POA: Diagnosis present

## 2017-11-27 DIAGNOSIS — K5900 Constipation, unspecified: Secondary | ICD-10-CM | POA: Diagnosis not present

## 2017-11-27 DIAGNOSIS — I700 Atherosclerosis of aorta: Secondary | ICD-10-CM | POA: Diagnosis present

## 2017-11-27 DIAGNOSIS — I959 Hypotension, unspecified: Secondary | ICD-10-CM | POA: Diagnosis not present

## 2017-11-27 DIAGNOSIS — J9690 Respiratory failure, unspecified, unspecified whether with hypoxia or hypercapnia: Secondary | ICD-10-CM

## 2017-11-27 DIAGNOSIS — I513 Intracardiac thrombosis, not elsewhere classified: Secondary | ICD-10-CM | POA: Diagnosis present

## 2017-11-27 DIAGNOSIS — J431 Panlobular emphysema: Secondary | ICD-10-CM

## 2017-11-27 DIAGNOSIS — K2950 Unspecified chronic gastritis without bleeding: Secondary | ICD-10-CM | POA: Diagnosis present

## 2017-11-27 DIAGNOSIS — I739 Peripheral vascular disease, unspecified: Secondary | ICD-10-CM | POA: Diagnosis present

## 2017-11-27 DIAGNOSIS — C163 Malignant neoplasm of pyloric antrum: Principal | ICD-10-CM

## 2017-11-27 DIAGNOSIS — J449 Chronic obstructive pulmonary disease, unspecified: Secondary | ICD-10-CM

## 2017-11-27 DIAGNOSIS — R0602 Shortness of breath: Secondary | ICD-10-CM | POA: Diagnosis present

## 2017-11-27 DIAGNOSIS — Z7902 Long term (current) use of antithrombotics/antiplatelets: Secondary | ICD-10-CM

## 2017-11-27 HISTORY — PX: LAPAROSCOPIC PARTIAL GASTRECTOMY: SHX5908

## 2017-11-27 HISTORY — PX: LAPAROSCOPY: SHX197

## 2017-11-27 HISTORY — PX: LAPAROSCOPIC INSERTION GASTROSTOMY TUBE: SHX6817

## 2017-11-27 LAB — CBC
HCT: 39.5 % (ref 39.0–52.0)
HCT: 34.6 % — ABNORMAL LOW (ref 39.0–52.0)
HEMOGLOBIN: 12.8 g/dL — AB (ref 13.0–17.0)
Hemoglobin: 11 g/dL — ABNORMAL LOW (ref 13.0–17.0)
MCH: 28.1 pg (ref 26.0–34.0)
MCH: 27.6 pg (ref 26.0–34.0)
MCHC: 32.4 g/dL (ref 30.0–36.0)
MCHC: 31.8 g/dL (ref 30.0–36.0)
MCV: 86.8 fL (ref 78.0–100.0)
MCV: 86.9 fL (ref 78.0–100.0)
PLATELETS: 184 10*3/uL (ref 150–400)
PLATELETS: 175 10*3/uL (ref 150–400)
RBC: 4.55 MIL/uL (ref 4.22–5.81)
RBC: 3.98 MIL/uL — ABNORMAL LOW (ref 4.22–5.81)
RDW: 14.9 % (ref 11.5–15.5)
RDW: 14.8 % (ref 11.5–15.5)
WBC: 15.3 10*3/uL — ABNORMAL HIGH (ref 4.0–10.5)
WBC: 12.5 10*3/uL — ABNORMAL HIGH (ref 4.0–10.5)

## 2017-11-27 LAB — CREATININE, SERUM
CREATININE: 0.79 mg/dL (ref 0.61–1.24)
GFR calc Af Amer: >60 mL/min (ref >60)

## 2017-11-27 LAB — BLOOD GAS, ARTERIAL
Acid-Base Excess: 3.5 mmol/L — ABNORMAL HIGH (ref 0.0–2.0)
Bicarbonate: 27.8 mmol/L (ref 20.0–28.0)
DRAWN BY: 4498211
O2 Saturation: 96.6 %
PCO2 ART: 44.1 mmHg (ref 32.0–48.0)
PH ART: 7.415 (ref 7.350–7.450)
Patient temperature: 98.6
pO2, Arterial: 79.6 mmHg — ABNORMAL LOW (ref 83.0–108.0)

## 2017-11-27 LAB — MRSA PCR SCREENING: MRSA by PCR: NEGATIVE

## 2017-11-27 SURGERY — LAPAROSCOPY, DIAGNOSTIC
Anesthesia: General | Site: Abdomen

## 2017-11-27 MED ORDER — ALBUTEROL SULFATE HFA 108 (90 BASE) MCG/ACT IN AERS
INHALATION_SPRAY | RESPIRATORY_TRACT | Status: DC | PRN
  Administered 2017-11-27: 2 via RESPIRATORY_TRACT
  Administered 2017-11-27: 4 via RESPIRATORY_TRACT

## 2017-11-27 MED ORDER — EPHEDRINE SULFATE 50 MG/ML IJ SOLN
INTRAMUSCULAR | Status: AC
  Filled 2017-11-27: qty 1

## 2017-11-27 MED ORDER — DEXAMETHASONE SODIUM PHOSPHATE 10 MG/ML IJ SOLN
INTRAMUSCULAR | Status: DC | PRN
  Administered 2017-11-27: 10 mg via INTRAVENOUS

## 2017-11-27 MED ORDER — FAMOTIDINE IN NACL 20-0.9 MG/50ML-% IV SOLN
20.0000 mg | Freq: Two times a day (BID) | INTRAVENOUS | Status: DC
  Administered 2017-11-27 – 2017-11-29 (×5): 20 mg via INTRAVENOUS
  Filled 2017-11-27 (×5): qty 50

## 2017-11-27 MED ORDER — STERILE WATER FOR IRRIGATION IR SOLN
Status: DC | PRN
  Administered 2017-11-27: 1000 mL

## 2017-11-27 MED ORDER — ROPIVACAINE HCL 2 MG/ML IJ SOLN
10.0000 mL/h | INTRAMUSCULAR | Status: DC
  Administered 2017-11-27 – 2017-11-30 (×3): 10 mL/h via EPIDURAL
  Filled 2017-11-27 (×10): qty 200

## 2017-11-27 MED ORDER — DIPHENHYDRAMINE HCL 50 MG/ML IJ SOLN: 12.5000 mg | Freq: Four times a day (QID) | INTRAMUSCULAR | Status: DC | PRN

## 2017-11-27 MED ORDER — DEXAMETHASONE SODIUM PHOSPHATE 10 MG/ML IJ SOLN
INTRAMUSCULAR | Status: AC
  Filled 2017-11-27: qty 1

## 2017-11-27 MED ORDER — ACETAMINOPHEN 500 MG PO TABS
1000.0000 mg | ORAL_TABLET | ORAL | Status: AC
  Administered 2017-11-27: 1000 mg via ORAL
  Filled 2017-11-27: qty 2

## 2017-11-27 MED ORDER — ALBUMIN HUMAN 5 % IV SOLN
25.0000 g | Freq: Once | INTRAVENOUS | Status: AC
  Administered 2017-11-27: 25 g via INTRAVENOUS

## 2017-11-27 MED ORDER — ALBUTEROL SULFATE 108 (90 BASE) MCG/ACT IN AEPB: 2.0000 | INHALATION_SPRAY | Freq: Four times a day (QID) | RESPIRATORY_TRACT | Status: DC | PRN

## 2017-11-27 MED ORDER — MOMETASONE FURO-FORMOTEROL FUM 200-5 MCG/ACT IN AERO: 2.0000 | INHALATION_SPRAY | Freq: Two times a day (BID) | RESPIRATORY_TRACT | Status: DC

## 2017-11-27 MED ORDER — HYDROMORPHONE HCL 2 MG/ML IJ SOLN
INTRAMUSCULAR | Status: AC
  Filled 2017-11-27: qty 1

## 2017-11-27 MED ORDER — SUGAMMADEX SODIUM 200 MG/2ML IV SOLN: INTRAVENOUS | Status: DC | PRN

## 2017-11-27 MED ORDER — SUGAMMADEX SODIUM 200 MG/2ML IV SOLN
INTRAVENOUS | Status: DC | PRN
  Administered 2017-11-27: 150 mg via INTRAVENOUS
  Administered 2017-11-27: 50 mg via INTRAVENOUS

## 2017-11-27 MED ORDER — SODIUM CHLORIDE 0.9 % IV BOLUS
1000.0000 mL | Freq: Once | INTRAVENOUS | Status: AC
  Administered 2017-11-27: 1000 mL via INTRAVENOUS

## 2017-11-27 MED ORDER — SUGAMMADEX SODIUM 200 MG/2ML IV SOLN
INTRAVENOUS | Status: AC
  Filled 2017-11-27: qty 2

## 2017-11-27 MED ORDER — CHLORHEXIDINE GLUCONATE CLOTH 2 % EX PADS: 6.0000 | MEDICATED_PAD | Freq: Once | CUTANEOUS | Status: DC

## 2017-11-27 MED ORDER — LACTATED RINGERS IV SOLN
INTRAVENOUS | Status: DC | PRN
Start: 2017-11-27 — End: 2017-11-27
  Administered 2017-11-27 (×2): via INTRAVENOUS

## 2017-11-27 MED ORDER — BUPIVACAINE-EPINEPHRINE (PF) 0.25% -1:200000 IJ SOLN
INTRAMUSCULAR | Status: AC
  Filled 2017-11-27: qty 30

## 2017-11-27 MED ORDER — ACETAMINOPHEN 650 MG RE SUPP: 650.0000 mg | Freq: Four times a day (QID) | RECTAL | Status: DC | PRN

## 2017-11-27 MED ORDER — PROPOFOL 10 MG/ML IV BOLUS
INTRAVENOUS | Status: DC | PRN
  Administered 2017-11-27: 100 mg via INTRAVENOUS

## 2017-11-27 MED ORDER — LIDOCAINE 2% (20 MG/ML) 5 ML SYRINGE
INTRAMUSCULAR | Status: DC | PRN
  Administered 2017-11-27: 100 mg via INTRAVENOUS

## 2017-11-27 MED ORDER — HYDROMORPHONE HCL 2 MG/ML IJ SOLN
0.2500 mg | INTRAMUSCULAR | Status: DC | PRN
  Administered 2017-11-27: 0.3 mg via INTRAVENOUS

## 2017-11-27 MED ORDER — ONDANSETRON 4 MG PO TBDP
4.0000 mg | ORAL_TABLET | Freq: Four times a day (QID) | ORAL | Status: DC | PRN
  Administered 2017-12-04: 4 mg via ORAL
  Filled 2017-11-27: qty 1

## 2017-11-27 MED ORDER — LIDOCAINE HCL (PF) 1 % IJ SOLN
INTRAMUSCULAR | Status: AC
  Filled 2017-11-27: qty 30

## 2017-11-27 MED ORDER — ONDANSETRON HCL 4 MG/2ML IJ SOLN
INTRAMUSCULAR | Status: AC
  Filled 2017-11-27: qty 2

## 2017-11-27 MED ORDER — KCL IN DEXTROSE-NACL 20-5-0.45 MEQ/L-%-% IV SOLN
INTRAVENOUS | Status: DC
  Administered 2017-11-27 – 2017-11-30 (×5): via INTRAVENOUS
  Filled 2017-11-27 (×8): qty 1000

## 2017-11-27 MED ORDER — ROCURONIUM BROMIDE 10 MG/ML (PF) SYRINGE
PREFILLED_SYRINGE | INTRAVENOUS | Status: DC | PRN
  Administered 2017-11-27: 60 mg via INTRAVENOUS
  Administered 2017-11-27 (×2): 20 mg via INTRAVENOUS

## 2017-11-27 MED ORDER — NICOTINE 14 MG/24HR TD PT24
14.0000 mg | MEDICATED_PATCH | Freq: Every day | TRANSDERMAL | Status: DC
  Administered 2017-11-27 – 2017-12-02 (×6): 14 mg via TRANSDERMAL
  Filled 2017-11-27 (×7): qty 1

## 2017-11-27 MED ORDER — ONDANSETRON HCL 4 MG/2ML IJ SOLN
4.0000 mg | Freq: Four times a day (QID) | INTRAMUSCULAR | Status: DC | PRN
  Administered 2017-12-02 – 2017-12-04 (×3): 4 mg via INTRAVENOUS
  Filled 2017-11-27 (×3): qty 2

## 2017-11-27 MED ORDER — FENTANYL CITRATE (PF) 250 MCG/5ML IJ SOLN
INTRAMUSCULAR | Status: AC
  Filled 2017-11-27: qty 5

## 2017-11-27 MED ORDER — ONDANSETRON HCL 4 MG/2ML IJ SOLN
INTRAMUSCULAR | Status: DC | PRN
  Administered 2017-11-27: 4 mg via INTRAVENOUS

## 2017-11-27 MED ORDER — PHENYLEPHRINE HCL 10 MG/ML IJ SOLN
INTRAVENOUS | Status: DC | PRN
  Administered 2017-11-27: 25 ug/min via INTRAVENOUS

## 2017-11-27 MED ORDER — BISACODYL 10 MG RE SUPP
10.0000 mg | Freq: Every day | RECTAL | Status: DC | PRN
  Administered 2017-12-04: 10 mg via RECTAL
  Filled 2017-11-27: qty 1

## 2017-11-27 MED ORDER — TIOTROPIUM BROMIDE MONOHYDRATE 2.5 MCG/ACT IN AERS: 2.0000 | INHALATION_SPRAY | Freq: Every day | RESPIRATORY_TRACT | Status: DC

## 2017-11-27 MED ORDER — ALBUMIN HUMAN 5 % IV SOLN
INTRAVENOUS | Status: AC
  Filled 2017-11-27: qty 500

## 2017-11-27 MED ORDER — FENTANYL CITRATE (PF) 250 MCG/5ML IJ SOLN
INTRAMUSCULAR | Status: DC | PRN
  Administered 2017-11-27: 50 ug via INTRAVENOUS
  Administered 2017-11-27 (×2): 25 ug via INTRAVENOUS
  Administered 2017-11-27 (×3): 50 ug via INTRAVENOUS

## 2017-11-27 MED ORDER — PROPOFOL 10 MG/ML IV BOLUS
INTRAVENOUS | Status: AC
  Filled 2017-11-27: qty 20

## 2017-11-27 MED ORDER — FENTANYL CITRATE (PF) 100 MCG/2ML IJ SOLN
25.0000 ug | INTRAMUSCULAR | Status: DC | PRN
  Administered 2017-11-28 – 2017-12-02 (×5): 25 ug via INTRAVENOUS
  Filled 2017-11-27 (×5): qty 2

## 2017-11-27 MED ORDER — MIDAZOLAM HCL 2 MG/2ML IJ SOLN
INTRAMUSCULAR | Status: AC
  Filled 2017-11-27: qty 2

## 2017-11-27 MED ORDER — ALBUTEROL SULFATE HFA 108 (90 BASE) MCG/ACT IN AERS
INHALATION_SPRAY | RESPIRATORY_TRACT | Status: AC
  Filled 2017-11-27: qty 6.7

## 2017-11-27 MED ORDER — PHENYLEPHRINE 40 MCG/ML (10ML) SYRINGE FOR IV PUSH (FOR BLOOD PRESSURE SUPPORT)
PREFILLED_SYRINGE | INTRAVENOUS | Status: DC | PRN
  Administered 2017-11-27: 120 ug via INTRAVENOUS
  Administered 2017-11-27 (×2): 80 ug via INTRAVENOUS

## 2017-11-27 MED ORDER — MIDAZOLAM HCL 2 MG/2ML IJ SOLN
INTRAMUSCULAR | Status: DC | PRN
  Administered 2017-11-27: 1 mg via INTRAVENOUS

## 2017-11-27 MED ORDER — ENOXAPARIN SODIUM 40 MG/0.4ML ~~LOC~~ SOLN: 40.0000 mg | SUBCUTANEOUS | Status: DC

## 2017-11-27 MED ORDER — METHOCARBAMOL 1000 MG/10ML IJ SOLN
500.0000 mg | Freq: Three times a day (TID) | INTRAMUSCULAR | Status: DC | PRN
  Filled 2017-11-27: qty 5

## 2017-11-27 MED ORDER — SODIUM CHLORIDE 0.9 % IJ SOLN
INTRAMUSCULAR | Status: AC
Start: 2017-11-27 — End: ?
  Filled 2017-11-27: qty 10

## 2017-11-27 MED ORDER — 0.9 % SODIUM CHLORIDE (POUR BTL) OPTIME
TOPICAL | Status: DC | PRN
  Administered 2017-11-27 (×2): 1000 mL

## 2017-11-27 MED ORDER — BUPIVACAINE-EPINEPHRINE (PF) 0.25% -1:200000 IJ SOLN
INTRAMUSCULAR | Status: DC | PRN
  Administered 2017-11-27: 3 mL

## 2017-11-27 MED ORDER — IPRATROPIUM-ALBUTEROL 0.5-2.5 (3) MG/3ML IN SOLN
3.0000 mL | Freq: Four times a day (QID) | RESPIRATORY_TRACT | Status: DC
  Administered 2017-11-27 – 2017-12-05 (×29): 3 mL via RESPIRATORY_TRACT
  Filled 2017-11-27 (×31): qty 3

## 2017-11-27 MED ORDER — CEFAZOLIN SODIUM-DEXTROSE 2-4 GM/100ML-% IV SOLN
2.0000 g | INTRAVENOUS | Status: AC
  Administered 2017-11-27: 2 g via INTRAVENOUS
  Filled 2017-11-27: qty 100

## 2017-11-27 MED ORDER — GABAPENTIN 300 MG PO CAPS
300.0000 mg | ORAL_CAPSULE | ORAL | Status: AC
  Administered 2017-11-27: 300 mg via ORAL
  Filled 2017-11-27: qty 1

## 2017-11-27 MED ORDER — SODIUM CHLORIDE 0.9 % IV BOLUS
500.0000 mL | Freq: Once | INTRAVENOUS | Status: AC
  Administered 2017-11-27: 500 mL via INTRAVENOUS

## 2017-11-27 MED ORDER — METOPROLOL TARTRATE 5 MG/5ML IV SOLN
5.0000 mg | Freq: Four times a day (QID) | INTRAVENOUS | Status: DC | PRN
  Administered 2017-11-29: 5 mg via INTRAVENOUS
  Filled 2017-11-27: qty 5

## 2017-11-27 MED ORDER — CEFAZOLIN SODIUM-DEXTROSE 2-4 GM/100ML-% IV SOLN
2.0000 g | Freq: Three times a day (TID) | INTRAVENOUS | Status: AC
  Administered 2017-11-27: 2 g via INTRAVENOUS
  Filled 2017-11-27: qty 100

## 2017-11-27 MED ORDER — ACETAMINOPHEN 325 MG PO TABS: 650.0000 mg | ORAL_TABLET | Freq: Four times a day (QID) | ORAL | Status: DC | PRN

## 2017-11-27 MED ORDER — DIPHENHYDRAMINE HCL 12.5 MG/5ML PO ELIX: 12.5000 mg | ORAL_SOLUTION | Freq: Four times a day (QID) | ORAL | Status: DC | PRN

## 2017-11-27 SURGICAL SUPPLY — 80 items
ADH SKN CLS APL DERMABOND .7 (GAUZE/BANDAGES/DRESSINGS) ×1
APL SRG 32X5 SNPLK LF DISP (MISCELLANEOUS)
BAG SPEC RTRVL LRG 6X4 10 (ENDOMECHANICALS) ×1
BLADE CLIPPER SURG (BLADE) IMPLANT
CANISTER SUCT 3000ML PPV (MISCELLANEOUS) ×2 IMPLANT
CHLORAPREP W/TINT 26ML (MISCELLANEOUS) ×2 IMPLANT
COVER SURGICAL LIGHT HANDLE (MISCELLANEOUS) ×2 IMPLANT
DECANTER SPIKE VIAL GLASS SM (MISCELLANEOUS) ×2 IMPLANT
DERMABOND ADVANCED (GAUZE/BANDAGES/DRESSINGS) ×1
DERMABOND ADVANCED .7 DNX12 (GAUZE/BANDAGES/DRESSINGS) ×1 IMPLANT
DRAIN CHANNEL 19F RND (DRAIN) ×4 IMPLANT
DRAIN PENROSE 1/2X36 STERILE (WOUND CARE) IMPLANT
DRAPE LAPAROSCOPIC ABDOMINAL (DRAPES) IMPLANT
DRAPE UTILITY XL STRL (DRAPES) IMPLANT
DRAPE WARM FLUID 44X44 (DRAPE) ×2 IMPLANT
DRSG COVADERM 4X10 (GAUZE/BANDAGES/DRESSINGS) IMPLANT
DRSG COVADERM 4X14 (GAUZE/BANDAGES/DRESSINGS) IMPLANT
ELECT BLADE 6.5 EXT (BLADE) ×2 IMPLANT
ELECT CAUTERY BLADE 6.4 (BLADE) ×2 IMPLANT
ELECT REM PT RETURN 9FT ADLT (ELECTROSURGICAL) ×2
ELECTRODE REM PT RTRN 9FT ADLT (ELECTROSURGICAL) ×1 IMPLANT
EVACUATOR SILICONE 100CC (DRAIN) IMPLANT
GAUZE SPONGE 4X4 12PLY STRL (GAUZE/BANDAGES/DRESSINGS) IMPLANT
GLOVE BIO SURGEON STRL SZ 6 (GLOVE) ×2 IMPLANT
GLOVE INDICATOR 6.5 STRL GRN (GLOVE) ×2 IMPLANT
GOWN STRL REUS W/ TWL LRG LVL3 (GOWN DISPOSABLE) ×3 IMPLANT
GOWN STRL REUS W/TWL 2XL LVL3 (GOWN DISPOSABLE) ×4 IMPLANT
GOWN STRL REUS W/TWL LRG LVL3 (GOWN DISPOSABLE) ×6
HEMOSTAT SURGICEL 2X14 (HEMOSTASIS) IMPLANT
KIT BASIN OR (CUSTOM PROCEDURE TRAY) ×2 IMPLANT
KIT TUBE JEJUNAL 16FR (CATHETERS) ×2 IMPLANT
KIT TURNOVER KIT B (KITS) ×2 IMPLANT
L-HOOK LAP DISP 36CM (ELECTROSURGICAL) ×2
LHOOK LAP DISP 36CM (ELECTROSURGICAL) ×1 IMPLANT
LOOP VESSEL MAXI BLUE (MISCELLANEOUS) IMPLANT
NS IRRIG 1000ML POUR BTL (IV SOLUTION) ×4 IMPLANT
PAD ARMBOARD 7.5X6 YLW CONV (MISCELLANEOUS) ×4 IMPLANT
PENCIL BUTTON HOLSTER BLD 10FT (ELECTRODE) ×2 IMPLANT
POUCH SPECIMEN RETRIEVAL 10MM (ENDOMECHANICALS) ×2 IMPLANT
RELOAD BLUE (STAPLE) IMPLANT
RELOAD PROXIMATE 75MM BLUE (ENDOMECHANICALS) ×6 IMPLANT
RELOAD STAPLER WHITE 60MM (STAPLE) IMPLANT
SCISSORS LAP 5X35 DISP (ENDOMECHANICALS) IMPLANT
SEALANT SURGICAL APPL DUAL CAN (MISCELLANEOUS) IMPLANT
SET IRRIG TUBING LAPAROSCOPIC (IRRIGATION / IRRIGATOR) ×2 IMPLANT
SHEARS HARMONIC ACE PLUS 36CM (ENDOMECHANICALS) ×2 IMPLANT
SLEEVE ENDOPATH XCEL 5M (ENDOMECHANICALS) ×2 IMPLANT
STAPLE ECHEON FLEX 60 POW ENDO (STAPLE) IMPLANT
STAPLER PROXIMATE 75MM BLUE (STAPLE) ×2 IMPLANT
STAPLER RELOAD WHITE 60MM (STAPLE)
STAPLER STANDARD HANDLE (STAPLE) IMPLANT
STAPLER VISISTAT 35W (STAPLE) ×2 IMPLANT
SUT ETHILON 2 0 FS 18 (SUTURE) ×4 IMPLANT
SUT MNCRL AB 4-0 PS2 18 (SUTURE) ×2 IMPLANT
SUT PDS AB 1 TP1 96 (SUTURE) ×4 IMPLANT
SUT PDS AB 3-0 SH 27 (SUTURE) ×8 IMPLANT
SUT PDS II 0 TP-1 LOOPED 60 (SUTURE) IMPLANT
SUT SILK 2 0 SH CR/8 (SUTURE) ×2 IMPLANT
SUT SILK 2 0 TIES 10X30 (SUTURE) ×2 IMPLANT
SUT SILK 3 0 SH CR/8 (SUTURE) ×4 IMPLANT
SUT SILK 3 0 TIES 10X30 (SUTURE) ×2 IMPLANT
SYS LAPSCP GELPORT 120MM (MISCELLANEOUS)
SYSTEM LAPSCP GELPORT 120MM (MISCELLANEOUS) IMPLANT
TIP INNERVISION DETACH 40FR (MISCELLANEOUS) IMPLANT
TIP INNERVISION DETACH 50FR (MISCELLANEOUS) IMPLANT
TIP INNERVISION DETACH 56FR (MISCELLANEOUS) IMPLANT
TIPS INNERVISION DETACH 40FR (MISCELLANEOUS)
TOWEL OR 17X24 6PK STRL BLUE (TOWEL DISPOSABLE) ×2 IMPLANT
TOWEL OR 17X26 10 PK STRL BLUE (TOWEL DISPOSABLE) ×2 IMPLANT
TRAY FOLEY MTR SLVR 14FR STAT (SET/KITS/TRAYS/PACK) ×2 IMPLANT
TRAY LAPAROSCOPIC MC (CUSTOM PROCEDURE TRAY) ×2 IMPLANT
TROCAR XCEL 12X100 BLDLESS (ENDOMECHANICALS) IMPLANT
TROCAR XCEL BLUNT TIP 100MML (ENDOMECHANICALS) IMPLANT
TROCAR XCEL NON-BLD 11X100MML (ENDOMECHANICALS) IMPLANT
TROCAR XCEL NON-BLD 5MMX100MML (ENDOMECHANICALS) ×2 IMPLANT
TUBE CONNECTING 12X1/4 (SUCTIONS) ×2 IMPLANT
TUBING INSUF HEATED (TUBING) ×2 IMPLANT
TUBING INSUFFLATION (TUBING) ×2 IMPLANT
WATER STERILE IRR 1000ML POUR (IV SOLUTION) ×2 IMPLANT
YANKAUER SUCT BULB TIP NO VENT (SUCTIONS) IMPLANT

## 2017-11-27 NOTE — Consult Note (Signed)
PULMONARY / CRITICAL CARE MEDICINE   Name: Bobby Caldwell MRN: [id number] DOB: Mar 04, 1956    ADMISSION DATE:  11/27/2017 CONSULTATION DATE: 11/27/2017  REFERRING MD:  Norman Clay  CHIEF COMPLAINT: Severe COPD status post partial gastrectomy  HISTORY OF PRESENT ILLNESS:       This is a 62 year old with severe COPD who we are asked to see perioperatively because of his underlying COPD.  He has an essentially lifelong smoking history having started at age 62 with a 70-80-pack-year history.  He was seen by Dr. Lenna Gilford earlier this month at which time spirometry showed gold stage III disease.  A CTA was subsequently performed which shows bullous changes and of note to bilateral lower lobe densities.  Severe peripheral vascular disease for which she normally takes Plavix and a statin.  Preoperative clearance included a radionuclide stress test on 06/29/2017 which showed no evidence of reversible ischemia.       Partial gastrectomy for gastric cancer was accomplished earlier today and the patient is transferred to the intensive care unit extubated with an epidural catheter in place.  EBL was estimated at 100.  He has marginal blood pressure on arrival a fluid bolus is hanging.  He reports good pain control and no shortness of breath.  PAST MEDICAL HISTORY :  He  has a past medical history of COPD (chronic obstructive pulmonary disease) ([place]), Dyspnea, PAD (peripheral artery disease) ([place]), Stomach cancer ([place]), Tobacco abuse, and Weight loss.  PAST SURGICAL HISTORY: He  has a past surgical history that includes ABDOMINAL AORTOGRAM W/LOWER EXTREMITY (N/A, 05/03/2017); PERIPHERAL VASCULAR INTERVENTION (Left, 05/03/2017); Femoral-femoral Bypass Graft (Bilateral, 05/08/2017); Esophagogastroduodenoscopy; Esophagogastroduodenoscopy (egd) with propofol (N/A, 05/24/2017); EUS (N/A, 06/07/2017); IR US Guide Vasc Access Right (07/05/2017); IR FLUORO GUIDE PORT INSERTION RIGHT (07/05/2017); transthoracic echocardiogram  (06/26/2017); and NM MYOVIEW LTD (06/2017).  No Known Allergies  No current facility-administered medications on file prior to encounter.    Current Outpatient Medications on File Prior to Encounter  Medication Sig  . ipratropium-albuterol (DUONEB) 0.5-2.5 (3) MG/3ML SOLN Take 3 mLs by nebulization 4 (four) times daily.  . predniSONE (DELTASONE) 10 MG tablet Take 1 tablet (10 mg total) by mouth daily with breakfast. (Patient taking differently: Take 20 mg by mouth daily with breakfast. )  . Albuterol Sulfate (PROAIR RESPICLICK) 641 (90 Base) MCG/ACT AEPB Inhale 2 puffs into the lungs every 6 (six) hours as needed. (Patient not taking: Reported on 11/19/2017)  . atorvastatin (LIPITOR) 10 MG tablet Take 1 tablet (10 mg total) by mouth daily. (Patient not taking: Reported on 11/19/2017)  . budesonide-formoterol (SYMBICORT) 160-4.5 MCG/ACT inhaler Inhale 2 puffs into the lungs 2 (two) times daily. (Patient not taking: Reported on 11/19/2017)  . clopidogrel (PLAVIX) 75 MG tablet Take 1 tablet (75 mg total) by mouth daily. (Patient not taking: Reported on 11/19/2017)  . dronabinol (MARINOL) 2.5 MG capsule Take 1 capsule (2.5 mg total) by mouth 2 (two) times daily before a meal. (Patient not taking: Reported on 11/19/2017)  . lidocaine-prilocaine (EMLA) cream Apply to affected area once (Patient not taking: Reported on 11/19/2017)  . ondansetron (ZOFRAN) 8 MG tablet Take 1 tablet (8 mg total) by mouth 2 (two) times daily as needed for refractory nausea / vomiting. Start on day 3 after chemotherapy. (Patient not taking: Reported on 11/19/2017)  . oxyCODONE-acetaminophen (ROXICET) 5-325 MG tablet Take 1 tablet by mouth every 6 (six) hours as needed for severe pain. (Patient not taking: Reported on 11/19/2017)  . prochlorperazine (COMPAZINE) 10 MG tablet Take 1  tablet (10 mg total) by mouth every 6 (six) hours as needed (Nausea or vomiting). (Patient not taking: Reported on 11/19/2017)  . Tiotropium Bromide  Monohydrate (SPIRIVA RESPIMAT) 2.5 MCG/ACT AERS Inhale 2 puffs into the lungs daily. (Patient not taking: Reported on 11/19/2017)    FAMILY HISTORY:  His indicated that his mother is deceased. He indicated that his father is deceased.   SOCIAL HISTORY: He  reports that he has been smoking cigarettes.  He has a 25.00 pack-year smoking history. He has never used smokeless tobacco. He reports that he does not drink alcohol or use drugs.  REVIEW OF SYSTEMS:   Not obtainable at present  SUBJECTIVE:  As above  VITAL SIGNS: BP 133/73   Pulse (!) 104   Temp (!) 97.5 F (36.4 C)   Resp 16   Ht 5\' 5"  (1.651 m)   Wt 130 lb (59 kg)   SpO2 97%   BMI 21.63 kg/m   HEMODYNAMICS:    VENTILATOR SETTINGS:    INTAKE / OUTPUT: No intake/output data recorded.  PHYSICAL EXAMINATION: General: He is alert and communicating appropriately as her language barrier allows. Cardiovascular: S1 and S2 are regular without murmur rub or gallop Lungs: Abrasions are unlabored there is quite good air movement throughout there is an occasional scattered wheeze. Abdomen: The abdomen is flat without any unusual tenderness.  There is a new left upper quadrant feeding tube in place. Musculoskeletal: There is no dependent edema the feet are warm   LABS:  BMET Recent Labs  Lab 11/21/17 1514  NA 139  K 3.8  CL 100*  CO2 30  BUN 10  CREATININE 0.62  GLUCOSE 92    Electrolytes Recent Labs  Lab 11/21/17 1514  CALCIUM 8.7*    CBC Recent Labs  Lab 11/21/17 1514  WBC 10.1  HGB 15.1  HCT 46.7  PLT 206    Coag's No results for input(s): APTT, INR in the last 168 hours.  Sepsis Markers No results for input(s): LATICACIDVEN, PROCALCITON, O2SATVEN in the last 168 hours.  ABG Recent Labs  Lab 11/27/17 0622  PHART 7.415  PCO2ART 44.1  PO2ART 79.6*    Liver Enzymes No results for input(s): AST, ALT, ALKPHOS, BILITOT, ALBUMIN in the last 168 hours.  Cardiac Enzymes No results for  input(s): TROPONINI, PROBNP in the last 168 hours.  Glucose No results for input(s): GLUCAP in the last 168 hours.  Imaging No results found.    DISCUSSION:      This is a 62 year old with severe underlying emphysema who is status post partial gastric resection for gastric cancer.  He also has a history of significant peripheral vascular disease and is status post femorofemoral bypass.  ASSESSMENT / PLAN:  PULMONARY A: Good air movement throughout bilaterally at present with only an occasional wheeze.  Plan to continue him on DuoNeb's and Dulera.  He has an epidural catheter in place which should be providing [place] with good pain control and allowing intervention for pulmonary toilet.  Pain control appears to be adequate with the epidural in place  CARDIOVASCULAR A: A radionuclide stress test on 06/29/2017 showed no reversible ischemia.  He does have a history of peripheral vascular disease for which she normally takes a statin and Plavix.  RENAL Preoperative creatinine was normal   GASTROINTESTINAL A: Prophylaxis is with Pepcid  HEMATOLOGIC A:   Prophylaxis with Lovenox has been ordered we will consult with surgery to determine whether or not the would like to administer that  agent prior to removal of his epidural catheter   Lars Masson, MD Pulmonary and [place] Pager: [phone]  11/27/2017, 12:51 PM

## 2017-11-27 NOTE — Progress Notes (Addendum)
[facility] Progress Note Patient Name: Bobby Caldwell DOB: 05-02-1956 MRN: [id number]   Date of Service  11/27/2017  HPI/Events of Note  Hypotension - BP = 98/66 and HR = 123 (Sinus Tachycardia).  Last LVEF = 60% to 65%. Hgb = 11.0.   eICU Interventions  Will order: 1. Bolus with 0.9 NaCl 1 liter IV over 1 hour now.      Intervention Category Major Interventions: Hypotension - evaluation and management  Lysle Dingwall 11/27/2017, 9:23 PM

## 2017-11-27 NOTE — Progress Notes (Signed)
Pt very hypotensive still.  Notified Dr. Pearline Cables of this.  Orders to give 1L bolus of NS and to decrease the dose of ropivacaine (epidural) from 10ml/hr to 75ml/hr.  CBC HGB stable (12.8).  Notified Dr. Orene Desanctis (anesthesia).  MD ordered to turn off ropivacaine and to give 250mg  of albumin.  All has been given and BP has stabilized.  Pt more responsive.  Will continue to monitor.

## 2017-11-27 NOTE — Anesthesia Procedure Notes (Signed)
Arterial Line Insertion Performed by: Rica Koyanagi, MD, Colin Benton, CRNA, CRNA  Preanesthetic checklist: patient identified, IV checked, site marked, risks and benefits discussed, surgical consent, monitors and equipment checked, pre-op evaluation, timeout performed and anesthesia consent radial was placed Catheter size: 20 G Hand hygiene performed , maximum sterile barriers used  and Seldinger technique used  Attempts: 1 Procedure performed without using ultrasound guided technique. Following insertion, Biopatch and dressing applied. Post procedure assessment: normal  Patient tolerated the procedure well with no immediate complications.

## 2017-11-27 NOTE — Anesthesia Procedure Notes (Signed)
Procedure Name: Intubation Date/Time: 11/27/2017 7:46 AM Performed by: Bryson Corona, CRNA Pre-anesthesia Checklist: Patient identified, Emergency Drugs available, Suction available and Patient being monitored Patient Re-evaluated:Patient Re-evaluated prior to induction Oxygen Delivery Method: Circle System Utilized Preoxygenation: Pre-oxygenation with 100% oxygen Induction Type: IV induction Ventilation: Mask ventilation without difficulty Laryngoscope Size: Mac and 3 Grade View: Grade II Tube type: Oral Tube size: 8.0 mm Number of attempts: 1 Airway Equipment and Method: Stylet and Oral airway Placement Confirmation: ETT inserted through vocal cords under direct vision,  positive ETCO2 and breath sounds checked- equal and bilateral Secured at: 23 cm Tube secured with: Tape Dental Injury: Teeth and Oropharynx as per pre-operative assessment

## 2017-11-27 NOTE — Interval H&P Note (Signed)
History and Physical Interval Note:  11/27/2017 7:30 AM  Bobby Caldwell  has presented today for surgery, with the diagnosis of Gastric cancer  The various methods of treatment have been discussed with the patient and family. After consideration of risks, benefits and other options for treatment, the patient has consented to  Procedure(s): LAPAROSCOPY DIAGNOSTIC (N/A) POSSIBLE PARTIAL GASTRECTOMY (N/A) JEJUNOSTOMY TUBE PLACEMENT (N/A) as a surgical intervention .  The patient's history has been reviewed, patient examined, no change in status, stable for surgery.  I have reviewed the patient's chart and labs.  Questions were answered to the patient's satisfaction.     [name]

## 2017-11-27 NOTE — Anesthesia Postprocedure Evaluation (Signed)
Anesthesia Post Note  Patient: Chayton Murata  Procedure(s) Performed: LAPAROSCOPY DIAGNOSTIC (N/A Abdomen) PARTIAL GASTRECTOMY (N/A Abdomen) JEJUNOSTOMY TUBE PLACEMENT (N/A Abdomen)     Patient location during evaluation: PACU Anesthesia Type: General Level of consciousness: awake and oriented Pain management: pain level controlled Vital Signs Assessment: post-procedure vital signs reviewed and stable Respiratory status: spontaneous breathing, nonlabored ventilation, respiratory function stable and patient connected to nasal cannula oxygen Cardiovascular status: blood pressure returned to baseline and stable Postop Assessment: no apparent nausea or vomiting Anesthetic complications: no    Last Vitals:  Vitals:   11/27/17 1211 11/27/17 1215  BP:    Pulse: (!) 104   Resp: 16   Temp:  (!) 36.4 C  SpO2: 97%     Last Pain:  Vitals:   11/27/17 1215  TempSrc:   PainSc: Asleep                 Maudine Kluesner,JAMES TERRILL

## 2017-11-27 NOTE — Op Note (Signed)
PRE-OPERATIVE DIAGNOSIS: adenocarcinoma of the gastric antrum uT2N0, s/p neoadjuvant chemotherapy  POST-OPERATIVE DIAGNOSIS:  Same  PROCEDURE:  Procedure(s): Dx laparoscopy, distal gastrectomy with billroth II anastamosis, 16 Fr feeding tube.    SURGEON:  Surgeon(s): [name], MD  ASSISTANT:   Verita Lamb, MD  ANESTHESIA:   local and general  DRAINS: 16 Fr jejunostomy tube LOCAL MEDICATIONS USED:  BUPIVICAINE  and LIDOCAINE   SPECIMEN:  Source of Specimen:  distal stomach, distal stomach and left gastric lymph node  DISPOSITION OF SPECIMEN:  PATHOLOGY  COUNTS:  YES  DICTATION: .Dragon Dictation  PLAN OF CARE: Admit to inpatient, ICU due to pulmonary disease.    PATIENT DISPOSITION:  PACU - hemodynamically stable.  FINDINGS:  No evidence of carcinomatosis, mass in distal stomach on lesser curve, no palpably enlarged lymph nodes.  EBL: 100 mL  PROCEDURE:    The patient was identified in the holding area and was taken to the OR where he was placed supine on the operating room table.  General anesthesia was induced.  Arms were tucked, and foley catheter was placed.  Abdomen was prepped and draped in sterile fashion.  Timeout was performed according to the surgical safety checklist.  When all was correct, we continued.    The patient was rotated to the left and placed into reverse trendelenburg position.  A 5 mm incision was made at the costal margin and a 5 mm Optiview trocar was placed under direct visualization.  Pneumoperitoneum was achieved to a pressure of 15 mm Hg.  A second 5 mm trocar was placed in the midline.  The abdomen was examined with the camera and no evidence of carcinomatosis was seen.  An upper midline incision was made in the abdomen and the abdomen was then manually explored.  The stomach was mobile, and the pelvis and undersurface of the liver was palpated.  Again, no carcinomatosis was identified.    The Bookwalter was used to assist with visualization.   The stomach was mobilized. The duodenum was Kocherized. The omentum was taken off the colon and the lesser sac was entered.  The Harmonic was used to take down the gastroepiploics and the edge of the lesser curve and greater curve were exposed.  Two blue loads of the GIA 75 mm stapler were used to divide the stomach.  The harmonic was then used to dissect superiorly and inferiorly.  Once the duodenal bulb was identified, it was divided with another load of the GIA stapler.  The specimen was marked and sent to pathology.  The staple line of the duodenum and the stomach were oversewn with 3-0 PDS suture.  The left gastric/common hepatic artery were skeletonized and sent with the specimen.   The left gastric artery was skeletonized and the lymph nodes were taken off and sent for permanent.  A small palpable node was taken from the porta as well.    The gastrojejunostomy was then performed.  This was antecolic and retrogastric.  The jejunum was secured to the posterior wall of the stomach with two 3-0 silk sutures.  The stomach and jejunum were opened, and the 75 mm stapler was used to create a stapled linear anastamosis. The NGT was advanced into the afferent limb of the jejunum, and the defect was closed with two 3-0 PDS sutures in connell fashion.  The NGT was secured.  Additional silk sutures were used as anti-tension sutures.    An appropriate site was selected for the J tube in the left  abdomen.  30-40 cm beyond the gastrojejunostomy was selected.  The J tube was advanced through the abdomen.  A pursestring suture was placed of 3-0 silk.  The tube was placed into the jejunum.  The pursestring was tied down.  The balloon was inflated with 1 mL saline.  Witzel sutures of 3-0 silk were placed to secure the J tube.  The tube was then pexed to the abdominal wall with 2-0 silk.    The abdomen was then irrigated.  A sponge count was performed.  The fascia was then closed with two #1 looped PDS sutures.  The skin  was irrigated and closed with staples.  The abdomen was then cleaned, dried, and dressed with dry sterile dressings.    Needle, sponge, and instrument counts were correct x 2.  An epidural was placed.  The patient was allowed to emerge from anesthesia and taken to the PACU in stable condition.

## 2017-11-27 NOTE — Transfer of Care (Signed)
Immediate Anesthesia Transfer of Care Note  Patient: Bobby Caldwell  Procedure(s) Performed: LAPAROSCOPY DIAGNOSTIC (N/A Abdomen) PARTIAL GASTRECTOMY (N/A Abdomen) JEJUNOSTOMY TUBE PLACEMENT (N/A Abdomen)  Patient Location: PACU  Anesthesia Type:General  Level of Consciousness: drowsy  Airway & Oxygen Therapy: Patient Spontanous Breathing and Patient connected to face mask oxygen  Post-op Assessment: Report given to RN and Post -op Vital signs reviewed and stable  Post vital signs: Reviewed and stable  Last Vitals:  Vitals Value Taken Time  BP 158/94 11/27/2017 10:51 AM  Temp    Pulse 106 11/27/2017 10:55 AM  Resp 18 11/27/2017 10:55 AM  SpO2 99 % 11/27/2017 10:55 AM  Vitals shown include unvalidated device data.  Last Pain:  Vitals:   11/27/17 0550  TempSrc: Oral         Complications: No apparent anesthesia complications

## 2017-11-27 NOTE — Anesthesia Procedure Notes (Signed)
Epidural Patient location during procedure: OR Start time: 11/27/2017 10:10 AM End time: 11/27/2017 10:28 AM  Staffing Anesthesiologist: Rica Koyanagi, MD  Preanesthetic Checklist Completed: patient identified, pre-op evaluation, timeout performed, IV checked and risks and benefits discussed  Epidural Patient position: left lateral decubitus Prep: ChloraPrep and site prepped and draped Patient monitoring: heart rate, cardiac monitor and continuous pulse ox Approach: left paramedian Location: thoracic (1-12) Injection technique: LOR saline  Needle:  Needle type: Tuohy  Needle gauge: 18 G Needle length: 9 cm Needle insertion depth: 4 cm Catheter size: 19 Gauge Catheter at skin depth: 10 cm Test dose: 1.5% lidocaine with Epi 1:200 K and negative  Additional Notes After test dose , 4 cc 0.25% Marcaine placedReason for block:at surgeon's request

## 2017-11-27 NOTE — Anesthesia Preprocedure Evaluation (Addendum)
Anesthesia Evaluation  Patient identified by MRN, date of birth, ID band Patient awake    Reviewed: Allergy & Precautions, NPO status , Patient's Chart, lab work & pertinent test results  History of Anesthesia Complications Negative for: history of anesthetic complications  Airway Mallampati: II  TM Distance: >3 FB Neck ROM: Full    Dental  (+) Teeth Intact, Caps, Dental Advisory Given Multiple caps:   Pulmonary shortness of breath, COPD, Current Smoker,   + cough noted  breath sounds clear to auscultation       Cardiovascular hypertension, + Peripheral Vascular Disease and + DOE   Rhythm:Regular Rate:Normal     Neuro/Psych negative neurological ROS     GI/Hepatic Gastric ca    Endo/Other  negative endocrine ROS  Renal/GU negative Renal ROS     Musculoskeletal negative musculoskeletal ROS (+)   Abdominal   Peds  Hematology negative hematology ROS (+)   Anesthesia Other Findings   Reproductive/Obstetrics                           Anesthesia Physical Anesthesia Plan  ASA: IV  Anesthesia Plan: General   Post-op Pain Management:    Induction: Intravenous  PONV Risk Score and Plan: 2 and Ondansetron and Dexamethasone  Airway Management Planned: Oral ETT  Additional Equipment: Arterial line  Intra-op Plan:   Post-operative Plan: Extubation in OR  Informed Consent: I have reviewed the patients History and Physical, chart, labs and discussed the procedure including the risks, benefits and alternatives for the proposed anesthesia with the patient or authorized representative who has indicated his/her understanding and acceptance.   Dental advisory given  Plan Discussed with: CRNA  Anesthesia Plan Comments: (Plan for epidural if in fact case proceeds to open and discussed c patient)      Anesthesia Quick Evaluation

## 2017-11-27 NOTE — Progress Notes (Signed)
Pt hypotensive with SBP dropping into high 60's - low 70's.  Notified Dr. Pearline Cables of this.  CBC just sent.  500cc bolus NS ordered and started.  Will continue to monitor pt.

## 2017-11-28 ENCOUNTER — Encounter (HOSPITAL_COMMUNITY): Payer: Self-pay | Admitting: Anesthesiology

## 2017-11-28 ENCOUNTER — Encounter (HOSPITAL_COMMUNITY): Payer: Self-pay

## 2017-11-28 LAB — CBC WITH DIFFERENTIAL/PLATELET
Abs Immature Granulocytes: 0.1 10*3/uL (ref 0.0–0.1)
Basophils Absolute: 0 10*3/uL (ref 0.0–0.1)
Basophils Relative: 0 %
EOS ABS: 0 10*3/uL (ref 0.0–0.7)
EOS PCT: 0 %
HCT: 31.1 % — ABNORMAL LOW (ref 39.0–52.0)
HEMOGLOBIN: 10 g/dL — AB (ref 13.0–17.0)
Immature Granulocytes: 1 %
LYMPHS PCT: 5 %
Lymphs Abs: 0.6 10*3/uL — ABNORMAL LOW (ref 0.7–4.0)
MCH: 27.9 pg (ref 26.0–34.0)
MCHC: 32.2 g/dL (ref 30.0–36.0)
MCV: 86.6 fL (ref 78.0–100.0)
Monocytes Absolute: 1 10*3/uL (ref 0.1–1.0)
Monocytes Relative: 9 %
Neutro Abs: 9.9 10*3/uL — ABNORMAL HIGH (ref 1.7–7.7)
Neutrophils Relative %: 85 %
Platelets: 156 10*3/uL (ref 150–400)
RBC: 3.59 MIL/uL — AB (ref 4.22–5.81)
RDW: 15 % (ref 11.5–15.5)
WBC: 11.6 10*3/uL — AB (ref 4.0–10.5)

## 2017-11-28 LAB — GLUCOSE, CAPILLARY
Glucose-Capillary: 145 mg/dL — ABNORMAL HIGH (ref 65–99)
Glucose-Capillary: 118 mg/dL — ABNORMAL HIGH (ref 65–99)
Glucose-Capillary: 121 mg/dL — ABNORMAL HIGH (ref 65–99)

## 2017-11-28 LAB — BASIC METABOLIC PANEL
Anion gap: 6 (ref 5–15)
BUN: 7 mg/dL (ref 6–20)
CALCIUM: 7.7 mg/dL — AB (ref 8.9–10.3)
CO2: 28 mmol/L (ref 22–32)
Chloride: 105 mmol/L (ref 101–111)
Creatinine, Ser: 0.61 mg/dL (ref 0.61–1.24)
GFR calc Af Amer: >60 mL/min (ref >60)
GFR calc non Af Amer: >60 mL/min (ref >60)
Glucose, Bld: 155 mg/dL — ABNORMAL HIGH (ref 65–99)
Potassium: 4.5 mmol/L (ref 3.5–5.1)
SODIUM: 139 mmol/L (ref 135–145)

## 2017-11-28 LAB — CBC
HEMATOCRIT: 34 % — AB (ref 39.0–52.0)
HEMOGLOBIN: 10.8 g/dL — AB (ref 13.0–17.0)
MCH: 27.6 pg (ref 26.0–34.0)
MCHC: 31.8 g/dL (ref 30.0–36.0)
MCV: 87 fL (ref 78.0–100.0)
Platelets: 178 10*3/uL (ref 150–400)
RBC: 3.91 MIL/uL — ABNORMAL LOW (ref 4.22–5.81)
RDW: 14.8 % (ref 11.5–15.5)
WBC: 11.9 10*3/uL — AB (ref 4.0–10.5)

## 2017-11-28 LAB — MAGNESIUM: Magnesium: 1.8 mg/dL (ref 1.7–2.4)

## 2017-11-28 LAB — PHOSPHORUS: PHOSPHORUS: 2.1 mg/dL — AB (ref 2.5–4.6)

## 2017-11-28 MED ORDER — ARFORMOTEROL TARTRATE 15 MCG/2ML IN NEBU
15.0000 ug | INHALATION_SOLUTION | Freq: Two times a day (BID) | RESPIRATORY_TRACT | Status: DC
  Administered 2017-11-28 – 2017-12-05 (×14): 15 ug via RESPIRATORY_TRACT
  Filled 2017-11-28 (×18): qty 2

## 2017-11-28 MED ORDER — DEXTROSE 5 % IV SOLN
20.0000 mmol | Freq: Once | INTRAVENOUS | Status: AC
  Administered 2017-11-28: 20 mmol via INTRAVENOUS
  Filled 2017-11-28: qty 6.67

## 2017-11-28 MED ORDER — OSMOLITE 1.2 CAL PO LIQD
1000.0000 mL | ORAL | Status: DC
  Administered 2017-11-28: 1000 mL
  Filled 2017-11-28 (×3): qty 1000

## 2017-11-28 MED ORDER — SODIUM CHLORIDE 0.9 % IV BOLUS
1000.0000 mL | Freq: Once | INTRAVENOUS | Status: AC
  Administered 2017-11-28: 1000 mL via INTRAVENOUS

## 2017-11-28 MED ORDER — ALBUMIN HUMAN 5 % IV SOLN
25.0000 g | Freq: Once | INTRAVENOUS | Status: AC
  Administered 2017-11-28: 25 g via INTRAVENOUS
  Filled 2017-11-28: qty 500

## 2017-11-28 MED ORDER — BUDESONIDE 0.25 MG/2ML IN SUSP
0.2500 mg | Freq: Two times a day (BID) | RESPIRATORY_TRACT | Status: DC
Start: 2017-11-28 — End: 2017-12-05
  Administered 2017-11-28 – 2017-12-05 (×14): 0.25 mg via RESPIRATORY_TRACT
  Filled 2017-11-28 (×17): qty 2

## 2017-11-28 NOTE — Anesthesia Post-op Follow-up Note (Signed)
  Anesthesia Pain Follow-up Note  Patient: Bobby Caldwell  Day #: 1  Date of Follow-up: 11/28/2017 Time: 9:15 AM  Last Vitals:  Vitals:   11/28/17 0828 11/28/17 0832  BP:    Pulse:    Resp:    Temp:    SpO2: 99% 100%    Level of Consciousness: alert  Pain: none   Side Effects:None  Catheter Site Exam:clean  Epidural / Intrathecal (From admission, onward)   Start     Dose/Rate Route Frequency Ordered Stop   11/27/17 1100  ropivacaine (PF) 2 mg/mL (0.2%) (NAROPIN) injection     10 mL/hr 10 mL/hr  Epidural Continuous 11/27/17 1048         Plan: Continue current therapy of postop epidural at surgeon's request  [name]

## 2017-11-28 NOTE — Progress Notes (Signed)
Initial Nutrition Assessment  DOCUMENTATION CODES:   Non-severe (moderate) malnutrition in context of chronic illness  INTERVENTION:   Initiate trickle TF via J-tube Osmolite 1.2 @ 10 ml/hr to advance as directed per MD to goal rate of 65 ml/hr As able will add 30 ml Prostat daily  At goal provides:  1972 kcal, 101 grams protein, and 1265 ml free water.    NUTRITION DIAGNOSIS:   Moderate Malnutrition related to chronic illness(gastric cancer) as evidenced by moderate fat depletion, moderate muscle depletion.  GOAL:   Patient will meet greater than or equal to 90% of their needs  MONITOR:   TF tolerance, Diet advancement  REASON FOR ASSESSMENT:   Consult, Malnutrition Screening Tool Enteral/tube feeding initiation and management  ASSESSMENT:   Pt with PMH of severe COPD has smoked since age 7 and severe PVD admitted for gastric ca s/p partial gastrectomy with J tube placement 5/21.    Pt discussed during ICU rounds and with RN.  Pt has had hypotension which has required fluid boluses.  NG tube to suction  Pt reports that [place] he first entered the country he was 150 lb. At time of dx he was down to 100 lb. He has been able to regain weight and is now back up to 130 lb. He is on an appetite stimulant at home and has been eating well last few weeks. His physical exam shows depletion of both fat and muscle however expect that this is much improved since his cancer dx. Pt is hungry and wants to eat.  Consult received to start trickle TF via J tube.   Medications reviewed and include: 20 mmol Kphos x 1 Labs reviewed: PO4 2.1 (L)    NUTRITION - FOCUSED PHYSICAL EXAM:    Most Recent Value  Orbital Region  Moderate depletion  Upper Arm Region  Moderate depletion  Thoracic and Lumbar Region  Moderate depletion  Buccal Region  Moderate depletion  Temple Region  Moderate depletion  Clavicle Bone Region  Moderate depletion  Clavicle and Acromion Bone Region  Moderate  depletion  Scapular Bone Region  Unable to assess  Dorsal Hand  Moderate depletion  Patellar Region  Moderate depletion  Anterior Thigh Region  Mild depletion  Posterior Calf Region  Mild depletion  Edema (RD Assessment)  None  Hair  Reviewed  Eyes  Reviewed  Mouth  Reviewed  Skin  Reviewed  Nails  Reviewed       Diet Order:   Diet Order           Diet NPO time specified  Diet effective now          EDUCATION NEEDS:   Education needs have been addressed  Skin:  Skin Assessment: Reviewed RN Assessment  Last BM:  unknown  Height:   Ht Readings from Last 1 Encounters:  11/27/17 5\' 5"  (1.651 m)    Weight:   Wt Readings from Last 1 Encounters:  11/27/17 132 lb 7.9 oz (60.1 kg)    Ideal Body Weight:  61.8 kg  BMI:  Body mass index is 22.05 kg/m.  Estimated Nutritional Needs:   Kcal:  1700-2000  Protein:  85-100 grams  Fluid:  > 1.7 L/day  Maylon Peppers RD, LDN, CNSC [phone] Pager [phone] After Hours Pager

## 2017-11-28 NOTE — Addendum Note (Signed)
Addendum  created 11/28/17 6283 by [name], MD   Sign clinical note

## 2017-11-28 NOTE — Progress Notes (Signed)
Pt has been up in chair twice today and did great.  No complications and just standby assist.

## 2017-11-28 NOTE — Progress Notes (Signed)
PULMONARY / CRITICAL CARE MEDICINE   Name: Bobby Caldwell MRN: [id number] DOB: 1955/08/10    ADMISSION DATE:  11/27/2017 CONSULTATION DATE:  11/27/2017  REFERRING MD:  [name]  CHIEF COMPLAINT:  Severe COPD day 1 S/P partial gastric resection for CA  HISTORY OF PRESENT ILLNESS:     This is a 62 year old with severe COPD who we are asked to see perioperatively because of his underlying COPD.  He has an essentially lifelong smoking history having started at age 47 with a 70-80-pack-year history.  He was seen by Dr. Lenna Gilford earlier this month at which time spirometry showed gold stage III disease.  A CTA was subsequently performed which shows bullous changes and of note to bilateral lower lobe densities.  Severe peripheral vascular disease for which she normally takes Plavix and a statin.  Preoperative clearance included a radionuclide stress test on 06/29/2017 which showed no evidence of reversible ischemia.       Partial gastrectomy for gastric cancer was accomplished 5/21.  EBL was estimated at only 100 cc.  He was transferred to the intensive care unit with an epidural in place however shortly after arrival he became hypotensive and the epidural was temporarily discontinued and he received a 1.5 L crystalloid bolus.  He received another liter of crystalloid in the early hours of this morning. Hgb is down 4 grams from pre-op. NG output has not been impressive.      He denies dyspnea this morning, he reports adequate pain control     PAST MEDICAL HISTORY :  He  has a past medical history of COPD (chronic obstructive pulmonary disease) ([place]), Dyspnea, PAD (peripheral artery disease) ([place]), Stomach cancer ([place]), Tobacco abuse, and Weight loss.  PAST SURGICAL HISTORY: He  has a past surgical history that includes ABDOMINAL AORTOGRAM W/LOWER EXTREMITY (N/A, 05/03/2017); PERIPHERAL VASCULAR INTERVENTION (Left, 05/03/2017); Femoral-femoral Bypass Graft (Bilateral, 05/08/2017); Esophagogastroduodenoscopy;  Esophagogastroduodenoscopy (egd) with propofol (N/A, 05/24/2017); EUS (N/A, 06/07/2017); IR US Guide Vasc Access Right (07/05/2017); IR FLUORO GUIDE PORT INSERTION RIGHT (07/05/2017); transthoracic echocardiogram (06/26/2017); NM MYOVIEW LTD (06/2017); laparoscopy (N/A, 11/27/2017); Laparoscopic partial gastrectomy (N/A, 11/27/2017); and Laparoscopic insertion gastrostomy tube (N/A, 11/27/2017).  No Known Allergies  No current facility-administered medications on file prior to encounter.    Current Outpatient Medications on File Prior to Encounter  Medication Sig  . ipratropium-albuterol (DUONEB) 0.5-2.5 (3) MG/3ML SOLN Take 3 mLs by nebulization 4 (four) times daily.  . predniSONE (DELTASONE) 10 MG tablet Take 1 tablet (10 mg total) by mouth daily with breakfast. (Patient taking differently: Take 20 mg by mouth daily with breakfast. )  . Albuterol Sulfate (PROAIR RESPICLICK) 683 (90 Base) MCG/ACT AEPB Inhale 2 puffs into the lungs every 6 (six) hours as needed. (Patient not taking: Reported on 11/19/2017)  . atorvastatin (LIPITOR) 10 MG tablet Take 1 tablet (10 mg total) by mouth daily. (Patient not taking: Reported on 11/19/2017)  . budesonide-formoterol (SYMBICORT) 160-4.5 MCG/ACT inhaler Inhale 2 puffs into the lungs 2 (two) times daily. (Patient not taking: Reported on 11/19/2017)  . clopidogrel (PLAVIX) 75 MG tablet Take 1 tablet (75 mg total) by mouth daily. (Patient not taking: Reported on 11/19/2017)  . dronabinol (MARINOL) 2.5 MG capsule Take 1 capsule (2.5 mg total) by mouth 2 (two) times daily before a meal. (Patient not taking: Reported on 11/19/2017)  . lidocaine-prilocaine (EMLA) cream Apply to affected area once (Patient not taking: Reported on 11/19/2017)  . ondansetron (ZOFRAN) 8 MG tablet Take 1 tablet (8 mg total) by mouth 2 (  two) times daily as needed for refractory nausea / vomiting. Start on day 3 after chemotherapy. (Patient not taking: Reported on 11/19/2017)  .  oxyCODONE-acetaminophen (ROXICET) 5-325 MG tablet Take 1 tablet by mouth every 6 (six) hours as needed for severe pain. (Patient not taking: Reported on 11/19/2017)  . prochlorperazine (COMPAZINE) 10 MG tablet Take 1 tablet (10 mg total) by mouth every 6 (six) hours as needed (Nausea or vomiting). (Patient not taking: Reported on 11/19/2017)  . Tiotropium Bromide Monohydrate (SPIRIVA RESPIMAT) 2.5 MCG/ACT AERS Inhale 2 puffs into the lungs daily. (Patient not taking: Reported on 11/19/2017)    FAMILY HISTORY:  His indicated that his mother is deceased. He indicated that his father is deceased.   SOCIAL HISTORY: He  reports that he has been smoking cigarettes.  He has a 25.00 pack-year smoking history. He has never used smokeless tobacco. He reports that he does not drink alcohol or use drugs.  REVIEW OF SYSTEMS:    SUBJECTIVE:  As above  VITAL SIGNS: BP 119/66   Pulse (!) 106   Temp 98.4 F (36.9 C) (Oral)   Resp 19   Ht 5\' 5"  (1.651 m)   Wt 130 lb (59 kg)   SpO2 98%   BMI 21.63 kg/m   HEMODYNAMICS:    VENTILATOR SETTINGS:    INTAKE / OUTPUT: I/O last 3 completed shifts: In: 3334.8 [I.V.:2560; Other:100; IV Piggyback:674.8] Out: 9678 [Urine:3300; Emesis/NG output:75; Blood:250]  PHYSICAL EXAMINATION: General: Thin to cachectic male in no acute distress Neuro: Alert and appropriately interactive Cardiovascular: S1 and S2 are regular and somewhat distant without murmur rub or gallop. Lungs: Respirations are unlabored there is good air movement throughout there are few scattered wheezes Abdomen: The abdomen is flat the midline incision is dry there is no oozing from the feeding  tube site.   LABS:  BMET Recent Labs  Lab 11/21/17 1514 11/27/17 1251 11/28/17 0233  NA 139  --  139  K 3.8  --  4.5  CL 100*  --  105  CO2 30  --  28  BUN 10  --  7  CREATININE 0.62 0.79 0.61  GLUCOSE 92  --  155*    Electrolytes Recent Labs  Lab 11/21/17 1514 11/28/17 0233   CALCIUM 8.7* 7.7*  MG  --  1.8  PHOS  --  2.1*    CBC Recent Labs  Lab 11/27/17 1251 11/27/17 1950 11/28/17 0233  WBC 15.3* 12.5* 11.9*  HGB 12.8* 11.0* 10.8*  HCT 39.5 34.6* 34.0*  PLT 184 175 178    Coag's No results for input(s): APTT, INR in the last 168 hours.  Sepsis Markers No results for input(s): LATICACIDVEN, PROCALCITON, O2SATVEN in the last 168 hours.  ABG Recent Labs  Lab 11/27/17 0622  PHART 7.415  PCO2ART 44.1  PO2ART 79.6*    Liver Enzymes No results for input(s): AST, ALT, ALKPHOS, BILITOT, ALBUMIN in the last 168 hours.  Cardiac Enzymes No results for input(s): TROPONINI, PROBNP in the last 168 hours.  Glucose No results for input(s): GLUCAP in the last 168 hours.  Imaging No results found.   STUDIES:    DISCUSSION:      This is a 62 year old with severe peripheral vascular disease and severe COPD who is day 1 status post partial gastric resection for gastric cancer.  Postoperative course has been remarkable for transient hypotension  ASSESSMENT / PLAN:  PULMONARY A: I noted some of his bronchodilators fell off the MAR this morning  and I have reordered them so that he is now on a combination of inhaled corticosteroids, a long-acting beta-blocker, and DuoNeb's.  Of note he has recently received systemic steroids for his COPD and if he has recurrent issues with hypotension this may become a consideration.  Pain control appears to be adequate and we are not having difficulties with pulmonary toilet  CARDIOVASCULAR A: Initially I was comfortable entirely contributing his hypotension to his epidural catheter however he has had a drop in his hemoglobin overnight out of proportion to EBL at surgery.  A repeat CBC has been ordered for 1400 He was on a statin and Plavix for his peripheral vascular disease prior to admission.   Lars Masson, MD Critical Care Medicine [facility] Pager: [phone]  11/28/2017, 8:01 AM

## 2017-11-28 NOTE — Progress Notes (Signed)
Pt started to complain of abdominal pain.  Pt seemed to think it was the tube feeds that I had started around noon.  RN paused tube feeds temporarily and leaned pt forward to check the epidural site.  Epidural had come apart at the wire and the plastic piece.  RN fixed epidural catheter and called anesthesia to make them aware.  CRNA to come assess the epidural to assure it is hooked up right.  92mcg of Fentanyl given to patient for now.  Vital signs stable.  Will continue to monitor.

## 2017-11-28 NOTE — Progress Notes (Signed)
[facility] Progress Note Patient Name: Bobby Caldwell DOB: Nov 20, 1955 MRN: [id number]   Date of Service  11/28/2017  HPI/Events of Note  Hypotension - BP 89/62 with MAP = 72. HR now = 115.   eICU Interventions  Will bolus with 0.9 NaCl 1 liter IV over 1 hour now.      Intervention Category Major Interventions: Hypotension - evaluation and management  Sommer,Steven Eugene 11/28/2017, 2:24 AM

## 2017-11-28 NOTE — Progress Notes (Signed)
1 Day Post-Op   Subjective/Chief Complaint: Complains of hunger.  Wants to eat.     Objective: Vital signs in last 24 hours: Temp:  [97.5 F (36.4 C)-99.8 F (37.7 C)] 98.4 F (36.9 C) (05/22 0400) Pulse Rate:  [97-127] 114 (05/22 0600) Resp:  [12-24] 24 (05/22 0600) BP: (66-158)/(52-94) 106/65 (05/22 0600) SpO2:  [91 %-100 %] 97 % (05/22 0600) Arterial Line BP: (65-167)/(42-78) 68/65 (05/21 2000) Last BM Date: (PTA)  Intake/Output from previous day: 05/21 0701 - 05/22 0700 In: 3234.8 [I.V.:2460; IV Piggyback:674.8] Out: 1610 [Urine:3150; Emesis/NG output:75; Blood:250] Intake/Output this shift: No intake/output data recorded.  General appearance: alert, cooperative and no distress Resp: breathing comfortably GI: soft, non distended, dressing c/d/i. J tube in place. Extremities: extremities normal, atraumatic, no cyanosis or edema  Lab Results:  Recent Labs    11/27/17 1950 11/28/17 0233  WBC 12.5* 11.9*  HGB 11.0* 10.8*  HCT 34.6* 34.0*  PLT 175 178   BMET Recent Labs    11/27/17 1251 11/28/17 0233  NA  --  139  K  --  4.5  CL  --  105  CO2  --  28  GLUCOSE  --  155*  BUN  --  7  CREATININE 0.79 0.61  CALCIUM  --  7.7*   PT/INR No results for input(s): LABPROT, INR in the last 72 hours. ABG Recent Labs    11/27/17 0622  PHART 7.415  HCO3 27.8    Studies/Results: No results found.  Anti-infectives: Anti-infectives (From admission, onward)   Start     Dose/Rate Route Frequency Ordered Stop   11/27/17 1600  ceFAZolin (ANCEF) IVPB 2g/100 mL premix     2 g 200 mL/hr over 30 Minutes Intravenous Every 8 hours 11/27/17 1237 11/27/17 1554   11/27/17 0600  ceFAZolin (ANCEF) IVPB 2g/100 mL premix     2 g 200 mL/hr over 30 Minutes Intravenous On call to O.R. 11/27/17 0541 11/27/17 0750      Assessment/Plan: s/p Procedure(s): LAPAROSCOPY DIAGNOSTIC (N/A) PARTIAL GASTRECTOMY (N/A) JEJUNOSTOMY TUBE PLACEMENT (N/A) continue foley for urinary  output monitoring, hypotension, epidural  Nutrition consult today for trophic tube feeds (10 ml/hr) Continue ngt/npo.  Start sips tomorrow Ambulate, pulmonary toilet Appreciate PCCM for severe COPD management.  Looks good today.   Keep in icu for hypotension. CAD - no changes in cardiac monitoring tracings Gastric cancer, await pathology   LOS: 1 day    [name] 11/28/2017

## 2017-11-29 LAB — CBC WITH DIFFERENTIAL/PLATELET
Abs Immature Granulocytes: 0.1 10*3/uL (ref 0.0–0.1)
BASOS ABS: 0 10*3/uL (ref 0.0–0.1)
Basophils Relative: 0 %
EOS ABS: 0 10*3/uL (ref 0.0–0.7)
EOS PCT: 0 %
HEMATOCRIT: 34.5 % — AB (ref 39.0–52.0)
HEMOGLOBIN: 11 g/dL — AB (ref 13.0–17.0)
IMMATURE GRANULOCYTES: 1 %
LYMPHS ABS: 1.4 10*3/uL (ref 0.7–4.0)
LYMPHS PCT: 11 %
MCH: 27.9 pg (ref 26.0–34.0)
MCHC: 31.9 g/dL (ref 30.0–36.0)
MCV: 87.6 fL (ref 78.0–100.0)
Monocytes Absolute: 1.1 10*3/uL — ABNORMAL HIGH (ref 0.1–1.0)
Monocytes Relative: 9 %
NEUTROS PCT: 79 %
Neutro Abs: 10.5 10*3/uL — ABNORMAL HIGH (ref 1.7–7.7)
Platelets: 167 10*3/uL (ref 150–400)
RBC: 3.94 MIL/uL — AB (ref 4.22–5.81)
RDW: 15.1 % (ref 11.5–15.5)
WBC: 13.1 10*3/uL — AB (ref 4.0–10.5)

## 2017-11-29 LAB — BASIC METABOLIC PANEL
Anion gap: 7 (ref 5–15)
BUN: 7 mg/dL (ref 6–20)
CHLORIDE: 107 mmol/L (ref 101–111)
CO2: 26 mmol/L (ref 22–32)
Calcium: 8.5 mg/dL — ABNORMAL LOW (ref 8.9–10.3)
Creatinine, Ser: 0.52 mg/dL — ABNORMAL LOW (ref 0.61–1.24)
Glucose, Bld: 104 mg/dL — ABNORMAL HIGH (ref 65–99)
POTASSIUM: 4.4 mmol/L (ref 3.5–5.1)
SODIUM: 140 mmol/L (ref 135–145)

## 2017-11-29 LAB — GLUCOSE, CAPILLARY
GLUCOSE-CAPILLARY: 98 mg/dL (ref 65–99)
Glucose-Capillary: 101 mg/dL — ABNORMAL HIGH (ref 65–99)
Glucose-Capillary: 115 mg/dL — ABNORMAL HIGH (ref 65–99)
Glucose-Capillary: 147 mg/dL — ABNORMAL HIGH (ref 65–99)
Glucose-Capillary: 98 mg/dL (ref 65–99)
Glucose-Capillary: 99 mg/dL (ref 65–99)

## 2017-11-29 LAB — PHOSPHORUS
PHOSPHORUS: 2.1 mg/dL — AB (ref 2.5–4.6)
PHOSPHORUS: 1.9 mg/dL — AB (ref 2.5–4.6)

## 2017-11-29 LAB — MAGNESIUM
MAGNESIUM: 1.9 mg/dL (ref 1.7–2.4)
Magnesium: 1.9 mg/dL (ref 1.7–2.4)

## 2017-11-29 LAB — PROTIME-INR
INR: 0.99
PROTHROMBIN TIME: 12.9 s (ref 11.4–15.2)

## 2017-11-29 MED ORDER — KETOROLAC TROMETHAMINE 30 MG/ML IJ SOLN
30.0000 mg | Freq: Once | INTRAMUSCULAR | Status: AC
  Administered 2017-11-29: 30 mg via INTRAVENOUS
  Filled 2017-11-29: qty 1

## 2017-11-29 MED ORDER — FENTANYL CITRATE (PF) 100 MCG/2ML IJ SOLN: 25.0000 ug | INTRAMUSCULAR | Status: DC | PRN

## 2017-11-29 MED ORDER — TRAMADOL HCL 50 MG PO TABS
50.0000 mg | ORAL_TABLET | Freq: Four times a day (QID) | ORAL | Status: DC | PRN
  Administered 2017-11-30 – 2017-12-02 (×3): 50 mg via ORAL
  Filled 2017-11-29 (×3): qty 1

## 2017-11-29 MED ORDER — TRAMADOL HCL 50 MG PO TABS: 50.0000 mg | ORAL_TABLET | Freq: Two times a day (BID) | ORAL | Status: DC | PRN

## 2017-11-29 MED ORDER — OSMOLITE 1.2 CAL PO LIQD
1000.0000 mL | ORAL | Status: DC
  Filled 2017-11-29 (×4): qty 1000

## 2017-11-29 MED ORDER — ORAL CARE MOUTH RINSE
15.0000 mL | Freq: Two times a day (BID) | OROMUCOSAL | Status: DC
  Administered 2017-11-29 – 2017-12-04 (×8): 15 mL via OROMUCOSAL

## 2017-11-29 NOTE — Progress Notes (Signed)
PULMONARY / CRITICAL CARE MEDICINE   Name: Bobby Caldwell MRN: [id number] DOB: 06-25-1956    ADMISSION DATE:  11/27/2017 CONSULTATION DATE:  11/27/2017  REFERRING MD:  [name]  CHIEF COMPLAINT:  Severe COPD day 1 S/P partial gastric resection for CA  HISTORY OF PRESENT ILLNESS:     This is a 62 year old with severe COPD who we are asked to see perioperatively because of his underlying COPD.  He has an essentially lifelong smoking history having started at age 13 with a 70-80-pack-year history.  He was seen by Dr. Lenna Gilford earlier this month at which time spirometry showed gold stage III disease.  A CTA was subsequently performed which shows bullous changes and of note  bilateral lower lobe densities.  Severe peripheral vascular disease for which he normally takes Plavix and a statin.  Preoperative clearance included a radionuclide stress test on 06/29/2017 which showed no evidence of reversible ischemia.       Partial gastrectomy for gastric cancer was accomplished 5/21.  EBL was estimated at only 100 cc.  He was transferred to the intensive care unit with an epidural in place however shortly after arrival he became hypotensive and the epidural was temporarily discontinued and he received a 1.5 L crystalloid bolus.  He received another liter of crystalloid in the early hours of this morning. Hgb is down 4 grams from pre-op. NG output has not been impressive.      He is complaining of hunger and thirst this morning.  He also reports abdominal discomfort despite being on epidural.  He is trying to avoid opiates.  He tells me that his dyspnea is at baseline.       PAST MEDICAL HISTORY :  He  has a past medical history of COPD (chronic obstructive pulmonary disease) ([place]), Dyspnea, PAD (peripheral artery disease) ([place]), Stomach cancer ([place]), Tobacco abuse, and Weight loss.  PAST SURGICAL HISTORY: He  has a past surgical history that includes ABDOMINAL AORTOGRAM W/LOWER EXTREMITY (N/A, 05/03/2017);  PERIPHERAL VASCULAR INTERVENTION (Left, 05/03/2017); Femoral-femoral Bypass Graft (Bilateral, 05/08/2017); Esophagogastroduodenoscopy; Esophagogastroduodenoscopy (egd) with propofol (N/A, 05/24/2017); EUS (N/A, 06/07/2017); IR US Guide Vasc Access Right (07/05/2017); IR FLUORO GUIDE PORT INSERTION RIGHT (07/05/2017); transthoracic echocardiogram (06/26/2017); NM MYOVIEW LTD (06/2017); laparoscopy (N/A, 11/27/2017); Laparoscopic partial gastrectomy (N/A, 11/27/2017); and Laparoscopic insertion gastrostomy tube (N/A, 11/27/2017).  No Known Allergies  No current facility-administered medications on file prior to encounter.    Current Outpatient Medications on File Prior to Encounter  Medication Sig  . ipratropium-albuterol (DUONEB) 0.5-2.5 (3) MG/3ML SOLN Take 3 mLs by nebulization 4 (four) times daily.  . predniSONE (DELTASONE) 10 MG tablet Take 1 tablet (10 mg total) by mouth daily with breakfast. (Patient taking differently: Take 20 mg by mouth daily with breakfast. )  . Albuterol Sulfate (PROAIR RESPICLICK) 628 (90 Base) MCG/ACT AEPB Inhale 2 puffs into the lungs every 6 (six) hours as needed. (Patient not taking: Reported on 11/19/2017)  . atorvastatin (LIPITOR) 10 MG tablet Take 1 tablet (10 mg total) by mouth daily. (Patient not taking: Reported on 11/19/2017)  . budesonide-formoterol (SYMBICORT) 160-4.5 MCG/ACT inhaler Inhale 2 puffs into the lungs 2 (two) times daily. (Patient not taking: Reported on 11/19/2017)  . clopidogrel (PLAVIX) 75 MG tablet Take 1 tablet (75 mg total) by mouth daily. (Patient not taking: Reported on 11/19/2017)  . dronabinol (MARINOL) 2.5 MG capsule Take 1 capsule (2.5 mg total) by mouth 2 (two) times daily before a meal. (Patient not taking: Reported on 11/19/2017)  . lidocaine-prilocaine (EMLA)  cream Apply to affected area once (Patient not taking: Reported on 11/19/2017)  . ondansetron (ZOFRAN) 8 MG tablet Take 1 tablet (8 mg total) by mouth 2 (two) times daily as needed for  refractory nausea / vomiting. Start on day 3 after chemotherapy. (Patient not taking: Reported on 11/19/2017)  . oxyCODONE-acetaminophen (ROXICET) 5-325 MG tablet Take 1 tablet by mouth every 6 (six) hours as needed for severe pain. (Patient not taking: Reported on 11/19/2017)  . prochlorperazine (COMPAZINE) 10 MG tablet Take 1 tablet (10 mg total) by mouth every 6 (six) hours as needed (Nausea or vomiting). (Patient not taking: Reported on 11/19/2017)  . Tiotropium Bromide Monohydrate (SPIRIVA RESPIMAT) 2.5 MCG/ACT AERS Inhale 2 puffs into the lungs daily. (Patient not taking: Reported on 11/19/2017)    FAMILY HISTORY:  His indicated that his mother is deceased. He indicated that his father is deceased.   SOCIAL HISTORY: He  reports that he has been smoking cigarettes.  He has a 25.00 pack-year smoking history. He has never used smokeless tobacco. He reports that he does not drink alcohol or use drugs.  REVIEW OF SYSTEMS:    SUBJECTIVE:  As above  VITAL SIGNS: BP (!) 146/106   Pulse (!) 114   Temp 98.4 F (36.9 C) (Oral)   Resp (!) 23   Ht 5\' 5"  (1.651 m)   Wt 141 lb 15.6 oz (64.4 kg)   SpO2 96%   BMI 23.63 kg/m   HEMODYNAMICS:    VENTILATOR SETTINGS:    INTAKE / OUTPUT: I/O last 3 completed shifts: In: 4872.6 [I.V.:3600; NG/GT:191.2; IV Piggyback:1081.5] Out: 1610 [RUEAV:4098; Emesis/NG output:475]  PHYSICAL EXAMINATION: General: Cachectic male sitting up in a chair, somewhat anxious but in no overt distress  Neuro: Alert and appropriately interactive  Cardiovascular: S1 and S2 are regular and somewhat distant without murmur rub or gallop . Lungs: Abrasions are unlabored, there is fair air movement throughout, no wheezes scattered wheezes Abdomen: The abdomen is slightly distended without any unusual tenderness or rebound.  Marland Kitchen   LABS:  BMET Recent Labs  Lab 11/27/17 1251 11/28/17 0233 11/29/17 0446  NA  --  139 140  K  --  4.5 4.4  CL  --  105 107  CO2  --  28  26  BUN  --  7 7  CREATININE 0.79 0.61 0.52*  GLUCOSE  --  155* 104*    Electrolytes Recent Labs  Lab 11/28/17 0233 11/29/17 0446  CALCIUM 7.7* 8.5*  MG 1.8 1.9  PHOS 2.1* 2.1*    CBC Recent Labs  Lab 11/28/17 0233 11/28/17 1349 11/29/17 0446  WBC 11.9* 11.6* 13.1*  HGB 10.8* 10.0* 11.0*  HCT 34.0* 31.1* 34.5*  PLT 178 156 167    Coag's Recent Labs  Lab 11/29/17 0446  INR 0.99    Sepsis Markers No results for input(s): LATICACIDVEN, PROCALCITON, O2SATVEN in the last 168 hours.  ABG Recent Labs  Lab 11/27/17 0622  PHART 7.415  PCO2ART 44.1  PO2ART 79.6*    Liver Enzymes No results for input(s): AST, ALT, ALKPHOS, BILITOT, ALBUMIN in the last 168 hours.  Cardiac Enzymes No results for input(s): TROPONINI, PROBNP in the last 168 hours.  Glucose Recent Labs  Lab 11/28/17 1236 11/28/17 1629 11/28/17 2017 11/29/17 0005 11/29/17 0357  GLUCAP 145* 118* 121* 147* 115*    Imaging No results found.   STUDIES:    DISCUSSION:      This is a 62 year old with severe peripheral vascular disease and  severe COPD who is day 1 status post partial gastric resection for gastric cancer.  Postoperative course has been remarkable for transient hypotension  ASSESSMENT / PLAN:  PULMONARY A: Respiratory status appears to be at baseline.  I am continuing his DuoNeb's, beclomethasone and Brovana for now.  He is alert and cooperative and incentive spirometry has been ordered.  Pain control may be a little suboptimal and he is refusing opiates this morning, I have ordered a single dose of Toradol to see if this will help with his discomfort.  He continues with his epidural.    CARDIOVASCULAR A: He has been hemodynamically stable overnight without a drop in hemoglobin.  He is intermittently tachycardic but this appears to be related to movement and discomfort. He was on a statin and Plavix for his peripheral vascular disease prior to admission.   Lars Masson,  MD Critical Care Medicine [facility] Pager: [phone]  11/29/2017, 8:36 AM

## 2017-11-29 NOTE — Progress Notes (Signed)
2 Days Post-Op   Subjective/Chief Complaint: Minimal change, but hypotension resolved.  C/o some abdominal pain with tube feeds.  No flatus yet.     Objective: Vital signs in last 24 hours: Temp:  [98 F (36.7 C)-98.5 F (36.9 C)] 98.4 F (36.9 C) (05/23 0400) Pulse Rate:  [100-142] 114 (05/23 0700) Resp:  [18-30] 23 (05/23 0700) BP: (121-173)/(66-106) 146/106 (05/23 0700) SpO2:  [91 %-98 %] 96 % (05/23 0815) Weight:  [64.4 kg (141 lb 15.6 oz)] 64.4 kg (141 lb 15.6 oz) (05/23 0500) Last BM Date: (PTA)  Intake/Output from previous day: 05/22 0701 - 05/23 0700 In: 3147.8 [I.V.:2400; NG/GT:191.2; IV Piggyback:556.7] Out: 4975 [Urine:4575; Emesis/NG output:400] Intake/Output this shift: No intake/output data recorded.  General appearance: alert, cooperative and no distress Resp: breathing comfortably GI: soft, non distended, dressing c/d/i. J tube in place. Extremities: extremities normal, atraumatic, no cyanosis or edema  Lab Results:  Recent Labs    11/28/17 1349 11/29/17 0446  WBC 11.6* 13.1*  HGB 10.0* 11.0*  HCT 31.1* 34.5*  PLT 156 167   BMET Recent Labs    11/28/17 0233 11/29/17 0446  NA 139 140  K 4.5 4.4  CL 105 107  CO2 28 26  GLUCOSE 155* 104*  BUN 7 7  CREATININE 0.61 0.52*  CALCIUM 7.7* 8.5*   PT/INR Recent Labs    11/29/17 0446  LABPROT 12.9  INR 0.99   ABG Recent Labs    11/27/17 0622  PHART 7.415  HCO3 27.8    Studies/Results: No results found.  Anti-infectives: Anti-infectives (From admission, onward)   Start     Dose/Rate Route Frequency Ordered Stop   11/27/17 1600  ceFAZolin (ANCEF) IVPB 2g/100 mL premix     2 g 200 mL/hr over 30 Minutes Intravenous Every 8 hours 11/27/17 1237 11/27/17 1554   11/27/17 0600  ceFAZolin (ANCEF) IVPB 2g/100 mL premix     2 g 200 mL/hr over 30 Minutes Intravenous On call to O.R. 11/27/17 0541 11/27/17 0750      Assessment/Plan: s/p Procedure(s): LAPAROSCOPY DIAGNOSTIC (N/A) PARTIAL  GASTRECTOMY (N/A) JEJUNOSTOMY TUBE PLACEMENT (N/A) continue foley for urinary output monitoring, hypotension, epidural  Nutrition - stay on trophic feeds due to abdominal discomfort and no flatus yet.   D/c NGT, sips of clears.   Ambulate, pulmonary toilet Appreciate PCCM for severe COPD management.  Looks good today.   Stepdown orders.   CAD - no changes in cardiac monitoring tracings Add oral tylenol and tramadol for pain.    Gastric cancer, await pathology   LOS: 2 days    [name] 11/29/2017

## 2017-11-29 NOTE — Plan of Care (Signed)
Patient's NG tube was removed this morning.  Tolerating clear liquids well without nausea.  Patient ambulated in the hall 2 times with no complications. [place], [place]

## 2017-11-30 LAB — CBC
HEMATOCRIT: 35 % — AB (ref 39.0–52.0)
HEMOGLOBIN: 11.4 g/dL — AB (ref 13.0–17.0)
MCH: 27.5 pg (ref 26.0–34.0)
MCHC: 32.6 g/dL (ref 30.0–36.0)
MCV: 84.5 fL (ref 78.0–100.0)
Platelets: 220 10*3/uL (ref 150–400)
RBC: 4.14 MIL/uL — AB (ref 4.22–5.81)
RDW: 14.8 % (ref 11.5–15.5)
WBC: 13.6 10*3/uL — AB (ref 4.0–10.5)

## 2017-11-30 LAB — GLUCOSE, CAPILLARY
GLUCOSE-CAPILLARY: 111 mg/dL — AB (ref 65–99)
Glucose-Capillary: 106 mg/dL — ABNORMAL HIGH (ref 65–99)
Glucose-Capillary: 106 mg/dL — ABNORMAL HIGH (ref 65–99)
Glucose-Capillary: 181 mg/dL — ABNORMAL HIGH (ref 65–99)
Glucose-Capillary: 187 mg/dL — ABNORMAL HIGH (ref 65–99)

## 2017-11-30 LAB — BASIC METABOLIC PANEL
ANION GAP: 7 (ref 5–15)
BUN: 7 mg/dL (ref 6–20)
CALCIUM: 8.7 mg/dL — AB (ref 8.9–10.3)
CHLORIDE: 104 mmol/L (ref 101–111)
CO2: 27 mmol/L (ref 22–32)
Creatinine, Ser: 0.47 mg/dL — ABNORMAL LOW (ref 0.61–1.24)
GFR calc non Af Amer: >60 mL/min (ref >60)
Glucose, Bld: 101 mg/dL — ABNORMAL HIGH (ref 65–99)
POTASSIUM: 4.1 mmol/L (ref 3.5–5.1)
Sodium: 138 mmol/L (ref 135–145)

## 2017-11-30 LAB — MAGNESIUM
MAGNESIUM: 2 mg/dL (ref 1.7–2.4)
Magnesium: 1.8 mg/dL (ref 1.7–2.4)

## 2017-11-30 LAB — PHOSPHORUS
PHOSPHORUS: 2.9 mg/dL (ref 2.5–4.6)
Phosphorus: 3.3 mg/dL (ref 2.5–4.6)

## 2017-11-30 MED ORDER — ENSURE ENLIVE PO LIQD
237.0000 mL | Freq: Two times a day (BID) | ORAL | Status: DC
  Administered 2017-12-01 – 2017-12-04 (×3): 237 mL via ORAL

## 2017-11-30 MED ORDER — OSMOLITE 1.2 CAL PO LIQD
1000.0000 mL | ORAL | Status: DC
  Administered 2017-11-30 – 2017-12-04 (×5): 1000 mL
  Filled 2017-11-30 (×9): qty 1000

## 2017-11-30 MED ORDER — OSMOLITE 1.2 CAL PO LIQD
1000.0000 mL | ORAL | Status: DC
  Filled 2017-11-30 (×2): qty 1000

## 2017-11-30 MED ORDER — METHOCARBAMOL 500 MG PO TABS: 500.0000 mg | ORAL_TABLET | Freq: Three times a day (TID) | ORAL | Status: DC | PRN

## 2017-11-30 MED ORDER — OSMOLITE 1.2 CAL PO LIQD
1000.0000 mL | ORAL | Status: DC
  Filled 2017-11-30 (×3): qty 1000

## 2017-11-30 MED ORDER — PANTOPRAZOLE SODIUM 40 MG PO TBEC
40.0000 mg | DELAYED_RELEASE_TABLET | Freq: Every day | ORAL | Status: DC
  Administered 2017-11-30 – 2017-12-05 (×6): 40 mg via ORAL
  Filled 2017-11-30 (×5): qty 1

## 2017-11-30 NOTE — Progress Notes (Signed)
3 Days Post-Op   Subjective/Chief Complaint: Doing well.  Passing gas  Objective: Vital signs in last 24 hours: Temp:  [98 F (36.7 C)-100.1 F (37.8 C)] 99.4 F (37.4 C) (05/24 0400) Pulse Rate:  [101-124] 104 (05/24 0700) Resp:  [20-37] 25 (05/24 0700) BP: (92-143)/(63-92) 122/86 (05/24 0700) SpO2:  [92 %-98 %] 97 % (05/24 0700) Weight:  [59.5 kg (131 lb 2.8 oz)] 59.5 kg (131 lb 2.8 oz) (05/24 0500) Last BM Date: (PTA)  Intake/Output from previous day: 05/23 0701 - 05/24 0700 In: 2817.5 [P.O.:800; I.V.:1387.5; NG/GT:240; IV Piggyback:150] Out: 3216 [Urine:3036; Emesis/NG output:180] Intake/Output this shift: No intake/output data recorded.  General appearance: alert, cooperative and no distress Resp: breathing comfortably GI: soft, non distended, dressing c/d/i. J tube in place. Extremities: extremities normal, atraumatic, no cyanosis or edema  Lab Results:  Recent Labs    11/29/17 0446 11/30/17 0303  WBC 13.1* 13.6*  HGB 11.0* 11.4*  HCT 34.5* 35.0*  PLT 167 220   BMET Recent Labs    11/29/17 0446 11/30/17 0303  NA 140 138  K 4.4 4.1  CL 107 104  CO2 26 27  GLUCOSE 104* 101*  BUN 7 7  CREATININE 0.52* 0.47*  CALCIUM 8.5* 8.7*   PT/INR Recent Labs    11/29/17 0446  LABPROT 12.9  INR 0.99   ABG No results for input(s): PHART, HCO3 in the last 72 hours.  Invalid input(s): PCO2, PO2  Studies/Results: No results found.  Anti-infectives: Anti-infectives (From admission, onward)   Start     Dose/Rate Route Frequency Ordered Stop   11/27/17 1600  ceFAZolin (ANCEF) IVPB 2g/100 mL premix     2 g 200 mL/hr over 30 Minutes Intravenous Every 8 hours 11/27/17 1237 11/27/17 1554   11/27/17 0600  ceFAZolin (ANCEF) IVPB 2g/100 mL premix     2 g 200 mL/hr over 30 Minutes Intravenous On call to O.R. 11/27/17 0541 11/27/17 0750      Assessment/Plan: s/p Procedure(s): LAPAROSCOPY DIAGNOSTIC (N/A) PARTIAL GASTRECTOMY (N/A) JEJUNOSTOMY TUBE PLACEMENT  (N/A) Continue foley for epidural and urinary output monitoring.  Plan d/c epidural tomorrow  Nutrition - fulls this am, soft for dinner if no n/v.     Ambulate, pulmonary toilet Appreciate PCCM for severe COPD management.  Looks good today.   Stepdown orders.   CAD - no changes in cardiac monitoring tracings Add oral tylenol and tramadol for pain.   Add back some oral meds.  Gastric cancer, [id number]   LOS: 3 days    Bobby Caldwell 11/30/2017

## 2017-11-30 NOTE — Progress Notes (Signed)
Nutrition Follow-up  DOCUMENTATION CODES:   Non-severe (moderate) malnutrition in context of chronic illness  INTERVENTION:   TF via J-tube Osmolite 1.2 @ 20 ml/hr to advance by 10 ml every 8 hours to goal rate of 65 ml/hr  At goal provides:  1872 kcal, 86 grams protein, and 1265 ml free water.   Ensure Enlive po BID, each supplement provides 350 kcal and 20 grams of protein   NUTRITION DIAGNOSIS:   Moderate Malnutrition related to chronic illness(gastric cancer) as evidenced by moderate fat depletion, moderate muscle depletion. Ongoing.   GOAL:   Patient will meet greater than or equal to 90% of their needs Progressing.   MONITOR:   PO intake, Supplement acceptance, TF tolerance  ASSESSMENT:   Pt with PMH of severe COPD has smoked since age 81 and severe PVD admitted for gastric ca s/p partial gastrectomy with J tube placement 5/21.    Pt discussed during ICU rounds and with RN.  Pt feeling well today. No further N/V with PO or TF. Pt reports tolerating his Breakfast well and is excited about getting solid food for dinner tonight. Pt would like to start drinking his ensure again.   Medications reviewed and include: 20 mmol Kphos x 1 Labs reviewed: PO4 WNL   NUTRITION - FOCUSED PHYSICAL EXAM:    Most Recent Value  Orbital Region  Moderate depletion  Upper Arm Region  Moderate depletion  Thoracic and Lumbar Region  Moderate depletion  Buccal Region  Moderate depletion  Temple Region  Moderate depletion  Clavicle Bone Region  Moderate depletion  Clavicle and Acromion Bone Region  Moderate depletion  Scapular Bone Region  Unable to assess  Dorsal Hand  Moderate depletion  Patellar Region  Moderate depletion  Anterior Thigh Region  Mild depletion  Posterior Calf Region  Mild depletion  Edema (RD Assessment)  None  Hair  Reviewed  Eyes  Reviewed  Mouth  Reviewed  Skin  Reviewed  Nails  Reviewed       Diet Order:   Diet Order           DIET SOFT Room  service appropriate? Yes; Fluid consistency: Thin  Diet effective 1400          EDUCATION NEEDS:   Education needs have been addressed  Skin:  Skin Assessment: Reviewed RN Assessment  Last BM:  unknown  Height:   Ht Readings from Last 1 Encounters:  11/27/17 5\' 5"  (1.651 m)    Weight:   Wt Readings from Last 1 Encounters:  11/30/17 131 lb 2.8 oz (59.5 kg)    Ideal Body Weight:  61.8 kg  BMI:  Body mass index is 21.83 kg/m.  Estimated Nutritional Needs:   Kcal:  1700-2000  Protein:  85-100 grams  Fluid:  > 1.7 L/day  Maylon Peppers RD, LDN, CNSC [phone] Pager [phone] After Hours Pager

## 2017-11-30 NOTE — Progress Notes (Signed)
PULMONARY / CRITICAL CARE MEDICINE   Name: Bobby Caldwell MRN: [id number] DOB: 1955/12/10    ADMISSION DATE:  11/27/2017 CONSULTATION DATE:  11/27/2017  REFERRING MD:  [name]  CHIEF COMPLAINT:  Severe COPD day 1 S/P partial gastric resection for CA  HISTORY OF PRESENT ILLNESS:     This is a 62 year old with severe COPD who we are asked to see perioperatively because of his underlying COPD.  He has an essentially lifelong smoking history having started at age 40 with a 70-80-pack-year history.  He was seen by Dr. Lenna Gilford earlier this month at which time spirometry showed gold stage III disease.  A CTA was subsequently performed which shows bullous changes and of note  bilateral lower lobe densities.  Severe peripheral vascular disease for which he normally takes Plavix and a statin.  Preoperative clearance included a radionuclide stress test on 06/29/2017 which showed no evidence of reversible ischemia.       Partial gastrectomy for gastric cancer was accomplished 5/21.  EBL was estimated at only 100 cc.  He was transferred to the intensive care unit with an epidural in place however shortly after arrival he became hypotensive and the epidural was temporarily discontinued and he received a 1.5 L crystalloid bolus.  He received another liter of crystalloid in the early hours of this morning. Hgb is down 4 grams from pre-op. NG output has not been impressive.      He is now taking clear liquids and tolerating them without difficulty.  He is passing gas.  He reports very adequate pain control.  He is not having any difficulties with dyspnea.         PAST MEDICAL HISTORY :  He  has a past medical history of COPD (chronic obstructive pulmonary disease) ([place]), Dyspnea, PAD (peripheral artery disease) ([place]), Stomach cancer ([place]), Tobacco abuse, and Weight loss.  PAST SURGICAL HISTORY: He  has a past surgical history that includes ABDOMINAL AORTOGRAM W/LOWER EXTREMITY (N/A, 05/03/2017); PERIPHERAL VASCULAR  INTERVENTION (Left, 05/03/2017); Femoral-femoral Bypass Graft (Bilateral, 05/08/2017); Esophagogastroduodenoscopy; Esophagogastroduodenoscopy (egd) with propofol (N/A, 05/24/2017); EUS (N/A, 06/07/2017); IR US Guide Vasc Access Right (07/05/2017); IR FLUORO GUIDE PORT INSERTION RIGHT (07/05/2017); transthoracic echocardiogram (06/26/2017); NM MYOVIEW LTD (06/2017); laparoscopy (N/A, 11/27/2017); Laparoscopic partial gastrectomy (N/A, 11/27/2017); and Laparoscopic insertion gastrostomy tube (N/A, 11/27/2017).  No Known Allergies  No current facility-administered medications on file prior to encounter.    Current Outpatient Medications on File Prior to Encounter  Medication Sig  . ipratropium-albuterol (DUONEB) 0.5-2.5 (3) MG/3ML SOLN Take 3 mLs by nebulization 4 (four) times daily.  . predniSONE (DELTASONE) 10 MG tablet Take 1 tablet (10 mg total) by mouth daily with breakfast. (Patient taking differently: Take 20 mg by mouth daily with breakfast. )  . Albuterol Sulfate (PROAIR RESPICLICK) 557 (90 Base) MCG/ACT AEPB Inhale 2 puffs into the lungs every 6 (six) hours as needed. (Patient not taking: Reported on 11/19/2017)  . atorvastatin (LIPITOR) 10 MG tablet Take 1 tablet (10 mg total) by mouth daily. (Patient not taking: Reported on 11/19/2017)  . budesonide-formoterol (SYMBICORT) 160-4.5 MCG/ACT inhaler Inhale 2 puffs into the lungs 2 (two) times daily. (Patient not taking: Reported on 11/19/2017)  . clopidogrel (PLAVIX) 75 MG tablet Take 1 tablet (75 mg total) by mouth daily. (Patient not taking: Reported on 11/19/2017)  . dronabinol (MARINOL) 2.5 MG capsule Take 1 capsule (2.5 mg total) by mouth 2 (two) times daily before a meal. (Patient not taking: Reported on 11/19/2017)  . lidocaine-prilocaine (EMLA) cream Apply  to affected area once (Patient not taking: Reported on 11/19/2017)  . ondansetron (ZOFRAN) 8 MG tablet Take 1 tablet (8 mg total) by mouth 2 (two) times daily as needed for refractory nausea /  vomiting. Start on day 3 after chemotherapy. (Patient not taking: Reported on 11/19/2017)  . oxyCODONE-acetaminophen (ROXICET) 5-325 MG tablet Take 1 tablet by mouth every 6 (six) hours as needed for severe pain. (Patient not taking: Reported on 11/19/2017)  . prochlorperazine (COMPAZINE) 10 MG tablet Take 1 tablet (10 mg total) by mouth every 6 (six) hours as needed (Nausea or vomiting). (Patient not taking: Reported on 11/19/2017)  . Tiotropium Bromide Monohydrate (SPIRIVA RESPIMAT) 2.5 MCG/ACT AERS Inhale 2 puffs into the lungs daily. (Patient not taking: Reported on 11/19/2017)    FAMILY HISTORY:  His indicated that his mother is deceased. He indicated that his father is deceased.   SOCIAL HISTORY: He  reports that he has been smoking cigarettes.  He has a 25.00 pack-year smoking history. He has never used smokeless tobacco. He reports that he does not drink alcohol or use drugs.  REVIEW OF SYSTEMS:    SUBJECTIVE:  As above  VITAL SIGNS: BP 122/86   Pulse (!) 104   Temp 99.4 F (37.4 C) (Oral)   Resp (!) 25   Ht 5\' 5"  (1.651 m)   Wt 131 lb 2.8 oz (59.5 kg)   SpO2 97%   BMI 21.83 kg/m   HEMODYNAMICS:    VENTILATOR SETTINGS:    INTAKE / OUTPUT: I/O last 3 completed shifts: In: 4536.1 [P.O.:800; I.V.:2587.5; Other:567.4; NG/GT:431.2; IV Piggyback:150] Out: 2202 [Urine:5561; Emesis/NG output:580]  PHYSICAL EXAMINATION: General: Sitting up in bed taking clear liquids and in no distress   Neuro: Very alert and appropriate   Cardiovascular: S1 and S2 are regular and distant without murmur rub or gallop.  He has no JVD.   Lungs: Respirations are unlabored there is symmetric air movement with good air movement throughout and no wheezes at present  Abdomen: Abdomen is flat without any unusual tenderness.     LABS:  BMET Recent Labs  Lab 11/28/17 0233 11/29/17 0446 11/30/17 0303  NA 139 140 138  K 4.5 4.4 4.1  CL 105 107 104  CO2 28 26 27   BUN 7 7 7   CREATININE 0.61  0.52* 0.47*  GLUCOSE 155* 104* 101*    Electrolytes Recent Labs  Lab 11/28/17 0233 11/29/17 0446 11/29/17 1821 11/30/17 0303  CALCIUM 7.7* 8.5*  --  8.7*  MG 1.8 1.9 1.9 1.8  PHOS 2.1* 2.1* 1.9* 2.9    CBC Recent Labs  Lab 11/28/17 1349 11/29/17 0446 11/30/17 0303  WBC 11.6* 13.1* 13.6*  HGB 10.0* 11.0* 11.4*  HCT 31.1* 34.5* 35.0*  PLT 156 167 220    Coag's Recent Labs  Lab 11/29/17 0446  INR 0.99    Sepsis Markers No results for input(s): LATICACIDVEN, PROCALCITON, O2SATVEN in the last 168 hours.  ABG Recent Labs  Lab 11/27/17 0622  PHART 7.415  PCO2ART 44.1  PO2ART 79.6*    Liver Enzymes No results for input(s): AST, ALT, ALKPHOS, BILITOT, ALBUMIN in the last 168 hours.  Cardiac Enzymes No results for input(s): TROPONINI, PROBNP in the last 168 hours.  Glucose Recent Labs  Lab 11/29/17 0842 11/29/17 1232 11/29/17 1606 11/29/17 2035 11/30/17 0028 11/30/17 0504  GLUCAP 101* 99 98 98 106* 111*    Imaging No results found.   STUDIES:    DISCUSSION:      This is  a 62 year old with severe peripheral vascular disease and severe COPD who is day 3 status post partial gastric resection for gastric cancer.  Postoperative course has been remarkable for transient hypotension  ASSESSMENT / PLAN:  PULMONARY A: Respiratory status remains at baseline.  We are anticipating removal of his epidural tomorrow and he may require some adjustments in pain management to maintain pulmonary toilet.  For now he continues on Brovana DuoNeb's and Pulmicort.      CARDIOVASCULAR A: He has been hemodynamically stable overnight without a drop in hemoglobin.  He is intermittently tachycardic but this appears to be related to movement and discomfort. He was on a statin and Plavix for his peripheral vascular disease prior to admission.   Lars Masson, MD Critical Care Medicine [facility] Pager: [phone]  11/30/2017, 9:17 AM

## 2017-11-30 NOTE — Anesthesia Post-op Follow-up Note (Signed)
  Anesthesia Pain Follow-up Note  Patient: Bobby Caldwell  Day #: 3  Date of Follow-up: 11/30/2017 Time: 10:08 AM  Last Vitals:  Vitals:   11/30/17 0929 11/30/17 0931  BP:    Pulse:    Resp:    Temp:    SpO2: 96% 96%    Level of Consciousness: alert  Pain: mild   Side Effects:None  Catheter Site Exam: Clean, dry, intact. No tenderness to palpation.  Epidural / Intrathecal (From admission, onward)   Start     Dose/Rate Route Frequency Ordered Stop   11/27/17 1100  ropivacaine (PF) 2 mg/mL (0.2%) (NAROPIN) injection     10 mL/hr 10 mL/hr  Epidural Continuous 11/27/17 1048         Plan: Continue current therapy of postop epidural at surgeon's request. Patient starting PO intake, plan to d/c epidural tomorrow per surgeon's note.  Audry Pili

## 2017-12-01 LAB — CBC
HEMATOCRIT: 33 % — AB (ref 39.0–52.0)
Hemoglobin: 10.9 g/dL — ABNORMAL LOW (ref 13.0–17.0)
MCH: 27.7 pg (ref 26.0–34.0)
MCHC: 33 g/dL (ref 30.0–36.0)
MCV: 84 fL (ref 78.0–100.0)
PLATELETS: 211 10*3/uL (ref 150–400)
RBC: 3.93 MIL/uL — AB (ref 4.22–5.81)
RDW: 14.6 % (ref 11.5–15.5)
WBC: 11.2 10*3/uL — AB (ref 4.0–10.5)

## 2017-12-01 LAB — BASIC METABOLIC PANEL
ANION GAP: 8 (ref 5–15)
BUN: 12 mg/dL (ref 6–20)
CO2: 27 mmol/L (ref 22–32)
Calcium: 8.4 mg/dL — ABNORMAL LOW (ref 8.9–10.3)
Chloride: 102 mmol/L (ref 101–111)
Creatinine, Ser: 0.48 mg/dL — ABNORMAL LOW (ref 0.61–1.24)
GLUCOSE: 137 mg/dL — AB (ref 65–99)
POTASSIUM: 3.9 mmol/L (ref 3.5–5.1)
Sodium: 137 mmol/L (ref 135–145)

## 2017-12-01 LAB — MAGNESIUM
Magnesium: 1.8 mg/dL (ref 1.7–2.4)
Magnesium: 1.8 mg/dL (ref 1.7–2.4)

## 2017-12-01 LAB — GLUCOSE, CAPILLARY
GLUCOSE-CAPILLARY: 132 mg/dL — AB (ref 65–99)
GLUCOSE-CAPILLARY: 138 mg/dL — AB (ref 65–99)
Glucose-Capillary: 164 mg/dL — ABNORMAL HIGH (ref 65–99)
Glucose-Capillary: 122 mg/dL — ABNORMAL HIGH (ref 65–99)
Glucose-Capillary: 161 mg/dL — ABNORMAL HIGH (ref 65–99)
Glucose-Capillary: 182 mg/dL — ABNORMAL HIGH (ref 65–99)

## 2017-12-01 LAB — PHOSPHORUS
PHOSPHORUS: 2.7 mg/dL (ref 2.5–4.6)
Phosphorus: 3 mg/dL (ref 2.5–4.6)

## 2017-12-01 NOTE — Progress Notes (Signed)
PULMONARY / CRITICAL CARE MEDICINE   Name: Bobby Caldwell MRN: [id number] DOB: 30-Sep-1955    ADMISSION DATE:  11/27/2017 CONSULTATION DATE:  11/27/2017  REFERRING MD:  [name]  CHIEF COMPLAINT:  Severe COPD day 2 S/P partial gastric resection for CA  HISTORY OF PRESENT ILLNESS:     This is a 62 year old with severe COPD who we are asked to see perioperatively because of his underlying COPD.  He has an essentially lifelong smoking history having started at age 57 with a 70-80-pack-year history.  He was seen by Dr. Lenna Gilford earlier this month at which time spirometry showed gold stage III disease.  A CTA was subsequently performed which shows bullous changes and of note  bilateral lower lobe densities.  Severe peripheral vascular disease for which he normally takes Plavix and a statin.  Preoperative clearance included a radionuclide stress test on 06/29/2017 which showed no evidence of reversible ischemia.       Partial gastrectomy for gastric cancer was accomplished 5/21.  EBL was estimated at only 100 cc.  He was transferred to the intensive care unit with an epidural in place however shortly after arrival he became hypotensive and the epidural was temporarily discontinued and he received a 1.5 L crystalloid bolus.  He received another liter of crystalloid in the early hours of this morning. Hgb is down 4 grams from pre-op. NG output has not been impressive. He is now taking clear liquids and tolerating them without difficulty.  He is passing gas.  He reports very adequate pain control.  He is not having any difficulties with dyspnea.        SUBJECTIVE:  Doing well. States he is able to cough and deep breath and his breathing is good. He watches his sats closely  VITAL SIGNS: BP 116/79 (BP Location: Left Arm)   Pulse (!) 109   Temp 98.4 F (36.9 C) (Oral)   Resp 20   Ht 5\' 5"  (1.651 m)   Wt 130 lb 1.1 oz (59 kg)   SpO2 99%   BMI 21.64 kg/m   HEMODYNAMICS:    VENTILATOR SETTINGS:     INTAKE / OUTPUT: I/O last 3 completed shifts: In: 3453.7 [P.O.:975; I.V.:915.3; Other:275; NG/GT:1238.3; IV Piggyback:50] Out: 1951 [Urine:1951]  PHYSICAL EXAMINATION: General: Resting in bed, awake and alert, appropriate, pleasant . States his pain control is ok Neuro: Very alert and appropriate , MAE x 4, A&O x 3  Cardiovascular: S1 and S2 , RRR no RMG  He has no JVD.   Lungs: Bilateral chest excursion, Respirations are regular and unlabored , breath sounds are clear throughout , slightly diminished per bases Abdomen: Abdomen is flat without any unusual tenderness.     LABS:  BMET Recent Labs  Lab 11/29/17 0446 11/30/17 0303 12/01/17 0634  NA 140 138 137  K 4.4 4.1 3.9  CL 107 104 102  CO2 26 27 27   BUN 7 7 12   CREATININE 0.52* 0.47* 0.48*  GLUCOSE 104* 101* 137*    Electrolytes Recent Labs  Lab 11/29/17 0446  11/30/17 0303 11/30/17 1651 12/01/17 0634  CALCIUM 8.5*  --  8.7*  --  8.4*  MG 1.9   < > 1.8 2.0 1.8  PHOS 2.1*   < > 2.9 3.3 3.0   < > = values in this interval not displayed.    CBC Recent Labs  Lab 11/29/17 0446 11/30/17 0303 12/01/17 0634  WBC 13.1* 13.6* 11.2*  HGB 11.0* 11.4* 10.9*  HCT 34.5* 35.0* 33.0*  PLT 167 220 211    Coag's Recent Labs  Lab 11/29/17 0446  INR 0.99    Sepsis Markers No results for input(s): LATICACIDVEN, PROCALCITON, O2SATVEN in the last 168 hours.  ABG Recent Labs  Lab 11/27/17 0622  PHART 7.415  PCO2ART 44.1  PO2ART 79.6*    Liver Enzymes No results for input(s): AST, ALT, ALKPHOS, BILITOT, ALBUMIN in the last 168 hours.  Cardiac Enzymes No results for input(s): TROPONINI, PROBNP in the last 168 hours.  Glucose Recent Labs  Lab 11/30/17 1603 11/30/17 2047 12/01/17 0036 12/01/17 0434 12/01/17 0758 12/01/17 1142  GLUCAP 106* 181* 138* 164* 132* 122*    Imaging No results found.   STUDIES:    DISCUSSION: This is a 62 year old with severe peripheral vascular disease and severe  COPD who is day 3 status post partial gastric resection for gastric cancer.  Postoperative course has been remarkable for transient hypotension  ASSESSMENT / PLAN:  PULMONARY A: Respiratory status remains at baseline.    Epidural removed 5/25. He states his pain management is good. Plan: CXR 5/26 Aggressive pulmonary toilet. Mobilize/ OOB     Continue scheduled  Brovana DuoNeb's and Pulmicort.  Maintain sats > 92%     CARDIOVASCULAR A: Hemodynamically stable overnight without a drop in hemoglobin.  Intermittently tachycardic but this appears to be related to movement and discomfort. He was on a statin and Plavix for his peripheral vascular disease prior to admission. Plan: Continue Lopressor prn  Resume statin/ plavix  per primary team as is clinically indicated  Pt. Appears to be doing well post op. We will see 5/26, if he continues to do well a full 24 hours after epidural d/c'd and pain managed, we will sign off   Magdalen Spatz, AGACNP-BC [place] Pager: [phone]  12/01/2017, 4:29 PM

## 2017-12-01 NOTE — Anesthesia Post-op Follow-up Note (Signed)
  Anesthesia Pain Follow-up Note  Patient: Bobby Caldwell  Day #: 5  Date of Follow-up: 12/01/2017 Time: 1:38 PM  Last Vitals:  Vitals:   12/01/17 1145 12/01/17 1252  BP: 116/79   Pulse: (!) 109   Resp: 20   Temp: 36.9 C   SpO2: 97% 99%    Level of Consciousness: alert  Pain: none   Side Effects:None  Catheter Site Exam:clean  Epidural / Intrathecal (From admission, onward)   Start     Dose/Rate Route Frequency Ordered Stop   11/27/17 1100  ropivacaine (PF) 2 mg/mL (0.2%) (NAROPIN) injection     10 mL/hr 10 mL/hr  Epidural Continuous 11/27/17 1048         Plan: Catheter removed/tip intact at surgeon's request  Srija Southard S

## 2017-12-01 NOTE — Addendum Note (Signed)
Addendum  created 12/01/17 1339 by Myrtie Soman, MD   Sign clinical note

## 2017-12-01 NOTE — Progress Notes (Signed)
Patient appears emaciated.  States "I just don't feel hungry".  Despite offered several different choices for meal exchanges, patient has refused all.  Osmolyte 1.2 continues to infuse at 65 ml/hr.  Tolerating TF well.

## 2017-12-01 NOTE — Progress Notes (Signed)
4 Days Post-Op   Subjective/Chief Complaint: Pt doing well this AM. Pt states tol PO and TFs.  No nausea this AM    Objective: Vital signs in last 24 hours: Temp:  [98 F (36.7 C)-98.8 F (37.1 C)] 98.5 F (36.9 C) (05/25 0755) Pulse Rate:  [111-126] 112 (05/25 0755) Resp:  [17-30] 22 (05/25 0755) BP: (105-143)/(74-90) 114/78 (05/25 0755) SpO2:  [93 %-98 %] 96 % (05/25 0945) Weight:  [59 kg (130 lb 1.1 oz)] 59 kg (130 lb 1.1 oz) (05/25 0700) Last BM Date: (PTA)  Intake/Output from previous day: 05/24 0701 - 05/25 0700 In: 2413.7 [P.O.:975; I.V.:295.3; NG/GT:988.3] Out: 750 [Urine:750] Intake/Output this shift: Total I/O In: -  Out: 1000 [Urine:1000]  General appearance: alert and cooperative GI: soft, non-tender; bowel sounds normal; no masses,  no organomegaly and Jtube in place, midline wound dressing intact  Lab Results:  Recent Labs    11/30/17 0303 12/01/17 0634  WBC 13.6* 11.2*  HGB 11.4* 10.9*  HCT 35.0* 33.0*  PLT 220 211   BMET Recent Labs    11/30/17 0303 12/01/17 0634  NA 138 137  K 4.1 3.9  CL 104 102  CO2 27 27  GLUCOSE 101* 137*  BUN 7 12  CREATININE 0.47* 0.48*  CALCIUM 8.7* 8.4*   PT/INR Recent Labs    11/29/17 0446  LABPROT 12.9  INR 0.99   Anti-infectives: Anti-infectives (From admission, onward)   Start     Dose/Rate Route Frequency Ordered Stop   11/27/17 1600  ceFAZolin (ANCEF) IVPB 2g/100 mL premix     2 g 200 mL/hr over 30 Minutes Intravenous Every 8 hours 11/27/17 1237 11/27/17 1554   11/27/17 0600  ceFAZolin (ANCEF) IVPB 2g/100 mL premix     2 g 200 mL/hr over 30 Minutes Intravenous On call to O.R. 11/27/17 0541 11/27/17 0750      Assessment/Plan: s/p Procedure(s): LAPAROSCOPY DIAGNOSTIC (N/A) PARTIAL GASTRECTOMY (N/A) JEJUNOSTOMY TUBE PLACEMENT (N/A) Cont' PO as he is tolerating well Con't TFs Mobilizing well D/w Dr. Kalman Shan of anesthesia to DC epidural and will be by to DC   LOS: 4 days    Rosario Jacks.,  [facility] 12/01/2017

## 2017-12-02 ENCOUNTER — Inpatient Hospital Stay (HOSPITAL_COMMUNITY): Payer: Medicaid Other

## 2017-12-02 LAB — CBC
HCT: 34.8 % — ABNORMAL LOW (ref 39.0–52.0)
HEMOGLOBIN: 11.3 g/dL — AB (ref 13.0–17.0)
MCH: 27.3 pg (ref 26.0–34.0)
MCHC: 32.5 g/dL (ref 30.0–36.0)
MCV: 84.1 fL (ref 78.0–100.0)
Platelets: 235 10*3/uL (ref 150–400)
RBC: 4.14 MIL/uL — ABNORMAL LOW (ref 4.22–5.81)
RDW: 14.2 % (ref 11.5–15.5)
WBC: 9.1 10*3/uL (ref 4.0–10.5)

## 2017-12-02 LAB — GLUCOSE, CAPILLARY
GLUCOSE-CAPILLARY: 140 mg/dL — AB (ref 65–99)
GLUCOSE-CAPILLARY: 155 mg/dL — AB (ref 65–99)
GLUCOSE-CAPILLARY: 165 mg/dL — AB (ref 65–99)
GLUCOSE-CAPILLARY: 181 mg/dL — AB (ref 65–99)
Glucose-Capillary: 168 mg/dL — ABNORMAL HIGH (ref 65–99)
Glucose-Capillary: 161 mg/dL — ABNORMAL HIGH (ref 65–99)

## 2017-12-02 LAB — BASIC METABOLIC PANEL
Anion gap: 8 (ref 5–15)
BUN: 12 mg/dL (ref 6–20)
CALCIUM: 8.3 mg/dL — AB (ref 8.9–10.3)
CO2: 30 mmol/L (ref 22–32)
Chloride: 96 mmol/L — ABNORMAL LOW (ref 101–111)
Creatinine, Ser: 0.4 mg/dL — ABNORMAL LOW (ref 0.61–1.24)
Glucose, Bld: 117 mg/dL — ABNORMAL HIGH (ref 65–99)
Potassium: 3.7 mmol/L (ref 3.5–5.1)
SODIUM: 134 mmol/L — AB (ref 135–145)

## 2017-12-02 MED ORDER — ENOXAPARIN SODIUM 40 MG/0.4ML ~~LOC~~ SOLN
40.0000 mg | Freq: Every day | SUBCUTANEOUS | Status: DC
  Administered 2017-12-02 – 2017-12-05 (×4): 40 mg via SUBCUTANEOUS
  Filled 2017-12-02 (×4): qty 0.4

## 2017-12-02 MED ORDER — POLYETHYLENE GLYCOL 3350 17 G PO PACK
17.0000 g | PACK | Freq: Once | ORAL | Status: AC
  Administered 2017-12-02: 17 g via ORAL
  Filled 2017-12-02: qty 1

## 2017-12-02 MED ORDER — ENOXAPARIN SODIUM 40 MG/0.4ML ~~LOC~~ SOLN: 40.0000 mg | Freq: Every day | SUBCUTANEOUS | Status: DC

## 2017-12-02 NOTE — Progress Notes (Addendum)
PULMONARY / CRITICAL CARE MEDICINE   Name: Bobby Caldwell MRN: [id number] DOB: [birth year]    ADMISSION DATE:  11/27/2017 CONSULTATION DATE:  11/27/2017  REFERRING MD:  [name]  CHIEF COMPLAINT:  Severe COPD day 2 S/P partial gastric resection for CA  HISTORY OF PRESENT ILLNESS:     This is a 62 year old with severe COPD who we are asked to see perioperatively because of his underlying COPD.  He has an essentially lifelong smoking history having started at age 89 with a 70-80-pack-year history.  He was seen by Dr. Lenna Gilford earlier this month at which time spirometry showed gold stage III disease.  A CTA was subsequently performed which shows bullous changes and of note  bilateral lower lobe densities.  Severe peripheral vascular disease for which he normally takes Plavix and a statin.  Preoperative clearance included a radionuclide stress test on 06/29/2017 which showed no evidence of reversible ischemia.       Partial gastrectomy for gastric cancer was accomplished 5/21.  EBL was estimated at only 100 cc.  He was transferred to the intensive care unit with an epidural in place however shortly after arrival he became hypotensive and the epidural was temporarily discontinued and he received a 1.5 L crystalloid bolus.  He received another liter of crystalloid in the early hours of this morning. Hgb is down 4 grams from pre-op. NG output has not been impressive. He is now taking clear liquids and tolerating them without difficulty.  He is passing gas.  He reports very adequate pain control.  He is not having any difficulties with dyspnea.        SUBJECTIVE:  OOB in chair on 3 L [place] ( Wears 2.5 at home). Saturation 93%. States his breathing is good. States he has no issues with pain impeding his ability to cough and deep breath.  VITAL SIGNS: BP 106/78 (BP Location: Right Arm)   Pulse (!) 112   Temp 98.2 F (36.8 C) (Oral)   Resp (!) 31   Ht 5\' 5"  (1.651 m)   Wt 131 lb 13.4 oz (59.8 kg)   SpO2 93%    BMI 21.94 kg/m   HEMODYNAMICS:    VENTILATOR SETTINGS:    INTAKE / OUTPUT: I/O last 3 completed shifts: In: 8527 [P.O.:900; I.V.:333.3; Other:95; NG/GT:2166.7] Out: 2875 [Urine:2875]  PHYSICAL EXAMINATION: General:OOB in chair, awake and alert, appropriate, in NAD Neuro:  MAE x 4, A&O x 3 , Alert and appropriate Cardiovascular: S1,  S2 , RRR  no RMG  He has no JVD.  NSR with rare PVC noted per tele Lungs: Bilateral excursion, RR 21, Breath sounds are clear bilaterally and diminished per bases Abdomen: Abdomen is flat without any unusual tenderness.     LABS:  BMET Recent Labs  Lab 11/30/17 0303 12/01/17 0634 12/02/17 0220  NA 138 137 134*  K 4.1 3.9 3.7  CL 104 102 96*  CO2 27 27 30   BUN 7 12 12   CREATININE 0.47* 0.48* 0.40*  GLUCOSE 101* 137* 117*    Electrolytes Recent Labs  Lab 11/30/17 0303 11/30/17 1651 12/01/17 0634 12/01/17 1659 12/02/17 0220  CALCIUM 8.7*  --  8.4*  --  8.3*  MG 1.8 2.0 1.8 1.8  --   PHOS 2.9 3.3 3.0 2.7  --     CBC Recent Labs  Lab 11/30/17 0303 12/01/17 0634 12/02/17 0220  WBC 13.6* 11.2* 9.1  HGB 11.4* 10.9* 11.3*  HCT 35.0* 33.0* 34.8*  PLT 220 211 235  Coag's Recent Labs  Lab 11/29/17 0446  INR 0.99    Sepsis Markers No results for input(s): LATICACIDVEN, PROCALCITON, O2SATVEN in the last 168 hours.  ABG Recent Labs  Lab 11/27/17 0622  PHART 7.415  PCO2ART 44.1  PO2ART 79.6*    Liver Enzymes No results for input(s): AST, ALT, ALKPHOS, BILITOT, ALBUMIN in the last 168 hours.  Cardiac Enzymes No results for input(s): TROPONINI, PROBNP in the last 168 hours.  Glucose Recent Labs  Lab 12/01/17 1732 12/01/17 2103 12/01/17 2348 12/02/17 0420 12/02/17 0713 12/02/17 1134  GLUCAP 161* 182* 155* 165* 168* 161*    Imaging Dg Chest Port 1 View  Result Date: 12/02/2017 CLINICAL DATA:  Respiratory failure EXAM: PORTABLE CHEST 1 VIEW COMPARISON:  09/11/2017 FINDINGS: Power injectable Port-A-Cath  tip: SVC. Emphysema noted. Atherosclerotic calcification of the aortic arch. Indistinct bandlike airspace opacities at both lung bases, similar to prior. Faint interstitial accentuation. IMPRESSION: 1. Bibasilar bandlike opacities probably from atelectasis. 2. Aortic Atherosclerosis (ICD10-I70.0) and Emphysema (ICD10-J43.9). Electronically Signed   By: Van Clines M.D.   On: 12/02/2017 09:03     STUDIES:    DISCUSSION: This is a 62 year old with severe peripheral vascular disease and severe COPD who is day 3 status post partial gastric resection for gastric cancer.  Postoperative course has been remarkable for transient hypotension  ASSESSMENT / PLAN:  PULMONARY A: Respiratory status remains at baseline.    Epidural removed 5/25. He states his pain management is good. Home oxygen baseline is 2.5 L [place] CXR 5/26>> Basilar opacities>> atelectasis Plan: CXR prn Aggressive pulmonary toilet. IS Q 1 while awake Mobilize/ OOB  Several times daily   Continue scheduled  Brovana DuoNeb's and Pulmicort.  Titrate oxygen to Maintain sats > 92%     CARDIOVASCULAR A: Hemodynamically stable overnight without a drop in hemoglobin.  Intermittently tachycardic but this appears to be related to movement and discomfort. He was on a statin and Plavix for his peripheral vascular disease prior to admission. Plan: Continue Lopressor prn  Resume statin/ plavix  per primary team as is clinically indicated  Pt. Appears to be doing well post op. Instructed on IS and encouraged to continue to practice aggressive pulmonary toilet. Pulmonary will sign off. Please re-consult if we can be of any further assistance.  Magdalen Spatz, AGACNP-BC Critical Care Medicine [facility] Pager: [phone]  12/02/2017, 12:49 PM

## 2017-12-02 NOTE — Progress Notes (Addendum)
5 Days Post-Op   Subjective/Chief Complaint: No c/o States pain ok after epidural Eats when he is hungry; sort of afraid to eat more - drinking coke, fruit cup, water Ambulating per nurses +flatus; no BM   Objective: Vital signs in last 24 hours: Temp:  [98.2 F (36.8 C)-99.7 F (37.6 C)] 98.2 F (36.8 C) (05/26 0700) Pulse Rate:  [107-112] 111 (05/26 0415) Resp:  [19-27] 19 (05/26 0415) BP: (114-134)/(66-84) 131/84 (05/26 0415) SpO2:  [94 %-99 %] 97 % (05/26 0415) Weight:  [59.8 kg (131 lb 13.4 oz)] 59.8 kg (131 lb 13.4 oz) (05/26 0700) Last BM Date: (PTA)  Intake/Output from previous day: 05/25 0701 - 05/26 0700 In: 2500 [P.O.:900; I.V.:213.3; NG/GT:1386.7] Out: 2875 [Urine:2875] Intake/Output this shift: No intake/output data recorded.  Alert, nad, nontoxic Mostly cta Mild tachy - stable Soft, mild bloating, J tube intact/secure; dressing ok +SCDs, no edema  Lab Results:  Recent Labs    12/01/17 0634 12/02/17 0220  WBC 11.2* 9.1  HGB 10.9* 11.3*  HCT 33.0* 34.8*  PLT 211 235   BMET Recent Labs    12/01/17 0634 12/02/17 0220  NA 137 134*  K 3.9 3.7  CL 102 96*  CO2 27 30  GLUCOSE 137* 117*  BUN 12 12  CREATININE 0.48* 0.40*  CALCIUM 8.4* 8.3*   PT/INR No results for input(s): LABPROT, INR in the last 72 hours. ABG No results for input(s): PHART, HCO3 in the last 72 hours.  Invalid input(s): PCO2, PO2  Studies/Results: No results found.  Anti-infectives: Anti-infectives (From admission, onward)   Start     Dose/Rate Route Frequency Ordered Stop   11/27/17 1600  ceFAZolin (ANCEF) IVPB 2g/100 mL premix     2 g 200 mL/hr over 30 Minutes Intravenous Every 8 hours 11/27/17 1237 11/27/17 1554   11/27/17 0600  ceFAZolin (ANCEF) IVPB 2g/100 mL premix     2 g 200 mL/hr over 30 Minutes Intravenous On call to O.R. 11/27/17 0541 11/27/17 0750      Assessment/Plan: s/p Procedure(s): LAPAROSCOPY DIAGNOSTIC (N/A) PARTIAL GASTRECTOMY  (N/A) JEJUNOSTOMY TUBE PLACEMENT (N/A) Cont' PO as he is tolerating well Con't TFs Mobilizing well Try miralax Add chemical vte prophylaxis prob dc in next 1-2 days  Leighton Ruff. Redmond Pulling, MD, FACS General, Bariatric, & Minimally Invasive Surgery [facility], [place]   LOS: 5 days    Greer Pickerel 12/02/2017

## 2017-12-02 NOTE — Progress Notes (Signed)
Patient tolerated sitting up in chair for approximately 4 hrs today without difficulty and then visitors for 2 hrs.  Patient then needing pain med and not feeling like ambulating.

## 2017-12-03 LAB — CBC
HEMATOCRIT: 37 % — AB (ref 39.0–52.0)
HEMOGLOBIN: 12 g/dL — AB (ref 13.0–17.0)
MCH: 27.4 pg (ref 26.0–34.0)
MCHC: 32.4 g/dL (ref 30.0–36.0)
MCV: 84.5 fL (ref 78.0–100.0)
Platelets: 279 10*3/uL (ref 150–400)
RBC: 4.38 MIL/uL (ref 4.22–5.81)
RDW: 14.1 % (ref 11.5–15.5)
WBC: 11.6 10*3/uL — ABNORMAL HIGH (ref 4.0–10.5)

## 2017-12-03 LAB — GLUCOSE, CAPILLARY
GLUCOSE-CAPILLARY: 144 mg/dL — AB (ref 65–99)
GLUCOSE-CAPILLARY: 162 mg/dL — AB (ref 65–99)
GLUCOSE-CAPILLARY: 166 mg/dL — AB (ref 65–99)
Glucose-Capillary: 158 mg/dL — ABNORMAL HIGH (ref 65–99)
Glucose-Capillary: 188 mg/dL — ABNORMAL HIGH (ref 65–99)
Glucose-Capillary: 212 mg/dL — ABNORMAL HIGH (ref 65–99)
Glucose-Capillary: 189 mg/dL — ABNORMAL HIGH (ref 65–99)

## 2017-12-03 MED ORDER — NICOTINE 21 MG/24HR TD PT24
21.0000 mg | MEDICATED_PATCH | Freq: Every day | TRANSDERMAL | Status: DC
  Administered 2017-12-03 – 2017-12-05 (×3): 21 mg via TRANSDERMAL
  Filled 2017-12-03 (×3): qty 1

## 2017-12-03 NOTE — Progress Notes (Signed)
6 Days Post-Op   Subjective/Chief Complaint: Pt with some nausea last night.  Tol TFs No BM since before surgery. Mobilizing    Objective: Vital signs in last 24 hours: Temp:  [98.1 F (36.7 C)-98.9 F (37.2 C)] 98.9 F (37.2 C) (05/27 0333) Pulse Rate:  [109-115] 109 (05/27 0400) Resp:  [18-31] 19 (05/27 0400) BP: (106-135)/(73-99) 135/87 (05/27 0400) SpO2:  [93 %-99 %] 94 % (05/27 0400) Weight:  [57.8 kg (127 lb 6.8 oz)] 57.8 kg (127 lb 6.8 oz) (05/27 0627) Last BM Date: 11/27/17  Intake/Output from previous day: 05/26 0701 - 05/27 0700 In: 1527.5 [P.O.:460; I.V.:142.3; NG/GT:925.2] Out: 2020 [Urine:2000; Emesis/NG output:20] Intake/Output this shift: No intake/output data recorded.  General appearance: alert and cooperative GI: soft, min dist, nttp, midline inc c/d/i, Jtube in place  Lab Results:  Recent Labs    12/02/17 0220 12/03/17 0231  WBC 9.1 11.6*  HGB 11.3* 12.0*  HCT 34.8* 37.0*  PLT 235 279   BMET Recent Labs    12/01/17 0634 12/02/17 0220  NA 137 134*  K 3.9 3.7  CL 102 96*  CO2 27 30  GLUCOSE 137* 117*  BUN 12 12  CREATININE 0.48* 0.40*  CALCIUM 8.4* 8.3*   PT/INR No results for input(s): LABPROT, INR in the last 72 hours. ABG No results for input(s): PHART, HCO3 in the last 72 hours.  Invalid input(s): PCO2, PO2  Studies/Results: Dg Chest Port 1 View  Result Date: 12/02/2017 CLINICAL DATA:  Respiratory failure EXAM: PORTABLE CHEST 1 VIEW COMPARISON:  09/11/2017 FINDINGS: Power injectable Port-A-Cath tip: SVC. Emphysema noted. Atherosclerotic calcification of the aortic arch. Indistinct bandlike airspace opacities at both lung bases, similar to prior. Faint interstitial accentuation. IMPRESSION: 1. Bibasilar bandlike opacities probably from atelectasis. 2. Aortic Atherosclerosis (ICD10-I70.0) and Emphysema (ICD10-J43.9). Electronically Signed   By: Van Clines M.D.   On: 12/02/2017 09:03    Anti-infectives: Anti-infectives  (From admission, onward)   Start     Dose/Rate Route Frequency Ordered Stop   11/27/17 1600  ceFAZolin (ANCEF) IVPB 2g/100 mL premix     2 g 200 mL/hr over 30 Minutes Intravenous Every 8 hours 11/27/17 1237 11/27/17 1554   11/27/17 0600  ceFAZolin (ANCEF) IVPB 2g/100 mL premix     2 g 200 mL/hr over 30 Minutes Intravenous On call to O.R. 11/27/17 0541 11/27/17 0750      Assessment/Plan: s/p Procedure(s): LAPAROSCOPY DIAGNOSTIC (N/A) PARTIAL GASTRECTOMY (N/A) JEJUNOSTOMY TUBE PLACEMENT (N/A) Encourage PO Con't TFs Mobilizing Awaiting bm, on miralax   LOS: 6 days    Rosario Jacks., [facility] 12/03/2017

## 2017-12-04 ENCOUNTER — Ambulatory Visit: Payer: BLUE CROSS/BLUE SHIELD | Admitting: Pulmonary Disease

## 2017-12-04 LAB — BASIC METABOLIC PANEL
Anion gap: 9 (ref 5–15)
BUN: 17 mg/dL (ref 6–20)
CALCIUM: 9.3 mg/dL (ref 8.9–10.3)
CO2: 34 mmol/L — ABNORMAL HIGH (ref 22–32)
CREATININE: 0.52 mg/dL — AB (ref 0.61–1.24)
Chloride: 93 mmol/L — ABNORMAL LOW (ref 101–111)
Glucose, Bld: 214 mg/dL — ABNORMAL HIGH (ref 65–99)
Potassium: 5.3 mmol/L — ABNORMAL HIGH (ref 3.5–5.1)
SODIUM: 136 mmol/L (ref 135–145)

## 2017-12-04 LAB — CREATININE, SERUM
Creatinine, Ser: 0.48 mg/dL — ABNORMAL LOW (ref 0.61–1.24)
GFR calc Af Amer: >60 mL/min (ref >60)
GFR calc non Af Amer: >60 mL/min (ref >60)

## 2017-12-04 LAB — GLUCOSE, CAPILLARY
GLUCOSE-CAPILLARY: 145 mg/dL — AB (ref 65–99)
GLUCOSE-CAPILLARY: 147 mg/dL — AB (ref 65–99)
GLUCOSE-CAPILLARY: 176 mg/dL — AB (ref 65–99)
GLUCOSE-CAPILLARY: 247 mg/dL — AB (ref 65–99)
Glucose-Capillary: 185 mg/dL — ABNORMAL HIGH (ref 65–99)
Glucose-Capillary: 189 mg/dL — ABNORMAL HIGH (ref 65–99)

## 2017-12-04 LAB — PREALBUMIN: PREALBUMIN: 13.9 mg/dL — AB (ref 18–38)

## 2017-12-04 MED ORDER — POLYETHYLENE GLYCOL 3350 17 G PO PACK
17.0000 g | PACK | Freq: Every day | ORAL | Status: DC
  Administered 2017-12-04: 17 g via ORAL
  Filled 2017-12-04 (×2): qty 1

## 2017-12-04 MED ORDER — INSULIN ASPART 100 UNIT/ML ~~LOC~~ SOLN: 0.0000 [IU] | SUBCUTANEOUS | Status: DC

## 2017-12-04 MED ORDER — INSULIN ASPART 100 UNIT/ML ~~LOC~~ SOLN
0.0000 [IU] | Freq: Three times a day (TID) | SUBCUTANEOUS | Status: DC
  Administered 2017-12-04: 5 [IU] via SUBCUTANEOUS

## 2017-12-04 MED ORDER — PRO-STAT SUGAR FREE PO LIQD
30.0000 mL | Freq: Every day | ORAL | Status: DC
  Administered 2017-12-04 – 2017-12-05 (×2): 30 mL
  Filled 2017-12-04 (×2): qty 30

## 2017-12-04 MED ORDER — OSMOLITE 1.5 CAL PO LIQD
1000.0000 mL | ORAL | Status: DC
  Administered 2017-12-04: 1000 mL
  Filled 2017-12-04 (×2): qty 1000

## 2017-12-04 MED ORDER — METOCLOPRAMIDE HCL 10 MG PO TABS
5.0000 mg | ORAL_TABLET | Freq: Three times a day (TID) | ORAL | Status: DC
  Administered 2017-12-04 – 2017-12-05 (×4): 5 mg via ORAL
  Filled 2017-12-04 (×4): qty 1

## 2017-12-04 MED ORDER — SENNOSIDES-DOCUSATE SODIUM 8.6-50 MG PO TABS
1.0000 | ORAL_TABLET | Freq: Two times a day (BID) | ORAL | Status: DC
  Administered 2017-12-04 – 2017-12-05 (×3): 1 via ORAL
  Filled 2017-12-04 (×3): qty 1

## 2017-12-04 NOTE — Progress Notes (Signed)
7 Days Post-Op   Subjective/Chief Complaint: Occasional nausea.  Thinks nicotine patch is working to curb cravings.  Tolerating tube feeds at goal.     Objective: Vital signs in last 24 hours: Temp:  [97.6 F (36.4 C)-98.7 F (37.1 C)] 97.9 F (36.6 C) (05/28 1600) Pulse Rate:  [103-119] 114 (05/28 1600) Resp:  [19-30] 30 (05/28 1600) BP: (115-140)/(75-93) 122/88 (05/28 1600) SpO2:  [92 %-100 %] 97 % (05/28 1622) Weight:  [56.7 kg (125 lb)] 56.7 kg (125 lb) (05/28 0344) Last BM Date: 12/04/17  Intake/Output from previous day: 05/27 0701 - 05/28 0700 In: 1728.8 [I.V.:230.5; NG/GT:1498.3] Out: 1475 [Urine:1475] Intake/Output this shift: Total I/O In: 581.8 [NG/GT:581.8] Out: 525 [Urine:525]  General appearance: alert and cooperative GI: soft, min dist, nttp, midline inc c/d/i, Jtube in place  Lab Results:  Recent Labs    12/02/17 0220 12/03/17 0231  WBC 9.1 11.6*  HGB 11.3* 12.0*  HCT 34.8* 37.0*  PLT 235 279   BMET Recent Labs    12/02/17 0220 12/04/17 0510 12/04/17 0845  NA 134*  --  136  K 3.7  --  5.3*  CL 96*  --  93*  CO2 30  --  34*  GLUCOSE 117*  --  214*  BUN 12  --  17  CREATININE 0.40* 0.48* 0.52*  CALCIUM 8.3*  --  9.3   PT/INR No results for input(s): LABPROT, INR in the last 72 hours. ABG No results for input(s): PHART, HCO3 in the last 72 hours.  Invalid input(s): PCO2, PO2  Studies/Results: No results found.  Anti-infectives: Anti-infectives (From admission, onward)   Start     Dose/Rate Route Frequency Ordered Stop   11/27/17 1600  ceFAZolin (ANCEF) IVPB 2g/100 mL premix     2 g 200 mL/hr over 30 Minutes Intravenous Every 8 hours 11/27/17 1237 11/27/17 1554   11/27/17 0600  ceFAZolin (ANCEF) IVPB 2g/100 mL premix     2 g 200 mL/hr over 30 Minutes Intravenous On call to O.R. 11/27/17 0541 11/27/17 0750      Assessment/Plan: s/p Procedure(s): LAPAROSCOPY DIAGNOSTIC (N/A) PARTIAL GASTRECTOMY (N/A) JEJUNOSTOMY TUBE PLACEMENT  (N/A) Still trying to get BM Tube feeds at goal. Work toward home tomorrow.   LOS: 7 days    Bobby Caldwell 12/04/2017

## 2017-12-04 NOTE — Progress Notes (Signed)
Nutrition Follow-up  DOCUMENTATION CODES:   Non-severe (moderate) malnutrition in context of chronic illness  INTERVENTION:  Discontinue calorie count.  Provide nocturnal tube feeds using Osmolite 1.5 formula via J-tube at goal rate of 85 ml/hr x 14 hours (6pm-8am)   Provide 30 ml Prostat (or equivalent) once daily per tube.   Tube feeding regimen provides 1885 kcal (100% of needs), 90 grams of protein, and 904 ml of water.   Continue Ensure Enlive po BID, each supplement provides 350 kcal and 20 grams of protein.  NUTRITION DIAGNOSIS:   Moderate Malnutrition related to chronic illness(gastric cancer) as evidenced by moderate fat depletion, moderate muscle depletion; ongoing  GOAL:   Patient will meet greater than or equal to 90% of their needs; met via TF  MONITOR:   PO intake, Supplement acceptance, TF tolerance  REASON FOR ASSESSMENT:   Consult, Malnutrition Screening Tool Enteral/tube feeding initiation and management  ASSESSMENT:   Pt with PMH of severe COPD has smoked since age 81 and severe PVD admitted for gastric ca s/p partial gastrectomy with J tube placement 5/21.   Pt is currently on a soft diet with thin liquids. Meal completion 0% this AM. Pt reports very poor po. Pt reports he is afraid to eat. Family at bedside has been encouraging po intake, however pt no continues to be poor. Noted calorie count ordered. RD to discontinue as pt not eating at meals. Spoke with RN, possible plans for pt to discharge home tomorrow. Plans to change tube feeding orders to nocturnal feeds in anticipation of discharge. Noted bolus feeds not recommended for J-tube feedings. Will continue with Ensure shakes to aid in po intake. Labs and medications reviewed.   Diet Order:   Diet Order           DIET SOFT Room service appropriate? Yes; Fluid consistency: Thin  Diet effective 1400          EDUCATION NEEDS:   Education needs have been addressed  Skin:  Skin Assessment: Skin  Integrity Issues: Skin Integrity Issues:: Incisions Incisions: abdomen  Last BM:  5/28  Height:   Ht Readings from Last 1 Encounters:  11/27/17 5' 5"  (1.651 m)    Weight:   Wt Readings from Last 1 Encounters:  12/04/17 125 lb (56.7 kg)    Ideal Body Weight:  61.8 kg  BMI:  Body mass index is 20.8 kg/m.  Estimated Nutritional Needs:   Kcal:  1700-2000  Protein:  85-100 grams  Fluid:  > 1.7 L/day    Corrin Parker, MS, RD, LDN Pager # [phone] After hours/ weekend pager # [phone]

## 2017-12-04 NOTE — Progress Notes (Signed)
PT had a 9 beat run of SVT. RN checked on pt and pt was asymptomatic. Pt was a little worked up because he was nervous abt having to throw up. I gave his some medication per MAR BP 119/84, HR 120, SPO2 93. Pt is now calm and lying in bed. Will cont to mont.

## 2017-12-05 LAB — GLUCOSE, CAPILLARY
GLUCOSE-CAPILLARY: 229 mg/dL — AB (ref 65–99)
GLUCOSE-CAPILLARY: 122 mg/dL — AB (ref 65–99)
Glucose-Capillary: 197 mg/dL — ABNORMAL HIGH (ref 65–99)
Glucose-Capillary: 98 mg/dL (ref 65–99)

## 2017-12-05 MED ORDER — POLYETHYLENE GLYCOL 3350 17 G PO PACK: 17.0000 g | PACK | Freq: Every day | ORAL | 0 refills | Status: DC

## 2017-12-05 MED ORDER — METOCLOPRAMIDE HCL 5 MG PO TABS: 5.0000 mg | ORAL_TABLET | Freq: Three times a day (TID) | ORAL | 0 refills | Status: DC

## 2017-12-05 MED ORDER — SENNOSIDES-DOCUSATE SODIUM 8.6-50 MG PO TABS: 1.0000 | ORAL_TABLET | Freq: Two times a day (BID) | ORAL | 0 refills | Status: DC

## 2017-12-05 MED ORDER — NICOTINE 14 MG/24HR TD PT24: 21.0000 mg | MEDICATED_PATCH | Freq: Every day | TRANSDERMAL | 0 refills | Status: DC

## 2017-12-05 MED ORDER — PRO-STAT SUGAR FREE PO LIQD: 30.0000 mL | Freq: Every day | ORAL | 0 refills | Status: DC

## 2017-12-05 MED ORDER — ACETAMINOPHEN 325 MG PO TABS: 650.0000 mg | ORAL_TABLET | Freq: Four times a day (QID) | ORAL | 0 refills | Status: DC | PRN

## 2017-12-05 MED ORDER — ENSURE ENLIVE PO LIQD: 237.0000 mL | Freq: Two times a day (BID) | ORAL | 12 refills | Status: DC

## 2017-12-05 MED ORDER — PANTOPRAZOLE SODIUM 40 MG PO TBEC: 40.0000 mg | DELAYED_RELEASE_TABLET | Freq: Every day | ORAL | 0 refills | Status: DC

## 2017-12-05 MED ORDER — TRAMADOL HCL 50 MG PO TABS: 50.0000 mg | ORAL_TABLET | Freq: Four times a day (QID) | ORAL | 1 refills | Status: DC | PRN

## 2017-12-05 MED ORDER — OSMOLITE 1.5 CAL PO LIQD: 1000.0000 mL | ORAL | 3 refills | Status: DC

## 2017-12-05 NOTE — Discharge Instructions (Signed)
[facility]      [facility] °[phone] ° °ABDOMINAL SURGERY: POST OP INSTRUCTIONS ° °Always review your discharge instruction sheet given to you by the facility where your surgery was performed. ° °IF YOU HAVE DISABILITY OR FAMILY LEAVE FORMS, YOU MUST BRING THEM TO THE OFFICE FOR PROCESSING.  PLEASE DO NOT GIVE THEM TO YOUR DOCTOR. ° °1. A prescription for pain medication may be given to you upon discharge.  Take your pain medication as prescribed, if needed.  If narcotic pain medicine is not needed, then you may take acetaminophen (Tylenol) or ibuprofen (Advil) as needed. °2. Take your usually prescribed medications unless otherwise directed. °3. If you need a refill on your pain medication, please contact your pharmacy. They will contact our office to request authorization.  Prescriptions will not be filled after 5pm or on week-ends. °4. You should follow a light diet the first few days after arrival home, such as soup and crackers, pudding, etc.unless your doctor has advised otherwise. A high-fiber, low fat diet can be resumed as tolerated.   Be sure to include lots of fluids daily. Most patients will experience some swelling and bruising on the chest and neck area.  Ice packs will help.  Swelling and bruising can take several days to resolve °5. Most patients will experience some swelling and bruising in the area of the incision. Ice pack will help. Swelling and bruising can take several days to resolve..  °6. It is common to experience some constipation if taking pain medication after surgery.  Increasing fluid intake and taking a stool softener will usually help or prevent this problem from occurring.  A mild laxative (Milk of Magnesia or Miralax) should be taken according to package directions if there are no bowel movements after 48 hours. °7.  You may have steri-strips (small skin tapes) in place directly over the incision.  These strips should be left on the skin for 10-14 days.  If your  surgeon used skin glue on the incision, you may shower in 48 hours.  The glue will flake off over the next 2-3 weeks.  Any sutures or staples will be removed at the office during your follow-up visit. You may find that a light gauze bandage over your incision may keep your staples from being rubbed or pulled. You may shower and replace the bandage daily. °8. ACTIVITIES:  You may resume regular (light) daily activities beginning the next day--such as daily self-care, walking, climbing stairs--gradually increasing activities as tolerated.  You may have sexual intercourse when it is comfortable.  Refrain from any heavy lifting or straining until approved by your doctor. °a. You may drive when you no longer are taking prescription pain medication, you can comfortably wear a seatbelt, and you can safely maneuver your car and apply brakes °b. Return to Work: __________8 weeks if applicable_________________________ °9. You should see your doctor in the office for a follow-up appointment approximately two weeks after your surgery.  Make sure that you call for this appointment within a day or two after you arrive home to insure a convenient appointment time. °OTHER INSTRUCTIONS:  °_____________________________________________________________ °_____________________________________________________________ ° °WHEN TO CALL YOUR DOCTOR: °1. Fever over 101.0 °2. Inability to urinate °3. Nausea and/or vomiting °4. Extreme swelling or bruising °5. Continued bleeding from incision. °6. Increased pain, redness, or drainage from the incision. °7. Difficulty swallowing or breathing °8. Muscle cramping or spasms. °9. Numbness or tingling in hands or feet or around lips. ° °The clinic staff is   available to answer your questions during regular business hours.  Please dont hesitate to call and ask to speak to one of the nurses if you have concerns.  For further questions, please visit [email or web address]      Care of a  Feeding Tube Feeding tubes are often given to those who have trouble swallowing or cannot take food or medicine. A feeding tube can:  Go into the nose and down to the stomach.  Go through the skin in the belly (abdomen) and into the stomach or small bowel.  Supplies needed to care for the tube site:  Clean gloves.  Clean wash cloth, gauze pads, or soft paper towel.  Cotton swabs.  Skin barrier ointment or cream.  Soap and water.  Precut foam pads or gauze (that go around the tube).  Tube tape. Tube site care 1. Have all supplies ready. 2. Wash hands well. 3. Put on clean gloves. 4. Remove dirty foam pads or gauze near the tube site, if present. 5. Check the skin around the tube site for redness, rash, puffiness (swelling), leaking fluid, or extra tissue growth. Call your doctor if you see any of these. 6. Wet the gauze and cotton swabs with water and soap. 7. Wipe the area closest to the tube with cotton swabs. Wipe the surrounding skin with moistened gauze. Rinse with water. 8. Dry the skin and tube site with a dry gauze pad or soft paper towel. Do not use antibiotic ointments at the tube site. 9. If the skin is red, apply petroleum jelly in a circular motion, using a cotton swab. Your doctor may suggest a different cream or ointment. Use what the doctor suggests. 10. Apply a new pre-cut foam pad or gauze around the tube. Tape the edges down. Foam pads or gauze may be left off if there is no fluid at the tube site. 11. Use tape or a device that will attach your feeding tube to your skin or do as directed. Rotate where you tape the tube. 12. Sit the person up. 13. Throw away used supplies. 14. Remove gloves. 15. Wash hands. Supplies needed to flush a feeding tube:  Clean gloves.  60 mL syringe (that connects to feeding tube).  Towel.  Water. Flushing a feeding tube 1. Have all supplies ready. 2. Wash hands well. 3. Put on clean gloves. 4. Pull 30 mL of water into  the syringe. 5. Bend (kink) the feeding tube while disconnecting it from the feeding-bag tubing or while removing the plug at the end of the tube. 6. Insert the tip of the syringe into the end of the feeding tube. Stop bending the tube. Slowly inject the water. 7. If you cannot inject the water, the person with the feeding tube should lay on their left side. ? Do not use a syringe smaller than 60 mL to flush the tubing. ? Do not inject the water with force. 8. After injecting the water, remove the syringe. 9. Always flush the tube before giving the first medicine, between medicines, and after the final medicine before starting a feeding. ? Do not mix medicines with liquid food (formula) before giving medicines. ? Do not mix medicines with other medicines before giving medicines. ? Completely flush medicines through the tube so they do not mix with the liquid food. 10. Throw away used supplies. 11. Remove gloves. 12. Wash hands. This information is not intended to replace advice given to you by your health care provider. Make sure you discuss any  questions you have with your health care provider. Document Released: 03/20/2012 Document Revised: 12/02/2015 Document Reviewed: 02/08/2012 Elsevier Interactive Patient Education  2017 [employer].

## 2017-12-05 NOTE — Progress Notes (Signed)
8 Days Post-Op   Subjective/Chief Complaint: Doing much better.  Tolerating night time feeds.  Had several BMs.    Objective: Vital signs in last 24 hours: Temp:  [97.9 F (36.6 C)-98.6 F (37 C)] 98.2 F (36.8 C) (05/29 0800) Pulse Rate:  [101-114] 101 (05/29 0806) Resp:  [19-30] 22 (05/29 0806) BP: (115-132)/(84-91) 132/91 (05/29 0806) SpO2:  [90 %-98 %] 97 % (05/29 0837) Weight:  [57.5 kg (126 lb 12.2 oz)] 57.5 kg (126 lb 12.2 oz) (05/29 0500) Last BM Date: 12/04/17  Intake/Output from previous day: 05/28 0701 - 05/29 0700 In: 1702.9 [NG/GT:1632.9] Out: 2076 [Urine:2075; Stool:1] Intake/Output this shift: No intake/output data recorded.  General appearance: alert and cooperative GI: soft, min dist, nttp, midline inc c/d/i, Jtube in place  Lab Results:  Recent Labs    12/03/17 0231  WBC 11.6*  HGB 12.0*  HCT 37.0*  PLT 279   BMET Recent Labs    12/04/17 0510 12/04/17 0845  NA  --  136  K  --  5.3*  CL  --  93*  CO2  --  34*  GLUCOSE  --  214*  BUN  --  17  CREATININE 0.48* 0.52*  CALCIUM  --  9.3   PT/INR No results for input(s): LABPROT, INR in the last 72 hours. ABG No results for input(s): PHART, HCO3 in the last 72 hours.  Invalid input(s): PCO2, PO2  Studies/Results: No results found.  Anti-infectives: Anti-infectives (From admission, onward)   Start     Dose/Rate Route Frequency Ordered Stop   11/27/17 1600  ceFAZolin (ANCEF) IVPB 2g/100 mL premix     2 g 200 mL/hr over 30 Minutes Intravenous Every 8 hours 11/27/17 1237 11/27/17 1554   11/27/17 0600  ceFAZolin (ANCEF) IVPB 2g/100 mL premix     2 g 200 mL/hr over 30 Minutes Intravenous On call to O.R. 11/27/17 0541 11/27/17 0750      Assessment/Plan: s/p Procedure(s): LAPAROSCOPY DIAGNOSTIC (N/A) PARTIAL GASTRECTOMY (N/A) JEJUNOSTOMY TUBE PLACEMENT (N/A) . Home with tube feeds and soft diet as tolerated.     LOS: 8 days    [name] 12/05/2017

## 2017-12-05 NOTE — Progress Notes (Addendum)
Patient discharged home with daughter and wife without incident.  Discharge instructions given to and reviewed with patient, wife, and daughter who understands English better.  Patient instructed to call [facility] on their return home to have help to set up tube feeding at night as ordered by Dr. Patient and family agreed with discharge POC.

## 2017-12-05 NOTE — Discharge Summary (Signed)
Physician Discharge Summary  Patient ID: Bobby Caldwell MRN: [id number] DOB/AGE: 1955-09-16 62 y.o.  Admit date: 11/27/2017 Discharge date: 12/05/2017  Admission Diagnoses: Patient Active Problem List   Diagnosis Date Noted  . Status post surgery 11/27/2017  . 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 S/P vascular surgery 11/15/2017  . Pneumonia 09/10/2017  . Acute on chronic respiratory failure with hypoxia ([place]) 09/10/2017  . Exercise hypoxemia 09/04/2017  . Moderate protein-calorie malnutrition ([place]) 07/24/2017  . Port-A-Cath in place 07/06/2017  . DOE (dyspnea on exertion) 06/12/2017  . Atypical chest pain 06/12/2017  . Essential hypertension 06/12/2017  . Preop cardiovascular exam 06/12/2017  . Gastric cancer ([place]) 06/04/2017  . Gastritis and gastroduodenitis   . Gastric mass   . Atherosclerosis of native arteries of extremity with intermittent claudication ([place]) 05/03/2017  . Critical lower limb ischemia 05/03/2017  . Abnormal CT of the chest 08/02/2015  . Underweight 03/30/2015  . COPD (chronic obstructive pulmonary disease) with emphysema ([place]) 02/16/2015  . Weight loss 02/16/2015  . Cigarette smoker 02/16/2015    Discharge Diagnoses:  Active Problems:   Status post surgery   Primary adenocarcinoma of pyloric antrum ([facility]) and same as above  Discharged Condition: stable  Hospital Course:  Pt is a 62 yo M who was admitted to the ICU following dx laparoscopy, distal gastrectomy with 16 Fr feeding tube 11/27/2017 due to severe COPD.  He had an epidural for pain control.  He did very well and was mobile when asked.  He had his NGT removed on POD 2.  He tolerated clears initially, but developed n/v.  He was started on tube feeds.  These were kept at trophic levels for several days.  He was able to tolerate transition to oral meds and advance on tube feeds.  He had issues with constipation and in fact required multiple meds to have a bowel movement.    He  was ambulatory.  He was discharged to home in stable condition on full tube feeds cycled at night.  His breathing was very stable and at baseline.    Consults: pulmonary/intensive care  Significant Diagnostic Studies: labs: see epic.  Cr 0.52 prior to d/c.  Glucose 214.    Treatments: surgery: see above  Discharge Exam: Blood pressure (!) 132/91, pulse (!) 101, temperature 98.2 F (36.8 C), temperature source Oral, resp. rate (!) 22, height 5\' 5"  (1.651 m), weight 57.5 kg (126 lb 12.2 oz), SpO2 97 %. General appearance: alert, cooperative and no distress Resp: breathing comfortably Cardio: regular rate and rhythm GI: soft, incision c/d/i.  J tube in place Extremities: extremities normal, atraumatic, no cyanosis or edema  Disposition: Discharge disposition: 01-Home or Self Care       Discharge Instructions    Ambulatory referral to [facility]   Complete by:  As directed    Please evaluate Bobby Caldwell for [facility] to [facility].  Disciplines requested: Nursing  Services to provide: Other: tube feeds, oxygen  Physician to follow patient's care (the person listed here will be responsible for signing ongoing orders): Referring Provider  Requested Start of Care Date: Today  I certify that this patient is under my care and that I, or a Nurse Practitioner or Physician's Assistant working with me, had a face-to-face encounter that meets the physician face-to-face requirements with patient on 12/05/2017. The encounter with the patient was in whole, or in part for the following medical condition(s) which is the primary reason for home health care (List medical  condition). Gastric cancer, severe protein calorie malnutrition, delayed gastric emptying, inadequate oral intake  Special Instructions:   Provide nocturnal tube feeds using Osmolite 1.5 formula via J-tube at goal rate of 85 ml/hr x 14 hours (6pm-8am)   Provide 30 ml Prostat (or equivalent) once daily per tube.   Does the  patient have Medicare or Medicaid?:  No   The encounter with the patient was in whole, or in part, for the following medical condition, which is the primary reason for home health care:  gastric cancer, delayed gastric emptying, severe protein calorie malnutrition, inadequate oral intake   Reason for Medically Necessary Home Health Services:  Skilled Nursing- Assessment and Training for Infusion Therapy, Line Care, and Infection Control   My clinical findings support the need for the above services:  Unable to leave home safely without assistance and/or assistive device   I certify that, based on my findings, the following services are medically necessary home health services:  Nursing   Further, I certify that my clinical findings support that this patient is homebound due to:  Unable to leave home safely without assistance   Call MD for:  difficulty breathing, headache or visual disturbances   Complete by:  As directed    Call MD for:  persistant nausea and vomiting   Complete by:  As directed    Call MD for:  redness, tenderness, or signs of infection (pain, swelling, redness, odor or green/yellow discharge around incision site)   Complete by:  As directed    Call MD for:  severe uncontrolled pain   Complete by:  As directed    Call MD for:  temperature >100.4   Complete by:  As directed    Discharge diet:   Complete by:  As directed    Soft diet as tolerated.   Increase activity slowly   Complete by:  As directed      Allergies as of 12/05/2017   No Known Allergies     Medication List    STOP taking these medications   lidocaine-prilocaine cream Commonly known as:  EMLA   predniSONE 10 MG tablet Commonly known as:  DELTASONE     TAKE these medications   acetaminophen 325 MG tablet Commonly known as:  TYLENOL Take 2 tablets (650 mg total) by mouth every 6 (six) hours as needed for mild pain (or Fever >/= 101).   Albuterol Sulfate 108 (90 Base) MCG/ACT Aepb Commonly known  as:  PROAIR RESPICLICK Inhale 2 puffs into the lungs every 6 (six) hours as needed.   atorvastatin 10 MG tablet Commonly known as:  LIPITOR Take 1 tablet (10 mg total) by mouth daily.   budesonide-formoterol 160-4.5 MCG/ACT inhaler Commonly known as:  SYMBICORT Inhale 2 puffs into the lungs 2 (two) times daily.   clopidogrel 75 MG tablet Commonly known as:  PLAVIX Take 1 tablet (75 mg total) by mouth daily.   dronabinol 2.5 MG capsule Commonly known as:  MARINOL Take 1 capsule (2.5 mg total) by mouth 2 (two) times daily before a meal.   feeding supplement (ENSURE ENLIVE) Liqd Take 237 mLs by mouth 2 (two) times daily between meals.   feeding supplement (OSMOLITE 1.5 CAL) Liqd Place 1,000 mLs into feeding tube daily.   feeding supplement (PRO-STAT SUGAR FREE 64) Liqd Place 30 mLs into feeding tube daily.   ipratropium-albuterol 0.5-2.5 (3) MG/3ML Soln Commonly known as:  DUONEB Take 3 mLs by nebulization 4 (four) times daily.   metoCLOPramide 5 MG  tablet Commonly known as:  REGLAN Take 1 tablet (5 mg total) by mouth 3 (three) times daily before meals.   nicotine 14 mg/24hr patch Commonly known as:  NICODERM CQ - dosed in mg/24 hours Place 2 patches (28 mg total) onto the skin daily.   ondansetron 8 MG tablet Commonly known as:  ZOFRAN Take 1 tablet (8 mg total) by mouth 2 (two) times daily as needed for refractory nausea / vomiting. Start on day 3 after chemotherapy.   oxyCODONE-acetaminophen 5-325 MG tablet Commonly known as:  ROXICET Take 1 tablet by mouth every 6 (six) hours as needed for severe pain.   pantoprazole 40 MG tablet Commonly known as:  PROTONIX Take 1 tablet (40 mg total) by mouth daily at 12 noon.   polyethylene glycol packet Commonly known as:  MIRALAX / GLYCOLAX Take 17 g by mouth daily.   prochlorperazine 10 MG tablet Commonly known as:  COMPAZINE Take 1 tablet (10 mg total) by mouth every 6 (six) hours as needed (Nausea or vomiting).    senna-docusate 8.6-50 MG tablet Commonly known as:  Senokot-S Take 1 tablet by mouth 2 (two) times daily.   Tiotropium Bromide Monohydrate 2.5 MCG/ACT Aers Commonly known as:  SPIRIVA RESPIMAT Inhale 2 puffs into the lungs daily.   traMADol 50 MG tablet Commonly known as:  ULTRAM Take 1 tablet (50 mg total) by mouth every 6 (six) hours as needed for moderate pain.            Durable Medical Equipment  (From admission, onward)        Start     Ordered   12/04/17 1633  For home use only DME Tube feeding pump  Once     12/04/17 1634   12/04/17 1633  For home use only DME Tube feeding  Once    Comments:  Provide nocturnal tube feeds using Osmolite 1.5 formula via J-tube at goal rate of 85 ml/hr x 14 hours (6pm-8am)   Provide 30 ml Prostat (or equivalent) once daily per tube.   Tube feeding regimen provides 1885 kcal (100% of needs), 90 grams of protein, and 904 ml of water.   Continue Ensure Enlive po BID, each supplement provides 350 kcal and 20 grams of protein.   12/04/17 1634     Follow-up Information    [name], MD Follow up in 2 week(s).   Specialty:  General Surgery Contact information: [address] [place] [address] [phone]           Signed: [name] 12/05/2017, 10:15 AM

## 2017-12-05 NOTE — Care Management Note (Signed)
Case Management Note  Patient Details  Name: Talen Poser MRN: [id number] Date of Birth: 1955-10-16  Subjective/Objective:   Pt admitted on 11/27/17 with gastric cancer for partial gastric resection.  PTA, pt independent, lives at home with spouse and daughter.                 Action/Plan: Pt medically stable for dc home today with family.  Pt to dc with J-tube and nocturnal tube feedings.  Pt states daughter and spouse able to provide assistance with tube feedings at dc.  Notified [facility] of dc home today; they will try to have [facility] visit pt this evening to assist with first nocturnal feeding at home.  [facility] to deliver tube feeding and feeding pump this afternoon, and will also provide instructions to pt and family regarding pump use and feeding hookup.  Bedside nurse to provide teaching prior to discharge regarding tube flushes and dressing care.    Expected Discharge Date:  12/05/17               Expected Discharge Plan:  [place]  In-House Referral:     Discharge planning Services  CM Consult  Post Acute Care Choice:  Home Health, Resumption of Svcs/PTA Provider Choice offered to:  Patient  DME Arranged:  Tube feeding, Tube feeding pump DME Agency:  [place]:  RN, Social Work [place] Agency:  [facility] (now [facility])  Status of Service:  Completed, signed off  If discussed at [employer], dates discussed:    Additional Comments:  Reinaldo Raddle, RN, BSN  Trauma/Neuro ICU Case Manager [phone]

## 2017-12-14 ENCOUNTER — Telehealth: Payer: Self-pay | Admitting: Pulmonary Disease

## 2017-12-14 NOTE — Telephone Encounter (Signed)
Spoke with pt. He states that he is still having issues with nausea and wants to see Dr. Lenna Gilford. Advised pt that if he is having nausea he needs to contact his PCP. Pt was adamant about seeing Dr. Lenna Gilford. I have scheduled him to see Dr. Lenna Gilford on 12/20/17 at 9:30am. Nothing further was needed.

## 2017-12-17 NOTE — Anesthesia Post-op Follow-up Note (Signed)
  Anesthesia Pain Follow-up Note  Patient: Bobby Caldwell  Day #: 1  Date of Follow-up: 12/17/2017 Time: 9:31 AM  Last Vitals:  Vitals:   12/05/17 1157 12/05/17 1228  BP:  118/80  Pulse:  (!) 106  Resp:  20  Temp:  36.8 C  SpO2: 96% 95%    Level of Consciousness: alert  Pain: mild   Side Effects:None  Catheter Site Exam:clean     Plan: Continue current therapy of postop epidural at surgeon's request  Lamyah Creed

## 2017-12-17 NOTE — Addendum Note (Signed)
Addendum  created 12/17/17 0933 by Belinda Block, MD   Sign clinical note

## 2017-12-20 ENCOUNTER — Encounter: Payer: Self-pay | Admitting: Pulmonary Disease

## 2017-12-20 ENCOUNTER — Ambulatory Visit (INDEPENDENT_AMBULATORY_CARE_PROVIDER_SITE_OTHER): Payer: BLUE CROSS/BLUE SHIELD | Admitting: Pulmonary Disease

## 2017-12-20 ENCOUNTER — Telehealth: Payer: Self-pay | Admitting: Gastroenterology

## 2017-12-20 VITALS — BP 104/70 | HR 100 | Temp 97.8°F | Ht 65.0 in | Wt 129.0 lb

## 2017-12-20 DIAGNOSIS — I70213 Atherosclerosis of native arteries of extremities with intermittent claudication, bilateral legs: Secondary | ICD-10-CM

## 2017-12-20 DIAGNOSIS — J432 Centrilobular emphysema: Secondary | ICD-10-CM

## 2017-12-20 DIAGNOSIS — E440 Moderate protein-calorie malnutrition: Secondary | ICD-10-CM | POA: Diagnosis not present

## 2017-12-20 DIAGNOSIS — Z9889 Other specified postprocedural states: Secondary | ICD-10-CM

## 2017-12-20 DIAGNOSIS — F17210 Nicotine dependence, cigarettes, uncomplicated: Secondary | ICD-10-CM

## 2017-12-20 DIAGNOSIS — R110 Nausea: Secondary | ICD-10-CM | POA: Diagnosis not present

## 2017-12-20 DIAGNOSIS — R0902 Hypoxemia: Secondary | ICD-10-CM | POA: Diagnosis not present

## 2017-12-20 DIAGNOSIS — R112 Nausea with vomiting, unspecified: Secondary | ICD-10-CM

## 2017-12-20 DIAGNOSIS — C163 Malignant neoplasm of pyloric antrum: Secondary | ICD-10-CM | POA: Diagnosis not present

## 2017-12-20 DIAGNOSIS — R636 Underweight: Secondary | ICD-10-CM

## 2017-12-20 MED ORDER — IPRATROPIUM-ALBUTEROL 0.5-2.5 (3) MG/3ML IN SOLN: 3.0000 mL | Freq: Four times a day (QID) | RESPIRATORY_TRACT | 2 refills | Status: DC

## 2017-12-20 MED ORDER — BUDESONIDE-FORMOTEROL FUMARATE 160-4.5 MCG/ACT IN AERO: 2.0000 | INHALATION_SPRAY | Freq: Two times a day (BID) | RESPIRATORY_TRACT | 6 refills | Status: DC

## 2017-12-20 NOTE — Telephone Encounter (Signed)
Dr [name] this pt has an extensive history.  Please review for urgency and appropriateness of appt.

## 2017-12-20 NOTE — Telephone Encounter (Signed)
Can you find out if he has followed up with the surgeon since d/c from the hospital?  If he has, I would like to see that note if possible.

## 2017-12-20 NOTE — Progress Notes (Signed)
Subjective:     Patient ID: Bobby Caldwell, male   DOB: 03/28/1956, 62 y.o.   MRN: [id number]  HPI  ~  February 16, 2015:  Initial pulmonary consult by SN>   67 y/o gentleman from [place], referred by Minette Brine NP at [facility] (Dr. Glendale Chard), for pulmonary evaluation due to emphysema>      Bobby Caldwell has been a heavy smoker- started at age 35 y/o when they would "roll your own" & smoked continually up to 2ppd for ~48yr before decr to 1ppd more recently, est ~70-80 pack-yr smoking hx... He c/o SOB/ DOE, notes mild cough, sm amt beige sput, no hemptysis, occas sharp CP & palpit, no edema...  He denies signif hx of active lung problems- w/o recurrent bronchitic infections, pneumonia, Tb or known exposure, asthma, etc;  Neither has he had any hx signif cardiac problems- w/o known CAD, CHF, etc... He denies occupational exposures to asbestos, silica, mining, etc...      Bobby Caldwell also notes weight loss- states that his regular weight is ~150# & he currently weighs 113#, 5'7" Tall, BMI=17-18; appetite is poor & is rec to add nutritional supplements Bid betw meals...      Current Meds> NONE- he works at [employer] are too $$ for him... EXAM showed Afeb, VSS, O2sat=97% on RA;  HEENT- neg, mallampati1;  Chest- decr BS bilat w/ sl end-exp wheezing;  Heart- RR w/o m/r/g;  Abd- soft, neg;  Ext- w/o c/c/e;  Neuro- no focal neuro deficits...  CXR 01/15/15 showed hyperinflation, emphysema, NAD...  Spirometry 02/16/15 showed FVC=2.66 (61%), FEV1=1.25 (36%), %1sec=47, mid-flows reduced at 19% predicted... This is c/w severe airflow obstruction and GOLD Stage 3 COPD.  Ambulatory oxygen saturation test 02/16/15 showed O2sat=98% on RA at rest w/ pulse=86/min; he ambulated 3 laps w/ lowest O2sat=98% w/ HR=111/min...  [facility] & scanned into EPIC...   CT Chest w/o contrast> pending (see below) IMP/PLAN>>  Bobby Caldwell has been a life long smoker & has paid a price for  this behavior w/ severe emphysema; despite still working at [employer]/ 4 daughters to support- he continues to smoke 1ppd @$5 per pack= #150/mo & I have appealed to his parental instincts to quit smoking completely but he is not optimistic that he will be able to do so & unwilling to purchase smoking cessation aides like Chantix, Nicotine replacement, etc... In addition to smoking cessation we have prescribed SYMBICORT160- 2spBid, & SPIRIVA daily, along w/ ProairHFA rescue inhaler for prn use (coupons given taking advantage of all known discount programs)... For completeness I have rec a CT Chest (esp in light of his wt loss) and his PCP may want to consider CT Abd & Pelvis as well... He is rec to start eating better, 3 meals/day, plus nutritional supplements Bid;  Finally he needs the full benefits for avail immunization programs=> Prevnar-13, Pneumovax-23, Flu shots, etc (we will leave this to his PCP to administer)... Rec ROV recheck in 6 weeks. ADDENDUM>>  CT Chest 02/22/15 showed severe centrilobular emphysema, no adenopathy, several small pulm nodules- largest is 631min LUL & RML... rec f/u CT in 50m32mo~  March 30, 2015:  6wk ROV w/ SN>        Bobby Caldwell reports that he is improved on the Symbicort160-2spBid, Spiriva daily, and the AlbutHFA prn (hasn't needed);  He notes that he's decr the smoking to 7-10 cig/d now & we discussed the need to quit  completely;  He notes min cough, no sputum or hemoptysis, stable SOB/DOE, no CP/ palpit/ or edema...  EXAM showed Afeb, VSS, O2sat=95% on RA;  HEENT- neg, mallampati1;  Chest- decr BS bilat w/ sl end-exp wheezing;  Heart- RR w/o m/r/g;  Abd- soft, neg;  Ext- w/o c/c/e;  Neuro- no focal neuro deficits... IMP/PLAN>>  Bobby Caldwell is improved on the inhalers and we provided him w/ some samples and Rx but it is unclear if he will be able to afford them- discount cards & drug company programs have been provided... We plan ROV 3-4 months...  ~ August 02, 2015: 655moROV  w/ SN>   Bobby Caldwell returns, unaccompanied for today's visit, states his breathing is unchanged- c/o DOE w/ min activ, cough w/ yellow sput; he denies CP, hemoptysis, edema; he is still able to perform ADLs but SOB w/ walking/ stairs/ etc; he reports that he has decreased his smoking from 2.5ppd to 1ppd at present; ?if he is really using his [place] daily? He admits to running out of his AlbutHFA rescue inhaler...  Severe COPD/ Emphysema> He has evid for severe airflow obstruction, GOLD Stage3 COPD/ Emphysema, no desaturation w/ exercise, CTChest 02/2015 showed severe centrilob emphysema & several small nodules- largest 663min LUL & RML; On Symbicort160-2spBid & Spiriva Respimat-2sp once daily, and AlbutHFA rescue inhaler as needed...  Cigarette smoker> He has been smoking regularly since age [age]est 80+pack-yr smoking hx, he is proud that he has decr fro 2.5ppd to 1ppd currently; we discussed this & I stressed the need to quit completely...  Weight loss> He says his norm wt was ~150# and he has lost down to 114#; we reviewed nutrition, advised Ensure/ Sustacal/ Boost daily... EXAM showed Afeb, VSS, O2sat=95% on RA; HEENT- neg, mallampati1; Chest- decr BS bilat w/ sl end-exp wheezing; Heart- RR w/o m/r/g; Abd- soft, neg; Ext- w/o c/c/e; Neuro- no focal neuro deficits... IMP/PLAN>> Pt has severe COPD/emphysema & still smoking 1ppd- must quit all smoking & take his Symbicort/ Spiriva regularly & AlbutHFA as needed; He can use MUCINEX600- 2Bid w/ fluids for thick phlegm. We plan ROV 55m555mowill plan f/u CTChest at that time...  ~  January 10, 2016:  55mo51mo w/ SN>     Bobby Caldwell is here today w/ his friend & neighbor "like my nephew" named [name] phone #[phone] is still smoking 1ppd and can't vs won't quit, not motivated despite my harsh warnings and the fact that he has 3 daughters;  He remains SOB w/ min activity &  ADLs and estimates that it's been ~76yrs54yrce he could do ADLs w/o the dyspnea;  Notes mild cough, sm amt thick greenish sput, no hemoptysis; notes sl chest discomfort but no severe pain or pleurisy, no f/c/s, no real COPD exac in the interval;  He also reports poor appetite & not eating well, weight is down 5# to 109# today... He informs me that [facility] will [facility]- sched to ret 8/28...  We reviewed the following medical problems during today's office visit >>   Severe COPD/ Emphysema> He has evid for severe airflow obstruction, GOLD Stage3 COPD/ Emphysema, no desaturation w/ exercise, CTChest 02/2015 showed severe centrilob emphysema & several small nodules- largest 55mm i22mUL & RML; On Symbicort160-2spBid & Spiriva Respimat-2sp once daily, and AlbutHFA rescue inhaler as needed;  SOB w/ ADLs at baseline, he has not had any signif resp  exac in the interval...    Bilat small pulm nodules on CT chest 02/2015 + centrilobular emphysema>  He is due for f/u CT Chest to assertain if any of the lesions has grown in the interim...  Cigarette smoker> He has been smoking regularly since age 80, est 80+pack-yr smoking hx, he is proud that he has decr fro 2.5ppd to 1ppd currently; we discussed this & I stressed the need to quit completely...  Weight loss/ underweight/ poor appetite> He says his norm wt was ~150# and he has lost down to 109#; we reviewed nutrition, advised Ensure/ Sustacal/ Boost daily... EXAM showed Afeb, VSS, O2sat=94% on RA; Wt=109#, 5'7"Tall, BM17;  HEENT- neg, mallampati1; Chest- decr BS bilat w/ sl end-exp wheezing; Heart- RR w/o m/r/g; Abd- soft, neg; Ext- w/o c/c/e; Neuro- no focal neuro deficits...  Spirometry 01/10/16>  FVC=2.93 (69%), FEV1=1.03 (31%), %1sec=35%, mid-flows reduced at 16% predicted... This is c/w severe airflow obstruction & GOLD Stage 3-4 COPD  Ambulatory Oximetry 01/10/16>  O2sat=95% on RA at rest w/ pulse=99/min;  He ambulated 3 laps w/  lowest O2sat=95% w/ pulse=99/min; no desaturations...   CTChest >> DONE 01/17/16> norm heart size, atherosclerotic changes in Ao & LAD, no adenopathy, mod centrilob emphysema w/ diffuse bronchial wall thickening, bilat calcif granuloma, 77m RML & LUL nodules + several smaller bilat nodules- NO CHANGES from 2016, no new lesions, & no consolidative airsp dis... We plan repeat CT in 147yr. IMP/PLAN>>  Bobby Caldwell understand that he must quite all smoking immediately; his FEV1 has deteriorated 18% in the last yr down to 1.03L;  His oxygenation remains stable w/o desat;  He is asked to quit all smoking, stay on the Symbicort160-2spBid, spiriva Respimat- 2sp once daily, and the ProairHFA rescue inhaler prn;  He is again asked to start nutritional supplements w/ Ensure/ Sustacal/ Boost at least bid; he understands that w/o smoking cessation that his prognosis is very poor;  He works at [employer] asking about disability- I supported his application for this & explained the diff betw disability from a work poActuarys SSI;  His f/u CT Chest is pending...  ~  March 26, 2017:  1449moV & Bobby Caldwell returns after a long hiatus looking very gaunt & weak-- he has lost more weight & now weighs 100#, ~5'7"Tall, BMI=16; and still smoking daily- he admits to 3/4 ppd but has no interest in quitting smoking w/ mult excuses why HE cannot quit... His CC today is weakness & inability to walk w/ leg pain which is worse than his SOB/DOE from his emphysema... He is [place] little english-- we used a traOptometristdy over the speaker phone to communicate w/ him & this was quite satisfactory (call interpreter line at [phone] set this up & if he is at home a 3-way conf call can be set up using his home phone #[phone].    We reviewed the following medical problems during today's office visit> he has not had any medical attention since he was here last- no PCP, no known ER visits, etc...  Severe COPD/ Emphysema>  severe airflow obstruction, GOLD Stage3-4 COPD/ Emphysema, no desaturation w/ exercise, CTChest 02/2015 showed severe centrilob emphysema & several small nodules- largest 6mm83m LUL & RML (no change serially); On Symbicort160-2spBid & Spiriva Respimat-2sp once daily, Mucinex and AlbutHFA rescue inhaler as needed;  SOB w/ ADLs at baseline x yrs, he has not had any signif resp exac in the interval, he says he is more lim by his  leg symptoms...    Bilat small pulm nodules on CT chest 02/2015 + centrilobular emphysema> small bilat lung nodules are unchanged serial w/ CT scans in 2016, 2017, 2018...  Cigarette smoker> He has been smoking regularly since age 49, est 80+pack-yr smoking hx, he is proud that he has decr from 2.5ppd to 1ppd & will try to decr to 1/2ppd; we discussed this & I stressed the need to quit completely!    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  Weight loss/ underweight/ poor appetite> He says his norm wt was ~150# and he has lost down to 100#; we reviewed nutrition, advised 3 meals + Ensure/ Sustacal/ Boost daily... EXAM showed Afeb, VSS w/ BP=150/92, O2sat=96% on RA at rest; Wt=100#, 5'7"Tall, BMI=16;  HEENT- neg, mallampati1; Chest- decr BS bilat w/ prolonged exp phase & congested cough; Heart- RR w/o m/r/g; Abd- thin, soft, sl tender to palp +abd bruits; Ext- w/o c/c/e but pulses are diminished distally + left femoral bruit; Neuro- no focal neuro deficits...  CXR 03/26/17 (independently reviewed by me in the PACS system)> he did not go to our [place] for this requested CXR...  Ambulatory Oximetry on RA 03/26/17> O2sat=96% on RA at rest w/ pulse=105/min;  He ambulated only 2 laps in  the office (185'ea) w/ lowest O2sat=96% w/ pulse=107/min;  He stopped due to leg weakness & pain...  LABS 03/26/17>  Chems- wnl w/ Cr=0.59, norm LFTs;  CBC- wnl w/ Hg=16.3;  TSH=0.59;  BNP=25;  Sed=2...  CT Chest/ Abd/ Pelvis 04/02/17> norm heart size, aortic atherosclerosis but ?no coronary calcif noted; no adenopathy & esoph is ok; diffuse bronchial wall thickening & mod centrilob+paraseptal emphysema; mult small pulm nodules scat bilaterally similar & w/o change from prev scans 2016&2017 (largest RML= 6x30m), no suspicious masses or consolidative airsp dis;  In the Abd/Pelvis small calcif granuloma in liver, otherw neg hepatobiliart tree, GB looks ok, pancreas ok, adrenals/ kidneys/ bladder- all ok; Aortic atherosclerosis w/ fusiform ectasia of infrarenal AA up to 2.6x2.3cm w/ abundant mural thrombus & complete occlusion of the right common iliac art & reconstitution of flow distally, +severe stenosis of prox left common iliac art; prostate ok & no bone lesions identified...  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) and is still smoking w/ a 70-80 pack yr hx, proud of the fact that he has cut back by a few cigarettes, refuses Chantix etc;  Asked to quit completely ASAP (says he'll try to decr to 10 cig/d) & continue resp treatments w/ Symbicort160-2spBid, Spiriva Respimat-2sp daily, Mucinex & Proair;  He is most concerned about his progressive leg symptoms=> clearly related to his severe peripheral vasc dis (ectatic distal Ao w/ mural thrombus, bilat common iliac dis, etc) and he is in need of ASAP eval by [facility] where arteriography will hopefully point to bypass surg that will help his symptoms;  In addition his weight loss & prot-cal malnutrition is alarming- thankfully his labs are essentially wnl & we reviewed nutritional supplements w/ ensure/ sustacal/ carnation instant/ etc...   NOTE:  >50% of this 582m appt was spent in counseling &  coordination of care  Appt w/ [facility] set  up for 04/27/17 at 11:00AM w/ DrChen  ADDENDUM>> ArtDopplers done 04/19/17>  c/w severe arterial disease R>L w/ abn toe-brachial indices as well => he has appt w/ [facility] on 10/19 at 11:00AM..Marland Kitchen  ~  September 04, 2017:  5 month ROV & pulmonary follow up visit>  A lot has happened to Bobby Caldwell in the last 5 months>   CT Abd/Pelvis & ArtDopplers of LEs 9-04/2017 confirmed severe atherosclerotic vasc dis w/ ectasia of infrarenal Ao w/ mural thrombus, an occluded R-iliac art & hi-grade L-common iliac stenosis, ABI's were 0.46R & 0.53L...  DrChen did Aortogram & subseq L=>R Fem-Fem bypass graft 04/2017, pt disch on Plavix & Lipitor added...  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  Oncology consult DrFeng11/2018 w/ decision for FOLFOX chemorx, started 07/06/17  Cardiology eval by DrHarding 06/2017 w/ atypical CP & known PAD; they did Echo/ Myoview (plan for CT Coronary Angio was cancelled)...  His PCP is now Minette Brine     Bobby Caldwell returns now after the 31mohiatus for pulmonary recheck> known severe COPD, hx heavy smoker & still smoking 1/2ppd (he readily admits that he will never stop smoking despite the cost to his health & his family);  His last spirometry showed FEV1=1.03L (31% predicted) and we have been treating him w/ SYMBICORT160-2spBid, Spiriva Respimat- 2sp once daily, Mucinex60115m1-2Bid w/ fluids, and ProairHFA rescue inhaler as needed;  His compliance has been poor & costs may be a factor but he is still smoking!!!  He has NOT required Oxygen to this point as his ambulatory O2 sat tests have not shown desaturations w/ his limited activity...  He had some chest discomfort w/ incr SOB recently & called 911 => EMS came to his house, gave him some oxygen & he felt better, refused t go to the ER, he thinks the O2 made him better & wants  some- I explained how this works, how he must qualify if he wants insurance to cover this (unless he wants to self pay which he cannot afford) & we discussed eval w/ EKG, 2DEcho, another Ambulatory O2 test    We reviewed the following medical problems during today's office visit>   Severe COPD/ Emphysema> severe airflow obstruction, GOLD Stage3-4 COPD/ Emphysema, no desaturation w/ exercise until today, CTChest 02/2015 showed severe centrilob emphysema & several small nodules- largest 15m9mn LUL & RML (no change on serial scans); On Symbicort160-2spBid & Spiriva Respimat-2sp once daily, Mucinex and AlbutHFA rescue inhaler as needed;  SOB w/ ADLs at baseline x yrs, he has not had any signif resp exac in the interval, he says he was more lim by his leg symptoms...    Bilat small pulm nodules on CT chest 02/2015 + centrilobular emphysema> small bilat lung nodules are unchanged serial w/ CT scans in 2016, 2017, 2018...  Cigarette smoker> He has been smoking regularly since age 61, 53st 80+pack-yr smoking hx, he is proud that he has decr from 2.5ppd to 1ppd & will try to decr to 1/2ppd; we discussed this & I stressed the need to quit completely!    ASPVD> he has claudication & vasc insuffic => CT Abd w/ atherosclerotic changes=> fusiform ectasia of infrarenal AA up to 2.6x2.3cm w/ abundant mural thrombus & complete occlusion of the right common iliac art & reconstitution of flow distally, +severe stenosis of prox left common iliac art;  Pt referred to [facility]  w/ abn ABIs bilat & subseq L=>R Fem-Fem bypass graft 04/2017, pt disch on Plavix & Lipitor added...  Weight loss/ underweight/ poor appetite => ADENOCARCINOMA of the Stomach> He says his norm wt was ~150# and he has lost down to 100#; we reviewed nutrition, advised 3 meals + Ensure/ Sustacal/ Boost daily; Pt referred to GI-DrJacobs w/ EGD showing Adenocarcinoma of the stomach => referred to Oncology- DrFeng for chemorx & Surg- DrByerly... EXAM showed Afeb,  VSS w/ BP=116/80, O2sat=95% on RA at rest; Wt=118#, 5'7"Tall, BMI=18;  HEENT- neg, mallampati1; Chest- decr BS bilat w/ prolonged exp phase & congested cough; Heart- RR w/o m/r/g; Abd- thin, soft, sl tender to palp +abd bruits; Ext- w/o c/c/e but pulses are diminished distally + left femoral bruit; Neuro- no focal neuro deficits...  Ambulatory Oxygen Test>  O2sat=95% on RA at rest w/ pulse=64/min;  He ambulated just one lap in the office ([place] w/ nadir O2sat=85% w/ pulse=105/min... HE NOW QUALIFIES FOR PORTABLE O2 SYSTEM.Marland KitchenMarland Kitchen  LABS 09/03/17>  Chems- lytes- ok , Cr=0.56, ALB=2.6;  CBC- Hg=13.6, wbc=9.8.Marland KitchenMarland Kitchen  IMP/PLAN>>  I again implored Bobby Caldwell to quit all smoking but he is not moved- indicates that 10cig/d is not that bad & he will not be able to cut this down further;  He now qualifies for Home O2 & we will set him up w/ home oxygen at 1L/min Qhs and 2L/min w/ activity (he indicated that he would only use it when he felt he needed it for SOB);  Finally we reviewed the need for regular dosing of his medications- Symbicort160-2spBid, Spiriva respimat- 2sp once daily, Mucinex600-1to2Bid w/ fluids, and the ProairHFA as needed;  He certainly has a lot on him w/ stomach cancer (s/p chemorx & considering surg), PAD- s/p fem-fem bypass, other vasc dis, wt loss/ poor nutrition, etc...   ~  October 22, 2017:  11moROV & pt is sent back for Pre-op eval by DrByerly>  Unfortunately the pt continues to smoke ~6cig/d & demonstates deterioration in his Spirometry parameters (see below); he says that this is the best he can do and he will not be able to quit, not interested in trying further, declines Chantix etc; on further questioning he doesn't quite understand why he can't get a lung transplant 1st, then proceed w/ the stomach cancer surgery and I took the time to explain this to him & his wife... I reviewed the following interval medical records in Epic>      He was HOSP by [facility] 3/4 - 3/6 19 w/ COPD exac, acute on chr  hypoxemic resp failure, no organism ident & he responded to Rocephin, Zithromax, Solumedrol, NEBS, oxygen, etc;  XRays, cultures, Labs- reviewed...     He saw XRT- DrMoody on 09/26/17>  Note reviewed, surgery is considered optimal for his cancer, but XRT w/ concurrent ChemoRx is offered if surg is ruled out...     He saw ONCOLOGY- LBurton,NP on 10/08/17>  Note reviewed, they plan to offer concurrent chemoRx w/ Xeloda BID M=>F on days of his XRT w/ DrMoody only if he is determined to NOT be a surg candidate...     She saw SURG- DrByerly on 10/09/17> f/u gastic cancer w/ hx severe COPD & severe PAD (s/p L=>R Fem-Fem bypass 10/18 by DrChen); s/p neoadjuvant chemotherapy w/ DrFeng finished 09/2017;  Note reviewed- DrB wants assessment of operative risk- see below... We reviewed the following medical problems during today's office visit>    Severe COPD/ Emphysema> severe airflow obstruction, GOLD Stage3-4 COPD/ Emphysema, &  exercise induced hypoxemia, CTChest 02/2015 showed severe centrilob emphysema & several small nodules- largest 101m in LUL & RML (no change on serial scans); On Symbicort160-2spBid & Spiriva Respimat-2sp once daily, Mucinex and AlbutHFA rescue inhaler as needed;  SOB w/ ADLs at baseline x yrs, prev more lim in activity by his claudication, he was ADM w/ COPD exac 09/2017 & improved w/ standard therapy...    Bilat small pulm nodules on CT chest 02/2015 + centrilobular emphysema> small bilat lung nodules are unchanged serial w/ CT scans in 2016, 2017, 2018...  Cigarette smoker> He has been smoking regularly since age 62 est 80+pack-yr smoking hx, he is proud that he has decr from 2.5ppd to 1ppd & then down to 1/2ppd; we discussed this & I stressed the need to quit completely!    ASPVD> he has claudication & vasc insuffic => CT Abd w/ atherosclerotic changes=> fusiform ectasia of infrarenal AA up to 2.6x2.3cm w/ abundant mural thrombus & complete occlusion of the right common iliac art &  reconstitution of flow distally, +severe stenosis of prox left common iliac art;  Pt referred to [facility] w/ abn ABIs bilat & subseq L=>R Fem-Fem bypass graft 04/2017, pt disch on Plavix & Lipitor added...  Weight loss/ underweight/ poor appetite => ADENOCARCINOMA of the Stomach> He says his norm wt was ~150# and he has lost down to 100#; we reviewed nutrition, advised 3 meals + Ensure/ Sustacal/ Boost daily; Pt referred to GI-DrJacobs w/ EGD showing Adenocarcinoma of the stomach => referred to Oncology- DrFeng for chemorx & Surg- DrByerly... EXAM showed Afeb, VSS w/ BP=124/62, O2sat=95% on 2L/min; Wt=121#, 5'7"Tall, BMI=18;  HEENT- neg, mallampati1; Chest- decr BS bilat w/ prolonged exp phase & congested cough; Heart- RR w/o m/r/g; Abd- thin, soft, sl tender to palp +abd bruits; Ext- w/o c/c/e but pulses are diminished distally + left femoral bruit; Neuro- no focal neuro deficits...  CXR 09/11/17>  Norm heart size, COPD, emphysema, bibasilar atelectasis, otherw clear lungs, boney demineralization, port-a-cath on right...  CT Angio Chest 09/10/17>  NEG for PE, R-IJ port-a-cath, no pathologically enlarged nodes, mod to severe centrilob emphysema, scattered sm nodules and granulomas...   Spirometry 10/22/17> (fair tracing) FVC=1.79 (44%), FEV1=0.91 (29%), %1sec=51, mid-flows reduced at 10% of the predicted norm... now indicative of GOLD Stage 4 COPD.  LABS 10/2017 in Epic>  Chems- wnl;  CBC- wnl IMP/PLAN>>  He has done reasonably well over the last 325yrw/ his severe COPD/emphysema, despite his continued smoking; He also did reasonably well w/ his major vascular procedure 04/2017; I suspect that his longevity-limiting disease process currently is his cancer diagnosis, but his COPD will surely impact his operative risk & risk of complications (ASA-4); For his part- he wants to proceed w/ the surgery (he is ECOG=1); Given his progression to GOLD Stage 4 COPD, his hypoxemic resp failure, and the fact that he  doesn't even use his inhalers unless he gets samples-- we discussed the option of switching to a Home NEB machine w/ Duoneb Qid + Symbicort160-2spBid & Spiriva once daily; plus OTC MUCUS RELIEF 40010m2tabs tid w/ fluids; plus a course of oral PREDNISONE 70m66md x2wks then one tab qam til return in 3-4wks before given the OK for Surg...   ~  Nov 15, 2017:  3wk ROV & pulmonary follow up visit>  Bobby Simmersmuch improved since we started a home NEBULIZER w/ DUONEB Qid and PREDNISONE (70mg3m x1wk & 70mg 14mafter that);  He has also continued the Symbicort160-2spBid &  Spiriva Respimat-2sp once daily via samples from our office;  He has Home O2 at 2L/min [place] et he continues to smoke 6-10 cig/d!!!  Today's exam is much improved w/ resolution of his congestion/ rhonchi/ wheezing => I suspect he is about as good as he is going to be and this is likely his optimal time to proceed w/ the life-saving abdominal surg for his gastric carcinoma...  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    Bilat small pulm nodules on CT chest 02/2015 + centrilobular emphysema> small bilat lung nodules are unchanged serial w/ CT scans in 2016, 2017, 2018...  Cigarette smoker> He has been smoking regularly since age 83,22est 80+pack-yr smoking hx, he is proud that he has decr from 2.5ppd to 1ppd & then down to  1/2ppd; we discussed this & I stressed the need to quit completely!    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  Weight loss/ underweight/ poor appetite => ADENOCARCINOMA of the Stomach> He says his norm wt was ~150# and he has lost down to 100#; we reviewed nutrition, advised 3 meals + Ensure/ Sustacal/ Boost daily; Pt referred to GI-DrJacobs w/ EGD showing Adenocarcinoma of the stomach => referred to Oncology- DrFeng for chemorx & Surg- DrByerly... EXAM showed Afeb, VSS w/ BP=132/82, O2sat=88% on RA & 96% on 2L/min; Wt up to 131 #, 5'7"Tall, BMI=20;  HEENT- neg, mallampati1; Chest- clear w/ decr BS bilat w/ prolonged exp phase; Heart- RR w/o m/r/g; Abd- thin, soft, sl tender to palp +abd bruits; Ext- w/o c/c/e but pulses are diminished distally + left femoral bruit; Neuro- no focal neuro deficits...  Spirometry 11/15/17>  We attempted repeat spirometry but his technique was very poor & results unreliable... IMP/PLAN>>  I have asked Bobby Caldwell to cut back further on his smoking & work on cessation but he frankly said he would not be able to do any better than this!  He agreed to continue the DUONEB Qid, & cut the Prednisone to 1026mam;  He is asked to wear his oxygen at 1L/min Qhs and 2L/min w/ activity but I am skeptical of his compliance;  we provided him w/ another months worth of Symbicort160 & Spirva Respimat...  I will contact DrByerly regarding his upcoming surg...   ~  December 20, 2017:  38mo85mo & post-hospital check>  Bobby GuriStarlingerwent diagnostic laparoscopy, distal gastrectomy w/ B ll anastomosis, & J-tube placement by DrByerly on  11/27/17 and did very well!; he is receiving nighttime feeds via the tube, he has  not resumed smoking since the surgery!!! PATH showed mod diff AdenoCa 3.8cm, lymphovasc & perineural space invasion, 0/4 lymph nodes, no tumor in the omentum... Doesn't look like he'll need additional chemoRx per DrByerly.    He tells me he tried smoking 1 cig since dish- got N&V therefore stopped! Using nicoderm patches, still has his prev neoadjuvant chemorx porta-cath in place, getting J-tube feedings Qhs, if he eats (eg- rice or potato) after several hours he gets nauseated & vomits dark black fluid-; he has compazine 3m tabs & is taking Reglan 137mTid per DrByerly, prev used Marinol at disch; He will f/u w/ DrJacobs and DrFeng... He is also taking Perccet5 & Tramadol.    Breathing is STABLE- on NEB w/ Duoneb Qid, Symbicort160-2spBid, Spiriva Resp- 2sp once daily, NicodermCQ- 14/24hr patch, AlbutHFA rescue prn; he was 86% on RA on arrival- placed O2 at 2L/min & incr to 94%... We discussed compliance w/ meds and he wants handicap sticker for car... We plan ROV in 72m73mo check progress... EXAM showed Afeb, VSS w/ BP=104/70, O2sat=86% on RA & 94% on 2L/min; Wt is 129 #, 5'7"Tall, BMI=20;  HEENT- neg, mallampati1; Chest- clear w/ decr BS bilat w/ prolonged exp phase; Heart- RR w/o m/r/g; Abd- scar of recent Lap surg, thin, soft, sl tender to palp +abd bruits; Ext- w/o c/c/e but pulses are diminished distally + left femoral bruit; Neuro- no focal neuro deficits...  CXR 12/02/17>  Atherosclerotic calcif in Ao, COPD/E w/ indistict band-like opac at lung bases (atelectasis)  ABG 11/27/17 in Hosp>  pH=7.42,  PCO2=44,  PO2=80  LABS 11/2017>  Chems- ok x BS~120-210;  CBC- Hg~11-12;  Blood type= Bpos IMP/PLAN>>  We reviewed Bobby Caldwell's pulm management- refilled Duoneb, Symbicort, Spiriva respimat, & advised on O2 at 1L/min Qhs & 2L/min w/ activity;  We reviewed benefits of exercise program- but he wants handicap sticker,  and we  gave him our congrats on not smoking; He will maintain f/u w/ DrByerly, DrJacobs, DrFeng...  ` NOTE:  >50% of this 36m89mOV was spent in counseling & coordination of care...   Initially Referred by JaneMinette Brineat [facility] (Dr. RobyGlendale Chard- PMHx, SurgHx, FamHx, and SocHx per PCP is reviewed... Limited English hampers medical history>  He is [place] little english-- we used a tranOptometristr the speaker phone to communicate w/ him... (call interpretor line at [phone] this up & if he is at home a 3-way conf call can be set up using his home phone #[phone]-- pt can call his daughter on cell phone & she can translate...   Past Medical History:  Diagnosis Date  . COPD (chronic obstructive pulmonary disease) (HCC)[place]. Dyspnea   . PAD (peripheral artery disease) (HCC)[place]. Stomach cancer (HCC)[place]. Tobacco abuse   . Weight loss     Past Surgical History:  Procedure Laterality Date  . ABDOMINAL AORTOGRAM W/LOWER EXTREMITY N/A 05/03/2017   Procedure: ABDOMINAL AORTOGRAM W/LOWER EXTREMITY;  Surgeon: ChenConrad Chevak;  [facility];  Service: Cardiovascular;  Laterality: N/A;  . ESOPHAGOGASTRODUODENOSCOPY     05/24/17  . ESOPHAGOGASTRODUODENOSCOPY (EGD) WITH PROPOFOL N/A 05/24/2017   Procedure: ESOPHAGOGASTRODUODENOSCOPY (EGD) WITH PROPOFOL;  Surgeon: JacoMilus Banister;  Location: [facility];  Service: Endoscopy;  Laterality: N/A;  . EUS N/A 06/07/2017   Procedure: UPPER ENDOSCOPIC ULTRASOUND (EUS) RADIAL;  Surgeon: JacoMilus Banister;  Location: [facility];  Service: Endoscopy;  Laterality: N/A;  . FEMORAL-FEMORAL BYPASS GRAFT Bilateral 05/08/2017   Procedure: LEFT TO RIGHT BYPASS GRAFT FEMORAL-FEMORAL ARTERY;  Surgeon: Conrad Gold Beach, MD;  Location: [place];  Service: Vascular;  Laterality: Bilateral;  . IR FLUORO GUIDE PORT INSERTION RIGHT  07/05/2017  . IR US GUIDE VASC ACCESS RIGHT  07/05/2017   . LAPAROSCOPIC INSERTION GASTROSTOMY TUBE N/A 11/27/2017   Procedure: JEJUNOSTOMY TUBE PLACEMENT;  Surgeon: [name], MD;  Location: [place];  Service: General;  Laterality: N/A;  . LAPAROSCOPIC PARTIAL GASTRECTOMY N/A 11/27/2017   Procedure: PARTIAL GASTRECTOMY;  Surgeon: [name], MD;  Location: [place];  Service: General;  Laterality: N/A;  . LAPAROSCOPY N/A 11/27/2017   Procedure: LAPAROSCOPY DIAGNOSTIC;  Surgeon: [name], MD;  Location: [place];  Service: General;  Laterality: N/A;  . NM MYOVIEW LTD  06/2017   LOW RISK. Small basal inferoseptal defect thought to be related to artifact (although cannot exclude prior infarct). Normal wall motion i in this area would suggest artifact not infarct. Otherwise no ischemia or infarction.  Marland Kitchen PERIPHERAL VASCULAR INTERVENTION Left 05/03/2017   Procedure: PERIPHERAL VASCULAR INTERVENTION;  Surgeon: Conrad Placer, MD;  Location: [facility];  Service: Cardiovascular;  Laterality: Left;  common iliac  . TRANSTHORACIC ECHOCARDIOGRAM  06/26/2017   Normal LV size and function. EF 60-65%. No wall motion abnormalities. GR 1 DD. Mild LA dilation    Outpatient Encounter Medications as of 12/20/2017  Medication Sig  . acetaminophen (TYLENOL) 325 MG tablet Take 2 tablets (650 mg total) by mouth every 6 (six) hours as needed for mild pain (or Fever >/= 101).  . Albuterol Sulfate (PROAIR RESPICLICK) 536 (90 Base) MCG/ACT AEPB Inhale 2 puffs into the lungs every 6 (six) hours as needed.  . Amino Acids-Protein Hydrolys (FEEDING SUPPLEMENT, PRO-STAT SUGAR FREE 64,) LIQD Place 30 mLs into feeding tube daily.  Marland Kitchen atorvastatin (LIPITOR) 10 MG tablet Take 1 tablet (10 mg total) by mouth daily.  . budesonide-formoterol (SYMBICORT) 160-4.5 MCG/ACT inhaler Inhale 2 puffs into the lungs 2 (two) times daily.  . clopidogrel (PLAVIX) 75 MG tablet Take 1 tablet (75 mg total) by mouth daily.  Marland Kitchen dronabinol (MARINOL) 2.5 MG capsule Take 1 capsule (2.5 mg total) by  mouth 2 (two) times daily before a meal.  . feeding supplement, ENSURE ENLIVE, (ENSURE ENLIVE) LIQD Take 237 mLs by mouth 2 (two) times daily between meals.  Marland Kitchen ipratropium-albuterol (DUONEB) 0.5-2.5 (3) MG/3ML SOLN Take 3 mLs by nebulization 4 (four) times daily.  . metoCLOPramide (REGLAN) 5 MG tablet Take 1 tablet (5 mg total) by mouth 3 (three) times daily before meals.  . nicotine (NICODERM CQ - DOSED IN MG/24 HOURS) 14 mg/24hr patch Place 2 patches (28 mg total) onto the skin daily.  . Nutritional Supplements (FEEDING SUPPLEMENT, OSMOLITE 1.5 CAL,) LIQD Place 1,000 mLs into feeding tube daily.  . ondansetron (ZOFRAN) 8 MG tablet Take 1 tablet (8 mg total) by mouth 2 (two) times daily as needed for refractory nausea / vomiting. Start on day 3 after chemotherapy.  Marland Kitchen oxyCODONE-acetaminophen (ROXICET) 5-325 MG tablet Take 1 tablet by mouth every 6 (six) hours as needed for severe pain.  . pantoprazole (PROTONIX) 40 MG tablet Take 1 tablet (40 mg total) by mouth daily at 12 noon.  . polyethylene glycol (MIRALAX / GLYCOLAX) packet Take 17 g by mouth daily.  . prochlorperazine (COMPAZINE) 10 MG tablet Take 1 tablet (10 mg total) by mouth every 6 (six) hours as  needed (Nausea or vomiting).  Marland Kitchen senna-docusate (SENOKOT-S) 8.6-50 MG tablet Take 1 tablet by mouth 2 (two) times daily.  . Tiotropium Bromide Monohydrate (SPIRIVA RESPIMAT) 2.5 MCG/ACT AERS Inhale 2 puffs into the lungs daily.  . traMADol (ULTRAM) 50 MG tablet Take 1 tablet (50 mg total) by mouth every 6 (six) hours as needed for moderate pain.  . [DISCONTINUED] budesonide-formoterol (SYMBICORT) 160-4.5 MCG/ACT inhaler Inhale 2 puffs into the lungs 2 (two) times daily.  . [DISCONTINUED] ipratropium-albuterol (DUONEB) 0.5-2.5 (3) MG/3ML SOLN Take 3 mLs by nebulization 4 (four) times daily.   No facility-administered encounter medications on file as of 12/20/2017.     No Known Allergies   Current Medications, Allergies, Past Medical History,  Past Surgical History, Family History, and Social History were reviewed in [employer] record.   Review of Systems            All symptoms NEG except where BOLDED >>  Constitutional:  F/C/S, fatigue, anorexia, unexpected weight change. HEENT:  HA, visual changes, hearing loss, earache, nasal symptoms, sore throat, mouth sores, hoarseness. Resp:  cough, sputum, hemoptysis; SOB, tightness, wheezing. Cardio:  CP, palpit, DOE, orthopnea, edema; leg weakness & pain w/ ambulation GI:  N/V/D/C, blood in stool; reflux, abd pain, distention, gas. GU:  dysuria, freq, urgency, hematuria, flank pain, voiding difficulty. MS:  joint pain, swelling, tenderness, decr ROM; neck pain, back pain, etc. Neuro:  HA, tremors, seizures, dizziness, syncope, weakness, numbness, gait abn. Skin:  suspicious lesions or skin rash. Heme:  adenopathy, bruising, bleeding. Psyche:  confusion, agitation, sleep disturbance, hallucinations, anxiety, depression suicidal.   Objective:   Physical Exam      Vital Signs:  Reviewed...   General:  WD, cachectic, 62 y/o WM in NAD; alert & oriented; pleasant & cooperative... HEENT:  [place]/AT; Conjunctiva- pink, Sclera- nonicteric, EOM-wnl, PERRLA, EACs-clear, TMs-wnl; NOSE-clear; THROAT-clear & wnl.  Neck:  Supple w/ fair ROM; no JVD; normal carotid impulses w/o bruits; no thyromegaly or nodules palpated; no lymphadenopathy.  Chest:  Decr BS bilat, unable to augment BS voluntarily, clear without wheezes or rales; +congested cough/ rhonchi bilat Heart:  Epig PMI, Regular Rhythm; norm S1 & S2 without murmurs, rubs, or gallops detected. Abdomen:  Thin, soft & nontender- no guarding or rebound; normal bowel sounds; no organomegaly or masses palpated; left abd/ groin bruit Ext:  Normal ROM; without deformities or arthritic changes; no varicose veins, venous insuffic, or edema; LE pulses absent Neuro:  No focal neuro deficits; sensory testing normal; gait normal &  balance OK. Derm:  No lesions noted; no rash etc. Lymph:  No cervical, supraclavicular, axillary, or inguinal adenopathy palpated.   Assessment:      IMP >>   Severe COPD/ Emphysema, hypoxemic resp failure> on Symbicort160-2spBid & Spiriva Respimat-2sp once daily, Mucinex and AlbutHFA rescue inhaler as needed;  SOB w/ ADLs at baseline...    Bilat small pulm nodules on CT chest 02/2015 + centrilobular emphysema>  Serial CT scans w/o change in small bilat pulm nodules 2016-2017-2018  Cigarette smoker> He has been smoking regularly since age 13, est 80+pack-yr smoking hx, he is proud that he has decr fro 2.5ppd to 1ppd & he promises to cut to 10cig/d...    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  Weight loss/ underweight/ poor appetite> He says his norm wt was ~150# and he has lost down to 100#; we reviewed nutrition, advised Ensure/ Sustacal/ Boost daily...  PLAN >>  02/16/15>  Bobby Caldwell has been a life long smoker & has paid a price for this behavior w/ severe emphysema; despite still working at [employer] w/ 4 daughters to support- he continues to smoke 1ppd @$5 per pack= #150/mo & I have appealed to his parental instincts to quit smoking completely but he is not optimistic that he will be able to do so & unwilling to purchase smoking cessation aides like Chantix, Nicotine replacement, etc... In addition to smoking cessation we have prescribed SYMBICORT160- 2spBid, & SPIRIVA daily, along w/ ProairHFA rescue inhaler for prn use (coupons given taking advantage of all known discount programs)... For completeness I have rec a CT Chest (esp in light of his wt loss) and his PCP may want to consider CT Abd &  Pelvis as well... He is rec to start eating better, 3 meals/day, plus nutritional supplements Bid;  Finally he needs the full benefits for avail immunization programs=> Prevnar-13, Pneumovax-23, Flu shots, etc (we will leave this to his PCP to administer)...  03/30/15>  Bobby Caldwell is improved on the inhalers and we provided him w/ some samples and Rx but it is unclear if he will be able to afford them- discount cards & drug company programs have been provided...  08/02/15>   Pt has severe COPD/emphysema & still smoking 1ppd- must quit all smoking & take his Symbicort/ Spiriva regularly & AlbutHFA as needed; He can use MUCINEX600- 2Bid w/ fluids for thick phlegm. We plan ROV 47mo& will plan f/u CTChest at that time 01/10/16>   He must quit all smoking; f/u CT Chest is pending; continue Symbicort/ Spiriva/ Proair... 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) and is still smoking w/ a 70-80 pack yr hx, proud of the fact that he has cut back by a few cigarettes, refuses Chantix etc;  Asked to quit completely ASAP (says he'll try to decr to 10 cig/d) & continue resp treatments w/ Symbicort160-2spBid, Spiriva Respimat-2sp daily, Mucinex & Proair;  He is most concerned about his progressive leg symptoms=> clearly related to his severe peripheral vasc dis (ectatic distal Ao w/ mural thrombus, bilat common iliac dis, etc) and he is in need of ASAP eval by VVS where arteriography will hopefully point to bypass surg that will help his symptoms;  In addition his weight loss & prot-cal malnutrition is alarming- thankfully his labs are essentially wnl & we reviewed nutritional supplements w/ ensure/ sustacal/ carnation instant/ etc..  09/04/17>   I again implored Bobby Caldwell to quit all smoking but he is not moved- indicates that 10cig/d is not that bad & he will not be able to cut this down further;  He now qualifies for Home O2 & we will set him up w/ home oxygen at 1L/min Qhs and 2L/min w/ activity (he  indicated that he would only use it when he felt he needed it for SOB);  Finally we reviewed the need for regular dosing of his medications- Symbicort160-2spBid, Spiriva respimat- 2sp once daily, Mucinex600-1to2Bid w/ fluids, and the ProairHFA as needed;  He certainly has a lot on him w/ stomach cancer (s/p chemorx & considering surg), PAD- s/p fem-fem bypass, other vasc dis, wt loss/ poor nutrition, etc...  10/22/17>   He has done reasonably  well over the last 74yr w/ his severe COPD/emphysema, despite his continued smoking; He also did reasonably well w/ his major vascular procedure 04/2017; I suspect that his longevity-limiting disease process currently is his cancer diagnosis, but his COPD will surely impact his operative risk & risk of complications (ASA-4); For his part- he wants to proceed w/ the surgery (he is ECOG=1); Given his progression to GOLD Stage 4 COPD, his hypoxemic resp failure, and the fact that he doesn't even use his inhalers unless he gets samples-- we discussed the option of switching to a Home NEB machine w/ Duoneb Qid + Symbicort160-2spBid & Spiriva once daily; plus OTC MUCUS RELIEF 4059m 2tabs tid w/ fluids; plus a course of oral PREDNISONE 207mid x2wks then one tab qam til return in 3-4wks before given the OK for Surg.   11/15/17>   I have asked Bobby Caldwell to cut back further on his smoking & work on cessation but he frankly said he would not be able to do any better than this!  He agreed to continue the DUONEB Qid, & cut the Prednisone to 35m16mm;  He is asked to wear his oxygen at 1L/min Qhs and 2L/min w/ activity but I am skeptical of his compliance;  we provided him w/ another months worth of Symbicort160 & Spirva Respimat...  I will contact DrByerly regarding his upcoming surg. 12/20/17>   IMP/PLAN>>  We reviewed Bobby Caldwell's pulm management- refilled Duoneb, Symbicort, Spiriva respimat, & advised on O2 at 1L/min Qhs & 2L/min w/ activity;  We reviewed benefits of exercise program- but he  wants handicap sticker,  and we gave him our congrats on not smoking; He will maintain f/u w/ DrByerly, DrJacobs, DrFeng...    Plan:     Home OXYGEN at 2L/min w/ activity & 1L/min Qhs...  Patient's Medications  New Prescriptions   No medications on file  Previous Medications   ACETAMINOPHEN (TYLENOL) 325 MG TABLET    Take 2 tablets (650 mg total) by mouth every 6 (six) hours as needed for mild pain (or Fever >/= 101).   ALBUTEROL SULFATE (PROAIR RESPICLICK) 108 329 BASE) MCG/ACT AEPB    Inhale 2 puffs into the lungs every 6 (six) hours as needed.   AMINO ACIDS-PROTEIN HYDROLYS (FEEDING SUPPLEMENT, PRO-STAT SUGAR FREE 64,) LIQD    Place 30 mLs into feeding tube daily.   ATORVASTATIN (LIPITOR) 10 MG TABLET    Take 1 tablet (10 mg total) by mouth daily.   CLOPIDOGREL (PLAVIX) 75 MG TABLET    Take 1 tablet (75 mg total) by mouth daily.   DRONABINOL (MARINOL) 2.5 MG CAPSULE    Take 1 capsule (2.5 mg total) by mouth 2 (two) times daily before a meal.   FEEDING SUPPLEMENT, ENSURE ENLIVE, (ENSURE ENLIVE) LIQD    Take 237 mLs by mouth 2 (two) times daily between meals.   METOCLOPRAMIDE (REGLAN) 5 MG TABLET    Take 1 tablet (5 mg total) by mouth 3 (three) times daily before meals.   NICOTINE (NICODERM CQ - DOSED IN MG/24 HOURS) 14 MG/24HR PATCH    Place 2 patches (28 mg total) onto the skin daily.   NUTRITIONAL SUPPLEMENTS (FEEDING SUPPLEMENT, OSMOLITE 1.5 CAL,) LIQD    Place 1,000 mLs into feeding tube daily.   ONDANSETRON (ZOFRAN) 8 MG TABLET    Take 1 tablet (8 mg total) by mouth 2 (two) times daily as needed for refractory nausea / vomiting. Start on day 3 after chemotherapy.   OXYCODONE-ACETAMINOPHEN (ROXICET) 5-325 MG  TABLET    Take 1 tablet by mouth every 6 (six) hours as needed for severe pain.   PANTOPRAZOLE (PROTONIX) 40 MG TABLET    Take 1 tablet (40 mg total) by mouth daily at 12 noon.   POLYETHYLENE GLYCOL (MIRALAX / GLYCOLAX) PACKET    Take 17 g by mouth daily.   PROCHLORPERAZINE  (COMPAZINE) 10 MG TABLET    Take 1 tablet (10 mg total) by mouth every 6 (six) hours as needed (Nausea or vomiting).   SENNA-DOCUSATE (SENOKOT-S) 8.6-50 MG TABLET    Take 1 tablet by mouth 2 (two) times daily.   TIOTROPIUM BROMIDE MONOHYDRATE (SPIRIVA RESPIMAT) 2.5 MCG/ACT AERS    Inhale 2 puffs into the lungs daily.   TRAMADOL (ULTRAM) 50 MG TABLET    Take 1 tablet (50 mg total) by mouth every 6 (six) hours as needed for moderate pain.  Modified Medications   Modified Medication Previous Medication   BUDESONIDE-FORMOTEROL (SYMBICORT) 160-4.5 MCG/ACT INHALER budesonide-formoterol (SYMBICORT) 160-4.5 MCG/ACT inhaler      Inhale 2 puffs into the lungs 2 (two) times daily.    Inhale 2 puffs into the lungs 2 (two) times daily.   IPRATROPIUM-ALBUTEROL (DUONEB) 0.5-2.5 (3) MG/3ML SOLN ipratropium-albuterol (DUONEB) 0.5-2.5 (3) MG/3ML SOLN      Take 3 mLs by nebulization 4 (four) times daily.    Take 3 mLs by nebulization 4 (four) times daily.  Discontinued Medications   No medications on file

## 2017-12-20 NOTE — Patient Instructions (Addendum)
Today we updated your med list in our EPIC system...    Continue your current medications the same...  We refilled your Symbicort (2 inhlations twice daily) & Duoneb (4 times daily) for the NEBULIZER machine...  Use your OXYGEN w/ activity & exercise...  You need to start a regular exercise program!!!  We will refer you back to GI- DrJacobs to investigate why you are not able to get the food you eat thru your stomach...  I will check w/ DrByerly about the Port-a-cath & whether you need to follow up w/ ONCOLOGY- DrFeng...  Continue the night time feedings thru the tube...  We wrote for a handicap sticker for your car...  Call for any questions...  Let's plan a follow up visit in 1 month, sooner if needed for problems.Marland KitchenMarland Kitchen

## 2017-12-20 NOTE — Telephone Encounter (Signed)
Spoke with patient states he is seeing a male Psychologist, sport and exercise in 10 days and is not sure who the doctor is.

## 2017-12-21 NOTE — Telephone Encounter (Signed)
There is not an urgency here. He had partial gastrectomy last month and is taking time to recover. Fortunately he has a surgically placed feeding tube in place and it is working well.  I'd like to see him in 4-6 weeks, that will give him time for outpatient follow up with his surgeon, Dr. [name].

## 2017-12-21 NOTE — Telephone Encounter (Signed)
Patient is scheduled to see Dr.Jacobs 02/26/18.

## 2017-12-24 ENCOUNTER — Encounter: Payer: Self-pay | Admitting: Pulmonary Disease

## 2017-12-24 NOTE — Telephone Encounter (Signed)
Message created in error

## 2018-01-01 ENCOUNTER — Telehealth: Payer: Self-pay | Admitting: Pulmonary Disease

## 2018-01-01 MED ORDER — IPRATROPIUM-ALBUTEROL 0.5-2.5 (3) MG/3ML IN SOLN: 3.0000 mL | Freq: Four times a day (QID) | RESPIRATORY_TRACT | 2 refills | Status: DC

## 2018-01-01 NOTE — Telephone Encounter (Signed)
Spoke with pt in the lobby, he was having trouble getting his Duoneb refilled under Dr, Jeannine Kitten name. I advised him that Amelia Jo has happened before where Medicaid is stating he is not contracted through Medicare. I called the pharmacy to fix the Rx and it went through for 3 dollars. I advised pt that he could pick up his medication at the pharmacy now. Pt understood and nothing further is needed.

## 2018-01-02 ENCOUNTER — Other Ambulatory Visit: Payer: Self-pay | Admitting: *Deleted

## 2018-01-02 ENCOUNTER — Other Ambulatory Visit: Payer: Self-pay | Admitting: General Surgery

## 2018-01-02 ENCOUNTER — Telehealth: Payer: Self-pay | Admitting: Hematology

## 2018-01-02 DIAGNOSIS — R112 Nausea with vomiting, unspecified: Secondary | ICD-10-CM

## 2018-01-02 MED ORDER — BUDESONIDE-FORMOTEROL FUMARATE 160-4.5 MCG/ACT IN AERO: 2.0000 | INHALATION_SPRAY | Freq: Two times a day (BID) | RESPIRATORY_TRACT | 6 refills | Status: DC

## 2018-01-02 NOTE — Telephone Encounter (Signed)
Appointments scheduled letter/calendar mailed to patient and notified per 6/24 sch msg. Unable to contact patient

## 2018-01-08 ENCOUNTER — Telehealth: Payer: Self-pay | Admitting: Pulmonary Disease

## 2018-01-08 NOTE — Telephone Encounter (Signed)
Called and spoke with Nicki Reaper with [facility]. He advised that he will be con't seeing the pt once weekly for medication and G-Tub/disease management for next 9 weeks. He advised to make document, nothing further needed at this time.

## 2018-01-22 ENCOUNTER — Ambulatory Visit: Payer: BLUE CROSS/BLUE SHIELD | Admitting: Pulmonary Disease

## 2018-01-22 VITALS — BP 130/76 | HR 117 | Temp 97.8°F | Ht 65.0 in | Wt 131.4 lb

## 2018-01-22 DIAGNOSIS — C163 Malignant neoplasm of pyloric antrum: Secondary | ICD-10-CM

## 2018-01-22 DIAGNOSIS — Z9889 Other specified postprocedural states: Secondary | ICD-10-CM

## 2018-01-22 DIAGNOSIS — R636 Underweight: Secondary | ICD-10-CM

## 2018-01-22 DIAGNOSIS — R110 Nausea: Secondary | ICD-10-CM

## 2018-01-22 DIAGNOSIS — F17210 Nicotine dependence, cigarettes, uncomplicated: Secondary | ICD-10-CM

## 2018-01-22 DIAGNOSIS — R0902 Hypoxemia: Secondary | ICD-10-CM | POA: Diagnosis not present

## 2018-01-22 DIAGNOSIS — I70211 Atherosclerosis of native arteries of extremities with intermittent claudication, right leg: Secondary | ICD-10-CM

## 2018-01-22 DIAGNOSIS — J432 Centrilobular emphysema: Secondary | ICD-10-CM

## 2018-01-22 DIAGNOSIS — E440 Moderate protein-calorie malnutrition: Secondary | ICD-10-CM

## 2018-01-22 MED ORDER — IPRATROPIUM-ALBUTEROL 0.5-2.5 (3) MG/3ML IN SOLN: 3.0000 mL | Freq: Four times a day (QID) | RESPIRATORY_TRACT | 4 refills | Status: DC

## 2018-01-22 NOTE — Patient Instructions (Signed)
Today we updated your med list in our EPIC system...    Continue your current medications the same...  Continue your same breathing meds--     OXYGEN at 1L/min rest & 2L/min w/ exercise...    We will call [facility] about your portable system & get a better apparatus for you to use...  Continue the DUONEB 3-4 times daily...    Followed by the SYMBICORT (2sprays twice daily) & SPIRIVA RESPIMAT (2sprays once daily)...  We discussed the need for a regular exercise program!!!    Everyday get out & do some walking...  Call for any questions...  Let's plan a follow up visit in 6wks, sooner if needed for problems.Marland KitchenMarland Kitchen

## 2018-01-22 NOTE — Progress Notes (Signed)
Established Previous Bypass   History of Present Illness   Bobby Caldwell is a 62 y.o. (10/15/1955) male who presents with chief complaint: healing up from gastric surgery.    Prior procedures include: 1. Dx lap, distal gastrectomy with Bilroth 2 anastomosis by Dr. [name] (11/27/16) 2. L-to-R fem-fem BPG (05/08/17)   The patient is still recovering from his gastric surgery.  The patient's leg symptoms have NOT progressed.  The patient's symptoms are: resolution of intermittent claudication. The patient's treatment regimen currently included: maximal medical management.  Pt is seeing Dr. [name] next week, during which PEG might be removed.  The patient's PMH, PSH, SH, and FamHx were reviewed on 01/23/18 are unchanged from 11/14/17.  Current Outpatient Medications  Medication Sig Dispense Refill  . acetaminophen (TYLENOL) 325 MG tablet Take 2 tablets (650 mg total) by mouth every 6 (six) hours as needed for mild pain (or Fever >/= 101). 100 tablet 0  . Albuterol Sulfate (PROAIR RESPICLICK) 177 (90 Base) MCG/ACT AEPB Inhale 2 puffs into the lungs every 6 (six) hours as needed. 1 each 6  . Amino Acids-Protein Hydrolys (FEEDING SUPPLEMENT, PRO-STAT SUGAR FREE 64,) LIQD Place 30 mLs into feeding tube daily. 900 mL 0  . atorvastatin (LIPITOR) 10 MG tablet Take 1 tablet (10 mg total) by mouth daily. 30 tablet 2  . budesonide-formoterol (SYMBICORT) 160-4.5 MCG/ACT inhaler Inhale 2 puffs into the lungs 2 (two) times daily. 1 Inhaler 6  . clopidogrel (PLAVIX) 75 MG tablet Take 1 tablet (75 mg total) by mouth daily. 30 tablet 11  . dronabinol (MARINOL) 2.5 MG capsule Take 1 capsule (2.5 mg total) by mouth 2 (two) times daily before a meal. 20 capsule 0  . feeding supplement, ENSURE ENLIVE, (ENSURE ENLIVE) LIQD Take 237 mLs by mouth 2 (two) times daily between meals. 237 mL 12  . ipratropium-albuterol (DUONEB) 0.5-2.5 (3) MG/3ML SOLN Take 3 mLs by nebulization 4 (four) times daily. 360 mL 2  .  metoCLOPramide (REGLAN) 5 MG tablet Take 1 tablet (5 mg total) by mouth 3 (three) times daily before meals. 60 tablet 0  . nicotine (NICODERM CQ - DOSED IN MG/24 HOURS) 14 mg/24hr patch Place 2 patches (28 mg total) onto the skin daily. 21 patch 0  . Nutritional Supplements (FEEDING SUPPLEMENT, OSMOLITE 1.5 CAL,) LIQD Place 1,000 mLs into feeding tube daily. 30 Bottle 3  . ondansetron (ZOFRAN) 8 MG tablet Take 1 tablet (8 mg total) by mouth 2 (two) times daily as needed for refractory nausea / vomiting. Start on day 3 after chemotherapy. 30 tablet 1  . oxyCODONE-acetaminophen (ROXICET) 5-325 MG tablet Take 1 tablet by mouth every 6 (six) hours as needed for severe pain. 20 tablet 0  . pantoprazole (PROTONIX) 40 MG tablet Take 1 tablet (40 mg total) by mouth daily at 12 noon. 30 tablet 0  . polyethylene glycol (MIRALAX / GLYCOLAX) packet Take 17 g by mouth daily. 14 each 0  . prochlorperazine (COMPAZINE) 10 MG tablet Take 1 tablet (10 mg total) by mouth every 6 (six) hours as needed (Nausea or vomiting). 30 tablet 1  . senna-docusate (SENOKOT-S) 8.6-50 MG tablet Take 1 tablet by mouth 2 (two) times daily. 20 tablet 0  . Tiotropium Bromide Monohydrate (SPIRIVA RESPIMAT) 2.5 MCG/ACT AERS Inhale 2 puffs into the lungs daily. 1 Inhaler 6  . traMADol (ULTRAM) 50 MG tablet Take 1 tablet (50 mg total) by mouth every 6 (six) hours as needed for moderate pain. 30 tablet 1  No current facility-administered medications for this visit.     On ROS today: PEG, completed chemo   Physical Examination   Vitals:   01/23/18 0915  BP: (!) 133/92  Pulse: (!) 114  Resp: 16  Temp: (!) 97.1 F (36.2 C)  TempSrc: Oral  SpO2: 96%  Weight: 131 lb (59.4 kg)  Height: 5\' 5"  (1.651 m)   Body mass index is 21.8 kg/m.  General Alert, O x 3, WD, Ill appearing  Pulmonary Sym exp, good B air movt, CTA B  Cardiac RRR, Nl S1, S2, no Murmurs, No rubs, No S3,S4  Vascular Vessel Right Left  Radial Palpable Palpable    Brachial Palpable Palpable  Carotid Palpable, No Bruit Palpable, No Bruit  Aorta Not palpable N/A  Femoral Palpable Palpable  Popliteal Not palpable Not palpable  PT Palpable Palpable  DP Palpable Palpable    Gastro- intestinal soft, non-distended, mild TTP, No guarding or rebound, no HSM, no masses, no CVAT B, No palpable prominent aortic pulse, Surg incisions: healing, intact  Musculo- skeletal M/S 5/5 throughout , Extremities without ischemic changes  , No edema present, No visible varicosities , No Lipodermatosclerosis present  Neurologic Pain and light touch intact in extremities , Motor exam as listed above    Non-Invasive Vascular Imaging Aortoiliac Duplex (01/23/2018)  Ao: 51 c/s  R iliac: 79-127 c/s  L iliac: CIA stent 241 c/s (Vr >4.0)  Fem-fem patent (54 c/s)   Medical Decision Making   Bobby Caldwell is a 62 y.o. male who presents with: Gastric cancer, s/p distal gastrectomy with B2, s/p L-to-R fem-fem BPG .   Pt has strongly palpable fem-fem graft and distal pulse.    The L CIA stent may need repeat angio and intervention, but I would let the patient recover from his recent gastrectomy prior to considering such.  This is the patient's preference also.  Based on the patient's vascular studies and examination, I have offered the patient: BLE ABI, bypass duplex, and aortoiliac duplex in 3 months.  I discussed in depth with the patient the nature of atherosclerosis, and emphasized the importance of maximal medical management including strict control of blood pressure, blood glucose, and lipid levels, obtaining regular exercise, and cessation of smoking.    The patient is aware that without maximal medical management the underlying atherosclerotic disease process will progress, limiting the benefit of any interventions.  I discussed in depth with the patient the need for long term surveillance to improve the primary assisted patency of his bypass.  The patient agrees  to cooperate with such.  The patient is currently on a statin: Lipitor.   The patient is currently on an anti-platelet: Plavix.  Thank you for allowing [place] to participate in this patient's care.   Adele Barthel, MD, FACS [facility] Office: [phone] Pager: [phone]

## 2018-01-23 ENCOUNTER — Other Ambulatory Visit: Payer: Self-pay

## 2018-01-23 ENCOUNTER — Encounter: Payer: Self-pay | Admitting: Vascular Surgery

## 2018-01-23 ENCOUNTER — Ambulatory Visit (INDEPENDENT_AMBULATORY_CARE_PROVIDER_SITE_OTHER): Payer: Medicaid Other | Admitting: Vascular Surgery

## 2018-01-23 ENCOUNTER — Ambulatory Visit (HOSPITAL_COMMUNITY)
Admission: RE | Admit: 2018-01-23 | Discharge: 2018-01-23 | Disposition: A | Discharge status: Home / Self Care | Payer: Medicaid Other | Source: Ambulatory Visit | Attending: Vascular Surgery | Admitting: Vascular Surgery

## 2018-01-23 VITALS — BP 133/92 | HR 114 | Temp 97.1°F | Resp 16 | Ht 65.0 in | Wt 131.0 lb

## 2018-01-23 DIAGNOSIS — I70229 Atherosclerosis of native arteries of extremities with rest pain, unspecified extremity: Secondary | ICD-10-CM

## 2018-01-23 DIAGNOSIS — I708 Atherosclerosis of other arteries: Secondary | ICD-10-CM | POA: Diagnosis not present

## 2018-01-23 DIAGNOSIS — I998 Other disorder of circulatory system: Secondary | ICD-10-CM

## 2018-01-23 DIAGNOSIS — C169 Malignant neoplasm of stomach, unspecified: Secondary | ICD-10-CM

## 2018-01-23 DIAGNOSIS — I739 Peripheral vascular disease, unspecified: Secondary | ICD-10-CM

## 2018-01-28 ENCOUNTER — Telehealth: Payer: Self-pay | Admitting: Pulmonary Disease

## 2018-01-28 MED ORDER — MEGESTROL ACETATE 40 MG/ML PO SUSP: 200.0000 mg | Freq: Four times a day (QID) | ORAL | 2 refills | Status: DC

## 2018-01-28 NOTE — Telephone Encounter (Signed)
Per SN- Megace (generic), 40mg /ml, take 1 tsp QID, 23ml bottle, refill prn.  Order placed and sent to Patient's preferred pharmacy [place], [address] [place].  Instructions given to Patient.  Patient stated understanding.  Nothing further at this time.

## 2018-01-28 NOTE — Telephone Encounter (Signed)
SN Megace (generic) order sent under Rexene Edison, NP to [facility], for [facility] Patient.  Nothing further at this time.  Daneil Dan is aware.

## 2018-02-07 ENCOUNTER — Other Ambulatory Visit: Payer: Self-pay

## 2018-02-07 DIAGNOSIS — I739 Peripheral vascular disease, unspecified: Secondary | ICD-10-CM

## 2018-02-07 DIAGNOSIS — I998 Other disorder of circulatory system: Secondary | ICD-10-CM

## 2018-02-07 DIAGNOSIS — I70229 Atherosclerosis of native arteries of extremities with rest pain, unspecified extremity: Secondary | ICD-10-CM

## 2018-02-12 ENCOUNTER — Encounter: Payer: Self-pay | Admitting: Physician Assistant

## 2018-02-12 ENCOUNTER — Ambulatory Visit: Payer: Medicaid Other | Admitting: Physician Assistant

## 2018-02-12 VITALS — BP 130/86 | HR 115 | Ht 65.0 in | Wt 129.2 lb

## 2018-02-12 DIAGNOSIS — R634 Abnormal weight loss: Secondary | ICD-10-CM | POA: Diagnosis not present

## 2018-02-12 DIAGNOSIS — R630 Anorexia: Secondary | ICD-10-CM

## 2018-02-12 DIAGNOSIS — C169 Malignant neoplasm of stomach, unspecified: Secondary | ICD-10-CM | POA: Diagnosis not present

## 2018-02-12 MED ORDER — MEGESTROL ACETATE 40 MG/ML PO SUSP: 200.0000 mg | Freq: Four times a day (QID) | ORAL | 3 refills | Status: DC

## 2018-02-12 MED ORDER — MEGESTROL ACETATE 40 MG/ML PO SUSP: 200.0000 mg | Freq: Two times a day (BID) | ORAL | 3 refills | Status: DC

## 2018-02-12 NOTE — Patient Instructions (Signed)
We sent refills to [facility] for the Megace.  Take twice daily.  Follow up with Dr. [name] as needed.  Normal BMI (Body Mass Index- based on height and weight) is between 19 and 25. Your BMI today is Body mass index is 21.51 kg/m. Marland Kitchen Please consider follow up  regarding your BMI with your Primary Care Provider.

## 2018-02-12 NOTE — Progress Notes (Signed)
Subjective:    Patient ID: Bobby Caldwell, male    DOB: [birth year], 62 y.o.   MRN: [id number]  HPI Bobby Caldwell is a 62 year old male originally from [place], known to Dr. 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  He has subsequently undergone neoadjuvant chemotherapy with Dr. Burr Medico which she completed in March 2019.  Restaging studies were negative for metastatic disease. He then was scheduled for surgery and underwent laparotomy with distal gastrectomy, Billroth II per Dr. [name] in May 2019.  He had a gastrojejunostomy tube placed.  Patient has been recovering from surgery.  He had a recent visit with pulmonary/Dr. Lenna Gilford , at which time he had complained of not being able to eat, and having a poor appetite.  He had lost about 15 pounds since his surgery.  He had been on tube feeds nocturnally since the time of surgery. He was started on Megace 200 mg #5 cc 4 times daily.  He is also had very recent follow-up with Dr. [name] February 06, 2018.  Tube feeds had been stopped at that time and jejunostomy removed. He says he is eating very well at this time and is not having any problems.  He feels the Megace made a huge difference.  He has also been hungrier since being taken off of tube feeds.  He is able to eat most anything and has been gaining weight very He is up to 129 pounds today which he says is about his baseline.  He has no current complaints of abdominal pain has not had any nausea or vomiting.  He is very pleased with his progress. He is actually decreased the use of Megace over the past week to week and a half had only been using it once daily.  He still having a lot of difficulty with shortness of breath from COPD.   Review of Systems Pertinent positive and negative review of systems were noted in the above HPI section.  All other review of systems was otherwise negative.  Outpatient Encounter Medications as of 02/12/2018  Medication Sig    . acetaminophen (TYLENOL) 325 MG tablet Take 2 tablets (650 mg total) by mouth every 6 (six) hours as needed for mild pain (or Fever >/= 101).  . Albuterol Sulfate (PROAIR RESPICLICK) 027 (90 Base) MCG/ACT AEPB Inhale 2 puffs into the lungs every 6 (six) hours as needed.  . Amino Acids-Protein Hydrolys (FEEDING SUPPLEMENT, PRO-STAT SUGAR FREE 64,) LIQD Place 30 mLs into feeding tube daily.  Marland Kitchen atorvastatin (LIPITOR) 10 MG tablet Take 1 tablet (10 mg total) by mouth daily.  . budesonide-formoterol (SYMBICORT) 160-4.5 MCG/ACT inhaler Inhale 2 puffs into the lungs 2 (two) times daily.  . clopidogrel (PLAVIX) 75 MG tablet Take 1 tablet (75 mg total) by mouth daily.  Marland Kitchen dronabinol (MARINOL) 2.5 MG capsule Take 1 capsule (2.5 mg total) by mouth 2 (two) times daily before a meal.  . feeding supplement, ENSURE ENLIVE, (ENSURE ENLIVE) LIQD Take 237 mLs by mouth 2 (two) times daily between meals.  Marland Kitchen ipratropium-albuterol (DUONEB) 0.5-2.5 (3) MG/3ML SOLN Take 3 mLs by nebulization 4 (four) times daily.  . megestrol (MEGACE) 40 MG/ML suspension Take 5 mLs (200 mg total) by mouth 2 (two) times daily.  . metoCLOPramide (REGLAN) 5 MG tablet Take 1 tablet (5 mg total) by mouth 3 (three) times daily before meals.  . nicotine (NICODERM CQ - DOSED IN MG/24 HOURS) 14 mg/24hr patch Place 2 patches (28 mg  total) onto the skin daily.  . Nutritional Supplements (FEEDING SUPPLEMENT, OSMOLITE 1.5 CAL,) LIQD Place 1,000 mLs into feeding tube daily.  . ondansetron (ZOFRAN) 8 MG tablet Take 1 tablet (8 mg total) by mouth 2 (two) times daily as needed for refractory nausea / vomiting. Start on day 3 after chemotherapy.  Marland Kitchen oxyCODONE-acetaminophen (ROXICET) 5-325 MG tablet Take 1 tablet by mouth every 6 (six) hours as needed for severe pain.  . pantoprazole (PROTONIX) 40 MG tablet Take 1 tablet (40 mg total) by mouth daily at 12 noon.  . polyethylene glycol (MIRALAX / GLYCOLAX) packet Take 17 g by mouth daily.  . prochlorperazine  (COMPAZINE) 10 MG tablet Take 1 tablet (10 mg total) by mouth every 6 (six) hours as needed (Nausea or vomiting).  Marland Kitchen senna-docusate (SENOKOT-S) 8.6-50 MG tablet Take 1 tablet by mouth 2 (two) times daily.  . Tiotropium Bromide Monohydrate (SPIRIVA RESPIMAT) 2.5 MCG/ACT AERS Inhale 2 puffs into the lungs daily.  . traMADol (ULTRAM) 50 MG tablet Take 1 tablet (50 mg total) by mouth every 6 (six) hours as needed for moderate pain.  . [DISCONTINUED] megestrol (MEGACE) 40 MG/ML suspension Take 5 mLs (200 mg total) by mouth 4 (four) times daily.  . [DISCONTINUED] megestrol (MEGACE) 40 MG/ML suspension Take 5 mLs (200 mg total) by mouth 4 (four) times daily.   No facility-administered encounter medications on file as of 02/12/2018.    No Known Allergies Patient Active Problem List   Diagnosis Date Noted  . S/P gastric surgery 01/22/2018  . Nausea 12/20/2017  . Primary adenocarcinoma of pyloric antrum (Bobby Caldwell) 11/27/2017  . S/P vascular surgery 11/15/2017  . Pneumonia 09/10/2017  . Acute on chronic respiratory failure with hypoxia (Bobby Caldwell) 09/10/2017  . Exercise hypoxemia 09/04/2017  . Moderate protein-calorie malnutrition (Bobby Caldwell) 07/24/2017  . Port-A-Cath in place 07/06/2017  . DOE (dyspnea on exertion) 06/12/2017  . Atypical chest pain 06/12/2017  . Essential hypertension 06/12/2017  . Preop cardiovascular exam 06/12/2017  . Gastric cancer (Bobby Caldwell) 06/04/2017  . Gastritis and gastroduodenitis   . Atherosclerosis of native arteries of extremity with intermittent claudication (Bobby Caldwell) 05/03/2017  . Critical lower limb ischemia 05/03/2017  . Abnormal CT of the chest 08/02/2015  . Underweight 03/30/2015  . COPD (chronic obstructive pulmonary disease) with emphysema (Bobby Caldwell) 02/16/2015  . Weight loss 02/16/2015  . Cigarette smoker 02/16/2015   Social History   Socioeconomic History  . Marital status: Married    Spouse name: Not on file  . Number of children: 4  . Years of education: Not on file  .  Highest education level: Not on file  Occupational History  . Not on file  Social Needs  . Financial resource strain: Not on file  . Food insecurity:    Worry: Not on file    Inability: Not on file  . Transportation needs:    Medical: Not on file    Non-medical: Not on file  Tobacco Use  . Smoking status: Former Smoker    Packs/day: 0.50    Years: 50.00    Pack years: 25.00    Types: Cigarettes    Last attempt to quit: 11/2017    Years since quitting: 0.2  . Smokeless tobacco: Never Used  . Tobacco comment: Uses Nicotine Patch  Substance and Sexual Activity  . Alcohol use: No    Alcohol/week: 0.0 oz  . Drug use: No  . Sexual activity: Yes  Lifestyle  . Physical activity:    Days per week: Not on  file    Minutes per session: Not on file  . Stress: Not on file  Relationships  . Social connections:    Talks on phone: Not on file    Gets together: Not on file    Attends religious service: Not on file    Active member of club or organization: Not on file    Attends meetings of clubs or organizations: Not on file    Relationship status: Not on file  . Intimate partner violence:    Fear of current or ex partner: Not on file    Emotionally abused: Not on file    Physically abused: Not on file    Forced sexual activity: Not on file  Other Topics Concern  . Not on file  Social History Narrative  . Not on file    Mr. Schear Family history is unknown by patient.      Objective:    Vitals:   02/12/18 1355  BP: 130/86  Pulse: (!) 115    Physical Exam; older white male in no acute distress, accompanied by his wife.  Patient speaks some [place].  Blood pressure 130/86, pulse 110 height 5 foot 5, weight 129 today BMI of 21.5.  HEENT ;nontraumatic normocephalic EOMI PERRLA sclera anicteric, oral mucosa moist Cardiovascular; regular rate and rhythm with S1-S2 heard Pulmonary; clear bilaterally, Abdomen ;soft, nondistended bowel sounds are present no focal tenderness no  palpable mass or hepatosplenomegaly he has a midline incisional scar and scar from prior feeding tube.  Rectal ;exam not done, capital remedies no clubbing cyanosis or edema skin warm and dry, Neuro psych; alert and oriented, grossly nonfocal mood and affect appropriate       Assessment & Plan:   #82 62 year old male with history of gastric adenocarcinoma-distal diagnosed November 2018.  Patient completed a course of neoadjuvant chemotherapy March 2019, and restaging imaging negative for metastatic disease. Patient underwent distal gastrectomy/Billroth II May 2019 and had placement of feeding jejunostomy.  Patient had a lot of difficulty postoperatively with anorexia, and had some continued weight loss. He is much improved over the past 2 to 3 weeks, off of tube feedings and has actually had the jejunostomy tube removed. Appetite is much improved he is tolerating oral feedings with regular food without any difficulty. He has regained most of his weight  #2 severe COPD-O2 dependent #3 peripheral vascular disease status post femorofemoral bypass 2018 #4 hypertension  Plan;; No further GI intervention indicated at this time, as he is doing very well. He asked for a refill of Megace to have on hand though he is currently weaned off. Have refilled Megace 200 mg / 5 mL p.o. twice daily- which he can use if needed. We are happy to see him back in follow-up as needed    Alfredia Ferguson PA-C 02/12/2018   Cc: Minette Brine, FNP

## 2018-02-13 NOTE — Progress Notes (Signed)
I agree with the above note, plan 

## 2018-02-15 ENCOUNTER — Telehealth: Payer: Self-pay | Admitting: *Deleted

## 2018-02-15 NOTE — Telephone Encounter (Signed)
Received notes from [facility]. Stamped and sent to be scanned on 02-15-2018.

## 2018-02-26 ENCOUNTER — Ambulatory Visit: Payer: BLUE CROSS/BLUE SHIELD | Admitting: Gastroenterology

## 2018-03-11 ENCOUNTER — Encounter: Payer: Self-pay | Admitting: Pulmonary Disease

## 2018-03-11 NOTE — Progress Notes (Signed)
Subjective:     Patient ID: Bobby Caldwell, male   DOB: 07-24-1955, 62 y.o.   MRN: [id number]  HPI  ~  February 16, 2015:  Initial pulmonary consult by SN>   22 y/o gentleman from [place], referred by Minette Brine NP at [facility] (Dr. Glendale Chard), for pulmonary evaluation due to emphysema>      Bobby Caldwell has been a heavy smoker- started at age 63 y/o when they would "roll your own" & smoked continually up to 2ppd for ~62yr before decr to 1ppd more recently, est ~70-80 pack-yr smoking hx... He c/o SOB/ DOE, notes mild cough, sm amt beige sput, no hemptysis, occas sharp CP & palpit, no edema...  He denies signif hx of active lung problems- w/o recurrent bronchitic infections, pneumonia, Tb or known exposure, asthma, etc;  Neither has he had any hx signif cardiac problems- w/o known CAD, CHF, etc... He denies occupational exposures to asbestos, silica, mining, etc...      Bobby Caldwell also notes weight loss- states that his regular weight is ~150# & he currently weighs 113#, 5'7" Tall, BMI=17-18; appetite is poor & is rec to add nutritional supplements Bid betw meals...      Current Meds> NONE- he works at [employer] are too $$ for him... EXAM showed Afeb, VSS, O2sat=97% on RA;  HEENT- neg, mallampati1;  Chest- decr BS bilat w/ sl end-exp wheezing;  Heart- RR w/o m/r/g;  Abd- soft, neg;  Ext- w/o c/c/e;  Neuro- no focal neuro deficits...  CXR 01/15/15 showed hyperinflation, emphysema, NAD...  Spirometry 02/16/15 showed FVC=2.66 (61%), FEV1=1.25 (36%), %1sec=47, mid-flows reduced at 19% predicted... This is c/w severe airflow obstruction and GOLD Stage 3 COPD.  Ambulatory oxygen saturation test 02/16/15 showed O2sat=98% on RA at rest w/ pulse=86/min; he ambulated 3 laps w/ lowest O2sat=98% w/ HR=111/min...  [facility] & scanned into EPIC...   CT Chest w/o contrast> pending (see below) IMP/PLAN>>  Bobby Caldwell has been a life long smoker & has paid a price for  this behavior w/ severe emphysema; despite still working at [employer]/ 4 daughters to support- he continues to smoke 1ppd @$5 per pack= #150/mo & I have appealed to his parental instincts to quit smoking completely but he is not optimistic that he will be able to do so & unwilling to purchase smoking cessation aides like Chantix, Nicotine replacement, etc... In addition to smoking cessation we have prescribed SYMBICORT160- 2spBid, & SPIRIVA daily, along w/ ProairHFA rescue inhaler for prn use (coupons given taking advantage of all known discount programs)... For completeness I have rec a CT Chest (esp in light of his wt loss) and his PCP may want to consider CT Abd & Pelvis as well... He is rec to start eating better, 3 meals/day, plus nutritional supplements Bid;  Finally he needs the full benefits for avail immunization programs=> Prevnar-13, Pneumovax-23, Flu shots, etc (we will leave this to his PCP to administer)... Rec ROV recheck in 6 weeks. ADDENDUM>>  CT Chest 02/22/15 showed severe centrilobular emphysema, no adenopathy, several small pulm nodules- largest is 643min LUL & RML... rec f/u CT in 32m632mo~  March 30, 2015:  6wk ROV w/ SN>        Bobby Caldwell reports that he is improved on the Symbicort160-2spBid, Spiriva daily, and the AlbutHFA prn (hasn't needed);  He notes that he's decr the smoking to 7-10 cig/d now & we discussed the need to quit  completely;  He notes min cough, no sputum or hemoptysis, stable SOB/DOE, no CP/ palpit/ or edema...  EXAM showed Afeb, VSS, O2sat=95% on RA;  HEENT- neg, mallampati1;  Chest- decr BS bilat w/ sl end-exp wheezing;  Heart- RR w/o m/r/g;  Abd- soft, neg;  Ext- w/o c/c/e;  Neuro- no focal neuro deficits... IMP/PLAN>>  Bobby Caldwell is improved on the inhalers and we provided him w/ some samples and Rx but it is unclear if he will be able to afford them- discount cards & drug company programs have been provided... We plan ROV 3-4 months...  ~ August 02, 2015: 655moROV  w/ SN>   Bobby Caldwell returns, unaccompanied for today's visit, states his breathing is unchanged- c/o DOE w/ min activ, cough w/ yellow sput; he denies CP, hemoptysis, edema; he is still able to perform ADLs but SOB w/ walking/ stairs/ etc; he reports that he has decreased his smoking from 2.5ppd to 1ppd at present; ?if he is really using his [place] daily? He admits to running out of his AlbutHFA rescue inhaler...  Severe COPD/ Emphysema> He has evid for severe airflow obstruction, GOLD Stage3 COPD/ Emphysema, no desaturation w/ exercise, CTChest 02/2015 showed severe centrilob emphysema & several small nodules- largest 663min LUL & RML; On Symbicort160-2spBid & Spiriva Respimat-2sp once daily, and AlbutHFA rescue inhaler as needed...  Cigarette smoker> He has been smoking regularly since age [age]est 80+pack-yr smoking hx, he is proud that he has decr fro 2.5ppd to 1ppd currently; we discussed this & I stressed the need to quit completely...  Weight loss> He says his norm wt was ~150# and he has lost down to 114#; we reviewed nutrition, advised Ensure/ Sustacal/ Boost daily... EXAM showed Afeb, VSS, O2sat=95% on RA; HEENT- neg, mallampati1; Chest- decr BS bilat w/ sl end-exp wheezing; Heart- RR w/o m/r/g; Abd- soft, neg; Ext- w/o c/c/e; Neuro- no focal neuro deficits... IMP/PLAN>> Pt has severe COPD/emphysema & still smoking 1ppd- must quit all smoking & take his Symbicort/ Spiriva regularly & AlbutHFA as needed; He can use MUCINEX600- 2Bid w/ fluids for thick phlegm. We plan ROV 55m555mowill plan f/u CTChest at that time...  ~  January 10, 2016:  55mo51mo w/ SN>     Bobby Caldwell is here today w/ his friend & neighbor "like my nephew" named [name] phone #[phone] is still smoking 1ppd and can't vs won't quit, not motivated despite my harsh warnings and the fact that he has 3 daughters;  He remains SOB w/ min activity &  ADLs and estimates that it's been ~76yrs54yrce he could do ADLs w/o the dyspnea;  Notes mild cough, sm amt thick greenish sput, no hemoptysis; notes sl chest discomfort but no severe pain or pleurisy, no f/c/s, no real COPD exac in the interval;  He also reports poor appetite & not eating well, weight is down 5# to 109# today... He informs me that [facility]- sched to ret 8/28...  We reviewed the following medical problems during today's office visit >>   Severe COPD/ Emphysema> He has evid for severe airflow obstruction, GOLD Stage3 COPD/ Emphysema, no desaturation w/ exercise, CTChest 02/2015 showed severe centrilob emphysema & several small nodules- largest 55mm i22mUL & RML; On Symbicort160-2spBid & Spiriva Respimat-2sp once daily, and AlbutHFA rescue inhaler as needed;  SOB w/ ADLs at baseline, he has not had any signif resp  exac in the interval...    Bilat small pulm nodules on CT chest 02/2015 + centrilobular emphysema>  He is due for f/u CT Chest to assertain if any of the lesions has grown in the interim...  Cigarette smoker> He has been smoking regularly since age 47, est 80+pack-yr smoking hx, he is proud that he has decr fro 2.5ppd to 1ppd currently; we discussed this & I stressed the need to quit completely...  Weight loss/ underweight/ poor appetite> He says his norm wt was ~150# and he has lost down to 109#; we reviewed nutrition, advised Ensure/ Sustacal/ Boost daily... EXAM showed Afeb, VSS, O2sat=94% on RA; Wt=109#, 5'7"Tall, BM17;  HEENT- neg, mallampati1; Chest- decr BS bilat w/ sl end-exp wheezing; Heart- RR w/o m/r/g; Abd- soft, neg; Ext- w/o c/c/e; Neuro- no focal neuro deficits...  Spirometry 01/10/16>  FVC=2.93 (69%), FEV1=1.03 (31%), %1sec=35%, mid-flows reduced at 16% predicted... This is c/w severe airflow obstruction & GOLD Stage 3-4 COPD  Ambulatory Oximetry 01/10/16>  O2sat=95% on RA at rest w/ pulse=99/min;  He ambulated 3 laps w/  lowest O2sat=95% w/ pulse=99/min; no desaturations...   CTChest >> DONE 01/17/16> norm heart size, atherosclerotic changes in Ao & LAD, no adenopathy, mod centrilob emphysema w/ diffuse bronchial wall thickening, bilat calcif granuloma, 69m RML & LUL nodules + several smaller bilat nodules- NO CHANGES from 2016, no new lesions, & no consolidative airsp dis... We plan repeat CT in 131yr. IMP/PLAN>>  Bobby Caldwell understand that he must quite all smoking immediately; his FEV1 has deteriorated 18% in the last yr down to 1.03L;  His oxygenation remains stable w/o desat;  He is asked to quit all smoking, stay on the Symbicort160-2spBid, spiriva Respimat- 2sp once daily, and the ProairHFA rescue inhaler prn;  He is again asked to start nutritional supplements w/ Ensure/ Sustacal/ Boost at least bid; he understands that w/o smoking cessation that his prognosis is very poor;  He works at [employer] asking about disability- I supported his application for this & explained the diff betw disability from a work poActuarys SSI;  His f/u CT Chest is pending...  ~  March 26, 2017:  1427moV & Bobby Caldwell returns after a long hiatus looking very gaunt & weak-- he has lost more weight & now weighs 100#, ~5'7"Tall, BMI=16; and still smoking daily- he admits to 3/4 ppd but has no interest in quitting smoking w/ mult excuses why HE cannot quit... His CC today is weakness & inability to walk w/ leg pain which is worse than his SOB/DOE from his emphysema... He is [place] little english-- we used a traOptometristdy over the speaker phone to communicate w/ him & this was quite satisfactory (call interpreter line at [phone] set this up & if he is at home a 3-way conf call can be set up using his home phone #[phone].    We reviewed the following medical problems during today's office visit> he has not had any medical attention since he was here last- no PCP, no known ER visits, etc...  Severe COPD/ Emphysema>  severe airflow obstruction, GOLD Stage3-4 COPD/ Emphysema, no desaturation w/ exercise, CTChest 02/2015 showed severe centrilob emphysema & several small nodules- largest 6mm39m LUL & RML (no change serially); On Symbicort160-2spBid & Spiriva Respimat-2sp once daily, Mucinex and AlbutHFA rescue inhaler as needed;  SOB w/ ADLs at baseline x yrs, he has not had any signif resp exac in the interval, he says he is more lim by his  leg symptoms...    Bilat small pulm nodules on CT chest 02/2015 + centrilobular emphysema> small bilat lung nodules are unchanged serial w/ CT scans in 2016, 2017, 2018...  Cigarette smoker> He has been smoking regularly since age 49, est 80+pack-yr smoking hx, he is proud that he has decr from 2.5ppd to 1ppd & will try to decr to 1/2ppd; we discussed this & I stressed the need to quit completely!    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  Weight loss/ underweight/ poor appetite> He says his norm wt was ~150# and he has lost down to 100#; we reviewed nutrition, advised 3 meals + Ensure/ Sustacal/ Boost daily... EXAM showed Afeb, VSS w/ BP=150/92, O2sat=96% on RA at rest; Wt=100#, 5'7"Tall, BMI=16;  HEENT- neg, mallampati1; Chest- decr BS bilat w/ prolonged exp phase & congested cough; Heart- RR w/o m/r/g; Abd- thin, soft, sl tender to palp +abd bruits; Ext- w/o c/c/e but pulses are diminished distally + left femoral bruit; Neuro- no focal neuro deficits...  CXR 03/26/17 (independently reviewed by me in the PACS system)> he did not go to our [place] for this requested CXR...  Ambulatory Oximetry on RA 03/26/17> O2sat=96% on RA at rest w/ pulse=105/min;  He ambulated only 2 laps in  the office (185'ea) w/ lowest O2sat=96% w/ pulse=107/min;  He stopped due to leg weakness & pain...  LABS 03/26/17>  Chems- wnl w/ Cr=0.59, norm LFTs;  CBC- wnl w/ Hg=16.3;  TSH=0.59;  BNP=25;  Sed=2...  CT Chest/ Abd/ Pelvis 04/02/17> norm heart size, aortic atherosclerosis but ?no coronary calcif noted; no adenopathy & esoph is ok; diffuse bronchial wall thickening & mod centrilob+paraseptal emphysema; mult small pulm nodules scat bilaterally similar & w/o change from prev scans 2016&2017 (largest RML= 6x30m), no suspicious masses or consolidative airsp dis;  In the Abd/Pelvis small calcif granuloma in liver, otherw neg hepatobiliart tree, GB looks ok, pancreas ok, adrenals/ kidneys/ bladder- all ok; Aortic atherosclerosis w/ fusiform ectasia of infrarenal AA up to 2.6x2.3cm w/ abundant mural thrombus & complete occlusion of the right common iliac art & reconstitution of flow distally, +severe stenosis of prox left common iliac art; prostate ok & no bone lesions identified...  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) and is still smoking w/ a 70-80 pack yr hx, proud of the fact that he has cut back by a few cigarettes, refuses Chantix etc;  Asked to quit completely ASAP (says he'll try to decr to 10 cig/d) & continue resp treatments w/ Symbicort160-2spBid, Spiriva Respimat-2sp daily, Mucinex & Proair;  He is most concerned about his progressive leg symptoms=> clearly related to his severe peripheral vasc dis (ectatic distal Ao w/ mural thrombus, bilat common iliac dis, etc) and he is in need of ASAP eval by [facility] where arteriography will hopefully point to bypass surg that will help his symptoms;  In addition his weight loss & prot-cal malnutrition is alarming- thankfully his labs are essentially wnl & we reviewed nutritional supplements w/ ensure/ sustacal/ carnation instant/ etc...   NOTE:  >50% of this 582m appt was spent in counseling &  coordination of care  Appt w/ [facility] set  up for 04/27/17 at 11:00AM w/ DrChen  ADDENDUM>> ArtDopplers done 04/19/17>  c/w severe arterial disease R>L w/ abn toe-brachial indices as well => he has appt w/ [facility] on 10/19 at 11:00AM..Marland Kitchen  ~  September 04, 2017:  5 month ROV & pulmonary follow up visit>  A lot has happened to Bobby Caldwell in the last 5 months>   CT Abd/Pelvis & ArtDopplers of LEs 9-04/2017 confirmed severe atherosclerotic vasc dis w/ ectasia of infrarenal Ao w/ mural thrombus, an occluded R-iliac art & hi-grade L-common iliac stenosis, ABI's were 0.46R & 0.53L...  DrChen did Aortogram & subseq L=>R Fem-Fem bypass graft 04/2017, pt disch on Plavix & Lipitor added...  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  Oncology consult DrFeng11/2018 w/ decision for FOLFOX chemorx, started 07/06/17  Cardiology eval by DrHarding 06/2017 w/ atypical CP & known PAD; they did Echo/ Myoview (plan for CT Coronary Angio was cancelled)...  His PCP is now Minette Brine     Bobby Caldwell returns now after the 31mohiatus for pulmonary recheck> known severe COPD, hx heavy smoker & still smoking 1/2ppd (he readily admits that he will never stop smoking despite the cost to his health & his family);  His last spirometry showed FEV1=1.03L (31% predicted) and we have been treating him w/ SYMBICORT160-2spBid, Spiriva Respimat- 2sp once daily, Mucinex60115m1-2Bid w/ fluids, and ProairHFA rescue inhaler as needed;  His compliance has been poor & costs may be a factor but he is still smoking!!!  He has NOT required Oxygen to this point as his ambulatory O2 sat tests have not shown desaturations w/ his limited activity...  He had some chest discomfort w/ incr SOB recently & called 911 => EMS came to his house, gave him some oxygen & he felt better, refused t go to the ER, he thinks the O2 made him better & wants  some- I explained how this works, how he must qualify if he wants insurance to cover this (unless he wants to self pay which he cannot afford) & we discussed eval w/ EKG, 2DEcho, another Ambulatory O2 test    We reviewed the following medical problems during today's office visit>   Severe COPD/ Emphysema> severe airflow obstruction, GOLD Stage3-4 COPD/ Emphysema, no desaturation w/ exercise until today, CTChest 02/2015 showed severe centrilob emphysema & several small nodules- largest 15m9mn LUL & RML (no change on serial scans); On Symbicort160-2spBid & Spiriva Respimat-2sp once daily, Mucinex and AlbutHFA rescue inhaler as needed;  SOB w/ ADLs at baseline x yrs, he has not had any signif resp exac in the interval, he says he was more lim by his leg symptoms...    Bilat small pulm nodules on CT chest 02/2015 + centrilobular emphysema> small bilat lung nodules are unchanged serial w/ CT scans in 2016, 2017, 2018...  Cigarette smoker> He has been smoking regularly since age 61, 53st 80+pack-yr smoking hx, he is proud that he has decr from 2.5ppd to 1ppd & will try to decr to 1/2ppd; we discussed this & I stressed the need to quit completely!    ASPVD> he has claudication & vasc insuffic => CT Abd w/ atherosclerotic changes=> fusiform ectasia of infrarenal AA up to 2.6x2.3cm w/ abundant mural thrombus & complete occlusion of the right common iliac art & reconstitution of flow distally, +severe stenosis of prox left common iliac art;  Pt referred to [facility]  w/ abn ABIs bilat & subseq L=>R Fem-Fem bypass graft 04/2017, pt disch on Plavix & Lipitor added...  Weight loss/ underweight/ poor appetite => ADENOCARCINOMA of the Stomach> He says his norm wt was ~150# and he has lost down to 100#; we reviewed nutrition, advised 3 meals + Ensure/ Sustacal/ Boost daily; Pt referred to GI-DrJacobs w/ EGD showing Adenocarcinoma of the stomach => referred to Oncology- DrFeng for chemorx & Surg- DrByerly... EXAM showed Afeb,  VSS w/ BP=116/80, O2sat=95% on RA at rest; Wt=118#, 5'7"Tall, BMI=18;  HEENT- neg, mallampati1; Chest- decr BS bilat w/ prolonged exp phase & congested cough; Heart- RR w/o m/r/g; Abd- thin, soft, sl tender to palp +abd bruits; Ext- w/o c/c/e but pulses are diminished distally + left femoral bruit; Neuro- no focal neuro deficits...  Ambulatory Oxygen Test>  O2sat=95% on RA at rest w/ pulse=64/min;  He ambulated just one lap in the office ([place] w/ nadir O2sat=85% w/ pulse=105/min... HE NOW QUALIFIES FOR PORTABLE O2 SYSTEM.Marland KitchenMarland Kitchen  LABS 09/03/17>  Chems- lytes- ok , Cr=0.56, ALB=2.6;  CBC- Hg=13.6, wbc=9.8.Marland KitchenMarland Kitchen  IMP/PLAN>>  I again implored Bobby Caldwell to quit all smoking but he is not moved- indicates that 10cig/d is not that bad & he will not be able to cut this down further;  He now qualifies for Home O2 & we will set him up w/ home oxygen at 1L/min Qhs and 2L/min w/ activity (he indicated that he would only use it when he felt he needed it for SOB);  Finally we reviewed the need for regular dosing of his medications- Symbicort160-2spBid, Spiriva respimat- 2sp once daily, Mucinex600-1to2Bid w/ fluids, and the ProairHFA as needed;  He certainly has a lot on him w/ stomach cancer (s/p chemorx & considering surg), PAD- s/p fem-fem bypass, other vasc dis, wt loss/ poor nutrition, etc...   ~  October 22, 2017:  11moROV & pt is sent back for Pre-op eval by DrByerly>  Unfortunately the pt continues to smoke ~6cig/d & demonstates deterioration in his Spirometry parameters (see below); he says that this is the best he can do and he will not be able to quit, not interested in trying further, declines Chantix etc; on further questioning he doesn't quite understand why he can't get a lung transplant 1st, then proceed w/ the stomach cancer surgery and I took the time to explain this to him & his wife... I reviewed the following interval medical records in Epic>      He was HOSP by [facility] 3/4 - 3/6 19 w/ COPD exac, acute on chr  hypoxemic resp failure, no organism ident & he responded to Rocephin, Zithromax, Solumedrol, NEBS, oxygen, etc;  XRays, cultures, Labs- reviewed...     He saw XRT- DrMoody on 09/26/17>  Note reviewed, surgery is considered optimal for his cancer, but XRT w/ concurrent ChemoRx is offered if surg is ruled out...     He saw ONCOLOGY- LBurton,NP on 10/08/17>  Note reviewed, they plan to offer concurrent chemoRx w/ Xeloda BID M=>F on days of his XRT w/ DrMoody only if he is determined to NOT be a surg candidate...     She saw SURG- DrByerly on 10/09/17> f/u gastic cancer w/ hx severe COPD & severe PAD (s/p L=>R Fem-Fem bypass 10/18 by DrChen); s/p neoadjuvant chemotherapy w/ DrFeng finished 09/2017;  Note reviewed- DrB wants assessment of operative risk- see below... We reviewed the following medical problems during today's office visit>    Severe COPD/ Emphysema> severe airflow obstruction, GOLD Stage3-4 COPD/ Emphysema, &  exercise induced hypoxemia, CTChest 02/2015 showed severe centrilob emphysema & several small nodules- largest 101m in LUL & RML (no change on serial scans); On Symbicort160-2spBid & Spiriva Respimat-2sp once daily, Mucinex and AlbutHFA rescue inhaler as needed;  SOB w/ ADLs at baseline x yrs, prev more lim in activity by his claudication, he was ADM w/ COPD exac 09/2017 & improved w/ standard therapy...    Bilat small pulm nodules on CT chest 02/2015 + centrilobular emphysema> small bilat lung nodules are unchanged serial w/ CT scans in 2016, 2017, 2018...  Cigarette smoker> He has been smoking regularly since age 62 est 80+pack-yr smoking hx, he is proud that he has decr from 2.5ppd to 1ppd & then down to 1/2ppd; we discussed this & I stressed the need to quit completely!    ASPVD> he has claudication & vasc insuffic => CT Abd w/ atherosclerotic changes=> fusiform ectasia of infrarenal AA up to 2.6x2.3cm w/ abundant mural thrombus & complete occlusion of the right common iliac art &  reconstitution of flow distally, +severe stenosis of prox left common iliac art;  Pt referred to [facility] w/ abn ABIs bilat & subseq L=>R Fem-Fem bypass graft 04/2017, pt disch on Plavix & Lipitor added...  Weight loss/ underweight/ poor appetite => ADENOCARCINOMA of the Stomach> He says his norm wt was ~150# and he has lost down to 100#; we reviewed nutrition, advised 3 meals + Ensure/ Sustacal/ Boost daily; Pt referred to GI-DrJacobs w/ EGD showing Adenocarcinoma of the stomach => referred to Oncology- DrFeng for chemorx & Surg- DrByerly... EXAM showed Afeb, VSS w/ BP=124/62, O2sat=95% on 2L/min; Wt=121#, 5'7"Tall, BMI=18;  HEENT- neg, mallampati1; Chest- decr BS bilat w/ prolonged exp phase & congested cough; Heart- RR w/o m/r/g; Abd- thin, soft, sl tender to palp +abd bruits; Ext- w/o c/c/e but pulses are diminished distally + left femoral bruit; Neuro- no focal neuro deficits...  CXR 09/11/17>  Norm heart size, COPD, emphysema, bibasilar atelectasis, otherw clear lungs, boney demineralization, port-a-cath on right...  CT Angio Chest 09/10/17>  NEG for PE, R-IJ port-a-cath, no pathologically enlarged nodes, mod to severe centrilob emphysema, scattered sm nodules and granulomas...   Spirometry 10/22/17> (fair tracing) FVC=1.79 (44%), FEV1=0.91 (29%), %1sec=51, mid-flows reduced at 10% of the predicted norm... now indicative of GOLD Stage 4 COPD.  LABS 10/2017 in Epic>  Chems- wnl;  CBC- wnl IMP/PLAN>>  He has done reasonably well over the last 325yrw/ his severe COPD/emphysema, despite his continued smoking; He also did reasonably well w/ his major vascular procedure 04/2017; I suspect that his longevity-limiting disease process currently is his cancer diagnosis, but his COPD will surely impact his operative risk & risk of complications (ASA-4); For his part- he wants to proceed w/ the surgery (he is ECOG=1); Given his progression to GOLD Stage 4 COPD, his hypoxemic resp failure, and the fact that he  doesn't even use his inhalers unless he gets samples-- we discussed the option of switching to a Home NEB machine w/ Duoneb Qid + Symbicort160-2spBid & Spiriva once daily; plus OTC MUCUS RELIEF 40010m2tabs tid w/ fluids; plus a course of oral PREDNISONE 70m66md x2wks then one tab qam til return in 3-4wks before given the OK for Surg...   ~  Nov 15, 2017:  3wk ROV & pulmonary follow up visit>  DaneLaneta Simmersmuch improved since we started a home NEBULIZER w/ DUONEB Qid and PREDNISONE (70mg3m x1wk & 70mg 14mafter that);  He has also continued the Symbicort160-2spBid &  Spiriva Respimat-2sp once daily via samples from our office;  He has Home O2 at 2L/min [place] et he continues to smoke 6-10 cig/d!!!  Today's exam is much improved w/ resolution of his congestion/ rhonchi/ wheezing => I suspect he is about as good as he is going to be and this is likely his optimal time to proceed w/ the life-saving abdominal surg for his gastric carcinoma...  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    Bilat small pulm nodules on CT chest 02/2015 + centrilobular emphysema> small bilat lung nodules are unchanged serial w/ CT scans in 2016, 2017, 2018...  Cigarette smoker> He has been smoking regularly since age 83,22est 80+pack-yr smoking hx, he is proud that he has decr from 2.5ppd to 1ppd & then down to  1/2ppd; we discussed this & I stressed the need to quit completely!    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  Weight loss/ underweight/ poor appetite => ADENOCARCINOMA of the Stomach> He says his norm wt was ~150# and he has lost down to 100#; we reviewed nutrition, advised 3 meals + Ensure/ Sustacal/ Boost daily; Pt referred to GI-DrJacobs w/ EGD showing Adenocarcinoma of the stomach => referred to Oncology- DrFeng for chemorx & Surg- DrByerly... EXAM showed Afeb, VSS w/ BP=132/82, O2sat=88% on RA & 96% on 2L/min; Wt up to 131 #, 5'7"Tall, BMI=20;  HEENT- neg, mallampati1; Chest- clear w/ decr BS bilat w/ prolonged exp phase; Heart- RR w/o m/r/g; Abd- thin, soft, sl tender to palp +abd bruits; Ext- w/o c/c/e but pulses are diminished distally + left femoral bruit; Neuro- no focal neuro deficits...  Spirometry 11/15/17>  We attempted repeat spirometry but his technique was very poor & results unreliable... IMP/PLAN>>  I have asked Bobby Caldwell to cut back further on his smoking & work on cessation but he frankly said he would not be able to do any better than this!  He agreed to continue the DUONEB Qid, & cut the Prednisone to 1026mam;  He is asked to wear his oxygen at 1L/min Qhs and 2L/min w/ activity but I am skeptical of his compliance;  we provided him w/ another months worth of Symbicort160 & Spirva Respimat...  I will contact DrByerly regarding his upcoming surg...   ~  December 20, 2017:  38mo85mo & post-hospital check>  Mr GuriStarlingerwent diagnostic laparoscopy, distal gastrectomy w/ B ll anastomosis, & J-tube placement by DrByerly on  11/27/17 and did very well!; he is receiving nighttime feeds via the tube, he has  not resumed smoking since the surgery!!! PATH showed mod diff AdenoCa 3.8cm, lymphovasc & perineural space invasion, 0/4 lymph nodes, no tumor in the omentum... Doesn't look like he'll need additional chemoRx per DrByerly.    He tells me he tried smoking 1 cig since dish- got N&V therefore stopped! Using nicoderm patches, still has his prev neoadjuvant chemorx porta-cath in place, getting J-tube feedings Qhs, if he eats (eg- rice or potato) after several hours he gets nauseated & vomits dark black fluid-; he has compazine 42m tabs & is taking Reglan 154mTid per DrByerly, prev used Marinol at disch; He will f/u w/ DrJacobs and DrFeng... He is also taking Perccet5 & Tramadol.    Breathing is STABLE- on NEB w/ Duoneb Qid, Symbicort160-2spBid, Spiriva Resp- 2sp once daily, NicodermCQ- 14/24hr patch, AlbutHFA rescue prn; he was 86% on RA on arrival- placed O2 at 2L/min & incr to 94%... We discussed compliance w/ meds and he wants handicap sticker for car... We plan ROV in 15m23mo check progress... EXAM showed Afeb, VSS w/ BP=104/70, O2sat=86% on RA & 94% on 2L/min; Wt is 129 #, 5'7"Tall, BMI=20;  HEENT- neg, mallampati1; Chest- clear w/ decr BS bilat w/ prolonged exp phase; Heart- RR w/o m/r/g; Abd- scar of recent Lap surg, thin, soft, sl tender to palp +abd bruits; Ext- w/o c/c/e but pulses are diminished distally + left femoral bruit; Neuro- no focal neuro deficits...  CXR 12/02/17>  Atherosclerotic calcif in Ao, COPD/E w/ indistict band-like opac at lung bases (atelectasis)  ABG 11/27/17 in Hosp>  pH=7.42,  PCO2=44,  PO2=80  LABS 11/2017>  Chems- ok x BS~120-210;  CBC- Hg~11-12;  Blood type= Bpos IMP/PLAN>>  We reviewed Bobby Caldwell's pulm management- refilled Duoneb, Symbicort, Spiriva respimat, & advised on O2 at 1L/min Qhs & 2L/min w/ activity;  We reviewed benefits of exercise program- but he wants handicap sticker,  and we  gave him our congrats on not smoking; He will maintain f/u w/ DrByerly, DrJacobs, DrFeng...    ~  January 22, 2018:  15mo53mo & pulm/ medical recheck> Bobby Caldwell continues on tube feeds 2 pack Qhs & reports continued N & V after eating by mouth;  Breathing is at least stable- he has predictably restarted smoking again but quite proud of the fact that it is only 1-2 cigarettes per day now ("I'm not inhaling" he says) & is hopefully doing his meds as prescribed but he once again did not brink med bottles or med list to the OV today...    Since he was here last DrJacobs was contacted regarding the N&V and [facility] in 4-6 weeks...    Pt remains on O2 at 2L/min w/ activity & 1L/min at rest; NEB w/ Duoneb Qid, Symbicort160-2spBid, Spiriva Resp- 2sp once daily, NicodermCQ- 14/24hr patch, AlbutHFA rescue prn;  We are still trying to get him a better port system thru his Aero[facility] ResourcesEXAM showed Afeb, VSS w/ BP=130/76, O2sat=94% on RA & 94% on RA today; Wt is 131# (up 2lbs), 5'7"Tall, BMI=20;  HEENT- neg, mallampati1; Chest- clear w/ decr BS bilat w/ prolonged exp phase; Heart- RR w/o m/r/g; Abd- scar of recent Lap surg, thin, soft, sl tender to palp +abd bruits; Ext- w/o c/c/e but pulses are diminished distally + left femoral bruit; Neuro- no focal neuro deficits... IMP/PLAN>>  His resp status is stable but I am concerned about medication compliance & his risk to escalate smoking in the coming weeks & months- we discussed both issues;  He is still not able to eat by mouth & GI deferred to [facility] & plans f/u in 4-6 weeks;  We are ontinuing to try & get him a better port O2 system to support his need for exercise program... We will recheck pt in 6wks & sooner if needed for resp problems.   Initially Referred by Minette Brine NP at [facility] (Dr. Glendale Chard) --- PMHx, SurgHx, FamHx, and SocHx per PCP is reviewed... Limited English hampers medical history>  He is  [place] & speaks little english-- we used a Optometrist over the speaker phone to communicate w/ him... (call interpretor line at [phone] to set this up & if he is at home a 3-way conf call can be set up using his home phone [phone]) Alternatively-- pt can call his daughter on cell phone & she can translate...   Past Medical History:  Diagnosis Date  . COPD (chronic obstructive pulmonary disease) ([place])   . Dyspnea   . PAD (peripheral artery disease) ([place])   . Stomach cancer ([place])   . Tobacco abuse   . Weight loss     Past Surgical History:  Procedure Laterality Date  . ABDOMINAL AORTOGRAM W/LOWER EXTREMITY N/A 05/03/2017   Procedure: ABDOMINAL AORTOGRAM W/LOWER EXTREMITY;  Surgeon: Conrad Mantua, MD;  Location: [facility];  Service: Cardiovascular;  Laterality: N/A;  . ESOPHAGOGASTRODUODENOSCOPY     05/24/17  . ESOPHAGOGASTRODUODENOSCOPY (EGD) WITH PROPOFOL N/A 05/24/2017   Procedure: ESOPHAGOGASTRODUODENOSCOPY (EGD) WITH PROPOFOL;  Surgeon: [name], MD;  Location: [facility];  Service: Endoscopy;  Laterality: N/A;  . EUS N/A 06/07/2017   Procedure: UPPER ENDOSCOPIC ULTRASOUND (EUS) RADIAL;  Surgeon: [name], MD;  Location: [facility];  Service: Endoscopy;  Laterality: N/A;  . FEMORAL-FEMORAL BYPASS GRAFT Bilateral 05/08/2017   Procedure: LEFT TO RIGHT BYPASS GRAFT FEMORAL-FEMORAL ARTERY;  Surgeon: Conrad Frazer, MD;  Location: [facility];  Service: Vascular;  Laterality: Bilateral;  . IR FLUORO GUIDE PORT INSERTION RIGHT  07/05/2017  . IR US GUIDE VASC ACCESS RIGHT  07/05/2017  . LAPAROSCOPIC INSERTION GASTROSTOMY TUBE N/A 11/27/2017   Procedure: JEJUNOSTOMY TUBE PLACEMENT;  Surgeon: [name], MD;  Location: [facility];  Service: General;  Laterality: N/A;  . LAPAROSCOPIC PARTIAL GASTRECTOMY N/A 11/27/2017   Procedure: PARTIAL GASTRECTOMY;  Surgeon: [name], MD;  Location: [facility];  Service: General;  Laterality: N/A;  . LAPAROSCOPY N/A  11/27/2017   Procedure: LAPAROSCOPY DIAGNOSTIC;  Surgeon: [name], MD;  Location: [facility];  Service: General;  Laterality: N/A;  . NM MYOVIEW LTD  06/2017   LOW RISK. Small basal inferoseptal defect thought to be related to artifact (although cannot exclude prior infarct). Normal wall motion i in this area would suggest artifact not infarct. Otherwise no ischemia or infarction.  Marland Kitchen PERIPHERAL VASCULAR INTERVENTION Left 05/03/2017   Procedure: PERIPHERAL VASCULAR INTERVENTION;  Surgeon: Conrad Midvale, MD;  Location: [facility];  Service: Cardiovascular;  Laterality: Left;  common iliac  . TRANSTHORACIC ECHOCARDIOGRAM  06/26/2017   Normal LV size and function. EF 60-65%. No wall motion abnormalities. GR 1 DD. Mild LA dilation    Outpatient Encounter Medications as of 01/22/2018  Medication Sig  . acetaminophen (TYLENOL) 325 MG tablet Take 2 tablets (650 mg total) by mouth every 6 (six) hours as needed for mild pain (or Fever >/= 101).  . Albuterol Sulfate (PROAIR RESPICLICK) 599 (90 Base) MCG/ACT AEPB Inhale 2 puffs into the lungs every 6 (six)  hours as needed.  . Amino Acids-Protein Hydrolys (FEEDING SUPPLEMENT, PRO-STAT SUGAR FREE 64,) LIQD Place 30 mLs into feeding tube daily.  Marland Kitchen atorvastatin (LIPITOR) 10 MG tablet Take 1 tablet (10 mg total) by mouth daily.  . budesonide-formoterol (SYMBICORT) 160-4.5 MCG/ACT inhaler Inhale 2 puffs into the lungs 2 (two) times daily.  . clopidogrel (PLAVIX) 75 MG tablet Take 1 tablet (75 mg total) by mouth daily.  Marland Kitchen dronabinol (MARINOL) 2.5 MG capsule Take 1 capsule (2.5 mg total) by mouth 2 (two) times daily before a meal.  . feeding supplement, ENSURE ENLIVE, (ENSURE ENLIVE) LIQD Take 237 mLs by mouth 2 (two) times daily between meals.  Marland Kitchen ipratropium-albuterol (DUONEB) 0.5-2.5 (3) MG/3ML SOLN Take 3 mLs by nebulization 4 (four) times daily.  . metoCLOPramide (REGLAN) 5 MG tablet Take 1 tablet (5 mg total) by mouth 3 (three) times daily before meals.   . nicotine (NICODERM CQ - DOSED IN MG/24 HOURS) 14 mg/24hr patch Place 2 patches (28 mg total) onto the skin daily.  . Nutritional Supplements (FEEDING SUPPLEMENT, OSMOLITE 1.5 CAL,) LIQD Place 1,000 mLs into feeding tube daily.  . ondansetron (ZOFRAN) 8 MG tablet Take 1 tablet (8 mg total) by mouth 2 (two) times daily as needed for refractory nausea / vomiting. Start on day 3 after chemotherapy.  Marland Kitchen oxyCODONE-acetaminophen (ROXICET) 5-325 MG tablet Take 1 tablet by mouth every 6 (six) hours as needed for severe pain.  . pantoprazole (PROTONIX) 40 MG tablet Take 1 tablet (40 mg total) by mouth daily at 12 noon.  . polyethylene glycol (MIRALAX / GLYCOLAX) packet Take 17 g by mouth daily.  . prochlorperazine (COMPAZINE) 10 MG tablet Take 1 tablet (10 mg total) by mouth every 6 (six) hours as needed (Nausea or vomiting).  Marland Kitchen senna-docusate (SENOKOT-S) 8.6-50 MG tablet Take 1 tablet by mouth 2 (two) times daily.  . Tiotropium Bromide Monohydrate (SPIRIVA RESPIMAT) 2.5 MCG/ACT AERS Inhale 2 puffs into the lungs daily.  . traMADol (ULTRAM) 50 MG tablet Take 1 tablet (50 mg total) by mouth every 6 (six) hours as needed for moderate pain.  . [DISCONTINUED] ipratropium-albuterol (DUONEB) 0.5-2.5 (3) MG/3ML SOLN Take 3 mLs by nebulization 4 (four) times daily.   No facility-administered encounter medications on file as of 01/22/2018.     No Known Allergies   Current Medications, Allergies, Past Medical History, Past Surgical History, Family History, and Social History were reviewed in [employer] record.   Review of Systems            All symptoms NEG except where BOLDED >>  Constitutional:  F/C/S, fatigue, anorexia, unexpected weight change. HEENT:  HA, visual changes, hearing loss, earache, nasal symptoms, sore throat, mouth sores, hoarseness. Resp:  cough, sputum, hemoptysis; SOB, tightness, wheezing. Cardio:  CP, palpit, DOE, orthopnea, edema; leg weakness & pain w/  ambulation GI:  N/V/D/C, blood in stool; reflux, abd pain, distention, gas. GU:  dysuria, freq, urgency, hematuria, flank pain, voiding difficulty. MS:  joint pain, swelling, tenderness, decr ROM; neck pain, back pain, etc. Neuro:  HA, tremors, seizures, dizziness, syncope, weakness, numbness, gait abn. Skin:  suspicious lesions or skin rash. Heme:  adenopathy, bruising, bleeding. Psyche:  confusion, agitation, sleep disturbance, hallucinations, anxiety, depression suicidal.   Objective:   Physical Exam      Vital Signs:  Reviewed...   General:  WD, cachectic, 62 y/o WM in NAD; alert & oriented; pleasant & cooperative... HEENT:  [place]/AT; Conjunctiva- pink, Sclera- nonicteric, EOM-wnl, PERRLA, EACs-clear, TMs-wnl; NOSE-clear;  THROAT-clear & wnl.  Neck:  Supple w/ fair ROM; no JVD; normal carotid impulses w/o bruits; no thyromegaly or nodules palpated; no lymphadenopathy.  Chest:  Decr BS bilat, unable to augment BS voluntarily, clear without wheezes or rales; +congested cough/ rhonchi bilat Heart:  Epig PMI, Regular Rhythm; norm S1 & S2 without murmurs, rubs, or gallops detected. Abdomen:  Thin, soft & nontender- no guarding or rebound; normal bowel sounds; no organomegaly or masses palpated; left abd/ groin bruit Ext:  Normal ROM; without deformities or arthritic changes; no varicose veins, venous insuffic, or edema; LE pulses absent Neuro:  No focal neuro deficits; sensory testing normal; gait normal & balance OK. Derm:  No lesions noted; no rash etc. Lymph:  No cervical, supraclavicular, axillary, or inguinal adenopathy palpated.   Assessment:      IMP >>   Severe COPD/ Emphysema, hypoxemic resp failure> on Symbicort160-2spBid & Spiriva Respimat-2sp once daily, Mucinex and AlbutHFA rescue inhaler as needed;  SOB w/ ADLs at baseline...    Bilat small pulm nodules on CT chest 02/2015 + centrilobular emphysema>  Serial CT scans w/o change in small bilat pulm nodules 2016-2017-2018   Cigarette smoker> He has been smoking regularly since age 16, est 80+pack-yr smoking hx, he is proud that he has decr fro 2.5ppd to 1ppd & he promises to cut to 10cig/d...    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  Weight loss/ underweight/ poor appetite> He says his norm wt was ~150# and he has lost down to 100#; we reviewed nutrition, advised Ensure/ Sustacal/ Boost daily...  PLAN >>  02/16/15>  Bobby Caldwell has been a life long smoker & has paid a price for this behavior w/ severe emphysema; despite still working at [employer] w/ 4 daughters to support- he continues to smoke 1ppd @$5 per pack= #150/mo & I have appealed to his parental instincts to quit smoking completely but he is not optimistic that he will be able to do so & unwilling to purchase smoking cessation aides like Chantix, Nicotine replacement, etc... In addition to smoking cessation we have prescribed SYMBICORT160- 2spBid, & SPIRIVA daily, along w/ ProairHFA rescue inhaler for prn use (coupons given taking advantage of all known discount programs)... For completeness I have rec a CT Chest (esp in light of his wt loss) and his PCP may want to consider CT Abd & Pelvis as well... He is rec to start eating better, 3 meals/day, plus nutritional supplements Bid;  Finally he needs the full benefits for avail immunization programs=> Prevnar-13, Pneumovax-23, Flu shots, etc (we will leave this to his PCP to administer)...  03/30/15>  Bobby Caldwell is improved on the inhalers and we provided him w/ some samples and Rx but it is unclear if he will be able to afford them- discount cards & drug company programs have been provided...  08/02/15>   Pt has severe COPD/emphysema &  still smoking 1ppd- must quit all smoking & take his Symbicort/ Spiriva regularly & AlbutHFA as needed; He can use MUCINEX600- 2Bid w/ fluids for thick phlegm. We plan ROV 44mo& will plan f/u CTChest at that time 01/10/16>   He must quit all smoking; f/u CT Chest is pending; continue Symbicort/ Spiriva/ Proair... 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) and is still smoking w/ a 70-80 pack yr hx, proud of the fact that he has cut back by a few cigarettes, refuses Chantix etc;  Asked to quit completely ASAP (says he'll try to decr to 10 cig/d) & continue resp treatments w/ Symbicort160-2spBid, Spiriva Respimat-2sp daily, Mucinex & Proair;  He is most concerned about his progressive leg symptoms=> clearly related to his severe peripheral vasc dis (ectatic distal Ao w/ mural thrombus, bilat common iliac dis, etc) and he is in need of ASAP eval by [facility] where arteriography will hopefully point to bypass surg that will help his symptoms;  In addition his weight loss & prot-cal malnutrition is alarming- thankfully his labs are essentially wnl & we reviewed nutritional supplements w/ ensure/ sustacal/ carnation instant/ etc..  09/04/17>   I again implored Bobby Caldwell to quit all smoking but he is not moved- indicates that 10cig/d is not that bad & he will not be able to cut this down further;  He now qualifies for Home O2 & we will set him up w/ home oxygen at 1L/min Qhs and 2L/min w/ activity (he indicated that he would only use it when he felt he needed it for SOB);  Finally we reviewed the need for regular dosing of his medications- Symbicort160-2spBid, Spiriva respimat- 2sp once daily, Mucinex600-1to2Bid w/ fluids, and the ProairHFA as needed;  He certainly has a lot on him w/ stomach cancer (s/p chemorx & considering surg), PAD- s/p fem-fem bypass, other vasc dis, wt loss/ poor nutrition, etc...  10/22/17>   He has done reasonably well over the last 13yr w/ his severe COPD/emphysema, despite his  continued smoking; He also did reasonably well w/ his major vascular procedure 04/2017; I suspect that his longevity-limiting disease process currently is his cancer diagnosis, but his COPD will surely impact his operative risk & risk of complications (ASA-4); For his part- he wants to proceed w/ the surgery (he is ECOG=1); Given his progression to GOLD Stage 4 COPD, his hypoxemic resp failure, and the fact that he doesn't even use his inhalers unless he gets samples-- we discussed the option of switching to a Home NEB machine w/ Duoneb Qid + Symbicort160-2spBid & Spiriva once daily; plus OTC MUCUS RELIEF 40106m 2tabs tid w/ fluids; plus a course of oral PREDNISONE 2060mid x2wks then one tab qam til return in 3-4wks before given the OK for Surg.   11/15/17>   I have asked Bobby Caldwell to cut back further on his smoking & work on cessation but he frankly said he would not be able to do any better than this!  He agreed to continue the DUONEB Qid, & cut the Prednisone to 106m13mm;  He is asked to wear his oxygen at 1L/min Qhs and 2L/min w/ activity but I am skeptical of his compliance;  we provided him w/ another months worth of Symbicort160 & Spirva Respimat...  I will contact DrByerly regarding his upcoming surg. 12/20/17>   We reviewed Bobby Caldwell's pulm management- refilled Duoneb, Symbicort, Spiriva respimat, & advised on O2 at 1L/min Qhs & 2L/min w/ activity;  We reviewed benefits of exercise program- but he wants handicap sticker,  and we gave him our congrats on not smoking; He will maintain f/u w/ DrByerly, DrJacobs, DrFeng...  01/22/18>   His resp status is stable but I am concerned about medication compliance & his risk to escalate smoking in the coming weeks & months- we discussed both issues;  He is still not able to eat by mouth & GI deferred to [facility] & plans f/u in 4-6 weeks;  We are ontinuing to try & get him a better port O2 system to support his need for exercise program... We will recheck pt in 6wks & sooner if  needed for resp problems.   Plan:     Home OXYGEN at 2L/min w/ activity & 1L/min Qhs...  Patient's Medications  New Prescriptions   No medications on file  Previous Medications   ACETAMINOPHEN (TYLENOL) 325 MG TABLET    Take 2 tablets (650 mg total) by mouth every 6 (six) hours as needed for mild pain (or Fever >/= 101).   ALBUTEROL SULFATE (PROAIR RESPICLICK) 734 (90 BASE) MCG/ACT AEPB    Inhale 2 puffs into the lungs every 6 (six) hours as needed.   AMINO ACIDS-PROTEIN HYDROLYS (FEEDING SUPPLEMENT, PRO-STAT SUGAR FREE 64,) LIQD    Place 30 mLs into feeding tube daily.   ATORVASTATIN (LIPITOR) 10 MG TABLET    Take 1 tablet (10 mg total) by mouth daily.   BUDESONIDE-FORMOTEROL (SYMBICORT) 160-4.5 MCG/ACT INHALER    Inhale 2 puffs into the lungs 2 (two) times daily.   CLOPIDOGREL (PLAVIX) 75 MG TABLET    Take 1 tablet (75 mg total) by mouth daily.   DRONABINOL (MARINOL) 2.5 MG CAPSULE    Take 1 capsule (2.5 mg total) by mouth 2 (two) times daily before a meal.   FEEDING SUPPLEMENT, ENSURE ENLIVE, (ENSURE ENLIVE) LIQD    Take 237 mLs by mouth 2 (two) times daily between meals.   METOCLOPRAMIDE (REGLAN) 5 MG TABLET    Take 1 tablet (5 mg total) by mouth 3 (three) times daily before meals.   NICOTINE (NICODERM CQ - DOSED IN MG/24 HOURS) 14 MG/24HR PATCH    Place 2 patches (28 mg total) onto the skin daily.   NUTRITIONAL SUPPLEMENTS (FEEDING SUPPLEMENT, OSMOLITE 1.5 CAL,) LIQD    Place 1,000 mLs into feeding tube daily.   ONDANSETRON (ZOFRAN) 8 MG TABLET    Take 1 tablet (8 mg total) by mouth 2 (two) times daily as needed for refractory nausea / vomiting. Start on day 3 after chemotherapy.   OXYCODONE-ACETAMINOPHEN (ROXICET) 5-325 MG TABLET    Take 1 tablet by mouth every 6 (six) hours as needed for severe pain.   PANTOPRAZOLE (PROTONIX) 40 MG TABLET    Take 1 tablet (40 mg total) by mouth daily at 12 noon.   POLYETHYLENE GLYCOL (MIRALAX / GLYCOLAX) PACKET    Take 17 g by mouth daily.    PROCHLORPERAZINE (COMPAZINE) 10 MG TABLET    Take 1 tablet (10 mg total) by mouth every 6 (six) hours as needed (Nausea or vomiting).   SENNA-DOCUSATE (SENOKOT-S) 8.6-50 MG TABLET    Take 1 tablet by mouth 2 (two) times daily.   TIOTROPIUM BROMIDE MONOHYDRATE (SPIRIVA RESPIMAT) 2.5 MCG/ACT AERS    Inhale 2 puffs into the lungs daily.   TRAMADOL (ULTRAM) 50 MG TABLET    Take 1 tablet (50 mg total) by mouth every 6 (six) hours as needed for moderate pain.  Modified Medications   Modified Medication Previous Medication   IPRATROPIUM-ALBUTEROL (DUONEB) 0.5-2.5 (3) MG/3ML SOLN ipratropium-albuterol (DUONEB) 0.5-2.5 (3) MG/3ML SOLN      Take 3 mLs by nebulization 4 (four) times daily.    Take 3 mLs by nebulization 4 (four) times daily.   MEGESTROL (MEGACE) 40 MG/ML SUSPENSION megestrol (MEGACE) 40 MG/ML suspension      Take  5 mLs (200 mg total) by mouth 2 (two) times daily.    Take 5 mLs (200 mg total) by mouth 4 (four) times daily.  Discontinued Medications   No medications on file

## 2018-03-12 ENCOUNTER — Ambulatory Visit: Payer: Medicaid Other | Admitting: Pulmonary Disease

## 2018-03-13 ENCOUNTER — Encounter: Payer: Self-pay | Admitting: Pulmonary Disease

## 2018-03-13 ENCOUNTER — Telehealth: Payer: Self-pay | Admitting: Pulmonary Disease

## 2018-03-13 ENCOUNTER — Ambulatory Visit (INDEPENDENT_AMBULATORY_CARE_PROVIDER_SITE_OTHER): Payer: Medicaid Other | Admitting: Pulmonary Disease

## 2018-03-13 VITALS — BP 122/68 | HR 123 | Temp 98.1°F | Ht 65.0 in | Wt 130.8 lb

## 2018-03-13 DIAGNOSIS — F17210 Nicotine dependence, cigarettes, uncomplicated: Secondary | ICD-10-CM

## 2018-03-13 DIAGNOSIS — C163 Malignant neoplasm of pyloric antrum: Secondary | ICD-10-CM

## 2018-03-13 DIAGNOSIS — R110 Nausea: Secondary | ICD-10-CM

## 2018-03-13 DIAGNOSIS — J432 Centrilobular emphysema: Secondary | ICD-10-CM | POA: Diagnosis not present

## 2018-03-13 DIAGNOSIS — Z9889 Other specified postprocedural states: Secondary | ICD-10-CM

## 2018-03-13 DIAGNOSIS — I70213 Atherosclerosis of native arteries of extremities with intermittent claudication, bilateral legs: Secondary | ICD-10-CM

## 2018-03-13 DIAGNOSIS — J9611 Chronic respiratory failure with hypoxia: Secondary | ICD-10-CM

## 2018-03-13 DIAGNOSIS — E440 Moderate protein-calorie malnutrition: Secondary | ICD-10-CM

## 2018-03-13 DIAGNOSIS — R636 Underweight: Secondary | ICD-10-CM

## 2018-03-13 MED ORDER — TIOTROPIUM BROMIDE MONOHYDRATE 2.5 MCG/ACT IN AERS: 2.0000 | INHALATION_SPRAY | Freq: Every day | RESPIRATORY_TRACT | 6 refills | Status: DC

## 2018-03-13 MED ORDER — ALBUTEROL SULFATE 108 (90 BASE) MCG/ACT IN AEPB: 2.0000 | INHALATION_SPRAY | Freq: Four times a day (QID) | RESPIRATORY_TRACT | 6 refills | Status: DC | PRN

## 2018-03-13 MED ORDER — IPRATROPIUM-ALBUTEROL 0.5-2.5 (3) MG/3ML IN SOLN: 3.0000 mL | Freq: Four times a day (QID) | RESPIRATORY_TRACT | 4 refills | Status: DC

## 2018-03-13 MED ORDER — BUDESONIDE-FORMOTEROL FUMARATE 160-4.5 MCG/ACT IN AERO: 2.0000 | INHALATION_SPRAY | Freq: Two times a day (BID) | RESPIRATORY_TRACT | 6 refills | Status: DC

## 2018-03-13 NOTE — Telephone Encounter (Signed)
Phoned pharmacy made aware meds had been sent under MD Sood. Nothing further needed at this time.

## 2018-03-13 NOTE — Progress Notes (Addendum)
Subjective:     Patient ID: Bobby Caldwell, male   DOB: 07-24-1955, 62 y.o.   MRN: [id number]  HPI  ~  February 16, 2015:  Initial pulmonary consult by SN>   22 y/o gentleman from [place], referred by Minette Brine NP at [facility] (Dr. Glendale Chard), for pulmonary evaluation due to emphysema>      Bobby Caldwell has been a heavy smoker- started at age 63 y/o when they would "roll your own" & smoked continually up to 2ppd for ~62yr before decr to 1ppd more recently, est ~70-80 pack-yr smoking hx... He c/o SOB/ DOE, notes mild cough, sm amt beige sput, no hemptysis, occas sharp CP & palpit, no edema...  He denies signif hx of active lung problems- w/o recurrent bronchitic infections, pneumonia, Tb or known exposure, asthma, etc;  Neither has he had any hx signif cardiac problems- w/o known CAD, CHF, etc... He denies occupational exposures to asbestos, silica, mining, etc...      Bobby Caldwell also notes weight loss- states that his regular weight is ~150# & he currently weighs 113#, 5'7" Tall, BMI=17-18; appetite is poor & is rec to add nutritional supplements Bid betw meals...      Current Meds> NONE- he works at [employer] are too $$ for him... EXAM showed Afeb, VSS, O2sat=97% on RA;  HEENT- neg, mallampati1;  Chest- decr BS bilat w/ sl end-exp wheezing;  Heart- RR w/o m/r/g;  Abd- soft, neg;  Ext- w/o c/c/e;  Neuro- no focal neuro deficits...  CXR 01/15/15 showed hyperinflation, emphysema, NAD...  Spirometry 02/16/15 showed FVC=2.66 (61%), FEV1=1.25 (36%), %1sec=47, mid-flows reduced at 19% predicted... This is c/w severe airflow obstruction and GOLD Stage 3 COPD.  Ambulatory oxygen saturation test 02/16/15 showed O2sat=98% on RA at rest w/ pulse=86/min; he ambulated 3 laps w/ lowest O2sat=98% w/ HR=111/min...  [facility] & scanned into EPIC...   CT Chest w/o contrast> pending (see below) IMP/PLAN>>  Bobby Caldwell has been a life long smoker & has paid a price for  this behavior w/ severe emphysema; despite still working at [employer]/ 4 daughters to support- he continues to smoke 1ppd @$5 per pack= #150/mo & I have appealed to his parental instincts to quit smoking completely but he is not optimistic that he will be able to do so & unwilling to purchase smoking cessation aides like Chantix, Nicotine replacement, etc... In addition to smoking cessation we have prescribed SYMBICORT160- 2spBid, & SPIRIVA daily, along w/ ProairHFA rescue inhaler for prn use (coupons given taking advantage of all known discount programs)... For completeness I have rec a CT Chest (esp in light of his wt loss) and his PCP may want to consider CT Abd & Pelvis as well... He is rec to start eating better, 3 meals/day, plus nutritional supplements Bid;  Finally he needs the full benefits for avail immunization programs=> Prevnar-13, Pneumovax-23, Flu shots, etc (we will leave this to his PCP to administer)... Rec ROV recheck in 6 weeks. ADDENDUM>>  CT Chest 02/22/15 showed severe centrilobular emphysema, no adenopathy, several small pulm nodules- largest is 643min LUL & RML... rec f/u CT in 32m632mo~  March 30, 2015:  6wk ROV w/ SN>        Bobby Caldwell reports that he is improved on the Symbicort160-2spBid, Spiriva daily, and the AlbutHFA prn (hasn't needed);  He notes that he's decr the smoking to 7-10 cig/d now & we discussed the need to quit  completely;  He notes min cough, no sputum or hemoptysis, stable SOB/DOE, no CP/ palpit/ or edema...  EXAM showed Afeb, VSS, O2sat=95% on RA;  HEENT- neg, mallampati1;  Chest- decr BS bilat w/ sl end-exp wheezing;  Heart- RR w/o m/r/g;  Abd- soft, neg;  Ext- w/o c/c/e;  Neuro- no focal neuro deficits... IMP/PLAN>>  Bobby Caldwell is improved on the inhalers and we provided him w/ some samples and Rx but it is unclear if he will be able to afford them- discount cards & drug company programs have been provided... We plan ROV 3-4 months...  ~ August 02, 2015: 655moROV  w/ SN>   Bobby Caldwell returns, unaccompanied for today's visit, states his breathing is unchanged- c/o DOE w/ min activ, cough w/ yellow sput; he denies CP, hemoptysis, edema; he is still able to perform ADLs but SOB w/ walking/ stairs/ etc; he reports that he has decreased his smoking from 2.5ppd to 1ppd at present; ?if he is really using his [place] daily? He admits to running out of his AlbutHFA rescue inhaler...  Severe COPD/ Emphysema> He has evid for severe airflow obstruction, GOLD Stage3 COPD/ Emphysema, no desaturation w/ exercise, CTChest 02/2015 showed severe centrilob emphysema & several small nodules- largest 663min LUL & RML; On Symbicort160-2spBid & Spiriva Respimat-2sp once daily, and AlbutHFA rescue inhaler as needed...  Cigarette smoker> He has been smoking regularly since age [age]est 80+pack-yr smoking hx, he is proud that he has decr fro 2.5ppd to 1ppd currently; we discussed this & I stressed the need to quit completely...  Weight loss> He says his norm wt was ~150# and he has lost down to 114#; we reviewed nutrition, advised Ensure/ Sustacal/ Boost daily... EXAM showed Afeb, VSS, O2sat=95% on RA; HEENT- neg, mallampati1; Chest- decr BS bilat w/ sl end-exp wheezing; Heart- RR w/o m/r/g; Abd- soft, neg; Ext- w/o c/c/e; Neuro- no focal neuro deficits... IMP/PLAN>> Pt has severe COPD/emphysema & still smoking 1ppd- must quit all smoking & take his Symbicort/ Spiriva regularly & AlbutHFA as needed; He can use MUCINEX600- 2Bid w/ fluids for thick phlegm. We plan ROV 55m555mowill plan f/u CTChest at that time...  ~  January 10, 2016:  55mo51mo w/ SN>     Bobby Caldwell is here today w/ his friend & neighbor "like my nephew" named [name] phone #[phone] is still smoking 1ppd and can't vs won't quit, not motivated despite my harsh warnings and the fact that he has 3 daughters;  He remains SOB w/ min activity &  ADLs and estimates that it's been ~76yrs54yrce he could do ADLs w/o the dyspnea;  Notes mild cough, sm amt thick greenish sput, no hemoptysis; notes sl chest discomfort but no severe pain or pleurisy, no f/c/s, no real COPD exac in the interval;  He also reports poor appetite & not eating well, weight is down 5# to 109# today... He informs me that [facility]- sched to ret 8/28...  We reviewed the following medical problems during today's office visit >>   Severe COPD/ Emphysema> He has evid for severe airflow obstruction, GOLD Stage3 COPD/ Emphysema, no desaturation w/ exercise, CTChest 02/2015 showed severe centrilob emphysema & several small nodules- largest 55mm i22mUL & RML; On Symbicort160-2spBid & Spiriva Respimat-2sp once daily, and AlbutHFA rescue inhaler as needed;  SOB w/ ADLs at baseline, he has not had any signif resp  exac in the interval...    Bilat small pulm nodules on CT chest 02/2015 + centrilobular emphysema>  He is due for f/u CT Chest to assertain if any of the lesions has grown in the interim...  Cigarette smoker> He has been smoking regularly since age 80, est 80+pack-yr smoking hx, he is proud that he has decr fro 2.5ppd to 1ppd currently; we discussed this & I stressed the need to quit completely...  Weight loss/ underweight/ poor appetite> He says his norm wt was ~150# and he has lost down to 109#; we reviewed nutrition, advised Ensure/ Sustacal/ Boost daily... EXAM showed Afeb, VSS, O2sat=94% on RA; Wt=109#, 5'7"Tall, BM17;  HEENT- neg, mallampati1; Chest- decr BS bilat w/ sl end-exp wheezing; Heart- RR w/o m/r/g; Abd- soft, neg; Ext- w/o c/c/e; Neuro- no focal neuro deficits...  Spirometry 01/10/16>  FVC=2.93 (69%), FEV1=1.03 (31%), %1sec=35%, mid-flows reduced at 16% predicted... This is c/w severe airflow obstruction & GOLD Stage 3-4 COPD  Ambulatory Oximetry 01/10/16>  O2sat=95% on RA at rest w/ pulse=99/min;  He ambulated 3 laps w/  lowest O2sat=95% w/ pulse=99/min; no desaturations...   CTChest >> DONE 01/17/16> norm heart size, atherosclerotic changes in Ao & LAD, no adenopathy, mod centrilob emphysema w/ diffuse bronchial wall thickening, bilat calcif granuloma, 77m RML & LUL nodules + several smaller bilat nodules- NO CHANGES from 2016, no new lesions, & no consolidative airsp dis... We plan repeat CT in 147yr. IMP/PLAN>>  Bobby Caldwell understand that he must quite all smoking immediately; his FEV1 has deteriorated 18% in the last yr down to 1.03L;  His oxygenation remains stable w/o desat;  He is asked to quit all smoking, stay on the Symbicort160-2spBid, spiriva Respimat- 2sp once daily, and the ProairHFA rescue inhaler prn;  He is again asked to start nutritional supplements w/ Ensure/ Sustacal/ Boost at least bid; he understands that w/o smoking cessation that his prognosis is very poor;  He works at [employer] asking about disability- I supported his application for this & explained the diff betw disability from a work poActuarys SSI;  His f/u CT Chest is pending...  ~  March 26, 2017:  1449moV & Bobby Caldwell returns after a long hiatus looking very gaunt & weak-- he has lost more weight & now weighs 100#, ~5'7"Tall, BMI=16; and still smoking daily- he admits to 3/4 ppd but has no interest in quitting smoking w/ mult excuses why HE cannot quit... His CC today is weakness & inability to walk w/ leg pain which is worse than his SOB/DOE from his emphysema... He is [place] little english-- we used a traOptometristdy over the speaker phone to communicate w/ him & this was quite satisfactory (call interpreter line at [phone] set this up & if he is at home a 3-way conf call can be set up using his home phone #[phone].    We reviewed the following medical problems during today's office visit> he has not had any medical attention since he was here last- no PCP, no known ER visits, etc...  Severe COPD/ Emphysema>  severe airflow obstruction, GOLD Stage3-4 COPD/ Emphysema, no desaturation w/ exercise, CTChest 02/2015 showed severe centrilob emphysema & several small nodules- largest 6mm83m LUL & RML (no change serially); On Symbicort160-2spBid & Spiriva Respimat-2sp once daily, Mucinex and AlbutHFA rescue inhaler as needed;  SOB w/ ADLs at baseline x yrs, he has not had any signif resp exac in the interval, he says he is more lim by his  leg symptoms...    Bilat small pulm nodules on CT chest 02/2015 + centrilobular emphysema> small bilat lung nodules are unchanged serial w/ CT scans in 2016, 2017, 2018...  Cigarette smoker> He has been smoking regularly since age 49, est 80+pack-yr smoking hx, he is proud that he has decr from 2.5ppd to 1ppd & will try to decr to 1/2ppd; we discussed this & I stressed the need to quit completely!    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  Weight loss/ underweight/ poor appetite> He says his norm wt was ~150# and he has lost down to 100#; we reviewed nutrition, advised 3 meals + Ensure/ Sustacal/ Boost daily... EXAM showed Afeb, VSS w/ BP=150/92, O2sat=96% on RA at rest; Wt=100#, 5'7"Tall, BMI=16;  HEENT- neg, mallampati1; Chest- decr BS bilat w/ prolonged exp phase & congested cough; Heart- RR w/o m/r/g; Abd- thin, soft, sl tender to palp +abd bruits; Ext- w/o c/c/e but pulses are diminished distally + left femoral bruit; Neuro- no focal neuro deficits...  CXR 03/26/17 (independently reviewed by me in the PACS system)> he did not go to our [place] for this requested CXR...  Ambulatory Oximetry on RA 03/26/17> O2sat=96% on RA at rest w/ pulse=105/min;  He ambulated only 2 laps in  the office (185'ea) w/ lowest O2sat=96% w/ pulse=107/min;  He stopped due to leg weakness & pain...  LABS 03/26/17>  Chems- wnl w/ Cr=0.59, norm LFTs;  CBC- wnl w/ Hg=16.3;  TSH=0.59;  BNP=25;  Sed=2...  CT Chest/ Abd/ Pelvis 04/02/17> norm heart size, aortic atherosclerosis but ?no coronary calcif noted; no adenopathy & esoph is ok; diffuse bronchial wall thickening & mod centrilob+paraseptal emphysema; mult small pulm nodules scat bilaterally similar & w/o change from prev scans 2016&2017 (largest RML= 6x30m), no suspicious masses or consolidative airsp dis;  In the Abd/Pelvis small calcif granuloma in liver, otherw neg hepatobiliart tree, GB looks ok, pancreas ok, adrenals/ kidneys/ bladder- all ok; Aortic atherosclerosis w/ fusiform ectasia of infrarenal AA up to 2.6x2.3cm w/ abundant mural thrombus & complete occlusion of the right common iliac art & reconstitution of flow distally, +severe stenosis of prox left common iliac art; prostate ok & no bone lesions identified...  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) and is still smoking w/ a 70-80 pack yr hx, proud of the fact that he has cut back by a few cigarettes, refuses Chantix etc;  Asked to quit completely ASAP (says he'll try to decr to 10 cig/d) & continue resp treatments w/ Symbicort160-2spBid, Spiriva Respimat-2sp daily, Mucinex & Proair;  He is most concerned about his progressive leg symptoms=> clearly related to his severe peripheral vasc dis (ectatic distal Ao w/ mural thrombus, bilat common iliac dis, etc) and he is in need of ASAP eval by [facility] where arteriography will hopefully point to bypass surg that will help his symptoms;  In addition his weight loss & prot-cal malnutrition is alarming- thankfully his labs are essentially wnl & we reviewed nutritional supplements w/ ensure/ sustacal/ carnation instant/ etc...   NOTE:  >50% of this 582m appt was spent in counseling &  coordination of care  Appt w/ [facility] set  up for 04/27/17 at 11:00AM w/ DrChen  ADDENDUM>> ArtDopplers done 04/19/17>  c/w severe arterial disease R>L w/ abn toe-brachial indices as well => he has appt w/ [facility] on 10/19 at 11:00AM..Marland Kitchen  ~  September 04, 2017:  5 month ROV & pulmonary follow up visit>  A lot has happened to Bobby Caldwell in the last 5 months>   CT Abd/Pelvis & ArtDopplers of LEs 9-04/2017 confirmed severe atherosclerotic vasc dis w/ ectasia of infrarenal Ao w/ mural thrombus, an occluded R-iliac art & hi-grade L-common iliac stenosis, ABI's were 0.46R & 0.53L...  DrChen did Aortogram & subseq L=>R Fem-Fem bypass graft 04/2017, pt disch on Plavix & Lipitor added...  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  Oncology consult DrFeng11/2018 w/ decision for FOLFOX chemorx, started 07/06/17  Cardiology eval by DrHarding 06/2017 w/ atypical CP & known PAD; they did Echo/ Myoview (plan for CT Coronary Angio was cancelled)...  His PCP is now Minette Brine     Bobby Caldwell returns now after the 31mohiatus for pulmonary recheck> known severe COPD, hx heavy smoker & still smoking 1/2ppd (he readily admits that he will never stop smoking despite the cost to his health & his family);  His last spirometry showed FEV1=1.03L (31% predicted) and we have been treating him w/ SYMBICORT160-2spBid, Spiriva Respimat- 2sp once daily, Mucinex60115m1-2Bid w/ fluids, and ProairHFA rescue inhaler as needed;  His compliance has been poor & costs may be a factor but he is still smoking!!!  He has NOT required Oxygen to this point as his ambulatory O2 sat tests have not shown desaturations w/ his limited activity...  He had some chest discomfort w/ incr SOB recently & called 911 => EMS came to his house, gave him some oxygen & he felt better, refused t go to the ER, he thinks the O2 made him better & wants  some- I explained how this works, how he must qualify if he wants insurance to cover this (unless he wants to self pay which he cannot afford) & we discussed eval w/ EKG, 2DEcho, another Ambulatory O2 test    We reviewed the following medical problems during today's office visit>   Severe COPD/ Emphysema> severe airflow obstruction, GOLD Stage3-4 COPD/ Emphysema, no desaturation w/ exercise until today, CTChest 02/2015 showed severe centrilob emphysema & several small nodules- largest 15m9mn LUL & RML (no change on serial scans); On Symbicort160-2spBid & Spiriva Respimat-2sp once daily, Mucinex and AlbutHFA rescue inhaler as needed;  SOB w/ ADLs at baseline x yrs, he has not had any signif resp exac in the interval, he says he was more lim by his leg symptoms...    Bilat small pulm nodules on CT chest 02/2015 + centrilobular emphysema> small bilat lung nodules are unchanged serial w/ CT scans in 2016, 2017, 2018...  Cigarette smoker> He has been smoking regularly since age 61, 53st 80+pack-yr smoking hx, he is proud that he has decr from 2.5ppd to 1ppd & will try to decr to 1/2ppd; we discussed this & I stressed the need to quit completely!    ASPVD> he has claudication & vasc insuffic => CT Abd w/ atherosclerotic changes=> fusiform ectasia of infrarenal AA up to 2.6x2.3cm w/ abundant mural thrombus & complete occlusion of the right common iliac art & reconstitution of flow distally, +severe stenosis of prox left common iliac art;  Pt referred to [facility]  w/ abn ABIs bilat & subseq L=>R Fem-Fem bypass graft 04/2017, pt disch on Plavix & Lipitor added...  Weight loss/ underweight/ poor appetite => ADENOCARCINOMA of the Stomach> He says his norm wt was ~150# and he has lost down to 100#; we reviewed nutrition, advised 3 meals + Ensure/ Sustacal/ Boost daily; Pt referred to GI-DrJacobs w/ EGD showing Adenocarcinoma of the stomach => referred to Oncology- DrFeng for chemorx & Surg- DrByerly... EXAM showed Afeb,  VSS w/ BP=116/80, O2sat=95% on RA at rest; Wt=118#, 5'7"Tall, BMI=18;  HEENT- neg, mallampati1; Chest- decr BS bilat w/ prolonged exp phase & congested cough; Heart- RR w/o m/r/g; Abd- thin, soft, sl tender to palp +abd bruits; Ext- w/o c/c/e but pulses are diminished distally + left femoral bruit; Neuro- no focal neuro deficits...  Ambulatory Oxygen Test>  O2sat=95% on RA at rest w/ pulse=64/min;  He ambulated just one lap in the office ([place] w/ nadir O2sat=85% w/ pulse=105/min... HE NOW QUALIFIES FOR PORTABLE O2 SYSTEM.Marland KitchenMarland Kitchen  LABS 09/03/17>  Chems- lytes- ok , Cr=0.56, ALB=2.6;  CBC- Hg=13.6, wbc=9.8.Marland KitchenMarland Kitchen  IMP/PLAN>>  I again implored Bobby Caldwell to quit all smoking but he is not moved- indicates that 10cig/d is not that bad & he will not be able to cut this down further;  He now qualifies for Home O2 & we will set him up w/ home oxygen at 1L/min Qhs and 2L/min w/ activity (he indicated that he would only use it when he felt he needed it for SOB);  Finally we reviewed the need for regular dosing of his medications- Symbicort160-2spBid, Spiriva respimat- 2sp once daily, Mucinex600-1to2Bid w/ fluids, and the ProairHFA as needed;  He certainly has a lot on him w/ stomach cancer (s/p chemorx & considering surg), PAD- s/p fem-fem bypass, other vasc dis, wt loss/ poor nutrition, etc...   ~  October 22, 2017:  11moROV & pt is sent back for Pre-op eval by DrByerly>  Unfortunately the pt continues to smoke ~6cig/d & demonstates deterioration in his Spirometry parameters (see below); he says that this is the best he can do and he will not be able to quit, not interested in trying further, declines Chantix etc; on further questioning he doesn't quite understand why he can't get a lung transplant 1st, then proceed w/ the stomach cancer surgery and I took the time to explain this to him & his wife... I reviewed the following interval medical records in Epic>      He was HOSP by [facility] 3/4 - 3/6 19 w/ COPD exac, acute on chr  hypoxemic resp failure, no organism ident & he responded to Rocephin, Zithromax, Solumedrol, NEBS, oxygen, etc;  XRays, cultures, Labs- reviewed...     He saw XRT- DrMoody on 09/26/17>  Note reviewed, surgery is considered optimal for his cancer, but XRT w/ concurrent ChemoRx is offered if surg is ruled out...     He saw ONCOLOGY- LBurton,NP on 10/08/17>  Note reviewed, they plan to offer concurrent chemoRx w/ Xeloda BID M=>F on days of his XRT w/ DrMoody only if he is determined to NOT be a surg candidate...     She saw SURG- DrByerly on 10/09/17> f/u gastic cancer w/ hx severe COPD & severe PAD (s/p L=>R Fem-Fem bypass 10/18 by DrChen); s/p neoadjuvant chemotherapy w/ DrFeng finished 09/2017;  Note reviewed- DrB wants assessment of operative risk- see below... We reviewed the following medical problems during today's office visit>    Severe COPD/ Emphysema> severe airflow obstruction, GOLD Stage3-4 COPD/ Emphysema, &  exercise induced hypoxemia, CTChest 02/2015 showed severe centrilob emphysema & several small nodules- largest 101m in LUL & RML (no change on serial scans); On Symbicort160-2spBid & Spiriva Respimat-2sp once daily, Mucinex and AlbutHFA rescue inhaler as needed;  SOB w/ ADLs at baseline x yrs, prev more lim in activity by his claudication, he was ADM w/ COPD exac 09/2017 & improved w/ standard therapy...    Bilat small pulm nodules on CT chest 02/2015 + centrilobular emphysema> small bilat lung nodules are unchanged serial w/ CT scans in 2016, 2017, 2018...  Cigarette smoker> He has been smoking regularly since age 62 est 80+pack-yr smoking hx, he is proud that he has decr from 2.5ppd to 1ppd & then down to 1/2ppd; we discussed this & I stressed the need to quit completely!    ASPVD> he has claudication & vasc insuffic => CT Abd w/ atherosclerotic changes=> fusiform ectasia of infrarenal AA up to 2.6x2.3cm w/ abundant mural thrombus & complete occlusion of the right common iliac art &  reconstitution of flow distally, +severe stenosis of prox left common iliac art;  Pt referred to [facility] w/ abn ABIs bilat & subseq L=>R Fem-Fem bypass graft 04/2017, pt disch on Plavix & Lipitor added...  Weight loss/ underweight/ poor appetite => ADENOCARCINOMA of the Stomach> He says his norm wt was ~150# and he has lost down to 100#; we reviewed nutrition, advised 3 meals + Ensure/ Sustacal/ Boost daily; Pt referred to GI-DrJacobs w/ EGD showing Adenocarcinoma of the stomach => referred to Oncology- DrFeng for chemorx & Surg- DrByerly... EXAM showed Afeb, VSS w/ BP=124/62, O2sat=95% on 2L/min; Wt=121#, 5'7"Tall, BMI=18;  HEENT- neg, mallampati1; Chest- decr BS bilat w/ prolonged exp phase & congested cough; Heart- RR w/o m/r/g; Abd- thin, soft, sl tender to palp +abd bruits; Ext- w/o c/c/e but pulses are diminished distally + left femoral bruit; Neuro- no focal neuro deficits...  CXR 09/11/17>  Norm heart size, COPD, emphysema, bibasilar atelectasis, otherw clear lungs, boney demineralization, port-a-cath on right...  CT Angio Chest 09/10/17>  NEG for PE, R-IJ port-a-cath, no pathologically enlarged nodes, mod to severe centrilob emphysema, scattered sm nodules and granulomas...   Spirometry 10/22/17> (fair tracing) FVC=1.79 (44%), FEV1=0.91 (29%), %1sec=51, mid-flows reduced at 10% of the predicted norm... now indicative of GOLD Stage 4 COPD.  LABS 10/2017 in Epic>  Chems- wnl;  CBC- wnl IMP/PLAN>>  He has done reasonably well over the last 325yrw/ his severe COPD/emphysema, despite his continued smoking; He also did reasonably well w/ his major vascular procedure 04/2017; I suspect that his longevity-limiting disease process currently is his cancer diagnosis, but his COPD will surely impact his operative risk & risk of complications (ASA-4); For his part- he wants to proceed w/ the surgery (he is ECOG=1); Given his progression to GOLD Stage 4 COPD, his hypoxemic resp failure, and the fact that he  doesn't even use his inhalers unless he gets samples-- we discussed the option of switching to a Home NEB machine w/ Duoneb Qid + Symbicort160-2spBid & Spiriva once daily; plus OTC MUCUS RELIEF 40010m2tabs tid w/ fluids; plus a course of oral PREDNISONE 70m66md x2wks then one tab qam til return in 3-4wks before given the OK for Surg...   ~  Nov 15, 2017:  3wk ROV & pulmonary follow up visit>  DaneLaneta Simmersmuch improved since we started a home NEBULIZER w/ DUONEB Qid and PREDNISONE (70mg3m x1wk & 70mg 14mafter that);  He has also continued the Symbicort160-2spBid &  Spiriva Respimat-2sp once daily via samples from our office;  He has Home O2 at 2L/min [place] et he continues to smoke 6-10 cig/d!!!  Today's exam is much improved w/ resolution of his congestion/ rhonchi/ wheezing => I suspect he is about as good as he is going to be and this is likely his optimal time to proceed w/ the life-saving abdominal surg for his gastric carcinoma...  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    Bilat small pulm nodules on CT chest 02/2015 + centrilobular emphysema> small bilat lung nodules are unchanged serial w/ CT scans in 2016, 2017, 2018...  Cigarette smoker> He has been smoking regularly since age 62 est 80+pack-yr smoking hx, he is proud that he has decr from 2.5ppd to 1ppd & then down to  1/2ppd; we discussed this & I stressed the need to quit completely!    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  Weight loss/ underweight/ poor appetite => ADENOCARCINOMA of the Stomach> He says his norm wt was ~150# and he has lost down to 100#; we reviewed nutrition, advised 3 meals + Ensure/ Sustacal/ Boost daily; Pt referred to GI-DrJacobs w/ EGD showing Adenocarcinoma of the stomach => referred to Oncology- DrFeng for chemorx & Surg- DrByerly... EXAM showed Afeb, VSS w/ BP=132/82, O2sat=88% on RA & 96% on 2L/min; Wt up to 131 #, 5'7"Tall, BMI=20;  HEENT- neg, mallampati1; Chest- clear w/ decr BS bilat w/ prolonged exp phase; Heart- RR w/o m/r/g; Abd- thin, soft, sl tender to palp +abd bruits; Ext- w/o c/c/e but pulses are diminished distally + left femoral bruit; Neuro- no focal neuro deficits...  Spirometry 11/15/17>  We attempted repeat spirometry but his technique was very poor & results unreliable... IMP/PLAN>>  I have asked Bobby Caldwell to cut back further on his smoking & work on cessation but he frankly said he would not be able to do any better than this!  He agreed to continue the DUONEB Qid, & cut the Prednisone to '10mg'$  Qam;  He is asked to wear his oxygen at 1L/min Qhs and 2L/min w/ activity but I am skeptical of his compliance;  we provided him w/ another months worth of Symbicort160 & Spirva Respimat...  I will contact DrByerly regarding his upcoming surg...  ~  December 20, 2017:  186moOV & post-hospital check>  Mr GuMandevillenderwent diagnostic laparoscopy, distal gastrectomy w/ B ll anastomosis, & J-tube placement by DrByerly on  11/27/17 and did very well!; he is receiving nighttime feeds via the tube, he has not  resumed smoking since the surgery!!! PATH showed mod diff AdenoCa 3.8cm, lymphovasc & perineural space invasion, 0/4 lymph nodes, no tumor in the omentum... Doesn't look like he'll need additional chemoRx per DrByerly.    He tells me he tried smoking 1 cig since dish- got N&V therefore stopped! Using nicoderm patches, still has his prev neoadjuvant chemorx porta-cath in place, getting J-tube feedings Qhs, if he eats (eg- rice or potato) after several hours he gets nauseated & vomits dark black fluid-; he has compazine '10mg'$  tabs & is taking Reglan '10mg'$  Tid per DrByerly, prev used Marinol at disch; He will f/u w/ DrJacobs and DrFeng... He is also taking Perccet5 & Tramadol.    Breathing is STABLE- on NEB w/ Duoneb Qid, Symbicort160-2spBid, Spiriva Resp- 2sp once daily, NicodermCQ- 14/24hr patch, AlbutHFA rescue prn; he was 86% on RA on arrival- placed O2 at 2L/min & incr to 94%... We discussed compliance w/ meds and he wants handicap sticker for car... We plan ROV in 3moto check progress... EXAM showed Afeb, VSS w/ BP=104/70, O2sat=86% on RA & 94% on 2L/min; Wt is 129 #, 5'7"Tall, BMI=20;  HEENT- neg, mallampati1; Chest- clear w/ decr BS bilat w/ prolonged exp phase; Heart- RR w/o m/r/g; Abd- scar of recent Lap surg, thin, soft, sl tender to palp +abd bruits; Ext- w/o c/c/e but pulses are diminished distally + left femoral bruit; Neuro- no focal neuro deficits...  CXR 12/02/17>  Atherosclerotic calcif in Ao, COPD/E w/ indistict band-like opac at lung bases (atelectasis)  ABG 11/27/17 in Hosp>  pH=7.42,  PCO2=44,  PO2=80  LABS 11/2017>  Chems- ok x BS~120-210;  CBC- Hg~11-12;  Blood type= Bpos IMP/PLAN>>  We reviewed Bobby Caldwell's pulm management- refilled Duoneb, Symbicort, Spiriva respimat, & advised on O2 at 1L/min Qhs & 2L/min w/ activity;  We reviewed benefits of exercise program- but he wants handicap sticker,  and we  gave him our congrats on not smoking; He will maintain f/u w/ DrByerly, DrJacobs, DrFeng...   ~  January 22, 2018:  154moOV & pulm/ medical recheck> Bobby Caldwell continues on tube feeds 2 pack Qhs & reports continued N & V after eating by mouth;  Breathing is at least stable- he has predictably restarted smoking again but quite proud of the fact that it is only 1-2 cigarettes per day now ("I'm not inhaling" he says) & is hopefully doing his meds as prescribed but he once again did not brink med bottles or med list to the OV today...    Since he was here last DrJacobs was contacted regarding the N&V and [facility] in 4-6 weeks...    Pt remains on O2 at 2L/min w/ activity & 1L/min at rest; NEB w/ Duoneb Qid, Symbicort160-2spBid, Spiriva Resp- 2sp once daily, NicodermCQ- 14/24hr patch, AlbutHFA rescue prn;  We are still trying to get him a better portable system thru his [employer]. EXAM showed Afeb, VSS w/ BP=130/76, O2sat=94% on RA & 94% on RA today; Wt is 131# (up 2lbs), 5'7"Tall, BMI=20;  HEENT- neg, mallampati1; Chest- clear w/ decr BS bilat w/ prolonged exp phase; Heart- RR w/o m/r/g; Abd- scar of recent Lap surg, thin, soft, sl tender to palp +abd bruits; Ext- w/o c/c/e but pulses are diminished distally + left femoral bruit; Neuro- no focal neuro deficits... IMP/PLAN>>  His resp status is stable but I am concerned about medication compliance & his risk to escalate smoking in the coming weeks & months- we discussed both issues;  He is  still not able to eat by mouth & GI deferred to [facility] & plans f/u in 4-6 weeks;  We are continuing to try & get him a better port O2 system to support his need for exercise program... We will recheck pt in 6wks & sooner if needed for resp problems.   ~  March 13, 2018:  6wk ROV & pulmonary follow up visit>  Since last visit his J-tube has been removed & his N/V resolved (off Marinol now), now eating well, gaining weight & feeling better;  His CC  now is his breathing, smoking 3 cig/d & he thinks this is OK, using nicoderm patch, only using his O2 "as needed" for SOB, no interval resp exacerbations... He is supposed to be on O2 at 2L/min w/ activity & 1L/min at rest; NEB w/ Duoneb Qid, Symbicort160-2spBid, Spiriva Resp- 2sp once daily, NicodermCQ- 14/24hr patch, AlbutHFA rescue prn... ?he stopped many of his meds- ?off Lipitor & Plavix- stopped on his own, asked to restart these important meds!  He still does NOT have a PCP to tend to his serious medical issues... 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     He saw [facility]- DrByerly 02/06/18>  F/u gastric cancer- s/p laparoscopy, distal gastrectomy, B-II & J-tube placement; he did well w/ night time tube feeds, J-tube removed this visit...    He saw GI- AmyEsterwood,PA on 02/12/18>  Hx adenoCa distal stomach/ pylorus Dx 05/2017; Rx w/ neoadjuvant chemotherapy by DrFeng completed 09/2017, then surg by DrByerly w/ distal gastectomy/ B-II in [id number] gastrojejunostomy tube for nighttime tube feeding, unable to eat by mouth due to N/V, lost 15 lbs, started on Megace & DrByerly removed his tube 01/2018... Now eating well, Megace really helping, gaining weight, not c/o n/v/ abd pain... Asked to wean the Megace slowly... F/u GI planned "prn"- returned to his PCP (and of course he has none)... 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    Bilat small pulm nodules on CT chest 02/2015 + centrilobular emphysema> small bilat lung nodules are unchanged serial w/ CT scans in 2016, 2017, 2018...  Cigarette smoker> He has been smoking regularly since age [age]est 80+pack-yr smoking hx, he is proud that he has decr from 2.5ppd to 1ppd & then down to 1/2ppd, now just 3 cig/d but still smoking despite all his serious medical issues we discussed this & I stressed the need to quit completely- he is using the Nicoderm patches, refuses Chantix.    ASPVD> he had severe claudication & vasc insuffic => CT Abd w/ atherosclerotic changes=> fusiform ectasia of  infrarenal AA up to 2.6x2.3cm w/ abundant mural thrombus & complete occlusion of the right common iliac art & reconstitution of flow distally, +severe stenosis of prox left common iliac art;  Pt referred to [facility] w/ abn ABIs bilat & subseq L=>R Fem-Fem bypass graft 04/2017, pt disch on Plavix & Lipitor added but he subseq stopped these on his own- then asked to re-start; Last Duplex imaging 01/2018 showed a 50-74% graft stenosis & repeat studies planned in 82mo..   Weight loss/ underweight/ poor appetite => ADENOCARCINOMA of the Stomach> He says his norm wt was ~150# and he lost down to 100#; we reviewed nutrition, advised 3 meals + Ensure/ Sustacal/ Boost daily; Pt referred to GI-DrJacobs w/ EGD showing Adenocarcinoma of the stomach => referred to Oncology- DrFeng for chemorx & Surg- DrByerly;  Rx w/ neoadjuvant chemotherapy by DrFeng completed 09/2017, [facility]/ B-II in [id number] J-tube removed end July 2019 & now eating better, gaining weight w/ Megace &  weaning off...    EXAM showed Afeb, VSS w/ BP=130/76, O2sat=94% on RA & 94% on RA today; Wt is 131# (up 2lbs), 5'7"Tall, BMI=20;  HEENT- neg, mallampati1; Chest- clear w/ decr BS bilat w/ prolonged exp phase; Heart- RR w/o m/r/g; Abd- scar of recent Lap surg, thin, soft, sl tender to palp +abd bruits; Ext- w/o c/c/e but pulses are diminished distally + left femoral bruit; Neuro- no focal neuro deficits... IMP/PLAN>>  We stressed the need to stay on his meds, not smoke, and increase exercise program- he is not inclined to do any of these things; he thinks smoking 3 cig/d is pretty good for him but I told him it is slowly killing him!  He stopped the Lipitor & Plavix thinking the bypass surg would fix him forever.  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  He needs to establish with a PCP to pick up on all these issues.  From the pulmonary standpoint we again reviewed the need for O2 at 2L/min w/ activity & 1L/min Qhs, DUONEB Qid, Symbicort160-2spBid & Spiriva Respimat-2sp once daily, Mucinex and AlbutHFA rescue inhaler as needed, plus no smoking & increase exercise program (consider pulm rehab if he will participate)...    Initially Referred by JMinette BrineNP at [facility] (Dr. RGlendale Chard --- PMHx, SurgHx, FamHx, and SocHx per PCP is reviewed... Limited English hampers medical history>  He is [place]& speaks little english-- we used a tOptometristover the speaker phone to communicate w/ him... (call interpretor line at [phone] set this up & if he is at home a 3-way conf call can be set up using his home phone #[phone] Alternatively-- pt can call his daughter on cell phone & she can translate...   Past Medical History:  Diagnosis Date  . COPD (chronic obstructive pulmonary disease) ([place]   . Dyspnea   . P[place] artery disease) ([place]   . Stomach cancer ([place]    . Tobacco abuse   . Weight loss     Past Surgical History:  Procedure Laterality Date  . ABDOMINAL AORTOGRAM W/LOWER EXTREMITY N/A 05/03/2017   Procedure: ABDOMINAL AORTOGRAM W/LOWER EXTREMITY;  Surgeon: CConrad [place] MD;  [facility];  Service: Cardiovascular;  Laterality: N/A;  . ESOPHAGOGASTRODUODENOSCOPY     05/24/17  . ESOPHAGOGASTRODUODENOSCOPY (EGD) WITH PROPOFOL N/A 05/24/2017   Procedure: ESOPHAGOGASTRODUODENOSCOPY (EGD) WITH  PROPOFOL;  Surgeon: [name], MD;  Location[facility];  Service: Endoscopy;  Laterality: N/A;  . EUS N/A 06/07/2017   Procedure: UPPER ENDOSCOPIC ULTRASOUND (EUS) RADIAL;  Surgeon: [name], MD;  [facility];  Service: Endoscopy;  Laterality: N/A;  . FEMORAL-FEMORAL BYPASS GRAFT Bilateral 05/08/2017   Procedure: LEFT TO RIGHT BYPASS GRAFT FEMORAL-FEMORAL ARTERY;  Surgeon: Conrad [place], MD;  Location: [facility];  Service: Vascular;  Laterality: Bilateral;  . IR FLUORO GUIDE PORT INSERTION RIGHT  07/05/2017  . IR US GUIDE VASC ACCESS RIGHT  07/05/2017  . LAPAROSCOPIC INSERTION GASTROSTOMY TUBE N/A 11/27/2017   Procedure: JEJUNOSTOMY TUBE PLACEMENT;  Surgeon: [name], MD;  Location: [facility];  Service: General;  Laterality: N/A;  . LAPAROSCOPIC PARTIAL GASTRECTOMY N/A 11/27/2017   Procedure: PARTIAL GASTRECTOMY;  Surgeon: [name], MD;  Location: [place];  Service: General;  Laterality: N/A;  . LAPAROSCOPY N/A 11/27/2017   Procedure: LAPAROSCOPY DIAGNOSTIC;  Surgeon: [name], MD;  Location: [facility];  Service: General;  Laterality: N/A;  . NM MYOVIEW LTD  06/2017   LOW RISK. Small basal inferoseptal defect thought to be related to artifact (although cannot exclude prior infarct). Normal wall motion i in this area would suggest artifact not infarct. Otherwise no ischemia or infarction.  Marland Kitchen PERIPHERAL VASCULAR INTERVENTION Left 05/03/2017   Procedure: PERIPHERAL VASCULAR INTERVENTION;  Surgeon: Conrad Wood,  MD;  Location: [facility];  Service: Cardiovascular;  Laterality: Left;  common iliac  . TRANSTHORACIC ECHOCARDIOGRAM  06/26/2017   Normal LV size and function. EF 60-65%. No wall motion abnormalities. GR 1 DD. Mild LA dilation    Outpatient Encounter Medications as of 03/13/2018  Medication Sig  . acetaminophen (TYLENOL) 325 MG tablet Take 2 tablets (650 mg total) by mouth every 6 (six) hours as needed for mild pain (or Fever >/= 101).  . Albuterol Sulfate (PROAIR RESPICLICK) 628 (90 Base) MCG/ACT AEPB Inhale 2 puffs into the lungs every 6 (six) hours as needed.  . budesonide-formoterol (SYMBICORT) 160-4.5 MCG/ACT inhaler Inhale 2 puffs into the lungs 2 (two) times daily.  Marland Kitchen ipratropium-albuterol (DUONEB) 0.5-2.5 (3) MG/3ML SOLN Take 3 mLs by nebulization 4 (four) times daily.  . nicotine (NICODERM CQ - DOSED IN MG/24 HOURS) 14 mg/24hr patch Place 2 patches (28 mg total) onto the skin daily.  . polyethylene glycol (MIRALAX / GLYCOLAX) packet Take 17 g by mouth daily.  . Tiotropium Bromide Monohydrate (SPIRIVA RESPIMAT) 2.5 MCG/ACT AERS Inhale 2 puffs into the lungs daily.  . traMADol (ULTRAM) 50 MG tablet Take 1 tablet (50 mg total) by mouth every 6 (six) hours as needed for moderate pain.  . [DISCONTINUED] Albuterol Sulfate (PROAIR RESPICLICK) 315 (90 Base) MCG/ACT AEPB Inhale 2 puffs into the lungs every 6 (six) hours as needed.  . [DISCONTINUED] Amino Acids-Protein Hydrolys (FEEDING SUPPLEMENT, PRO-STAT SUGAR FREE 64,) LIQD Place 30 mLs into feeding tube daily.  . [DISCONTINUED] budesonide-formoterol (SYMBICORT) 160-4.5 MCG/ACT inhaler Inhale 2 puffs into the lungs 2 (two) times daily.  . [DISCONTINUED] feeding supplement, ENSURE ENLIVE, (ENSURE ENLIVE) LIQD Take 237 mLs by mouth 2 (two) times daily between meals.  . [DISCONTINUED] ipratropium-albuterol (DUONEB) 0.5-2.5 (3) MG/3ML SOLN Take 3 mLs by nebulization 4 (four) times daily.  . [DISCONTINUED] Nutritional Supplements (FEEDING  SUPPLEMENT, OSMOLITE 1.5 CAL,) LIQD Place 1,000 mLs into feeding tube daily.  . [DISCONTINUED] Tiotropium Bromide Monohydrate (SPIRIVA RESPIMAT) 2.5 MCG/ACT AERS Inhale 2 puffs into the lungs daily.  Marland Kitchen atorvastatin (LIPITOR)  10 MG tablet Take 1 tablet (10 mg total) by mouth daily. (Patient not taking: Reported on 03/13/2018)  . clopidogrel (PLAVIX) 75 MG tablet Take 1 tablet (75 mg total) by mouth daily. (Patient not taking: Reported on 03/13/2018)  . dronabinol (MARINOL) 2.5 MG capsule Take 1 capsule (2.5 mg total) by mouth 2 (two) times daily before a meal. (Patient not taking: Reported on 03/13/2018)  . megestrol (MEGACE) 40 MG/ML suspension Take 5 mLs (200 mg total) by mouth 2 (two) times daily. (Patient not taking: Reported on 03/13/2018)  . metoCLOPramide (REGLAN) 5 MG tablet Take 1 tablet (5 mg total) by mouth 3 (three) times daily before meals. (Patient not taking: Reported on 03/13/2018)  . ondansetron (ZOFRAN) 8 MG tablet Take 1 tablet (8 mg total) by mouth 2 (two) times daily as needed for refractory nausea / vomiting. Start on day 3 after chemotherapy. (Patient not taking: Reported on 03/13/2018)  . oxyCODONE-acetaminophen (ROXICET) 5-325 MG tablet Take 1 tablet by mouth every 6 (six) hours as needed for severe pain. (Patient not taking: Reported on 03/13/2018)  . pantoprazole (PROTONIX) 40 MG tablet Take 1 tablet (40 mg total) by mouth daily at 12 noon. (Patient not taking: Reported on 03/13/2018)  . prochlorperazine (COMPAZINE) 10 MG tablet Take 1 tablet (10 mg total) by mouth every 6 (six) hours as needed (Nausea or vomiting). (Patient not taking: Reported on 03/13/2018)  . senna-docusate (SENOKOT-S) 8.6-50 MG tablet Take 1 tablet by mouth 2 (two) times daily. (Patient not taking: Reported on 03/13/2018)   No facility-administered encounter medications on file as of 03/13/2018.     No Known Allergies   Current Medications, Allergies, Past Medical History, Past Surgical History, Family History, and Social  History were reviewed in [employer] record.   Review of Systems            All symptoms NEG except where BOLDED >>  Constitutional:  F/C/S, fatigue, anorexia, unexpected weight change. HEENT:  HA, visual changes, hearing loss, earache, nasal symptoms, sore throat, mouth sores, hoarseness. Resp:  cough, sputum, hemoptysis; SOB, tightness, wheezing. Cardio:  CP, palpit, DOE, orthopnea, edema; leg weakness & pain w/ ambulation GI:  N/V/D/C, blood in stool; reflux, abd pain, distention, gas. GU:  dysuria, freq, urgency, hematuria, flank pain, voiding difficulty. MS:  joint pain, swelling, tenderness, decr ROM; neck pain, back pain, etc. Neuro:  HA, tremors, seizures, dizziness, syncope, weakness, numbness, gait abn. Skin:  suspicious lesions or skin rash. Heme:  adenopathy, bruising, bleeding. Psyche:  confusion, agitation, sleep disturbance, hallucinations, anxiety, depression suicidal.   Objective:   Physical Exam      Vital Signs:  Reviewed...   General:  WD, cachectic, 62 y/o WM in NAD; alert & oriented; pleasant & cooperative... HEENT:  [place]/AT; Conjunctiva- pink, Sclera- nonicteric, EOM-wnl, PERRLA, EACs-clear, TMs-wnl; NOSE-clear; THROAT-clear & wnl.  Neck:  Supple w/ fair ROM; no JVD; normal carotid impulses w/o bruits; no thyromegaly or nodules palpated; no lymphadenopathy.  Chest:  Decr BS bilat, unable to augment BS voluntarily, clear without wheezes or rales; +congested cough/ rhonchi bilat Heart:  Epig PMI, Regular Rhythm; norm S1 & S2 without murmurs, rubs, or gallops detected. Abdomen:  Thin, soft & nontender- no guarding or rebound; normal bowel sounds; no organomegaly or masses palpated; left abd/ groin bruit Ext:  Normal ROM; without deformities or arthritic changes; no varicose veins, venous insuffic, or edema; LE pulses absent Neuro:  No focal neuro deficits; sensory testing normal; gait normal & balance OK.  Derm:  No lesions noted; no rash  etc. Lymph:  No cervical, supraclavicular, axillary, or inguinal adenopathy palpated.   Assessment:      IMP >>   Severe COPD/ Emphysema>     Bilat small pulm nodules on CT chest 02/2015 + centrilobular emphysema>     Hx hypoxemic respiratory failure>  On O2 at 1L/min Qhs and 2L/min w/ activity but he prefers to use the oxygen just when he feels he needs it...  Cigarette smoker> Prev he was totally unable to quit smoking, finally quit for ~65moafter gastric cancer surg, but has since re-started 3 cig/d & doesn't think he can cut it back any further than this!    ASPVD> he had claudication & vasc insuffic => CT Abd w/ atherosclerotic changes=> fusiform ectasia of infrarenal AA up to 2.6x2.3cm w/ abundant mural thrombus & complete occlusion of the right common iliac art & reconstitution of flow distally, +severe stenosis of prox left common iliac art;  Pt referred to [facility] w/ abn ABIs bilat & subseq L=>R Fem-Fem bypass graft 04/2017 by DrChen, pt disch on Plavix & Lipitor added...  Weight loss/ underweight/ poor appetite => ADENOCARCINOMA of the Stomach> treated by DrFeng w/ neoadjuvant chemo FOLFOX starting 06/2017 but tolerated poorly, then s/p surg w/ diagnostic laparoscopy, distal gastrectomy w/ B ll anastomosis, & J-tube placement by DrByerly on 11/27/17; PATH showed mod diff AdenoCa 3.8cm, lymphovasc & perineural space invasion, 0/4 lymph nodes, no tumor in the omentum...   PLAN >>  09/04/17>   I again implored Bobby Caldwell to quit all smoking but he is not moved- indicates that 10cig/d is not that bad & he will not be able to cut this down further;  He now qualifies for Home O2 & we will set him up w/ home oxygen at 1L/min Qhs and 2L/min w/ activity (he indicated that he would only use it when he felt he needed it for SOB);  Finally we reviewed the need for regular dosing of his medications- Symbicort160-2spBid, Spiriva respimat- 2sp once daily, Mucinex600-1to2Bid w/ fluids, and the ProairHFA as  needed;  He certainly has a lot on him w/ stomach cancer (s/p chemorx & considering surg), PAD- s/p fem-fem bypass, other vasc dis, wt loss/ poor nutrition, etc...  10/22/17>   He has done reasonably well over the last 324yrw/ his severe COPD/emphysema, despite his continued smoking; He also did reasonably well w/ his major vascular procedure 04/2017; I suspect that his longevity-limiting disease process currently is his cancer diagnosis, but his COPD will surely impact his operative risk & risk of complications (ASA-4); For his part- he wants to proceed w/ the surgery (he is ECOG=1); Given his progression to GOLD Stage 4 COPD, his hypoxemic resp failure, and the fact that he doesn't even use his inhalers unless he gets samples-- we discussed the option of switching to a Home NEB machine w/ Duoneb Qid + Symbicort160-2spBid & Spiriva once daily; plus OTC MUCUS RELIEF '400mg'$ - 2tabs tid w/ fluids; plus a course of oral PREDNISONE '20mg'$  Bid x2wks then one tab qam til return in 3-4wks before given the OK for Surg.   11/15/17>   I have asked Bobby Caldwell to cut back further on his smoking & work on cessation but he frankly said he would not be able to do any better than this!  He agreed to continue the DUONEB Qid, & cut the Prednisone to '10mg'$  Qam;  He is asked to wear his oxygen at 1L/min Qhs and 2L/min w/  activity but I am skeptical of his compliance;  we provided him w/ another months worth of Symbicort160 & Spirva Respimat...  I will contact DrByerly regarding his upcoming surg. 12/20/17>   We reviewed Bobby Caldwell's pulm management- refilled Duoneb, Symbicort, Spiriva respimat, & advised on O2 at 1L/min Qhs & 2L/min w/ activity;  We reviewed benefits of exercise program- but he wants handicap sticker,  and we gave him our congrats on not smoking; He will maintain f/u w/ DrByerly, DrJacobs, DrFeng...  01/22/18>   His resp status is stable but I am concerned about medication compliance & his risk to escalate smoking in the coming  weeks & months- we discussed both issues;  He is still not able to eat by mouth & GI deferred to [facility] & plans f/u in 4-6 weeks;  We are ontinuing to try & get him a better port O2 system to support his need for exercise program... We will recheck pt in 6wks & sooner if needed for resp problems. 03/13/18>   We stressed the need to stay on his meds, not smoke, and increase exercise program- he is not inclined to do any of these things; he thinks smoking 3 cig/d is pretty good for him but I told him it is slowly killing him!  He stopped the Lipitor & Plavix thinking the bypass surg would fix him forever.  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  He needs to establish with a PCP to pick up on all these issues.  From the pulmonary standpoint we again reviewed the need for O2 at 2L/min w/ activity & 1L/min Qhs, DUONEB Qid, Symbicort160-2spBid & Spiriva Respimat-2sp once daily, Mucinex and AlbutHFA rescue inhaler as needed, plus no smoking & increase exercise program (consider pulm rehab if he will participate)   Plan:     Home OXYGEN at 2L/min w/ activity & 1L/min Qhs... Patient's Medications  New Prescriptions   No medications on file  Previous Medications   ACETAMINOPHEN (TYLENOL) 325 MG TABLET    Take 2 tablets (650 mg total) by mouth every 6 (six) hours as needed for mild pain (or Fever >/= 101).   ATORVASTATIN (LIPITOR) 10 MG TABLET    Take 1 tablet (10 mg total) by mouth daily.   CLOPIDOGREL (PLAVIX) 75 MG TABLET    Take 1 tablet (75 mg total) by mouth daily.   DRONABINOL (MARINOL) 2.5 MG CAPSULE    Take 1 capsule (2.5 mg total) by mouth 2 (two) times daily before a meal.   MEGESTROL (MEGACE) 40 MG/ML SUSPENSION    Take 5 mLs (200 mg total) by mouth 2 (two) times daily.   METOCLOPRAMIDE (REGLAN) 5 MG TABLET    Take 1 tablet (5 mg total) by mouth 3 (three) times daily before meals.   NICOTINE (NICODERM CQ - DOSED  IN MG/24 HOURS) 14 MG/24HR PATCH    Place 2 patches (28 mg total) onto the skin daily.   ONDANSETRON (ZOFRAN) 8 MG TABLET    Take 1 tablet (8 mg total) by mouth 2 (two) times daily as needed for refractory nausea / vomiting. Start on day 3 after chemotherapy.   OXYCODONE-ACETAMINOPHEN (ROXICET) 5-325 MG TABLET    Take 1 tablet by mouth every 6 (six) hours as needed for severe pain.   PANTOPRAZOLE (PROTONIX) 40 MG TABLET    Take 1 tablet (40 mg total) by mouth daily at 12 noon.  POLYETHYLENE GLYCOL (MIRALAX / GLYCOLAX) PACKET    Take 17 g by mouth daily.   PROCHLORPERAZINE (COMPAZINE) 10 MG TABLET    Take 1 tablet (10 mg total) by mouth every 6 (six) hours as needed (Nausea or vomiting).   SENNA-DOCUSATE (SENOKOT-S) 8.6-50 MG TABLET    Take 1 tablet by mouth 2 (two) times daily.   TRAMADOL (ULTRAM) 50 MG TABLET    Take 1 tablet (50 mg total) by mouth every 6 (six) hours as needed for moderate pain.  Modified Medications   Modified Medication Previous Medication   ALBUTEROL SULFATE (PROAIR RESPICLICK) 501 (90 BASE) MCG/ACT AEPB Albuterol Sulfate (PROAIR RESPICLICK) 586 (90 Base) MCG/ACT AEPB      Inhale 2 puffs into the lungs every 6 (six) hours as needed.    Inhale 2 puffs into the lungs every 6 (six) hours as needed.   BUDESONIDE-FORMOTEROL (SYMBICORT) 160-4.5 MCG/ACT INHALER budesonide-formoterol (SYMBICORT) 160-4.5 MCG/ACT inhaler      Inhale 2 puffs into the lungs 2 (two) times daily.    Inhale 2 puffs into the lungs 2 (two) times daily.   IPRATROPIUM-ALBUTEROL (DUONEB) 0.5-2.5 (3) MG/3ML SOLN ipratropium-albuterol (DUONEB) 0.5-2.5 (3) MG/3ML SOLN      Take 3 mLs by nebulization 4 (four) times daily.    Take 3 mLs by nebulization 4 (four) times daily.   TIOTROPIUM BROMIDE MONOHYDRATE (SPIRIVA RESPIMAT) 2.5 MCG/ACT AERS Tiotropium Bromide Monohydrate (SPIRIVA RESPIMAT) 2.5 MCG/ACT AERS      Inhale 2 puffs into the lungs daily.    Inhale 2 puffs into the lungs daily.  Discontinued Medications    AMINO ACIDS-PROTEIN HYDROLYS (FEEDING SUPPLEMENT, PRO-STAT SUGAR FREE 64,) LIQD    Place 30 mLs into feeding tube daily.   FEEDING SUPPLEMENT, ENSURE ENLIVE, (ENSURE ENLIVE) LIQD    Take 237 mLs by mouth 2 (two) times daily between meals.   NUTRITIONAL SUPPLEMENTS (FEEDING SUPPLEMENT, OSMOLITE 1.5 CAL,) LIQD    Place 1,000 mLs into feeding tube daily.

## 2018-03-13 NOTE — Telephone Encounter (Signed)
Okay to put scripts in under me.

## 2018-03-13 NOTE — Telephone Encounter (Signed)
Spoke with a pharmacy rep at [employer]. She stated that Dr. Jeannine Kitten NPI is not working for [facility] patients. They will need a new prescription under someone who is registered with [facility].   Spoke with Kathlee Nations. She confirmed that Dr. Lenna Gilford is working with [facility] to get this matter resolved.   Dr. Halford Chessman, would you be willing to sign for patient's prescriptions of ProAir, Symbicort 160 and Spiriva? Thanks!

## 2018-03-13 NOTE — Patient Instructions (Signed)
Today we updated your med list in our EPIC system...    Continue your current medications the same...  Be sure to use the DUONEB by NEBULIZER 4 times daily ...  Follow this with your SYMBICORT- 2 sprays twice daily... And the [facility] Respimat- 2 sprays once daily...  Continue the RESCUE Inhaler as needed when you are away from home...  We discussed about the importance of a good EXERCISE program...    Do some exercise around your home every day & gradually increase the amount of exercise...  We reviewed the importance of quitting smoking-- try to cut to 2 cigarettes a day!!!  Call for any questions...  Let's plan a follow up visit in 6weeks.Marland KitchenMarland Kitchen

## 2018-03-14 ENCOUNTER — Other Ambulatory Visit: Payer: Self-pay | Admitting: *Deleted

## 2018-03-14 ENCOUNTER — Telehealth: Payer: Self-pay | Admitting: Pulmonary Disease

## 2018-03-14 MED ORDER — ALBUTEROL SULFATE HFA 108 (90 BASE) MCG/ACT IN AERS: 1.0000 | INHALATION_SPRAY | Freq: Four times a day (QID) | RESPIRATORY_TRACT | 3 refills | Status: DC | PRN

## 2018-03-14 MED ORDER — TIOTROPIUM BROMIDE MONOHYDRATE 2.5 MCG/ACT IN AERS: 2.0000 | INHALATION_SPRAY | Freq: Every day | RESPIRATORY_TRACT | 0 refills | Status: DC

## 2018-03-14 MED ORDER — ALBUTEROL SULFATE HFA 108 (90 BASE) MCG/ACT IN AERS: 2.0000 | INHALATION_SPRAY | Freq: Four times a day (QID) | RESPIRATORY_TRACT | 6 refills | Status: DC | PRN

## 2018-03-14 MED ORDER — IPRATROPIUM-ALBUTEROL 0.5-2.5 (3) MG/3ML IN SOLN: 3.0000 mL | Freq: Four times a day (QID) | RESPIRATORY_TRACT | 4 refills | Status: DC

## 2018-03-14 NOTE — Telephone Encounter (Signed)
I called Bobby Caldwell the patient has to try and fail Spiriva handihaler and Stiolto before they will cover Spiriva Respimat 2.5. I did not see in his chart where he has tried and failed these medications. Can we switch pt? If not please explain why he is unable to take these medications so I can see if they will cover Spiriva Respimat.   They will cover Albuterol HFA ProAir but no the Respiclick. I sent in the [place] and called the pharmacy to see if Medicaid covered it. The pharmacist stated it went through for 3 dollars.   Will route to [facility] for follow up after SN decides what he wants to switch medication to.

## 2018-03-14 NOTE — Telephone Encounter (Signed)
Pt in office regarding symbicort and Spiriva. Pt states medications are not covered by insurance.  I have spoken to [facility], who states Symbicort is ready for pickup and Spiriva and proair is needing PA.  Per office protocol Rx for ventolin has been sent to preferred pharmacy. Pt has been provided with two samples of spiriva 2.5. I have requested that PA request for Spiriva be faxed to our office.  Will route message to triage to await PA request.

## 2018-03-15 MED ORDER — TIOTROPIUM BROMIDE MONOHYDRATE 18 MCG IN CAPS: 18.0000 ug | ORAL_CAPSULE | Freq: Every day | RESPIRATORY_TRACT | 6 refills | Status: DC

## 2018-03-15 NOTE — Telephone Encounter (Signed)
Per SN- OK to use Spiriva handheld, #30 with refills, inhale contents everyday.  Prescription sent to preferred pharmacy, [facility] [address], [place]. Left message for Patient to  call back for inhaler instructions or questions.

## 2018-03-18 NOTE — Telephone Encounter (Signed)
Attempted to call pt. I did not receive an answer. I have left a message for pt to return our call.  

## 2018-03-19 ENCOUNTER — Other Ambulatory Visit: Payer: Self-pay | Admitting: *Deleted

## 2018-03-19 MED ORDER — TIOTROPIUM BROMIDE MONOHYDRATE 18 MCG IN CAPS: 18.0000 ug | ORAL_CAPSULE | Freq: Every day | RESPIRATORY_TRACT | 6 refills | Status: DC

## 2018-03-19 NOTE — Telephone Encounter (Signed)
Called and spoke with Patient's Daughter, Imrane.  Explained new inhaler, Spiriva handheld with capsules sent to pharmacy.  Instructed that if he needed instruction and demonstration on new inhaler to call or come by [facility] Office. Imrane stated understanding.  Nothing further at this time.

## 2018-03-23 ENCOUNTER — Encounter: Payer: Self-pay | Admitting: Nurse Practitioner

## 2018-04-01 NOTE — Progress Notes (Signed)
[place]  [phone] [phone]  Clinic Follow Up Note   Patient Care Team: Patient, No Pcp Per as PCP - General (General Practice) [name], MD as Consulting Physician (Pulmonary Disease) [name], MD as Attending Physician (Gastroenterology) [name], MD as Consulting Physician (General Surgery)   Date of Service: 04/04/2018   CHIEF COMPLAINTS:  Follow up gastric Adenocarcinoma   Oncology History   Cancer Staging Gastric cancer [facility]) Staging form: Stomach, AJCC 8th Edition - Clinical stage from 05/24/2017: Stage I (cT2, cN0, cM0) - Signed by [name], MD on 06/19/2017 - Pathologic stage from 11/27/2017: Stage I (ypT1b, pN0, cM0) - Signed by [name], MD on 04/04/2018       Gastric cancer ([place])   05/24/2017 Procedure    EGD by Dr. [name] 05/24/17 IMPRESSION Ulcerated mass like region in the distal stomach (4cm across), extensively biopsied. This appears neoplastic. - Abnormal mucosa throughout stomach otherwise (mild-moderate gastritis), also extensively biopsied.    05/24/2017 Initial Biopsy    Diagnosis 05/24/17  1. Stomach, biopsy, mass distal - ADENOCARCINOMA, SEE COMMENT. - CHRONIC ACTIVE GASTRITIS WITH INTESTINAL METAPLASIA. 2. 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 - NO DYSPLASIA OR MALIGNANCY    06/04/2017 Initial Diagnosis    Gastric cancer ([place])    06/07/2017 Procedure    EUS by Dr. [name] on 06/07/17  IMPRESSION:  5cm across, partially circumferential uT2N0 gastric adenocarcinoma laying in the distal stomach 3-4cm proximal to the pylorus.    06/22/2017 Imaging    CT CAP W Contrast 06/22/17  IMPRESSION: 1. The gastric mass is somewhat inconspicuous on CT, but a potential 1.2 cm thick mucosal masses seen along the posterior gastric border on image 83/2. There several small adjacent right gastric lymph nodes  measuring up to 0.7 cm in short axis, but no overtly pathologic adenopathy and no findings of hepatic or peritoneal metastatic disease at this time. 2. Calcified granulomas in both lungs along with several noncalcified nodules stable from August 2016 and likely benign given this long-term stability. 3. Other imaging findings of potential clinical significance: Aortic Atherosclerosis (ICD10-I70.0) and Emphysema (ICD10-J43.9). Airway thickening is present, suggesting bronchitis or reactive airways disease. 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 Disc bulges at L3-4 and L4-5.    07/05/2017 Surgery    PAC placement     07/07/2017 - 09/06/2017 Chemotherapy    Neoadjuvant FOLFOX with with leucovorin every 2 weeks. Plan for 6 cycles but stopped after 4 cycles due to poor tolerance      09/10/2017 Imaging     CT AP W Contrast 09/10/17 IMPRESSION: 1. No acute findings identified within the abdomen or pelvis. 2. Interval development of multiple bilateral pulmonary densities within both lower lobes. These are indeterminate, likely post infectious or inflammatory in etiology. Atypical infection not excluded. Metastatic disease is considered less favored but not entirely excluded and follow-up imaging to ensure resolution is advised. 3. No evidence to suggest recurrent tumor or metastatic disease within the abdomen or pelvis. 4.  Aortic Atherosclerosis (ICD10-I70.0).    11/27/2017 Surgery     Surgery He had partial gastrectomy with Dr. [name] on 11/27/2017.    11/27/2017 Pathology Results    11/27/2017 Surgical Pathology Diagnosis 1. Stomach, resection for tumor, Distal w/ Omentum - ADENOCARCINOMA, MODERATELY DIFFERENTIATED, 3.8 CM - LYMPHOVASCULAR SPACE AND PERINEURAL INVASION - NO CARCINOMA IDENTIFIED IN FOUR LYMPH  NODES (0/4) - SEE ONCOLOGY TABLE AND COMMENT BELOW 2. Lymph node, biopsy, Portal - NO CARCINOMA  IDENTIFIED IN ONE LYMPH NODE (0/1) - SEE COMMENT 3. Lymph node, biopsy, Left Gastric - NO CARCINOMA IDENTIFIED IN TWO LYMPH NODES (0/2) - SEE COMMENT 4. Omentum, resection for tumor - BENIGN FIBROADIPOSE TISSUE - NO CARCINOMA IDENTIFIED     11/27/2017 Cancer Staging    Staging form: Stomach, AJCC 8th Edition - Pathologic stage from 11/27/2017: Stage I (ypT1b, pN0, cM0) - Signed by [name], MD on 04/04/2018      HISTORY OF PRESENTING ILLNESS: 06/05/17  Bobby Caldwell 62 y.o. male is here because of newly diagnosed Gastric Cancer. He was referred by Dr. [name]. He presents to the clinic today accompanied by an albanian interpretor and then helped by video interpretor [phone] to help translate his visit.   In the past he was diagnosed with COPD controlled with inhalers. He has smoked for several years and is down to smoking 10 cigars a day. He is actively trying to quit.   Today he notes he is upset about his diagnosis and he recently had bypass surgery on his bilateral legs in 04/2017 and feels fatigued from this. He met with Dr. [name] today and he was told he will need chemo and surgery. He is willing to go with whatever is recommended.  He notes this started with issues with both legs feeling weak and shaking. This occurred for 2-3 months. He had bypass surgery with Dr. Bridgett Larsson. He does not walk much so he has not tested how well he can walk much. The 03/2017 CT scan before his surgery showed thickening in his stomach which is why he was referred to Dr. [name]. Patient notes his stomach issues has been ongoing, but recently has burning sensation below umbilical area since endoscopy. This was only mildly presents prior to procedure. His appetite has not changed much. He doesn't dare to eat much as he is afraid to vomit. He has felt this way for th past 6 months. He currently loss 14 pounds in the past 6 months. He does not go out much anymore, he mostly stays in his house. He is able to do ALDs  himself, his wife helps out around the house and cares for him. He has 4 children, 37-21yo. His wife does not work. He does not find himself feeling up to working anymore, he has no energy to do anything.  He notes pain in his chest from his COPD. He uses inhalers 3 times a day. He would like to do the surgery alone but if chemo is needed he is fine with proceeding with treatment. He reports his body shakes when he gets blood drawn. He is willing to try oral iron in the meantime. He would like to be seen by social worker to help him our with finances as he is not able to work. He feels he often has to break due to sever fatigue and weakness.     CURRENT THERAPY: Surveillance  INTERVAL HISTORY:  Bobby Caldwell is here for follow up. He had partial gastrectomy with Dr. [name] on 11/27/2017. He saw NP Lacie and I on 10/08/2017, when he still had dyspnea due to COPD. He also saw Dr. Lenna Gilford, Dr. Bridgett Larsson, Dr. [name], and PA-C Barret since our last visit.   Today, he is here with his wife. He is recovering well after surgery and states that he is eating well, but still has breathing difficulties. He has O2 at  home and uses it during the day, but not at night. He states that he felt cramping pain after the surgery around the incision site. He is maintaining his weight.   He will see Dr. [name] in 2 months. He is still smoking. He states that his cough is not bad.  MEDICAL HISTORY:  Past Medical History:  Diagnosis Date  . COPD (chronic obstructive pulmonary disease) ([place])   . Dyspnea   . PAD (peripheral artery disease) ([place])   . Stomach cancer ([place])   . Tobacco abuse   . Weight loss     SURGICAL HISTORY: Past Surgical History:  Procedure Laterality Date  . ABDOMINAL AORTOGRAM W/LOWER EXTREMITY N/A 05/03/2017   Procedure: ABDOMINAL AORTOGRAM W/LOWER EXTREMITY;  Surgeon: Conrad Elba, MD;  Location: [place] [facility];  Service: Cardiovascular;  Laterality: N/A;  . ESOPHAGOGASTRODUODENOSCOPY      05/24/17  . ESOPHAGOGASTRODUODENOSCOPY (EGD) WITH PROPOFOL N/A 05/24/2017   Procedure: ESOPHAGOGASTRODUODENOSCOPY (EGD) WITH PROPOFOL;  Surgeon: [name], MD;  Location: [facility];  Service: Endoscopy;  Laterality: N/A;  . EUS N/A 06/07/2017   Procedure: UPPER ENDOSCOPIC ULTRASOUND (EUS) RADIAL;  Surgeon: [name], MD;  Location: [facility];  Service: Endoscopy;  Laterality: N/A;  . FEMORAL-FEMORAL BYPASS GRAFT Bilateral 05/08/2017   Procedure: LEFT TO RIGHT BYPASS GRAFT FEMORAL-FEMORAL ARTERY;  Surgeon: Conrad Powhatan, MD;  Location: [place];  Service: Vascular;  Laterality: Bilateral;  . IR FLUORO GUIDE PORT INSERTION RIGHT  07/05/2017  . IR US GUIDE VASC ACCESS RIGHT  07/05/2017  . LAPAROSCOPIC INSERTION GASTROSTOMY TUBE N/A 11/27/2017   Procedure: JEJUNOSTOMY TUBE PLACEMENT;  Surgeon: [name], MD;  Location: [place];  Service: General;  Laterality: N/A;  . LAPAROSCOPIC PARTIAL GASTRECTOMY N/A 11/27/2017   Procedure: PARTIAL GASTRECTOMY;  Surgeon: [name], MD;  Location: [place];  Service: General;  Laterality: N/A;  . LAPAROSCOPY N/A 11/27/2017   Procedure: LAPAROSCOPY DIAGNOSTIC;  Surgeon: [name], MD;  Location: [place];  Service: General;  Laterality: N/A;  . NM MYOVIEW LTD  06/2017   LOW RISK. Small basal inferoseptal defect thought to be related to artifact (although cannot exclude prior infarct). Normal wall motion i in this area would suggest artifact not infarct. Otherwise no ischemia or infarction.  Marland Kitchen PERIPHERAL VASCULAR INTERVENTION Left 05/03/2017   Procedure: PERIPHERAL VASCULAR INTERVENTION;  Surgeon: Conrad Prathersville, MD;  Location: [place] [facility];  Service: Cardiovascular;  Laterality: Left;  common iliac  . TRANSTHORACIC ECHOCARDIOGRAM  06/26/2017   Normal LV size and function. EF 60-65%. No wall motion abnormalities. GR 1 DD. Mild LA dilation    SOCIAL HISTORY: Social History   Socioeconomic History  . Marital status: Married    Spouse  name: Not on file  . Number of children: 4  . Years of education: Not on file  . Highest education level: Not on file  Occupational History  . Not on file  Social Needs  . Financial resource strain: Not on file  . Food insecurity:    Worry: Not on file    Inability: Not on file  . Transportation needs:    Medical: Not on file    Non-medical: Not on file  Tobacco Use  . Smoking status: Current Every Day Smoker    Packs/day: 0.50    Years: 50.00    Pack years: 25.00    Types: Cigarettes    Last attempt to quit: 11/2017    Years since quitting: 0.4  .  Smokeless tobacco: Never Used  . Tobacco comment: 3 cig per day  Substance and Sexual Activity  . Alcohol use: No    Alcohol/week: 0.0 standard drinks  . Drug use: No  . Sexual activity: Yes  Lifestyle  . Physical activity:    Days per week: Not on file    Minutes per session: Not on file  . Stress: Not on file  Relationships  . Social connections:    Talks on phone: Not on file    Gets together: Not on file    Attends religious service: Not on file    Active member of club or organization: Not on file    Attends meetings of clubs or organizations: Not on file    Relationship status: Not on file  . Intimate partner violence:    Fear of current or ex partner: Not on file    Emotionally abused: Not on file    Physically abused: Not on file    Forced sexual activity: Not on file  Other Topics Concern  . Not on file  Social History Narrative  . Not on file    FAMILY HISTORY: Family History  Family history unknown: Yes    ALLERGIES:  has No Known Allergies.  MEDICATIONS:  Current Outpatient Medications  Medication Sig Dispense Refill  . acetaminophen (TYLENOL) 325 MG tablet Take 2 tablets (650 mg total) by mouth every 6 (six) hours as needed for mild pain (or Fever >/= 101). 100 tablet 0  . albuterol (PROAIR HFA) 108 (90 Base) MCG/ACT inhaler Inhale 1-2 puffs into the lungs every 6 (six) hours as needed for  wheezing or shortness of breath. 1 Inhaler 3  . atorvastatin (LIPITOR) 10 MG tablet Take 1 tablet (10 mg total) by mouth daily. 30 tablet 2  . budesonide-formoterol (SYMBICORT) 160-4.5 MCG/ACT inhaler Inhale 2 puffs into the lungs 2 (two) times daily. 1 Inhaler 6  . clopidogrel (PLAVIX) 75 MG tablet Take 1 tablet (75 mg total) by mouth daily. 30 tablet 11  . dronabinol (MARINOL) 2.5 MG capsule Take 1 capsule (2.5 mg total) by mouth 2 (two) times daily before a meal. 20 capsule 0  . ipratropium-albuterol (DUONEB) 0.5-2.5 (3) MG/3ML SOLN Take 3 mLs by nebulization 4 (four) times daily. 360 mL 4  . megestrol (MEGACE) 40 MG/ML suspension Take 5 mLs (200 mg total) by mouth 2 (two) times daily. 240 mL 3  . metoCLOPramide (REGLAN) 5 MG tablet Take 1 tablet (5 mg total) by mouth 3 (three) times daily before meals. 60 tablet 0  . nicotine (NICODERM CQ - DOSED IN MG/24 HOURS) 14 mg/24hr patch Place 2 patches (28 mg total) onto the skin daily. 21 patch 0  . ondansetron (ZOFRAN) 8 MG tablet Take 1 tablet (8 mg total) by mouth 2 (two) times daily as needed for refractory nausea / vomiting. Start on day 3 after chemotherapy. 30 tablet 1  . oxyCODONE-acetaminophen (ROXICET) 5-325 MG tablet Take 1 tablet by mouth every 6 (six) hours as needed for severe pain. 20 tablet 0  . pantoprazole (PROTONIX) 40 MG tablet Take 1 tablet (40 mg total) by mouth daily at 12 noon. 30 tablet 0  . polyethylene glycol (MIRALAX / GLYCOLAX) packet Take 17 g by mouth daily. 14 each 0  . prochlorperazine (COMPAZINE) 10 MG tablet Take 1 tablet (10 mg total) by mouth every 6 (six) hours as needed (Nausea or vomiting). 30 tablet 1  . senna-docusate (SENOKOT-S) 8.6-50 MG tablet Take 1 tablet by mouth  2 (two) times daily. 20 tablet 0  . tiotropium (SPIRIVA) 18 MCG inhalation capsule Place 1 capsule (18 mcg total) into inhaler and inhale daily. 30 capsule 6  . Tiotropium Bromide Monohydrate (SPIRIVA RESPIMAT) 2.5 MCG/ACT AERS Inhale 2 puffs into  the lungs daily. 1 Inhaler 6  . Tiotropium Bromide Monohydrate (SPIRIVA RESPIMAT) 2.5 MCG/ACT AERS Inhale 2 puffs into the lungs daily. 1 Inhaler 0  . traMADol (ULTRAM) 50 MG tablet Take 1 tablet (50 mg total) by mouth every 6 (six) hours as needed for moderate pain. 30 tablet 1   Current Facility-Administered Medications  Medication Dose Route Frequency Provider Last Rate Last Dose  . sodium chloride flush (NS) 0.9 % injection 10 mL  10 mL Intravenous PRN [name], MD   10 mL at 04/04/18 0856    REVIEW OF SYSTEMS:  Constitutional: Denies fevers, chills or abnormal night sweats  Eyes: Denies blurriness of vision, double vision or watery eyes Ears, nose, mouth, throat, and face: Denies mucositis or sore throat Respiratory: (+) dyspnea, uses O2 as needed (+) mild cough Cardiovascular: Denies palpitation or lower extremity swelling   Gastrointestinal:  Denies heartburn or change in bowel habits (+) abdominal pain around incision site Skin: Denies abnormal skin rashes  Lymphatics: Denies new lymphadenopathy or easy bruising Neurological:Denies numbness, tingling or new weaknesses MSK: (+) bilateral leg weakness/slight pain  Behavioral/Psych: Mood is stable, no new changes  All other systems were reviewed with the patient and are negative.   PHYSICAL EXAMINATION:  ECOG PERFORMANCE STATUS: 2 - Symptomatic, <50% confined to bed  Vitals:   04/04/18 0807  BP: (!) 149/90  Pulse: 98  Resp: 17  Temp: 98 F (36.7 C)  SpO2: 97%   Filed Weights   04/04/18 0807  Weight: 131 lb 3.2 oz (59.5 kg)    GENERAL:alert, no distress and comfortable SKIN: skin color, texture, turgor are normal, no rashes or significant lesions EYES: normal, conjunctiva are pink and non-injected, sclera clear OROPHARYNX:no exudate, no erythema and lips, buccal mucosa, and tongue normal  NECK: supple, thyroid normal size, non-tender, without nodularity LYMPH:  no palpable lymphadenopathy in the cervical, axillary  or inguinal LUNGS: clear to auscultation and percussion with normal breathing effort  HEART: regular rate & rhythm and no murmurs and no lower extremity edema ABDOMEN:abdomen soft, non-tender and normal bowel sounds  Musculoskeletal:no cyanosis of digits and no clubbing  PSYCH: alert & oriented x 3 with fluent speech NEURO: no focal motor/sensory deficits  LABORATORY DATA:  I have reviewed the data as listed CBC Latest Ref Rng & Units 04/04/2018 12/03/2017 12/02/2017  WBC 4.0 - 10.3 K/uL 8.2 11.6(H) 9.1  Hemoglobin 13.0 - 17.1 g/dL 15.2 12.0(L) 11.3(L)  Hematocrit 38.4 - 49.9 % 45.1 37.0(L) 34.8(L)  Platelets 140 - 400 K/uL 239 279 235     CMP Latest Ref Rng & Units 04/04/2018 12/04/2017 12/04/2017  Glucose 70 - 99 mg/dL 87 214(H) -  BUN 8 - 23 mg/dL 7(L) 17 -  Creatinine 0.61 - 1.24 mg/dL 0.74 0.52(L) 0.48(L)  Sodium 135 - 145 mmol/L 141 136 -  Potassium 3.5 - 5.1 mmol/L 3.6 5.3(H) -  Chloride 98 - 111 mmol/L 105 93(L) -  CO2 22 - 32 mmol/L 27 34(H) -  Calcium 8.9 - 10.3 mg/dL 9.5 9.3 -  Total Protein 6.5 - 8.1 g/dL 7.1 - -  Total Bilirubin 0.3 - 1.2 mg/dL 0.4 - -  Alkaline Phos 38 - 126 U/L 102 - -  AST 15 -  41 U/L 14(L) - -  ALT 0 - 44 U/L 20 - -    CEA  06/19/17: 3.38 07/06/17: 2.88  Surgery He had partial gastrectomy with Dr. [name] on 11/27/2017.  PATHOLOGY  11/27/2017 Surgical Pathology Diagnosis 1. Stomach, resection for tumor, Distal w/ Omentum - ADENOCARCINOMA, MODERATELY DIFFERENTIATED, 3.8 CM - LYMPHOVASCULAR SPACE AND PERINEURAL INVASION - NO CARCINOMA IDENTIFIED IN FOUR LYMPH NODES (0/4) - SEE ONCOLOGY TABLE AND COMMENT BELOW 2. Lymph node, biopsy, Portal - NO CARCINOMA IDENTIFIED IN ONE LYMPH NODE (0/1) - SEE COMMENT 3. Lymph node, biopsy, Left Gastric - NO CARCINOMA IDENTIFIED IN TWO LYMPH NODES (0/2) - SEE COMMENT 4. Omentum, resection for tumor - BENIGN FIBROADIPOSE TISSUE - NO CARCINOMA IDENTIFIED Microscopic Comment 1. STOMACH: Procedure:  Partial gastrectomy, distal Tumor Site: Distal stomach (2 cm from pylorus) Tumor Size: 3.8 cm Histologic Type: Adenocarcinoma, intestinal type Histologic Grade: Moderately differentiated Tumor Extension: Tumor invades submucosa Margins: All margins uninvolved by invasive carcinoma and dysplasia Margins examined: Proximal and distal Treatment Effect: Absent (Extensive residual cancer with no evident tumor regression (poor or no response, score 3) Lymphovascular Invasion: Present Regional Lymph Nodes: Number of Lymph Nodes Involved: 0 Number of Lymph Nodes Examined: 7 Pathologic Stage Classification (pTNM, AJCC 8th Edition): ypT1b, ypN0 Ancillary Studies: Available upon request Representative tumor block: 1G Comment(s): The adenocarcinoma is arising in a background of high grade dysplasia with intestinal metaplasia and chronic gastritis. Cytokeratin AE1/3 was utilized to exclude nodal metastasis; there is no evidence of carcinoma in the examined lymph nodes. Intraoperative Diagnosis 1. FROZEN SECTION DIAGNOSIS: DISTAL STOMACH, CLOSEST MARGIN: MARGIN APPEARS NEGATIVE FOR CARCINOMA ([facility]). Specimen Gross and Clinical Information Specimen(s) Obtained: 1. Stomach, resection for tumor, Distal w/ Omentum 2. Lymph node, biopsy, Portal 3. Lymph node, biopsy, Left Gastric 4. Omentum, resection for tumor Specimen Clinical Information 1. Gastric cancer (nt) Gross 1. Received fresh for intraoperative consult is a 10 x 6 x 3 cm portion of stomach, received with both ends stapled and unoriented. There is a 13.5 x 7 x 1.5 cm portion of tan yellow lobulated omentum adherent to the serosal surface. The serosal surface is entirely inked black. The specimen is opened to reveal a raised 3.8 x 1.2 cm possible mass, 2 cm to the closest margin and 3.5 cm to the opposite margin. The closest margin is submitted en face for frozen section and subsequently submitted in block 1A. The remaining mucosa is tan  pink, with focal normal rugal folding. The wall averages 0.2 cm. Additional masses or lesions are not seen. Five possible lymph nodes are dissected from the attached omentum averaging 0.5 cm. There is an additional 26 x 11 x 2 cm portion of tan yellow lobulated omentum received separately within the container, sectioned to reveal tan yellow soft cut surfaces. Representative sections are submitted as follows: A = closest margin en face, frozen section remnant. B = four possible lymph nodes whole C = one possible lymph node bisected D = opposite margin en face E-I = mass entirely J = representative sections away from mass, possible stomach-duodenal junction. 2. Received in saline is a 1 cm tan pink to rubbery lymph node, sectioned to reveal tan pink cut surfaces. The specimen is entirely submitted in one cassette. 3. Received in saline are four possible tan pink lymph nodes ranging from 0.4 up to 1 cm. The specimen is submitted entirely as follows: A = one lymph node trisected B = two lymph nodes bisected, one inked black C = one  possible lymph node with remaining soft tissue. 4. Received in formalin is an 18 x 8 x 1.5 cm portion of tan yellow lobulated omentum, with a smooth glistening external surface, grossly free of lesions. Sectioning reveals unremarkable yellow lobulated cut surfaces. Representative sections are submitted in four cassettes. (AK:kh 11-27-17) Stain(s) used in Diagnosis: The following stain(s) were used in diagnosing the case: CK AE1AE3. The control(s) stained appropriately   Diagnosis 05/24/17  1. Stomach, biopsy, mass distal - ADENOCARCINOMA, SEE COMMENT. - CHRONIC ACTIVE GASTRITIS WITH INTESTINAL METAPLASIA. 2. 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 - NO DYSPLASIA OR MALIGNANCY. Microscopic Comment 1. The fragments are superficial and therefore assessment of invasion is hampered.  Dr. Saralyn Pilar has reviewed the case. The case was called to Dr. [name] on 05/25/2017. 2. A Warthin-Starry stain is performed to determine the possibility of the presence of Helicobacter pylori. The Warthin-Starry stain is negative for organisms of Helicobacter pylori   PROCEDURES   EUS by Dr. [name] on 06/07/17  IMPRESSION:  5cm across, partially circumferential uT2N0 gastric adenocarcinoma laying in the distal stomach 3-4cm proximal to the pylorus.    EGD by Dr. [name] 05/24/17 IMPRESSION Ulcerated mass like region in the distal stomach (4cm across), extensively biopsied. This appears neoplastic. - Abnormal mucosa throughout stomach otherwise (mild-moderate gastritis), also extensively biopsied.   RADIOGRAPHIC STUDIES: I have personally reviewed the radiological images as listed and agreed with the findings in the report.   CT AP W Contrast 09/10/17 IMPRESSION: 1. No acute findings identified within the abdomen or pelvis. 2. Interval development of multiple bilateral pulmonary densities within both lower lobes. These are indeterminate, likely post infectious or inflammatory in etiology. Atypical infection not excluded. Metastatic disease is considered less favored but not entirely excluded and follow-up imaging to ensure resolution is advised. 3. No evidence to suggest recurrent tumor or metastatic disease within the abdomen or pelvis. 4.  Aortic Atherosclerosis (ICD10-I70.0).  CT CAP W Contrast 06/22/17  IMPRESSION: 1. The gastric mass is somewhat inconspicuous on CT, but a potential 1.2 cm thick mucosal masses seen along the posterior gastric border on image 83/2. There several small adjacent right gastric lymph nodes measuring up to 0.7 cm in short axis, but no overtly pathologic adenopathy and no findings of hepatic or peritoneal metastatic disease at this time. 2. Calcified granulomas in both lungs along with several noncalcified nodules stable from August 2016 and  likely benign given this long-term stability. 3. Other imaging findings of potential clinical significance: Aortic Atherosclerosis (ICD10-I70.0) and Emphysema (ICD10-J43.9). Airway thickening is present, suggesting bronchitis or reactive airways disease. 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 Disc bulges at L3-4 and L4-5.   CT CAP 04/02/17 IMPRESSION: 1. Aortic atherosclerosis with fusiform ectasia of the infrarenal abdominal aorta which measures up to 2.6 x 2.3 cm, and contains a large burden of mural thrombus. 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 2. Today's study was not a CTA examination, which limits assessment of vascular abnormalities. Additionally, secondary to extensive patient respiratory motion, accurate assessment of small vessels is limited. With these limitations in mind, there is potential stenosis of both the origin of the celiac axis and the origin of the inferior mesenteric artery. Given the patient's history of intermittent abdominal pain, further evaluation with CTA of the abdomen and pelvis should be considered  if clinically appropriate, and if the patient can be counseled to adequately hold his breath during the examination to allow for optimal image quality. 3. No other acute findings are noted in the chest, abdomen or pelvis to account for the patient's symptoms. 4. Diffuse bronchial wall thickening with moderate centrilobular and paraseptal emphysema; imaging findings suggestive of underlying COPD. 5. Multiple small pulmonary nodules measuring up to 6 mm, stable compared to prior examinations dating back take 02/22/2015, considered benign. No future follow-up needed. This recommendation follows the consensus statement: Guidelines for Management of Incidental Pulmonary Nodules Detected on  CT Images: From the Fleischner Society 2017; Radiology 2017; [id number]. 6. Additional incidental findings, as above. Aortic Atherosclerosis (ICD10-I70.0) and Emphysema (ICD10-J43.9).   ASSESSMENT & PLAN:  Bobby Caldwell is a 62 y.o. [place] male with a history of heavy smoking and COPD, peripheral vascular disease, presents with fatigue, anorexia, early satiety and weight loss   1. Gastric Adenocarcinoma, uT2N0M0, stage I, ypT1bN0 -I reviewed his EGD findings and biopsy pathology results that show gastric adenocarcinoma. He had CT chest, abdomen and pelvis with contrast for his peripheral vascular disease on 04/02/2017, which shows no sign of metastasis at that time.  -His EUS showed a T2N0 lesion in distal gastric body -Due to his intensive peripheral vascular disease and recent bypass surgery, poor nutrition status, Dr. [name] not feel he is a great surgical candidate at this point, she recommended him to improve his nutrition status and to consider neoadjuvant chemotherapy first.  Giving the T2 lesion, neoadjuvant chemotherapy is reasonable and I agree with Dr. [name]. -He received neoadjuvant FOLFOX every 2 weeks for 4 cycles.  We initially planned to full a total of 8 cycles, chemo was stopped earlier due to his poor tolerance, multiple hospital stay. -He had partial gastrectomy with Dr. [name] on 11/27/2017. He is recovering well. He will see Dr. [name] in 2 months.  -I have reviewed his surgical pathology findings, which showed a small T1b lesion, lymph nodes all negative.  We previously discussed in our GI tumor board, given his small lesion, previous poor tolerance to chemotherapy, I did not recommend any adjuvant chemotherapy.  Radiation was also felt to not as necessary. -Discussed small to moderate risk of recurrence after surgery.  We discussed the surveillance plan, plan to repeat variance CT scan annually for 2 to 3 years. -Labs reviewed, CBC and CMP WNLs, clinically doing well, except  dyspnea from COPD. -f/u in 4 months with CT scan before -port flush every 6 weeks  2. COPD, Smoking Cessation  -He reduced from 50 cigars a day to 10 cigars a day. He is actively trying to quit. He has mild chronic cough  -I previously discussed his chest pain is due to his cough from COPD and smoking.  -he is down to 6-7 cigars a day, I again encouraged him to completely quit  -Admitted to the hospital on 09/10/17 for COPD exacerbation  -He is still short of breath. I advised him to see pulmonary and try to quit smoking. I also advised him to take flu shot.  3. Depression and or anxiety -Patient's daughter previously called me after his visit, she is concerned about his personality change lately, he has been agitated, gets upset quickly, and not sleeping well.  Daughter is concerned about depression and anxiety.  I think this is possible related to steroids, and or his side effect from chemo.   -I encouraged daughter to watch him at home, and call us if she  has concerns. -Patient denied depression, he was seen by our Education officer, museum, and he declined counseling. -Has Ativan as needed for insomnia.    PLAN:  -port flush today and every 6 weeks -F/u in 19 weeks with CT BD/PEL w contrast one week before (same day with lab and flush)  -He will see Dr. [name] in 2 to 3 months.  Orders Placed This Encounter  Procedures  . CT Abdomen Pelvis W Contrast    Standing Status:   Future    Standing Expiration Date:   04/04/2019    Order Specific Question:   If indicated for the ordered procedure, I authorize the administration of contrast media per Radiology protocol    Answer:   Yes    Order Specific Question:   Preferred imaging location?    Answer:   [facility]    Order Specific Question:   Is Oral Contrast requested for this exam?    Answer:   Yes, Per Radiology protocol    Order Specific Question:   Radiology Contrast Protocol - do NOT remove file path    Answer:    \\[email or web address]  . CT Chest W Contrast    Standing Status:   Future    Standing Expiration Date:   04/04/2019    Order Specific Question:   If indicated for the ordered procedure, I authorize the administration of contrast media per Radiology protocol    Answer:   Yes    Order Specific Question:   Preferred imaging location?    Answer:   [facility]    Order Specific Question:   Radiology Contrast Protocol - do NOT remove file path    Answer:   \\[email or web address]    All questions were answered. The patient knows to call the clinic with any problems, questions or concerns. I spent 20 minutes counseling the patient face to face. The total time spent in the appointment was 25 minutes and more than 50% was on counseling.  Dierdre Searles Dweik am acting as scribe for Dr. [name].  I have reviewed the above documentation for accuracy and completeness, and I agree with the above.     [name], MD 04/04/2018

## 2018-04-04 ENCOUNTER — Telehealth: Payer: Self-pay

## 2018-04-04 ENCOUNTER — Inpatient Hospital Stay: Payer: Medicaid Other | Attending: Hematology

## 2018-04-04 ENCOUNTER — Encounter: Payer: Self-pay | Admitting: Hematology

## 2018-04-04 ENCOUNTER — Inpatient Hospital Stay (HOSPITAL_BASED_OUTPATIENT_CLINIC_OR_DEPARTMENT_OTHER): Payer: Medicaid Other | Admitting: Hematology

## 2018-04-04 VITALS — BP 149/90 | HR 98 | Temp 98.0°F | Resp 17 | Ht 65.0 in | Wt 131.2 lb

## 2018-04-04 DIAGNOSIS — R634 Abnormal weight loss: Secondary | ICD-10-CM

## 2018-04-04 DIAGNOSIS — C169 Malignant neoplasm of stomach, unspecified: Secondary | ICD-10-CM

## 2018-04-04 DIAGNOSIS — C160 Malignant neoplasm of cardia: Secondary | ICD-10-CM | POA: Diagnosis not present

## 2018-04-04 DIAGNOSIS — F419 Anxiety disorder, unspecified: Secondary | ICD-10-CM | POA: Insufficient documentation

## 2018-04-04 DIAGNOSIS — C162 Malignant neoplasm of body of stomach: Secondary | ICD-10-CM

## 2018-04-04 DIAGNOSIS — F17210 Nicotine dependence, cigarettes, uncomplicated: Secondary | ICD-10-CM | POA: Insufficient documentation

## 2018-04-04 DIAGNOSIS — Z79899 Other long term (current) drug therapy: Secondary | ICD-10-CM | POA: Insufficient documentation

## 2018-04-04 DIAGNOSIS — Z23 Encounter for immunization: Secondary | ICD-10-CM | POA: Diagnosis not present

## 2018-04-04 DIAGNOSIS — J449 Chronic obstructive pulmonary disease, unspecified: Secondary | ICD-10-CM | POA: Insufficient documentation

## 2018-04-04 DIAGNOSIS — F329 Major depressive disorder, single episode, unspecified: Secondary | ICD-10-CM | POA: Insufficient documentation

## 2018-04-04 LAB — CBC WITH DIFFERENTIAL/PLATELET
BASOS ABS: 0.1 10*3/uL (ref 0.0–0.1)
BASOS PCT: 1 %
EOS PCT: 2 %
Eosinophils Absolute: 0.2 10*3/uL (ref 0.0–0.5)
HCT: 45.1 % (ref 38.4–49.9)
Hemoglobin: 15.2 g/dL (ref 13.0–17.1)
LYMPHS PCT: 31 %
Lymphs Abs: 2.5 10*3/uL (ref 0.9–3.3)
MCH: 27.7 pg (ref 27.2–33.4)
MCHC: 33.7 g/dL (ref 32.0–36.0)
MCV: 82.1 fL (ref 79.3–98.0)
Monocytes Absolute: 0.7 10*3/uL (ref 0.1–0.9)
Monocytes Relative: 9 %
NEUTROS ABS: 4.7 10*3/uL (ref 1.5–6.5)
Neutrophils Relative %: 57 %
PLATELETS: 239 10*3/uL (ref 140–400)
RBC: 5.49 MIL/uL (ref 4.20–5.82)
RDW: 15.5 % — AB (ref 11.0–14.6)
WBC: 8.2 10*3/uL (ref 4.0–10.3)

## 2018-04-04 LAB — COMPREHENSIVE METABOLIC PANEL
ALBUMIN: 3.7 g/dL (ref 3.5–5.0)
ALT: 20 U/L (ref 0–44)
AST: 14 U/L — AB (ref 15–41)
Alkaline Phosphatase: 102 U/L (ref 38–126)
Anion gap: 9 (ref 5–15)
BUN: 7 mg/dL — AB (ref 8–23)
CHLORIDE: 105 mmol/L (ref 98–111)
CO2: 27 mmol/L (ref 22–32)
CREATININE: 0.74 mg/dL (ref 0.61–1.24)
Calcium: 9.5 mg/dL (ref 8.9–10.3)
GFR calc Af Amer: >60 mL/min (ref >60)
GLUCOSE: 87 mg/dL (ref 70–99)
POTASSIUM: 3.6 mmol/L (ref 3.5–5.1)
Sodium: 141 mmol/L (ref 135–145)
TOTAL PROTEIN: 7.1 g/dL (ref 6.5–8.1)
Total Bilirubin: 0.4 mg/dL (ref 0.3–1.2)

## 2018-04-04 MED ORDER — SODIUM CHLORIDE 0.9% FLUSH
10.0000 mL | INTRAVENOUS | Status: DC | PRN
  Administered 2018-04-04: 10 mL via INTRAVENOUS
  Filled 2018-04-04: qty 10

## 2018-04-04 MED ORDER — HEPARIN SOD (PORK) LOCK FLUSH 100 UNIT/ML IV SOLN
500.0000 [IU] | Freq: Once | INTRAVENOUS | Status: AC
  Administered 2018-04-04: 500 [IU] via INTRAVENOUS
  Filled 2018-04-04: qty 5

## 2018-04-04 MED ORDER — INFLUENZA VAC SPLIT QUAD 0.5 ML IM SUSY
PREFILLED_SYRINGE | INTRAMUSCULAR | Status: AC
  Filled 2018-04-04: qty 0.5

## 2018-04-04 MED ORDER — INFLUENZA VAC SPLIT QUAD 0.5 ML IM SUSY
0.5000 mL | PREFILLED_SYRINGE | Freq: Once | INTRAMUSCULAR | Status: AC
  Administered 2018-04-04: 0.5 mL via INTRAMUSCULAR

## 2018-04-04 NOTE — Telephone Encounter (Signed)
Printed avs and calender of upcoming appointment per 9/26 los

## 2018-04-11 ENCOUNTER — Inpatient Hospital Stay: Payer: Medicaid Other | Attending: Hematology

## 2018-04-11 DIAGNOSIS — Z5189 Encounter for other specified aftercare: Secondary | ICD-10-CM | POA: Insufficient documentation

## 2018-04-11 DIAGNOSIS — Z95828 Presence of other vascular implants and grafts: Secondary | ICD-10-CM

## 2018-04-11 DIAGNOSIS — C160 Malignant neoplasm of cardia: Secondary | ICD-10-CM | POA: Diagnosis not present

## 2018-04-11 MED ORDER — ALTEPLASE 2 MG IJ SOLR
2.0000 mg | Freq: Once | INTRAMUSCULAR | Status: AC | PRN
  Administered 2018-04-11: 2 mg
  Filled 2018-04-11: qty 2

## 2018-04-11 MED ORDER — HEPARIN SOD (PORK) LOCK FLUSH 100 UNIT/ML IV SOLN
500.0000 [IU] | Freq: Once | INTRAVENOUS | Status: AC | PRN
  Administered 2018-04-11: 500 [IU]
  Filled 2018-04-11: qty 5

## 2018-04-11 MED ORDER — ALTEPLASE 2 MG IJ SOLR
INTRAMUSCULAR | Status: AC
  Filled 2018-04-11: qty 2

## 2018-04-11 MED ORDER — SODIUM CHLORIDE 0.9% FLUSH
10.0000 mL | INTRAVENOUS | Status: DC | PRN
  Administered 2018-04-11: 10 mL
  Filled 2018-04-11: qty 10

## 2018-04-24 ENCOUNTER — Ambulatory Visit: Payer: Medicaid Other | Admitting: Pulmonary Disease

## 2018-04-29 ENCOUNTER — Other Ambulatory Visit: Payer: Self-pay

## 2018-04-29 DIAGNOSIS — I70213 Atherosclerosis of native arteries of extremities with intermittent claudication, bilateral legs: Secondary | ICD-10-CM

## 2018-04-29 DIAGNOSIS — I998 Other disorder of circulatory system: Secondary | ICD-10-CM

## 2018-04-29 DIAGNOSIS — I70229 Atherosclerosis of native arteries of extremities with rest pain, unspecified extremity: Secondary | ICD-10-CM

## 2018-04-29 DIAGNOSIS — I739 Peripheral vascular disease, unspecified: Secondary | ICD-10-CM

## 2018-04-30 ENCOUNTER — Ambulatory Visit (INDEPENDENT_AMBULATORY_CARE_PROVIDER_SITE_OTHER)
Admission: RE | Admit: 2018-04-30 | Discharge: 2018-04-30 | Disposition: A | Discharge status: Home / Self Care | Payer: Medicaid Other | Source: Ambulatory Visit | Attending: Vascular Surgery | Admitting: Vascular Surgery

## 2018-04-30 ENCOUNTER — Ambulatory Visit (INDEPENDENT_AMBULATORY_CARE_PROVIDER_SITE_OTHER): Payer: Medicaid Other | Admitting: Vascular Surgery

## 2018-04-30 ENCOUNTER — Ambulatory Visit (HOSPITAL_COMMUNITY)
Admission: RE | Admit: 2018-04-30 | Discharge: 2018-04-30 | Disposition: A | Discharge status: Home / Self Care | Payer: Medicaid Other | Source: Ambulatory Visit | Attending: Vascular Surgery | Admitting: Vascular Surgery

## 2018-04-30 ENCOUNTER — Encounter: Payer: Self-pay | Admitting: Vascular Surgery

## 2018-04-30 VITALS — BP 133/90 | HR 94 | Temp 97.0°F | Resp 16 | Ht 65.0 in | Wt 127.9 lb

## 2018-04-30 DIAGNOSIS — I70229 Atherosclerosis of native arteries of extremities with rest pain, unspecified extremity: Secondary | ICD-10-CM

## 2018-04-30 DIAGNOSIS — I70213 Atherosclerosis of native arteries of extremities with intermittent claudication, bilateral legs: Secondary | ICD-10-CM | POA: Insufficient documentation

## 2018-04-30 DIAGNOSIS — I998 Other disorder of circulatory system: Secondary | ICD-10-CM

## 2018-04-30 DIAGNOSIS — I739 Peripheral vascular disease, unspecified: Secondary | ICD-10-CM

## 2018-04-30 NOTE — Progress Notes (Signed)
Patient name: Bobby Caldwell MRN: [id number] DOB: April 15, 1956 Sex: male  REASON FOR VISIT: 38-month follow-up with surveillance for left iliac stent and femorofemoral bypass  HPI: Bobby Caldwell is a 62 y.o. male that presents for 28-month interval follow-up for ongoing surveillance of his left iliac stent and left to right femoral-femoral bypass in the setting of intermittent claudication with rest pain.  Patient reports no new issues in the last 3 months.  He states he does not walk much due to shortness of breath but what little walking he does he has no pain in his legs at all.  No new complaints or concerns today.  Feels his bypass is working well.    Past Medical History:  Diagnosis Date  . COPD (chronic obstructive pulmonary disease) ([place])   . Dyspnea   . PAD (peripheral artery disease) ([place])   . Stomach cancer ([place])   . Tobacco abuse   . Weight loss     Past Surgical History:  Procedure Laterality Date  . ABDOMINAL AORTOGRAM W/LOWER EXTREMITY N/A 05/03/2017   Procedure: ABDOMINAL AORTOGRAM W/LOWER EXTREMITY;  Surgeon: Conrad Savage, MD;  Location: [facility];  Service: Cardiovascular;  Laterality: N/A;  . ESOPHAGOGASTRODUODENOSCOPY     05/24/17  . ESOPHAGOGASTRODUODENOSCOPY (EGD) WITH PROPOFOL N/A 05/24/2017   Procedure: ESOPHAGOGASTRODUODENOSCOPY (EGD) WITH PROPOFOL;  Surgeon: [name], MD;  Location: [facility];  Service: Endoscopy;  Laterality: N/A;  . EUS N/A 06/07/2017   Procedure: UPPER ENDOSCOPIC ULTRASOUND (EUS) RADIAL;  Surgeon: [name], MD;  Location: [facility];  Service: Endoscopy;  Laterality: N/A;  . FEMORAL-FEMORAL BYPASS GRAFT Bilateral 05/08/2017   Procedure: LEFT TO RIGHT BYPASS GRAFT FEMORAL-FEMORAL ARTERY;  Surgeon: Conrad Guys Mills, MD;  Location: [facility];  Service: Vascular;  Laterality: Bilateral;  . IR FLUORO GUIDE PORT INSERTION RIGHT  07/05/2017  . IR US GUIDE VASC ACCESS RIGHT  07/05/2017  . LAPAROSCOPIC INSERTION GASTROSTOMY TUBE N/A  11/27/2017   Procedure: JEJUNOSTOMY TUBE PLACEMENT;  Surgeon: [name], MD;  Location: [facility];  Service: General;  Laterality: N/A;  . LAPAROSCOPIC PARTIAL GASTRECTOMY N/A 11/27/2017   Procedure: PARTIAL GASTRECTOMY;  Surgeon: [name], MD;  Location: [facility];  Service: General;  Laterality: N/A;  . LAPAROSCOPY N/A 11/27/2017   Procedure: LAPAROSCOPY DIAGNOSTIC;  Surgeon: [name], MD;  Location: [facility];  Service: General;  Laterality: N/A;  . NM MYOVIEW LTD  06/2017   LOW RISK. Small basal inferoseptal defect thought to be related to artifact (although cannot exclude prior infarct). Normal wall motion i in this area would suggest artifact not infarct. Otherwise no ischemia or infarction.  Bobby Kitchen PERIPHERAL VASCULAR INTERVENTION Left 05/03/2017   Procedure: PERIPHERAL VASCULAR INTERVENTION;  Surgeon: Conrad Crenshaw, MD;  Location: [facility];  Service: Cardiovascular;  Laterality: Left;  common iliac  . TRANSTHORACIC ECHOCARDIOGRAM  06/26/2017   Normal LV size and function. EF 60-65%. No wall motion abnormalities. GR 1 DD. Mild LA dilation    Family History  Family history unknown: Yes    SOCIAL HISTORY: Social History   Tobacco Use  . Smoking status: Current Every Day Smoker    Packs/day: 0.50    Years: 50.00    Pack years: 25.00    Types: Cigarettes    Last attempt to quit: 11/2017    Years since quitting: 0.4  . Smokeless tobacco: Never Used  . Tobacco comment: 3 cig per day  Substance Use Topics  . Alcohol use: No  Alcohol/week: 0.0 standard drinks    No Known Allergies  Current Outpatient Medications  Medication Sig Dispense Refill  . acetaminophen (TYLENOL) 325 MG tablet Take 2 tablets (650 mg total) by mouth every 6 (six) hours as needed for mild pain (or Fever >/= 101). 100 tablet 0  . albuterol (PROAIR HFA) 108 (90 Base) MCG/ACT inhaler Inhale 1-2 puffs into the lungs every 6 (six) hours as needed for wheezing or shortness of breath. 1 Inhaler 3  .  atorvastatin (LIPITOR) 10 MG tablet Take 1 tablet (10 mg total) by mouth daily. 30 tablet 2  . budesonide-formoterol (SYMBICORT) 160-4.5 MCG/ACT inhaler Inhale 2 puffs into the lungs 2 (two) times daily. 1 Inhaler 6  . clopidogrel (PLAVIX) 75 MG tablet Take 1 tablet (75 mg total) by mouth daily. 30 tablet 11  . dronabinol (MARINOL) 2.5 MG capsule Take 1 capsule (2.5 mg total) by mouth 2 (two) times daily before a meal. 20 capsule 0  . ipratropium-albuterol (DUONEB) 0.5-2.5 (3) MG/3ML SOLN Take 3 mLs by nebulization 4 (four) times daily. 360 mL 4  . megestrol (MEGACE) 40 MG/ML suspension Take 5 mLs (200 mg total) by mouth 2 (two) times daily. 240 mL 3  . metoCLOPramide (REGLAN) 5 MG tablet Take 1 tablet (5 mg total) by mouth 3 (three) times daily before meals. 60 tablet 0  . nicotine (NICODERM CQ - DOSED IN MG/24 HOURS) 14 mg/24hr patch Place 2 patches (28 mg total) onto the skin daily. 21 patch 0  . ondansetron (ZOFRAN) 8 MG tablet Take 1 tablet (8 mg total) by mouth 2 (two) times daily as needed for refractory nausea / vomiting. Start on day 3 after chemotherapy. 30 tablet 1  . oxyCODONE-acetaminophen (ROXICET) 5-325 MG tablet Take 1 tablet by mouth every 6 (six) hours as needed for severe pain. 20 tablet 0  . pantoprazole (PROTONIX) 40 MG tablet Take 1 tablet (40 mg total) by mouth daily at 12 noon. 30 tablet 0  . polyethylene glycol (MIRALAX / GLYCOLAX) packet Take 17 g by mouth daily. 14 each 0  . prochlorperazine (COMPAZINE) 10 MG tablet Take 1 tablet (10 mg total) by mouth every 6 (six) hours as needed (Nausea or vomiting). 30 tablet 1  . senna-docusate (SENOKOT-S) 8.6-50 MG tablet Take 1 tablet by mouth 2 (two) times daily. 20 tablet 0  . tiotropium (SPIRIVA) 18 MCG inhalation capsule Place 1 capsule (18 mcg total) into inhaler and inhale daily. 30 capsule 6  . Tiotropium Bromide Monohydrate (SPIRIVA RESPIMAT) 2.5 MCG/ACT AERS Inhale 2 puffs into the lungs daily. 1 Inhaler 6  . Tiotropium  Bromide Monohydrate (SPIRIVA RESPIMAT) 2.5 MCG/ACT AERS Inhale 2 puffs into the lungs daily. 1 Inhaler 0  . traMADol (ULTRAM) 50 MG tablet Take 1 tablet (50 mg total) by mouth every 6 (six) hours as needed for moderate pain. 30 tablet 1   No current facility-administered medications for this visit.     REVIEW OF SYSTEMS:  [X]  denotes positive finding, [ ]  denotes negative finding Cardiac  Comments:  Chest pain or chest pressure:    Shortness of breath upon exertion:    Short of breath when lying flat:    Irregular heart rhythm:        Vascular    Pain in calf, thigh, or hip brought on by ambulation:    Pain in feet at night that wakes you up from your sleep:     Blood clot in your veins:    Leg swelling:  Pulmonary    Oxygen at home:    Productive cough:     Wheezing:         Neurologic    Sudden weakness in arms or legs:     Sudden numbness in arms or legs:     Sudden onset of difficulty speaking or slurred speech:    Temporary loss of vision in one eye:     Problems with dizziness:         Gastrointestinal    Blood in stool:     Vomited blood:         Genitourinary    Burning when urinating:     Blood in urine:        Psychiatric    Major depression:         Hematologic    Bleeding problems:    Problems with blood clotting too easily:        Skin    Rashes or ulcers:        Constitutional    Fever or chills:      PHYSICAL EXAM: Vitals:   04/30/18 1034  BP: 133/90  Pulse: 94  Resp: 16  Temp: (!) 97 F (36.1 C)  TempSrc: Oral  SpO2: 97%  Weight: 58 kg  Height: 5\' 5"  (1.651 m)    GENERAL: The patient is a well-nourished male, in no acute distress. The vital signs are documented above. CARDIAC: There is a regular rate and rhythm.  VASCULAR:  2+ femoral pulse in bilateral groins with well healed incisions Palpable pulse in femoral femoral bypass graft Palpable DP pulse bilateral lower extremities  PULMONARY: There is good air exchange  bilaterally without wheezing or rales. ABDOMEN: Soft and non-tender with normal pitched bowel sounds.  MUSCULOSKELETAL: There are no major deformities or cyanosis. NEUROLOGIC: No focal weakness or paresthesias are detected. PSYCHIATRIC: The patient has a normal affect.  DATA:   I independently reviewed his noninvasive imaging and this shows a patent left to right femoral-femoral bypass.  He does have an area of moderate stenosis at the inflow that is is a peak systolic velocity of 841 today from 208 approximately 3 months ago.  His lower extremity ABIs are normal 1.02 on the right and 0.98 on the left with triphasic waveforms.  The left iliac stenosis was not identified today and he has triphasic waveforms in the iliac stent.  Assessment/Plan:  62 year old male presents for ongoing interval surveillance for left common iliac stent with a left to right femoral-femoral bypass for claudication and intermittent rest pain.  He reports no issues over the last 3 months and has no symptoms in his lower extremities.  He has palpable femoral pulses bilaterally with a nice pulse in the graft that is appreciable on exam.  His ABIs are normal with triphasic waveforms at his ankle.  The two areas we are monitoring as an area of concern for stenosis are his left common iliac stent (no evidence of that on today's duplex) and he has a area of moderate stenosis of the inflow of his left to right femorofemoral bypass (but those velocities are unchanged today).  Given that he is asymptomatic and has a normal exam recommended a follow-up again in 3 months with repeat ABIs, femoral-femoral duplex, and left iliac duplex.   Marty Heck, MD [facility] Office: [phone] Pager: [place]

## 2018-05-03 ENCOUNTER — Other Ambulatory Visit: Payer: Self-pay

## 2018-05-03 DIAGNOSIS — I70213 Atherosclerosis of native arteries of extremities with intermittent claudication, bilateral legs: Secondary | ICD-10-CM

## 2018-05-03 DIAGNOSIS — C169 Malignant neoplasm of stomach, unspecified: Secondary | ICD-10-CM

## 2018-05-03 DIAGNOSIS — I70229 Atherosclerosis of native arteries of extremities with rest pain, unspecified extremity: Secondary | ICD-10-CM

## 2018-05-03 DIAGNOSIS — I739 Peripheral vascular disease, unspecified: Secondary | ICD-10-CM

## 2018-05-03 DIAGNOSIS — I998 Other disorder of circulatory system: Secondary | ICD-10-CM

## 2018-05-23 ENCOUNTER — Inpatient Hospital Stay: Payer: Medicaid Other | Attending: Hematology | Admitting: *Deleted

## 2018-05-23 DIAGNOSIS — Z95828 Presence of other vascular implants and grafts: Secondary | ICD-10-CM

## 2018-05-23 DIAGNOSIS — Z452 Encounter for adjustment and management of vascular access device: Secondary | ICD-10-CM | POA: Insufficient documentation

## 2018-05-23 DIAGNOSIS — C160 Malignant neoplasm of cardia: Secondary | ICD-10-CM | POA: Insufficient documentation

## 2018-05-23 MED ORDER — SODIUM CHLORIDE 0.9% FLUSH
10.0000 mL | Freq: Once | INTRAVENOUS | Status: AC
  Administered 2018-05-23: 10 mL
  Filled 2018-05-23: qty 10

## 2018-05-23 MED ORDER — HEPARIN SOD (PORK) LOCK FLUSH 100 UNIT/ML IV SOLN
500.0000 [IU] | Freq: Once | INTRAVENOUS | Status: AC
  Administered 2018-05-23: 500 [IU]
  Filled 2018-05-23: qty 5

## 2018-06-13 ENCOUNTER — Other Ambulatory Visit: Payer: Medicaid Other

## 2018-06-14 ENCOUNTER — Other Ambulatory Visit: Payer: Self-pay | Admitting: Hematology

## 2018-06-19 ENCOUNTER — Telehealth: Payer: Self-pay | Admitting: Hematology

## 2018-06-19 NOTE — Telephone Encounter (Signed)
R/s appt per 12/6 sch message - left message and sent reminder letter in the mail with appt date and time

## 2018-06-20 ENCOUNTER — Ambulatory Visit: Payer: Medicaid Other | Admitting: Hematology

## 2018-06-26 ENCOUNTER — Other Ambulatory Visit: Payer: Self-pay

## 2018-07-02 ENCOUNTER — Other Ambulatory Visit: Payer: Medicaid Other

## 2018-07-04 ENCOUNTER — Other Ambulatory Visit: Payer: Medicaid Other

## 2018-07-11 ENCOUNTER — Encounter: Payer: Self-pay | Admitting: Pulmonary Disease

## 2018-07-11 ENCOUNTER — Ambulatory Visit: Payer: Medicaid Other | Admitting: Pulmonary Disease

## 2018-07-11 ENCOUNTER — Other Ambulatory Visit: Payer: Medicaid Other

## 2018-07-11 VITALS — BP 110/70 | HR 72 | Ht 65.0 in | Wt 128.0 lb

## 2018-07-11 DIAGNOSIS — J449 Chronic obstructive pulmonary disease, unspecified: Secondary | ICD-10-CM | POA: Diagnosis not present

## 2018-07-11 MED ORDER — BUPROPION HCL ER (SMOKING DET) 150 MG PO TB12: 150.0000 mg | ORAL_TABLET | Freq: Two times a day (BID) | ORAL | 1 refills | Status: AC

## 2018-07-11 NOTE — Progress Notes (Signed)
Bobby Caldwell    [id number]    1955-11-24  Primary Care Physician:Patient, No Pcp Per  Referring Physician: No referring provider defined for this encounter.  Chief complaint:   Follow-up of chronic obstructive pulmonary disease  HPI:  Patient with COPD who had recently increased his cigarette use Was following up with Dr. 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 acetaminophen (TYLENOL) 325 MG tablet Take 2 tablets (650 mg total) by mouth every 6 (six) hours as needed for mild pain (or Fever >/= 101).  Marland Kitchen albuterol (PROAIR HFA) 108 (90 Base) MCG/ACT inhaler Inhale 1-2 puffs into the lungs every 6 (six) hours as needed for wheezing or shortness of breath.  Marland Kitchen atorvastatin (LIPITOR) 10 MG tablet Take 1 tablet (10 mg total) by mouth daily.  . budesonide-formoterol (SYMBICORT) 160-4.5 MCG/ACT inhaler Inhale 2 puffs into the lungs 2 (two) times daily.  . clopidogrel (PLAVIX) 75 MG tablet Take 1 tablet (75 mg total) by mouth daily.  Marland Kitchen dronabinol (MARINOL) 2.5 MG capsule Take 1 capsule (2.5 mg total) by mouth 2 (two) times daily before a meal.  . ipratropium-albuterol (DUONEB) 0.5-2.5 (3) MG/3ML SOLN Take 3 mLs by nebulization 4  (four) times daily.  . megestrol (MEGACE) 40 MG/ML suspension Take 5 mLs (200 mg total) by mouth 2 (two) times daily.  . metoCLOPramide (REGLAN) 5 MG tablet Take 1 tablet (5 mg total) by mouth 3 (three) times daily before meals.  . nicotine (NICODERM CQ - DOSED IN MG/24 HOURS) 14 mg/24hr patch Place 2 patches (28 mg total) onto the skin daily.  . ondansetron (ZOFRAN) 8 MG tablet Take 1 tablet (8 mg total) by mouth 2 (two) times daily as needed for refractory nausea / vomiting. Start on day 3 after chemotherapy.  Marland Kitchen oxyCODONE-acetaminophen (ROXICET) 5-325 MG tablet Take 1 tablet by mouth every 6 (six) hours as needed for severe pain.  . pantoprazole (PROTONIX) 40 MG tablet Take 1 tablet (40 mg total) by mouth daily at 12 noon.  . polyethylene glycol (MIRALAX / GLYCOLAX) packet Take 17 g by mouth daily.  . prochlorperazine (COMPAZINE) 10 MG tablet Take 1 tablet (10 mg total) by mouth every 6 (six) hours as needed (Nausea or vomiting).  Marland Kitchen senna-docusate (SENOKOT-S) 8.6-50 MG tablet Take 1 tablet by mouth 2 (two) times daily.  Marland Kitchen tiotropium (SPIRIVA) 18 MCG inhalation capsule Place 1 capsule (18 mcg total) into inhaler and inhale daily.  . Tiotropium Bromide Monohydrate (SPIRIVA RESPIMAT) 2.5 MCG/ACT AERS Inhale 2 puffs into the lungs daily.  . Tiotropium Bromide Monohydrate (SPIRIVA RESPIMAT) 2.5 MCG/ACT AERS Inhale 2 puffs into the lungs daily.  . traMADol (ULTRAM) 50 MG tablet Take 1 tablet (50 mg total) by mouth every 6 (six) hours as needed  for moderate pain.  Marland Kitchen buPROPion (ZYBAN) 150 MG 12 hr tablet Take 1 tablet (150 mg total) by mouth 2 (two) times daily. 1 tab daily for 3 days then 1 tab BID   No facility-administered encounter medications on file as of 07/11/2018.     Allergies as of 07/11/2018  . (No Known Allergies)    Past Medical History:  Diagnosis Date  . COPD (chronic obstructive pulmonary disease) ([place])   . Dyspnea   . PAD (peripheral artery disease) ([place])   . Stomach cancer  ([place])   . Tobacco abuse   . Weight loss     Past Surgical History:  Procedure Laterality Date  . ABDOMINAL AORTOGRAM W/LOWER EXTREMITY N/A 05/03/2017   Procedure: ABDOMINAL AORTOGRAM W/LOWER EXTREMITY;  Surgeon: Conrad [place], MD;  Location: [facility];  Service: Cardiovascular;  Laterality: N/A;  . ESOPHAGOGASTRODUODENOSCOPY     05/24/17  . ESOPHAGOGASTRODUODENOSCOPY (EGD) WITH PROPOFOL N/A 05/24/2017   Procedure: ESOPHAGOGASTRODUODENOSCOPY (EGD) WITH PROPOFOL;  Surgeon: [name], MD;  Location: [facility];  Service: Endoscopy;  Laterality: N/A;  . EUS N/A 06/07/2017   Procedure: UPPER ENDOSCOPIC ULTRASOUND (EUS) RADIAL;  Surgeon: [name], MD;  Location: [facility];  Service: Endoscopy;  Laterality: N/A;  . FEMORAL-FEMORAL BYPASS GRAFT Bilateral 05/08/2017   Procedure: LEFT TO RIGHT BYPASS GRAFT FEMORAL-FEMORAL ARTERY;  Surgeon: Conrad [place], MD;  Location: [facility];  Service: Vascular;  Laterality: Bilateral;  . IR FLUORO GUIDE PORT INSERTION RIGHT  07/05/2017  . IR US GUIDE VASC ACCESS RIGHT  07/05/2017  . LAPAROSCOPIC INSERTION GASTROSTOMY TUBE N/A 11/27/2017   Procedure: JEJUNOSTOMY TUBE PLACEMENT;  Surgeon: [name], MD;  Location: [facility];  Service: General;  Laterality: N/A;  . LAPAROSCOPIC PARTIAL GASTRECTOMY N/A 11/27/2017   Procedure: PARTIAL GASTRECTOMY;  Surgeon: [name], MD;  Location: [facility];  Service: General;  Laterality: N/A;  . LAPAROSCOPY N/A 11/27/2017   Procedure: LAPAROSCOPY DIAGNOSTIC;  Surgeon: [name], MD;  Location: [facility];  Service: General;  Laterality: N/A;  . NM MYOVIEW LTD  06/2017   LOW RISK. Small basal inferoseptal defect thought to be related to artifact (although cannot exclude prior infarct). Normal wall motion i in this area would suggest artifact not infarct. Otherwise no ischemia or infarction.  Marland Kitchen PERIPHERAL VASCULAR INTERVENTION Left 05/03/2017   Procedure: PERIPHERAL VASCULAR INTERVENTION;  Surgeon: Conrad [place], MD;  Location: [facility];  Service: Cardiovascular;  Laterality: Left;  common iliac  . TRANSTHORACIC ECHOCARDIOGRAM  06/26/2017   Normal LV size and function. EF 60-65%. No wall motion abnormalities. GR 1 DD. Mild LA dilation    Family History  Family history unknown: Yes    Social History   Socioeconomic History  . Marital status: Married    Spouse name: Not on file  . Number of children: 4  . Years of education: Not on file  . Highest education level: Not on file  Occupational History  . Not on file  Social Needs  . Financial resource strain: Not on file  . Food insecurity:    Worry: Not on file    Inability: Not on file  . Transportation needs:    Medical: Not on file    Non-medical: Not on file  Tobacco Use  . Smoking status: Current Every Day Smoker    Packs/day: 0.50    Years: 50.00    Pack years: 25.00    Types: Cigarettes    Last attempt to  quit: 11/2017    Years since quitting: 0.6  . Smokeless tobacco: Never Used  . Tobacco comment: 3 cig per day  Substance and Sexual Activity  . Alcohol use: No    Alcohol/week: 0.0 standard drinks  . Drug use: No  . Sexual activity: Yes  Lifestyle  . Physical activity:    Days per week: Not on file    Minutes per session: Not on file  . Stress: Not on file  Relationships  . Social connections:    Talks on phone: Not on file    Gets together: Not on file    Attends religious service: Not on file    Active member of club or organization: Not on file    Attends meetings of clubs or organizations: Not on file    Relationship status: Not on file  . Intimate partner violence:    Fear of current or ex partner: Not on file    Emotionally abused: Not on file    Physically abused: Not on file    Forced sexual activity: Not on file  Other Topics Concern  . Not on file  Social History Narrative  . Not on file    Review of Systems  Constitutional: Positive for fatigue.  HENT: Negative.   Eyes:  Negative.   Respiratory: Positive for shortness of breath. Negative for cough and chest tightness.   Cardiovascular: Negative.   Gastrointestinal: Negative.     Vitals:   07/11/18 1421  BP: 110/70  Pulse: 72  SpO2: 95%     Physical Exam  Constitutional:  Underweight  HENT:  Head: Normocephalic and atraumatic.  Eyes: Pupils are equal, round, and reactive to light. Conjunctivae and EOM are normal. Right eye exhibits no discharge. Left eye exhibits no discharge.  Neck: Normal range of motion. Neck supple. No tracheal deviation present. No thyromegaly present.  Cardiovascular: Normal rate and regular rhythm.  Pulmonary/Chest: Effort normal and breath sounds normal. No respiratory distress. He has no wheezes. He has no rales. He exhibits no tenderness.  Abdominal: Soft. Bowel sounds are normal. He exhibits no distension. There is no abdominal tenderness.   Data Reviewed: Recent hospital records reviewed  Recent spirometry 11/15/2017 reveals very severe obstructive disease  Assessment:  Advanced chronic obstructive pulmonary disease Active smoker-seeking help to quit History of gastric cancer Severe peripheral arterial disease  Plan/Recommendations:  We will initiate Wellbutrin-150 mg for 3 days then twice daily -Had refused Chantix during his last visit with Dr. Lenna Gilford Has been trying to use a nicotine patch Continue his usual medications Continue bronchodilator treatments Importance of regular physical activity discussed  I will see him back in the office in about 3 months   Sherrilyn Rist MD [facility] 07/11/2018, 2:56 PM  CC: No ref. provider found

## 2018-07-11 NOTE — Patient Instructions (Addendum)
Active smoking For smoking cessation  Zyban -150 daily for 3 days then BID  I will see you back in the office in 3 months  Continue with all your other medications and treatments

## 2018-07-15 NOTE — Progress Notes (Signed)
[name]   [phone]) [phone] [phone]) [phone]   Clinic Follow up Note   Patient Care Team: Patient, No Pcp Per as PCP - General (General Practice) Bobby Space, MD as Consulting Physician (Pulmonary Disease) [name], MD as Attending Physician (Gastroenterology) [name], MD as Consulting Physician (General Surgery) 07/18/2018  CHIEF COMPLAINT: F/u on gastric adenocarcinoma    SUMMARY OF ONCOLOGIC HISTORY: Oncology History   Cancer Staging Gastric cancer Bobby Caldwell) Staging form: Stomach, AJCC 8th Edition - Clinical stage from 05/24/2017: Stage I (cT2, cN0, cM0) - Signed by Bobby Merle, MD on 06/19/2017 - Pathologic stage from 11/27/2017: Stage I (ypT1b, pN0, cM0) - Signed by Bobby Merle, MD on 04/04/2018       Gastric cancer (Bobby Caldwell)   05/24/2017 Procedure    EGD by Dr. [name] 05/24/17 IMPRESSION Ulcerated mass like region in the distal stomach (4cm across), extensively biopsied. This appears neoplastic. - Abnormal mucosa throughout stomach otherwise (mild-moderate gastritis), also extensively biopsied.    05/24/2017 Initial Biopsy    Diagnosis 05/24/17  1. Stomach, biopsy, mass distal - ADENOCARCINOMA, SEE COMMENT. - CHRONIC ACTIVE GASTRITIS WITH INTESTINAL METAPLASIA. 2. 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 - NO DYSPLASIA OR MALIGNANCY    06/04/2017 Initial Diagnosis    Gastric cancer (Bobby Caldwell)    06/07/2017 Procedure    EUS by Dr. [name] on 06/07/17  IMPRESSION:  5cm across, partially circumferential uT2N0 gastric adenocarcinoma laying in the distal stomach 3-4cm proximal to the pylorus.    06/22/2017 Imaging    CT CAP W Contrast 06/22/17  IMPRESSION: 1. The gastric mass is somewhat inconspicuous on CT, but a potential 1.2 cm thick mucosal masses seen along the posterior gastric border on image 83/2. There several small adjacent right gastric  lymph nodes measuring up to 0.7 cm in short axis, but no overtly pathologic adenopathy and no findings of hepatic or peritoneal metastatic disease at this time. 2. Calcified granulomas in both lungs along with several noncalcified nodules stable from August 2016 and likely benign given this long-term stability. 3. Other imaging findings of potential clinical significance: Aortic Atherosclerosis (ICD10-I70.0) and Emphysema (ICD10-J43.9). Airway thickening is present, suggesting bronchitis or reactive airways disease. 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 Disc bulges at L3-4 and L4-5.    07/05/2017 Surgery    PAC placement     07/07/2017 - 09/06/2017 Chemotherapy    Neoadjuvant FOLFOX with with leucovorin every 2 weeks. Plan for 6 cycles but stopped after 4 cycles due to poor tolerance      09/10/2017 Imaging     CT AP W Contrast 09/10/17 IMPRESSION: 1. No acute findings identified within the abdomen or pelvis. 2. Interval development of multiple bilateral pulmonary densities within both lower lobes. These are indeterminate, likely post infectious or inflammatory in etiology. Atypical infection not excluded. Metastatic disease is considered less favored but not entirely excluded and follow-up imaging to ensure resolution is advised. 3. No evidence to suggest recurrent tumor or metastatic disease within the abdomen or pelvis. 4.  Aortic Atherosclerosis (ICD10-I70.0).    11/27/2017 Surgery     Surgery He had partial gastrectomy with Dr. Barry Caldwell on 11/27/2017.    11/27/2017 Pathology Results    11/27/2017 Surgical Pathology Diagnosis 1. Stomach, resection for tumor, Distal w/ Omentum - ADENOCARCINOMA, MODERATELY DIFFERENTIATED, 3.8 CM - LYMPHOVASCULAR Caldwell AND PERINEURAL INVASION - NO CARCINOMA IDENTIFIED IN FOUR LYMPH  NODES (0/4) - SEE ONCOLOGY TABLE AND COMMENT BELOW 2. Lymph node, biopsy, Portal - NO  CARCINOMA IDENTIFIED IN ONE LYMPH NODE (0/1) - SEE COMMENT 3. Lymph node, biopsy, Left Gastric - NO CARCINOMA IDENTIFIED IN TWO LYMPH NODES (0/2) - SEE COMMENT 4. Omentum, resection for tumor - BENIGN FIBROADIPOSE TISSUE - NO CARCINOMA IDENTIFIED     11/27/2017 Cancer Staging    Staging form: Stomach, AJCC 8th Edition - Pathologic stage from 11/27/2017: Stage I (ypT1b, pN0, cM0) - Signed by Bobby Merle, MD on 04/04/2018     CURRENT THERAPY Surveillance   INTERVAL HISTORY: Bobby Caldwell is a 63 y.o. male who is here for follow-up. Today, he is here with his wife and video interpreter. He is doing well, but not eating due to low appetite for 3 months. He lost 6 Ibs in the past 2 months. He has a burning sensation when he swallows and tries to drink sips of water to help his swallow. He denies constipation or abdominal pain. He drinks coke frequently and doesn't have problems swallowing it or soup. Food goes down slowly and sometimes causes discomfort. He says that he has a lot of medications and is not sure if Megace is helping him or not. He still smokes 6 cigarettes a day.   Pertinent positives and negatives of review of systems are listed and detailed within the above HPI.  REVIEW OF SYSTEMS:   Constitutional: Denies fevers, chills (+) weight loss (+) loss of appetite Eyes: Denies blurriness of vision Ears, nose, mouth, throat, and face: Denies mucositis or sore throat Respiratory: Denies cough, dyspnea or wheezes Cardiovascular: Denies palpitation, chest discomfort or lower extremity swelling Gastrointestinal:  Denies nausea, change in bowel habits (+) heartburn (+) dysphagia  Skin: Denies abnormal skin rashes Lymphatics: Denies new lymphadenopathy or easy bruising Neurological:Denies numbness, tingling or new weaknesses Behavioral/Psych: Mood is stable, no new changes  All other systems were reviewed with the patient and are negative.  MEDICAL HISTORY:  Past Medical History:    Diagnosis Date  . COPD (chronic obstructive pulmonary disease) ([place])   . Dyspnea   . PAD (peripheral artery disease) ([place])   . Stomach cancer (Bobby Caldwell)   . Tobacco abuse   . Weight loss     SURGICAL HISTORY: Past Surgical History:  Procedure Laterality Date  . ABDOMINAL AORTOGRAM W/LOWER EXTREMITY N/A 05/03/2017   Procedure: ABDOMINAL AORTOGRAM W/LOWER EXTREMITY;  Surgeon: Conrad [place], MD;  Location: [facility];  Service: Cardiovascular;  Laterality: N/A;  . ESOPHAGOGASTRODUODENOSCOPY     05/24/17  . ESOPHAGOGASTRODUODENOSCOPY (EGD) WITH PROPOFOL N/A 05/24/2017   Procedure: ESOPHAGOGASTRODUODENOSCOPY (EGD) WITH PROPOFOL;  Surgeon: [name], MD;  Location: [facility];  Service: Endoscopy;  Laterality: N/A;  . EUS N/A 06/07/2017   Procedure: UPPER ENDOSCOPIC ULTRASOUND (EUS) RADIAL;  Surgeon: [name], MD;  Location: [facility];  Service: Endoscopy;  Laterality: N/A;  . FEMORAL-FEMORAL BYPASS GRAFT Bilateral 05/08/2017   Procedure: LEFT TO RIGHT BYPASS GRAFT FEMORAL-FEMORAL ARTERY;  Surgeon: Conrad Enoch, MD;  Location: [facility];  Service: Vascular;  Laterality: Bilateral;  . IR FLUORO GUIDE PORT INSERTION RIGHT  07/05/2017  . IR US GUIDE VASC ACCESS RIGHT  07/05/2017  . LAPAROSCOPIC INSERTION GASTROSTOMY TUBE N/A 11/27/2017   Procedure: JEJUNOSTOMY TUBE PLACEMENT;  Surgeon: [name], MD;  Location: [facility];  Service: General;  Laterality: N/A;  . LAPAROSCOPIC PARTIAL GASTRECTOMY N/A 11/27/2017   Procedure: PARTIAL GASTRECTOMY;  Surgeon: [name], MD;  Location: [facility];  Service: General;  Laterality: N/A;  . LAPAROSCOPY N/A 11/27/2017   Procedure: LAPAROSCOPY DIAGNOSTIC;  Surgeon: [name], MD;  Location: [facility];  Service: General;  Laterality: N/A;  . NM MYOVIEW LTD  06/2017   LOW RISK. Small basal inferoseptal defect thought to be related to artifact (although cannot exclude prior infarct). Normal wall motion i in this area would suggest artifact not  infarct. Otherwise no ischemia or infarction.  Marland Kitchen PERIPHERAL VASCULAR INTERVENTION Left 05/03/2017   Procedure: PERIPHERAL VASCULAR INTERVENTION;  Surgeon: Conrad Bussey, MD;  Location: [facility];  Service: Cardiovascular;  Laterality: Left;  common iliac  . TRANSTHORACIC ECHOCARDIOGRAM  06/26/2017   Normal LV size and function. EF 60-65%. No wall motion abnormalities. GR 1 DD. Mild LA dilation    I have reviewed the social history and family history with the patient and they are unchanged from previous note.  ALLERGIES:  has No Known Allergies.  MEDICATIONS:  Current Outpatient Medications  Medication Sig Dispense Refill  . acetaminophen (TYLENOL) 325 MG tablet Take 2 tablets (650 mg total) by mouth every 6 (six) hours as needed for mild pain (or Fever >/= 101). 100 tablet 0  . albuterol (PROAIR HFA) 108 (90 Base) MCG/ACT inhaler Inhale 1-2 puffs into the lungs every 6 (six) hours as needed for wheezing or shortness of breath. 1 Inhaler 3  . atorvastatin (LIPITOR) 10 MG tablet Take 1 tablet (10 mg total) by mouth daily. 30 tablet 2  . budesonide-formoterol (SYMBICORT) 160-4.5 MCG/ACT inhaler Inhale 2 puffs into the lungs 2 (two) times daily. 1 Inhaler 6  . buPROPion (ZYBAN) 150 MG 12 hr tablet Take 1 tablet (150 mg total) by mouth 2 (two) times daily. 1 tab daily for 3 days then 1 tab BID 60 tablet 1  . clopidogrel (PLAVIX) 75 MG tablet Take 1 tablet (75 mg total) by mouth daily. 30 tablet 11  . dronabinol (MARINOL) 2.5 MG capsule Take 1 capsule (2.5 mg total) by mouth 2 (two) times daily before a meal. 20 capsule 0  . ipratropium-albuterol (DUONEB) 0.5-2.5 (3) MG/3ML SOLN Take 3 mLs by nebulization 4 (four) times daily. 360 mL 4  . megestrol (MEGACE) 40 MG/ML suspension Take 5 mLs (200 mg total) by mouth 2 (two) times daily. 240 mL 3  . metoCLOPramide (REGLAN) 5 MG tablet Take 1 tablet (5 mg total) by mouth 3 (three) times daily before meals. 60 tablet 0  . nicotine (NICODERM CQ -  DOSED IN MG/24 HOURS) 14 mg/24hr patch Place 2 patches (28 mg total) onto the skin daily. 21 patch 0  . ondansetron (ZOFRAN) 8 MG tablet Take 1 tablet (8 mg total) by mouth 2 (two) times daily as needed for refractory nausea / vomiting. Start on day 3 after chemotherapy. 30 tablet 1  . oxyCODONE-acetaminophen (ROXICET) 5-325 MG tablet Take 1 tablet by mouth every 6 (six) hours as needed for severe pain. 20 tablet 0  . pantoprazole (PROTONIX) 40 MG tablet Take 1 tablet (40 mg total) by mouth daily at 12 noon. 30 tablet 0  . polyethylene glycol (MIRALAX / GLYCOLAX) packet Take 17 g by mouth daily. 14 each 0  . prochlorperazine (COMPAZINE) 10 MG tablet Take 1 tablet (10 mg total) by mouth every 6 (six) hours as needed (Nausea or vomiting). 30 tablet 1  . senna-docusate (SENOKOT-S) 8.6-50 MG tablet Take 1 tablet by mouth 2 (two) times daily. 20 tablet 0  . tiotropium (SPIRIVA) 18 MCG inhalation capsule Place  1 capsule (18 mcg total) into inhaler and inhale daily. 30 capsule 6  . Tiotropium Bromide Monohydrate (SPIRIVA RESPIMAT) 2.5 MCG/ACT AERS Inhale 2 puffs into the lungs daily. 1 Inhaler 6  . Tiotropium Bromide Monohydrate (SPIRIVA RESPIMAT) 2.5 MCG/ACT AERS Inhale 2 puffs into the lungs daily. 1 Inhaler 0  . traMADol (ULTRAM) 50 MG tablet Take 1 tablet (50 mg total) by mouth every 6 (six) hours as needed for moderate pain. 30 tablet 1   No current facility-administered medications for this visit.     PHYSICAL EXAMINATION: ECOG PERFORMANCE STATUS: 2 - Symptomatic, <50% confined to bed  Vitals:   07/18/18 1000  BP: (!) 136/91  Pulse: 89  Resp: 18  Temp: 97.8 F (36.6 C)  SpO2: 97%   Filed Weights   07/18/18 1000  Weight: 125 lb 1.6 oz (56.7 kg)    GENERAL:alert, no distress and comfortable SKIN: skin color, texture, turgor are normal, no rashes or significant lesions EYES: normal, Conjunctiva are pink and non-injected, sclera clear OROPHARYNX:no exudate, no erythema and lips, buccal  mucosa, and tongue normal  NECK: supple, thyroid normal size, non-tender, without nodularity LYMPH:  no palpable lymphadenopathy in the cervical, axillary or inguinal LUNGS: clear to auscultation and percussion with normal breathing effort HEART: regular rate & rhythm and no murmurs and no lower extremity edema ABDOMEN:abdomen soft, non-tender and normal bowel sounds Musculoskeletal:no cyanosis of digits and no clubbing  NEURO: alert & oriented x 3 with fluent speech, no focal motor/sensory deficits  LABORATORY DATA:  I have reviewed the data as listed CBC Latest Ref Rng & Units 07/18/2018 04/04/2018 12/03/2017  WBC 4.0 - 10.5 K/uL 7.4 8.2 11.6(H)  Hemoglobin 13.0 - 17.0 g/dL 14.5 15.2 12.0(L)  Hematocrit 39.0 - 52.0 % 43.1 45.1 37.0(L)  Platelets 150 - 400 K/uL 241 239 279     CMP Latest Ref Rng & Units 07/18/2018 04/04/2018 12/04/2017  Glucose 70 - 99 mg/dL 99 87 214(H)  BUN 8 - 23 mg/dL 8 7(L) 17  Creatinine 0.61 - 1.24 mg/dL 0.72 0.74 0.52(L)  Sodium 135 - 145 mmol/L 141 141 136  Potassium 3.5 - 5.1 mmol/L 3.3(L) 3.6 5.3(H)  Chloride 98 - 111 mmol/L 106 105 93(L)  CO2 22 - 32 mmol/L 25 27 34(H)  Calcium 8.9 - 10.3 mg/dL 8.9 9.5 9.3  Total Protein 6.5 - 8.1 g/dL 6.6 7.1 -  Total Bilirubin 0.3 - 1.2 mg/dL 0.3 0.4 -  Alkaline Phos 38 - 126 U/L 136(H) 102 -  AST 15 - 41 U/L 16 14(L) -  ALT 0 - 44 U/L 16 20 -      RADIOGRAPHIC STUDIES: I have personally reviewed the radiological images as listed and agreed with the findings in the report. No results found.   ASSESSMENT & PLAN:  Bobby Caldwell is a 63 y.o. male with history of  1. Gastric Adenocarcinoma, uT2N0M0, stage I, ypT1bN0 -Diagnosed in 05/2017. Treated with neoadjuvant chemo and surgery. Currently on surveillance. -He has a surveillance CT scan scheduled for 08/08/2018. -Labs reviewed, CBC is WNLs. CMP pending. -He is complaining of dysphagia, poor appetite, some weight loss and fatigue. I discussed referring to Dr. [name]  for an upper endoscopy, he agrees.  I recommend him to have the CT scan done sooner, he declined.  -Patient wants appetite is stimulant, he was on Megace before, but not sure if he took it or not, or if helped or not.  I refilled for him today.  -I sent a message  to Dr. Barry Caldwell and Dr. [name], to coordinate his care. -f/u in 5 months with labs, he will see Dr. Barry Caldwell in 3 months -Continue cancer surveillance  2. COPD, Smoking Cessation  -I previously advised him to quit smoking. -He smoked 6 cigarettes a day.  I strongly encouraged him to quit smoking completely  3. Depression and or anxiety -Currently on Ativan as needed. Continue and monitor.  4.  Dysphagia, low appetite and weight loss -This been going on in the past few months, he has a pending restaging CT scan -I refilled his Megace -We will refer him back to Dr. [name] for repeated endoscopy  Plan  -I refilled his Megace  -I will call with CT scan results in 3 weeks -I will refer to Dr. [name] for an upper endoscopy -f/u in 5 months with labs, he will see Dr. Barry Caldwell in 3 months  No problem-specific Assessment & Plan notes found for this encounter.   No orders of the defined types were placed in this encounter.  All questions were answered. The patient knows to call the clinic with any problems, questions or concerns. No barriers to learning was detected. I spent 20 minutes counseling the patient face to face. The total time spent in the appointment was 30 minutes and more than 50% was on counseling and review of test results  I, Noor Dweik am acting as scribe for Dr. Truitt Caldwell.  I have reviewed the above documentation for accuracy and completeness, and I agree with the above.     Bobby Merle, MD 07/18/2018

## 2018-07-18 ENCOUNTER — Ambulatory Visit: Payer: Medicaid Other | Admitting: Hematology

## 2018-07-18 ENCOUNTER — Inpatient Hospital Stay: Payer: Medicaid Other | Attending: Hematology

## 2018-07-18 ENCOUNTER — Inpatient Hospital Stay (HOSPITAL_BASED_OUTPATIENT_CLINIC_OR_DEPARTMENT_OTHER): Payer: Medicaid Other | Admitting: Hematology

## 2018-07-18 ENCOUNTER — Inpatient Hospital Stay: Payer: Medicaid Other

## 2018-07-18 ENCOUNTER — Telehealth: Payer: Self-pay

## 2018-07-18 ENCOUNTER — Encounter: Payer: Self-pay | Admitting: Hematology

## 2018-07-18 VITALS — BP 136/91 | HR 89 | Temp 97.8°F | Resp 18 | Ht 65.0 in | Wt 125.1 lb

## 2018-07-18 DIAGNOSIS — F17210 Nicotine dependence, cigarettes, uncomplicated: Secondary | ICD-10-CM

## 2018-07-18 DIAGNOSIS — C160 Malignant neoplasm of cardia: Secondary | ICD-10-CM | POA: Insufficient documentation

## 2018-07-18 DIAGNOSIS — C169 Malignant neoplasm of stomach, unspecified: Secondary | ICD-10-CM

## 2018-07-18 DIAGNOSIS — F419 Anxiety disorder, unspecified: Secondary | ICD-10-CM | POA: Insufficient documentation

## 2018-07-18 DIAGNOSIS — J449 Chronic obstructive pulmonary disease, unspecified: Secondary | ICD-10-CM | POA: Diagnosis not present

## 2018-07-18 DIAGNOSIS — Z79899 Other long term (current) drug therapy: Secondary | ICD-10-CM | POA: Insufficient documentation

## 2018-07-18 DIAGNOSIS — C162 Malignant neoplasm of body of stomach: Secondary | ICD-10-CM

## 2018-07-18 DIAGNOSIS — Z95828 Presence of other vascular implants and grafts: Secondary | ICD-10-CM

## 2018-07-18 DIAGNOSIS — F329 Major depressive disorder, single episode, unspecified: Secondary | ICD-10-CM

## 2018-07-18 LAB — COMPREHENSIVE METABOLIC PANEL
ALK PHOS: 136 U/L — AB (ref 38–126)
ALT: 16 U/L (ref 0–44)
AST: 16 U/L (ref 15–41)
Albumin: 3.5 g/dL (ref 3.5–5.0)
Anion gap: 10 (ref 5–15)
BILIRUBIN TOTAL: 0.3 mg/dL (ref 0.3–1.2)
BUN: 8 mg/dL (ref 8–23)
CALCIUM: 8.9 mg/dL (ref 8.9–10.3)
CO2: 25 mmol/L (ref 22–32)
CREATININE: 0.72 mg/dL (ref 0.61–1.24)
Chloride: 106 mmol/L (ref 98–111)
Glucose, Bld: 99 mg/dL (ref 70–99)
Potassium: 3.3 mmol/L — ABNORMAL LOW (ref 3.5–5.1)
Sodium: 141 mmol/L (ref 135–145)
TOTAL PROTEIN: 6.6 g/dL (ref 6.5–8.1)

## 2018-07-18 LAB — CBC WITH DIFFERENTIAL/PLATELET
Abs Immature Granulocytes: 0.02 10*3/uL (ref 0.00–0.07)
BASOS ABS: 0.1 10*3/uL (ref 0.0–0.1)
Basophils Relative: 1 %
EOS ABS: 0.1 10*3/uL (ref 0.0–0.5)
EOS PCT: 2 %
HCT: 43.1 % (ref 39.0–52.0)
Hemoglobin: 14.5 g/dL (ref 13.0–17.0)
Immature Granulocytes: 0 %
Lymphocytes Relative: 26 %
Lymphs Abs: 1.9 10*3/uL (ref 0.7–4.0)
MCH: 28.2 pg (ref 26.0–34.0)
MCHC: 33.6 g/dL (ref 30.0–36.0)
MCV: 83.9 fL (ref 80.0–100.0)
MONO ABS: 0.6 10*3/uL (ref 0.1–1.0)
Monocytes Relative: 9 %
NRBC: 0 % (ref 0.0–0.2)
Neutro Abs: 4.6 10*3/uL (ref 1.7–7.7)
Neutrophils Relative %: 62 %
Platelets: 241 10*3/uL (ref 150–400)
RBC: 5.14 MIL/uL (ref 4.22–5.81)
RDW: 13.2 % (ref 11.5–15.5)
WBC: 7.4 10*3/uL (ref 4.0–10.5)

## 2018-07-18 MED ORDER — MEGESTROL ACETATE 40 MG/ML PO SUSP: 200.0000 mg | Freq: Two times a day (BID) | ORAL | 3 refills | Status: DC

## 2018-07-18 MED ORDER — HEPARIN SOD (PORK) LOCK FLUSH 100 UNIT/ML IV SOLN
500.0000 [IU] | Freq: Once | INTRAVENOUS | Status: AC
  Administered 2018-07-18: 500 [IU] via INTRAVENOUS
  Filled 2018-07-18: qty 5

## 2018-07-18 MED ORDER — SODIUM CHLORIDE 0.9% FLUSH
10.0000 mL | INTRAVENOUS | Status: DC | PRN
  Administered 2018-07-18: 10 mL via INTRAVENOUS
  Filled 2018-07-18: qty 10

## 2018-07-18 NOTE — Telephone Encounter (Signed)
Printed avs and calender of upcoming appointment. Per 1/9 los 

## 2018-07-19 ENCOUNTER — Ambulatory Visit: Payer: Medicaid Other | Admitting: Hematology

## 2018-07-22 ENCOUNTER — Telehealth: Payer: Self-pay

## 2018-07-22 NOTE — Telephone Encounter (Signed)
07/25/18 appt with Amy Esterwood at 1:30 pm. Pt notified via interpreter.

## 2018-07-22 NOTE — Telephone Encounter (Signed)
-----   Message from [name], MD sent at 07/22/2018  7:17 AM EST ----- We'll get him in.  Thanks  Bevan Disney, He needs ROV with extender first available to consider repeat EGD.  Thanks  ----- Message ----- From: [name], MD Sent: 07/18/2018  10:26 AM EST To: [name], MD, [name], MD  I saw him today, he is having some dysphagia with solid food for a month, with weight loss and low appetite. He is scheduled for CT CAP in 3 weeks but he does not want to do the scan sooner. I refilled his megace  Dorris Fetch, he said you will see him in 3 months, then I will see him in 5-6 months   Dan, could you see him for EGD? Thanks  Krista Blue

## 2018-07-23 ENCOUNTER — Other Ambulatory Visit: Payer: Self-pay

## 2018-07-23 ENCOUNTER — Telehealth: Payer: Self-pay

## 2018-07-23 DIAGNOSIS — C169 Malignant neoplasm of stomach, unspecified: Secondary | ICD-10-CM

## 2018-07-23 MED ORDER — POTASSIUM CHLORIDE CRYS ER 20 MEQ PO TBCR: 20.0000 meq | EXTENDED_RELEASE_TABLET | Freq: Every day | ORAL | 0 refills | Status: DC

## 2018-07-23 NOTE — Telephone Encounter (Signed)
-----   Message from [name], MD sent at 07/21/2018  8:18 PM EST ----- Please let pt know his K was low, please call in KCL 63meq daily for 5 days, thanks  [name]  07/21/2018

## 2018-07-23 NOTE — Telephone Encounter (Signed)
Spoke with patient's daughter regarding his lab results, per Dr. Burr Medico potassium is low, we are sending in a script for Potassium (KCL 20 meq take one daily for 5 days).  She verbalized an understanding and this was sent into the [facility] on file.

## 2018-07-23 NOTE — Progress Notes (Signed)
Sent in KCL to pharmacy.

## 2018-07-25 ENCOUNTER — Encounter: Payer: Self-pay | Admitting: Physician Assistant

## 2018-07-25 ENCOUNTER — Ambulatory Visit (INDEPENDENT_AMBULATORY_CARE_PROVIDER_SITE_OTHER): Payer: Medicaid Other | Admitting: Physician Assistant

## 2018-07-25 VITALS — BP 130/88 | HR 120 | Ht 65.0 in | Wt 125.0 lb

## 2018-07-25 DIAGNOSIS — Z903 Acquired absence of stomach [part of]: Secondary | ICD-10-CM

## 2018-07-25 DIAGNOSIS — R1310 Dysphagia, unspecified: Secondary | ICD-10-CM | POA: Diagnosis not present

## 2018-07-25 DIAGNOSIS — Z85028 Personal history of other malignant neoplasm of stomach: Secondary | ICD-10-CM | POA: Diagnosis not present

## 2018-07-25 DIAGNOSIS — R634 Abnormal weight loss: Secondary | ICD-10-CM | POA: Diagnosis not present

## 2018-07-25 NOTE — Progress Notes (Signed)
Subjective:    Patient ID: Bobby Caldwell, male    DOB: [birth year], 63 y.o.   MRN: [id number]  HPI Bobby Caldwell is a pleasant 63 year old male, known to Dr. [name], who is referred back today by Dr. Burr Medico for evaluation of dysphasia and lack of appetite. Patient has history of hypertension, COPD, chronic respiratory failure who is supposed to be on chronic home O2 but states he has not had access to any oxygen over the past month.  Also with peripheral vascular disease.  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 He underwent a course of chemotherapy and then had partial gastrectomy on 11/27/2017 per Dr. [name].  Surgical path showed a 3.8 cm moderately differentiated adenocarcinoma with lymphovascular and perineural invasion.  Lymph nodes were negative.  He has been on surveillance over the past several months. He had office visit with Dr. Burr Medico last week and says that he has absolutely no appetite recently and his weight is down about 6 pounds over the past couple of months.  He denies any abdominal pain, no melena or hematochezia.  He has been having dysphasia, which is new primarily to solids.  He seems to be able to get liquids down without difficulty but sometimes has a little discomfort.  He has had some episodes of vomiting.  His energy level has been poor. He has just restarted Megace. Labs were done on 07/18/2018 with hemoglobin of 14.5, WBC of 3.3, LFTs within normal limits other than alk phos of 136. He is scheduled to undergo CT scan of the chest abdomen and pelvis on 08/08/2018.   Review of Systems Pertinent positive and negative review of systems were noted in the above HPI section.  All other review of systems was otherwise negative.  Outpatient Encounter Medications as of 07/25/2018  Medication Sig  . acetaminophen (TYLENOL) 325 MG tablet Take 2 tablets (650 mg total) by mouth every 6 (six) hours as needed for mild pain (or Fever >/= 101).  Marland Kitchen albuterol (PROAIR HFA)  108 (90 Base) MCG/ACT inhaler Inhale 1-2 puffs into the lungs every 6 (six) hours as needed for wheezing or shortness of breath.  . budesonide-formoterol (SYMBICORT) 160-4.5 MCG/ACT inhaler Inhale 2 puffs into the lungs 2 (two) times daily.  Marland Kitchen buPROPion (ZYBAN) 150 MG 12 hr tablet Take 1 tablet (150 mg total) by mouth 2 (two) times daily. 1 tab daily for 3 days then 1 tab BID  . ipratropium-albuterol (DUONEB) 0.5-2.5 (3) MG/3ML SOLN Take 3 mLs by nebulization 4 (four) times daily.  . megestrol (MEGACE) 40 MG/ML suspension Take 5 mLs (200 mg total) by mouth 2 (two) times daily.  . nicotine (NICODERM CQ - DOSED IN MG/24 HOURS) 14 mg/24hr patch Place 2 patches (28 mg total) onto the skin daily.  . potassium chloride SA (K-DUR,KLOR-CON) 20 MEQ tablet Take 1 tablet (20 mEq total) by mouth daily. For 5 days  . [DISCONTINUED] atorvastatin (LIPITOR) 10 MG tablet Take 1 tablet (10 mg total) by mouth daily.  . [DISCONTINUED] clopidogrel (PLAVIX) 75 MG tablet Take 1 tablet (75 mg total) by mouth daily.  . [DISCONTINUED] dronabinol (MARINOL) 2.5 MG capsule Take 1 capsule (2.5 mg total) by mouth 2 (two) times daily before a meal.  . [DISCONTINUED] metoCLOPramide (REGLAN) 5 MG tablet Take 1 tablet (5 mg total) by mouth 3 (three) times daily before meals.  . [DISCONTINUED] ondansetron (ZOFRAN) 8 MG tablet Take 1 tablet (8 mg total) by mouth 2 (two) times daily as needed for  refractory nausea / vomiting. Start on day 3 after chemotherapy.  . [DISCONTINUED] oxyCODONE-acetaminophen (ROXICET) 5-325 MG tablet Take 1 tablet by mouth every 6 (six) hours as needed for severe pain.  . [DISCONTINUED] pantoprazole (PROTONIX) 40 MG tablet Take 1 tablet (40 mg total) by mouth daily at 12 noon.  . [DISCONTINUED] polyethylene glycol (MIRALAX / GLYCOLAX) packet Take 17 g by mouth daily.  . [DISCONTINUED] prochlorperazine (COMPAZINE) 10 MG tablet Take 1 tablet (10 mg total) by mouth every 6 (six) hours as needed (Nausea or  vomiting).  . [DISCONTINUED] senna-docusate (SENOKOT-S) 8.6-50 MG tablet Take 1 tablet by mouth 2 (two) times daily.  . [DISCONTINUED] tiotropium (SPIRIVA) 18 MCG inhalation capsule Place 1 capsule (18 mcg total) into inhaler and inhale daily.  . [DISCONTINUED] Tiotropium Bromide Monohydrate (SPIRIVA RESPIMAT) 2.5 MCG/ACT AERS Inhale 2 puffs into the lungs daily.  . [DISCONTINUED] Tiotropium Bromide Monohydrate (SPIRIVA RESPIMAT) 2.5 MCG/ACT AERS Inhale 2 puffs into the lungs daily.  . [DISCONTINUED] traMADol (ULTRAM) 50 MG tablet Take 1 tablet (50 mg total) by mouth every 6 (six) hours as needed for moderate pain.   No facility-administered encounter medications on file as of 07/25/2018.    No Known Allergies Patient Active Problem List   Diagnosis Date Noted  . Chronic hypoxemic respiratory failure ([place]) 03/13/2018  . S/P gastric surgery 01/22/2018  . Nausea 12/20/2017  . Primary adenocarcinoma of pyloric antrum ([place]) 11/27/2017  . S/P vascular surgery 11/15/2017  . Pneumonia 09/10/2017  . Acute on chronic respiratory failure with hypoxia ([place]) 09/10/2017  . Exercise hypoxemia 09/04/2017  . Moderate protein-calorie malnutrition ([place]) 07/24/2017  . Port-A-Cath in place 07/06/2017  . DOE (dyspnea on exertion) 06/12/2017  . Atypical chest pain 06/12/2017  . Essential hypertension 06/12/2017  . Preop cardiovascular exam 06/12/2017  . Gastric cancer ([place]) 06/04/2017  . Gastritis and gastroduodenitis   . Atherosclerosis of native arteries of extremity with intermittent claudication ([place]) 05/03/2017  . Critical lower limb ischemia 05/03/2017  . Abnormal CT of the chest 08/02/2015  . Underweight 03/30/2015  . COPD (chronic obstructive pulmonary disease) with emphysema ([place]) 02/16/2015  . Weight loss 02/16/2015  . Cigarette smoker 02/16/2015   Social History   Socioeconomic History  . Marital status: Married    Spouse name: Not on file  . Number of children: 4  . Years of education:  Not on file  . Highest education level: Not on file  Occupational History  . Not on file  Social Needs  . Financial resource strain: Not on file  . Food insecurity:    Worry: Not on file    Inability: Not on file  . Transportation needs:    Medical: Not on file    Non-medical: Not on file  Tobacco Use  . Smoking status: Current Every Day Smoker    Packs/day: 0.50    Years: 50.00    Pack years: 25.00    Types: Cigarettes    Last attempt to quit: 11/2017    Years since quitting: 0.7  . Smokeless tobacco: Never Used  . Tobacco comment: 3 cig per day  Substance and Sexual Activity  . Alcohol use: No    Alcohol/week: 0.0 standard drinks  . Drug use: No  . Sexual activity: Yes  Lifestyle  . Physical activity:    Days per week: Not on file    Minutes per session: Not on file  . Stress: Not on file  Relationships  . Social connections:    Talks on  phone: Not on file    Gets together: Not on file    Attends religious service: Not on file    Active member of club or organization: Not on file    Attends meetings of clubs or organizations: Not on file    Relationship status: Not on file  . Intimate partner violence:    Fear of current or ex partner: Not on file    Emotionally abused: Not on file    Physically abused: Not on file    Forced sexual activity: Not on file  Other Topics Concern  . Not on file  Social History Narrative  . Not on file    Bobby Caldwell Family history is unknown by patient.      Objective:    Vitals:   07/25/18 1332  BP: 130/88  Pulse: (!) 120  SpO2: 95%    Physical Exam; well-developed older [place] male in no acute distress.  He is here alone today, able to converse in [place] though some language barrier.,  Height 5 foot 5, weight 125, BMI 20.8.  HEENT; nontraumatic normocephalic EOMI PERRLA sclera anicteric, oral mucosa moist, Cardiovascular; regular rate and rhythm with S1-S2, Pulmonary; decreased breath sounds bilaterally scattered  rhonchi.  Abdomen; soft, bowel sounds are present, he is nontender there is no palpable mass or hepatosplenomegaly.  Rectal; exam not done, Extremities ;no clubbing cyanosis or edema skin warm and dry, Neuropsych; alert and oriented, grossly nonfocal mood and affect appropriate       Assessment & Plan:   #19 63 year old male with history of gastric adenocarcinoma, stage Ib, who is status post partial gastrectomy in May 2019 with path showing a 3.8 cm moderately differentiated adenocarcinoma with lymphovascular and perineural invasion but negative lymph nodes.  He  had preop chemotherapy.  Patient has developed dysphagia primarily to solids over the past 4 to 6 weeks, with some mild intermittent odynophagia and occasional episodes of vomiting.  Appetite has been poor and weight is down 6 pounds.  Rule out progression of gastric adenocarcinoma, esophageal or GE junction stricture, and/or metastatic disease.  #2 COPD/chronic respiratory failure was supposed to be on chronic home O2 #3 hypertension 4.  Peripheral vascular disease  Plan; patient has been scheduled for CT scan of the chest abdomen and pelvis by oncology for January 30. Will schedule for EGD with possible dilation with Dr. [name] at [facility].  Procedure was discussed in detail with the patient including indications risks and benefits and he is agreeable to proceed.  Further plans pending results of above.  I sent a note to the patient's new pulmonologist/Dr.Olalere-to help facilitate getting him a new order for home O2.  Charrie Mcconnon S Ramsey Midgett PA-C 07/25/2018   Cc: No ref. provider found

## 2018-07-25 NOTE — H&P (View-Only) (Signed)
Subjective:    Patient ID: Bobby Caldwell, male    DOB: [birth year], 63 y.o.   MRN: [id number]  HPI Bobby Caldwell is a pleasant 63 year old male, known to Dr. [name], who is referred back today by Dr. Burr Medico for evaluation of dysphasia and lack of appetite. Patient has history of hypertension, COPD, chronic respiratory failure who is supposed to be on chronic home O2 but states he has not had access to any oxygen over the past month.  Also with peripheral vascular disease.  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 He underwent a course of chemotherapy and then had partial gastrectomy on 11/27/2017 per Dr. [name].  Surgical path showed a 3.8 cm moderately differentiated adenocarcinoma with lymphovascular and perineural invasion.  Lymph nodes were negative.  He has been on surveillance over the past several months. He had office visit with Dr. Burr Medico last week and says that he has absolutely no appetite recently and his weight is down about 6 pounds over the past couple of months.  He denies any abdominal pain, no melena or hematochezia.  He has been having dysphasia, which is new primarily to solids.  He seems to be able to get liquids down without difficulty but sometimes has a little discomfort.  He has had some episodes of vomiting.  His energy level has been poor. He has just restarted Megace. Labs were done on 07/18/2018 with hemoglobin of 14.5, WBC of 3.3, LFTs within normal limits other than alk phos of 136. He is scheduled to undergo CT scan of the chest abdomen and pelvis on 08/08/2018.   Review of Systems Pertinent positive and negative review of systems were noted in the above HPI section.  All other review of systems was otherwise negative.  Outpatient Encounter Medications as of 07/25/2018  Medication Sig  . acetaminophen (TYLENOL) 325 MG tablet Take 2 tablets (650 mg total) by mouth every 6 (six) hours as needed for mild pain (or Fever >/= 101).  Marland Kitchen albuterol (PROAIR HFA)  108 (90 Base) MCG/ACT inhaler Inhale 1-2 puffs into the lungs every 6 (six) hours as needed for wheezing or shortness of breath.  . budesonide-formoterol (SYMBICORT) 160-4.5 MCG/ACT inhaler Inhale 2 puffs into the lungs 2 (two) times daily.  Marland Kitchen buPROPion (ZYBAN) 150 MG 12 hr tablet Take 1 tablet (150 mg total) by mouth 2 (two) times daily. 1 tab daily for 3 days then 1 tab BID  . ipratropium-albuterol (DUONEB) 0.5-2.5 (3) MG/3ML SOLN Take 3 mLs by nebulization 4 (four) times daily.  . megestrol (MEGACE) 40 MG/ML suspension Take 5 mLs (200 mg total) by mouth 2 (two) times daily.  . nicotine (NICODERM CQ - DOSED IN MG/24 HOURS) 14 mg/24hr patch Place 2 patches (28 mg total) onto the skin daily.  . potassium chloride SA (K-DUR,KLOR-CON) 20 MEQ tablet Take 1 tablet (20 mEq total) by mouth daily. For 5 days  . [DISCONTINUED] atorvastatin (LIPITOR) 10 MG tablet Take 1 tablet (10 mg total) by mouth daily.  . [DISCONTINUED] clopidogrel (PLAVIX) 75 MG tablet Take 1 tablet (75 mg total) by mouth daily.  . [DISCONTINUED] dronabinol (MARINOL) 2.5 MG capsule Take 1 capsule (2.5 mg total) by mouth 2 (two) times daily before a meal.  . [DISCONTINUED] metoCLOPramide (REGLAN) 5 MG tablet Take 1 tablet (5 mg total) by mouth 3 (three) times daily before meals.  . [DISCONTINUED] ondansetron (ZOFRAN) 8 MG tablet Take 1 tablet (8 mg total) by mouth 2 (two) times daily as needed for  refractory nausea / vomiting. Start on day 3 after chemotherapy.  . [DISCONTINUED] oxyCODONE-acetaminophen (ROXICET) 5-325 MG tablet Take 1 tablet by mouth every 6 (six) hours as needed for severe pain.  . [DISCONTINUED] pantoprazole (PROTONIX) 40 MG tablet Take 1 tablet (40 mg total) by mouth daily at 12 noon.  . [DISCONTINUED] polyethylene glycol (MIRALAX / GLYCOLAX) packet Take 17 g by mouth daily.  . [DISCONTINUED] prochlorperazine (COMPAZINE) 10 MG tablet Take 1 tablet (10 mg total) by mouth every 6 (six) hours as needed (Nausea or  vomiting).  . [DISCONTINUED] senna-docusate (SENOKOT-S) 8.6-50 MG tablet Take 1 tablet by mouth 2 (two) times daily.  . [DISCONTINUED] tiotropium (SPIRIVA) 18 MCG inhalation capsule Place 1 capsule (18 mcg total) into inhaler and inhale daily.  . [DISCONTINUED] Tiotropium Bromide Monohydrate (SPIRIVA RESPIMAT) 2.5 MCG/ACT AERS Inhale 2 puffs into the lungs daily.  . [DISCONTINUED] Tiotropium Bromide Monohydrate (SPIRIVA RESPIMAT) 2.5 MCG/ACT AERS Inhale 2 puffs into the lungs daily.  . [DISCONTINUED] traMADol (ULTRAM) 50 MG tablet Take 1 tablet (50 mg total) by mouth every 6 (six) hours as needed for moderate pain.   No facility-administered encounter medications on file as of 07/25/2018.    No Known Allergies Patient Active Problem List   Diagnosis Date Noted  . Chronic hypoxemic respiratory failure ([facility]) 03/13/2018  . S/P gastric surgery 01/22/2018  . Nausea 12/20/2017  . Primary adenocarcinoma of pyloric antrum ([facility]) 11/27/2017  . S/P vascular surgery 11/15/2017  . Pneumonia 09/10/2017  . Acute on chronic respiratory failure with hypoxia ([facility]) 09/10/2017  . Exercise hypoxemia 09/04/2017  . Moderate protein-calorie malnutrition ([facility]) 07/24/2017  . Port-A-Cath in place 07/06/2017  . DOE (dyspnea on exertion) 06/12/2017  . Atypical chest pain 06/12/2017  . Essential hypertension 06/12/2017  . Preop cardiovascular exam 06/12/2017  . Gastric cancer ([facility]) 06/04/2017  . Gastritis and gastroduodenitis   . Atherosclerosis of native arteries of extremity with intermittent claudication ([facility]) 05/03/2017  . Critical lower limb ischemia 05/03/2017  . Abnormal CT of the chest 08/02/2015  . Underweight 03/30/2015  . COPD (chronic obstructive pulmonary disease) with emphysema ([facility]) 02/16/2015  . Weight loss 02/16/2015  . Cigarette smoker 02/16/2015   Social History   Socioeconomic History  . Marital status: Married    Spouse name: Not on file  . Number of children: 4  . Years of education:  Not on file  . Highest education level: Not on file  Occupational History  . Not on file  Social Needs  . Financial resource strain: Not on file  . Food insecurity:    Worry: Not on file    Inability: Not on file  . Transportation needs:    Medical: Not on file    Non-medical: Not on file  Tobacco Use  . Smoking status: Current Every Day Smoker    Packs/day: 0.50    Years: 50.00    Pack years: 25.00    Types: Cigarettes    Last attempt to quit: 11/2017    Years since quitting: 0.7  . Smokeless tobacco: Never Used  . Tobacco comment: 3 cig per day  Substance and Sexual Activity  . Alcohol use: No    Alcohol/week: 0.0 standard drinks  . Drug use: No  . Sexual activity: Yes  Lifestyle  . Physical activity:    Days per week: Not on file    Minutes per session: Not on file  . Stress: Not on file  Relationships  . Social connections:    Talks on   phone: Not on file    Gets together: Not on file    Attends religious service: Not on file    Active member of club or organization: Not on file    Attends meetings of clubs or organizations: Not on file    Relationship status: Not on file  . Intimate partner violence:    Fear of current or ex partner: Not on file    Emotionally abused: Not on file    Physically abused: Not on file    Forced sexual activity: Not on file  Other Topics Concern  . Not on file  Social History Narrative  . Not on file    Mr. Kaigler Family history is unknown by patient.      Objective:    Vitals:   07/25/18 1332  BP: 130/88  Pulse: (!) 120  SpO2: 95%    Physical Exam; well-developed older [place] male in no acute distress.  He is here alone today, able to converse in [place] though some language barrier.,  Height 5 foot 5, weight 125, BMI 20.8.  HEENT; nontraumatic normocephalic EOMI PERRLA sclera anicteric, oral mucosa moist, Cardiovascular; regular rate and rhythm with S1-S2, Pulmonary; decreased breath sounds bilaterally scattered  rhonchi.  Abdomen; soft, bowel sounds are present, he is nontender there is no palpable mass or hepatosplenomegaly.  Rectal; exam not done, Extremities ;no clubbing cyanosis or edema skin warm and dry, Neuropsych; alert and oriented, grossly nonfocal mood and affect appropriate       Assessment & Plan:   #19 63 year old male with history of gastric adenocarcinoma, stage Ib, who is status post partial gastrectomy in May 2019 with path showing a 3.8 cm moderately differentiated adenocarcinoma with lymphovascular and perineural invasion but negative lymph nodes.  He  had preop chemotherapy.  Patient has developed dysphagia primarily to solids over the past 4 to 6 weeks, with some mild intermittent odynophagia and occasional episodes of vomiting.  Appetite has been poor and weight is down 6 pounds.  Rule out progression of gastric adenocarcinoma, esophageal or GE junction stricture, and/or metastatic disease.  #2 COPD/chronic respiratory failure was supposed to be on chronic home O2 #3 hypertension 4.  Peripheral vascular disease  Plan; patient has been scheduled for CT scan of the chest abdomen and pelvis by oncology for January 30. Will schedule for EGD with possible dilation with Dr. [name] at [facility].  Procedure was discussed in detail with the patient including indications risks and benefits and he is agreeable to proceed.  Further plans pending results of above.  I sent a note to the patient's new pulmonologist/Dr.Olalere-to help facilitate getting him a new order for home O2.  Tod Abrahamsen S Maebry Obrien PA-C 07/25/2018   Cc: No ref. provider found

## 2018-07-25 NOTE — Progress Notes (Signed)
I agree with the above note, plan 

## 2018-07-25 NOTE — Patient Instructions (Addendum)
You have been scheduled for an endoscopy. Please follow written instructions given to you at your visit today. If you use inhalers (even only as needed), please bring them with you on the day of your procedure.  You will get several calls from [facility] regarding the procedure and the time to arrive. . Normal BMI (Body Mass Index- based on height and weight) is between 19 and 25. Your BMI today is Body mass index is 20.8 kg/m. Marland Kitchen Please consider follow up  regarding your BMI with your Primary Care Provider.

## 2018-07-26 ENCOUNTER — Telehealth: Payer: Self-pay | Admitting: Pulmonary Disease

## 2018-07-26 NOTE — Telephone Encounter (Signed)
-----   Message from Laurin Coder, MD sent at 07/25/2018  8:28 PM EST ----- Can we call this patient on [facility] to ascertain about oxygen need.  I do not remember him telling me that he is on oxygen His oxygen was about 95 on [place] during the visit  Dr. Jeannine Kitten last note did indicate that he was ambulated and he did desaturate qualifying for oxygen at 2 L Not sure whether he was set up at some point  Need to follow-up on this

## 2018-07-26 NOTE — Telephone Encounter (Signed)
lmom for patient  I have talked with Leander Rams Lpn she states patient has been on o2 but would possibly like a poc and would need to be quilafied for this

## 2018-07-30 NOTE — Telephone Encounter (Signed)
Attempted to call patient will call back.

## 2018-08-06 ENCOUNTER — Ambulatory Visit (HOSPITAL_COMMUNITY)
Admission: RE | Admit: 2018-08-06 | Discharge: 2018-08-06 | Disposition: A | Discharge status: Home / Self Care | Payer: Medicaid Other | Source: Ambulatory Visit | Attending: Vascular Surgery | Admitting: Vascular Surgery

## 2018-08-06 ENCOUNTER — Other Ambulatory Visit: Payer: Self-pay

## 2018-08-06 ENCOUNTER — Ambulatory Visit (INDEPENDENT_AMBULATORY_CARE_PROVIDER_SITE_OTHER)
Admission: RE | Admit: 2018-08-06 | Discharge: 2018-08-06 | Disposition: A | Discharge status: Home / Self Care | Payer: Medicaid Other | Source: Ambulatory Visit | Attending: Vascular Surgery | Admitting: Vascular Surgery

## 2018-08-06 ENCOUNTER — Ambulatory Visit (INDEPENDENT_AMBULATORY_CARE_PROVIDER_SITE_OTHER): Payer: Medicaid Other | Admitting: Vascular Surgery

## 2018-08-06 ENCOUNTER — Encounter: Payer: Self-pay | Admitting: Vascular Surgery

## 2018-08-06 VITALS — BP 133/93 | HR 100 | Temp 97.5°F | Resp 20 | Ht 65.0 in | Wt 125.0 lb

## 2018-08-06 DIAGNOSIS — I70213 Atherosclerosis of native arteries of extremities with intermittent claudication, bilateral legs: Secondary | ICD-10-CM

## 2018-08-06 DIAGNOSIS — I70229 Atherosclerosis of native arteries of extremities with rest pain, unspecified extremity: Secondary | ICD-10-CM

## 2018-08-06 DIAGNOSIS — I998 Other disorder of circulatory system: Secondary | ICD-10-CM | POA: Diagnosis not present

## 2018-08-06 DIAGNOSIS — I739 Peripheral vascular disease, unspecified: Secondary | ICD-10-CM | POA: Diagnosis not present

## 2018-08-06 NOTE — Progress Notes (Signed)
Patient name: Bobby Caldwell MRN: [id number] DOB: [birth year] Sex: male  REASON FOR VISIT: 87-month follow-up with surveillance for left iliac stent and left to right femorofemoral bypass  HPI: Bobby Caldwell is a 63 y.o. male that presents for 71-month interval follow-up for ongoing surveillance of his left iliac stent and left to right femoral-femoral bypass in the setting of intermittent claudication with rest pain by Dr. Bridgett Larsson in 2018.  Patient reports no new issues in the last 3 months.  He states he does not walk much due to shortness of breath but what little walking he does he has no pain in his legs at all.  No new complaints or concerns today.  Feels his bypass is working well.  States he is going to [place] this summer for several months.  Still smoking intermittently.  Past Medical History:  Diagnosis Date  . COPD (chronic obstructive pulmonary disease) ([place])   . Dyspnea   . PAD (peripheral artery disease) ([place])   . Stomach cancer ([place])   . Tobacco abuse   . Weight loss     Past Surgical History:  Procedure Laterality Date  . ABDOMINAL AORTOGRAM W/LOWER EXTREMITY N/A 05/03/2017   Procedure: ABDOMINAL AORTOGRAM W/LOWER EXTREMITY;  Surgeon: Conrad Bush, MD;  Location: [facility];  Service: Cardiovascular;  Laterality: N/A;  . ESOPHAGOGASTRODUODENOSCOPY     05/24/17  . ESOPHAGOGASTRODUODENOSCOPY (EGD) WITH PROPOFOL N/A 05/24/2017   Procedure: ESOPHAGOGASTRODUODENOSCOPY (EGD) WITH PROPOFOL;  Surgeon: [name], MD;  Location: [facility];  Service: Endoscopy;  Laterality: N/A;  . EUS N/A 06/07/2017   Procedure: UPPER ENDOSCOPIC ULTRASOUND (EUS) RADIAL;  Surgeon: [name], MD;  Location: [facility];  Service: Endoscopy;  Laterality: N/A;  . FEMORAL-FEMORAL BYPASS GRAFT Bilateral 05/08/2017   Procedure: LEFT TO RIGHT BYPASS GRAFT FEMORAL-FEMORAL ARTERY;  Surgeon: Conrad Yellow Pine, MD;  Location: [facility];  Service: Vascular;  Laterality: Bilateral;  . IR FLUORO GUIDE  PORT INSERTION RIGHT  07/05/2017  . IR US GUIDE VASC ACCESS RIGHT  07/05/2017  . LAPAROSCOPIC INSERTION GASTROSTOMY TUBE N/A 11/27/2017   Procedure: JEJUNOSTOMY TUBE PLACEMENT;  Surgeon: [name], MD;  Location: [facility];  Service: General;  Laterality: N/A;  . LAPAROSCOPIC PARTIAL GASTRECTOMY N/A 11/27/2017   Procedure: PARTIAL GASTRECTOMY;  Surgeon: [name], MD;  Location: [facility];  Service: General;  Laterality: N/A;  . LAPAROSCOPY N/A 11/27/2017   Procedure: LAPAROSCOPY DIAGNOSTIC;  Surgeon: [name], MD;  Location: [facility];  Service: General;  Laterality: N/A;  . NM MYOVIEW LTD  06/2017   LOW RISK. Small basal inferoseptal defect thought to be related to artifact (although cannot exclude prior infarct). Normal wall motion i in this area would suggest artifact not infarct. Otherwise no ischemia or infarction.  Marland Kitchen PERIPHERAL VASCULAR INTERVENTION Left 05/03/2017   Procedure: PERIPHERAL VASCULAR INTERVENTION;  Surgeon: Conrad Lacombe, MD;  Location: [facility];  Service: Cardiovascular;  Laterality: Left;  common iliac  . TRANSTHORACIC ECHOCARDIOGRAM  06/26/2017   Normal LV size and function. EF 60-65%. No wall motion abnormalities. GR 1 DD. Mild LA dilation    Family History  Family history unknown: Yes    SOCIAL HISTORY: Social History   Tobacco Use  . Smoking status: Current Every Day Smoker    Packs/day: 0.50    Years: 50.00    Pack years: 25.00    Types: Cigarettes    Last attempt to quit: 11/2017    Years since quitting: 0.7  .  Smokeless tobacco: Never Used  . Tobacco comment: 3 cig per day  Substance Use Topics  . Alcohol use: No    Alcohol/week: 0.0 standard drinks    No Known Allergies  Current Outpatient Medications  Medication Sig Dispense Refill  . acetaminophen (TYLENOL) 325 MG tablet Take 2 tablets (650 mg total) by mouth every 6 (six) hours as needed for mild pain (or Fever >/= 101). 100 tablet 0  . albuterol (PROAIR HFA) 108 (90 Base) MCG/ACT  inhaler Inhale 1-2 puffs into the lungs every 6 (six) hours as needed for wheezing or shortness of breath. 1 Inhaler 3  . budesonide-formoterol (SYMBICORT) 160-4.5 MCG/ACT inhaler Inhale 2 puffs into the lungs 2 (two) times daily. 1 Inhaler 6  . buPROPion (ZYBAN) 150 MG 12 hr tablet Take 1 tablet (150 mg total) by mouth 2 (two) times daily. 1 tab daily for 3 days then 1 tab BID 60 tablet 1  . ipratropium-albuterol (DUONEB) 0.5-2.5 (3) MG/3ML SOLN Take 3 mLs by nebulization 4 (four) times daily. 360 mL 4  . megestrol (MEGACE) 40 MG/ML suspension Take 5 mLs (200 mg total) by mouth 2 (two) times daily. 240 mL 3  . nicotine (NICODERM CQ - DOSED IN MG/24 HOURS) 14 mg/24hr patch Place 2 patches (28 mg total) onto the skin daily. 21 patch 0  . potassium chloride SA (K-DUR,KLOR-CON) 20 MEQ tablet Take 1 tablet (20 mEq total) by mouth daily. For 5 days 5 tablet 0   No current facility-administered medications for this visit.     REVIEW OF SYSTEMS:  [X]  denotes positive finding, [ ]  denotes negative finding Cardiac  Comments:  Chest pain or chest pressure:    Shortness of breath upon exertion:    Short of breath when lying flat:    Irregular heart rhythm:        Vascular    Pain in calf, thigh, or hip brought on by ambulation:    Pain in feet at night that wakes you up from your sleep:     Blood clot in your veins:    Leg swelling:         Pulmonary    Oxygen at home:    Productive cough:     Wheezing:         Neurologic    Sudden weakness in arms or legs:     Sudden numbness in arms or legs:     Sudden onset of difficulty speaking or slurred speech:    Temporary loss of vision in one eye:     Problems with dizziness:         Gastrointestinal    Blood in stool:     Vomited blood:         Genitourinary    Burning when urinating:     Blood in urine:        Psychiatric    Major depression:         Hematologic    Bleeding problems:    Problems with blood clotting too easily:         Skin    Rashes or ulcers:        Constitutional    Fever or chills:      PHYSICAL EXAM: Vitals:   08/06/18 1059  BP: (!) 133/93  Pulse: 100  Resp: 20  Temp: (!) 97.5 F (36.4 C)  SpO2: 96%  Weight: 125 lb (56.7 kg)  Height: 5\' 5"  (1.651 m)    GENERAL: The patient  is a well-nourished male, in no acute distress. The vital signs are documented above. CARDIAC: There is a regular rate and rhythm.  VASCULAR:  2+ femoral pulse in bilateral groins with well healed incisions Palpable pulse in femoral femoral bypass graft Palpable PT pulse bilateral lower extremities , no tissue loss PULMONARY: There is good air exchange bilaterally without wheezing or rales. ABDOMEN: Soft and non-tender with normal pitched bowel sounds. Well healed midline incision. MUSCULOSKELETAL: There are no major deformities or cyanosis.   DATA:   I independently reviewed his noninvasive imaging and this shows a patent left to right femoral-femoral bypass.  He does have an area of moderate stenosis at the inflow that is is a peak systolic velocity of 333 today (previously 214).  No iliac stent stenosis on left.  ABI normal and triphasic at ankle.  Assessment/Plan:  63 year old male presents for ongoing interval surveillance for left common iliac stent with a left to right femoral-femoral bypass for claudication and intermittent rest pain.  He reports no issues over the last 3 months and has no symptoms in his lower extremities.  He has palpable femoral pulses bilaterally with a nice pulse in the graft that is appreciable on exam.  His ABIs are normal with triphasic waveforms at his ankle.   I think we can stretch out his surveillance to 6 months with ABIs, femorofemoral duplex, and aortoiliac duplex.  He remains asymptomatic on follow-up today.  There is a moderate stenosis in the inflow of the femorofemoral in the left groin that is unchanged over the last 3 months.  I discussed the importance of smoking  cessation with him.  Marty Heck, MD [facility] Office: [phone] Pager: [place]

## 2018-08-08 ENCOUNTER — Ambulatory Visit (HOSPITAL_COMMUNITY): Payer: Medicaid Other

## 2018-08-12 NOTE — Telephone Encounter (Signed)
Atc will send letter.

## 2018-08-14 ENCOUNTER — Ambulatory Visit (HOSPITAL_COMMUNITY)
Admission: RE | Admit: 2018-08-14 | Discharge: 2018-08-14 | Disposition: A | Discharge status: Home / Self Care | Payer: Medicaid Other | Source: Ambulatory Visit | Attending: Hematology | Admitting: Hematology

## 2018-08-14 DIAGNOSIS — C169 Malignant neoplasm of stomach, unspecified: Secondary | ICD-10-CM | POA: Diagnosis not present

## 2018-08-14 MED ORDER — SODIUM CHLORIDE (PF) 0.9 % IJ SOLN
INTRAMUSCULAR | Status: AC
  Filled 2018-08-14: qty 50

## 2018-08-14 MED ORDER — IOHEXOL 300 MG/ML  SOLN
30.0000 mL | Freq: Once | INTRAMUSCULAR | Status: AC | PRN
  Administered 2018-08-14: 30 mL via ORAL

## 2018-08-14 MED ORDER — IOHEXOL 300 MG/ML  SOLN
100.0000 mL | Freq: Once | INTRAMUSCULAR | Status: AC | PRN
  Administered 2018-08-14: 100 mL via INTRAVENOUS

## 2018-08-16 ENCOUNTER — Telehealth: Payer: Self-pay

## 2018-08-16 NOTE — Telephone Encounter (Signed)
Left voice message for patient's daughter regarding CT scan results.  Per Dr. Burr Medico. No concern for cancer recurrence, stable small lung nodules like benign due to smoking history, no other concerns.

## 2018-08-19 ENCOUNTER — Telehealth: Payer: Self-pay

## 2018-08-19 NOTE — Telephone Encounter (Signed)
Per (Melissa X) advice. Scheduled patient for maint. Flush. Per 2/10 walk in

## 2018-08-20 ENCOUNTER — Inpatient Hospital Stay: Payer: Medicaid Other | Attending: Hematology

## 2018-08-20 DIAGNOSIS — C160 Malignant neoplasm of cardia: Secondary | ICD-10-CM

## 2018-08-20 DIAGNOSIS — Z452 Encounter for adjustment and management of vascular access device: Secondary | ICD-10-CM | POA: Diagnosis not present

## 2018-08-20 DIAGNOSIS — Z95828 Presence of other vascular implants and grafts: Secondary | ICD-10-CM

## 2018-08-20 MED ORDER — HEPARIN SOD (PORK) LOCK FLUSH 100 UNIT/ML IV SOLN
500.0000 [IU] | Freq: Once | INTRAVENOUS | Status: AC
  Administered 2018-08-20: 500 [IU]
  Filled 2018-08-20: qty 5

## 2018-08-20 MED ORDER — SODIUM CHLORIDE 0.9% FLUSH
10.0000 mL | Freq: Once | INTRAVENOUS | Status: AC
  Administered 2018-08-20: 10 mL
  Filled 2018-08-20: qty 10

## 2018-08-21 ENCOUNTER — Encounter (HOSPITAL_COMMUNITY): Payer: Self-pay | Admitting: *Deleted

## 2018-08-21 NOTE — Anesthesia Preprocedure Evaluation (Addendum)
Anesthesia Evaluation  Patient identified by MRN, date of birth, ID band Patient awake    Reviewed: Allergy & Precautions, NPO status , Patient's Chart, lab work & pertinent test results  History of Anesthesia Complications Negative for: history of anesthetic complications  Airway Mallampati: II  TM Distance: >3 FB Neck ROM: Full    Dental  (+) Teeth Intact, Caps, Dental Advisory Given Multiple caps:   Pulmonary shortness of breath, pneumonia, COPD, Current Smoker, former smoker,   + cough noted   + decreased breath sounds      Cardiovascular hypertension, + Peripheral Vascular Disease and + DOE   Rhythm:Regular Rate:Normal  Echo 2018 Left ventricle: The cavity size was normal. Systolic function was normal. The estimated ejection fraction was in the range of 60% to 65%. Wall motion was normal; there were no regional wall  motion abnormalities. Doppler parameters are consistent with abnormal left ventricular relaxation (grade 1 diastolic dysfunction). - Left atrium: The atrium was mildly dilated.   Neuro/Psych negative neurological ROS     GI/Hepatic Gastric ca    Endo/Other  negative endocrine ROS  Renal/GU negative Renal ROS     Musculoskeletal negative musculoskeletal ROS (+)   Abdominal   Peds  Hematology negative hematology ROS (+)   Anesthesia Other Findings   Reproductive/Obstetrics                             Anesthesia Physical  Anesthesia Plan  ASA: III  Anesthesia Plan: MAC   Post-op Pain Management:    Induction: Intravenous  PONV Risk Score and Plan: 2 and Ondansetron and Propofol infusion  Airway Management Planned:   Additional Equipment:   Intra-op Plan:   Post-operative Plan:   Informed Consent: I have reviewed the patients History and Physical, chart, labs and discussed the procedure including the risks, benefits and alternatives for the proposed  anesthesia with the patient or authorized representative who has indicated his/her understanding and acceptance.     Dental advisory given  Plan Discussed with: CRNA  Anesthesia Plan Comments:         Anesthesia Quick Evaluation  

## 2018-08-22 ENCOUNTER — Ambulatory Visit (HOSPITAL_COMMUNITY)
Admission: RE | Admit: 2018-08-22 | Discharge: 2018-08-22 | Disposition: A | Discharge status: Home / Self Care | Payer: Medicaid Other | Source: Ambulatory Visit | Attending: Gastroenterology | Admitting: Gastroenterology

## 2018-08-22 ENCOUNTER — Ambulatory Visit (HOSPITAL_COMMUNITY): Payer: Medicaid Other | Admitting: Certified Registered Nurse Anesthetist

## 2018-08-22 ENCOUNTER — Other Ambulatory Visit: Payer: Self-pay

## 2018-08-22 ENCOUNTER — Encounter (HOSPITAL_COMMUNITY): Payer: Self-pay | Admitting: *Deleted

## 2018-08-22 ENCOUNTER — Encounter (HOSPITAL_COMMUNITY)
Admission: RE | Disposition: A | Discharge status: Home / Self Care | Payer: Self-pay | Source: Ambulatory Visit | Attending: Gastroenterology

## 2018-08-22 DIAGNOSIS — E440 Moderate protein-calorie malnutrition: Secondary | ICD-10-CM | POA: Insufficient documentation

## 2018-08-22 DIAGNOSIS — Z903 Acquired absence of stomach [part of]: Secondary | ICD-10-CM | POA: Insufficient documentation

## 2018-08-22 DIAGNOSIS — Z79899 Other long term (current) drug therapy: Secondary | ICD-10-CM | POA: Diagnosis not present

## 2018-08-22 DIAGNOSIS — K2970 Gastritis, unspecified, without bleeding: Secondary | ICD-10-CM | POA: Insufficient documentation

## 2018-08-22 DIAGNOSIS — Z79818 Long term (current) use of other agents affecting estrogen receptors and estrogen levels: Secondary | ICD-10-CM | POA: Diagnosis not present

## 2018-08-22 DIAGNOSIS — J9610 Chronic respiratory failure, unspecified whether with hypoxia or hypercapnia: Secondary | ICD-10-CM | POA: Diagnosis not present

## 2018-08-22 DIAGNOSIS — J449 Chronic obstructive pulmonary disease, unspecified: Secondary | ICD-10-CM | POA: Diagnosis not present

## 2018-08-22 DIAGNOSIS — C169 Malignant neoplasm of stomach, unspecified: Secondary | ICD-10-CM | POA: Diagnosis not present

## 2018-08-22 DIAGNOSIS — I10 Essential (primary) hypertension: Secondary | ICD-10-CM | POA: Diagnosis not present

## 2018-08-22 DIAGNOSIS — Z9981 Dependence on supplemental oxygen: Secondary | ICD-10-CM | POA: Diagnosis not present

## 2018-08-22 DIAGNOSIS — R634 Abnormal weight loss: Secondary | ICD-10-CM | POA: Diagnosis not present

## 2018-08-22 DIAGNOSIS — Z9221 Personal history of antineoplastic chemotherapy: Secondary | ICD-10-CM | POA: Insufficient documentation

## 2018-08-22 DIAGNOSIS — C163 Malignant neoplasm of pyloric antrum: Secondary | ICD-10-CM | POA: Diagnosis not present

## 2018-08-22 DIAGNOSIS — I70219 Atherosclerosis of native arteries of extremities with intermittent claudication, unspecified extremity: Secondary | ICD-10-CM | POA: Diagnosis not present

## 2018-08-22 DIAGNOSIS — K3189 Other diseases of stomach and duodenum: Secondary | ICD-10-CM | POA: Insufficient documentation

## 2018-08-22 DIAGNOSIS — Z7951 Long term (current) use of inhaled steroids: Secondary | ICD-10-CM | POA: Diagnosis not present

## 2018-08-22 DIAGNOSIS — R1310 Dysphagia, unspecified: Secondary | ICD-10-CM | POA: Diagnosis not present

## 2018-08-22 HISTORY — PX: ESOPHAGOGASTRODUODENOSCOPY (EGD) WITH PROPOFOL: SHX5813

## 2018-08-22 HISTORY — PX: BIOPSY: SHX5522

## 2018-08-22 SURGERY — ESOPHAGOGASTRODUODENOSCOPY (EGD) WITH PROPOFOL
Anesthesia: Monitor Anesthesia Care

## 2018-08-22 MED ORDER — PROPOFOL 10 MG/ML IV BOLUS
INTRAVENOUS | Status: DC | PRN
  Administered 2018-08-22: 20 mg via INTRAVENOUS
  Administered 2018-08-22: 30 mg via INTRAVENOUS

## 2018-08-22 MED ORDER — OMEPRAZOLE 40 MG PO CPDR: 40.0000 mg | DELAYED_RELEASE_CAPSULE | Freq: Every day | ORAL | 11 refills | Status: DC

## 2018-08-22 MED ORDER — PROPOFOL 500 MG/50ML IV EMUL
INTRAVENOUS | Status: DC | PRN
  Administered 2018-08-22: 125 ug/kg/min via INTRAVENOUS

## 2018-08-22 MED ORDER — PROPOFOL 10 MG/ML IV BOLUS
INTRAVENOUS | Status: AC
  Filled 2018-08-22: qty 60

## 2018-08-22 MED ORDER — LACTATED RINGERS IV SOLN
INTRAVENOUS | Status: AC | PRN
  Administered 2018-08-22: 1000 mL via INTRAVENOUS

## 2018-08-22 MED ORDER — ONDANSETRON HCL 4 MG/2ML IJ SOLN
INTRAMUSCULAR | Status: DC | PRN
  Administered 2018-08-22: 4 mg via INTRAVENOUS

## 2018-08-22 SURGICAL SUPPLY — 14 items

## 2018-08-22 NOTE — Transfer of Care (Signed)
Immediate Anesthesia Transfer of Care Note  Patient: Bobby Caldwell  Procedure(s) Performed: ESOPHAGOGASTRODUODENOSCOPY (EGD) WITH PROPOFOL (N/A ) BIOPSY  Patient Location: Endoscopy Unit  Anesthesia Type:MAC  Level of Consciousness: awake and patient cooperative  Airway & Oxygen Therapy: Patient Spontanous Breathing and Patient connected to nasal cannula oxygen  Post-op Assessment: Report given to RN and Post -op Vital signs reviewed and stable  Post vital signs: Reviewed and stable  Last Vitals:  Vitals Value Taken Time  BP    Temp    Pulse    Resp    SpO2      Last Pain:  Vitals:   08/22/18 0640  TempSrc: Oral  PainSc: 0-No pain         Complications: No apparent anesthesia complications

## 2018-08-22 NOTE — Op Note (Signed)
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 Estimated blood loss:                            None. Estimated Blood Loss:     Estimated blood loss: none. Procedure:                Pre-Anesthesia Assessment:                           - Prior to the procedure, a History and Physical                            was performed, and patient medications and                            allergies were reviewed. The patient's tolerance of                            previous anesthesia was also reviewed. The risks                            and benefits of the procedure and the sedation                            options and risks were discussed with the patient.                            All questions were answered, and informed consent  was obtained. Prior Anticoagulants: The patient has                            taken no previous anticoagulant or antiplatelet                            agents. ASA Grade Assessment: III - A patient with                            severe systemic disease. After reviewing the risks                            and benefits, the patient was deemed in                            satisfactory  condition to undergo the procedure.                           After obtaining informed consent, the endoscope was                            passed under direct vision. Throughout the                            procedure, the patient's blood pressure, pulse, and                            oxygen saturations were monitored continuously. The                            GIF-H190 ([id number]) Olympus gastroscope was                            introduced through the mouth, and advanced to the                            afferent and efferent jejunal loops. The upper GI                            endoscopy was accomplished without difficulty. The                            patient tolerated the procedure well. Scope In: Scope Out: Findings:      The esophagus was slightly tortuous but there were no strictures,       stenosis and the mucosa was normal throughout.      The gastro-jejunostomy site was widely patent, Bilroth II anatomy. There       was a 1.5-2cm region of the anastomosis that was discretely abnormal,       soft but somewhat neoplastic appearing and nodular. I biopsied this       region extensively.      The exam was otherwise without abnormality including evaluation of about       15-20cm of both the afferent and efferent limbs. Impression:  The esophagus was slightly tortuous but there were                            no strictures/stenosis and the mucosa was normal                            throughout.                           The gastro-jejunostomy site was widely patent,                            Bilroth II anatomy. There was a 1.5-2cm region of                            the anastomosis that was discretely abnormal, soft                            but somewhat neoplastic appearing and nodular. I                            biopsied this region extensively. Moderate Sedation:      Not Applicable - Patient had care per Anesthesia. Recommendation:           - Patient  has a contact number available for                            emergencies. The signs and symptoms of potential                            delayed complications were discussed with the                            patient. Return to normal activities tomorrow.                            Written discharge instructions were provided to the                            patient.                           - Resume previous diet.                           - Continue present medications. Continue Megace                            twice daily (as is currently written). Start once                            daily omeprazole (40mg  pills, one pill every                            morning, new script was called in today).                           -  Await pathology results. Procedure Code(s):        --- Professional ---                           [phone], Esophagogastroduodenoscopy, flexible,                            transoral; with biopsy, single or multiple Diagnosis Code(s):        --- Professional ---                           K29.70, Gastritis, unspecified, without bleeding                           R13.10, Dysphagia, unspecified CPT copyright 2018 American Medical Association. All rights reserved. The codes documented in this report are preliminary and upon coder review may  be revised to meet current compliance requirements. [name], MD 08/22/2018 7:48:27 AM This report has been signed electronically. Number of Addenda: 0

## 2018-08-22 NOTE — Anesthesia Postprocedure Evaluation (Signed)
Anesthesia Post Note  Patient: Bobby Caldwell  Procedure(s) Performed: ESOPHAGOGASTRODUODENOSCOPY (EGD) WITH PROPOFOL (N/A ) BIOPSY     Patient location during evaluation: PACU Anesthesia Type: MAC Level of consciousness: awake and alert Pain management: pain level controlled Vital Signs Assessment: post-procedure vital signs reviewed and stable Respiratory status: spontaneous breathing Cardiovascular status: stable Anesthetic complications: no    Last Vitals:  Vitals:   08/22/18 0743 08/22/18 0750  BP: (!) 81/59 120/80  Pulse: (!) 101 92  Resp: (!) 24 (!) 23  Temp: 36.7 C   SpO2: 98% 99%    Last Pain:  Vitals:   08/22/18 0743  TempSrc: Oral  PainSc: 0-No pain                 Nolon Nations

## 2018-08-22 NOTE — Discharge Instructions (Signed)
YOU HAD AN ENDOSCOPIC PROCEDURE TODAY: Refer to the procedure report and other information in the discharge instructions given to you for any specific questions about what was found during the examination. If this information does not answer your questions, please call [place] office at [phone] to clarify.   YOU SHOULD EXPECT: Some feelings of bloating in the abdomen. Passage of more gas than usual. Walking can help get rid of the air that was put into your GI tract during the procedure and reduce the bloating. If you had a lower endoscopy (such as a colonoscopy or flexible sigmoidoscopy) you may notice spotting of blood in your stool or on the toilet paper. Some abdominal soreness may be present for a day or two, also.  DIET: Your first meal following the procedure should be a light meal and then it is ok to progress to your normal diet. A half-sandwich or bowl of soup is an example of a good first meal. Heavy or fried foods are harder to digest and may make you feel nauseous or bloated. Drink plenty of fluids but you should avoid alcoholic beverages for 24 hours. If you had a esophageal dilation, please see attached instructions for diet.    ACTIVITY: Your care partner should take you home directly after the procedure. You should plan to take it easy, moving slowly for the rest of the day. You can resume normal activity the day after the procedure however YOU SHOULD NOT DRIVE, use power tools, machinery or perform tasks that involve climbing or major physical exertion for 24 hours (because of the sedation medicines used during the test).   SYMPTOMS TO REPORT IMMEDIATELY: A gastroenterologist can be reached at any hour. Please call [phone]  for any of the following symptoms:   Following upper endoscopy (EGD, EUS, ERCP, esophageal dilation) Vomiting of blood or coffee ground material  New, significant abdominal pain  New, significant chest pain or pain under the shoulder blades  Painful or  persistently difficult swallowing  New shortness of breath  Black, tarry-looking or red, bloody stools  FOLLOW UP:  If any biopsies were taken you will be contacted by phone or by letter within the next 1-3 weeks. Call [phone]  if you have not heard about the biopsies in 3 weeks.  Please also call with any specific questions about appointments or follow up tests.  

## 2018-08-22 NOTE — Anesthesia Procedure Notes (Signed)
Procedure Name: MAC Date/Time: 08/22/2018 7:23 AM Performed by: Claudia Desanctis, CRNA Pre-anesthesia Checklist: Patient identified, Emergency Drugs available, Suction available and Patient being monitored Patient Re-evaluated:Patient Re-evaluated prior to induction Oxygen Delivery Method: Simple face mask

## 2018-08-22 NOTE — Interval H&P Note (Signed)
History and Physical Interval Note:  08/22/2018 7:08 AM  Bobby Caldwell  has presented today for surgery, with the diagnosis of History of gastric cancer, dysphagia, weight loss  The various methods of treatment have been discussed with the patient and family. After consideration of risks, benefits and other options for treatment, the patient has consented to  Procedure(s): ESOPHAGOGASTRODUODENOSCOPY (EGD) WITH PROPOFOL (N/A) as a surgical intervention .  The patient's history has been reviewed, patient examined, no change in status, stable for surgery.  I have reviewed the patient's chart and labs.  Questions were answered to the patient's satisfaction.     [name]

## 2018-08-23 ENCOUNTER — Encounter (HOSPITAL_COMMUNITY): Payer: Self-pay | Admitting: Gastroenterology

## 2018-09-06 ENCOUNTER — Telehealth: Payer: Self-pay | Admitting: Gastroenterology

## 2018-09-06 NOTE — Telephone Encounter (Signed)
I discussed his case at this week's GI cancer conference.  He has an adenomatous, precancerous polyp at the anastomosis from his previous distal gastrectomy.  The rest of this polyp needs to be removed.   Please call him He needs 1 hour EGD at [facility] with MAC sedation to remove the polyp that I found recently in his stomach.  Next available appointment please.

## 2018-09-06 NOTE — Telephone Encounter (Signed)
Left message on machine to call back  

## 2018-09-09 ENCOUNTER — Other Ambulatory Visit: Payer: Self-pay

## 2018-09-09 DIAGNOSIS — Z85028 Personal history of other malignant neoplasm of stomach: Secondary | ICD-10-CM

## 2018-09-09 DIAGNOSIS — C169 Malignant neoplasm of stomach, unspecified: Secondary | ICD-10-CM

## 2018-09-09 NOTE — Telephone Encounter (Signed)
APPT  MADE FOR 10/03/18 AT 1045 AM [facility] WITH DR JACOBS  Left message on machine to call back

## 2018-09-10 NOTE — Telephone Encounter (Signed)
EUS scheduled, pt instructed and medications reviewed.  Patient instructions mailed to home.  Patient to call with any questions or concerns.  

## 2018-09-24 ENCOUNTER — Telehealth: Payer: Self-pay

## 2018-09-24 ENCOUNTER — Encounter: Payer: Self-pay | Admitting: Adult Health

## 2018-09-24 ENCOUNTER — Ambulatory Visit (INDEPENDENT_AMBULATORY_CARE_PROVIDER_SITE_OTHER): Payer: Medicaid Other | Admitting: Adult Health

## 2018-09-24 VITALS — BP 118/64 | HR 110 | Temp 97.8°F | Ht 65.0 in | Wt 120.0 lb

## 2018-09-24 DIAGNOSIS — J432 Centrilobular emphysema: Secondary | ICD-10-CM | POA: Diagnosis not present

## 2018-09-24 DIAGNOSIS — R636 Underweight: Secondary | ICD-10-CM

## 2018-09-24 DIAGNOSIS — F17210 Nicotine dependence, cigarettes, uncomplicated: Secondary | ICD-10-CM

## 2018-09-24 DIAGNOSIS — R1310 Dysphagia, unspecified: Secondary | ICD-10-CM

## 2018-09-24 MED ORDER — ONDANSETRON HCL 4 MG PO TABS: 4.0000 mg | ORAL_TABLET | Freq: Three times a day (TID) | ORAL | 0 refills | Status: DC | PRN

## 2018-09-24 MED ORDER — IPRATROPIUM-ALBUTEROL 0.5-2.5 (3) MG/3ML IN SOLN: 3.0000 mL | Freq: Four times a day (QID) | RESPIRATORY_TRACT | 4 refills | Status: DC

## 2018-09-24 NOTE — Assessment & Plan Note (Signed)
Smoking cessation  

## 2018-09-24 NOTE — Assessment & Plan Note (Signed)
Ongoing dysphagia ? Etiology  GI is evaluating , recent endo unrevealing with neg path from biopsy.  Continue on GERD rx/diet  Add zofran As needed  For nausea   Refer to GI

## 2018-09-24 NOTE — Telephone Encounter (Signed)
Left a message at patient's dental office for their fax number to send over medical clearance for tooth extraction.  Requested they call back and leave me a message.

## 2018-09-24 NOTE — Assessment & Plan Note (Addendum)
Appears stable w/out flare  Cont on current regimen  Needs PCP , refer to help establish

## 2018-09-24 NOTE — Progress Notes (Signed)
@Patient  ID: Bobby Caldwell, male    DOB: 05/23/1956, 63 y.o.   MRN: [id number]  Chief Complaint  Patient presents with  . Acute Visit    Nausea     Referring provider: No ref. 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  Many of the basilar nodules shown on CT abdomen 09/10/2017 have resolved.   09/24/2018 Acute OV : Nausea  Patient presents for an acute office visit.  Patient walked into the pulmonary clinic today.  He complains of nausea , feels food is getting stuck or moves very slow down throat . After he eats he feels very nauseous . Could throw up but does not . No diarrhea or abdominal pain . No bloody stools.  No meds taken for symptoms.  Patient has a history of gastric adenocarcinoma stage Ib.  He is a status post gastrectomy in May 2019.  Path showed a 3.68 cm moderately differentiated adenocarcinoma with lymphovascular and perineural invasion but negative lymph nodes.  He had preop chemotherapy.  He has had ongoing dysphasia and is been followed by GI.  He underwent an endoscopy on August 22, 2018.  Endo report showed no strictures or stenosis and mucosa was normal throughout.  The gastro jejunostomy site was widely patent.  There was a 1.5 to 2 cm region of the anastomosis that was discretely abnormal, soft.  And a nodular appearing.  This area was biopsied.  Pathology was reviewed and negative for malignant cells.  He was started on omeprazole.  He was continued on Megace twice daily.  Patient says symptoms have not improved.  Patient has severe COPD.  He is on DuoNeb 4 times daily and Symbicort twice  daily.  He does need refills of his DuoNeb nebulizer medication Denies increased cough , fever or change in respiratory symptoms . No recent travel.  Still smoking , discussed cessation .      No Known Allergies  Immunization History  Administered Date(s) Administered  . Influenza Split 03/11/2015  . Influenza,inj,Quad PF,6+ Mos 04/04/2018    Past Medical History:  Diagnosis Date  . COPD (chronic obstructive pulmonary disease) ([place])   . Dyspnea   . PAD (peripheral artery disease) ([place])   . Stomach cancer ([place])   . Tobacco abuse   . Weight loss     Tobacco History: Social History   Tobacco Use  Smoking Status Former Smoker  . Packs/day: 0.50  . Years: 50.00  . Pack years: 25.00  . Types: Cigarettes  . Last attempt to quit: 11/2017  . Years since quitting: 0.8  Smokeless Tobacco Never Used  Tobacco Comment   3 cig per day   Counseling given: Not Answered Comment: 3 cig per day   Outpatient Medications Prior to Visit  Medication Sig Dispense Refill  . acetaminophen (TYLENOL) 325 MG tablet Take 2 tablets (650 mg total) by mouth every 6 (six) hours as needed for mild pain (or Fever >/= 101). 100 tablet 0  . albuterol (PROAIR HFA) 108 (90 Base) MCG/ACT inhaler Inhale 1-2 puffs into the lungs every 6 (six) hours as needed for wheezing or shortness of breath. 1 Inhaler 3  . budesonide-formoterol (SYMBICORT) 160-4.5  MCG/ACT inhaler Inhale 2 puffs into the lungs 2 (two) times daily. 1 Inhaler 6  . buPROPion (ZYBAN) 150 MG 12 hr tablet Take 150 mg by mouth daily.    . megestrol (MEGACE) 40 MG/ML suspension Take 5 mLs (200 mg total) by mouth 2 (two) times daily. 240 mL 3  . nicotine (NICODERM CQ - DOSED IN MG/24 HOURS) 14 mg/24hr patch Place 2 patches (28 mg total) onto the skin daily. (Patient taking differently: Place 14 mg onto the skin every other day. ) 21 patch 0  . omeprazole (PRILOSEC) 40 MG capsule Take 1 capsule (40 mg total) by mouth daily. 30 capsule 11  .  ipratropium-albuterol (DUONEB) 0.5-2.5 (3) MG/3ML SOLN Take 3 mLs by nebulization 4 (four) times daily. 360 mL 4   No facility-administered medications prior to visit.      Review of Systems:   Constitutional:   No  weight loss, night sweats,  Fevers, chills, + fatigue, or  lassitude.  HEENT:   No headaches,  Difficulty swallowing,  Tooth/dental problems, or  Sore throat,                No sneezing, itching, ear ache, nasal congestion, post nasal drip,   CV:  No chest pain,  Orthopnea, PND, swelling in lower extremities, anasarca, dizziness, palpitations, syncope.   GI  No heartburn, indigestion, abdominal pain, nausea, vomiting, diarrhea, change in bowel habits, loss of appetite, bloody stools.   Resp: .  No excess mucus, no productive cough,  No non-productive cough,  No coughing up of blood.  No change in color of mucus.  No wheezing.  No chest wall deformity  Skin: no rash or lesions.  GU: no dysuria, change in color of urine, no urgency or frequency.  No flank pain, no hematuria   MS:  No joint pain or swelling.  No decreased range of motion.  No back pain.    Physical Exam  BP 118/64 (BP Location: Left Arm, Patient Position: Sitting, Cuff Size: Normal)   Pulse (!) 110   Temp 97.8 F (36.6 C) (Oral)   Ht 5\' 5"  (1.651 m)   Wt 120 lb (54.4 kg)   SpO2 95%   BMI 19.97 kg/m   GEN: A/Ox3; pleasant , NAD, thin and frail    HEENT:  [place]/AT,  EACs-clear, TMs-wnl, NOSE-clear, THROAT-clear, no lesions, no postnasal drip or exudate noted.   NECK:  Supple w/ fair ROM; no JVD; normal carotid impulses w/o bruits; no thyromegaly or nodules palpated; no lymphadenopathy.    RESP  Clear  P & A; w/o, wheezes/ rales/ or rhonchi. no accessory muscle use, no dullness to percussion  CARD:  RRR, no m/r/g, no peripheral edema, pulses intact, no cyanosis or clubbing.  GI:   Soft & nt; nml bowel sounds; no organomegaly or masses detected. No guarding or rebound .  Surgical scars noted.    Musco: Warm bil, no deformities or joint swelling noted.   Neuro: alert, no focal deficits noted.    Skin: Warm, no lesions or rashes    Lab Results:  CBC    Component Value Date/Time   WBC 7.4 07/18/2018 0941   RBC 5.14 07/18/2018 0941   HGB 14.5 07/18/2018 0941   HGB 15.0 07/11/2017 0848   HCT 43.1 07/18/2018 0941   HCT 43.7 07/11/2017 0848   PLT 241 07/18/2018 0941   PLT 165 07/11/2017 0848   MCV 83.9 07/18/2018 0941   MCV 86.7 07/11/2017 0848   MCH  28.2 07/18/2018 0941   MCHC 33.6 07/18/2018 0941   RDW 13.2 07/18/2018 0941   RDW 12.8 07/11/2017 0848   LYMPHSABS 1.9 07/18/2018 0941   LYMPHSABS 1.4 07/11/2017 0848   MONOABS 0.6 07/18/2018 0941   MONOABS 0.6 07/11/2017 0848   EOSABS 0.1 07/18/2018 0941   EOSABS 0.2 07/11/2017 0848   BASOSABS 0.1 07/18/2018 0941   BASOSABS 0.1 07/11/2017 0848    BMET    Component Value Date/Time   NA 141 07/18/2018 0941   NA 139 07/11/2017 0848   K 3.3 (L) 07/18/2018 0941   K 3.5 07/11/2017 0848   CL 106 07/18/2018 0941   CO2 25 07/18/2018 0941   CO2 32 (H) 07/11/2017 0848   GLUCOSE 99 07/18/2018 0941   GLUCOSE 109 07/11/2017 0848   BUN 8 07/18/2018 0941   BUN 9.8 07/11/2017 0848   CREATININE 0.72 07/18/2018 0941   CREATININE 0.7 07/11/2017 0848   CALCIUM 8.9 07/18/2018 0941   CALCIUM 9.0 07/11/2017 0848   GFRNONAA >60 07/18/2018 0941   GFRAA >60 07/18/2018 0941    BNP    Component Value Date/Time   BNP 355.3 (H) 09/10/2017 1150    ProBNP    Component Value Date/Time   PROBNP 25.0 03/26/2017 1040    Imaging: No results found.  heparin lock flush 100 unit/mL    Date Action Dose Route User   Discharged on 08/22/2018   Admitted on 08/22/2018   08/20/2018 1424 Given 500 Units Intracatheter [place], Jasmin N, LPN    sodium chloride flush (NS) 0.9 % injection 10 mL    Date Action Dose Route User   Discharged on 08/22/2018   Admitted on 08/22/2018   08/20/2018 1424 Given 10 mL Intracatheter Will Bonnet, LPN      No flowsheet data found.  No results found for: NITRICOXIDE      Assessment & Plan:   Dysphagia Ongoing dysphagia ? Etiology  GI is evaluating , recent endo unrevealing with neg path from biopsy.  Continue on GERD rx/diet  Add zofran As needed  For nausea   Refer to GI    COPD (chronic obstructive pulmonary disease) with emphysema (HCC) Appears stable w/out flare  Cont on current regimen  Needs PCP , refer to help establish     Cigarette smoker Smoking cessation      Rexene Edison, NP 09/24/2018

## 2018-09-24 NOTE — Telephone Encounter (Signed)
Received a call from patient's daughter stating he needs a letter of medical clearance to get a tooth extracted.  Attempted to call her back at the number provided, left voice message that we need name of dental office and their fax number to send over.

## 2018-09-24 NOTE — Patient Instructions (Addendum)
Refer to Gastroenterology - Dr. [name] your stomach specialist.  GERD diet  Continue on Omeprazole 40mg  daily before meal .  May use Zofran 4mg  every 8hr as needed for nausea .  If unable to swallow or swallowing gets worse call back sooner or go to ER .  If vomiting starts will need to call immediately , or go to ER .  Refer for primary care provider .  Continue on Symbicort 2 puffs Twice daily   Continue on Duoneb Four times a day  .  Work no not smoking  Please contact office for sooner follow up if symptoms do not improve or worsen or seek emergency care  Follow up Dr Ander Slade as planned and As needed

## 2018-09-24 NOTE — Telephone Encounter (Signed)
Message for was left for dental office to call me back with their fax number.

## 2018-10-01 ENCOUNTER — Telehealth: Payer: Self-pay | Admitting: Gastroenterology

## 2018-10-01 NOTE — Telephone Encounter (Signed)
Patty, Please call him and explained to him with the ongoing coronavirus pandemic we are being urged to postpone any nonurgent cases.  Please reschedule him for about 1 month from now, mid April.  Hopefully things will have settled down a bit by then.  Thank you

## 2018-10-01 NOTE — Telephone Encounter (Signed)
The pt has been advised and taken off the schedule.  He will be called when the schedule opens up

## 2018-10-03 ENCOUNTER — Ambulatory Visit (HOSPITAL_COMMUNITY): Admission: RE | Admit: 2018-10-03 | Payer: Medicaid Other | Source: Home / Self Care | Admitting: Gastroenterology

## 2018-10-03 ENCOUNTER — Encounter (HOSPITAL_COMMUNITY): Admission: RE | Payer: Self-pay | Source: Home / Self Care

## 2018-10-03 SURGERY — ESOPHAGOGASTRODUODENOSCOPY (EGD) WITH PROPOFOL
Anesthesia: Monitor Anesthesia Care

## 2018-10-10 ENCOUNTER — Ambulatory Visit: Payer: Medicaid Other | Admitting: Pulmonary Disease

## 2018-11-11 ENCOUNTER — Telehealth: Payer: Self-pay

## 2018-11-11 NOTE — Telephone Encounter (Signed)
Left message on machine to call back need to add orders only and amb ref. As well as instructions

## 2018-11-11 NOTE — Telephone Encounter (Signed)
[facility] EGD Dr Lenna Sciara 11/28/18 at 830 am

## 2018-11-12 ENCOUNTER — Other Ambulatory Visit: Payer: Self-pay

## 2018-11-12 DIAGNOSIS — K3189 Other diseases of stomach and duodenum: Secondary | ICD-10-CM

## 2018-11-12 DIAGNOSIS — Z85028 Personal history of other malignant neoplasm of stomach: Secondary | ICD-10-CM

## 2018-11-12 DIAGNOSIS — C169 Malignant neoplasm of stomach, unspecified: Secondary | ICD-10-CM

## 2018-11-12 NOTE — Telephone Encounter (Signed)
EGD scheduled, pt instructed and medications reviewed.  Patient instructions mailed to home.  Patient to call with any questions or concerns.  

## 2018-11-14 ENCOUNTER — Other Ambulatory Visit: Payer: Self-pay

## 2018-11-25 ENCOUNTER — Other Ambulatory Visit (HOSPITAL_COMMUNITY)
Admission: RE | Admit: 2018-11-25 | Discharge: 2018-11-25 | Disposition: A | Discharge status: Home / Self Care | Payer: Medicaid Other | Source: Ambulatory Visit | Attending: Gastroenterology | Admitting: Gastroenterology

## 2018-11-25 ENCOUNTER — Other Ambulatory Visit: Payer: Self-pay | Admitting: Acute Care

## 2018-11-25 DIAGNOSIS — Z01812 Encounter for preprocedural laboratory examination: Secondary | ICD-10-CM | POA: Diagnosis not present

## 2018-11-25 DIAGNOSIS — Z1159 Encounter for screening for other viral diseases: Secondary | ICD-10-CM | POA: Diagnosis not present

## 2018-11-26 LAB — NOVEL CORONAVIRUS, NAA (HOSP ORDER, SEND-OUT TO REF LAB; TAT 18-24 HRS): SARS-CoV-2, NAA: NOT DETECTED

## 2018-11-27 ENCOUNTER — Other Ambulatory Visit: Payer: Self-pay

## 2018-11-27 ENCOUNTER — Encounter (HOSPITAL_COMMUNITY): Payer: Self-pay | Admitting: *Deleted

## 2018-11-27 NOTE — Anesthesia Preprocedure Evaluation (Addendum)
Anesthesia Evaluation  Patient identified by MRN, date of birth, ID band Patient awake    Reviewed: Allergy & Precautions, NPO status , Patient's Chart, lab work & pertinent test results  History of Anesthesia Complications Negative for: history of anesthetic complications  Airway Mallampati: II  TM Distance: >3 FB Neck ROM: Full    Dental  (+) Edentulous Upper, Edentulous Lower   Pulmonary shortness of breath, COPD,  COPD inhaler, former smoker,    Pulmonary exam normal        Cardiovascular hypertension, + Peripheral Vascular Disease  Normal cardiovascular exam     Neuro/Psych negative neurological ROS     GI/Hepatic Neg liver ROS, GERD  Medicated and Controlled,Gastric ca   Endo/Other  negative endocrine ROS  Renal/GU negative Renal ROS     Musculoskeletal negative musculoskeletal ROS (+)   Abdominal   Peds  Hematology negative hematology ROS (+)   Anesthesia Other Findings Day of surgery medications reviewed with the patient.  Reproductive/Obstetrics                            Anesthesia Physical Anesthesia Plan  ASA: III  Anesthesia Plan: MAC   Post-op Pain Management:    Induction:   PONV Risk Score and Plan: Treatment may vary due to age or medical condition and Propofol infusion  Airway Management Planned: Natural Airway and Nasal Cannula  Additional Equipment:   Intra-op Plan:   Post-operative Plan:   Informed Consent: I have reviewed the patients History and Physical, chart, labs and discussed the procedure including the risks, benefits and alternatives for the proposed anesthesia with the patient or authorized representative who has indicated his/her understanding and acceptance.     Dental advisory given  Plan Discussed with: CRNA  Anesthesia Plan Comments:        Anesthesia Quick Evaluation

## 2018-11-28 ENCOUNTER — Encounter (HOSPITAL_COMMUNITY): Payer: Self-pay | Admitting: Gastroenterology

## 2018-11-28 ENCOUNTER — Encounter (HOSPITAL_COMMUNITY)
Admission: RE | Disposition: A | Discharge status: Home / Self Care | Payer: Self-pay | Source: Home / Self Care | Attending: Gastroenterology

## 2018-11-28 ENCOUNTER — Ambulatory Visit (HOSPITAL_COMMUNITY)
Admission: RE | Admit: 2018-11-28 | Discharge: 2018-11-28 | Disposition: A | Discharge status: Home / Self Care | Payer: Medicaid Other | Attending: Gastroenterology | Admitting: Gastroenterology

## 2018-11-28 ENCOUNTER — Ambulatory Visit (HOSPITAL_COMMUNITY): Payer: Medicaid Other | Admitting: Physician Assistant

## 2018-11-28 ENCOUNTER — Other Ambulatory Visit: Payer: Self-pay

## 2018-11-28 DIAGNOSIS — C169 Malignant neoplasm of stomach, unspecified: Secondary | ICD-10-CM

## 2018-11-28 DIAGNOSIS — J449 Chronic obstructive pulmonary disease, unspecified: Secondary | ICD-10-CM | POA: Diagnosis not present

## 2018-11-28 DIAGNOSIS — Z85028 Personal history of other malignant neoplasm of stomach: Secondary | ICD-10-CM | POA: Insufficient documentation

## 2018-11-28 DIAGNOSIS — Z9221 Personal history of antineoplastic chemotherapy: Secondary | ICD-10-CM | POA: Diagnosis not present

## 2018-11-28 DIAGNOSIS — R12 Heartburn: Secondary | ICD-10-CM | POA: Diagnosis not present

## 2018-11-28 DIAGNOSIS — K3189 Other diseases of stomach and duodenum: Secondary | ICD-10-CM | POA: Diagnosis not present

## 2018-11-28 DIAGNOSIS — Z87891 Personal history of nicotine dependence: Secondary | ICD-10-CM | POA: Insufficient documentation

## 2018-11-28 DIAGNOSIS — I739 Peripheral vascular disease, unspecified: Secondary | ICD-10-CM | POA: Diagnosis not present

## 2018-11-28 DIAGNOSIS — K317 Polyp of stomach and duodenum: Secondary | ICD-10-CM | POA: Diagnosis not present

## 2018-11-28 DIAGNOSIS — R1310 Dysphagia, unspecified: Secondary | ICD-10-CM | POA: Diagnosis not present

## 2018-11-28 DIAGNOSIS — I10 Essential (primary) hypertension: Secondary | ICD-10-CM | POA: Insufficient documentation

## 2018-11-28 HISTORY — PX: POLYPECTOMY: SHX5525

## 2018-11-28 HISTORY — PX: ESOPHAGOGASTRODUODENOSCOPY (EGD) WITH PROPOFOL: SHX5813

## 2018-11-28 SURGERY — ESOPHAGOGASTRODUODENOSCOPY (EGD) WITH PROPOFOL
Anesthesia: Monitor Anesthesia Care

## 2018-11-28 MED ORDER — PROPOFOL 10 MG/ML IV BOLUS
INTRAVENOUS | Status: AC
  Filled 2018-11-28: qty 60

## 2018-11-28 MED ORDER — LACTATED RINGERS IV SOLN
INTRAVENOUS | Status: DC
  Administered 2018-11-28: 08:00:00 via INTRAVENOUS

## 2018-11-28 MED ORDER — SODIUM CHLORIDE 0.9 % IV SOLN: INTRAVENOUS | Status: DC

## 2018-11-28 MED ORDER — PROPOFOL 500 MG/50ML IV EMUL
INTRAVENOUS | Status: DC | PRN
  Administered 2018-11-28: 125 ug/kg/min via INTRAVENOUS

## 2018-11-28 MED ORDER — PROPOFOL 10 MG/ML IV BOLUS
INTRAVENOUS | Status: DC | PRN
  Administered 2018-11-28 (×2): 20 mg via INTRAVENOUS

## 2018-11-28 SURGICAL SUPPLY — 14 items

## 2018-11-28 NOTE — Transfer of Care (Signed)
Immediate Anesthesia Transfer of Care Note  Patient: Bobby Caldwell  Procedure(s) Performed: ESOPHAGOGASTRODUODENOSCOPY (EGD) WITH PROPOFOL (N/A ) POLYPECTOMY  Patient Location: PACU and Endoscopy Unit  Anesthesia Type:MAC  Level of Consciousness: awake, alert  and oriented  Airway & Oxygen Therapy: Patient Spontanous Breathing and Patient connected to nasal cannula oxygen  Post-op Assessment: Report given to RN and Post -op Vital signs reviewed and stable  Post vital signs: Reviewed and stable  Last Vitals:  Vitals Value Taken Time  BP    Temp    Pulse 94 11/28/2018  9:02 AM  Resp 22 11/28/2018  9:02 AM  SpO2 100 % 11/28/2018  9:02 AM  Vitals shown include unvalidated device data.  Last Pain:  Vitals:   11/28/18 0731  TempSrc: Oral  PainSc: 0-No pain         Complications: No apparent anesthesia complications

## 2018-11-28 NOTE — Anesthesia Procedure Notes (Signed)
Procedure Name: MAC Date/Time: 11/28/2018 8:21 AM Performed by: Eben Burow, CRNA Pre-anesthesia Checklist: Patient identified, Emergency Drugs available, Suction available, Patient being monitored and Timeout performed Oxygen Delivery Method: Nasal cannula Dental Injury: Teeth and Oropharynx as per pre-operative assessment

## 2018-11-28 NOTE — Discharge Instructions (Signed)
YOU HAD AN ENDOSCOPIC PROCEDURE TODAY: Refer to the procedure report and other information in the discharge instructions given to you for any specific questions about what was found during the examination. If this information does not answer your questions, please call [place] office at [phone] to clarify.  ° °YOU SHOULD EXPECT: Some feelings of bloating in the abdomen. Passage of more gas than usual. Walking can help get rid of the air that was put into your GI tract during the procedure and reduce the bloating. If you had a lower endoscopy (such as a colonoscopy or flexible sigmoidoscopy) you may notice spotting of blood in your stool or on the toilet paper. Some abdominal soreness may be present for a day or two, also. ° °DIET: Your first meal following the procedure should be a light meal and then it is ok to progress to your normal diet. A half-sandwich or bowl of soup is an example of a good first meal. Heavy or fried foods are harder to digest and may make you feel nauseous or bloated. Drink plenty of fluids but you should avoid alcoholic beverages for 24 hours. If you had a esophageal dilation, please see attached instructions for diet.   ° °ACTIVITY: Your care partner should take you home directly after the procedure. You should plan to take it easy, moving slowly for the rest of the day. You can resume normal activity the day after the procedure however YOU SHOULD NOT DRIVE, use power tools, machinery or perform tasks that involve climbing or major physical exertion for 24 hours (because of the sedation medicines used during the test).  ° °SYMPTOMS TO REPORT IMMEDIATELY: °A gastroenterologist can be reached at any hour. Please call [phone]  for any of the following symptoms:  °Following lower endoscopy (colonoscopy, flexible sigmoidoscopy) °Excessive amounts of blood in the stool  °Significant tenderness, worsening of abdominal pains  °Swelling of the abdomen that is new, acute  °Fever of 100° or  higher  °Following upper endoscopy (EGD, EUS, ERCP, esophageal dilation) °Vomiting of blood or coffee ground material  °New, significant abdominal pain  °New, significant chest pain or pain under the shoulder blades  °Painful or persistently difficult swallowing  °New shortness of breath  °Black, tarry-looking or red, bloody stools ° °FOLLOW UP:  °If any biopsies were taken you will be contacted by phone or by letter within the next 1-3 weeks. Call [phone]  if you have not heard about the biopsies in 3 weeks.  °Please also call with any specific questions about appointments or follow up tests. ° °

## 2018-11-28 NOTE — H&P (Signed)
HPI: This is a very pleasant 63 yo man  Chief complaint is history of gastric cancer, now with adenomatous polyp in stomach  ROS: complete GI ROS as described in HPI, all other review negative.  Constitutional:  No unintentional weight loss   Past Medical History:  Diagnosis Date  . COPD (chronic obstructive pulmonary disease) ([place])   . Dyspnea   . PAD (peripheral artery disease) ([place])   . Stomach cancer ([place])   . Tobacco abuse   . Weight loss     Past Surgical History:  Procedure Laterality Date  . ABDOMINAL AORTOGRAM W/LOWER EXTREMITY N/A 05/03/2017   Procedure: ABDOMINAL AORTOGRAM W/LOWER EXTREMITY;  Surgeon: Conrad [place], MD;  Location: [facility];  Service: Cardiovascular;  Laterality: N/A;  . BIOPSY  08/22/2018   Procedure: BIOPSY;  Surgeon: [name], MD;  Location: [facility];  Service: Endoscopy;;  . ESOPHAGOGASTRODUODENOSCOPY     05/24/17  . ESOPHAGOGASTRODUODENOSCOPY (EGD) WITH PROPOFOL N/A 05/24/2017   Procedure: ESOPHAGOGASTRODUODENOSCOPY (EGD) WITH PROPOFOL;  Surgeon: [name], MD;  [facility];  Service: Endoscopy;  Laterality: N/A;  . ESOPHAGOGASTRODUODENOSCOPY (EGD) WITH PROPOFOL N/A 08/22/2018   Procedure: ESOPHAGOGASTRODUODENOSCOPY (EGD) WITH PROPOFOL;  Surgeon: [name], MD;  [facility];  Service: Endoscopy;  Laterality: N/A;  . EUS N/A 06/07/2017   Procedure: UPPER ENDOSCOPIC ULTRASOUND (EUS) RADIAL;  Surgeon: [name], MD;  [facility];  Service: Endoscopy;  Laterality: N/A;  . FEMORAL-FEMORAL BYPASS GRAFT Bilateral 05/08/2017   Procedure: LEFT TO RIGHT BYPASS GRAFT FEMORAL-FEMORAL ARTERY;  Surgeon: Conrad [place], MD;  Location: [facility];  Service: Vascular;  Laterality: Bilateral;  . IR FLUORO GUIDE PORT INSERTION RIGHT  07/05/2017  . IR US GUIDE VASC ACCESS RIGHT  07/05/2017  . LAPAROSCOPIC INSERTION GASTROSTOMY TUBE N/A 11/27/2017   Procedure: JEJUNOSTOMY TUBE PLACEMENT;  Surgeon:  [name], MD;  Location: [facility];  Service: General;  Laterality: N/A;  . LAPAROSCOPIC PARTIAL GASTRECTOMY N/A 11/27/2017   Procedure: PARTIAL GASTRECTOMY;  Surgeon: [name], MD;  Location: [facility];  Service: General;  Laterality: N/A;  . LAPAROSCOPY N/A 11/27/2017   Procedure: LAPAROSCOPY DIAGNOSTIC;  Surgeon: [name], MD;  Location: [facility];  Service: General;  Laterality: N/A;  . NM MYOVIEW LTD  06/2017   LOW RISK. Small basal inferoseptal defect thought to be related to artifact (although cannot exclude prior infarct). Normal wall motion i in this area would suggest artifact not infarct. Otherwise no ischemia or infarction.  Marland Kitchen PERIPHERAL VASCULAR INTERVENTION Left 05/03/2017   Procedure: PERIPHERAL VASCULAR INTERVENTION;  Surgeon: Conrad [place], MD;  Location: [place] [facility];  Service: Cardiovascular;  Laterality: Left;  common iliac  . TRANSTHORACIC ECHOCARDIOGRAM  06/26/2017   Normal LV size and function. EF 60-65%. No wall motion abnormalities. GR 1 DD. Mild LA dilation    Current Facility-Administered Medications  Medication Dose Route Frequency Provider Last Rate Last Dose  . 0.9 %  sodium chloride infusion   Intravenous Continuous [name], MD        Allergies as of 11/11/2018  . (No Known Allergies)    Family History  Family history unknown: Yes    Social History   Socioeconomic History  . Marital status: Married    Spouse name: Not on file  . Number of children: 4  . Years of education: Not on file  . Highest education level: Not on file  Occupational History  . Not on file  Social Needs  . Financial resource strain: Not on file  . Food insecurity:    Worry: Not on file    Inability: Not on file  . Transportation needs:    Medical: Not on file    Non-medical: Not on file  Tobacco Use  . Smoking status: Former Smoker    Packs/day: 0.50    Years: 50.00    Pack years: 25.00    Types: Cigarettes    Last attempt to quit: 11/2017    Years  since quitting: 1.0  . Smokeless tobacco: Never Used  . Tobacco comment: 3 cig per day  Substance and Sexual Activity  . Alcohol use: No    Alcohol/week: 0.0 standard drinks  . Drug use: No  . Sexual activity: Yes  Lifestyle  . Physical activity:    Days per week: Not on file    Minutes per session: Not on file  . Stress: Not on file  Relationships  . Social connections:    Talks on phone: Not on file    Gets together: Not on file    Attends religious service: Not on file    Active member of club or organization: Not on file    Attends meetings of clubs or organizations: Not on file    Relationship status: Not on file  . Intimate partner violence:    Fear of current or ex partner: Not on file    Emotionally abused: Not on file    Physically abused: Not on file    Forced sexual activity: Not on file  Other Topics Concern  . Not on file  Social History Narrative  . Not on file     Physical Exam: There were no vitals taken for this visit. Constitutional: generally well-appearing Psychiatric: alert and oriented x3 Abdomen: soft, nontender, nondistended, no obvious ascites, no peritoneal signs, normal bowel sounds No peripheral edema noted in lower extremities  Assessment and plan: 63 y.o. male with history of gastric cancer, now with adenomatous polyp in his stomach  For EGD, polypectomy today.  Please see the "Patient Instructions" section for addition details about the plan.  Owens Loffler, MD [facility] 11/28/2018, 7:30 AM

## 2018-11-28 NOTE — Op Note (Signed)
[facility] Patient Name: Bobby Caldwell Procedure Date: 11/28/2018 MRN: [id number] Attending MD: [name] , MD Date of Birth: [birth year] CSN: [id number] Age: 63 Admit Type: Outpatient Procedure:                Upper GI endoscopy Indications:              gastric polyp; Distal gastrectomy following                            neoadjuvant chemo 11/2017 for Stage I gastric                            adenocarcinoma; 07/2017 CT chest/abd/pelvis shows no                            sign of recurrence; EGD 08/2018 found adenomatous                            gastric polyp at the gastr-enteric anastomosis,                            here for removal now. Providers:                [name], MD, Cleda Daub, RN, Marguerita Merles, Technician, Cherylynn Ridges, Technician Referring MD:              Medicines:                Monitored Anesthesia Care Complications:            No immediate complications. Estimated blood loss:                            None. Estimated Blood Loss:     Estimated blood loss: none. Procedure:                Pre-Anesthesia Assessment:                           - Prior to the procedure, a History and Physical                            was performed, and patient medications and                            allergies were reviewed. The patient's tolerance of                            previous anesthesia was also reviewed. The risks                            and benefits of the procedure and the sedation                            options and risks  were discussed with the patient.                            All questions were answered, and informed consent                            was obtained. Prior Anticoagulants: The patient has                            taken no previous anticoagulant or antiplatelet                            agents. ASA Grade Assessment: IV - A patient with                            severe  systemic disease that is a constant threat                            to life. After reviewing the risks and benefits,                            the patient was deemed in satisfactory condition to                            undergo the procedure.                           After obtaining informed consent, the endoscope was                            passed under direct vision. Throughout the                            procedure, the patient's blood pressure, pulse, and                            oxygen saturations were monitored continuously. The                            GIF-H190 ([id number]) Olympus gastroscope was                            introduced through the mouth, and advanced to the                            jejunum. The upper GI endoscopy was accomplished                            without difficulty. The patient tolerated the                            procedure well. Scope In: Scope Out: Findings:      Normal esophagus.      Bilroth II anatomy. The previously noted adenomatous polyp was  again       located at the [place] anastomosis. It was soft, sessile, about 1.9cm across       in greatest dimension. I used mixed cold/cautery piecemeal snare       resection to (apparently) completely remove the polyp.      The afferent and efferent Bilroth II limbs were intubated briefly and       appeared normal.      The exam was otherwise without abnormality. Impression:               - Bilroth II anatomy. The previously noted                            adenomatous polyp was again located at the [place]                            anastomosis. It was soft, sessile, about 1.9cm                            across in greatest dimension. I used mixed                            cold/cautery snare resection to (apparently)                            completely remove the polyp. Moderate Sedation:      Not Applicable - Patient had care per Anesthesia. Recommendation:           - Patient has a contact  number available for                            emergencies. The signs and symptoms of potential                            delayed complications were discussed with the                            patient. Return to normal activities tomorrow.                            Written discharge instructions were provided to the                            patient.                           - Resume previous diet.                           - Continue present medications.                           - Await pathology results. Likely will recommend                            repeat EGD in 6 months. Procedure Code(s):        ---  Professional ---                           [phone], Esophagogastroduodenoscopy, flexible,                            transoral; with removal of tumor(s), polyp(s), or                            other lesion(s) by snare technique Diagnosis Code(s):        --- Professional ---                           K31.7, Polyp of stomach and duodenum                           R12, Heartburn CPT copyright 2019 American Medical Association. All rights reserved. The codes documented in this report are preliminary and upon coder review may  be revised to meet current compliance requirements. [name], MD 11/28/2018 9:01:48 AM This report has been signed electronically. Number of Addenda: 0

## 2018-11-28 NOTE — Anesthesia Postprocedure Evaluation (Signed)
Anesthesia Post Note  Patient: Lucille Crichlow  Procedure(s) Performed: ESOPHAGOGASTRODUODENOSCOPY (EGD) WITH PROPOFOL (N/A ) POLYPECTOMY     Patient location during evaluation: PACU Anesthesia Type: MAC Level of consciousness: awake and alert Pain management: pain level controlled Vital Signs Assessment: post-procedure vital signs reviewed and stable Respiratory status: spontaneous breathing, nonlabored ventilation and respiratory function stable Cardiovascular status: blood pressure returned to baseline and stable Postop Assessment: no apparent nausea or vomiting Anesthetic complications: no    Last Vitals:  Vitals:   11/28/18 0731 11/28/18 0902  BP: (!) 137/92 127/67  Pulse: 98 94  Resp: 18 (!) 22  Temp: 36.9 C 36.7 C  SpO2: 97% 100%    Last Pain:  Vitals:   11/28/18 0902  TempSrc: Oral  PainSc: 0-No pain                 Brennan Bailey

## 2018-11-29 ENCOUNTER — Encounter (HOSPITAL_COMMUNITY): Payer: Self-pay | Admitting: Gastroenterology

## 2018-12-04 NOTE — Progress Notes (Signed)
[facility]   Telephone:(336) [phone] Fax:(336) [phone]   Clinic Follow up Note   Patient Care Team: Patient, No Pcp Per as PCP - General (General Practice) [name], MD as Consulting Physician (Pulmonary Disease) [name], MD as Attending Physician (Gastroenterology) [name], MD as Consulting Physician (General Surgery)  Date of Service:  12/06/2018  CHIEF COMPLAINT: F/u on gastric adenocarcinoma   SUMMARY OF ONCOLOGIC HISTORY: Oncology History   Cancer Staging Gastric cancer [facility]) Staging form: Stomach, AJCC 8th Edition - Clinical stage from 05/24/2017: Stage I (cT2, cN0, cM0) - Signed by [name], MD on 06/19/2017 - Pathologic stage from 11/27/2017: Stage I (ypT1b, pN0, cM0) - Signed by [name], MD on 04/04/2018       Gastric adenocarcinoma ([place])   05/24/2017 Procedure    EGD by Dr. [name] 05/24/17 IMPRESSION Ulcerated mass like region in the distal stomach (4cm across), extensively biopsied. This appears neoplastic. - Abnormal mucosa throughout stomach otherwise (mild-moderate gastritis), also extensively biopsied.    05/24/2017 Initial Biopsy    Diagnosis 05/24/17  1. Stomach, biopsy, mass distal - ADENOCARCINOMA, SEE COMMENT. - CHRONIC ACTIVE GASTRITIS WITH INTESTINAL METAPLASIA. 2. 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 - NO DYSPLASIA OR MALIGNANCY    06/04/2017 Initial Diagnosis    Gastric cancer ([place])    06/07/2017 Procedure    EUS by Dr. [name] on 06/07/17  IMPRESSION:  5cm across, partially circumferential uT2N0 gastric adenocarcinoma laying in the distal stomach 3-4cm proximal to the pylorus.    06/22/2017 Imaging    CT CAP W Contrast 06/22/17  IMPRESSION: 1. The gastric mass is somewhat inconspicuous on CT, but a potential 1.2 cm thick mucosal masses seen along the posterior gastric border on image 83/2. There several small  adjacent right gastric lymph nodes measuring up to 0.7 cm in short axis, but no overtly pathologic adenopathy and no findings of hepatic or peritoneal metastatic disease at this time. 2. Calcified granulomas in both lungs along with several noncalcified nodules stable from August 2016 and likely benign given this long-term stability. 3. Other imaging findings of potential clinical significance: Aortic Atherosclerosis (ICD10-I70.0) and Emphysema (ICD10-J43.9). Airway thickening is present, suggesting bronchitis or reactive airways disease. 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 Disc bulges at L3-4 and L4-5.    07/05/2017 Surgery    PAC placement     07/07/2017 - 09/06/2017 Chemotherapy    Neoadjuvant FOLFOX with with leucovorin every 2 weeks. Plan for 6 cycles but stopped after 4 cycles due to poor tolerance      09/10/2017 Imaging     CT AP W Contrast 09/10/17 IMPRESSION: 1. No acute findings identified within the abdomen or pelvis. 2. Interval development of multiple bilateral pulmonary densities within both lower lobes. These are indeterminate, likely post infectious or inflammatory in etiology. Atypical infection not excluded. Metastatic disease is considered less favored but not entirely excluded and follow-up imaging to ensure resolution is advised. 3. No evidence to suggest recurrent tumor or metastatic disease within the abdomen or pelvis. 4.  Aortic Atherosclerosis (ICD10-I70.0).    11/27/2017 Surgery     Surgery He had partial gastrectomy with Dr. [name] on 11/27/2017.    11/27/2017 Pathology Results    11/27/2017 Surgical Pathology Diagnosis 1. Stomach, resection for tumor, Distal w/ Omentum - ADENOCARCINOMA, MODERATELY DIFFERENTIATED, 3.8 CM - LYMPHOVASCULAR SPACE AND PERINEURAL INVASION - NO CARCINOMA  IDENTIFIED IN FOUR LYMPH NODES (0/4) - SEE ONCOLOGY TABLE AND COMMENT BELOW 2. Lymph  node, biopsy, Portal - NO CARCINOMA IDENTIFIED IN ONE LYMPH NODE (0/1) - SEE COMMENT 3. Lymph node, biopsy, Left Gastric - NO CARCINOMA IDENTIFIED IN TWO LYMPH NODES (0/2) - SEE COMMENT 4. Omentum, resection for tumor - BENIGN FIBROADIPOSE TISSUE - NO CARCINOMA IDENTIFIED     11/27/2017 Cancer Staging    Staging form: Stomach, AJCC 8th Edition - Pathologic stage from 11/27/2017: Stage I (ypT1b, pN0, cM0) - Signed by [name], MD on 04/04/2018    08/14/2018 Imaging    CT CAP 08/14/18 IMPRESSION:  1. No findings of active malignancy. 2. Generally stable pulmonary nodules are identified in the lungs. The only new nodule seen today is a 3 mm right apical nodule on image 32/4 which may merit surveillance. Many of the basilar nodules shown on the prior CT abdomen from 09/10/2017 have resolved. 3. Other imaging findings of potential clinical significance: Aortic Atherosclerosis (ICD10-I70.0). Emphysema (ICD10-J43.9). Lumbar degenerative disc disease.    08/22/2018 Procedure    Upper endoscopy 08/22/18 by Dr Verdie Drown The esophagus was slightly tortuous but there were no strictures/stenosis and the mucosa was normal throughout. The gastro-jejunostomy site was widely patent, Bilroth II anatomy. There was a 1.5-2cm region of the anastomosis that was discretely abnormal, soft but somewhat neoplastic appearing and nodular. I biopsied this region extensively. Diagnosis Stomach, biopsy, GJ anastomosis erythema - LOW GRADE GLANDULAR DYSPLASIA. - GOBLET CELL METAPLASIA. - SEE COMMENT.    11/28/2018 Procedure    Upper Endoscopy 11/28/18 by Dr Verdie Drown - Bilroth II anatomy. The previously noted adenomatous polyp was again located at the [place] anastomosis. It was soft, sessile, about 1.9cm across in greatest dimension. I used mixed cold/cautery snare resection to (apparently) completely remove the polyp. Diagnosis Stomach, polyp(s) - LOW GRADE GLANDULAR DYSPLASIA. - GOBLET CELL  METAPLASIA.      CURRENT THERAPY:  Surveillance  INTERVAL HISTORY:  Bobby Caldwell is here for a follow up of gastric adenocarcinoma. He presents to the clinic alone. He feels her recovered well from surgery. His latest endoscopy by Dr [name] on 11/28/18. He denies abdominal. issues. He notes trouble breathing. He notes low appetite and he lost 10 pounds. He sleeps about 4 hours at a time. For breathing he uses nebulizer He uses 5 in 24 hours. He has not needed to use rescue inhaler. He has not quit smoking. He is currently at 6-8 cigarettes a day. I reviewed his medication list with then.    REVIEW OF SYSTEMS:   Constitutional: Denies fevers, chills (+) Low appetite, 10 pound weight loss  Eyes: Denies blurriness of vision Ears, nose, mouth, throat, and face: Denies mucositis or sore throat Respiratory: Denies cough wheezes (+)  Dyspnea  Cardiovascular: Denies palpitation, chest discomfort or lower extremity swelling Gastrointestinal:  Denies nausea, heartburn or change in bowel habits Skin: Denies abnormal skin rashes Lymphatics: Denies new lymphadenopathy or easy bruising Neurological:Denies numbness, tingling or new weaknesses Behavioral/Psych: Mood is stable, no new changes  All other systems were reviewed with the patient and are negative.  MEDICAL HISTORY:  Past Medical History:  Diagnosis Date  . COPD (chronic obstructive pulmonary disease) ([place])   . Dyspnea   . PAD (peripheral artery disease) ([place])   . Stomach cancer ([place])   . Tobacco abuse   . Weight loss     SURGICAL HISTORY: Past Surgical History:  Procedure Laterality Date  . ABDOMINAL AORTOGRAM W/LOWER  EXTREMITY N/A 05/03/2017   Procedure: ABDOMINAL AORTOGRAM W/LOWER EXTREMITY;  Surgeon: Conrad Kreamer, MD;  Location: [place] [facility];  Service: Cardiovascular;  Laterality: N/A;  . BIOPSY  08/22/2018   Procedure: BIOPSY;  Surgeon: [name], MD;  Location: [facility];  Service: Endoscopy;;  .  ESOPHAGOGASTRODUODENOSCOPY     05/24/17  . ESOPHAGOGASTRODUODENOSCOPY (EGD) WITH PROPOFOL N/A 05/24/2017   Procedure: ESOPHAGOGASTRODUODENOSCOPY (EGD) WITH PROPOFOL;  Surgeon: [name], MD;  Location: [facility];  Service: Endoscopy;  Laterality: N/A;  . ESOPHAGOGASTRODUODENOSCOPY (EGD) WITH PROPOFOL N/A 08/22/2018   Procedure: ESOPHAGOGASTRODUODENOSCOPY (EGD) WITH PROPOFOL;  Surgeon: [name], MD;  Location: [facility];  Service: Endoscopy;  Laterality: N/A;  . ESOPHAGOGASTRODUODENOSCOPY (EGD) WITH PROPOFOL N/A 11/28/2018   Procedure: ESOPHAGOGASTRODUODENOSCOPY (EGD) WITH PROPOFOL;  Surgeon: [name], MD;  Location: [facility];  Service: Endoscopy;  Laterality: N/A;  . EUS N/A 06/07/2017   Procedure: UPPER ENDOSCOPIC ULTRASOUND (EUS) RADIAL;  Surgeon: [name], MD;  Location: [facility];  Service: Endoscopy;  Laterality: N/A;  . FEMORAL-FEMORAL BYPASS GRAFT Bilateral 05/08/2017   Procedure: LEFT TO RIGHT BYPASS GRAFT FEMORAL-FEMORAL ARTERY;  Surgeon: Conrad Flora, MD;  Location: [place];  Service: Vascular;  Laterality: Bilateral;  . IR FLUORO GUIDE PORT INSERTION RIGHT  07/05/2017  . IR US GUIDE VASC ACCESS RIGHT  07/05/2017  . LAPAROSCOPIC INSERTION GASTROSTOMY TUBE N/A 11/27/2017   Procedure: JEJUNOSTOMY TUBE PLACEMENT;  Surgeon: [name], MD;  Location: [place];  Service: General;  Laterality: N/A;  . LAPAROSCOPIC PARTIAL GASTRECTOMY N/A 11/27/2017   Procedure: PARTIAL GASTRECTOMY;  Surgeon: [name], MD;  Location: [place];  Service: General;  Laterality: N/A;  . LAPAROSCOPY N/A 11/27/2017   Procedure: LAPAROSCOPY DIAGNOSTIC;  Surgeon: [name], MD;  Location: [place];  Service: General;  Laterality: N/A;  . NM MYOVIEW LTD  06/2017   LOW RISK. Small basal inferoseptal defect thought to be related to artifact (although cannot exclude prior infarct). Normal wall motion i in this area would suggest artifact not infarct. Otherwise no ischemia or  infarction.  Marland Kitchen PERIPHERAL VASCULAR INTERVENTION Left 05/03/2017   Procedure: PERIPHERAL VASCULAR INTERVENTION;  Surgeon: Conrad , MD;  Location: [place] [facility];  Service: Cardiovascular;  Laterality: Left;  common iliac  . POLYPECTOMY  11/28/2018   Procedure: POLYPECTOMY;  Surgeon: [name], MD;  Location: [facility];  Service: Endoscopy;;  . TRANSTHORACIC ECHOCARDIOGRAM  06/26/2017   Normal LV size and function. EF 60-65%. No wall motion abnormalities. GR 1 DD. Mild LA dilation    I have reviewed the social history and family history with the patient and they are unchanged from previous note.  ALLERGIES:  has No Known Allergies.  MEDICATIONS:  Current Outpatient Medications  Medication Sig Dispense Refill  . budesonide-formoterol (SYMBICORT) 160-4.5 MCG/ACT inhaler Inhale 2 puffs into the lungs 2 (two) times daily. 1 Inhaler 6  . buPROPion (ZYBAN) 150 MG 12 hr tablet Take 150 mg by mouth daily.    . chlorhexidine (PERIDEX) 0.12 % solution Use as directed 15 mLs in the mouth or throat 2 (two) times daily.    Marland Kitchen ipratropium-albuterol (DUONEB) 0.5-2.5 (3) MG/3ML SOLN USE 1 AMPULE IN NEBULIZER 4 TIMES DAILY (Patient taking differently: Inhale 3 mLs into the lungs 4 (four) times daily. ) 360 mL 2  . omeprazole (PRILOSEC) 40 MG capsule Take 1 capsule (40 mg total) by mouth daily. 30 capsule 11  . Tiotropium Bromide Monohydrate (SPIRIVA RESPIMAT) 2.5  MCG/ACT AERS Inhale 2 puffs into the lungs daily.    . mirtazapine (REMERON) 7.5 MG tablet Take 1 tablet (7.5 mg total) by mouth at bedtime. 30 tablet 2  . nystatin (MYCOSTATIN) 100000 UNIT/ML suspension Take 5 mLs (500,000 Units total) by mouth 4 (four) times daily. Rinse mouth for 2 minutes and spit 473 mL 0   No current facility-administered medications for this visit.     PHYSICAL EXAMINATION: ECOG PERFORMANCE STATUS: 2 - Symptomatic, <50% confined to bed  Vitals:   12/06/18 1250  BP: 130/84  Pulse: 100  Resp: 18   Temp: 98.9 F (37.2 C)  SpO2: 96%   Filed Weights   12/06/18 1250  Weight: 114 lb 4.8 oz (51.8 kg)    GENERAL:alert, no distress and comfortable SKIN: skin color, texture, turgor are normal, no rashes or significant lesions EYES: normal, conjunctiva are pink and non-injected, sclera clear OROPHARYNX:no exudate, no erythema and lips, buccal mucosa (+) Oral thrush  NECK: supple, thyroid normal size, non-tender, without nodularity LYMPH:  no palpable lymphadenopathy in the cervical, axillary   LUNGS: clear to auscultation and percussion with normal breathing effort HEART: regular rate & rhythm and no murmurs and no lower extremity edema ABDOMEN:abdomen soft, non-tender and normal bowel sounds (+) surgical incision healed well  Musculoskeletal:no cyanosis of digits and no clubbing  PSYCH: alert & oriented x 3 with fluent speech NEURO: no focal motor/sensory deficits   LABORATORY DATA:  I have reviewed the data as listed CBC Latest Ref Rng & Units 12/06/2018 07/18/2018 04/04/2018  WBC 4.0 - 10.5 K/uL 8.3 7.4 8.2  Hemoglobin 13.0 - 17.0 g/dL 14.6 14.5 15.2  Hematocrit 39.0 - 52.0 % 44.7 43.1 45.1  Platelets 150 - 400 K/uL 249 241 239     CMP Latest Ref Rng & Units 12/06/2018 07/18/2018 04/04/2018  Glucose 70 - 99 mg/dL 85 99 87  BUN 8 - 23 mg/dL 12 8 7(L)  Creatinine 0.61 - 1.24 mg/dL 0.82 0.72 0.74  Sodium 135 - 145 mmol/L 142 141 141  Potassium 3.5 - 5.1 mmol/L 3.4(L) 3.3(L) 3.6  Chloride 98 - 111 mmol/L 106 106 105  CO2 22 - 32 mmol/L 27 25 27   Calcium 8.9 - 10.3 mg/dL 9.0 8.9 9.5  Total Protein 6.5 - 8.1 g/dL 6.7 6.6 7.1  Total Bilirubin 0.3 - 1.2 mg/dL 0.3 0.3 0.4  Alkaline Phos 38 - 126 U/L 100 136(H) 102  AST 15 - 41 U/L 10(L) 16 14(L)  ALT 0 - 44 U/L 10 16 20       RADIOGRAPHIC STUDIES: I have personally reviewed the radiological images as listed and agreed with the findings in the report. No results found.   ASSESSMENT & PLAN:  Borden Thune is a 63 y.o. male with    1. Gastric Adenocarcinoma, uT2N0M0, stage I, ypT1bN0 -Diagnosed in 05/2017. Treated with neoadjuvant chemo and surgery. Currently on surveillance. -Surveillance CT CAP from 08/14/18 shows no concern for cancer recurrence, stable small lung nodules like benign due to smoking history, no other concerns.  -I discussed his latest endoscopy by Dr Nicholaus Corolla on 11/28/18 revealed benign surgical changes, biopsy was negative for malignant cells  -Labs reviewed, CBC and CMP WNL except K 3.4, AST 10. I encouraged him to increase potassium in his diet. He has oral thrush on exam, I called in Nystatin mouth wash. Exam otherwise unremarkable.  -He is due to PAC flush. Continue every 6-8 weeks -Continue surveillance Plan to repeat CT scan before next visit  -  F/u in 2.5 months    2. COPD, Smoking Cessation -I previously advised him to quit smoking. -He smoked 6 cigarettes a day.  I again strongly encouraged him to quit smoking completely -he is on bupropion which helps  -f/u with pulmonary clinic  3.Depression and or anxiety -Currently on Ativan as needed. Continue and monitor.  4. Dysphagia, low appetite and weight loss -Repeat endoscopies in February a and May 2020 show benign polyp at [facility] anastamosis.  -he previously tried Megace  -I called in mirtazapine today    Plan  Port flush next week F/u in 10 weeks with lab, flush and CT CAP with contrast one week before, please mail pt a calendar with appointments   I called in Nystatin mouthwash and mirtazapine for him today     No problem-specific Assessment & Plan notes found for this encounter.   Orders Placed This Encounter  Procedures  . CT Abdomen Pelvis W Contrast    Standing Status:   Future    Standing Expiration Date:   12/06/2019    Order Specific Question:   If indicated for the ordered procedure, I authorize the administration of contrast media per Radiology protocol    Answer:   Yes    Order Specific Question:   Preferred imaging  location?    Answer:   [facility]    Order Specific Question:   Is Oral Contrast requested for this exam?    Answer:   Yes, Per Radiology protocol    Order Specific Question:   Radiology Contrast Protocol - do NOT remove file path    Answer:   \\[email or web address]  . CT Chest W Contrast    Standing Status:   Future    Standing Expiration Date:   12/06/2019    Order Specific Question:   If indicated for the ordered procedure, I authorize the administration of contrast media per Radiology protocol    Answer:   Yes    Order Specific Question:   Preferred imaging location?    Answer:   [facility]    Order Specific Question:   Radiology Contrast Protocol - do NOT remove file path    Answer:   \\[email or web address]   All questions were answered. The patient knows to call the clinic with any problems, questions or concerns. No barriers to learning was detected. I spent 20 minutes counseling the patient face to face. The total time spent in the appointment was 25 minutes and more than 50% was on counseling and review of test results     [name], MD 12/06/2018   I, Joslyn Devon, am acting as scribe for [name], MD.   I have reviewed the above documentation for accuracy and completeness, and I agree with the above.

## 2018-12-06 ENCOUNTER — Inpatient Hospital Stay: Payer: Medicaid Other | Attending: Hematology | Admitting: Hematology

## 2018-12-06 ENCOUNTER — Encounter: Payer: Self-pay | Admitting: Hematology

## 2018-12-06 ENCOUNTER — Other Ambulatory Visit: Payer: Self-pay

## 2018-12-06 ENCOUNTER — Inpatient Hospital Stay: Payer: Medicaid Other

## 2018-12-06 VITALS — BP 130/84 | HR 100 | Temp 98.9°F | Resp 18 | Ht 65.0 in | Wt 114.3 lb

## 2018-12-06 DIAGNOSIS — R1310 Dysphagia, unspecified: Secondary | ICD-10-CM | POA: Diagnosis not present

## 2018-12-06 DIAGNOSIS — I700 Atherosclerosis of aorta: Secondary | ICD-10-CM | POA: Diagnosis not present

## 2018-12-06 DIAGNOSIS — J439 Emphysema, unspecified: Secondary | ICD-10-CM | POA: Diagnosis not present

## 2018-12-06 DIAGNOSIS — C164 Malignant neoplasm of pylorus: Secondary | ICD-10-CM | POA: Diagnosis not present

## 2018-12-06 DIAGNOSIS — Z9221 Personal history of antineoplastic chemotherapy: Secondary | ICD-10-CM | POA: Diagnosis not present

## 2018-12-06 DIAGNOSIS — F17210 Nicotine dependence, cigarettes, uncomplicated: Secondary | ICD-10-CM | POA: Diagnosis not present

## 2018-12-06 DIAGNOSIS — Z7951 Long term (current) use of inhaled steroids: Secondary | ICD-10-CM | POA: Diagnosis not present

## 2018-12-06 DIAGNOSIS — F419 Anxiety disorder, unspecified: Secondary | ICD-10-CM

## 2018-12-06 DIAGNOSIS — R634 Abnormal weight loss: Secondary | ICD-10-CM | POA: Insufficient documentation

## 2018-12-06 DIAGNOSIS — B370 Candidal stomatitis: Secondary | ICD-10-CM | POA: Insufficient documentation

## 2018-12-06 DIAGNOSIS — I739 Peripheral vascular disease, unspecified: Secondary | ICD-10-CM | POA: Diagnosis not present

## 2018-12-06 DIAGNOSIS — C169 Malignant neoplasm of stomach, unspecified: Secondary | ICD-10-CM

## 2018-12-06 DIAGNOSIS — C162 Malignant neoplasm of body of stomach: Secondary | ICD-10-CM

## 2018-12-06 DIAGNOSIS — Z79899 Other long term (current) drug therapy: Secondary | ICD-10-CM

## 2018-12-06 DIAGNOSIS — M5136 Other intervertebral disc degeneration, lumbar region: Secondary | ICD-10-CM

## 2018-12-06 DIAGNOSIS — J432 Centrilobular emphysema: Secondary | ICD-10-CM

## 2018-12-06 DIAGNOSIS — F329 Major depressive disorder, single episode, unspecified: Secondary | ICD-10-CM | POA: Diagnosis not present

## 2018-12-06 LAB — CBC WITH DIFFERENTIAL/PLATELET
Abs Immature Granulocytes: 0.03 10*3/uL (ref 0.00–0.07)
Basophils Absolute: 0.1 10*3/uL (ref 0.0–0.1)
Basophils Relative: 1 %
Eosinophils Absolute: 0.1 10*3/uL (ref 0.0–0.5)
Eosinophils Relative: 2 %
HCT: 44.7 % (ref 39.0–52.0)
Hemoglobin: 14.6 g/dL (ref 13.0–17.0)
Immature Granulocytes: 0 %
Lymphocytes Relative: 31 %
Lymphs Abs: 2.6 10*3/uL (ref 0.7–4.0)
MCH: 29.1 pg (ref 26.0–34.0)
MCHC: 32.7 g/dL (ref 30.0–36.0)
MCV: 89.2 fL (ref 80.0–100.0)
Monocytes Absolute: 0.7 10*3/uL (ref 0.1–1.0)
Monocytes Relative: 9 %
Neutro Abs: 4.8 10*3/uL (ref 1.7–7.7)
Neutrophils Relative %: 57 %
Platelets: 249 10*3/uL (ref 150–400)
RBC: 5.01 MIL/uL (ref 4.22–5.81)
RDW: 13.4 % (ref 11.5–15.5)
WBC: 8.3 10*3/uL (ref 4.0–10.5)
nRBC: 0 % (ref 0.0–0.2)

## 2018-12-06 LAB — CMP (CANCER CENTER ONLY)
ALT: 10 U/L (ref 0–44)
AST: 10 U/L — ABNORMAL LOW (ref 15–41)
Albumin: 3.5 g/dL (ref 3.5–5.0)
Alkaline Phosphatase: 100 U/L (ref 38–126)
Anion gap: 9 (ref 5–15)
BUN: 12 mg/dL (ref 8–23)
CO2: 27 mmol/L (ref 22–32)
Calcium: 9 mg/dL (ref 8.9–10.3)
Chloride: 106 mmol/L (ref 98–111)
Creatinine: 0.82 mg/dL (ref 0.61–1.24)
GFR, Est AFR Am: >60 mL/min (ref >60)
GFR, Estimated: >60 mL/min (ref >60)
Glucose, Bld: 85 mg/dL (ref 70–99)
Potassium: 3.4 mmol/L — ABNORMAL LOW (ref 3.5–5.1)
Sodium: 142 mmol/L (ref 135–145)
Total Bilirubin: 0.3 mg/dL (ref 0.3–1.2)
Total Protein: 6.7 g/dL (ref 6.5–8.1)

## 2018-12-06 MED ORDER — MIRTAZAPINE 7.5 MG PO TABS: 7.5000 mg | ORAL_TABLET | Freq: Every day | ORAL | 2 refills | Status: DC

## 2018-12-06 MED ORDER — NYSTATIN 100000 UNIT/ML MT SUSP: 5.0000 mL | Freq: Four times a day (QID) | OROMUCOSAL | 0 refills | Status: DC

## 2018-12-09 ENCOUNTER — Telehealth: Payer: Self-pay | Admitting: Hematology

## 2018-12-09 NOTE — Telephone Encounter (Signed)
Scheduled appt per 5/29 los.  Patient aware of appt date n time.  A calendar will be mailed out.

## 2018-12-13 ENCOUNTER — Telehealth: Payer: Self-pay

## 2018-12-13 ENCOUNTER — Inpatient Hospital Stay: Payer: Medicaid Other | Attending: Hematology

## 2018-12-13 ENCOUNTER — Other Ambulatory Visit: Payer: Self-pay

## 2018-12-13 DIAGNOSIS — Z452 Encounter for adjustment and management of vascular access device: Secondary | ICD-10-CM | POA: Insufficient documentation

## 2018-12-13 DIAGNOSIS — J449 Chronic obstructive pulmonary disease, unspecified: Secondary | ICD-10-CM | POA: Insufficient documentation

## 2018-12-13 DIAGNOSIS — Z9221 Personal history of antineoplastic chemotherapy: Secondary | ICD-10-CM | POA: Insufficient documentation

## 2018-12-13 DIAGNOSIS — R1310 Dysphagia, unspecified: Secondary | ICD-10-CM | POA: Insufficient documentation

## 2018-12-13 DIAGNOSIS — F17210 Nicotine dependence, cigarettes, uncomplicated: Secondary | ICD-10-CM | POA: Insufficient documentation

## 2018-12-13 DIAGNOSIS — Z79899 Other long term (current) drug therapy: Secondary | ICD-10-CM | POA: Insufficient documentation

## 2018-12-13 DIAGNOSIS — F418 Other specified anxiety disorders: Secondary | ICD-10-CM | POA: Diagnosis not present

## 2018-12-13 DIAGNOSIS — R0600 Dyspnea, unspecified: Secondary | ICD-10-CM | POA: Diagnosis not present

## 2018-12-13 DIAGNOSIS — C164 Malignant neoplasm of pylorus: Secondary | ICD-10-CM | POA: Insufficient documentation

## 2018-12-13 DIAGNOSIS — Z95828 Presence of other vascular implants and grafts: Secondary | ICD-10-CM

## 2018-12-13 DIAGNOSIS — R630 Anorexia: Secondary | ICD-10-CM | POA: Diagnosis not present

## 2018-12-13 DIAGNOSIS — C169 Malignant neoplasm of stomach, unspecified: Secondary | ICD-10-CM

## 2018-12-13 MED ORDER — SODIUM CHLORIDE 0.9% FLUSH
10.0000 mL | Freq: Once | INTRAVENOUS | Status: AC
  Administered 2018-12-13: 10 mL
  Filled 2018-12-13: qty 10

## 2018-12-13 MED ORDER — HEPARIN SOD (PORK) LOCK FLUSH 100 UNIT/ML IV SOLN
500.0000 [IU] | Freq: Once | INTRAVENOUS | Status: AC
  Administered 2018-12-13: 500 [IU]
  Filled 2018-12-13: qty 5

## 2018-12-13 NOTE — Telephone Encounter (Signed)
Patient's daughter calls stating he wants to finish getting his dental work done however needs Dr. Ernestina Penna clearance.  [phone]

## 2019-01-30 ENCOUNTER — Telehealth: Payer: Self-pay | Admitting: Hematology

## 2019-01-30 NOTE — Telephone Encounter (Signed)
Scheduled appt per 7/23 sch message -  unable to reach pt . Left message with appt date and time on daughters phone

## 2019-01-31 ENCOUNTER — Ambulatory Visit (HOSPITAL_COMMUNITY): Admission: RE | Admit: 2019-01-31 | Payer: Medicaid Other | Source: Ambulatory Visit

## 2019-01-31 ENCOUNTER — Telehealth: Payer: Self-pay

## 2019-01-31 ENCOUNTER — Inpatient Hospital Stay: Payer: Medicaid Other | Attending: Hematology

## 2019-01-31 ENCOUNTER — Other Ambulatory Visit: Payer: Self-pay

## 2019-01-31 ENCOUNTER — Other Ambulatory Visit: Payer: Self-pay | Admitting: Vascular Surgery

## 2019-01-31 ENCOUNTER — Inpatient Hospital Stay: Payer: Medicaid Other

## 2019-01-31 DIAGNOSIS — Z79899 Other long term (current) drug therapy: Secondary | ICD-10-CM | POA: Insufficient documentation

## 2019-01-31 DIAGNOSIS — C169 Malignant neoplasm of stomach, unspecified: Secondary | ICD-10-CM | POA: Insufficient documentation

## 2019-01-31 DIAGNOSIS — F418 Other specified anxiety disorders: Secondary | ICD-10-CM | POA: Insufficient documentation

## 2019-01-31 DIAGNOSIS — Z9221 Personal history of antineoplastic chemotherapy: Secondary | ICD-10-CM | POA: Insufficient documentation

## 2019-01-31 DIAGNOSIS — I70229 Atherosclerosis of native arteries of extremities with rest pain, unspecified extremity: Secondary | ICD-10-CM

## 2019-01-31 DIAGNOSIS — Z48812 Encounter for surgical aftercare following surgery on the circulatory system: Secondary | ICD-10-CM

## 2019-01-31 DIAGNOSIS — Z95828 Presence of other vascular implants and grafts: Secondary | ICD-10-CM

## 2019-01-31 DIAGNOSIS — R1310 Dysphagia, unspecified: Secondary | ICD-10-CM | POA: Diagnosis not present

## 2019-01-31 DIAGNOSIS — F17210 Nicotine dependence, cigarettes, uncomplicated: Secondary | ICD-10-CM | POA: Insufficient documentation

## 2019-01-31 DIAGNOSIS — R634 Abnormal weight loss: Secondary | ICD-10-CM | POA: Insufficient documentation

## 2019-01-31 DIAGNOSIS — I70213 Atherosclerosis of native arteries of extremities with intermittent claudication, bilateral legs: Secondary | ICD-10-CM

## 2019-01-31 DIAGNOSIS — R630 Anorexia: Secondary | ICD-10-CM | POA: Insufficient documentation

## 2019-01-31 DIAGNOSIS — C162 Malignant neoplasm of body of stomach: Secondary | ICD-10-CM

## 2019-01-31 DIAGNOSIS — I700 Atherosclerosis of aorta: Secondary | ICD-10-CM | POA: Insufficient documentation

## 2019-01-31 DIAGNOSIS — J449 Chronic obstructive pulmonary disease, unspecified: Secondary | ICD-10-CM | POA: Diagnosis not present

## 2019-01-31 DIAGNOSIS — I998 Other disorder of circulatory system: Secondary | ICD-10-CM

## 2019-01-31 LAB — CBC WITH DIFFERENTIAL/PLATELET
Abs Immature Granulocytes: 0.02 10*3/uL (ref 0.00–0.07)
Basophils Absolute: 0.1 10*3/uL (ref 0.0–0.1)
Basophils Relative: 1 %
Eosinophils Absolute: 0.2 10*3/uL (ref 0.0–0.5)
Eosinophils Relative: 3 %
HCT: 43.4 % (ref 39.0–52.0)
Hemoglobin: 14.6 g/dL (ref 13.0–17.0)
Immature Granulocytes: 0 %
Lymphocytes Relative: 32 %
Lymphs Abs: 2.4 10*3/uL (ref 0.7–4.0)
MCH: 29.6 pg (ref 26.0–34.0)
MCHC: 33.6 g/dL (ref 30.0–36.0)
MCV: 87.9 fL (ref 80.0–100.0)
Monocytes Absolute: 0.6 10*3/uL (ref 0.1–1.0)
Monocytes Relative: 7 %
Neutro Abs: 4.2 10*3/uL (ref 1.7–7.7)
Neutrophils Relative %: 57 %
Platelets: 215 10*3/uL (ref 150–400)
RBC: 4.94 MIL/uL (ref 4.22–5.81)
RDW: 14.1 % (ref 11.5–15.5)
WBC: 7.4 10*3/uL (ref 4.0–10.5)
nRBC: 0 % (ref 0.0–0.2)

## 2019-01-31 LAB — COMPREHENSIVE METABOLIC PANEL
ALT: 12 U/L (ref 0–44)
AST: 14 U/L — ABNORMAL LOW (ref 15–41)
Albumin: 3.8 g/dL (ref 3.5–5.0)
Alkaline Phosphatase: 109 U/L (ref 38–126)
Anion gap: 9 (ref 5–15)
BUN: 6 mg/dL — ABNORMAL LOW (ref 8–23)
CO2: 27 mmol/L (ref 22–32)
Calcium: 9.3 mg/dL (ref 8.9–10.3)
Chloride: 104 mmol/L (ref 98–111)
Creatinine, Ser: 0.63 mg/dL (ref 0.61–1.24)
GFR calc Af Amer: >60 mL/min (ref >60)
GFR calc non Af Amer: >60 mL/min (ref >60)
Glucose, Bld: 80 mg/dL (ref 70–99)
Potassium: 4.2 mmol/L (ref 3.5–5.1)
Sodium: 140 mmol/L (ref 135–145)
Total Bilirubin: 0.4 mg/dL (ref 0.3–1.2)
Total Protein: 7.1 g/dL (ref 6.5–8.1)

## 2019-01-31 MED ORDER — HEPARIN SOD (PORK) LOCK FLUSH 100 UNIT/ML IV SOLN
500.0000 [IU] | Freq: Once | INTRAVENOUS | Status: AC
  Administered 2019-01-31: 500 [IU]
  Filled 2019-01-31: qty 5

## 2019-01-31 MED ORDER — SODIUM CHLORIDE 0.9% FLUSH
10.0000 mL | Freq: Once | INTRAVENOUS | Status: AC
  Administered 2019-01-31: 10 mL
  Filled 2019-01-31: qty 10

## 2019-01-31 NOTE — Telephone Encounter (Signed)
-----   Message from Alla Feeling, NP sent at 01/30/2019  4:05 PM EDT ----- Regarding: RE: Peer to Peer CT was denied on today's peer to peer, only annual surveillance CT would be approved. If he has new issues or is symptomatic at next f/u we can resubmit for approval. Please cancel CT on 7/24 and notify the patient. T, please have him keep his f/u on 8/7 and add lab, flush beforehand. Thanks, Regan Rakers  ----- Message ----- From: Elliot Gault Sent: 01/30/2019   2:25 PM EDT To: Alla Feeling, NP, Johney Maine, RN Subject: Peer to Peer                                   Good afternoon - I have a peer to peer scheduled for today at 4:00. They will call [phone].  Thank you,  Velna Hatchet

## 2019-01-31 NOTE — Telephone Encounter (Signed)
Spoke with pt, advised per Cira Rue NP:  CT was denied on today's peer to peer, only annual surveillance CT would be approved. If he has new issues or is symptomatic at next f/u we can resubmit for approval. Please cancel CT on 7/24 and notify the patient. T, please have him keep his f/u on 8/7 and add lab, flush beforehand.  Thanks,  Lacie   Pt verbalized understanding. Advised pt that scheduling will give him a call to let him know what time to come for lab and flush appointment.

## 2019-02-04 ENCOUNTER — Ambulatory Visit (INDEPENDENT_AMBULATORY_CARE_PROVIDER_SITE_OTHER): Payer: Medicaid Other | Admitting: Pulmonary Disease

## 2019-02-04 ENCOUNTER — Encounter: Payer: Self-pay | Admitting: Pulmonary Disease

## 2019-02-04 ENCOUNTER — Other Ambulatory Visit: Payer: Self-pay

## 2019-02-04 VITALS — BP 136/78 | HR 121 | Temp 98.1°F | Ht 65.0 in | Wt 111.4 lb

## 2019-02-04 DIAGNOSIS — J449 Chronic obstructive pulmonary disease, unspecified: Secondary | ICD-10-CM | POA: Diagnosis not present

## 2019-02-04 MED ORDER — ESCITALOPRAM OXALATE 10 MG PO TABS: 10.0000 mg | ORAL_TABLET | Freq: Every day | ORAL | 3 refills | Status: DC

## 2019-02-04 NOTE — Progress Notes (Signed)
Bobby Caldwell    [id number]    22-Jul-1955  Primary Care Physician:Patient, No Pcp Per  Referring Physician: No referring provider defined for this encounter.  Chief complaint:   Patient being seen for follow-up for obstructive lung disease  HPI:  History of COPD, continues to smoke actively but trying to cut down Was last seen in January Stable symptoms generally  Recently treated for critical limb ischemia Recent treatment for stomach cancer  Has severe COPD/emphysema  Most significant concern today was worsening depression  Decreased appetite Stable lung nodules as of 08/14/2018  Started smoking at age 24, greater than 80-pack-year smoking history currently smoking about half a pack per day Was able to quit for a while but currently back smoking actively  He feels generally well, states he is under a lot of stress and this is contributing to him continuing to smoke actively  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 budesonide-formoterol (SYMBICORT) 160-4.5 MCG/ACT inhaler Inhale 2 puffs into the lungs 2 (two) times daily.  Marland Kitchen buPROPion (ZYBAN) 150 MG 12 hr tablet Take 150 mg by mouth daily.  Marland Kitchen ipratropium-albuterol (DUONEB) 0.5-2.5 (3) MG/3ML SOLN USE 1 AMPULE IN NEBULIZER 4 TIMES DAILY (Patient taking differently: Inhale 3 mLs into the lungs 4 (four) times daily. )  . mirtazapine (REMERON) 7.5 MG tablet Take 1 tablet (7.5 mg total) by mouth at bedtime.  Marland Kitchen omeprazole (PRILOSEC) 40 MG capsule Take 1 capsule (40 mg total) by mouth daily.  . Tiotropium Bromide Monohydrate (SPIRIVA RESPIMAT) 2.5 MCG/ACT AERS Inhale 2 puffs into the lungs daily.  . chlorhexidine (PERIDEX) 0.12 % solution Use as directed 15 mLs in the mouth or throat 2 (two) times daily.   Marland Kitchen nystatin (MYCOSTATIN) 100000 UNIT/ML suspension Take 5 mLs (500,000 Units total) by mouth 4 (four) times daily. Rinse mouth for 2 minutes and spit (Patient not taking: Reported on 02/04/2019)   No facility-administered encounter medications on file as of 02/04/2019.     Allergies as of 02/04/2019  . (No Known Allergies)    Past Medical History:  Diagnosis Date  . COPD (chronic obstructive pulmonary disease) ([place])   . Dyspnea   . PAD (peripheral artery disease) ([place])   . Stomach cancer ([place])   . Tobacco abuse   . Weight loss     Past Surgical History:  Procedure Laterality Date  . ABDOMINAL AORTOGRAM W/LOWER EXTREMITY N/A 05/03/2017   Procedure: ABDOMINAL AORTOGRAM W/LOWER EXTREMITY;  Surgeon: Conrad Eldridge, MD;  Location: [facility];  Service: Cardiovascular;  Laterality: N/A;  . BIOPSY  08/22/2018   Procedure: BIOPSY;  Surgeon: [name], MD;  Location: [facility];  Service: Endoscopy;;  . ESOPHAGOGASTRODUODENOSCOPY     05/24/17  . ESOPHAGOGASTRODUODENOSCOPY (EGD) WITH PROPOFOL N/A 05/24/2017   Procedure: ESOPHAGOGASTRODUODENOSCOPY (EGD) WITH PROPOFOL;  Surgeon: [name], MD;  Location: [facility];  Service: Endoscopy;  Laterality: N/A;  . ESOPHAGOGASTRODUODENOSCOPY (EGD) WITH PROPOFOL N/A 08/22/2018   Procedure: ESOPHAGOGASTRODUODENOSCOPY (EGD) WITH PROPOFOL;  Surgeon: [name], MD;  Location: [facility];  Service: Endoscopy;  Laterality: N/A;  . ESOPHAGOGASTRODUODENOSCOPY (EGD) WITH PROPOFOL N/A 11/28/2018   Procedure: ESOPHAGOGASTRODUODENOSCOPY (EGD) WITH PROPOFOL;  Surgeon: [name], MD;  Location: [facility];  Service: Endoscopy;  Laterality: N/A;  .  EUS N/A 06/07/2017   Procedure: UPPER ENDOSCOPIC ULTRASOUND (EUS) RADIAL;  Surgeon: [name], MD;  Location: [facility];  Service: Endoscopy;  Laterality: N/A;  . FEMORAL-FEMORAL BYPASS GRAFT Bilateral 05/08/2017   Procedure: LEFT TO RIGHT BYPASS GRAFT FEMORAL-FEMORAL  ARTERY;  Surgeon: Conrad Buckshot, MD;  Location: [facility];  Service: Vascular;  Laterality: Bilateral;  . IR FLUORO GUIDE PORT INSERTION RIGHT  07/05/2017  . IR US GUIDE VASC ACCESS RIGHT  07/05/2017  . LAPAROSCOPIC INSERTION GASTROSTOMY TUBE N/A 11/27/2017   Procedure: JEJUNOSTOMY TUBE PLACEMENT;  Surgeon: [name], MD;  Location: [facility];  Service: General;  Laterality: N/A;  . LAPAROSCOPIC PARTIAL GASTRECTOMY N/A 11/27/2017   Procedure: PARTIAL GASTRECTOMY;  Surgeon: [name], MD;  Location: [facility];  Service: General;  Laterality: N/A;  . LAPAROSCOPY N/A 11/27/2017   Procedure: LAPAROSCOPY DIAGNOSTIC;  Surgeon: [name], MD;  Location: [facility];  Service: General;  Laterality: N/A;  . NM MYOVIEW LTD  06/2017   LOW RISK. Small basal inferoseptal defect thought to be related to artifact (although cannot exclude prior infarct). Normal wall motion i in this area would suggest artifact not infarct. Otherwise no ischemia or infarction.  Marland Kitchen PERIPHERAL VASCULAR INTERVENTION Left 05/03/2017   Procedure: PERIPHERAL VASCULAR INTERVENTION;  Surgeon: Conrad Maynard, MD;  Location: [facility];  Service: Cardiovascular;  Laterality: Left;  common iliac  . POLYPECTOMY  11/28/2018   Procedure: POLYPECTOMY;  Surgeon: [name], MD;  Location: [facility];  Service: Endoscopy;;  . TRANSTHORACIC ECHOCARDIOGRAM  06/26/2017   Normal LV size and function. EF 60-65%. No wall motion abnormalities. GR 1 DD. Mild LA dilation    Family History  Family history unknown: Yes    Social History   Socioeconomic History  . Marital status: Married    Spouse name: Not on file  . Number of children: 4  . Years of education: Not on file  . Highest education level: Not on file  Occupational History  . Not on file  Social Needs  . Financial resource strain: Not on file  . Food insecurity    Worry: Not on file    Inability: Not on file  . Transportation needs    Medical: Not on file    Non-medical:  Not on file  Tobacco Use  . Smoking status: Current Every Day Smoker    Packs/day: 0.50    Years: 50.00    Pack years: 25.00    Types: Cigarettes    Last attempt to quit: 11/2017    Years since quitting: 1.2  . Smokeless tobacco: Never Used  . Tobacco comment: 1/2 pk a day 02/04/19  Substance and Sexual Activity  . Alcohol use: No    Alcohol/week: 0.0 standard drinks  . Drug use: No  . Sexual activity: Yes  Lifestyle  . Physical activity    Days per week: Not on file    Minutes per session: Not on file  . Stress: Not on file  Relationships  . Social Herbalist on phone: Not on file    Gets together: Not on file    Attends religious service: Not on file    Active member of club or organization: Not on file    Attends meetings of clubs or organizations: Not on file    Relationship status: Not on file  . Intimate partner violence    Fear of current or ex partner: Not on file    Emotionally  abused: Not on file    Physically abused: Not on file    Forced sexual activity: Not on file  Other Topics Concern  . Not on file  Social History Narrative  . Not on file    Review of Systems  Constitutional: Negative for fatigue.  HENT: Negative.   Eyes: Negative.   Respiratory: Positive for shortness of breath. Negative for cough, choking and chest tightness.   Cardiovascular: Negative.   Gastrointestinal: Negative.   Endocrine: Negative.   Psychiatric/Behavioral:       Depression    Vitals:   02/04/19 1157  BP: 136/78  Pulse: (!) 121  Temp: 98.1 F (36.7 C)  SpO2: 94%     Physical Exam  Constitutional: He is oriented to person, place, and time.  Underweight  HENT:  Head: Normocephalic and atraumatic.  Eyes: Pupils are equal, round, and reactive to light. Conjunctivae and EOM are normal. Right eye exhibits no discharge. Left eye exhibits no discharge.  Neck: Normal range of motion. Neck supple. No tracheal deviation present. No thyromegaly present.   Cardiovascular: Normal rate and regular rhythm.  Pulmonary/Chest: Effort normal and breath sounds normal. No respiratory distress. He has no wheezes. He has no rales.  Abdominal: Soft. Bowel sounds are normal. He exhibits no distension. There is no abdominal tenderness. There is no rebound.  Musculoskeletal: Normal range of motion.        General: No deformity or edema.  Neurological: He is alert and oriented to person, place, and time.  Skin: Skin is warm and dry. No rash noted. No erythema.   Data Reviewed:  Hospital records reviewed Spirometry from 2019 revealed severe obstructive disease   Assessment:  .  Advanced COPD .  Active smoker .  History of gastric cancer .  Referral arterial disease .  Depression  Plan/Recommendations: .  We will switch to Lexapro .  Continue bronchodilator treatment .  Continue Spiriva .  Continue Symbicort  I will see him back in the office in about 3 months   Sherrilyn Rist MD [facility] 02/04/2019, 12:08 PM  CC: No ref. provider found

## 2019-02-04 NOTE — Patient Instructions (Signed)
Advanced COPD Shortness of breath  Worsening depression  Switch to Lexapro Continue bronchodilators  Continue to work on quitting smoking  I will see you back in the office in 3 months Call with significant concerns

## 2019-02-07 ENCOUNTER — Other Ambulatory Visit: Payer: Medicaid Other

## 2019-02-12 NOTE — Progress Notes (Signed)
[place]   Telephone:([phone]) [phone] [phone]) [phone]   Clinic Follow up Note   Patient Care Team: Patient, No Pcp Per as PCP - General (General Practice) [name], MD as Consulting Physician (Pulmonary Disease) [name], MD as Attending Physician (Gastroenterology) [name], MD as Consulting Physician (General Surgery)  Date of Service:  02/15/2019  CHIEF COMPLAINT:  F/u on gastric adenocarcinoma  SUMMARY OF ONCOLOGIC HISTORY: Oncology History Overview Note  Cancer Staging Gastric cancer [facility]) Staging form: Stomach, AJCC 8th Edition - Clinical stage from 05/24/2017: Stage I (cT2, cN0, cM0) - Signed by [name], MD on 06/19/2017 - Pathologic stage from 11/27/2017: Stage I (ypT1b, pN0, cM0) - Signed by [name], MD on 04/04/2018     Gastric adenocarcinoma ([place])  05/24/2017 Procedure   EGD by Dr. [name] 05/24/17 IMPRESSION Ulcerated mass like region in the distal stomach (4cm across), extensively biopsied. This appears neoplastic. - Abnormal mucosa throughout stomach otherwise (mild-moderate gastritis), also extensively biopsied.   05/24/2017 Initial Biopsy   Diagnosis 05/24/17  1. Stomach, biopsy, mass distal - ADENOCARCINOMA, SEE COMMENT. - CHRONIC ACTIVE GASTRITIS WITH INTESTINAL METAPLASIA. 2. 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 - NO DYSPLASIA OR MALIGNANCY   06/04/2017 Initial Diagnosis   Gastric cancer ([place])   06/07/2017 Procedure   EUS by Dr. [name] on 06/07/17  IMPRESSION:  5cm across, partially circumferential uT2N0 gastric adenocarcinoma laying in the distal stomach 3-4cm proximal to the pylorus.   06/22/2017 Imaging   CT CAP W Contrast 06/22/17  IMPRESSION: 1. The gastric mass is somewhat inconspicuous on CT, but a potential 1.2 cm thick mucosal masses seen along the posterior gastric border on image 83/2. There several small  adjacent right gastric lymph nodes measuring up to 0.7 cm in short axis, but no overtly pathologic adenopathy and no findings of hepatic or peritoneal metastatic disease at this time. 2. Calcified granulomas in both lungs along with several noncalcified nodules stable from August 2016 and likely benign given this long-term stability. 3. Other imaging findings of potential clinical significance: Aortic Atherosclerosis (ICD10-I70.0) and Emphysema (ICD10-J43.9). Airway thickening is present, suggesting bronchitis or reactive airways disease. 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 Disc bulges at L3-4 and L4-5.   07/05/2017 Surgery   PAC placement    07/07/2017 - 09/06/2017 Chemotherapy   Neoadjuvant FOLFOX with with leucovorin every 2 weeks. Plan for 6 cycles but stopped after 4 cycles due to poor tolerance     09/10/2017 Imaging    CT AP W Contrast 09/10/17 IMPRESSION: 1. No acute findings identified within the abdomen or pelvis. 2. Interval development of multiple bilateral pulmonary densities within both lower lobes. These are indeterminate, likely post infectious or inflammatory in etiology. Atypical infection not excluded. Metastatic disease is considered less favored but not entirely excluded and follow-up imaging to ensure resolution is advised. 3. No evidence to suggest recurrent tumor or metastatic disease within the abdomen or pelvis. 4.  Aortic Atherosclerosis (ICD10-I70.0).   11/27/2017 Surgery    Surgery He had partial gastrectomy with Dr. [name] on 11/27/2017.   11/27/2017 Pathology Results   11/27/2017 Surgical Pathology Diagnosis 1. Stomach, resection for tumor, Distal w/ Omentum - ADENOCARCINOMA, MODERATELY DIFFERENTIATED, 3.8 CM - LYMPHOVASCULAR SPACE AND PERINEURAL INVASION - NO CARCINOMA IDENTIFIED IN FOUR LYMPH NODES (0/4) - SEE ONCOLOGY TABLE AND COMMENT BELOW 2. Lymph node, biopsy,  Portal - NO  CARCINOMA IDENTIFIED IN ONE LYMPH NODE (0/1) - SEE COMMENT 3. Lymph node, biopsy, Left Gastric - NO CARCINOMA IDENTIFIED IN TWO LYMPH NODES (0/2) - SEE COMMENT 4. Omentum, resection for tumor - BENIGN FIBROADIPOSE TISSUE - NO CARCINOMA IDENTIFIED    11/27/2017 Cancer Staging   Staging form: Stomach, AJCC 8th Edition - Pathologic stage from 11/27/2017: Stage I (ypT1b, pN0, cM0) - Signed by [name], MD on 04/04/2018   08/14/2018 Imaging   CT CAP 08/14/18 IMPRESSION:  1. No findings of active malignancy. 2. Generally stable pulmonary nodules are identified in the lungs. The only new nodule seen today is a 3 mm right apical nodule on image 32/4 which may merit surveillance. Many of the basilar nodules shown on the prior CT abdomen from 09/10/2017 have resolved. 3. Other imaging findings of potential clinical significance: Aortic Atherosclerosis (ICD10-I70.0). Emphysema (ICD10-J43.9). Lumbar degenerative disc disease.   08/22/2018 Procedure   Upper endoscopy 08/22/18 by Dr Verdie Drown The esophagus was slightly tortuous but there were no strictures/stenosis and the mucosa was normal throughout. The gastro-jejunostomy site was widely patent, Bilroth II anatomy. There was a 1.5-2cm region of the anastomosis that was discretely abnormal, soft but somewhat neoplastic appearing and nodular. I biopsied this region extensively. Diagnosis Stomach, biopsy, GJ anastomosis erythema - LOW GRADE GLANDULAR DYSPLASIA. - GOBLET CELL METAPLASIA. - SEE COMMENT.   11/28/2018 Procedure   Upper Endoscopy 11/28/18 by Dr Verdie Drown - Bilroth II anatomy. The previously noted adenomatous polyp was again located at the Prentiss anastomosis. It was soft, sessile, about 1.9cm across in greatest dimension. I used mixed cold/cautery snare resection to (apparently) completely remove the polyp. Diagnosis Stomach, polyp(s) - LOW GRADE GLANDULAR DYSPLASIA. - GOBLET CELL METAPLASIA.       CURRENT THERAPY:  Surveillance  INTERVAL HISTORY:  Bobby Caldwell is here for a follow up gastric adenocarcinoma. He presents to the clinic alone. He notes he planned to have dentures placed today but forget about our appointment today. He notes having breathing issues. This causes him to not walk far. He still smokes 10 cigarettes a day. He denies abdominal issues.  He notes insurance notifications about not approving his CT scan.  I reviewed medication list with him. He notes help from megace more than mirtazapine but still takes both.  He also has Wellbutrin.   REVIEW OF SYSTEMS:   Constitutional: Denies fevers, chills or abnormal weight loss Eyes: Denies blurriness of vision Ears, nose, mouth, throat, and face: Denies mucositis or sore throat Respiratory: Denies cough or wheezes (+) Mild trouble breathing Cardiovascular: Denies palpitation, chest discomfort or lower extremity swelling Gastrointestinal:  Denies nausea, heartburn or change in bowel habits Skin: Denies abnormal skin rashes Lymphatics: Denies new lymphadenopathy or easy bruising Neurological:Denies numbness, tingling or new weaknesses Behavioral/Psych: Mood is stable, no new changes  All other systems were reviewed with the patient and are negative.  MEDICAL HISTORY:  Past Medical History:  Diagnosis Date  . COPD (chronic obstructive pulmonary disease) ([place])   . Dyspnea   . PAD (peripheral artery disease) ([place])   . Stomach cancer ([place])   . Tobacco abuse   . Weight loss     SURGICAL HISTORY: Past Surgical History:  Procedure Laterality Date  . ABDOMINAL AORTOGRAM W/LOWER EXTREMITY N/A 05/03/2017   Procedure: ABDOMINAL AORTOGRAM W/LOWER EXTREMITY;  Surgeon: Conrad Philadelphia, MD;  Location: [place] [facility];  Service: Cardiovascular;  Laterality: N/A;  . BIOPSY  08/22/2018   Procedure: BIOPSY;  Surgeon: [name],  Melene Plan, MD;  Location: [facility];  Service: Endoscopy;;  . ESOPHAGOGASTRODUODENOSCOPY     05/24/17   . ESOPHAGOGASTRODUODENOSCOPY (EGD) WITH PROPOFOL N/A 05/24/2017   Procedure: ESOPHAGOGASTRODUODENOSCOPY (EGD) WITH PROPOFOL;  Surgeon: [name], MD;  Location: [facility];  Service: Endoscopy;  Laterality: N/A;  . ESOPHAGOGASTRODUODENOSCOPY (EGD) WITH PROPOFOL N/A 08/22/2018   Procedure: ESOPHAGOGASTRODUODENOSCOPY (EGD) WITH PROPOFOL;  Surgeon: [name], MD;  Location: [facility];  Service: Endoscopy;  Laterality: N/A;  . ESOPHAGOGASTRODUODENOSCOPY (EGD) WITH PROPOFOL N/A 11/28/2018   Procedure: ESOPHAGOGASTRODUODENOSCOPY (EGD) WITH PROPOFOL;  Surgeon: [name], MD;  Location: [facility];  Service: Endoscopy;  Laterality: N/A;  . EUS N/A 06/07/2017   Procedure: UPPER ENDOSCOPIC ULTRASOUND (EUS) RADIAL;  Surgeon: [name], MD;  Location: [facility];  Service: Endoscopy;  Laterality: N/A;  . FEMORAL-FEMORAL BYPASS GRAFT Bilateral 05/08/2017   Procedure: LEFT TO RIGHT BYPASS GRAFT FEMORAL-FEMORAL ARTERY;  Surgeon: Conrad Moran, MD;  Location: [place];  Service: Vascular;  Laterality: Bilateral;  . IR FLUORO GUIDE PORT INSERTION RIGHT  07/05/2017  . IR US GUIDE VASC ACCESS RIGHT  07/05/2017  . LAPAROSCOPIC INSERTION GASTROSTOMY TUBE N/A 11/27/2017   Procedure: JEJUNOSTOMY TUBE PLACEMENT;  Surgeon: [name], MD;  Location: [place];  Service: General;  Laterality: N/A;  . LAPAROSCOPIC PARTIAL GASTRECTOMY N/A 11/27/2017   Procedure: PARTIAL GASTRECTOMY;  Surgeon: [name], MD;  Location: [place];  Service: General;  Laterality: N/A;  . LAPAROSCOPY N/A 11/27/2017   Procedure: LAPAROSCOPY DIAGNOSTIC;  Surgeon: [name], MD;  Location: [place];  Service: General;  Laterality: N/A;  . NM MYOVIEW LTD  06/2017   LOW RISK. Small basal inferoseptal defect thought to be related to artifact (although cannot exclude prior infarct). Normal wall motion i in this area would suggest artifact not infarct. Otherwise no ischemia or infarction.  Marland Kitchen PERIPHERAL VASCULAR  INTERVENTION Left 05/03/2017   Procedure: PERIPHERAL VASCULAR INTERVENTION;  Surgeon: Conrad [place]. T[place]any, MD;  Location: [place] [facility];  Service: Cardiovascular;  Laterality: Left;  common iliac  . POLYPECTOMY  11/28/2018   Procedure: POLYPECTOMY;  Surgeon: [name], MD;  Location: [facility];  Service: Endoscopy;;  . TRANSTHORACIC ECHOCARDIOGRAM  06/26/2017   Normal LV size and function. EF 60-65%. No wall motion abnormalities. GR 1 DD. Mild LA dilation    I have reviewed the social history and family history with the patient and they are unchanged from previous note.  ALLERGIES:  has No Known Allergies.  MEDICATIONS:  Current Outpatient Medications  Medication Sig Dispense Refill  . budesonide-formoterol (SYMBICORT) 160-4.5 MCG/ACT inhaler Inhale 2 puffs into the lungs 2 (two) times daily. 1 Inhaler 6  . buPROPion (ZYBAN) 150 MG 12 hr tablet Take 150 mg by mouth daily.    . chlorhexidine (PERIDEX) 0.12 % solution Use as directed 15 mLs in the mouth or throat 2 (two) times daily.    Marland Kitchen escitalopram (LEXAPRO) 10 MG tablet Take 1 tablet (10 mg total) by mouth at bedtime. 30 tablet 3  . ipratropium-albuterol (DUONEB) 0.5-2.5 (3) MG/3ML SOLN USE 1 AMPULE IN NEBULIZER 4 TIMES DAILY (Patient taking differently: Inhale 3 mLs into the lungs 4 (four) times daily. ) 360 mL 2  . megestrol (MEGACE) 40 MG/ML suspension Take by mouth daily.    . mirtazapine (REMERON) 7.5 MG tablet Take 7.5 mg by mouth at bedtime.    . Tiotropium Bromide Monohydrate (SPIRIVA RESPIMAT) 2.5 MCG/ACT AERS Inhale 2 puffs into the lungs daily.  No current facility-administered medications for this visit.     PHYSICAL EXAMINATION: ECOG PERFORMANCE STATUS: 1 - Symptomatic but completely ambulatory  Vitals:   02/14/19 1315  BP: (!) 142/87  Pulse: (!) 122  Resp: 18  Temp: 98.9 F (37.2 C)  SpO2: 96%   Filed Weights   02/14/19 1315  Weight: 112 lb 4.8 oz (50.9 kg)    GENERAL:alert, no distress and  comfortable SKIN: skin color, texture, turgor are normal, no rashes or significant lesions EYES: normal, Conjunctiva are pink and non-injected, sclera clear  NECK: supple, thyroid normal size, non-tender, without nodularity LYMPH:  no palpable lymphadenopathy in the cervical, axillary  LUNGS: clear to auscultation and percussion (+) Mildly low breath sounds, COPD HEART: regular rate & rhythm and no murmurs and no lower extremity edema ABDOMEN:abdomen soft, non-tender and normal bowel sounds Musculoskeletal:no cyanosis of digits and no clubbing  NEURO: alert & oriented x 3 with fluent speech, no focal motor/sensory deficits  LABORATORY DATA:  I have reviewed the data as listed CBC Latest Ref Rng & Units 02/14/2019 01/31/2019 12/06/2018  WBC 4.0 - 10.5 K/uL 8.3 7.4 8.3  Hemoglobin 13.0 - 17.0 g/dL 13.7 14.6 14.6  Hematocrit 39.0 - 52.0 % 41.4 43.4 44.7  Platelets 150 - 400 K/uL 232 215 249     CMP Latest Ref Rng & Units 02/14/2019 01/31/2019 12/06/2018  Glucose 70 - 99 mg/dL 188(H) 80 85  BUN 8 - 23 mg/dL 7(L) 6(L) 12  Creatinine 0.61 - 1.24 mg/dL 0.68 0.63 0.82  Sodium 135 - 145 mmol/L 141 140 142  Potassium 3.5 - 5.1 mmol/L 3.3(L) 4.2 3.4(L)  Chloride 98 - 111 mmol/L 106 104 106  CO2 22 - 32 mmol/L 22 27 27   Calcium 8.9 - 10.3 mg/dL 9.2 9.3 9.0  Total Protein 6.5 - 8.1 g/dL 6.7 7.1 6.7  Total Bilirubin 0.3 - 1.2 mg/dL 0.3 0.4 0.3  Alkaline Phos 38 - 126 U/L 102 109 100  AST 15 - 41 U/L 12(L) 14(L) 10(L)  ALT 0 - 44 U/L 11 12 10       RADIOGRAPHIC STUDIES: I have personally reviewed the radiological images as listed and agreed with the findings in the report. No results found.   ASSESSMENT & PLAN:  Bobby Caldwell is a 63 y.o. male with    1. Gastric Adenocarcinoma, uT2N0M0, stage I, ypT1bN0 -Diagnosed in 05/2017. Treated withneoadjuvantchemo and surgery. Currently on surveillance. -Surveillance CT CAP from 08/14/18 shows no concern for cancer recurrence, stable small lung nodules  like benign due to smoking history, no other concerns.  -I discussed his latest endoscopy by Dr Nicholaus Corolla on 11/28/18 revealed benign surgical changes, biopsy was negative for malignant cells  -He is clinically doing well. Labs reviewed, CBC and CMP WNL except K 3.3, BG 188, BUN 7, AST 12. His physical exam was unremarkable.  -Continue surveillance.  -Continue Port flushes every 2 weeks  -F/u in 6 months with CT scan    2. COPD, Smoking Cessation -I previously advised him to quit smoking. -He smokes 10 cigarettes a day and is presenting with mild issues breathing.I again strongly encouraged him to quit smoking completely -he is on bupropion which helps  -f/u with pulmonary clinic  3.Depression and or anxiety -Currently on Ativan as needed. Continue and monitor.  4.Low appetite and weight loss -Repeat endoscopies in February a and May 2020 show benign polyp at [facility] anastamosis.  -He has megace, mirtazapine and Wellbutrin. He responds better to megace (he uses as  needed) but also still takes Mirtazapine. Given all 3 drugs are similar and he has been abel to gain weight well, I recommend he d/c mirtizapine. He is agreeable.   Plan -Port flush every 2 months  -He is clinically doing well and stable.  -Lab and F/u in 6 months with CT CAP W Contrast a few days before   No problem-specific Assessment & Plan notes found for this encounter.   No orders of the defined types were placed in this encounter.  All questions were answered. The patient knows to call the clinic with any problems, questions or concerns. No barriers to learning was detected. I spent 15 minutes counseling the patient face to face. The total time spent in the appointment was 20 minutes and more than 50% was on counseling and review of test results     [name], MD 02/15/2019   I, Joslyn Devon, am acting as scribe for [name], MD.   I have reviewed the above documentation for accuracy and completeness, and I  agree with the above.

## 2019-02-14 ENCOUNTER — Inpatient Hospital Stay: Payer: Medicaid Other

## 2019-02-14 ENCOUNTER — Encounter: Payer: Self-pay | Admitting: Hematology

## 2019-02-14 ENCOUNTER — Inpatient Hospital Stay: Payer: Medicaid Other | Attending: Hematology | Admitting: Hematology

## 2019-02-14 ENCOUNTER — Other Ambulatory Visit: Payer: Self-pay

## 2019-02-14 ENCOUNTER — Telehealth: Payer: Self-pay | Admitting: Hematology

## 2019-02-14 VITALS — BP 142/87 | HR 122 | Temp 98.9°F | Resp 18 | Ht 65.0 in | Wt 112.3 lb

## 2019-02-14 DIAGNOSIS — R634 Abnormal weight loss: Secondary | ICD-10-CM | POA: Diagnosis not present

## 2019-02-14 DIAGNOSIS — F17210 Nicotine dependence, cigarettes, uncomplicated: Secondary | ICD-10-CM | POA: Insufficient documentation

## 2019-02-14 DIAGNOSIS — J449 Chronic obstructive pulmonary disease, unspecified: Secondary | ICD-10-CM | POA: Diagnosis not present

## 2019-02-14 DIAGNOSIS — F329 Major depressive disorder, single episode, unspecified: Secondary | ICD-10-CM | POA: Diagnosis not present

## 2019-02-14 DIAGNOSIS — I700 Atherosclerosis of aorta: Secondary | ICD-10-CM | POA: Diagnosis not present

## 2019-02-14 DIAGNOSIS — F419 Anxiety disorder, unspecified: Secondary | ICD-10-CM | POA: Insufficient documentation

## 2019-02-14 DIAGNOSIS — Z79899 Other long term (current) drug therapy: Secondary | ICD-10-CM | POA: Diagnosis not present

## 2019-02-14 DIAGNOSIS — C169 Malignant neoplasm of stomach, unspecified: Secondary | ICD-10-CM | POA: Diagnosis present

## 2019-02-14 DIAGNOSIS — Z95828 Presence of other vascular implants and grafts: Secondary | ICD-10-CM

## 2019-02-14 DIAGNOSIS — C162 Malignant neoplasm of body of stomach: Secondary | ICD-10-CM

## 2019-02-14 LAB — CBC WITH DIFFERENTIAL/PLATELET
Abs Immature Granulocytes: 0.03 10*3/uL (ref 0.00–0.07)
Basophils Absolute: 0 10*3/uL (ref 0.0–0.1)
Basophils Relative: 1 %
Eosinophils Absolute: 0 10*3/uL (ref 0.0–0.5)
Eosinophils Relative: 0 %
HCT: 41.4 % (ref 39.0–52.0)
Hemoglobin: 13.7 g/dL (ref 13.0–17.0)
Immature Granulocytes: 0 %
Lymphocytes Relative: 22 %
Lymphs Abs: 1.9 10*3/uL (ref 0.7–4.0)
MCH: 28.8 pg (ref 26.0–34.0)
MCHC: 33.1 g/dL (ref 30.0–36.0)
MCV: 87.2 fL (ref 80.0–100.0)
Monocytes Absolute: 0.4 10*3/uL (ref 0.1–1.0)
Monocytes Relative: 5 %
Neutro Abs: 6 10*3/uL (ref 1.7–7.7)
Neutrophils Relative %: 72 %
Platelets: 232 10*3/uL (ref 150–400)
RBC: 4.75 MIL/uL (ref 4.22–5.81)
RDW: 13.6 % (ref 11.5–15.5)
WBC: 8.3 10*3/uL (ref 4.0–10.5)
nRBC: 0 % (ref 0.0–0.2)

## 2019-02-14 LAB — COMPREHENSIVE METABOLIC PANEL
ALT: 11 U/L (ref 0–44)
AST: 12 U/L — ABNORMAL LOW (ref 15–41)
Albumin: 3.7 g/dL (ref 3.5–5.0)
Alkaline Phosphatase: 102 U/L (ref 38–126)
Anion gap: 13 (ref 5–15)
BUN: 7 mg/dL — ABNORMAL LOW (ref 8–23)
CO2: 22 mmol/L (ref 22–32)
Calcium: 9.2 mg/dL (ref 8.9–10.3)
Chloride: 106 mmol/L (ref 98–111)
Creatinine, Ser: 0.68 mg/dL (ref 0.61–1.24)
GFR calc Af Amer: >60 mL/min (ref >60)
GFR calc non Af Amer: >60 mL/min (ref >60)
Glucose, Bld: 188 mg/dL — ABNORMAL HIGH (ref 70–99)
Potassium: 3.3 mmol/L — ABNORMAL LOW (ref 3.5–5.1)
Sodium: 141 mmol/L (ref 135–145)
Total Bilirubin: 0.3 mg/dL (ref 0.3–1.2)
Total Protein: 6.7 g/dL (ref 6.5–8.1)

## 2019-02-14 MED ORDER — HEPARIN SOD (PORK) LOCK FLUSH 100 UNIT/ML IV SOLN
500.0000 [IU] | Freq: Once | INTRAVENOUS | Status: AC
  Administered 2019-02-14: 13:00:00 500 [IU]
  Filled 2019-02-14: qty 5

## 2019-02-14 MED ORDER — SODIUM CHLORIDE 0.9% FLUSH
10.0000 mL | Freq: Once | INTRAVENOUS | Status: AC
  Administered 2019-02-14: 10 mL
  Filled 2019-02-14: qty 10

## 2019-02-14 NOTE — Telephone Encounter (Signed)
Scheduled per 08/07 los, patient received calender and avs report. Patient also received contrast.

## 2019-02-15 ENCOUNTER — Encounter: Payer: Self-pay | Admitting: Hematology

## 2019-02-16 ENCOUNTER — Other Ambulatory Visit: Payer: Self-pay | Admitting: Hematology

## 2019-02-16 MED ORDER — POTASSIUM CHLORIDE CRYS ER 20 MEQ PO TBCR: 20.0000 meq | EXTENDED_RELEASE_TABLET | Freq: Every day | ORAL | 0 refills | Status: DC

## 2019-02-25 ENCOUNTER — Other Ambulatory Visit: Payer: Self-pay | Admitting: Physician Assistant

## 2019-03-01 IMAGING — CT CT CHEST W/ CM
2 of 5 series · 12 of 36 positions shown, 15 images · IV contrast (omnipaque)
Comparison: Multiple exams, including 09/10/2017

CLINICAL DATA: Gastric cancer restaging

EXAM:
CT CHEST, ABDOMEN, AND PELVIS WITH CONTRAST
TECHNIQUE: Multidetector CT imaging of the chest, abdomen and pelvis was
performed following the standard protocol during bolus
administration of intravenous contrast.
CONTRAST:  100mL OMNIPAQUE IOHEXOL 300 MG/ML  SOLN

[Series 2: cap with · axial · 0.77mm/px · z∈[-612,-92]mm · 9 of 130 slices shown, 12 images]
[im 13/130  mediastinal]
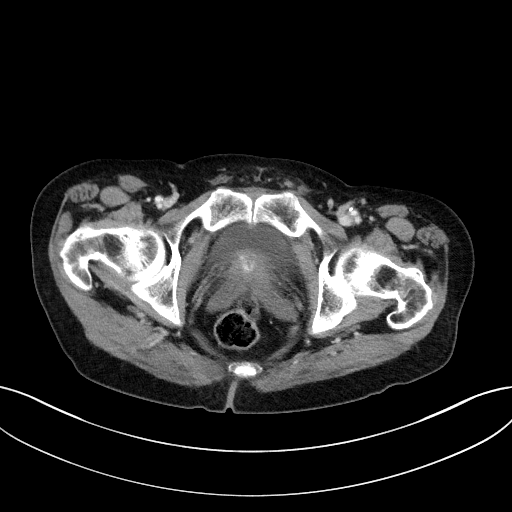
[im 13/130  lung]
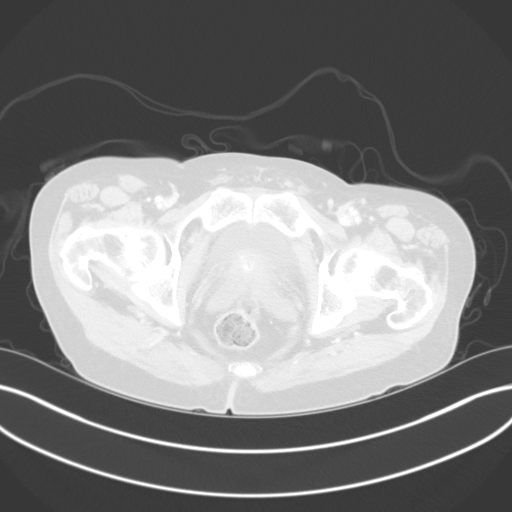
[im 26/130  lung]
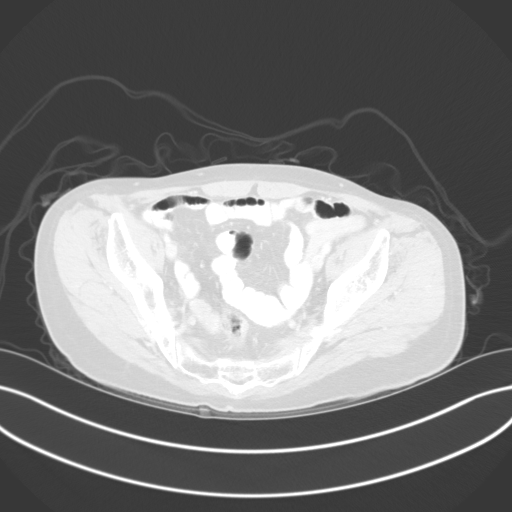
[im 39/130  lung]
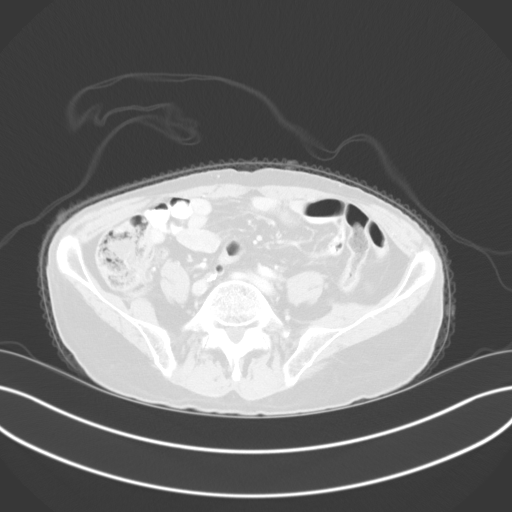
[im 52/130  lung]
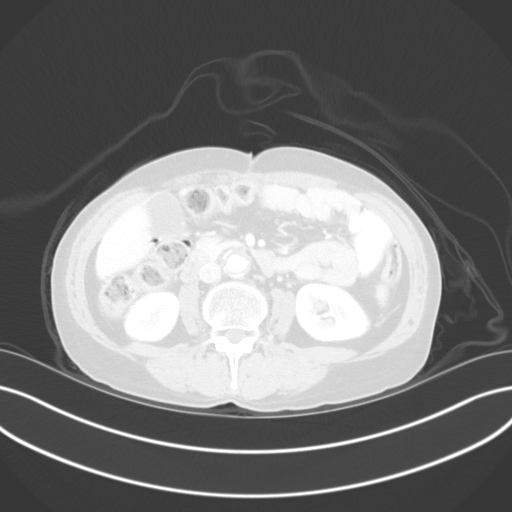
[im 65/130  mediastinal]
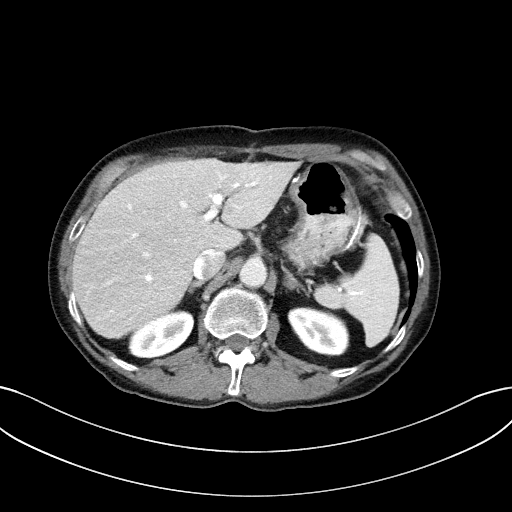
[im 65/130  lung]
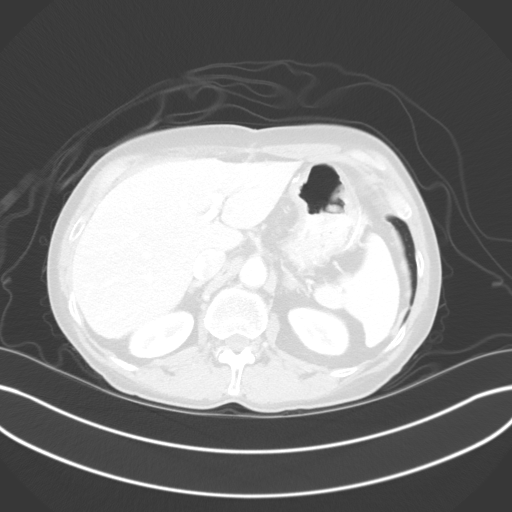
[im 78/130  lung]
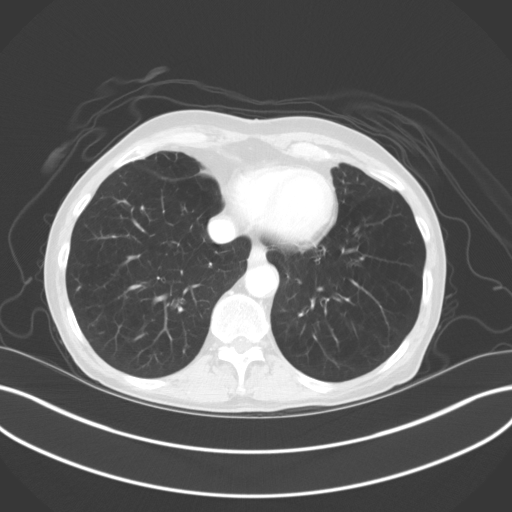
[im 91/130  lung]
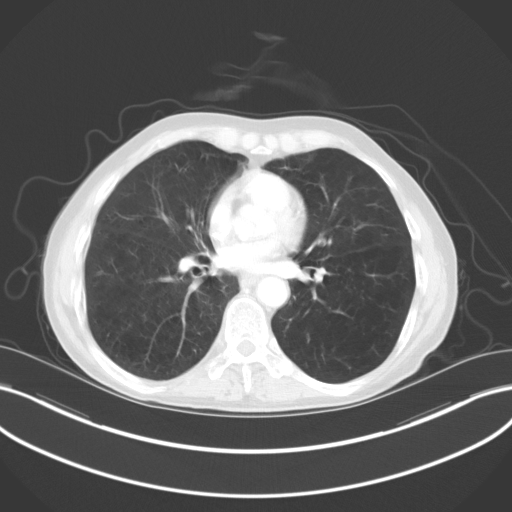
[im 104/130  lung]
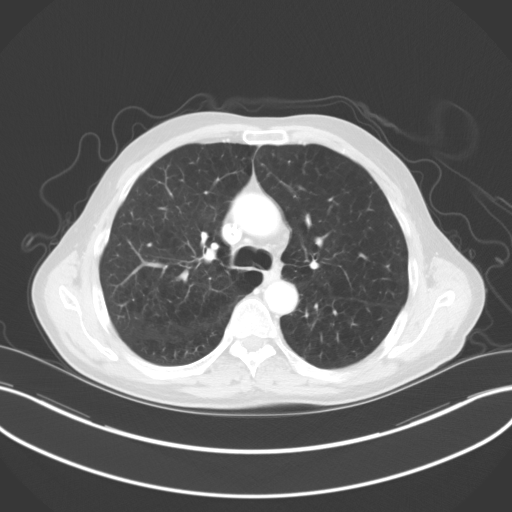
[im 117/130  mediastinal]
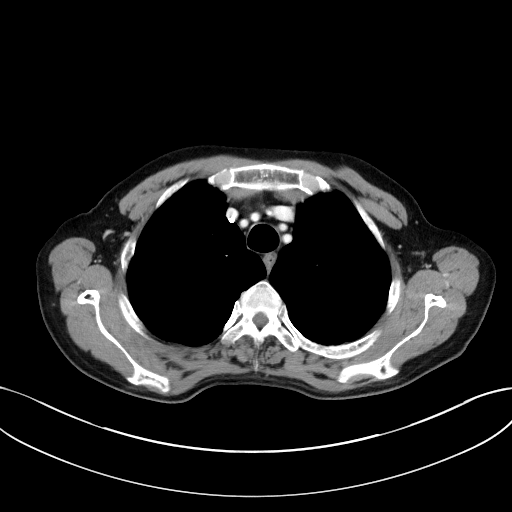
[im 117/130  lung]
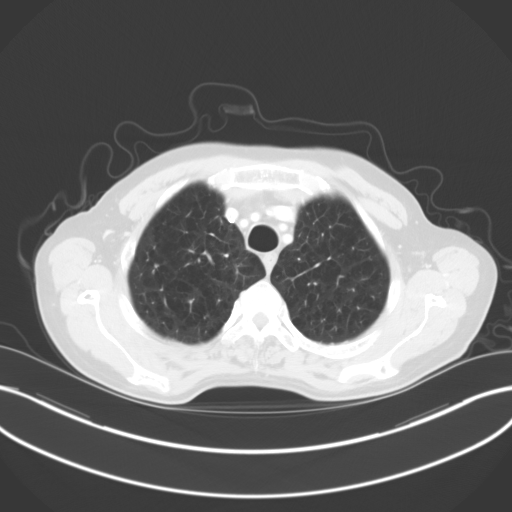

[Series 5: coronals · coronal · 0.75mm/px · 3 of 116 slices shown]
[im 24/116  lung]
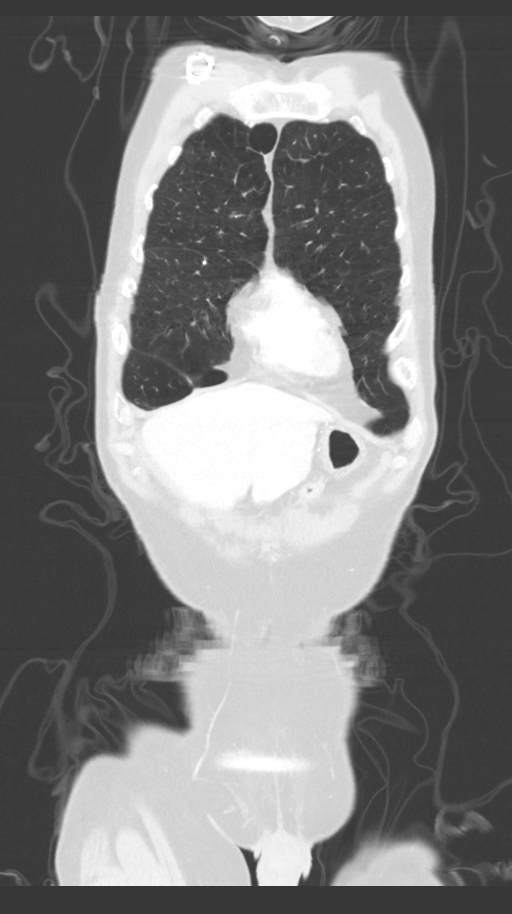
[im 47/116  lung]
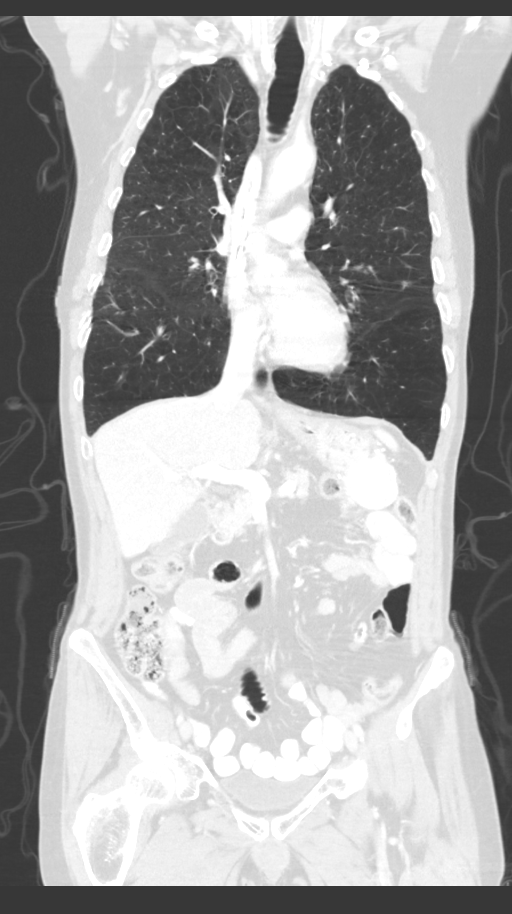
[im 70/116  lung]
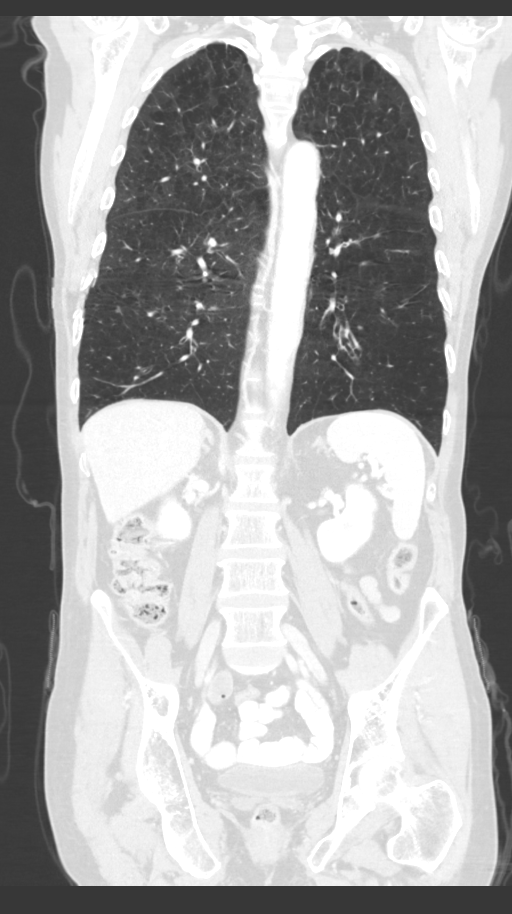

[12 of 36 positions shown; findings below may reference images not displayed]

FINDINGS: CT CHEST FINDINGS

Cardiovascular: Right Port-A-Cath tip: Cavoatrial junction.
Atherosclerotic calcification of the aortic arch.

Mediastinum/Nodes: Unremarkable

Lungs/Pleura: Centrilobular emphysema. New 3 mm right upper lobe
pulmonary nodule on image 32/4. Stable 4 mm subpleural nodule along
the right middle lobe side of the major fissure on image 93/4.
by 0.6 cm right middle lobe pulmonary nodule on image 109/4,
formerly 0.6 by 0.5 cm. Stable 5 by 4 mm left apical pulmonary
nodule on image [DATE]. Stable densely calcified granuloma in the left
upper lobe on image 47/4. Stable left upper lobe subpleural 5 mm
nodule on image 120/4. Stable densely calcified right lower lobe
granuloma on image 116/4. Interval clearing of peripheral secondary
pulmonary lobular airspace opacity in the left lower lobe shown on
the prior exam. Stable 3 mm right lower lobe pulmonary nodule on
image 124/4.

Interval clearing of the basilar nodularity shown on 09/10/2017.

Musculoskeletal: Unremarkable

CT ABDOMEN PELVIS FINDINGS

Hepatobiliary: Unremarkable

Pancreas: Unremarkable

Spleen: Unremarkable

Adrenals/Urinary Tract: Unremarkable

Stomach/Bowel: Postoperative findings with patent gastrojejunostomy.

Vascular/Lymphatic: Aortoiliac atherosclerotic vascular disease.
Left common iliac artery stent. Patent fem-fem bypass.

Reproductive: Unremarkable

Other: Small rim calcified mesenteric node on image 88/2, stable.

Musculoskeletal: Degenerative disc disease at L3-4 and L4-5.
IMPRESSION: 1. No findings of active malignancy.
2. Generally stable pulmonary nodules are identified in the lungs.
The only new nodule seen today is a 3 mm right apical nodule on
image 32/4 which may merit surveillance. Many of the basilar nodules
shown on the prior CT abdomen from 09/10/2017 have resolved.
3. Other imaging findings of potential clinical significance: Aortic
Atherosclerosis (H2EKM-IW4.4). Emphysema (H2EKM-NH1.E). Lumbar
degenerative disc disease.

## 2019-03-03 NOTE — Telephone Encounter (Signed)
Ok to refill x 2 , then should request from Dr Burr Medico  His Oncologist who is following him regularly

## 2019-03-04 ENCOUNTER — Encounter: Payer: Self-pay | Admitting: Vascular Surgery

## 2019-03-04 ENCOUNTER — Ambulatory Visit (INDEPENDENT_AMBULATORY_CARE_PROVIDER_SITE_OTHER): Payer: Medicaid Other | Admitting: Vascular Surgery

## 2019-03-04 ENCOUNTER — Other Ambulatory Visit: Payer: Self-pay

## 2019-03-04 ENCOUNTER — Ambulatory Visit (INDEPENDENT_AMBULATORY_CARE_PROVIDER_SITE_OTHER)
Admission: RE | Admit: 2019-03-04 | Discharge: 2019-03-04 | Disposition: A | Discharge status: Home / Self Care | Payer: Medicaid Other | Source: Ambulatory Visit | Attending: Vascular Surgery | Admitting: Vascular Surgery

## 2019-03-04 ENCOUNTER — Ambulatory Visit (HOSPITAL_COMMUNITY)
Admission: RE | Admit: 2019-03-04 | Discharge: 2019-03-04 | Disposition: A | Discharge status: Home / Self Care | Payer: Medicaid Other | Source: Ambulatory Visit | Attending: Vascular Surgery | Admitting: Vascular Surgery

## 2019-03-04 VITALS — BP 143/87 | HR 78 | Temp 97.3°F | Resp 16 | Ht 65.0 in | Wt 108.0 lb

## 2019-03-04 DIAGNOSIS — I70229 Atherosclerosis of native arteries of extremities with rest pain, unspecified extremity: Secondary | ICD-10-CM

## 2019-03-04 DIAGNOSIS — Z48812 Encounter for surgical aftercare following surgery on the circulatory system: Secondary | ICD-10-CM | POA: Diagnosis present

## 2019-03-04 DIAGNOSIS — I70213 Atherosclerosis of native arteries of extremities with intermittent claudication, bilateral legs: Secondary | ICD-10-CM | POA: Insufficient documentation

## 2019-03-04 DIAGNOSIS — I998 Other disorder of circulatory system: Secondary | ICD-10-CM | POA: Diagnosis present

## 2019-03-04 NOTE — Progress Notes (Signed)
Patient name: Keiler Noria MRN: [id number] DOB: 24-Jul-1955 Sex: male  REASON FOR VISIT: 72-month follow-up with surveillance for left iliac stent and left to right femorofemoral bypass  HPI: Samwise Wehri is a 63 y.o. male that presents for 65-month interval follow-up for ongoing surveillance of his left iliac stent and left to right femoral-femoral bypass in the setting of intermittent claudication with rest pain by Dr. Bridgett Larsson in 2018.  Patient reports no new issues in the last 6 months.  His breathing has been his biggest issue.  States when he walks he has no leg pain.  Has cut down from 2 packs of cigarettes a day to 10 cigarettes a day.  Trying to quit.    Past Medical History:  Diagnosis Date  . COPD (chronic obstructive pulmonary disease) ([place])   . Dyspnea   . PAD (peripheral artery disease) ([place])   . Stomach cancer ([place])   . Tobacco abuse   . Weight loss     Past Surgical History:  Procedure Laterality Date  . ABDOMINAL AORTOGRAM W/LOWER EXTREMITY N/A 05/03/2017   Procedure: ABDOMINAL AORTOGRAM W/LOWER EXTREMITY;  Surgeon: Conrad Pax, MD;  Location: [facility];  Service: Cardiovascular;  Laterality: N/A;  . BIOPSY  08/22/2018   Procedure: BIOPSY;  Surgeon: [name], MD;  Location: [facility];  Service: Endoscopy;;  . ESOPHAGOGASTRODUODENOSCOPY     05/24/17  . ESOPHAGOGASTRODUODENOSCOPY (EGD) WITH PROPOFOL N/A 05/24/2017   Procedure: ESOPHAGOGASTRODUODENOSCOPY (EGD) WITH PROPOFOL;  Surgeon: [name], MD;  [facility];  Service: Endoscopy;  Laterality: N/A;  . ESOPHAGOGASTRODUODENOSCOPY (EGD) WITH PROPOFOL N/A 08/22/2018   Procedure: ESOPHAGOGASTRODUODENOSCOPY (EGD) WITH PROPOFOL;  Surgeon: [name], MD;  [facility];  Service: Endoscopy;  Laterality: N/A;  . ESOPHAGOGASTRODUODENOSCOPY (EGD) WITH PROPOFOL N/A 11/28/2018   Procedure: ESOPHAGOGASTRODUODENOSCOPY (EGD) WITH PROPOFOL;  Surgeon: [name], MD;  [facility];  Service: Endoscopy;  Laterality: N/A;  . EUS N/A 06/07/2017   Procedure: UPPER ENDOSCOPIC ULTRASOUND (EUS) RADIAL;  Surgeon: [name], MD;  [facility];  Service: Endoscopy;  Laterality: N/A;  . FEMORAL-FEMORAL BYPASS GRAFT Bilateral 05/08/2017   Procedure: LEFT TO RIGHT BYPASS GRAFT FEMORAL-FEMORAL ARTERY;  Surgeon: Conrad St. Hedwig, MD;  Location: [facility];  Service: Vascular;  Laterality: Bilateral;  . IR FLUORO GUIDE PORT INSERTION RIGHT  07/05/2017  . IR US GUIDE VASC ACCESS RIGHT  07/05/2017  . LAPAROSCOPIC INSERTION GASTROSTOMY TUBE N/A 11/27/2017   Procedure: JEJUNOSTOMY TUBE PLACEMENT;  Surgeon: [name], MD;  Location: [facility];  Service: General;  Laterality: N/A;  . LAPAROSCOPIC PARTIAL GASTRECTOMY N/A 11/27/2017   Procedure: PARTIAL GASTRECTOMY;  Surgeon: [name], MD;  Location: [facility];  Service: General;  Laterality: N/A;  . LAPAROSCOPY N/A 11/27/2017   Procedure: LAPAROSCOPY DIAGNOSTIC;  Surgeon: [name], MD;  Location: [facility];  Service: General;  Laterality: N/A;  . NM MYOVIEW LTD  06/2017   LOW RISK. Small basal inferoseptal defect thought to be related to artifact (although cannot exclude prior infarct). Normal wall motion i in this area would suggest artifact not infarct. Otherwise no ischemia or infarction.  Marland Kitchen PERIPHERAL VASCULAR INTERVENTION Left 05/03/2017   Procedure: PERIPHERAL VASCULAR INTERVENTION;  Surgeon: Conrad [employer], MD;  Location: [facility];  Service: Cardiovascular;  Laterality: Left;  common iliac  . POLYPECTOMY  11/28/2018   Procedure: POLYPECTOMY;  Surgeon: [name], MD;  Location: [facility];  Service: Endoscopy;;  . TRANSTHORACIC ECHOCARDIOGRAM  06/26/2017   Normal LV size and function. EF 60-65%. No wall motion abnormalities. GR 1 DD. Mild LA dilation    Family History  Family history unknown: Yes    SOCIAL HISTORY: Social History   Tobacco Use  . Smoking status: Current Every Day Smoker     Packs/day: 0.50    Years: 50.00    Pack years: 25.00    Types: Cigarettes    Last attempt to quit: 11/2017    Years since quitting: 1.3  . Smokeless tobacco: Never Used  . Tobacco comment: 1/2 pk a day 02/04/19  Substance Use Topics  . Alcohol use: No    Alcohol/week: 0.0 standard drinks    No Known Allergies  Current Outpatient Medications  Medication Sig Dispense Refill  . budesonide-formoterol (SYMBICORT) 160-4.5 MCG/ACT inhaler Inhale 2 puffs into the lungs 2 (two) times daily. 1 Inhaler 6  . buPROPion (ZYBAN) 150 MG 12 hr tablet Take 150 mg by mouth daily.    . chlorhexidine (PERIDEX) 0.12 % solution Use as directed 15 mLs in the mouth or throat 2 (two) times daily.    Marland Kitchen escitalopram (LEXAPRO) 10 MG tablet Take 1 tablet (10 mg total) by mouth at bedtime. 30 tablet 3  . ipratropium-albuterol (DUONEB) 0.5-2.5 (3) MG/3ML SOLN USE 1 AMPULE IN NEBULIZER 4 TIMES DAILY (Patient taking differently: Inhale 3 mLs into the lungs 4 (four) times daily. ) 360 mL 2  . megestrol (MEGACE) 40 MG/ML suspension TAKE 5 ML BY MOUTH  4 TIMES DAILY 240 mL 2  . mirtazapine (REMERON) 7.5 MG tablet Take 7.5 mg by mouth at bedtime.    . potassium chloride SA (K-DUR) 20 MEQ tablet Take 1 tablet (20 mEq total) by mouth daily. 7 tablet 0  . Tiotropium Bromide Monohydrate (SPIRIVA RESPIMAT) 2.5 MCG/ACT AERS Inhale 2 puffs into the lungs daily.     No current facility-administered medications for this visit.     REVIEW OF SYSTEMS:  [X]  denotes positive finding, [ ]  denotes negative finding Cardiac  Comments:  Chest pain or chest pressure:    Shortness of breath upon exertion:    Short of breath when lying flat:    Irregular heart rhythm:        Vascular    Pain in calf, thigh, or hip brought on by ambulation:    Pain in feet at night that wakes you up from your sleep:     Blood clot in your veins:    Leg swelling:         Pulmonary    Oxygen at home:    Productive cough:     Wheezing:          Neurologic    Sudden weakness in arms or legs:     Sudden numbness in arms or legs:     Sudden onset of difficulty speaking or slurred speech:    Temporary loss of vision in one eye:     Problems with dizziness:         Gastrointestinal    Blood in stool:     Vomited blood:         Genitourinary    Burning when urinating:     Blood in urine:        Psychiatric    Major depression:         Hematologic    Bleeding problems:    Problems with blood clotting too easily:        Skin  Rashes or ulcers:        Constitutional    Fever or chills:      PHYSICAL EXAM: Vitals:   03/04/19 1002  BP: (!) 143/87  Pulse: 78  Resp: 16  Temp: (!) 97.3 F (36.3 C)  TempSrc: Temporal  SpO2: 96%  Weight: 108 lb (49 kg)  Height: 5\' 5"  (1.651 m)    GENERAL: The patient is a well-nourished male, in no acute distress. The vital signs are documented above. CARDIAC: There is a regular rate and rhythm.  VASCULAR:  2+ femoral pulse in bilateral groins with well healed incisions Palpable pulse in femoral femoral bypass graft Palpable PT pulse bilateral lower extremities , no tissue loss PULMONARY: There is good air exchange bilaterally without wheezing or rales. ABDOMEN: Soft and non-tender with normal pitched bowel sounds. Well healed midline incision. MUSCULOSKELETAL: There are no major deformities or cyanosis.   DATA:     I independently reviewed his noninvasive imaging that shows patent left to right femoral-femoral bypass.  There is no evidence of left iliac stent stenosis.  Unfortunately continues to have progression of disease at both the inflow and outflow of his femoral-femoral bypass.  Velocities in the left groin now 340 and on the right groin 316 consistent with 50 to 70% stenosis based on B-mode imaging.  Triphasic waveforms at the ankles with ABI 1.0 bilaterally  Assessment/Plan:  63 year old male presents for ongoing interval surveillance for left common iliac stent  with a left to right femoral-femoral bypass for claudication and intermittent rest pain by Dr. Bridgett Larsson.  No new issues over the last 6 months.  He is trying to cut down on his smoking and is down to 10 cigarettes a day from 2 packs a day.  He has progression of disease both at the inflow and outflow of his femorofemoral bypass consistent with 50 to 70% stenosis.  Discussed that a lot of this is likely related to his underlying tobacco abuse.  Given that he has palpable pulses in his graft as well as both groins and both feet and is asymptomatic I do not see any role for intervention at this time.  We will plan for ongoing surveillance again in 6 months.  Marty Heck, MD [facility] Office: [phone] Pager: [place]

## 2019-04-11 ENCOUNTER — Inpatient Hospital Stay: Payer: Medicaid Other | Attending: Hematology

## 2019-04-11 ENCOUNTER — Other Ambulatory Visit: Payer: Self-pay

## 2019-04-11 DIAGNOSIS — Z452 Encounter for adjustment and management of vascular access device: Secondary | ICD-10-CM | POA: Diagnosis present

## 2019-04-11 DIAGNOSIS — C169 Malignant neoplasm of stomach, unspecified: Secondary | ICD-10-CM | POA: Diagnosis present

## 2019-04-11 DIAGNOSIS — Z95828 Presence of other vascular implants and grafts: Secondary | ICD-10-CM

## 2019-04-11 MED ORDER — HEPARIN SOD (PORK) LOCK FLUSH 100 UNIT/ML IV SOLN
500.0000 [IU] | Freq: Once | INTRAVENOUS | Status: AC
  Administered 2019-04-11: 09:00:00 500 [IU]
  Filled 2019-04-11: qty 5

## 2019-04-11 MED ORDER — SODIUM CHLORIDE 0.9% FLUSH
10.0000 mL | Freq: Once | INTRAVENOUS | Status: AC
  Administered 2019-04-11: 09:00:00 10 mL
  Filled 2019-04-11: qty 10

## 2019-06-06 ENCOUNTER — Other Ambulatory Visit: Payer: Self-pay

## 2019-06-06 ENCOUNTER — Inpatient Hospital Stay: Payer: Medicaid Other | Attending: Hematology

## 2019-06-06 DIAGNOSIS — C169 Malignant neoplasm of stomach, unspecified: Secondary | ICD-10-CM | POA: Diagnosis present

## 2019-06-06 DIAGNOSIS — Z95828 Presence of other vascular implants and grafts: Secondary | ICD-10-CM

## 2019-06-06 MED ORDER — HEPARIN SOD (PORK) LOCK FLUSH 100 UNIT/ML IV SOLN
500.0000 [IU] | Freq: Once | INTRAVENOUS | Status: DC
  Filled 2019-06-06: qty 5

## 2019-06-06 MED ORDER — SODIUM CHLORIDE 0.9% FLUSH
10.0000 mL | Freq: Once | INTRAVENOUS | Status: DC
  Filled 2019-06-06: qty 10

## 2019-06-09 ENCOUNTER — Telehealth: Payer: Self-pay

## 2019-06-09 NOTE — Telephone Encounter (Signed)
-----   Message from Dalene Seltzer, RN sent at 12/03/2018  9:03 AM EDT ----- Regarding: Schedule EGD Schedule 6 month repeat EGD

## 2019-06-10 NOTE — Telephone Encounter (Signed)
Message left for pt to return call using interpreter services.

## 2019-06-11 NOTE — Telephone Encounter (Signed)
Left message for the pt to return call to set up EGD at [facility] with Dr [name].  Message left with interpreter services

## 2019-06-16 ENCOUNTER — Other Ambulatory Visit: Payer: Self-pay

## 2019-06-16 DIAGNOSIS — Z85028 Personal history of other malignant neoplasm of stomach: Secondary | ICD-10-CM

## 2019-06-16 NOTE — Telephone Encounter (Signed)
EGD scheduled 12/17, pt instructed and medications reviewed.  Patient instructions mailed to home.  Patient to call with any questions or concerns.  Covid testing also advised for 06/23/19.  The pt has been advised of the information and verbalized understanding.

## 2019-06-23 ENCOUNTER — Other Ambulatory Visit (HOSPITAL_COMMUNITY)
Admission: RE | Admit: 2019-06-23 | Discharge: 2019-06-23 | Disposition: A | Discharge status: Home / Self Care | Payer: Medicaid Other | Source: Ambulatory Visit | Attending: Gastroenterology | Admitting: Gastroenterology

## 2019-06-23 DIAGNOSIS — Z20828 Contact with and (suspected) exposure to other viral communicable diseases: Secondary | ICD-10-CM | POA: Insufficient documentation

## 2019-06-23 DIAGNOSIS — Z01812 Encounter for preprocedural laboratory examination: Secondary | ICD-10-CM | POA: Insufficient documentation

## 2019-06-24 ENCOUNTER — Telehealth: Payer: Self-pay | Admitting: Gastroenterology

## 2019-06-24 LAB — NOVEL CORONAVIRUS, NAA (HOSP ORDER, SEND-OUT TO REF LAB; TAT 18-24 HRS): SARS-CoV-2, NAA: DETECTED — AB

## 2019-06-24 NOTE — Telephone Encounter (Signed)
This patient was found to be positive for COVID-19 on routine pre-procedure testing.   PLease call Bobby Caldwell to let him know his covid test was +.  Please cancel his upcoming EGD ([facility] on Thursday).  Please advise hiim to start isolating himself for 10 days from now. He also needs to call his PCP to discuss further recommendations.   OK to rebook him for the same [facility] EGD in 6-7 weeks from now.    Thanks

## 2019-06-25 ENCOUNTER — Telehealth: Payer: Self-pay | Admitting: Pulmonary Disease

## 2019-06-25 ENCOUNTER — Telehealth: Payer: Self-pay | Admitting: Nurse Practitioner

## 2019-06-25 NOTE — Telephone Encounter (Signed)
Called to Discuss with patient's daughter (contact listed) about Covid symptoms and the use of bamlanivimab, a monoclonal antibody infusion for those with mild to moderate Covid symptoms and at a high risk of hospitalization.     Pt is qualified for this infusion at the [facility] infusion center due to co-morbid conditions and/or a member of an at-risk group.     Patient Active Problem List   Diagnosis Date Noted  . Gastric mass   . Dysphagia   . Chronic hypoxemic respiratory failure ([place]) 03/13/2018  . S/P gastric surgery 01/22/2018  . Nausea 12/20/2017  . 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 S/P vascular surgery 11/15/2017  . Pneumonia 09/10/2017  . Acute on chronic respiratory failure with hypoxia ([place]) 09/10/2017  . Exercise hypoxemia 09/04/2017  . Moderate protein-calorie malnutrition ([place]) 07/24/2017  . Port-A-Cath in place 07/06/2017  . DOE (dyspnea on exertion) 06/12/2017  . Atypical chest pain 06/12/2017  . Essential hypertension 06/12/2017  . Preop cardiovascular exam 06/12/2017  . Gastric adenocarcinoma ([place]) 06/04/2017  . Gastritis and gastroduodenitis   . Atherosclerosis of native arteries of extremity with intermittent claudication ([place]) 05/03/2017  . Critical lower limb ischemia 05/03/2017  . Abnormal CT of the chest 08/02/2015  . Underweight 03/30/2015  . COPD (chronic obstructive pulmonary disease) with emphysema ([place]) 02/16/2015  . Weight loss 02/16/2015  . Cigarette smoker 02/16/2015    Patient is currently feeling much improved. Patient declined treatment at this time. Symptoms tier reviewed as well as criteria for ending isolation. Preventative practices reviewed. Patient verbalized understanding.   Patient advised to call back if he decides that he does want to get infusion. Callback number to the infusion center given. Patient advised to go to Urgent care or ED with severe symptoms.

## 2019-06-25 NOTE — Telephone Encounter (Signed)
Called translation services [phone]) Rep id [phone] had to leave a message for the patient to ask the patient to call back with the name of the medical supply company who provided the O2. And if he has already contacted the company for assistance.  The last referral from our office was back in 2019 to [facility]. Called [employer] spoke with Janett Billow and she stated they have not been in communication with the patient since July 2019. He has not placed any calls to them for any equipment or additional tanks to be delivered since that time.  Janett Billow said that if the patient is using O2 they need to know what DME has been providing the tanks. [employer] has not sent anything to the patient. And if using a new DME, they need to be made aware in order to pick their equipment up.  Message kept in Triage, waiting for patient response.

## 2019-06-25 NOTE — Telephone Encounter (Signed)
Patient will also need to be set up for a video visit (if possible) or telephone visit in order to be follow up on due to covid.  Test noted in chart dated 06/23/19.

## 2019-06-26 NOTE — Telephone Encounter (Signed)
lmtcb X2 for pt through interpreter.

## 2019-06-30 NOTE — Telephone Encounter (Signed)
I called pt to see if he was available to talk but the phone rang busy X 3. Will try again later.

## 2019-07-01 NOTE — Telephone Encounter (Signed)
Attempted to call pt using interpreter services but line went straight to VM. Interpreter left message for pt to return call. Due to multiple attempts trying to reach pt and unable to do so, per triage protocol encounter will be closed.

## 2019-07-28 ENCOUNTER — Other Ambulatory Visit (HOSPITAL_COMMUNITY): Payer: Medicaid Other

## 2019-07-30 NOTE — Anesthesia Preprocedure Evaluation (Signed)
Anesthesia Evaluation  Patient identified by MRN, date of birth, ID band Patient awake    Reviewed: Allergy & Precautions, NPO status , Patient's Chart, lab work & pertinent test results  History of Anesthesia Complications Negative for: history of anesthetic complications  Airway Mallampati: II  TM Distance: >3 FB Neck ROM: Full    Dental  (+) Teeth Intact, Caps, Dental Advisory Given Multiple caps:   Pulmonary shortness of breath, pneumonia, COPD, Current Smoker, former smoker,   + cough noted   + decreased breath sounds      Cardiovascular hypertension, + Peripheral Vascular Disease and + DOE   Rhythm:Regular Rate:Normal  Echo 2018 Left ventricle: The cavity size was normal. Systolic function was normal. The estimated ejection fraction was in the range of 60% to 65%. Wall motion was normal; there were no regional wall  motion abnormalities. Doppler parameters are consistent with abnormal left ventricular relaxation (grade 1 diastolic dysfunction). - Left atrium: The atrium was mildly dilated.   Neuro/Psych negative neurological ROS     GI/Hepatic Gastric ca    Endo/Other  negative endocrine ROS  Renal/GU negative Renal ROS     Musculoskeletal negative musculoskeletal ROS (+)   Abdominal   Peds  Hematology negative hematology ROS (+)   Anesthesia Other Findings   Reproductive/Obstetrics                             Anesthesia Physical  Anesthesia Plan  ASA: III  Anesthesia Plan: MAC   Post-op Pain Management:    Induction: Intravenous  PONV Risk Score and Plan: 2 and Ondansetron and Propofol infusion  Airway Management Planned:   Additional Equipment:   Intra-op Plan:   Post-operative Plan:   Informed Consent: I have reviewed the patients History and Physical, chart, labs and discussed the procedure including the risks, benefits and alternatives for the proposed  anesthesia with the patient or authorized representative who has indicated his/her understanding and acceptance.     Dental advisory given  Plan Discussed with: CRNA  Anesthesia Plan Comments:         Anesthesia Quick Evaluation

## 2019-07-31 ENCOUNTER — Ambulatory Visit (HOSPITAL_COMMUNITY)
Admission: RE | Admit: 2019-07-31 | Discharge: 2019-07-31 | Disposition: A | Discharge status: Home / Self Care | Payer: Medicaid Other | Source: Ambulatory Visit | Attending: Gastroenterology | Admitting: Gastroenterology

## 2019-07-31 ENCOUNTER — Ambulatory Visit (HOSPITAL_COMMUNITY): Payer: Medicaid Other | Admitting: Anesthesiology

## 2019-07-31 ENCOUNTER — Telehealth: Payer: Self-pay

## 2019-07-31 ENCOUNTER — Encounter (HOSPITAL_COMMUNITY): Payer: Self-pay | Admitting: Gastroenterology

## 2019-07-31 ENCOUNTER — Other Ambulatory Visit: Payer: Self-pay

## 2019-07-31 ENCOUNTER — Other Ambulatory Visit: Payer: Self-pay | Admitting: Emergency Medicine

## 2019-07-31 ENCOUNTER — Encounter (HOSPITAL_COMMUNITY)
Admission: RE | Disposition: A | Discharge status: Home / Self Care | Payer: Self-pay | Source: Ambulatory Visit | Attending: Gastroenterology

## 2019-07-31 DIAGNOSIS — Z85028 Personal history of other malignant neoplasm of stomach: Secondary | ICD-10-CM

## 2019-07-31 DIAGNOSIS — Z9221 Personal history of antineoplastic chemotherapy: Secondary | ICD-10-CM | POA: Insufficient documentation

## 2019-07-31 DIAGNOSIS — I739 Peripheral vascular disease, unspecified: Secondary | ICD-10-CM | POA: Insufficient documentation

## 2019-07-31 DIAGNOSIS — I10 Essential (primary) hypertension: Secondary | ICD-10-CM | POA: Diagnosis not present

## 2019-07-31 DIAGNOSIS — Z903 Acquired absence of stomach [part of]: Secondary | ICD-10-CM | POA: Insufficient documentation

## 2019-07-31 DIAGNOSIS — B3781 Candidal esophagitis: Secondary | ICD-10-CM | POA: Diagnosis not present

## 2019-07-31 DIAGNOSIS — Z08 Encounter for follow-up examination after completed treatment for malignant neoplasm: Secondary | ICD-10-CM | POA: Insufficient documentation

## 2019-07-31 DIAGNOSIS — F17210 Nicotine dependence, cigarettes, uncomplicated: Secondary | ICD-10-CM | POA: Insufficient documentation

## 2019-07-31 DIAGNOSIS — K2970 Gastritis, unspecified, without bleeding: Secondary | ICD-10-CM

## 2019-07-31 DIAGNOSIS — J449 Chronic obstructive pulmonary disease, unspecified: Secondary | ICD-10-CM | POA: Diagnosis not present

## 2019-07-31 DIAGNOSIS — C160 Malignant neoplasm of cardia: Secondary | ICD-10-CM

## 2019-07-31 DIAGNOSIS — K3189 Other diseases of stomach and duodenum: Secondary | ICD-10-CM | POA: Diagnosis not present

## 2019-07-31 HISTORY — PX: BIOPSY: SHX5522

## 2019-07-31 HISTORY — PX: ESOPHAGOGASTRODUODENOSCOPY (EGD) WITH PROPOFOL: SHX5813

## 2019-07-31 SURGERY — ESOPHAGOGASTRODUODENOSCOPY (EGD) WITH PROPOFOL
Anesthesia: Monitor Anesthesia Care

## 2019-07-31 MED ORDER — PROPOFOL 500 MG/50ML IV EMUL
INTRAVENOUS | Status: AC
  Filled 2019-07-31: qty 50

## 2019-07-31 MED ORDER — LACTATED RINGERS IV SOLN
INTRAVENOUS | Status: DC
  Administered 2019-07-31: 1000 mL via INTRAVENOUS

## 2019-07-31 MED ORDER — FLUCONAZOLE 100 MG PO TABS: 100.0000 mg | ORAL_TABLET | Freq: Every day | ORAL | 0 refills | Status: AC

## 2019-07-31 MED ORDER — SODIUM CHLORIDE 0.9 % IV SOLN: INTRAVENOUS | Status: DC

## 2019-07-31 MED ORDER — ONDANSETRON HCL 4 MG/2ML IJ SOLN
INTRAMUSCULAR | Status: DC | PRN
  Administered 2019-07-31: 4 mg via INTRAVENOUS

## 2019-07-31 MED ORDER — PROPOFOL 500 MG/50ML IV EMUL
INTRAVENOUS | Status: AC
  Filled 2019-07-31: qty 150

## 2019-07-31 MED ORDER — PROPOFOL 500 MG/50ML IV EMUL
INTRAVENOUS | Status: DC | PRN
  Administered 2019-07-31: 100 ug/kg/min via INTRAVENOUS

## 2019-07-31 MED ORDER — PROPOFOL 10 MG/ML IV BOLUS
INTRAVENOUS | Status: DC | PRN
  Administered 2019-07-31: 20 mg via INTRAVENOUS

## 2019-07-31 SURGICAL SUPPLY — 14 items

## 2019-07-31 NOTE — Anesthesia Postprocedure Evaluation (Signed)
Anesthesia Post Note  Patient: Bobby Caldwell  Procedure(s) Performed: ESOPHAGOGASTRODUODENOSCOPY (EGD) WITH PROPOFOL (N/A ) BIOPSY     Patient location during evaluation: PACU Anesthesia Type: MAC Level of consciousness: awake and alert Pain management: pain level controlled Vital Signs Assessment: post-procedure vital signs reviewed and stable Respiratory status: spontaneous breathing Cardiovascular status: stable Anesthetic complications: no    Last Vitals:  Vitals:   07/31/19 0758 07/31/19 0825  BP: 122/79   Pulse: 84   Resp: (!) 25   Temp: 36.7 C 36.8 C  SpO2: 100%     Last Pain:  Vitals:   07/31/19 0825  TempSrc: Oral  PainSc: 0-No pain                 Nolon Nations

## 2019-07-31 NOTE — Anesthesia Procedure Notes (Signed)
Procedure Name: MAC Date/Time: 07/31/2019 7:40 AM Performed by: Lollie Sails, CRNA Pre-anesthesia Checklist: Patient identified, Emergency Drugs available, Suction available, Patient being monitored and Timeout performed Oxygen Delivery Method: Nasal cannula

## 2019-07-31 NOTE — Transfer of Care (Signed)
Immediate Anesthesia Transfer of Care Note  Patient: Bobby Caldwell  Procedure(s) Performed: ESOPHAGOGASTRODUODENOSCOPY (EGD) WITH PROPOFOL (N/A ) BIOPSY  Patient Location: PACU and Endoscopy Unit  Anesthesia Type:MAC  Level of Consciousness: drowsy and responds to stimulation  Airway & Oxygen Therapy: Patient Spontanous Breathing and Patient connected to nasal cannula oxygen  Post-op Assessment: Report given to RN and Post -op Vital signs reviewed and stable  Post vital signs: Reviewed and stable  Last Vitals:  Vitals Value Taken Time  BP 122/79 07/31/19 0756  Temp    Pulse 85 07/31/19 0756  Resp 25 07/31/19 0756  SpO2 100 % 07/31/19 0756  Vitals shown include unvalidated device data.  Last Pain:  Vitals:   07/31/19 0658  TempSrc: Oral  PainSc: 0-No pain         Complications: No apparent anesthesia complications

## 2019-07-31 NOTE — Discharge Instructions (Signed)
YOU HAD AN ENDOSCOPIC PROCEDURE TODAY: Refer to the procedure report and other information in the discharge instructions given to you for any specific questions about what was found during the examination. If this information does not answer your questions, please call [place] office at [phone] to clarify.   YOU SHOULD EXPECT: Some feelings of bloating in the abdomen. Passage of more gas than usual. Walking can help get rid of the air that was put into your GI tract during the procedure and reduce the bloating. If you had a lower endoscopy (such as a colonoscopy or flexible sigmoidoscopy) you may notice spotting of blood in your stool or on the toilet paper. Some abdominal soreness may be present for a day or two, also.  DIET: Your first meal following the procedure should be a light meal and then it is ok to progress to your normal diet. A half-sandwich or bowl of soup is an example of a good first meal. Heavy or fried foods are harder to digest and may make you feel nauseous or bloated. Drink plenty of fluids but you should avoid alcoholic beverages for 24 hours. If you had a esophageal dilation, please see attached instructions for diet.    ACTIVITY: Your care partner should take you home directly after the procedure. You should plan to take it easy, moving slowly for the rest of the day. You can resume normal activity the day after the procedure however YOU SHOULD NOT DRIVE, use power tools, machinery or perform tasks that involve climbing or major physical exertion for 24 hours (because of the sedation medicines used during the test).   SYMPTOMS TO REPORT IMMEDIATELY: A gastroenterologist can be reached at any hour. Please call [phone]  for any of the following symptoms:   Following upper endoscopy (EGD, EUS, ERCP, esophageal dilation) Vomiting of blood or coffee ground material  New, significant abdominal pain  New, significant chest pain or pain under the shoulder blades  Painful or  persistently difficult swallowing  New shortness of breath  Black, tarry-looking or red, bloody stools  FOLLOW UP:  If any biopsies were taken you will be contacted by phone or by letter within the next 1-3 weeks. Call [phone]  if you have not heard about the biopsies in 3 weeks.  Please also call with any specific questions about appointments or follow up tests.  

## 2019-07-31 NOTE — H&P (Signed)
HPI: This is a 64 yo man with history of gastric cancer  Here for follow up EGD  Chief complaint is gastric cancer  ROS: complete GI ROS as described in HPI, all other review negative.  Constitutional:  No unintentional weight loss   Past Medical History:  Diagnosis Date  . COPD (chronic obstructive pulmonary disease) ([name])   . Dyspnea   . PAD (peripheral artery disease) ([place])   . Stomach cancer ([place])   . Tobacco abuse   . Weight loss     Past Surgical History:  Procedure Laterality Date  . ABDOMINAL AORTOGRAM W/LOWER EXTREMITY N/A 05/03/2017   Procedure: ABDOMINAL AORTOGRAM W/LOWER EXTREMITY;  Surgeon: Conrad Killian, MD;  Location: [facility];  Service: Cardiovascular;  Laterality: N/A;  . BIOPSY  08/22/2018   Procedure: BIOPSY;  Surgeon: [name], MD;  [facility];  Service: Endoscopy;;  . ESOPHAGOGASTRODUODENOSCOPY     05/24/17  . ESOPHAGOGASTRODUODENOSCOPY (EGD) WITH PROPOFOL N/A 05/24/2017   Procedure: ESOPHAGOGASTRODUODENOSCOPY (EGD) WITH PROPOFOL;  Surgeon: [name], MD;  [facility];  Service: Endoscopy;  Laterality: N/A;  . ESOPHAGOGASTRODUODENOSCOPY (EGD) WITH PROPOFOL N/A 08/22/2018   Procedure: ESOPHAGOGASTRODUODENOSCOPY (EGD) WITH PROPOFOL;  Surgeon: [name], MD;  [facility];  Service: Endoscopy;  Laterality: N/A;  . ESOPHAGOGASTRODUODENOSCOPY (EGD) WITH PROPOFOL N/A 11/28/2018   Procedure: ESOPHAGOGASTRODUODENOSCOPY (EGD) WITH PROPOFOL;  Surgeon: [name], MD;  [facility];  Service: Endoscopy;  Laterality: N/A;  . EUS N/A 06/07/2017   Procedure: UPPER ENDOSCOPIC ULTRASOUND (EUS) RADIAL;  Surgeon: [name], MD;  [facility];  Service: Endoscopy;  Laterality: N/A;  . FEMORAL-FEMORAL BYPASS GRAFT Bilateral 05/08/2017   Procedure: LEFT TO RIGHT BYPASS GRAFT FEMORAL-FEMORAL ARTERY;  Surgeon: Conrad [place], MD;  Location: [facility];  Service: Vascular;  Laterality:  Bilateral;  . IR FLUORO GUIDE PORT INSERTION RIGHT  07/05/2017  . IR US GUIDE VASC ACCESS RIGHT  07/05/2017  . LAPAROSCOPIC INSERTION GASTROSTOMY TUBE N/A 11/27/2017   Procedure: JEJUNOSTOMY TUBE PLACEMENT;  Surgeon: [name], MD;  Location: [facility];  Service: General;  Laterality: N/A;  . LAPAROSCOPIC PARTIAL GASTRECTOMY N/A 11/27/2017   Procedure: PARTIAL GASTRECTOMY;  Surgeon: [name], MD;  Location: [place];  Service: General;  Laterality: N/A;  . LAPAROSCOPY N/A 11/27/2017   Procedure: LAPAROSCOPY DIAGNOSTIC;  Surgeon: [name], MD;  Location: [place];  Service: General;  Laterality: N/A;  . NM MYOVIEW LTD  06/2017   LOW RISK. Small basal inferoseptal defect thought to be related to artifact (although cannot exclude prior infarct). Normal wall motion i in this area would suggest artifact not infarct. Otherwise no ischemia or infarction.  Marland Kitchen PERIPHERAL VASCULAR INTERVENTION Left 05/03/2017   Procedure: PERIPHERAL VASCULAR INTERVENTION;  Surgeon: Conrad [place], MD;  Location: [facility];  Service: Cardiovascular;  Laterality: Left;  common iliac  . POLYPECTOMY  11/28/2018   Procedure: POLYPECTOMY;  Surgeon: [name], MD;  [facility];  Service: Endoscopy;;  . TRANSTHORACIC ECHOCARDIOGRAM  06/26/2017   Normal LV size and function. EF 60-65%. No wall motion abnormalities. GR 1 DD. Mild LA dilation    Current Facility-Administered Medications  Medication Dose Route Frequency Provider Last Rate Last Admin  . 0.9 %  sodium chloride infusion   Intravenous Continuous [name], MD      . lactated ringers infusion   Intravenous Continuous [name], MD        Allergies  as of 06/16/2019  . (No Known Allergies)    Family History  Family history unknown: Yes    Social History   Socioeconomic History  . Marital status: Married    Spouse name: Not on file  . Number of children: 4  . Years of education: Not on file  . Highest education level:  Not on file  Occupational History  . Not on file  Tobacco Use  . Smoking status: Current Every Day Smoker    Packs/day: 0.50    Years: 50.00    Pack years: 25.00    Types: Cigarettes    Last attempt to quit: 11/2017    Years since quitting: 1.7  . Smokeless tobacco: Never Used  . Tobacco comment: 1/2 pk a day 02/04/19  Substance and Sexual Activity  . Alcohol use: No    Alcohol/week: 0.0 standard drinks  . Drug use: No  . Sexual activity: Yes  Other Topics Concern  . Not on file  Social History Narrative  . Not on file   Social Determinants of Health   Financial Resource Strain:   . Difficulty of Paying Living Expenses: Not on file  Food Insecurity:   . Worried About Charity fundraiser in the Last Year: Not on file  . Ran Out of Food in the Last Year: Not on file  Transportation Needs:   . Lack of Transportation (Medical): Not on file  . Lack of Transportation (Non-Medical): Not on file  Physical Activity:   . Days of Exercise per Week: Not on file  . Minutes of Exercise per Session: Not on file  Stress:   . Feeling of Stress : Not on file  Social Connections:   . Frequency of Communication with Friends and Family: Not on file  . Frequency of Social Gatherings with Friends and Family: Not on file  . Attends Religious Services: Not on file  . Active Member of Clubs or Organizations: Not on file  . Attends Archivist Meetings: Not on file  . Marital Status: Not on file  Intimate Partner Violence:   . Fear of Current or Ex-Partner: Not on file  . Emotionally Abused: Not on file  . Physically Abused: Not on file  . Sexually Abused: Not on file     Physical Exam: BP (!) 167/103   Pulse 96   Temp 97.9 F (36.6 C) (Oral)   Resp 20   Ht 5\' 5"  (1.651 m)   Wt 44.5 kg   SpO2 96%   BMI 16.31 kg/m  Constitutional: generally well-appearing Psychiatric: alert and oriented x3 Abdomen: soft, nontender, nondistended, no obvious ascites, no peritoneal signs,  normal bowel sounds No peripheral edema noted in lower extremities  Assessment and plan: 64 y.o. male with gastric cancer  For EGD today  Please see the "Patient Instructions" section for addition details about the plan.  Owens Loffler, MD [facility] 07/31/2019, 7:14 AM

## 2019-07-31 NOTE — Telephone Encounter (Signed)
Patients daughter calls for advice.  She states his oxygen level is dropping even with using oxygen, increased weakness.  She is asking for advice on what she should do.

## 2019-07-31 NOTE — Op Note (Signed)
[facility] Patient Name: Bobby Caldwell Procedure Date: 07/31/2019 MRN: [id number] Attending MD: [name] , MD Date of Birth: 28-Oct-1955 CSN: [id number] Age: 64 Admit Type: Outpatient Procedure:                Upper GI endoscopy Indications:              Distal gastrectomy following neoadjuvant chemo                            11/2017 for Stage I gastric adenocarcinoma; 07/2017                            CT chest/abd/pelvis shows no sign of recurrence;                            EGD 08/2018 found adenomatous gastric polyp at the                            gastr-enteric anastomosis; EGD 11/2018 removed the                            polyp, LGD only. Providers:                [name], MD, Debi Mays, RN, Corie Chiquito,                            Technician, Lina Sar, Technician, Edman Circle.                            Zenia Resides CRNA, CRNA Referring MD:              Medicines:                Monitored Anesthesia Care Complications:            No immediate complications. Estimated blood loss:                            None. Estimated Blood Loss:     Estimated blood loss: none. Procedure:                Pre-Anesthesia Assessment:                           - Prior to the procedure, a History and Physical                            was performed, and patient medications and                            allergies were reviewed. The patient's tolerance of                            previous anesthesia was also reviewed. The risks                            and benefits of the procedure  and the sedation                            options and risks were discussed with the patient.                            All questions were answered, and informed consent                            was obtained. Prior Anticoagulants: The patient has                            taken no previous anticoagulant or antiplatelet                            agents. ASA Grade Assessment: III - A  patient with                            severe systemic disease. After reviewing the risks                            and benefits, the patient was deemed in                            satisfactory condition to undergo the procedure.                           After obtaining informed consent, the endoscope was                            passed under direct vision. Throughout the                            procedure, the patient's blood pressure, pulse, and                            oxygen saturations were monitored continuously. The                            [id number]-[id number]) Olympus gastroscope was                            introduced through the mouth, and advanced to the                            afferent and efferent jejunal loops. The upper GI                            endoscopy was accomplished without difficulty. The                            patient tolerated the procedure well. Scope In: Scope Out: Findings:      Typical Bilroth II anatomy. The GJ anastomosis was slightly inflammed,  but not overtly neoplastic appearing. No clear recurrent or residual       polyps. The anastomosis was sampled at the sight of most significant       appearing inflammation (1-2cm section, see images).      Afferent and efferent limbs were intubated briefly and appeared normal.      Mild candida esophagitis, not sampled.      The exam was otherwise without abnormality. Impression:               - Typical Bilroth II anatomy. The GJ anastomosis                            was slightly inflammed, but not overtly neoplastic                            appearing. No clear recurrent or residual polyps.                            The anastomosis was sampled at the sight of most                            significant appearing inflammation (1-2cm section,                            see images).                           - Afferent and efferent limbs were intubated                             briefly and appeared normal.                           - Mild candida esophagitis, not sampled. Will start                            empiric diflucan course (100mg  daily for 10 days)                           - The examination was otherwise normal. Moderate Sedation:      Not Applicable - Patient had care per Anesthesia. Recommendation:           - Await pathology results. Procedure Code(s):        --- Professional ---                           [phone], Esophagogastroduodenoscopy, flexible,                            transoral; with biopsy, single or multiple Diagnosis Code(s):        --- Professional ---                           K29.70, Gastritis, unspecified, without bleeding  R10.13, Epigastric pain CPT copyright 2019 American Medical Association. All rights reserved. The codes documented in this report are preliminary and upon coder review may  be revised to meet current compliance requirements. [name], MD 07/31/2019 8:07:52 AM This report has been signed electronically. Number of Addenda: 0

## 2019-07-31 NOTE — Telephone Encounter (Signed)
I called and spoke to his daughter. She reports gradual progressive fatigue and weakness. Any exertion causes dyspnea and increased work of breathing. O2 sat ranges 85-97% on 2-2.5 L O2. He had EGD today and recovered well. His cough is at baseline. Patient spoke to me for a minute and he had no conversational dyspnea, no cough. He was COVID positive last month, required no treatment. This sounds like a gradual process rather than acute change. He does not appear to be in respiratory distress. He has flush appt tomorrow at [facility], will ask Lucianne Lei to see him. His daughter understands and will accompany him tomorrow.  Cira Rue, NP  07/31/19

## 2019-08-01 ENCOUNTER — Inpatient Hospital Stay: Payer: Medicaid Other | Attending: Hematology

## 2019-08-01 ENCOUNTER — Other Ambulatory Visit: Payer: Self-pay

## 2019-08-01 ENCOUNTER — Inpatient Hospital Stay: Payer: Medicaid Other

## 2019-08-01 ENCOUNTER — Inpatient Hospital Stay (HOSPITAL_BASED_OUTPATIENT_CLINIC_OR_DEPARTMENT_OTHER): Payer: Medicaid Other | Admitting: Medical

## 2019-08-01 VITALS — BP 150/86 | HR 96 | Temp 98.3°F | Resp 20 | Ht 65.0 in | Wt 101.8 lb

## 2019-08-01 DIAGNOSIS — R059 Cough, unspecified: Secondary | ICD-10-CM

## 2019-08-01 DIAGNOSIS — C160 Malignant neoplasm of cardia: Secondary | ICD-10-CM

## 2019-08-01 DIAGNOSIS — Z85028 Personal history of other malignant neoplasm of stomach: Secondary | ICD-10-CM | POA: Insufficient documentation

## 2019-08-01 DIAGNOSIS — F17210 Nicotine dependence, cigarettes, uncomplicated: Secondary | ICD-10-CM | POA: Insufficient documentation

## 2019-08-01 DIAGNOSIS — Z95828 Presence of other vascular implants and grafts: Secondary | ICD-10-CM

## 2019-08-01 DIAGNOSIS — J9611 Chronic respiratory failure with hypoxia: Secondary | ICD-10-CM | POA: Diagnosis not present

## 2019-08-01 DIAGNOSIS — C169 Malignant neoplasm of stomach, unspecified: Secondary | ICD-10-CM | POA: Diagnosis not present

## 2019-08-01 DIAGNOSIS — E860 Dehydration: Secondary | ICD-10-CM

## 2019-08-01 DIAGNOSIS — R05 Cough: Secondary | ICD-10-CM

## 2019-08-01 LAB — CBC WITH DIFFERENTIAL (CANCER CENTER ONLY)
Abs Immature Granulocytes: 0.01 10*3/uL (ref 0.00–0.07)
Basophils Absolute: 0.1 10*3/uL (ref 0.0–0.1)
Basophils Relative: 1 %
Eosinophils Absolute: 0 10*3/uL (ref 0.0–0.5)
Eosinophils Relative: 0 %
HCT: 39.1 % (ref 39.0–52.0)
Hemoglobin: 12.9 g/dL — ABNORMAL LOW (ref 13.0–17.0)
Immature Granulocytes: 0 %
Lymphocytes Relative: 27 %
Lymphs Abs: 1.6 10*3/uL (ref 0.7–4.0)
MCH: 28.5 pg (ref 26.0–34.0)
MCHC: 33 g/dL (ref 30.0–36.0)
MCV: 86.3 fL (ref 80.0–100.0)
Monocytes Absolute: 0.5 10*3/uL (ref 0.1–1.0)
Monocytes Relative: 9 %
Neutro Abs: 3.8 10*3/uL (ref 1.7–7.7)
Neutrophils Relative %: 63 %
Platelet Count: 203 10*3/uL (ref 150–400)
RBC: 4.53 MIL/uL (ref 4.22–5.81)
RDW: 15.3 % (ref 11.5–15.5)
WBC Count: 6 10*3/uL (ref 4.0–10.5)
nRBC: 0 % (ref 0.0–0.2)

## 2019-08-01 LAB — CMP (CANCER CENTER ONLY)
ALT: 11 U/L (ref 0–44)
AST: 11 U/L — ABNORMAL LOW (ref 15–41)
Albumin: 3.7 g/dL (ref 3.5–5.0)
Alkaline Phosphatase: 104 U/L (ref 38–126)
Anion gap: 9 (ref 5–15)
BUN: 8 mg/dL (ref 8–23)
CO2: 29 mmol/L (ref 22–32)
Calcium: 8.5 mg/dL — ABNORMAL LOW (ref 8.9–10.3)
Chloride: 103 mmol/L (ref 98–111)
Creatinine: 0.6 mg/dL — ABNORMAL LOW (ref 0.61–1.24)
GFR, Est AFR Am: >60 mL/min (ref >60)
GFR, Estimated: >60 mL/min (ref >60)
Glucose, Bld: 78 mg/dL (ref 70–99)
Potassium: 3.8 mmol/L (ref 3.5–5.1)
Sodium: 141 mmol/L (ref 135–145)
Total Bilirubin: 0.4 mg/dL (ref 0.3–1.2)
Total Protein: 6.4 g/dL — ABNORMAL LOW (ref 6.5–8.1)

## 2019-08-01 MED ORDER — SODIUM CHLORIDE 0.9 % IV SOLN
Freq: Once | INTRAVENOUS | Status: AC
  Filled 2019-08-01: qty 250

## 2019-08-01 MED ORDER — HEPARIN SOD (PORK) LOCK FLUSH 100 UNIT/ML IV SOLN
500.0000 [IU] | Freq: Once | INTRAVENOUS | Status: AC
  Administered 2019-08-01 (×2): 500 [IU]
  Filled 2019-08-01: qty 5

## 2019-08-01 MED ORDER — SODIUM CHLORIDE 0.9% FLUSH
10.0000 mL | Freq: Once | INTRAVENOUS | Status: AC
  Administered 2019-08-01 (×2): 10 mL
  Filled 2019-08-01: qty 10

## 2019-08-01 NOTE — Progress Notes (Signed)
Pt provided with oral contrast and instructions through relative interpreting by phone, verbalized understanding.

## 2019-08-01 NOTE — Patient Instructions (Signed)

## 2019-08-01 NOTE — Patient Instructions (Signed)

## 2019-08-04 ENCOUNTER — Encounter: Payer: Self-pay | Admitting: *Deleted

## 2019-08-04 LAB — SURGICAL PATHOLOGY

## 2019-08-04 NOTE — Progress Notes (Signed)
[facility] Progress Note   Bobby Caldwell [id number] [birth year] 64 y.o.  Bobby Caldwell is managed by Dr. [name]  Actively treated with chemotherapy/immunotherapy/hormonal therapy: no  Current therapy: active surveillance   Next scheduled appointment with provider: 08/22/2019  Assessment: Plan:    Dehydration - Plan: 0.9 %  sodium chloride infusion  Gastric adenocarcinoma ([place]) - Plan: CT Abdomen Pelvis W Wo Contrast  Cough - Plan: CT Chest W Contrast   Dehydration: The patient was given 1 L of normal saline IV today.  Fatigue, hypoxia, dizziness, shortness of breath, anorexia and weight loss in the setting of a gastric adenocarcinoma: The patient was referred for restaging CT scans of the chest abdomen pelvis and will return as scheduled on 08/22/2019 for follow-up of the studies.  Please see After Visit Summary for patient specific instructions.  Future Appointments  Date Time Provider [place]  08/19/2019  9:00 AM [facility] 4 [facility] None  08/19/2019  9:15 AM [facility] FLUSH [facility] None  08/22/2019 10:00 AM [name], MD [facility] None    Orders Placed This Encounter  Procedures  . CT Abdomen Pelvis W Wo Contrast  . CT Chest W Contrast       Subjective:   Patient ID:  Bobby Caldwell is a 64 y.o. (DOB Jun 08, 1956) male.  Chief Complaint:  Chief Complaint  Patient presents with  . Fatigue    HPI Bobby Caldwell  Is a 64 y.o. male with a diagnosis of gastric adenocarcinoma.  He was treated with neoadjuvant FOLFOX and leucovorin for 4 cycles from 07/07/2017.  Through 09/06/2017.  His treatment was discontinued after cycle 4 due to poor tolerance.  Chemotherapy was followed by surgery for a cure.  He has been managed on surveillance since his surgery.  He was most recently seen on 02/15/2019.  He presents to the clinic today with a report of fatigue, episodes of hypoxia, dizziness, dyspnea on exertion, nausea, anorexia and weight loss.   He has a history of COPD and chronic hypoxemic respiratory failure.  Despite this he continues to smoke at least 10 cigarettes/day.  He is using oxygen at home at night.  He is scheduled to be seen in follow-up by Dr. Burr Medico on 08/22/2019.  Medications: I have reviewed the patient's current medications.  Allergies: No Known Allergies  Past Medical History:  Diagnosis Date  . COPD (chronic obstructive pulmonary disease) ([place])   . Dyspnea   . PAD (peripheral artery disease) ([place])   . Stomach cancer ([place])   . Tobacco abuse   . Weight loss     Past Surgical History:  Procedure Laterality Date  . ABDOMINAL AORTOGRAM W/LOWER EXTREMITY N/A 05/03/2017   Procedure: ABDOMINAL AORTOGRAM W/LOWER EXTREMITY;  Surgeon: Conrad [place], MD;  Location: [place] [facility];  Service: Cardiovascular;  Laterality: N/A;  . BIOPSY  08/22/2018   Procedure: BIOPSY;  Surgeon: [name], MD;  [facility];  Service: Endoscopy;;  . BIOPSY  07/31/2019   Procedure: BIOPSY;  Surgeon: [name], MD;  [facility];  Service: Endoscopy;;  . ESOPHAGOGASTRODUODENOSCOPY     05/24/17  . ESOPHAGOGASTRODUODENOSCOPY (EGD) WITH PROPOFOL N/A 05/24/2017   Procedure: ESOPHAGOGASTRODUODENOSCOPY (EGD) WITH PROPOFOL;  Surgeon: [name], MD;  [facility];  Service: Endoscopy;  Laterality: N/A;  . ESOPHAGOGASTRODUODENOSCOPY (EGD) WITH PROPOFOL N/A 08/22/2018   Procedure: ESOPHAGOGASTRODUODENOSCOPY (EGD) WITH PROPOFOL;  Surgeon: [name], MD;  [facility];  Service: Endoscopy;  Laterality: N/A;  .  ESOPHAGOGASTRODUODENOSCOPY (EGD) WITH PROPOFOL N/A 11/28/2018   Procedure: ESOPHAGOGASTRODUODENOSCOPY (EGD) WITH PROPOFOL;  Surgeon: [name], MD;  [facility];  Service: Endoscopy;  Laterality: N/A;  . ESOPHAGOGASTRODUODENOSCOPY (EGD) WITH PROPOFOL N/A 07/31/2019   Procedure: ESOPHAGOGASTRODUODENOSCOPY (EGD) WITH PROPOFOL;  Surgeon: [name], MD;   [facility];  Service: Endoscopy;  Laterality: N/A;  . EUS N/A 06/07/2017   Procedure: UPPER ENDOSCOPIC ULTRASOUND (EUS) RADIAL;  Surgeon: [name], MD;  [facility];  Service: Endoscopy;  Laterality: N/A;  . FEMORAL-FEMORAL BYPASS GRAFT Bilateral 05/08/2017   Procedure: LEFT TO RIGHT BYPASS GRAFT FEMORAL-FEMORAL ARTERY;  Surgeon: Conrad [place], MD;  Location: [place];  Service: Vascular;  Laterality: Bilateral;  . IR FLUORO GUIDE PORT INSERTION RIGHT  07/05/2017  . IR US GUIDE VASC ACCESS RIGHT  07/05/2017  . LAPAROSCOPIC INSERTION GASTROSTOMY TUBE N/A 11/27/2017   Procedure: JEJUNOSTOMY TUBE PLACEMENT;  Surgeon: [name], MD;  Location: [place];  Service: General;  Laterality: N/A;  . LAPAROSCOPIC PARTIAL GASTRECTOMY N/A 11/27/2017   Procedure: PARTIAL GASTRECTOMY;  Surgeon: [name], MD;  Location: [place];  Service: General;  Laterality: N/A;  . LAPAROSCOPY N/A 11/27/2017   Procedure: LAPAROSCOPY DIAGNOSTIC;  Surgeon: [name], MD;  Location: [place];  Service: General;  Laterality: N/A;  . NM MYOVIEW LTD  06/2017   LOW RISK. Small basal inferoseptal defect thought to be related to artifact (although cannot exclude prior infarct). Normal wall motion i in this area would suggest artifact not infarct. Otherwise no ischemia or infarction.  Marland Kitchen PERIPHERAL VASCULAR INTERVENTION Left 05/03/2017   Procedure: PERIPHERAL VASCULAR INTERVENTION;  Surgeon: Conrad [place], MD;  Location: [place] [facility];  Service: Cardiovascular;  Laterality: Left;  common iliac  . POLYPECTOMY  11/28/2018   Procedure: POLYPECTOMY;  Surgeon: [name], MD;  [facility];  Service: Endoscopy;;  . TRANSTHORACIC ECHOCARDIOGRAM  06/26/2017   Normal LV size and function. EF 60-65%. No wall motion abnormalities. GR 1 DD. Mild LA dilation    Family History  Family history unknown: Yes    Social History   Socioeconomic History  . Marital status: Married    Spouse name:  Not on file  . Number of children: 4  . Years of education: Not on file  . Highest education level: Not on file  Occupational History  . Not on file  Tobacco Use  . Smoking status: Current Every Day Smoker    Packs/day: 0.50    Years: 50.00    Pack years: 25.00    Types: Cigarettes    Last attempt to quit: 11/2017    Years since quitting: 1.7  . Smokeless tobacco: Never Used  . Tobacco comment: 1/2 pk a day 02/04/19  Substance and Sexual Activity  . Alcohol use: No    Alcohol/week: 0.0 standard drinks  . Drug use: No  . Sexual activity: Yes  Other Topics Concern  . Not on file  Social History Narrative  . Not on file   Social Determinants of Health   Financial Resource Strain:   . Difficulty of Paying Living Expenses: Not on file  Food Insecurity:   . Worried About Charity fundraiser in the Last Year: Not on file  . Ran Out of Food in the Last Year: Not on file  Transportation Needs:   . Lack of Transportation (Medical): Not on file  . Lack of Transportation (Non-Medical): Not on file  Physical Activity:   .  Days of Exercise per Week: Not on file  . Minutes of Exercise per Session: Not on file  Stress:   . Feeling of Stress : Not on file  Social Connections:   . Frequency of Communication with Friends and Family: Not on file  . Frequency of Social Gatherings with Friends and Family: Not on file  . Attends Religious Services: Not on file  . Active Member of Clubs or Organizations: Not on file  . Attends Archivist Meetings: Not on file  . Marital Status: Not on file  Intimate Partner Violence:   . Fear of Current or Ex-Partner: Not on file  . Emotionally Abused: Not on file  . Physically Abused: Not on file  . Sexually Abused: Not on file    Past Medical History, Surgical history, Social history, and Family history were reviewed and updated as appropriate.   Please see review of systems for further details on the patient's review from today.    Review of Systems:  Review of Systems  Constitutional: Positive for appetite change, fatigue and unexpected weight change. Negative for chills, diaphoresis and fever.  HENT: Negative for dental problem, mouth sores and trouble swallowing.   Respiratory: Positive for cough and shortness of breath. Negative for chest tightness.   Cardiovascular: Negative for chest pain and palpitations.  Gastrointestinal: Negative for constipation, diarrhea, nausea and vomiting.  Neurological: Positive for dizziness, weakness and light-headedness. Negative for syncope and headaches.    Objective:   Physical Exam:  BP (!) 150/86 (BP Location: Right Arm, Patient Position: Sitting) Comment: Liza Rn was notified  Pulse 96   Temp 98.3 F (36.8 C) (Temporal)   Resp 20   Ht 5\' 5"  (1.651 m)   Wt 101 lb 12.8 oz (46.2 kg)   SpO2 92%   BMI 16.94 kg/m  ECOG: 1  Physical Exam Constitutional:      General: He is not in acute distress.    Appearance: He is not diaphoretic.  HENT:     Head: Normocephalic and atraumatic.  Eyes:     General: No scleral icterus.       Right eye: No discharge.        Left eye: No discharge.  Cardiovascular:     Rate and Rhythm: Regular rhythm. Tachycardia present.     Heart sounds: Normal heart sounds. No murmur. No friction rub. No gallop.   Pulmonary:     Effort: Pulmonary effort is normal. No respiratory distress.     Breath sounds: No wheezing or rales.     Comments: Increased AP diameter. Decreased breath sounds all lung fields. Skin:    General: Skin is warm and dry.     Findings: No erythema or rash.  Neurological:     Mental Status: He is alert.     Gait: Gait normal.  Psychiatric:        Mood and Affect: Mood normal.        Behavior: Behavior normal.        Thought Content: Thought content normal.        Judgment: Judgment normal.     Lab Review:     Component Value Date/Time   NA 141 08/01/2019 0954   NA 139 07/11/2017 0848   K 3.8 08/01/2019  0954   K 3.5 07/11/2017 0848   CL 103 08/01/2019 0954   CO2 29 08/01/2019 0954   CO2 32 (H) 07/11/2017 0848   GLUCOSE 78 08/01/2019 0954   GLUCOSE 109 07/11/2017  0848   BUN 8 08/01/2019 0954   BUN 9.8 07/11/2017 0848   CREATININE 0.60 (L) 08/01/2019 0954   CREATININE 0.7 07/11/2017 0848   CALCIUM 8.5 (L) 08/01/2019 0954   CALCIUM 9.0 07/11/2017 0848   PROT 6.4 (L) 08/01/2019 0954   PROT 7.1 07/11/2017 0848   ALBUMIN 3.7 08/01/2019 0954   ALBUMIN 3.6 07/11/2017 0848   AST 11 (L) 08/01/2019 0954   AST 15 07/11/2017 0848   ALT 11 08/01/2019 0954   ALT 17 07/11/2017 0848   ALKPHOS 104 08/01/2019 0954   ALKPHOS 116 07/11/2017 0848   BILITOT 0.4 08/01/2019 0954   BILITOT 0.54 07/11/2017 0848   GFRNONAA >60 08/01/2019 0954   GFRAA >60 08/01/2019 0954       Component Value Date/Time   WBC 6.0 08/01/2019 0954   WBC 8.3 02/14/2019 1310   RBC 4.53 08/01/2019 0954   HGB 12.9 (L) 08/01/2019 0954   HGB 15.0 07/11/2017 0848   HCT 39.1 08/01/2019 0954   HCT 43.7 07/11/2017 0848   PLT 203 08/01/2019 0954   PLT 165 07/11/2017 0848   MCV 86.3 08/01/2019 0954   MCV 86.7 07/11/2017 0848   MCH 28.5 08/01/2019 0954   MCHC 33.0 08/01/2019 0954   RDW 15.3 08/01/2019 0954   RDW 12.8 07/11/2017 0848   LYMPHSABS 1.6 08/01/2019 0954   LYMPHSABS 1.4 07/11/2017 0848   MONOABS 0.5 08/01/2019 0954   MONOABS 0.6 07/11/2017 0848   EOSABS 0.0 08/01/2019 0954   EOSABS 0.2 07/11/2017 0848   BASOSABS 0.1 08/01/2019 0954   BASOSABS 0.1 07/11/2017 0848   -------------------------------  Imaging from last 24 hours (if applicable):  Radiology interpretation: No results found.

## 2019-08-05 ENCOUNTER — Other Ambulatory Visit: Payer: Self-pay

## 2019-08-05 ENCOUNTER — Encounter: Payer: Self-pay | Admitting: Pulmonary Disease

## 2019-08-05 ENCOUNTER — Ambulatory Visit: Payer: Medicaid Other | Admitting: Pulmonary Disease

## 2019-08-05 VITALS — BP 136/78 | Temp 97.8°F | Ht 65.0 in | Wt 100.6 lb

## 2019-08-05 DIAGNOSIS — R636 Underweight: Secondary | ICD-10-CM

## 2019-08-05 DIAGNOSIS — R0600 Dyspnea, unspecified: Secondary | ICD-10-CM | POA: Diagnosis not present

## 2019-08-05 DIAGNOSIS — J449 Chronic obstructive pulmonary disease, unspecified: Secondary | ICD-10-CM | POA: Diagnosis not present

## 2019-08-05 DIAGNOSIS — R0609 Other forms of dyspnea: Secondary | ICD-10-CM

## 2019-08-05 MED ORDER — SPIRIVA RESPIMAT 2.5 MCG/ACT IN AERS: 2.0000 | INHALATION_SPRAY | Freq: Every day | RESPIRATORY_TRACT | 2 refills | Status: DC

## 2019-08-05 MED ORDER — SPIRIVA RESPIMAT 2.5 MCG/ACT IN AERS: 2.0000 | INHALATION_SPRAY | Freq: Every day | RESPIRATORY_TRACT | 0 refills | Status: DC

## 2019-08-05 MED ORDER — MEGESTROL ACETATE 40 MG/ML PO SUSP: ORAL | 2 refills | Status: DC

## 2019-08-05 NOTE — Patient Instructions (Signed)
COPD  Your oxygen level was good when you walked around today-you do not need oxygen supplementation  We will get your prescription for Spiriva Prescription for Megace for appetite  I will see you in 3 months  Call with significant concerns  Stay off cigarettes

## 2019-08-05 NOTE — Addendum Note (Signed)
Addended by: Elton Sin on: 08/05/2019 02:10 PM   Modules accepted: Orders

## 2019-08-05 NOTE — Progress Notes (Signed)
Bobby Caldwell    AM:[id number]    [birth year]  Primary Care Physician:Patient, No Pcp Per  Referring Physician: No referring provider defined for this encounter.  Chief complaint:   Patient being seen for follow-up for obstructive lung disease Still with shortness of breath  HPI: History of stomach cancer Decreased appetite Cough with shortness of breath Still actively smoking  Had questions about requiring oxygen He was ambulated in the office today and did not desaturate  Has severe COPD/emphysema  Most significant concern today was worsening depression  Decreased appetite Stable lung nodules as of 08/14/2018  Started smoking at age 64, greater than 80-pack-year smoking history currently smoking about half a pack per day Was able to quit for a while but currently back smoking actively  He feels generally well, states he is under a lot of stress and this is contributing to him continuing to smoke actively  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 budesonide-formoterol (SYMBICORT) 160-4.5 MCG/ACT inhaler Inhale 2 puffs into the lungs 2 (two) times daily.  . fluconazole (DIFLUCAN) 100 MG tablet Take 1 tablet (100 mg total) by mouth daily for 10 days.  Marland Kitchen ipratropium-albuterol (DUONEB) 0.5-2.5 (3) MG/3ML SOLN USE 1 AMPULE IN NEBULIZER 4 TIMES DAILY (Patient taking differently: Inhale 3 mLs into the lungs 4 (four) times daily. )  . buPROPion (ZYBAN) 150 MG 12 hr tablet Take 150 mg by mouth daily.  . chlorhexidine (PERIDEX) 0.12 % solution Use as directed 15 mLs in the mouth or throat 2 (two) times daily.  Marland Kitchen escitalopram (LEXAPRO) 10 MG tablet Take 1 tablet (10 mg total) by mouth at bedtime. (Patient not taking: Reported on 08/05/2019)  . megestrol (MEGACE)  40 MG/ML suspension TAKE 5 ML BY MOUTH  4 TIMES DAILY  . mirtazapine (REMERON) 7.5 MG tablet Take 7.5 mg by mouth at bedtime.  . potassium chloride SA (K-DUR) 20 MEQ tablet Take 1 tablet (20 mEq total) by mouth daily. (Patient not taking: Reported on 08/05/2019)  . Tiotropium Bromide Monohydrate (SPIRIVA RESPIMAT) 2.5 MCG/ACT AERS Inhale 2 puffs into the lungs daily.  . [DISCONTINUED] megestrol (MEGACE) 40 MG/ML suspension TAKE 5 ML BY MOUTH  4 TIMES DAILY (Patient not taking: Reported on 08/05/2019)  . [DISCONTINUED] Tiotropium Bromide Monohydrate (SPIRIVA RESPIMAT) 2.5 MCG/ACT AERS Inhale 2 puffs into the lungs daily.   No facility-administered encounter medications on file as of 08/05/2019.    Allergies as of 08/05/2019  . (No Known Allergies)    Past Medical History:  Diagnosis Date  . COPD (chronic obstructive pulmonary disease) ([place])   . Dyspnea   . PAD (peripheral artery disease) ([place])   . Stomach cancer ([place])   . Tobacco abuse   . Weight loss     Past Surgical History:  Procedure Laterality Date  . ABDOMINAL AORTOGRAM W/LOWER EXTREMITY N/A 05/03/2017   Procedure: ABDOMINAL AORTOGRAM W/LOWER EXTREMITY;  Surgeon: Conrad [place], MD;  Location: [facility];  Service: Cardiovascular;  Laterality: N/A;  . BIOPSY  08/22/2018   Procedure: BIOPSY;  Surgeon: [name], MD;  Location: [facility];  Service: Endoscopy;;  . BIOPSY  07/31/2019   Procedure: BIOPSY;  Surgeon: [name], MD;  Location: [facility];  Service: Endoscopy;;  . ESOPHAGOGASTRODUODENOSCOPY  05/24/17  . ESOPHAGOGASTRODUODENOSCOPY (EGD) WITH PROPOFOL N/A 05/24/2017   Procedure: ESOPHAGOGASTRODUODENOSCOPY (EGD) WITH PROPOFOL;  Surgeon: [name], MD;  Location: [facility];  Service: Endoscopy;  Laterality: N/A;  . ESOPHAGOGASTRODUODENOSCOPY (EGD) WITH PROPOFOL N/A 08/22/2018   Procedure: ESOPHAGOGASTRODUODENOSCOPY (EGD) WITH PROPOFOL;  Surgeon: [name], MD;  [facility];  Service: Endoscopy;  Laterality: N/A;  . ESOPHAGOGASTRODUODENOSCOPY (EGD) WITH PROPOFOL N/A 11/28/2018   Procedure: ESOPHAGOGASTRODUODENOSCOPY (EGD) WITH PROPOFOL;  Surgeon: [name], MD;  [facility]: [facility];  Service: Endoscopy;  Laterality: N/A;  . ESOPHAGOGASTRODUODENOSCOPY (EGD) WITH PROPOFOL N/A 07/31/2019   Procedure: ESOPHAGOGASTRODUODENOSCOPY (EGD) WITH PROPOFOL;  Surgeon: [name], MD;  Location: [facility];  Service: Endoscopy;  Laterality: N/A;  . EUS N/A 06/07/2017   Procedure: UPPER ENDOSCOPIC ULTRASOUND (EUS) RADIAL;  Surgeon: [name], MD;  Location: [facility];  Service: Endoscopy;  Laterality: N/A;  . FEMORAL-FEMORAL BYPASS GRAFT Bilateral 05/08/2017   Procedure: LEFT TO RIGHT BYPASS GRAFT FEMORAL-FEMORAL ARTERY;  Surgeon: Conrad [place], MD;  Location: [facility];  Service: Vascular;  Laterality: Bilateral;  . IR FLUORO GUIDE PORT INSERTION RIGHT  07/05/2017  . IR US GUIDE VASC ACCESS RIGHT  07/05/2017  . LAPAROSCOPIC INSERTION GASTROSTOMY TUBE N/A 11/27/2017   Procedure: JEJUNOSTOMY TUBE PLACEMENT;  Surgeon: [name], MD;  Location: [facility];  Service: General;  Laterality: N/A;  . LAPAROSCOPIC PARTIAL GASTRECTOMY N/A 11/27/2017   Procedure: PARTIAL GASTRECTOMY;  Surgeon: [name], MD;  Location: [facility];  Service: General;  Laterality: N/A;  . LAPAROSCOPY N/A 11/27/2017   Procedure: LAPAROSCOPY DIAGNOSTIC;  Surgeon: [name], MD;  Location: [facility];  Service: General;  Laterality: N/A;  . NM MYOVIEW LTD  06/2017   LOW RISK. Small basal inferoseptal defect thought to be related to artifact (although cannot exclude prior infarct). Normal wall motion i in this area would suggest artifact not infarct. Otherwise no ischemia or infarction.  Marland Kitchen PERIPHERAL VASCULAR INTERVENTION Left 05/03/2017   Procedure: PERIPHERAL VASCULAR INTERVENTION;  Surgeon: Conrad , MD;  Location: [facility];  Service: Cardiovascular;  Laterality: Left;   common iliac  . POLYPECTOMY  11/28/2018   Procedure: POLYPECTOMY;  Surgeon: [name], MD;  Location: [facility];  Service: Endoscopy;;  . TRANSTHORACIC ECHOCARDIOGRAM  06/26/2017   Normal LV size and function. EF 60-65%. No wall motion abnormalities. GR 1 DD. Mild LA dilation    Family History  Family history unknown: Yes    Social History   Socioeconomic History  . Marital status: Married    Spouse name: Not on file  . Number of children: 4  . Years of education: Not on file  . Highest education level: Not on file  Occupational History  . Not on file  Tobacco Use  . Smoking status: Current Every Day Smoker    Packs/day: 0.50    Years: 50.00    Pack years: 25.00    Types: Cigarettes    Last attempt to quit: 11/2017    Years since quitting: 1.7  . Smokeless tobacco: Never Used  . Tobacco comment: 10 cigs per day 08/05/19  Substance and Sexual Activity  . Alcohol use: No    Alcohol/week: 0.0 standard drinks  . Drug use: No  . Sexual activity: Yes  Other Topics Concern  . Not on file  Social History Narrative  . Not on file   Social Determinants of Health   Financial Resource Strain:   . Difficulty of Paying  Living Expenses: Not on file  Food Insecurity:   . Worried About Charity fundraiser in the Last Year: Not on file  . Ran Out of Food in the Last Year: Not on file  Transportation Needs:   . Lack of Transportation (Medical): Not on file  . Lack of Transportation (Non-Medical): Not on file  Physical Activity:   . Days of Exercise per Week: Not on file  . Minutes of Exercise per Session: Not on file  Stress:   . Feeling of Stress : Not on file  Social Connections:   . Frequency of Communication with Friends and Family: Not on file  . Frequency of Social Gatherings with Friends and Family: Not on file  . Attends Religious Services: Not on file  . Active Member of Clubs or Organizations: Not on file  . Attends Archivist Meetings: Not on  file  . Marital Status: Not on file  Intimate Partner Violence:   . Fear of Current or Ex-Partner: Not on file  . Emotionally Abused: Not on file  . Physically Abused: Not on file  . Sexually Abused: Not on file    Review of Systems  Constitutional: Positive for appetite change. Negative for fatigue.  HENT: Negative.   Eyes: Negative.   Respiratory: Positive for shortness of breath. Negative for cough, choking and chest tightness.   Cardiovascular: Negative.   Gastrointestinal: Negative.   Endocrine: Negative.   Psychiatric/Behavioral:       Depression    Vitals:   08/05/19 1204  BP: 136/78  Temp: 97.8 F (36.6 C)  SpO2: 95%     Physical Exam  Constitutional:  Underweight  HENT:  Head: Normocephalic and atraumatic.  Eyes: Pupils are equal, round, and reactive to light. Conjunctivae and EOM are normal. Right eye exhibits no discharge. Left eye exhibits no discharge.  Neck: No tracheal deviation present. No thyromegaly present.  Cardiovascular: Normal rate and regular rhythm.  Pulmonary/Chest: Effort normal and breath sounds normal. No respiratory distress. He has no wheezes. He has no rales.  Abdominal: Soft.  Musculoskeletal:     Cervical back: Normal range of motion and neck supple.   Data Reviewed:  Hospital records reviewed Spirometry from 2019 revealed severe obstructive disease  Recent CT scan of the chest 08/14/2018 was stable   Assessment:  .  Advanced COPD -Continues to smoke actively -Not very compliant with inhalers  .  Active smoker -Continues to smoke actively although says he is cutting down  .  History of gastric cancer -Continues to follow-up with oncology on a regular basis  .  Referral arterial disease .  Depression .  Anorexia -He was on Megace -We will provide refill for him   Plan/Recommendations: .  Continue current medications .  Continue bronchodilator treatment .  Continue Spiriva-prescription and refill sent to pharmacy .   Prescription refill sent in for Megace .  Continue Symbicort .  I did reassure him that he does not need oxygen supplementation as his oxygen level stayed 93 and above even with ambulation  I will see him back in the office in about 3 months   Sherrilyn Rist MD [facility] 08/05/2019, 12:17 PM  CC: No ref. provider found

## 2019-08-15 ENCOUNTER — Other Ambulatory Visit: Payer: Self-pay | Admitting: Medical

## 2019-08-15 ENCOUNTER — Other Ambulatory Visit: Payer: Self-pay | Admitting: Adult Health

## 2019-08-15 DIAGNOSIS — C169 Malignant neoplasm of stomach, unspecified: Secondary | ICD-10-CM

## 2019-08-15 DIAGNOSIS — R634 Abnormal weight loss: Secondary | ICD-10-CM

## 2019-08-15 NOTE — Progress Notes (Signed)
[place]   [phone] Fax:([phone]) [phone]   Clinic Follow up Note   Patient Care Team: Patient, No Pcp Per as PCP - General (General Practice) [name], MD as Consulting Physician (Pulmonary Disease) [name], MD as Attending Physician (Gastroenterology) [name], MD as Consulting Physician (General Surgery)  Date of Service:  08/22/2019  CHIEF COMPLAINT: F/u on gastric adenocarcinoma  SUMMARY OF ONCOLOGIC HISTORY: Oncology History Overview Note  Cancer Staging Gastric cancer [facility]) Staging form: Stomach, AJCC 8th Edition - Clinical stage from 05/24/2017: Stage I (cT2, cN0, cM0) - Signed by [name], MD on 06/19/2017 - Pathologic stage from 11/27/2017: Stage I (ypT1b, pN0, cM0) - Signed by [name], MD on 04/04/2018     Gastric adenocarcinoma ([place])  05/24/2017 Procedure   EGD by Dr. [name] 05/24/17 IMPRESSION Ulcerated mass like region in the distal stomach (4cm across), extensively biopsied. This appears neoplastic. - Abnormal mucosa throughout stomach otherwise (mild-moderate gastritis), also extensively biopsied.   05/24/2017 Initial Biopsy   Diagnosis 05/24/17  1. Stomach, biopsy, mass distal - ADENOCARCINOMA, SEE COMMENT. - CHRONIC ACTIVE GASTRITIS WITH INTESTINAL METAPLASIA. 2. 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 - NO DYSPLASIA OR MALIGNANCY   06/04/2017 Initial Diagnosis   Gastric cancer ([place])   06/07/2017 Procedure   EUS by Dr. [name] on 06/07/17  IMPRESSION:  5cm across, partially circumferential uT2N0 gastric adenocarcinoma laying in the distal stomach 3-4cm proximal to the pylorus.   06/22/2017 Imaging   CT CAP W Contrast 06/22/17  IMPRESSION: 1. The gastric mass is somewhat inconspicuous on CT, but a potential 1.2 cm thick mucosal masses seen along the posterior gastric border on image 83/2. There several small  adjacent right gastric lymph nodes measuring up to 0.7 cm in short axis, but no overtly pathologic adenopathy and no findings of hepatic or peritoneal metastatic disease at this time. 2. Calcified granulomas in both lungs along with several noncalcified nodules stable from August 2016 and likely benign given this long-term stability. 3. Other imaging findings of potential clinical significance: Aortic Atherosclerosis (ICD10-I70.0) and Emphysema (ICD10-J43.9). Airway thickening is present, suggesting bronchitis or reactive airways disease. 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 Disc bulges at L3-4 and L4-5.   07/05/2017 Surgery   PAC placement    07/07/2017 - 09/06/2017 Chemotherapy   Neoadjuvant FOLFOX with with leucovorin every 2 weeks. Plan for 6 cycles but stopped after 4 cycles due to poor tolerance     09/10/2017 Imaging    CT AP W Contrast 09/10/17 IMPRESSION: 1. No acute findings identified within the abdomen or pelvis. 2. Interval development of multiple bilateral pulmonary densities within both lower lobes. These are indeterminate, likely post infectious or inflammatory in etiology. Atypical infection not excluded. Metastatic disease is considered less favored but not entirely excluded and follow-up imaging to ensure resolution is advised. 3. No evidence to suggest recurrent tumor or metastatic disease within the abdomen or pelvis. 4.  Aortic Atherosclerosis (ICD10-I70.0).   11/27/2017 Surgery    Surgery He had partial gastrectomy with Dr. [name] on 11/27/2017.   11/27/2017 Pathology Results   11/27/2017 Surgical Pathology Diagnosis 1. Stomach, resection for tumor, Distal w/ Omentum - ADENOCARCINOMA, MODERATELY DIFFERENTIATED, 3.8 CM - LYMPHOVASCULAR SPACE AND PERINEURAL INVASION - NO CARCINOMA IDENTIFIED IN FOUR LYMPH NODES (0/4) - SEE ONCOLOGY TABLE AND COMMENT BELOW 2. Lymph node, biopsy,  Portal - NO CARCINOMA  IDENTIFIED IN ONE LYMPH NODE (0/1) - SEE COMMENT 3. Lymph node, biopsy, Left Gastric - NO CARCINOMA IDENTIFIED IN TWO LYMPH NODES (0/2) - SEE COMMENT 4. Omentum, resection for tumor - BENIGN FIBROADIPOSE TISSUE - NO CARCINOMA IDENTIFIED    11/27/2017 Cancer Staging   Staging form: Stomach, AJCC 8th Edition - Pathologic stage from 11/27/2017: Stage I (ypT1b, pN0, cM0) - Signed by [name], MD on 04/04/2018   08/14/2018 Imaging   CT CAP 08/14/18 IMPRESSION:  1. No findings of active malignancy. 2. Generally stable pulmonary nodules are identified in the lungs. The only new nodule seen today is a 3 mm right apical nodule on image 32/4 which may merit surveillance. Many of the basilar nodules shown on the prior CT abdomen from 09/10/2017 have resolved. 3. Other imaging findings of potential clinical significance: Aortic Atherosclerosis (ICD10-I70.0). Emphysema (ICD10-J43.9). Lumbar degenerative disc disease.   08/22/2018 Procedure   Upper endoscopy 08/22/18 by Dr Verdie Drown The esophagus was slightly tortuous but there were no strictures/stenosis and the mucosa was normal throughout. The gastro-jejunostomy site was widely patent, Bilroth II anatomy. There was a 1.5-2cm region of the anastomosis that was discretely abnormal, soft but somewhat neoplastic appearing and nodular. I biopsied this region extensively. Diagnosis Stomach, biopsy, GJ anastomosis erythema - LOW GRADE GLANDULAR DYSPLASIA. - GOBLET CELL METAPLASIA. - SEE COMMENT.   11/28/2018 Procedure   Upper Endoscopy 11/28/18 by Dr Verdie Drown - Bilroth II anatomy. The previously noted adenomatous polyp was again located at the [place] anastomosis. It was soft, sessile, about 1.9cm across in greatest dimension. I used mixed cold/cautery snare resection to (apparently) completely remove the polyp. Diagnosis Stomach, polyp(s) - LOW GRADE GLANDULAR DYSPLASIA. - GOBLET CELL METAPLASIA.     07/31/2019 Procedure   Upper Endoscopy by Dr. [name] 07/31/19  IMPRESSION - Typical Bilroth II anatomy. The GJ anastomosis was slightly inflammed, but not overtly neoplastic appearing. No clear recurrent or residual polyps. The anastomosis was sampled at the sight of most significant appearing inflammation (1-2cm section, see images). - Afferent and efferent limbs were intubated briefly and appeared normal. - Mild candida esophagitis, not sampled. Will start empiric diflucan course (100mg  daily for 10 days) - The examination was otherwise normal. FINAL MICROSCOPIC DIAGNOSIS:   A.  IRREGULAR MUCOSA AT GJ JUNCTION, BIOPSY:  -Anastomosis site with peptic change and low grade dysplasia.       CURRENT THERAPY:  Surveillance  INTERVAL HISTORY:  Bobby Caldwell is here for a follow up gastric adenocarcinoma. He was last seen by me 6 months ago. He unfortunately got COVID infection 2 months ago, and was quite sick at home.  He did not need hospitalization for his COVID.  He had a slow recovery, was seen by our symptom management clinic about 3 weeks ago, for fatigue, hypoxia, dizziness and shortness of breath.  With supportive care, he gradually recovered well.  He still has no appetite, lost some weight daily.  No other new complaints.  Review of system otherwise negative.   MEDICAL HISTORY:  Past Medical History:  Diagnosis Date  . COPD (chronic obstructive pulmonary disease) ([place])   . Dyspnea   . PAD (peripheral artery disease) ([place])   . Stomach cancer ([place])   . Tobacco abuse   . Weight loss     SURGICAL HISTORY: Past Surgical History:  Procedure Laterality Date  . ABDOMINAL AORTOGRAM W/LOWER EXTREMITY N/A 05/03/2017   Procedure: ABDOMINAL AORTOGRAM W/LOWER EXTREMITY;  Surgeon: Conrad [place], MD;  [facility]  INVASIVE CV LAB;  Service: Cardiovascular;  Laterality: N/A;  . BIOPSY  08/22/2018   Procedure: BIOPSY;  Surgeon: [name], MD;  Location: [facility];  Service:  Endoscopy;;  . BIOPSY  07/31/2019   Procedure: BIOPSY;  Surgeon: [name], MD;  Location: [facility];  Service: Endoscopy;;  . ESOPHAGOGASTRODUODENOSCOPY     05/24/17  . ESOPHAGOGASTRODUODENOSCOPY (EGD) WITH PROPOFOL N/A 05/24/2017   Procedure: ESOPHAGOGASTRODUODENOSCOPY (EGD) WITH PROPOFOL;  Surgeon: [name], MD;  Location: [facility];  Service: Endoscopy;  Laterality: N/A;  . ESOPHAGOGASTRODUODENOSCOPY (EGD) WITH PROPOFOL N/A 08/22/2018   Procedure: ESOPHAGOGASTRODUODENOSCOPY (EGD) WITH PROPOFOL;  Surgeon: [name], MD;  Location: [facility];  Service: Endoscopy;  Laterality: N/A;  . ESOPHAGOGASTRODUODENOSCOPY (EGD) WITH PROPOFOL N/A 11/28/2018   Procedure: ESOPHAGOGASTRODUODENOSCOPY (EGD) WITH PROPOFOL;  Surgeon: [name], MD;  Location: [facility];  Service: Endoscopy;  Laterality: N/A;  . ESOPHAGOGASTRODUODENOSCOPY (EGD) WITH PROPOFOL N/A 07/31/2019   Procedure: ESOPHAGOGASTRODUODENOSCOPY (EGD) WITH PROPOFOL;  Surgeon: [name], MD;  Location: [facility];  Service: Endoscopy;  Laterality: N/A;  . EUS N/A 06/07/2017   Procedure: UPPER ENDOSCOPIC ULTRASOUND (EUS) RADIAL;  Surgeon: [name], MD;  Location: [facility];  Service: Endoscopy;  Laterality: N/A;  . FEMORAL-FEMORAL BYPASS GRAFT Bilateral 05/08/2017   Procedure: LEFT TO RIGHT BYPASS GRAFT FEMORAL-FEMORAL ARTERY;  Surgeon: Conrad [place], MD;  Location: [place];  Service: Vascular;  Laterality: Bilateral;  . IR FLUORO GUIDE PORT INSERTION RIGHT  07/05/2017  . IR US GUIDE VASC ACCESS RIGHT  07/05/2017  . LAPAROSCOPIC INSERTION GASTROSTOMY TUBE N/A 11/27/2017   Procedure: JEJUNOSTOMY TUBE PLACEMENT;  Surgeon: [name], MD;  Location: [place];  Service: General;  Laterality: N/A;  . LAPAROSCOPIC PARTIAL GASTRECTOMY N/A 11/27/2017   Procedure: PARTIAL GASTRECTOMY;  Surgeon: [name], MD;  Location: [place];  Service: General;  Laterality: N/A;  . LAPAROSCOPY N/A 11/27/2017    Procedure: LAPAROSCOPY DIAGNOSTIC;  Surgeon: [name], MD;  Location: [address];  Service: General;  Laterality: N/A;  . NM MYOVIEW LTD  06/2017   LOW RISK. Small basal inferoseptal defect thought to be related to artifact (although cannot exclude prior infarct). Normal wall motion i in this area would suggest artifact not infarct. Otherwise no ischemia or infarction.  Marland Kitchen PERIPHERAL VASCULAR INTERVENTION Left 05/03/2017   Procedure: PERIPHERAL VASCULAR INTERVENTION;  Surgeon: Conrad Bol[place]ar, MD;  Location: [place] CV LAB;  Service: Cardiovascular;  Laterality: Left;  common iliac  . POLYPECTOMY  11/28/2018   Procedure: POLYPECTOMY;  Surgeon: [name], MD;  Location: [facility];  Service: Endoscopy;;  . TRANSTHORACIC ECHOCARDIOGRAM  06/26/2017   Normal LV size and function. EF 60-65%. No wall motion abnormalities. GR 1 DD. Mild LA dilation    I have reviewed the social history and family history with the patient and they are unchanged from previous note.  ALLERGIES:  has No Known Allergies.  MEDICATIONS:  Current Outpatient Medications  Medication Sig Dispense Refill  . budesonide-formoterol (SYMBICORT) 160-4.5 MCG/ACT inhaler Inhale 2 puffs into the lungs 2 (two) times daily. 1 Inhaler 6  . ipratropium-albuterol (DUONEB) 0.5-2.5 (3) MG/3ML SOLN Inhale 3 mLs into the lungs 4 (four) times daily. Dx:J44.9 360 mL 3  . megestrol (MEGACE) 40 MG/ML suspension TAKE 5 ML BY MOUTH  4 TIMES DAILY 240 mL 2  . Tiotropium Bromide Monohydrate (SPIRIVA RESPIMAT) 2.5 MCG/ACT AERS Inhale 2 puffs into the lungs daily. 4 g 2  . buPROPion (ZYBAN) 150  MG 12 hr tablet Take 150 mg by mouth daily.    . chlorhexidine (PERIDEX) 0.12 % solution Use as directed 15 mLs in the mouth or throat 2 (two) times daily.    Marland Kitchen escitalopram (LEXAPRO) 10 MG tablet Take 1 tablet (10 mg total) by mouth at bedtime. (Patient not taking: Reported on 08/05/2019) 30 tablet 3  . mirtazapine (REMERON) 7.5 MG tablet Take 7.5 mg by  mouth at bedtime.    . potassium chloride SA (K-DUR) 20 MEQ tablet Take 1 tablet (20 mEq total) by mouth daily. (Patient not taking: Reported on 08/05/2019) 7 tablet 0  . Tiotropium Bromide Monohydrate (SPIRIVA RESPIMAT) 2.5 MCG/ACT AERS Inhale 2 puffs into the lungs daily. 4 g 0   No current facility-administered medications for this visit.    PHYSICAL EXAMINATION: ECOG PERFORMANCE STATUS: 2 - Symptomatic, <50% confined to bed  Vitals:   08/22/19 0953  BP: 134/90  Pulse: 94  Resp: 17  Temp: 97.8 F (36.6 C)  SpO2: 97%   Filed Weights   08/22/19 0953  Weight: 101 lb 3.2 oz (45.9 kg)   GENERAL:alert, no distress and comfortable SKIN: skin color, texture, turgor are normal, no rashes or significant lesions EYES: normal, Conjunctiva are pink and non-injected, sclera clear NECK: supple, thyroid normal size, non-tender, without nodularity LYMPH:  no palpable lymphadenopathy in the cervical, axillary  LUNGS: Decreased breath sounds bilateral, no rales or wheezing  HEART: regular rate & rhythm and no murmurs and no lower extremity edema ABDOMEN:abdomen soft, non-tender and normal bowel sounds Musculoskeletal:no cyanosis of digits and no clubbing  NEURO: alert & oriented x 3 with fluent speech, no focal motor/sensory deficits  LABORATORY DATA:  I have reviewed the data as listed CBC Latest Ref Rng & Units 08/19/2019 08/01/2019 02/14/2019  WBC 4.0 - 10.5 K/uL 12.4(H) 6.0 8.3  Hemoglobin 13.0 - 17.0 g/dL 14.6 12.9(L) 13.7  Hematocrit 39.0 - 52.0 % 44.2 39.1 41.4  Platelets 150 - 400 K/uL 281 203 232     CMP Latest Ref Rng & Units 08/19/2019 08/01/2019 02/14/2019  Glucose 70 - 99 mg/dL 78 78 188(H)  BUN 8 - 23 mg/dL 17 8 7(L)  Creatinine 0.61 - 1.24 mg/dL 0.63 0.60(L) 0.68  Sodium 135 - 145 mmol/L 140 141 141  Potassium 3.5 - 5.1 mmol/L 4.0 3.8 3.3(L)  Chloride 98 - 111 mmol/L 102 103 106  CO2 22 - 32 mmol/L 29 29 22   Calcium 8.9 - 10.3 mg/dL 8.6(L) 8.5(L) 9.2  Total Protein 6.5 - 8.1  g/dL 6.8 6.4(L) 6.7  Total Bilirubin 0.3 - 1.2 mg/dL 0.3 0.4 0.3  Alkaline Phos 38 - 126 U/L 77 104 102  AST 15 - 41 U/L 11(L) 11(L) 12(L)  ALT 0 - 44 U/L 13 11 11       RADIOGRAPHIC STUDIES: I have personally reviewed the radiological images as listed and agreed with the findings in the report. No results found.   ASSESSMENT & PLAN:  Bobby Caldwell is a 64 y.o. male with    1. Gastric Adenocarcinoma, uT2N0M0, stage I, ypT1bN0 -Diagnosed in 05/2017. Treated withneoadjuvantchemo and surgery. Currently on surveillance. -His Upper Endoscopy with Dr [name] from 07/31/19 shows no evidence of recurrence, mild esophagitis, which was treated with fluconazole. -We discussed his CT CAP form 08/18/19 with pt which shows no evidence of recurrence. -He is clinically stable, will continue surveillance, plan to repeat 1 more surveillance scan in early 2022 -he plans to go to [place] in June for 3 months  -  continue port flush -f/u in early Sep    2. COPD, Smoking Cessation -I previously advised him to quit smoking. -He smokes 10 cigarettes a day and is presenting with mild issues breathing.Iagainstrongly encouraged him to quit smoking completely -He is on bupropion which helps  -f/u with pulmonary clinic, I asked him to call for meds refill   3.Depression and or anxiety -Currently on Ativan as needed. Continue and monitor.  4.Low appetite and weight loss -Repeat endoscopies in February a and May 2020 show benign polyp at [facility] anastamosis. -He has megace, mirtazapine and Wellbutrin. He responds better to megace (he uses as needed) but also still takes Mirtazapine. Given all 3 drugs are similar and he has been abel to gain weight well, I recommend he d/c mirtizapine. He is agreeable. previously stopped mirtazapine -due to his poor appetite lately, I called in mirtazapine and increase dose to 15 mg at bedtime  5. COVID19 (+)  -He tested positive for COVID19 on 06/23/19.  He was quite  symptomatic, but did not require hospitalization.  -recovered now   Plan -I called in mirtazapine 15mg  HS for him today  -He will call his pulmonologist to refill his inhalers -Port flush every 2 months  -Back, flush and follow-up in early September when he returns from his trip to [place]    No problem-specific [place] notes found for this encounter.   No orders of the defined types were placed in this encounter.  All questions were answered. The patient knows to call the clinic with any problems, questions or concerns. No barriers to learning was detected. The total time spent in the appointment was 30 minutes.     [name], MD 08/22/2019   I, Joslyn Devon, am acting as scribe for [name], MD.   I have reviewed the above documentation for accuracy and completeness, and I agree with the above.

## 2019-08-18 ENCOUNTER — Ambulatory Visit (HOSPITAL_COMMUNITY)
Admission: RE | Admit: 2019-08-18 | Discharge: 2019-08-18 | Disposition: A | Discharge status: Home / Self Care | Payer: Medicaid Other | Source: Ambulatory Visit | Attending: Medical | Admitting: Medical

## 2019-08-18 ENCOUNTER — Other Ambulatory Visit: Payer: Self-pay

## 2019-08-18 ENCOUNTER — Encounter (HOSPITAL_COMMUNITY): Payer: Self-pay

## 2019-08-18 DIAGNOSIS — C169 Malignant neoplasm of stomach, unspecified: Secondary | ICD-10-CM | POA: Insufficient documentation

## 2019-08-18 DIAGNOSIS — R634 Abnormal weight loss: Secondary | ICD-10-CM | POA: Diagnosis present

## 2019-08-18 DIAGNOSIS — R059 Cough, unspecified: Secondary | ICD-10-CM

## 2019-08-18 DIAGNOSIS — R05 Cough: Secondary | ICD-10-CM | POA: Insufficient documentation

## 2019-08-18 MED ORDER — HEPARIN SOD (PORK) LOCK FLUSH 100 UNIT/ML IV SOLN
INTRAVENOUS | Status: AC
  Filled 2019-08-18: qty 5

## 2019-08-18 MED ORDER — SODIUM CHLORIDE (PF) 0.9 % IJ SOLN
INTRAMUSCULAR | Status: AC
  Filled 2019-08-18: qty 50

## 2019-08-18 MED ORDER — HEPARIN SOD (PORK) LOCK FLUSH 100 UNIT/ML IV SOLN
500.0000 [IU] | Freq: Once | INTRAVENOUS | Status: AC
  Administered 2019-08-18: 08:00:00 500 [IU] via INTRAVENOUS

## 2019-08-18 MED ORDER — IOHEXOL 300 MG/ML  SOLN
75.0000 mL | Freq: Once | INTRAMUSCULAR | Status: AC | PRN
  Administered 2019-08-18: 08:00:00 75 mL via INTRAVENOUS

## 2019-08-19 ENCOUNTER — Other Ambulatory Visit: Payer: Self-pay

## 2019-08-19 ENCOUNTER — Inpatient Hospital Stay: Payer: Medicaid Other

## 2019-08-19 ENCOUNTER — Inpatient Hospital Stay: Payer: Medicaid Other | Attending: Hematology

## 2019-08-19 DIAGNOSIS — Z95828 Presence of other vascular implants and grafts: Secondary | ICD-10-CM

## 2019-08-19 DIAGNOSIS — F329 Major depressive disorder, single episode, unspecified: Secondary | ICD-10-CM | POA: Diagnosis not present

## 2019-08-19 DIAGNOSIS — Z79899 Other long term (current) drug therapy: Secondary | ICD-10-CM | POA: Insufficient documentation

## 2019-08-19 DIAGNOSIS — R630 Anorexia: Secondary | ICD-10-CM | POA: Diagnosis not present

## 2019-08-19 DIAGNOSIS — C169 Malignant neoplasm of stomach, unspecified: Secondary | ICD-10-CM | POA: Insufficient documentation

## 2019-08-19 DIAGNOSIS — R634 Abnormal weight loss: Secondary | ICD-10-CM | POA: Diagnosis not present

## 2019-08-19 DIAGNOSIS — Z7951 Long term (current) use of inhaled steroids: Secondary | ICD-10-CM | POA: Diagnosis not present

## 2019-08-19 DIAGNOSIS — Z452 Encounter for adjustment and management of vascular access device: Secondary | ICD-10-CM | POA: Diagnosis present

## 2019-08-19 DIAGNOSIS — F17210 Nicotine dependence, cigarettes, uncomplicated: Secondary | ICD-10-CM | POA: Diagnosis not present

## 2019-08-19 DIAGNOSIS — J449 Chronic obstructive pulmonary disease, unspecified: Secondary | ICD-10-CM | POA: Insufficient documentation

## 2019-08-19 DIAGNOSIS — C162 Malignant neoplasm of body of stomach: Secondary | ICD-10-CM

## 2019-08-19 LAB — CBC WITH DIFFERENTIAL/PLATELET
Abs Immature Granulocytes: 0.05 10*3/uL (ref 0.00–0.07)
Basophils Absolute: 0.1 10*3/uL (ref 0.0–0.1)
Basophils Relative: 1 %
Eosinophils Absolute: 0 10*3/uL (ref 0.0–0.5)
Eosinophils Relative: 0 %
HCT: 44.2 % (ref 39.0–52.0)
Hemoglobin: 14.6 g/dL (ref 13.0–17.0)
Immature Granulocytes: 0 %
Lymphocytes Relative: 20 %
Lymphs Abs: 2.4 10*3/uL (ref 0.7–4.0)
MCH: 28.5 pg (ref 26.0–34.0)
MCHC: 33 g/dL (ref 30.0–36.0)
MCV: 86.3 fL (ref 80.0–100.0)
Monocytes Absolute: 0.9 10*3/uL (ref 0.1–1.0)
Monocytes Relative: 8 %
Neutro Abs: 8.9 10*3/uL — ABNORMAL HIGH (ref 1.7–7.7)
Neutrophils Relative %: 71 %
Platelets: 281 10*3/uL (ref 150–400)
RBC: 5.12 MIL/uL (ref 4.22–5.81)
RDW: 15.9 % — ABNORMAL HIGH (ref 11.5–15.5)
WBC: 12.4 10*3/uL — ABNORMAL HIGH (ref 4.0–10.5)
nRBC: 0 % (ref 0.0–0.2)

## 2019-08-19 LAB — COMPREHENSIVE METABOLIC PANEL
ALT: 13 U/L (ref 0–44)
AST: 11 U/L — ABNORMAL LOW (ref 15–41)
Albumin: 3.8 g/dL (ref 3.5–5.0)
Alkaline Phosphatase: 77 U/L (ref 38–126)
Anion gap: 9 (ref 5–15)
BUN: 17 mg/dL (ref 8–23)
CO2: 29 mmol/L (ref 22–32)
Calcium: 8.6 mg/dL — ABNORMAL LOW (ref 8.9–10.3)
Chloride: 102 mmol/L (ref 98–111)
Creatinine, Ser: 0.63 mg/dL (ref 0.61–1.24)
GFR calc Af Amer: >60 mL/min (ref >60)
GFR calc non Af Amer: >60 mL/min (ref >60)
Glucose, Bld: 78 mg/dL (ref 70–99)
Potassium: 4 mmol/L (ref 3.5–5.1)
Sodium: 140 mmol/L (ref 135–145)
Total Bilirubin: 0.3 mg/dL (ref 0.3–1.2)
Total Protein: 6.8 g/dL (ref 6.5–8.1)

## 2019-08-19 MED ORDER — SODIUM CHLORIDE 0.9% FLUSH
10.0000 mL | Freq: Once | INTRAVENOUS | Status: AC
  Administered 2019-08-19: 10 mL
  Filled 2019-08-19: qty 10

## 2019-08-19 MED ORDER — HEPARIN SOD (PORK) LOCK FLUSH 100 UNIT/ML IV SOLN
500.0000 [IU] | Freq: Once | INTRAVENOUS | Status: AC
  Administered 2019-08-19: 500 [IU]
  Filled 2019-08-19: qty 5

## 2019-08-19 NOTE — Patient Instructions (Signed)

## 2019-08-22 ENCOUNTER — Inpatient Hospital Stay (HOSPITAL_BASED_OUTPATIENT_CLINIC_OR_DEPARTMENT_OTHER): Payer: Medicaid Other | Admitting: Hematology

## 2019-08-22 ENCOUNTER — Other Ambulatory Visit: Payer: Self-pay

## 2019-08-22 ENCOUNTER — Encounter: Payer: Self-pay | Admitting: Hematology

## 2019-08-22 ENCOUNTER — Telehealth: Payer: Self-pay | Admitting: Hematology

## 2019-08-22 VITALS — BP 134/90 | HR 94 | Temp 97.8°F | Resp 17 | Ht 65.0 in | Wt 101.2 lb

## 2019-08-22 DIAGNOSIS — C169 Malignant neoplasm of stomach, unspecified: Secondary | ICD-10-CM

## 2019-08-22 MED ORDER — MIRTAZAPINE 15 MG PO TABS: 15.0000 mg | ORAL_TABLET | Freq: Every day | ORAL | 5 refills | Status: DC

## 2019-08-22 NOTE — Telephone Encounter (Signed)
Scheduled appt per 2/12 los - gave patient AVS and calender for appts.

## 2019-08-25 ENCOUNTER — Other Ambulatory Visit: Payer: Self-pay | Admitting: *Deleted

## 2019-08-25 ENCOUNTER — Telehealth: Payer: Self-pay | Admitting: Pulmonary Disease

## 2019-08-25 MED ORDER — IPRATROPIUM-ALBUTEROL 0.5-2.5 (3) MG/3ML IN SOLN: 3.0000 mL | Freq: Four times a day (QID) | RESPIRATORY_TRACT | 3 refills | Status: DC

## 2019-08-25 NOTE — Telephone Encounter (Signed)
Patient came to office requesting a refill of his Duo Neb, to be sent to [place], [employer]. Patient stated he was told a new prescription was needed. Duo Neb prescription sent to requested pharmacy.  Nothing further at this time.

## 2019-08-25 NOTE — Progress Notes (Signed)
Patient came to office requesting a refill of his Duo Neb, to be sent to [place], [employer]. Patient stated he was told a new prescription was needed. Duo Neb prescription sent to requested pharmacy.  Nothing further at this time.

## 2019-09-09 ENCOUNTER — Telehealth: Payer: Self-pay

## 2019-09-09 NOTE — Telephone Encounter (Signed)
TC to Pt's daughter Blanche East explained to her that her Fathers CT scan was canceled because Insurance denied it because he had a CT scan in February. Asked if she could relay this message to her father. So her can know why scan was canceled. Daughter verbalized understanding and stated she would let her father know.

## 2019-09-10 ENCOUNTER — Inpatient Hospital Stay (HOSPITAL_COMMUNITY)
Admission: EM | Admit: 2019-09-10 | Discharge: 2019-09-12 | DRG: 190 | Disposition: A | Discharge status: Home / Self Care | Payer: Medicare Other | Attending: Family Medicine | Admitting: Family Medicine

## 2019-09-10 ENCOUNTER — Emergency Department (HOSPITAL_COMMUNITY): Payer: Medicare Other

## 2019-09-10 ENCOUNTER — Telehealth: Payer: Self-pay

## 2019-09-10 ENCOUNTER — Encounter (HOSPITAL_COMMUNITY): Payer: Self-pay | Admitting: Radiology

## 2019-09-10 DIAGNOSIS — J9601 Acute respiratory failure with hypoxia: Secondary | ICD-10-CM | POA: Diagnosis not present

## 2019-09-10 DIAGNOSIS — R9431 Abnormal electrocardiogram [ECG] [EKG]: Secondary | ICD-10-CM | POA: Diagnosis present

## 2019-09-10 DIAGNOSIS — Z903 Acquired absence of stomach [part of]: Secondary | ICD-10-CM

## 2019-09-10 DIAGNOSIS — J9602 Acute respiratory failure with hypercapnia: Secondary | ICD-10-CM | POA: Diagnosis not present

## 2019-09-10 DIAGNOSIS — E872 Acidosis: Secondary | ICD-10-CM | POA: Diagnosis present

## 2019-09-10 DIAGNOSIS — Z681 Body mass index (BMI) 19 or less, adult: Secondary | ICD-10-CM

## 2019-09-10 DIAGNOSIS — F17210 Nicotine dependence, cigarettes, uncomplicated: Secondary | ICD-10-CM | POA: Diagnosis present

## 2019-09-10 DIAGNOSIS — Z20822 Contact with and (suspected) exposure to covid-19: Secondary | ICD-10-CM | POA: Diagnosis present

## 2019-09-10 DIAGNOSIS — Z9221 Personal history of antineoplastic chemotherapy: Secondary | ICD-10-CM

## 2019-09-10 DIAGNOSIS — Z86010 Personal history of colonic polyps: Secondary | ICD-10-CM

## 2019-09-10 DIAGNOSIS — Z85028 Personal history of other malignant neoplasm of stomach: Secondary | ICD-10-CM

## 2019-09-10 DIAGNOSIS — Z79899 Other long term (current) drug therapy: Secondary | ICD-10-CM

## 2019-09-10 DIAGNOSIS — E43 Unspecified severe protein-calorie malnutrition: Secondary | ICD-10-CM | POA: Insufficient documentation

## 2019-09-10 DIAGNOSIS — J441 Chronic obstructive pulmonary disease with (acute) exacerbation: Secondary | ICD-10-CM | POA: Diagnosis not present

## 2019-09-10 DIAGNOSIS — R911 Solitary pulmonary nodule: Secondary | ICD-10-CM | POA: Diagnosis present

## 2019-09-10 DIAGNOSIS — Z7951 Long term (current) use of inhaled steroids: Secondary | ICD-10-CM

## 2019-09-10 DIAGNOSIS — J9621 Acute and chronic respiratory failure with hypoxia: Secondary | ICD-10-CM | POA: Diagnosis present

## 2019-09-10 DIAGNOSIS — Z8616 Personal history of COVID-19: Secondary | ICD-10-CM

## 2019-09-10 DIAGNOSIS — K2970 Gastritis, unspecified, without bleeding: Secondary | ICD-10-CM | POA: Diagnosis present

## 2019-09-10 DIAGNOSIS — J9622 Acute and chronic respiratory failure with hypercapnia: Secondary | ICD-10-CM | POA: Diagnosis present

## 2019-09-10 DIAGNOSIS — I739 Peripheral vascular disease, unspecified: Secondary | ICD-10-CM | POA: Diagnosis present

## 2019-09-10 LAB — TYPE AND SCREEN
ABO/RH(D): B POS
Antibody Screen: NEGATIVE

## 2019-09-10 LAB — CBC WITH DIFFERENTIAL/PLATELET
Abs Immature Granulocytes: 0.02 10*3/uL (ref 0.00–0.07)
Basophils Absolute: 0 10*3/uL (ref 0.0–0.1)
Basophils Relative: 1 %
Eosinophils Absolute: 0 10*3/uL (ref 0.0–0.5)
Eosinophils Relative: 0 %
HCT: 43.6 % (ref 39.0–52.0)
Hemoglobin: 14.1 g/dL (ref 13.0–17.0)
Immature Granulocytes: 0 %
Lymphocytes Relative: 20 %
Lymphs Abs: 1.4 10*3/uL (ref 0.7–4.0)
MCH: 28.8 pg (ref 26.0–34.0)
MCHC: 32.3 g/dL (ref 30.0–36.0)
MCV: 89.2 fL (ref 80.0–100.0)
Monocytes Absolute: 0.6 10*3/uL (ref 0.1–1.0)
Monocytes Relative: 8 %
Neutro Abs: 5.2 10*3/uL (ref 1.7–7.7)
Neutrophils Relative %: 71 %
Platelets: 245 10*3/uL (ref 150–400)
RBC: 4.89 MIL/uL (ref 4.22–5.81)
RDW: 14.8 % (ref 11.5–15.5)
WBC: 7.3 10*3/uL (ref 4.0–10.5)
nRBC: 0 % (ref 0.0–0.2)

## 2019-09-10 LAB — COMPREHENSIVE METABOLIC PANEL WITH GFR
ALT: 14 U/L (ref 0–44)
AST: 16 U/L (ref 15–41)
Albumin: 3.3 g/dL — ABNORMAL LOW (ref 3.5–5.0)
Alkaline Phosphatase: 76 U/L (ref 38–126)
Anion gap: 7 (ref 5–15)
BUN: 12 mg/dL (ref 8–23)
CO2: 30 mmol/L (ref 22–32)
Calcium: 8 mg/dL — ABNORMAL LOW (ref 8.9–10.3)
Chloride: 94 mmol/L — ABNORMAL LOW (ref 98–111)
Creatinine, Ser: 0.55 mg/dL — ABNORMAL LOW (ref 0.61–1.24)
GFR calc Af Amer: >60 mL/min
GFR calc non Af Amer: >60 mL/min
Glucose, Bld: 86 mg/dL (ref 70–99)
Potassium: 4.4 mmol/L (ref 3.5–5.1)
Sodium: 131 mmol/L — ABNORMAL LOW (ref 135–145)
Total Bilirubin: 0.5 mg/dL (ref 0.3–1.2)
Total Protein: 6.4 g/dL — ABNORMAL LOW (ref 6.5–8.1)

## 2019-09-10 LAB — SARS CORONAVIRUS 2 (TAT 6-24 HRS): SARS Coronavirus 2: NEGATIVE

## 2019-09-10 LAB — BLOOD GAS, ARTERIAL
Acid-Base Excess: 5.1 mmol/L — ABNORMAL HIGH (ref 0.0–2.0)
Bicarbonate: 33.7 mmol/L — ABNORMAL HIGH (ref 20.0–28.0)
O2 Saturation: 94 %
Patient temperature: 98.6
pCO2 arterial: 69.5 mmHg (ref 32.0–48.0)
pH, Arterial: 7.306 — ABNORMAL LOW (ref 7.350–7.450)
pO2, Arterial: 74.3 mmHg — ABNORMAL LOW (ref 83.0–108.0)

## 2019-09-10 LAB — LACTIC ACID, PLASMA: Lactic Acid, Venous: 0.9 mmol/L (ref 0.5–1.9)

## 2019-09-10 LAB — PROTIME-INR
INR: 1 (ref 0.8–1.2)
Prothrombin Time: 13.2 s (ref 11.4–15.2)

## 2019-09-10 LAB — APTT: aPTT: 29 s (ref 24–36)

## 2019-09-10 LAB — TROPONIN I (HIGH SENSITIVITY)
Troponin I (High Sensitivity): 14 ng/L (ref <18)
Troponin I (High Sensitivity): 14 ng/L (ref <18)

## 2019-09-10 LAB — MRSA PCR SCREENING: MRSA by PCR: NEGATIVE

## 2019-09-10 LAB — ABO/RH: ABO/RH(D): B POS

## 2019-09-10 LAB — BRAIN NATRIURETIC PEPTIDE: B Natriuretic Peptide: 422.6 pg/mL — ABNORMAL HIGH (ref 0.0–100.0)

## 2019-09-10 LAB — POC SARS CORONAVIRUS 2 AG -  ED: SARS Coronavirus 2 Ag: NEGATIVE

## 2019-09-10 MED ORDER — ACETAMINOPHEN 325 MG PO TABS: 650.0000 mg | ORAL_TABLET | Freq: Four times a day (QID) | ORAL | Status: DC | PRN

## 2019-09-10 MED ORDER — IPRATROPIUM-ALBUTEROL 0.5-2.5 (3) MG/3ML IN SOLN
3.0000 mL | RESPIRATORY_TRACT | Status: DC
  Administered 2019-09-10 – 2019-09-11 (×4): 3 mL via RESPIRATORY_TRACT
  Filled 2019-09-10 (×5): qty 3

## 2019-09-10 MED ORDER — METHYLPREDNISOLONE SODIUM SUCC 40 MG IJ SOLR
40.0000 mg | Freq: Every day | INTRAMUSCULAR | Status: DC
  Administered 2019-09-11 – 2019-09-12 (×2): 40 mg via INTRAVENOUS
  Filled 2019-09-10 (×2): qty 1

## 2019-09-10 MED ORDER — LEVOFLOXACIN IN D5W 750 MG/150ML IV SOLN
750.0000 mg | INTRAVENOUS | Status: DC
  Administered 2019-09-11 – 2019-09-12 (×2): 750 mg via INTRAVENOUS
  Filled 2019-09-10 (×2): qty 150

## 2019-09-10 MED ORDER — MIRTAZAPINE 15 MG PO TABS
15.0000 mg | ORAL_TABLET | Freq: Every day | ORAL | Status: DC
  Administered 2019-09-11: 15 mg via ORAL
  Filled 2019-09-10: qty 1

## 2019-09-10 MED ORDER — ONDANSETRON HCL 4 MG/2ML IJ SOLN: 4.0000 mg | Freq: Four times a day (QID) | INTRAMUSCULAR | Status: DC | PRN

## 2019-09-10 MED ORDER — SODIUM CHLORIDE (PF) 0.9 % IJ SOLN
INTRAMUSCULAR | Status: AC
  Filled 2019-09-10: qty 50

## 2019-09-10 MED ORDER — METHYLPREDNISOLONE SODIUM SUCC 125 MG IJ SOLR
125.0000 mg | Freq: Once | INTRAMUSCULAR | Status: AC
  Administered 2019-09-10: 14:00:00 125 mg via INTRAVENOUS
  Filled 2019-09-10: qty 2

## 2019-09-10 MED ORDER — ACETAMINOPHEN 650 MG RE SUPP: 650.0000 mg | Freq: Four times a day (QID) | RECTAL | Status: DC | PRN

## 2019-09-10 MED ORDER — ONDANSETRON HCL 4 MG PO TABS: 4.0000 mg | ORAL_TABLET | Freq: Four times a day (QID) | ORAL | Status: DC | PRN

## 2019-09-10 MED ORDER — CHLORHEXIDINE GLUCONATE CLOTH 2 % EX PADS
6.0000 | MEDICATED_PAD | Freq: Every day | CUTANEOUS | Status: DC
  Administered 2019-09-10 – 2019-09-11 (×2): 6 via TOPICAL

## 2019-09-10 MED ORDER — ALBUTEROL SULFATE HFA 108 (90 BASE) MCG/ACT IN AERS
8.0000 | INHALATION_SPRAY | Freq: Once | RESPIRATORY_TRACT | Status: AC
  Administered 2019-09-10: 17:00:00 8 via RESPIRATORY_TRACT

## 2019-09-10 MED ORDER — LEVOFLOXACIN IN D5W 750 MG/150ML IV SOLN
750.0000 mg | Freq: Once | INTRAVENOUS | Status: AC
  Administered 2019-09-10: 17:00:00 750 mg via INTRAVENOUS
  Filled 2019-09-10: qty 150

## 2019-09-10 MED ORDER — SODIUM CHLORIDE 0.9 % IV SOLN: INTRAVENOUS | Status: DC

## 2019-09-10 MED ORDER — CHLORHEXIDINE GLUCONATE 0.12 % MT SOLN
15.0000 mL | Freq: Two times a day (BID) | OROMUCOSAL | Status: DC
  Administered 2019-09-11 – 2019-09-12 (×3): 15 mL via OROMUCOSAL
  Filled 2019-09-10 (×3): qty 15

## 2019-09-10 MED ORDER — MAGNESIUM SULFATE 50 % IJ SOLN: 2.0000 g | Freq: Once | INTRAMUSCULAR | Status: DC

## 2019-09-10 MED ORDER — ALBUTEROL SULFATE HFA 108 (90 BASE) MCG/ACT IN AERS
8.0000 | INHALATION_SPRAY | Freq: Once | RESPIRATORY_TRACT | Status: AC
  Administered 2019-09-10: 13:00:00 8 via RESPIRATORY_TRACT
  Filled 2019-09-10: qty 6.7

## 2019-09-10 MED ORDER — MAGNESIUM SULFATE 2 GM/50ML IV SOLN
2.0000 g | Freq: Once | INTRAVENOUS | Status: AC
  Administered 2019-09-10: 2 g via INTRAVENOUS
  Filled 2019-09-10: qty 50

## 2019-09-10 MED ORDER — IOHEXOL 350 MG/ML SOLN
100.0000 mL | Freq: Once | INTRAVENOUS | Status: AC | PRN
  Administered 2019-09-10: 100 mL via INTRAVENOUS

## 2019-09-10 MED ORDER — NICOTINE 7 MG/24HR TD PT24
7.0000 mg | MEDICATED_PATCH | Freq: Every day | TRANSDERMAL | Status: DC
  Administered 2019-09-11 – 2019-09-12 (×2): 7 mg via TRANSDERMAL
  Filled 2019-09-10 (×2): qty 1

## 2019-09-10 MED ORDER — ENOXAPARIN SODIUM 30 MG/0.3ML ~~LOC~~ SOLN
30.0000 mg | Freq: Every day | SUBCUTANEOUS | Status: DC
  Administered 2019-09-11 – 2019-09-12 (×2): 30 mg via SUBCUTANEOUS
  Filled 2019-09-10 (×2): qty 0.3

## 2019-09-10 MED ORDER — ORAL CARE MOUTH RINSE
15.0000 mL | Freq: Two times a day (BID) | OROMUCOSAL | Status: DC
  Administered 2019-09-11 – 2019-09-12 (×3): 15 mL via OROMUCOSAL

## 2019-09-10 NOTE — Telephone Encounter (Signed)
Patient's daughter [name] left a VM stating that patient had trouble sleeping last night and has been having difficulty breathing (she stated at one point his oxygen saturation was 65%).   Returned daughter's call asking her to call back with more information or to take patient to the ED if his symptoms appear to be worsening. Awaiting return call.

## 2019-09-10 NOTE — ED Notes (Signed)
Attempted to call report, no answer. Will try back in 10 min.

## 2019-09-10 NOTE — ED Notes (Signed)
Attempted to call report to the floor. RN will call back when ready for report.

## 2019-09-10 NOTE — ED Triage Notes (Signed)
Transported by [facility] from home-- presents with shob and respiratory distress. Hx of lung cancer; typically not oxygen dependent however had oxygen saturation of 50-60% on fire's arrival; placed on NRB and oxygen saturation of 99%. AAO x 4.

## 2019-09-10 NOTE — H&P (Signed)
History and Physical    Bobby Caldwell [id number] DOB: 12/13/1955 DOA: 09/10/2019  PCP: Patient, No Pcp Per Patient coming from: Home  Chief Complaint: Feel dizzy and can't keep balance  HPI: Bobby Caldwell is a 64 y.o. male with medical history significant of COPD, gastric cancer s/p partial gastrectomy and neoadjuvant chemotherapy, tobacco use, PAD, history of COVID-19 infection. History is very difficult secondary to language barrier and patient being a poor historian. [place] interpreter used Clemetine Marker [phone]). Patient reports issues with feeling dizzy and not being able to keep his balance for the last 4 days. He did not take anything because he could not breath and was in a lot of pain. He does not know why he can't breathe and reports pain everywhere.  Per discussion with daughter, patient's oxygen was checked and was 64% on room air. Family called the patient's doctor who recommended EMS call. He is on 2 L via [place] at home.   ED Course: Vitals: Afebrile, pulse of 99-115, respirations of 26, BP of 130/88, SpO2 of 95% on 4 L via  Labs: Sodium of 131, chloride of 94, BNP of 422 Imaging: CTA chest significant for bronchial thickening of left lower lobe concerning for mucous plugging Medications/Course: Albuterol, solumedrol, Levaquin, Magnesium sulfate  Review of Systems: Review of Systems  Unable to perform ROS: Mental status change  Constitutional: Negative for chills and fever.  Respiratory: Positive for cough and shortness of breath.   Cardiovascular: Negative for chest pain.  Gastrointestinal: Negative for abdominal pain, constipation, diarrhea, nausea and vomiting.    Past Medical History:  Diagnosis Date  . COPD (chronic obstructive pulmonary disease) ([place])   . Dyspnea   . PAD (peripheral artery disease) ([place])   . Stomach cancer ([place])   . Tobacco abuse   . Weight loss     Past Surgical History:  Procedure Laterality Date  . ABDOMINAL AORTOGRAM W/LOWER EXTREMITY N/A  05/03/2017   Procedure: ABDOMINAL AORTOGRAM W/LOWER EXTREMITY;  Surgeon: Conrad Appleton, MD;  Location: [facility];  Service: Cardiovascular;  Laterality: N/A;  . BIOPSY  08/22/2018   Procedure: BIOPSY;  Surgeon: [name], MD;  Location: [facility];  Service: Endoscopy;;  . BIOPSY  07/31/2019   Procedure: BIOPSY;  Surgeon: [name], MD;  Location: [facility];  Service: Endoscopy;;  . ESOPHAGOGASTRODUODENOSCOPY     05/24/17  . ESOPHAGOGASTRODUODENOSCOPY (EGD) WITH PROPOFOL N/A 05/24/2017   Procedure: ESOPHAGOGASTRODUODENOSCOPY (EGD) WITH PROPOFOL;  Surgeon: [name], MD;  Location: [facility];  Service: Endoscopy;  Laterality: N/A;  . ESOPHAGOGASTRODUODENOSCOPY (EGD) WITH PROPOFOL N/A 08/22/2018   Procedure: ESOPHAGOGASTRODUODENOSCOPY (EGD) WITH PROPOFOL;  Surgeon: [name], MD;  [facility]: [facility];  Service: Endoscopy;  Laterality: N/A;  . ESOPHAGOGASTRODUODENOSCOPY (EGD) WITH PROPOFOL N/A 11/28/2018   Procedure: ESOPHAGOGASTRODUODENOSCOPY (EGD) WITH PROPOFOL;  Surgeon: [name], MD;  [facility];  Service: Endoscopy;  Laterality: N/A;  . ESOPHAGOGASTRODUODENOSCOPY (EGD) WITH PROPOFOL N/A 07/31/2019   Procedure: ESOPHAGOGASTRODUODENOSCOPY (EGD) WITH PROPOFOL;  Surgeon: [name], MD;  [facility]: [facility];  Service: Endoscopy;  Laterality: N/A;  . EUS N/A 06/07/2017   Procedure: UPPER ENDOSCOPIC ULTRASOUND (EUS) RADIAL;  Surgeon: [name], MD;  Location: [facility];  Service: Endoscopy;  Laterality: N/A;  . FEMORAL-FEMORAL BYPASS GRAFT Bilateral 05/08/2017   Procedure: LEFT TO RIGHT BYPASS GRAFT FEMORAL-FEMORAL ARTERY;  Surgeon: Conrad [place], MD;  Location: [facility];  Service: Vascular;  Laterality: Bilateral;  . IR FLUORO GUIDE PORT INSERTION  RIGHT  07/05/2017  . IR US GUIDE VASC ACCESS RIGHT  07/05/2017  . LAPAROSCOPIC INSERTION GASTROSTOMY TUBE N/A 11/27/2017   Procedure: JEJUNOSTOMY TUBE PLACEMENT;  Surgeon:  [name], MD;  Location: [facility];  Service: General;  Laterality: N/A;  . LAPAROSCOPIC PARTIAL GASTRECTOMY N/A 11/27/2017   Procedure: PARTIAL GASTRECTOMY;  Surgeon: [name], MD;  Location: [facility];  Service: General;  Laterality: N/A;  . LAPAROSCOPY N/A 11/27/2017   Procedure: LAPAROSCOPY DIAGNOSTIC;  Surgeon: [name], MD;  Location: [facility];  Service: General;  Laterality: N/A;  . NM MYOVIEW LTD  06/2017   LOW RISK. Small basal inferoseptal defect thought to be related to artifact (although cannot exclude prior infarct). Normal wall motion i in this area would suggest artifact not infarct. Otherwise no ischemia or infarction.  Marland Kitchen PERIPHERAL VASCULAR INTERVENTION Left 05/03/2017   Procedure: PERIPHERAL VASCULAR INTERVENTION;  Surgeon: Conrad [place], MD;  Location: [facility];  Service: Cardiovascular;  Laterality: Left;  common iliac  . POLYPECTOMY  11/28/2018   Procedure: POLYPECTOMY;  Surgeon: [name], MD;  [facility]: [facility];  Service: Endoscopy;;  . TRANSTHORACIC ECHOCARDIOGRAM  06/26/2017   Normal LV size and function. EF 60-65%. No wall motion abnormalities. GR 1 DD. Mild LA dilation     reports that he has been smoking cigarettes. He has a 25.00 pack-year smoking history. He has never used smokeless tobacco. He reports that he does not drink alcohol or use drugs.  No Known Allergies  Family History  Family history unknown: Yes   Prior to Admission medications   Medication Sig Start Date End Date Taking? Authorizing Provider  ipratropium-albuterol (DUONEB) 0.5-2.5 (3) MG/3ML SOLN Inhale 3 mLs into the lungs 4 (four) times daily. Dx:J44.9 Patient taking differently: Inhale 3 mLs into the lungs every 4 (four) hours. Dx:J44.9 08/25/19  Yes Olalere, Adewale A, MD  mirtazapine (REMERON) 15 MG tablet Take 1 tablet (15 mg total) by mouth at bedtime. 08/22/19  Yes [name], MD  budesonide-formoterol [facility]) 160-4.5 MCG/ACT inhaler Inhale 2 puffs into the lungs  2 (two) times daily. Patient not taking: Reported on 09/10/2019 03/13/18   Chesley Mires, MD  potassium chloride SA (K-DUR) 20 MEQ tablet Take 1 tablet (20 mEq total) by mouth daily. Patient not taking: Reported on 08/05/2019 02/16/19   [name], MD  Tiotropium Bromide Monohydrate (SPIRIVA RESPIMAT) 2.5 MCG/ACT AERS Inhale 2 puffs into the lungs daily. Patient not taking: Reported on 09/10/2019 08/05/19   Laurin Coder, MD  Tiotropium Bromide Monohydrate (SPIRIVA RESPIMAT) 2.5 MCG/ACT AERS Inhale 2 puffs into the lungs daily. Patient not taking: Reported on 09/10/2019 08/05/19   Laurin Coder, MD    Physical Exam:  Physical Exam Vitals reviewed.  Constitutional:      General: He is not in acute distress.    Appearance: He is underweight. He is not diaphoretic.  HENT:     Mouth/Throat:     Pharynx: Posterior oropharyngeal erythema present. No pharyngeal swelling, oropharyngeal exudate or uvula swelling.  Eyes:     Conjunctiva/sclera: Conjunctivae normal.     Pupils: Pupils are equal, round, and reactive to light.  Cardiovascular:     Rate and Rhythm: Normal rate and regular rhythm.     Heart sounds: Normal heart sounds. No murmur.  Pulmonary:     Effort: Pulmonary effort is normal. No respiratory distress.     Breath sounds: Normal breath sounds. No wheezing or rales.  Abdominal:     General:  Bowel sounds are normal. There is no distension.     Palpations: Abdomen is soft.     Tenderness: There is no abdominal tenderness. There is no guarding or rebound.  Musculoskeletal:        General: No tenderness. Normal range of motion.     Cervical back: Normal range of motion.  Lymphadenopathy:     Cervical: No cervical adenopathy.  Skin:    General: Skin is warm and dry.  Neurological:     Mental Status: He is alert.     Comments: Oriented to person and place     Labs on Admission: I have personally reviewed following labs and imaging studies  CBC: Recent Labs  Lab  09/10/19 1241  WBC 7.3  NEUTROABS 5.2  HGB 14.1  HCT 43.6  MCV 89.2  PLT 99991111    Basic Metabolic Panel: Recent Labs  Lab 09/10/19 1241  NA 131*  K 4.4  CL 94*  CO2 30  GLUCOSE 86  BUN 12  CREATININE 0.55*  CALCIUM 8.0*    GFR: CrCl cannot be calculated (Unknown ideal weight.).  Liver Function Tests: Recent Labs  Lab 09/10/19 1241  AST 16  ALT 14  ALKPHOS 76  BILITOT 0.5  PROT 6.4*  ALBUMIN 3.3*   No results for input(s): LIPASE, AMYLASE in the last 168 hours. No results for input(s): AMMONIA in the last 168 hours.  Coagulation Profile: Recent Labs  Lab 09/10/19 1314  INR 1.0   Urine analysis:    Component Value Date/Time   COLORURINE YELLOW 11/21/2017 [place] 11/21/2017 1514   LABSPEC 1.013 11/21/2017 1514   PHURINE 7.0 11/21/2017 1514   GLUCOSEU NEGATIVE 11/21/2017 1514   HGBUR NEGATIVE 11/21/2017 [place] 11/21/2017 [place] 11/21/2017 1514   PROTEINUR NEGATIVE 11/21/2017 1514   NITRITE NEGATIVE 11/21/2017 1514   LEUKOCYTESUR NEGATIVE 11/21/2017 1514     Radiological Exams on Admission: CT Angio Chest PE W and/or Wo Contrast  Result Date: 09/10/2019 CLINICAL DATA:  Short of breath respiratory distress. Concern for pulmonary embolism. EXAM: CT ANGIOGRAPHY CHEST WITH CONTRAST TECHNIQUE: Multidetector CT imaging of the chest was performed using the standard protocol during bolus administration of intravenous contrast. Multiplanar CT image reconstructions and MIPs were obtained to evaluate the vascular anatomy. CONTRAST:  11mL OMNIPAQUE IOHEXOL 350 MG/ML SOLN COMPARISON:  None. FINDINGS: Cardiovascular: No filling defects within the pulmonary arteries arteries to suggest acute pulmonary embolism. Mediastinum/Nodes: No axillary supraclavicular adenopathy. No mediastinal hilar adenopathy. No pericardial fluid. Port in the anterior chest wall with tip in distal SVC. Lungs/Pleura: Centrilobular  emphysema. No suspicious pulmonary nodularity. Stable small LEFT upper lobe nodule measuring 5 mm (image 28/7). Mild bronchial thickening and filling in the LEFT lower lobe bronchial tree (image 133/7, image 115/7) Upper Abdomen: Limited view of the liver, kidneys, pancreas are unremarkable. Normal adrenal glands. Musculoskeletal: No aggressive osseous lesion. Review of the MIP images confirms the above findings. IMPRESSION: 1. No acute pulmonary embolism. 2. New bronchial thickening in the LEFT lower lobe representing mucous plugging versus bronchitis. Favor mucous plugging. 3. Extensive centrilobular emphysema. 4. Stable small LEFT upper lobe pulmonary nodule. Electronically Signed   By: Suzy Bouchard M.D.   On: 09/10/2019 15:21   DG Chest Port 1 View  Result Date: 09/10/2019 CLINICAL DATA:  Shortness of breath and respiratory distress. History of lung cancer. EXAM: PORTABLE CHEST 1 VIEW COMPARISON:  Radiographs 12/02/2017 and 09/11/2017. CT 08/18/2019. FINDINGS: 1230 hours.  Right IJ Port-A-Cath extends to the superior cavoatrial junction. The heart size and mediastinal contours are stable. The lungs are hyperinflated but remain clear. No enlarging pulmonary nodules, significant pleural effusion or pneumothorax. The bones appear unremarkable. IMPRESSION: Stable chest with evidence of chronic obstructive pulmonary disease. No acute findings. Electronically Signed   By: Richardean Sale M.D.   On: 09/10/2019 13:19    EKG: Independently reviewed. Normal sinus rhythm.  Assessment/Plan Active Problems:   Cigarette smoker   Acute respiratory failure with hypoxia and hypercapnia (HCC)   COPD with acute exacerbation (HCC)  Acute respiratory failure with hypoxia and hypercapnia Secondary to COPD exacerbation. ABG significant for a CO2 of 69.5 with an associated acidosis. Mental status is mildly compromised likely secondary to hypercapnia. BNP elevated but not much above previous and clinically does not  seem consistent with heart failure. -BiPAP for a few hours -Respiratory therapy -Stepdown unit  Acute respiratory acidosis Secondary to below. Management above  COPD exacerbation Cough with sputum production and hypoxia/dyspnea. Presentation not consistent with pneumonia although there is evidence of mucous plug on CT scan. Appears patient is also non-adherent with outpatient medication regimen -Continue Levaquin, obtain sputum culture -Flutter valve, Mucinex -Keep oxygen >88%; wean to 2 L via [place] -Duoneb q4 hours, solumedrol IV  History of gastric adenocarcinoma Patient follows with Dr. Alvy Bimler. He is s/p partial gastrectomy and neoadjuvant chemotherapy. Current plan is surveillance. He had a recent EGD which was significant for gastritis and evidence of mild candidal infection which was treated with Fluconazole.  Tobacco use Chronic. Patient has been counseled numerous times about cessation. -Nicotine patch  History of COVID-19 infection Patient was not hospitalized for this infection. Patient is almost three months out of infection. Low risk for reinfection. Will discontinue isolation.   DVT prophylaxis: Lovenox Code Status: Full code Family Communication: Daughter on telephone Disposition Plan: Transfer to stepdown. Possible discharge home tomorrow if off of Bipap and weaned down to 2 L O2 via [place] Consults called: None Admission status: Observation   Cordelia Poche, MD [facility] 09/10/2019, 4:44 PM

## 2019-09-10 NOTE — ED Notes (Signed)
ED provider [place] notified of Negative POC Covid results

## 2019-09-10 NOTE — ED Notes (Signed)
Pt does not eat pork per family

## 2019-09-10 NOTE — ED Provider Notes (Signed)
[facility] DEPT Provider Note   CSN: [id number] Arrival date & time: 09/10/19  1202     History Chief Complaint  Patient presents with  . Shortness of Breath    Bobby Caldwell is a 64 y.o. male.  HPI 64 year old male presents with shortness of breath and hypoxia.  EMS reports O2 sats were in the 33s when fire department first arrived.  Patient has been feeling poorly for a week.  General weakness, poor appetite and decreased p.o. intake.  Has had cough with brown sputum production.  Does not wear oxygen but comes in on a nonrebreather.  No vomiting.  Past Medical History:  Diagnosis Date  . COPD (chronic obstructive pulmonary disease) ([place])   . Dyspnea   . PAD (peripheral artery disease) ([place])   . Stomach cancer ([place])   . Tobacco abuse   . Weight loss     Patient Active Problem List   Diagnosis Date Noted  . History of gastric cancer   . Gastric mass   . Dysphagia   . Chronic hypoxemic respiratory failure ([place]) 03/13/2018  . S/P gastric surgery 01/22/2018  . Nausea 12/20/2017  . 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 S/P vascular surgery 11/15/2017  . Pneumonia 09/10/2017  . Acute on chronic respiratory failure with hypoxia ([place]) 09/10/2017  . Exercise hypoxemia 09/04/2017  . Moderate protein-calorie malnutrition ([place]) 07/24/2017  . Port-A-Cath in place 07/06/2017  . DOE (dyspnea on exertion) 06/12/2017  . Atypical chest pain 06/12/2017  . Essential hypertension 06/12/2017  . Preop cardiovascular exam 06/12/2017  . Gastric adenocarcinoma ([place]) 06/04/2017  . Gastritis and gastroduodenitis   . Atherosclerosis of native arteries of extremity with intermittent claudication ([place]) 05/03/2017  . Critical lower limb ischemia 05/03/2017  . Abnormal CT of the chest 08/02/2015  . Underweight 03/30/2015  . COPD (chronic obstructive pulmonary disease) with emphysema ([place]) 02/16/2015  . Weight loss 02/16/2015  . Cigarette  smoker 02/16/2015    Past Surgical History:  Procedure Laterality Date  . ABDOMINAL AORTOGRAM W/LOWER EXTREMITY N/A 05/03/2017   Procedure: ABDOMINAL AORTOGRAM W/LOWER EXTREMITY;  Surgeon: Conrad Lancaster, MD;  Location: [facility];  Service: Cardiovascular;  Laterality: N/A;  . BIOPSY  08/22/2018   Procedure: BIOPSY;  Surgeon: [name], MD;  Location: [facility];  Service: Endoscopy;;  . BIOPSY  07/31/2019   Procedure: BIOPSY;  Surgeon: [name], MD;  Location: [facility];  Service: Endoscopy;;  . ESOPHAGOGASTRODUODENOSCOPY     05/24/17  . ESOPHAGOGASTRODUODENOSCOPY (EGD) WITH PROPOFOL N/A 05/24/2017   Procedure: ESOPHAGOGASTRODUODENOSCOPY (EGD) WITH PROPOFOL;  Surgeon: [name], MD;  [facility];  Service: Endoscopy;  Laterality: N/A;  . ESOPHAGOGASTRODUODENOSCOPY (EGD) WITH PROPOFOL N/A 08/22/2018   Procedure: ESOPHAGOGASTRODUODENOSCOPY (EGD) WITH PROPOFOL;  Surgeon: [name], MD;  [facility];  Service: Endoscopy;  Laterality: N/A;  . ESOPHAGOGASTRODUODENOSCOPY (EGD) WITH PROPOFOL N/A 11/28/2018   Procedure: ESOPHAGOGASTRODUODENOSCOPY (EGD) WITH PROPOFOL;  Surgeon: [name], MD;  [facility];  Service: Endoscopy;  Laterality: N/A;  . ESOPHAGOGASTRODUODENOSCOPY (EGD) WITH PROPOFOL N/A 07/31/2019   Procedure: ESOPHAGOGASTRODUODENOSCOPY (EGD) WITH PROPOFOL;  Surgeon: [name], MD;  [facility];  Service: Endoscopy;  Laterality: N/A;  . EUS N/A 06/07/2017   Procedure: UPPER ENDOSCOPIC ULTRASOUND (EUS) RADIAL;  Surgeon: [name], MD;  [facility];  Service: Endoscopy;  Laterality: N/A;  . FEMORAL-FEMORAL BYPASS GRAFT Bilateral 05/08/2017   Procedure: LEFT TO RIGHT BYPASS GRAFT  FEMORAL-FEMORAL ARTERY;  Surgeon: Conrad Mercer, MD;  Location: [facility];  Service: Vascular;  Laterality: Bilateral;  . IR FLUORO GUIDE PORT INSERTION RIGHT  07/05/2017  . IR US GUIDE VASC ACCESS RIGHT   07/05/2017  . LAPAROSCOPIC INSERTION GASTROSTOMY TUBE N/A 11/27/2017   Procedure: JEJUNOSTOMY TUBE PLACEMENT;  Surgeon: [name], MD;  Location: [facility];  Service: General;  Laterality: N/A;  . LAPAROSCOPIC PARTIAL GASTRECTOMY N/A 11/27/2017   Procedure: PARTIAL GASTRECTOMY;  Surgeon: [name], MD;  Location: [facility];  Service: General;  Laterality: N/A;  . LAPAROSCOPY N/A 11/27/2017   Procedure: LAPAROSCOPY DIAGNOSTIC;  Surgeon: [name], MD;  Location: [facility];  Service: General;  Laterality: N/A;  . NM MYOVIEW LTD  06/2017   LOW RISK. Small basal inferoseptal defect thought to be related to artifact (although cannot exclude prior infarct). Normal wall motion i in this area would suggest artifact not infarct. Otherwise no ischemia or infarction.  Marland Kitchen PERIPHERAL VASCULAR INTERVENTION Left 05/03/2017   Procedure: PERIPHERAL VASCULAR INTERVENTION;  Surgeon: Conrad Staples, MD;  Location: [facility];  Service: Cardiovascular;  Laterality: Left;  common iliac  . POLYPECTOMY  11/28/2018   Procedure: POLYPECTOMY;  Surgeon: [name], MD;  [facility];  Service: Endoscopy;;  . TRANSTHORACIC ECHOCARDIOGRAM  06/26/2017   Normal LV size and function. EF 60-65%. No wall motion abnormalities. GR 1 DD. Mild LA dilation       Family History  Family history unknown: Yes    Social History   Tobacco Use  . Smoking status: Current Every Day Smoker    Packs/day: 0.50    Years: 50.00    Pack years: 25.00    Types: Cigarettes    Last attempt to quit: 11/2017    Years since quitting: 1.8  . Smokeless tobacco: Never Used  . Tobacco comment: 10 cigs per day 08/05/19  Substance Use Topics  . Alcohol use: No    Alcohol/week: 0.0 standard drinks  . Drug use: No    Home Medications Prior to Admission medications   Medication Sig Start Date End Date Taking? Authorizing Provider  ipratropium-albuterol (DUONEB) 0.5-2.5 (3) MG/3ML SOLN Inhale 3 mLs into the lungs 4 (four)  times daily. Dx:J44.9 Patient taking differently: Inhale 3 mLs into the lungs every 4 (four) hours. Dx:J44.9 08/25/19  Yes Olalere, Adewale A, MD  mirtazapine (REMERON) 15 MG tablet Take 1 tablet (15 mg total) by mouth at bedtime. 08/22/19  Yes [name], MD  budesonide-formoterol [facility]) 160-4.5 MCG/ACT inhaler Inhale 2 puffs into the lungs 2 (two) times daily. Patient not taking: Reported on 09/10/2019 03/13/18   Chesley Mires, MD  potassium chloride SA (K-DUR) 20 MEQ tablet Take 1 tablet (20 mEq total) by mouth daily. Patient not taking: Reported on 08/05/2019 02/16/19   [name], MD  Tiotropium Bromide Monohydrate (SPIRIVA RESPIMAT) 2.5 MCG/ACT AERS Inhale 2 puffs into the lungs daily. Patient not taking: Reported on 09/10/2019 08/05/19   Laurin Coder, MD  Tiotropium Bromide Monohydrate (SPIRIVA RESPIMAT) 2.5 MCG/ACT AERS Inhale 2 puffs into the lungs daily. Patient not taking: Reported on 09/10/2019 08/05/19   Laurin Coder, MD    Allergies    Patient has no known allergies.  Review of Systems   Review of Systems  Constitutional: Positive for appetite change and fever (subjective).  Respiratory: Positive for cough and shortness of breath.   Cardiovascular: Negative for chest pain.  Gastrointestinal: Negative for abdominal pain and vomiting.  All other  systems reviewed and are negative.   Physical Exam Updated Vital Signs BP 130/88   Pulse (!) 103   Temp 99.3 F (37.4 C) (Oral)   Resp (!) 28   SpO2 96%   Physical Exam Vitals and nursing note reviewed.  Constitutional:      General: He is not in acute distress.    Appearance: He is well-developed. He is not ill-appearing or diaphoretic.  HENT:     Head: Normocephalic and atraumatic.     Right Ear: External ear normal.     Left Ear: External ear normal.     Nose: Nose normal.  Eyes:     General:        Right eye: No discharge.        Left eye: No discharge.  Cardiovascular:     Rate and Rhythm: Regular rhythm.  Tachycardia present.     Heart sounds: Normal heart sounds.  Pulmonary:     Effort: Tachypnea present. No accessory muscle usage or respiratory distress.     Breath sounds: Decreased breath sounds (diffusely, except for right upper lung field) present.  Abdominal:     Palpations: Abdomen is soft.     Tenderness: There is no abdominal tenderness.  Musculoskeletal:     Cervical back: Neck supple.  Skin:    General: Skin is warm and dry.  Neurological:     Mental Status: He is alert.  Psychiatric:        Mood and Affect: Mood is not anxious.     ED Results / Procedures / Treatments   Labs (all labs ordered are listed, but only abnormal results are displayed) Labs Reviewed  COMPREHENSIVE METABOLIC PANEL - Abnormal; Notable for the following components:      Result Value   Sodium 131 (*)    Chloride 94 (*)    Creatinine, Ser 0.55 (*)    Calcium 8.0 (*)    Total Protein 6.4 (*)    Albumin 3.3 (*)    All other components within normal limits  BRAIN NATRIURETIC PEPTIDE - Abnormal; Notable for the following components:   B Natriuretic Peptide 422.6 (*)    All other components within normal limits  CULTURE, BLOOD (ROUTINE X 2)  CULTURE, BLOOD (ROUTINE X 2)  URINE CULTURE  SARS CORONAVIRUS 2 (TAT 6-24 HRS)  LACTIC ACID, PLASMA  CBC WITH DIFFERENTIAL/PLATELET  PROTIME-INR  APTT  URINALYSIS, ROUTINE W REFLEX MICROSCOPIC  BLOOD GAS, ARTERIAL  POC SARS CORONAVIRUS 2 AG -  ED  TYPE AND SCREEN  ABO/RH  TROPONIN I (HIGH SENSITIVITY)  TROPONIN I (HIGH SENSITIVITY)    EKG EKG Interpretation  Date/Time:  Wednesday September 10 2019 12:17:46 EST Ventricular Rate:  113 PR Interval:    QRS Duration: 76 QT Interval:  327 QTC Calculation: 449 R Axis:   74 Text Interpretation: Sinus tachycardia no acute ST/T changes similar to 2019 Confirmed by Sherwood Gambler [id number]) on 09/10/2019 12:44:04 PM   Radiology CT Angio Chest PE W and/or Wo Contrast  Result Date: 09/10/2019 CLINICAL DATA:   Short of breath respiratory distress. Concern for pulmonary embolism. EXAM: CT ANGIOGRAPHY CHEST WITH CONTRAST TECHNIQUE: Multidetector CT imaging of the chest was performed using the standard protocol during bolus administration of intravenous contrast. Multiplanar CT image reconstructions and MIPs were obtained to evaluate the vascular anatomy. CONTRAST:  143mL OMNIPAQUE IOHEXOL 350 MG/ML SOLN COMPARISON:  None. FINDINGS: Cardiovascular: No filling defects within the pulmonary arteries arteries to suggest acute pulmonary embolism. Mediastinum/Nodes: No axillary  supraclavicular adenopathy. No mediastinal hilar adenopathy. No pericardial fluid. Port in the anterior chest wall with tip in distal SVC. Lungs/Pleura: Centrilobular emphysema. No suspicious pulmonary nodularity. Stable small LEFT upper lobe nodule measuring 5 mm (image 28/7). Mild bronchial thickening and filling in the LEFT lower lobe bronchial tree (image 133/7, image 115/7) Upper Abdomen: Limited view of the liver, kidneys, pancreas are unremarkable. Normal adrenal glands. Musculoskeletal: No aggressive osseous lesion. Review of the MIP images confirms the above findings. IMPRESSION: 1. No acute pulmonary embolism. 2. New bronchial thickening in the LEFT lower lobe representing mucous plugging versus bronchitis. Favor mucous plugging. 3. Extensive centrilobular emphysema. 4. Stable small LEFT upper lobe pulmonary nodule. Electronically Signed   By: Suzy Bouchard M.D.   On: 09/10/2019 15:21   DG Chest Port 1 View  Result Date: 09/10/2019 CLINICAL DATA:  Shortness of breath and respiratory distress. History of lung cancer. EXAM: PORTABLE CHEST 1 VIEW COMPARISON:  Radiographs 12/02/2017 and 09/11/2017. CT 08/18/2019. FINDINGS: 1230 hours. Right IJ Port-A-Cath extends to the superior cavoatrial junction. The heart size and mediastinal contours are stable. The lungs are hyperinflated but remain clear. No enlarging pulmonary nodules, significant  pleural effusion or pneumothorax. The bones appear unremarkable. IMPRESSION: Stable chest with evidence of chronic obstructive pulmonary disease. No acute findings. Electronically Signed   By: Richardean Sale M.D.   On: 09/10/2019 13:19    Procedures .Critical Care Performed by: Sherwood Gambler, MD Authorized by: Sherwood Gambler, MD   Critical care provider statement:    Critical care time (minutes):  35   Critical care time was exclusive of:  Separately billable procedures and treating other patients   Critical care was necessary to treat or prevent imminent or life-threatening deterioration of the following conditions:  Respiratory failure   Critical care was time spent personally by me on the following activities:  Discussions with consultants, evaluation of patient's response to treatment, examination of patient, ordering and performing treatments and interventions, ordering and review of laboratory studies, ordering and review of radiographic studies, pulse oximetry, re-evaluation of patient's condition, obtaining history from patient or surrogate and review of old charts   (including critical care time)  Medications Ordered in ED Medications  sodium chloride (PF) 0.9 % injection (has no administration in time range)  albuterol (VENTOLIN HFA) 108 (90 Base) MCG/ACT inhaler 8 puff (has no administration in time range)  levofloxacin (LEVAQUIN) IVPB 750 mg (has no administration in time range)  albuterol (VENTOLIN HFA) 108 (90 Base) MCG/ACT inhaler 8 puff (8 puffs Inhalation Given 09/10/19 1302)  methylPREDNISolone sodium succinate (SOLU-MEDROL) 125 mg/2 mL injection 125 mg (125 mg Intravenous Given 09/10/19 1414)  magnesium sulfate IVPB 2 g 50 mL (0 g Intravenous Stopped 09/10/19 1639)  iohexol (OMNIPAQUE) 350 MG/ML injection 100 mL (100 mLs Intravenous Contrast Given 09/10/19 1440)    ED Course  I have reviewed the triage vital signs and the nursing notes.  Pertinent labs & imaging results  that were available during my care of the patient were reviewed by me and considered in my medical decision making (see chart for details).    MDM Rules/Calculators/A&P                      Patient is not in acute distress upon evaluation.  He is requiring oxygen though is able to wean this down to 2 L.  He has diffusely decreased breath sounds but no wheezing.  Given albuterol and with negative chest x-ray also given  Solu-Medrol and magnesium.  Overall I think this is a COPD exacerbation.  CT scan without obvious pneumonia or PE.  He does not appear edematous.  Had Covid a few months ago, repeat PCR testing pending.  He is a little sleepy on repeat evaluation and at one point I found him asleep with his oxygen off.  His O2 sats were 60%.  Was able to bring this back up and wean back down to about 3 L.  However he still converses easily with me and I think that CO2 narcosis is less likely but will get ABG.  He is clearly protecting airway.  Hospitalist will admit.  Bobby Caldwell was evaluated in Emergency Department on 09/10/2019 for the symptoms described in the history of present illness. He was evaluated in the context of the global COVID-19 pandemic, which necessitated consideration that the patient might be at risk for infection with the SARS-CoV-2 virus that causes COVID-19. Institutional protocols and algorithms that pertain to the evaluation of patients at risk for COVID-19 are in a state of rapid change based on information released by regulatory bodies including the CDC and federal and state organizations. These policies and algorithms were followed during the patient's care in the ED.  Final Clinical Impression(s) / ED Diagnoses Final diagnoses:  COPD exacerbation ([place])  Acute on chronic respiratory failure with hypoxia [facility])    Rx / DC Orders ED Discharge Orders    None       Sherwood Gambler, MD 09/10/19 1700

## 2019-09-10 NOTE — ED Notes (Signed)
CRITICAL VALUE STICKER  CRITICAL VALUE:PaCO2: 69.5  RECEIVER (on-site recipient of call):Shawnie Pons, RN  DATE & TIME NOTIFIED: 5:13 PM   MESSENGER (representative from lab):  MD NOTIFIED: RN Kaitlin notified  TIME OF NOTIFICATION:  RESPONSE: Will notify hospitalist

## 2019-09-10 NOTE — Progress Notes (Signed)
Rx Brief note: Lovenox  Rx adjusted Lovenox to 30 mg daily in pt with wt <45 kg  Thanks Dorrene German 09/10/2019 10:24 PM

## 2019-09-10 NOTE — ED Notes (Signed)
Attempted to call report, no answer. Will try back

## 2019-09-11 ENCOUNTER — Inpatient Hospital Stay (HOSPITAL_COMMUNITY): Payer: Medicare Other

## 2019-09-11 DIAGNOSIS — R9431 Abnormal electrocardiogram [ECG] [EKG]: Secondary | ICD-10-CM | POA: Diagnosis present

## 2019-09-11 DIAGNOSIS — Z9221 Personal history of antineoplastic chemotherapy: Secondary | ICD-10-CM | POA: Diagnosis not present

## 2019-09-11 DIAGNOSIS — I739 Peripheral vascular disease, unspecified: Secondary | ICD-10-CM | POA: Diagnosis present

## 2019-09-11 DIAGNOSIS — Z903 Acquired absence of stomach [part of]: Secondary | ICD-10-CM | POA: Diagnosis not present

## 2019-09-11 DIAGNOSIS — Z85028 Personal history of other malignant neoplasm of stomach: Secondary | ICD-10-CM | POA: Diagnosis not present

## 2019-09-11 DIAGNOSIS — J9622 Acute and chronic respiratory failure with hypercapnia: Secondary | ICD-10-CM | POA: Diagnosis present

## 2019-09-11 DIAGNOSIS — J441 Chronic obstructive pulmonary disease with (acute) exacerbation: Secondary | ICD-10-CM | POA: Diagnosis present

## 2019-09-11 DIAGNOSIS — Z8616 Personal history of COVID-19: Secondary | ICD-10-CM | POA: Diagnosis not present

## 2019-09-11 DIAGNOSIS — E43 Unspecified severe protein-calorie malnutrition: Secondary | ICD-10-CM | POA: Diagnosis present

## 2019-09-11 DIAGNOSIS — F17210 Nicotine dependence, cigarettes, uncomplicated: Secondary | ICD-10-CM | POA: Diagnosis present

## 2019-09-11 DIAGNOSIS — I361 Nonrheumatic tricuspid (valve) insufficiency: Secondary | ICD-10-CM

## 2019-09-11 DIAGNOSIS — R911 Solitary pulmonary nodule: Secondary | ICD-10-CM | POA: Diagnosis present

## 2019-09-11 DIAGNOSIS — J9601 Acute respiratory failure with hypoxia: Secondary | ICD-10-CM | POA: Diagnosis not present

## 2019-09-11 DIAGNOSIS — Z681 Body mass index (BMI) 19 or less, adult: Secondary | ICD-10-CM | POA: Diagnosis not present

## 2019-09-11 DIAGNOSIS — Z7951 Long term (current) use of inhaled steroids: Secondary | ICD-10-CM | POA: Diagnosis not present

## 2019-09-11 DIAGNOSIS — J9621 Acute and chronic respiratory failure with hypoxia: Secondary | ICD-10-CM

## 2019-09-11 DIAGNOSIS — Z20822 Contact with and (suspected) exposure to covid-19: Secondary | ICD-10-CM | POA: Diagnosis present

## 2019-09-11 DIAGNOSIS — Z86010 Personal history of colonic polyps: Secondary | ICD-10-CM | POA: Diagnosis not present

## 2019-09-11 DIAGNOSIS — E872 Acidosis: Secondary | ICD-10-CM | POA: Diagnosis present

## 2019-09-11 DIAGNOSIS — Z79899 Other long term (current) drug therapy: Secondary | ICD-10-CM | POA: Diagnosis not present

## 2019-09-11 DIAGNOSIS — K2970 Gastritis, unspecified, without bleeding: Secondary | ICD-10-CM | POA: Diagnosis present

## 2019-09-11 LAB — BLOOD GAS, ARTERIAL
Acid-Base Excess: 6.2 mmol/L — ABNORMAL HIGH (ref 0.0–2.0)
Bicarbonate: 32.3 mmol/L — ABNORMAL HIGH (ref 20.0–28.0)
Delivery systems: POSITIVE
Drawn by: 23281
Expiratory PAP: 7
FIO2: 32
Inspiratory PAP: 14
O2 Saturation: 98.4 %
Patient temperature: 97.9
pCO2 arterial: 53.8 mmHg — ABNORMAL HIGH (ref 32.0–48.0)
pH, Arterial: 7.394 (ref 7.350–7.450)
pO2, Arterial: 104 mmHg (ref 83.0–108.0)

## 2019-09-11 LAB — URINALYSIS, ROUTINE W REFLEX MICROSCOPIC
Bilirubin Urine: NEGATIVE
Glucose, UA: 150 mg/dL — AB
Hgb urine dipstick: NEGATIVE
Ketones, ur: NEGATIVE mg/dL
Leukocytes,Ua: NEGATIVE
Nitrite: NEGATIVE
Protein, ur: NEGATIVE mg/dL
Specific Gravity, Urine: 1.013 (ref 1.005–1.030)
pH: 6 (ref 5.0–8.0)

## 2019-09-11 LAB — BASIC METABOLIC PANEL
Anion gap: 9 (ref 5–15)
BUN: 15 mg/dL (ref 8–23)
CO2: 31 mmol/L (ref 22–32)
Calcium: 8.3 mg/dL — ABNORMAL LOW (ref 8.9–10.3)
Chloride: 94 mmol/L — ABNORMAL LOW (ref 98–111)
Creatinine, Ser: 0.46 mg/dL — ABNORMAL LOW (ref 0.61–1.24)
GFR calc Af Amer: >60 mL/min (ref >60)
GFR calc non Af Amer: >60 mL/min (ref >60)
Glucose, Bld: 130 mg/dL — ABNORMAL HIGH (ref 70–99)
Potassium: 4.9 mmol/L (ref 3.5–5.1)
Sodium: 134 mmol/L — ABNORMAL LOW (ref 135–145)

## 2019-09-11 LAB — ECHOCARDIOGRAM COMPLETE
Height: 65 in
Weight: 1580.26 oz

## 2019-09-11 LAB — HIV ANTIBODY (ROUTINE TESTING W REFLEX): HIV Screen 4th Generation wRfx: NONREACTIVE

## 2019-09-11 MED ORDER — LEVALBUTEROL HCL 0.63 MG/3ML IN NEBU: 0.6300 mg | INHALATION_SOLUTION | Freq: Four times a day (QID) | RESPIRATORY_TRACT | Status: DC | PRN

## 2019-09-11 MED ORDER — METOPROLOL TARTRATE 5 MG/5ML IV SOLN
2.5000 mg | Freq: Once | INTRAVENOUS | Status: AC
  Administered 2019-09-11: 2.5 mg via INTRAVENOUS
  Filled 2019-09-11: qty 5

## 2019-09-11 MED ORDER — SODIUM CHLORIDE 0.9% FLUSH
10.0000 mL | Freq: Two times a day (BID) | INTRAVENOUS | Status: DC
  Administered 2019-09-11 – 2019-09-12 (×3): 10 mL

## 2019-09-11 MED ORDER — ENSURE ENLIVE PO LIQD: 237.0000 mL | Freq: Two times a day (BID) | ORAL | Status: DC

## 2019-09-11 MED ORDER — ADULT MULTIVITAMIN W/MINERALS CH
1.0000 | ORAL_TABLET | Freq: Every day | ORAL | Status: DC
  Administered 2019-09-11 – 2019-09-12 (×2): 1 via ORAL
  Filled 2019-09-11 (×2): qty 1

## 2019-09-11 MED ORDER — PRO-STAT SUGAR FREE PO LIQD
30.0000 mL | Freq: Two times a day (BID) | ORAL | Status: DC
  Filled 2019-09-11: qty 30

## 2019-09-11 MED ORDER — GUAIFENESIN ER 600 MG PO TB12
1200.0000 mg | ORAL_TABLET | Freq: Two times a day (BID) | ORAL | Status: DC
  Administered 2019-09-11 – 2019-09-12 (×3): 1200 mg via ORAL
  Filled 2019-09-11 (×3): qty 2

## 2019-09-11 MED ORDER — SODIUM CHLORIDE 0.9% FLUSH
10.0000 mL | INTRAVENOUS | Status: DC | PRN
  Administered 2019-09-12: 10 mL

## 2019-09-11 MED ORDER — METOPROLOL TARTRATE 5 MG/5ML IV SOLN: 5.0000 mg | Freq: Once | INTRAVENOUS | Status: DC

## 2019-09-11 NOTE — Progress Notes (Signed)
PROGRESS NOTE    Rashed Hapner  [id number] DOB: 1956-01-17 DOA: 09/10/2019 PCP: Patient, No Pcp Per   Brief Narrative: Collen Jenerette is a 64 y.o. male with medical history significant of COPD, gastric cancer s/p partial gastrectomy and neoadjuvant chemotherapy, tobacco use, PAD, history of COVID-19 infection. Patient presented secondary to severe hypoxia and found to have a possibly COPD exacerbation with associated hypoxia and hypercapnia. BiPAP started on admission.   Assessment & Plan:   Active Problems:   Cigarette smoker   Acute respiratory failure with hypoxia and hypercapnia (HCC)   COPD with acute exacerbation (HCC)   Acute respiratory failure with hypoxia and hypercapnia Secondary to COPD exacerbation. ABG significant for a CO2 of 69.5 with an associated acidosis. Mental status is mildly compromised likely secondary to hypercapnia. BNP elevated but not much above previous and clinically does not seem consistent with heart failure. Repeat ABG improved with resolved acidemia. -Discontinue BiPAP -Aim for oxygen saturation of >88% and oxygen flow rate of 2 L/min -PT eval/ambulator pulse ox  Acute respiratory acidosis Secondary to below. Management above.  COPD exacerbation Cough with sputum production and hypoxia/dyspnea. Presentation not consistent with pneumonia although there is evidence of mucous plug on CT scan. Appears patient is also non-adherent with outpatient medication regimen. -Continue Levaquin, follow-up sputum culture -Flutter valve, Mucinex -Keep oxygen >88%; wean to 2 L via [place] -Duoneb q4 hours, solumedrol IV  History of gastric adenocarcinoma Patient follows with Dr. Alvy Bimler. He is s/p partial gastrectomy and neoadjuvant chemotherapy. Current plan is surveillance. He had a recent EGD which was significant for gastritis and evidence of mild candidal infection which was treated with Fluconazole.  Tobacco use Chronic. Patient has been counseled numerous  times about cessation. -Nicotine patch  History of COVID-19 infection Patient was not hospitalized for this infection. Patient is almost three months out of infection. Low risk for reinfection. Repeat COVID-19 testing is negative.  Abnormal EKG On telemetry, warning for ST elevation. EKG obtained by nurse. Reviewed. EKG is similar to yesterday and suggests LVH with early repolarization. Troponin from yesterday was negatively associated with ACS. Patient has no chest pain, nausea, vomiting. Discussed with cardiology. -Transthoracic Echocardiogram -Will keep on telemetry for now   DVT prophylaxis: Lovenox Code Status:   Code Status: Full Code Family Communication: Called daughter with no answer Disposition Plan: Will anticipate discharge tomorrow pending stability on oxygen via [place], PT eval and Transthoracic Echocardiogram results   Consultants:   None  Procedures:   BiPAP (3/3>>3/4)  Antimicrobials:  Levaquin (3/3>>    Subjective: Hungry. No other concerns  Objective: Vitals:   09/11/19 0300 09/11/19 0327 09/11/19 0358 09/11/19 0400  BP: 106/74 106/74  111/73  Pulse: (!) 102 92  (!) 102  Resp: 14 12  14   Temp:   97.9 F (36.6 C)   TempSrc:   Axillary   SpO2: 94% 95%  95%  Weight:      Height:        Intake/Output Summary (Last 24 hours) at 09/11/2019 0544 Last data filed at 09/11/2019 0400 Gross per 24 hour  Intake 703.12 ml  Output --  Net 703.12 ml   Filed Weights   09/10/19 2133  Weight: 44.8 kg    Examination:  General exam: Appears calm and comfortable. Underweight appearing. Respiratory system: Diminished. Respiratory effort normal. Cardiovascular system: S1 & S2 heard, RRR. No murmurs, rubs, gallops or clicks. Gastrointestinal system: Abdomen is nondistended, soft and nontender. No organomegaly or masses felt. Normal bowel sounds  heard. Central nervous system: Alert and oriented. No focal neurological deficits. Extremities: No edema. No calf  tenderness Skin: No cyanosis. No rashes Psychiatry: Judgement and insight appear normal. Mood & affect appropriate.     Data Reviewed: I have personally reviewed following labs and imaging studies  CBC: Recent Labs  Lab 09/10/19 1241  WBC 7.3  NEUTROABS 5.2  HGB 14.1  HCT 43.6  MCV 89.2  PLT 99991111   Basic Metabolic Panel: Recent Labs  Lab 09/10/19 1241  NA 131*  K 4.4  CL 94*  CO2 30  GLUCOSE 86  BUN 12  CREATININE 0.55*  CALCIUM 8.0*   GFR: Estimated Creatinine Clearance: 60.7 mL/min (A) (by C-G formula based on SCr of 0.55 mg/dL (L)). Liver Function Tests: Recent Labs  Lab 09/10/19 1241  AST 16  ALT 14  ALKPHOS 76  BILITOT 0.5  PROT 6.4*  ALBUMIN 3.3*   No results for input(s): LIPASE, AMYLASE in the last 168 hours. No results for input(s): AMMONIA in the last 168 hours. Coagulation Profile: Recent Labs  Lab 09/10/19 1314  INR 1.0   Cardiac Enzymes: No results for input(s): CKTOTAL, CKMB, CKMBINDEX, TROPONINI in the last 168 hours. BNP (last 3 results) No results for input(s): PROBNP in the last 8760 hours. HbA1C: No results for input(s): HGBA1C in the last 72 hours. CBG: No results for input(s): GLUCAP in the last 168 hours. Lipid Profile: No results for input(s): CHOL, HDL, LDLCALC, TRIG, CHOLHDL, LDLDIRECT in the last 72 hours. Thyroid Function Tests: No results for input(s): TSH, T4TOTAL, FREET4, T3FREE, THYROIDAB in the last 72 hours. Anemia Panel: No results for input(s): VITAMINB12, FOLATE, FERRITIN, TIBC, IRON, RETICCTPCT in the last 72 hours. Sepsis Labs: Recent Labs  Lab 09/10/19 1314  LATICACIDVEN 0.9    Recent Results (from the past 240 hour(s))  SARS CORONAVIRUS 2 (TAT 6-24 HRS) Nasopharyngeal Nasopharyngeal Swab     Status: None   Collection Time: 09/10/19  2:15 PM   Specimen: Nasopharyngeal Swab  Result Value Ref Range Status   SARS Coronavirus 2 NEGATIVE NEGATIVE Final    Comment: (NOTE) SARS-CoV-2 target nucleic acids  are NOT DETECTED. The SARS-CoV-2 RNA is generally detectable in upper and lower respiratory specimens during the acute phase of infection. Negative results do not preclude SARS-CoV-2 infection, do not rule out co-infections with other pathogens, and should not be used as the sole basis for treatment or other patient management decisions. Negative results must be combined with clinical observations, patient history, and epidemiological information. The expected result is Negative. Fact Sheet for Patients: SugarRoll.be Fact Sheet for Healthcare Providers: https://www.woods-mathews.com/ This test is not yet approved or cleared by the [place] FDA and  has been authorized for detection and/or diagnosis of SARS-CoV-2 by FDA under an Emergency Use Authorization (EUA). This EUA will remain  in effect (meaning this test can be used) for the duration of the COVID-19 declaration under Section 56 4(b)(1) of the Act, 21 U.S.C. section 360bbb-3(b)(1), unless the authorization is terminated or revoked sooner. Performed at [facility], [place] [address], [place], [place] [address]   MRSA PCR Screening     Status: None   Collection Time: 09/10/19  9:08 PM   Specimen: Nasal Mucosa; Nasopharyngeal  Result Value Ref Range Status   MRSA by PCR NEGATIVE NEGATIVE Final    Comment:        The GeneXpert MRSA Assay (FDA approved for NASAL specimens only), is one component of a comprehensive MRSA colonization  surveillance program. It is not intended to diagnose MRSA infection nor to guide or monitor treatment for MRSA infections. Performed at [facility], [place] [address], [place], [place] [address]          Radiology Studies: CT Angio Chest PE W and/or Wo Contrast  Result Date: 09/10/2019 CLINICAL DATA:  Short of breath respiratory distress. Concern for pulmonary embolism. EXAM: CT ANGIOGRAPHY CHEST WITH CONTRAST TECHNIQUE:  Multidetector CT imaging of the chest was performed using the standard protocol during bolus administration of intravenous contrast. Multiplanar CT image reconstructions and MIPs were obtained to evaluate the vascular anatomy. CONTRAST:  167mL OMNIPAQUE IOHEXOL 350 MG/ML SOLN COMPARISON:  None. FINDINGS: Cardiovascular: No filling defects within the pulmonary arteries arteries to suggest acute pulmonary embolism. Mediastinum/Nodes: No axillary supraclavicular adenopathy. No mediastinal hilar adenopathy. No pericardial fluid. Port in the anterior chest wall with tip in distal SVC. Lungs/Pleura: Centrilobular emphysema. No suspicious pulmonary nodularity. Stable small LEFT upper lobe nodule measuring 5 mm (image 28/7). Mild bronchial thickening and filling in the LEFT lower lobe bronchial tree (image 133/7, image 115/7) Upper Abdomen: Limited view of the liver, kidneys, pancreas are unremarkable. Normal adrenal glands. Musculoskeletal: No aggressive osseous lesion. Review of the MIP images confirms the above findings. IMPRESSION: 1. No acute pulmonary embolism. 2. New bronchial thickening in the LEFT lower lobe representing mucous plugging versus bronchitis. Favor mucous plugging. 3. Extensive centrilobular emphysema. 4. Stable small LEFT upper lobe pulmonary nodule. Electronically Signed   By: Suzy Bouchard M.D.   On: 09/10/2019 15:21   DG Chest Port 1 View  Result Date: 09/10/2019 CLINICAL DATA:  Shortness of breath and respiratory distress. History of lung cancer. EXAM: PORTABLE CHEST 1 VIEW COMPARISON:  Radiographs 12/02/2017 and 09/11/2017. CT 08/18/2019. FINDINGS: 1230 hours. Right IJ Port-A-Cath extends to the superior cavoatrial junction. The heart size and mediastinal contours are stable. The lungs are hyperinflated but remain clear. No enlarging pulmonary nodules, significant pleural effusion or pneumothorax. The bones appear unremarkable. IMPRESSION: Stable chest with evidence of chronic obstructive  pulmonary disease. No acute findings. Electronically Signed   By: Richardean Sale M.D.   On: 09/10/2019 13:19        Scheduled Meds: . chlorhexidine  15 mL Mouth Rinse BID  . Chlorhexidine Gluconate Cloth  6 each Topical Daily  . enoxaparin (LOVENOX) injection  30 mg Subcutaneous Daily  . ipratropium-albuterol  3 mL Inhalation Q4H  . mouth rinse  15 mL Mouth Rinse q12n4p  . methylPREDNISolone (SOLU-MEDROL) injection  40 mg Intravenous Daily  . mirtazapine  15 mg Oral QHS  . nicotine  7 mg Transdermal Daily  . sodium chloride flush  10-40 mL Intracatheter Q12H   Continuous Infusions: . sodium chloride 100 mL/hr at 09/10/19 2127  . levofloxacin (LEVAQUIN) IV       LOS: 0 days     Cordelia Poche, MD [facility] 09/11/2019, 5:44 AM  If 7PM-7AM, please contact night-coverage [email or web address]

## 2019-09-11 NOTE — Progress Notes (Signed)
Initial Nutrition Assessment  DOCUMENTATION CODES:   Severe malnutrition in context of chronic illness, Underweight  INTERVENTION:  - will order Ensure Enlive BID, each supplement provides 350 kcal and 20 grams of protein. - will order Magic Cup BID with meals, each supplement provides 290 kcal and 9 grams of protein. - will order 30 mL Prostat BID, each supplement provides 100 kcal and 15 grams of protein. - will order daily multivitamin with minerals.    NUTRITION DIAGNOSIS:   Severe Malnutrition related to chronic illness, cancer and cancer related treatments as evidenced by moderate fat depletion, moderate muscle depletion, severe fat depletion, severe muscle depletion.  GOAL:   Patient will meet greater than or equal to 90% of their needs  MONITOR:   PO intake, Supplement acceptance, Labs, Weight trends  REASON FOR ASSESSMENT:   Consult Assessment of nutrition requirement/status  ASSESSMENT:   64 y.o. male with medical history significant of COPD, gastric cancer s/p partial gastrectomy and neoadjuvant chemotherapy, tobacco use, PAD, history of COVID-19 infection. He presented to  the ED secondary to severe hypoxia and found to have possible COPD exacerbation with associated hypoxia and hypercapnia. BiPAP started on admission.  Patient sitting in chair with lunch tray in front of him. Able to talk with RN prior to and after visiting patient. She states that patient took pills and is able to eat and drink without issue/difficulty. She states diet was advanced this morning (advanced from NPO to Regular at 0710).   Patient's primary language is [place], but he is able to communicate with RD effectively in [place]. Now and PTA appetite fluctuates but is mainly good. For dinner last night he had a great appetite and today appetite is "so, so" but he is still feeling hungry and wanting to eat well at meals.   Patient previously had a J-tube, but it was removed sometime PTA.  Patient confirms J-tube is no longer in place and all nutrition is oral.   Unable to obtain further information at this time, but will attempt to gather more information at follow-up. Per Deveron Furlong, weight yesterday was 99 lb and weight on 03/04/19 was 108 lb. This indicates 9 lb weight loss (8.3% body weight) in the past 6 months; not significant for time frame, but unable to determine if weight loss occurred more acutely.   Per notes: - acute respiratory failure with hypoxia and hypercapnia - acute respiratory acidosis - hx of gastric adenocarcinoma and follows with Dr. Lawana Pai on surveillance - EGD showed gastritis and mild candidal infection treated with diflucan - COVID-19 infection 3 months PTA - possible d/c on 3/5 if he remains stable   Labs reviewed; Na: 134 mmol/l, Cl: 94 mmol/l, creatinine: 0.46 mg/dl, Ca: 8.3 mg/dl. Medications reviewed; 2 g IV Mg sulfate x1 run 3/3, 125 mg solu-medrol x1 dose 3/3, 40 mg solu-medrol/day. IVF; NS @ 100 ml/hr.     NUTRITION - FOCUSED PHYSICAL EXAM:    Most Recent Value  Orbital Region  Severe depletion  Upper Arm Region  Severe depletion  Thoracic and Lumbar Region  Severe depletion  Buccal Region  Severe depletion  Temple Region  Moderate depletion  Clavicle Bone Region  Severe depletion  Clavicle and Acromion Bone Region  Severe depletion  Scapular Bone Region  Moderate depletion  Dorsal Hand  Moderate depletion  Patellar Region  Moderate depletion  Anterior Thigh Region  Unable to assess  Posterior Calf Region  Severe depletion  Edema (RD Assessment)  None  Hair  Reviewed  Eyes  Reviewed  Mouth  Reviewed  Skin  Reviewed  Nails  Reviewed       Diet Order:   Diet Order            Diet regular Room service appropriate? Yes; Fluid consistency: Thin  Diet effective now              EDUCATION NEEDS:   No education needs have been identified at this time  Skin:  Skin Assessment: Reviewed RN  Assessment  Last BM:  PTA/unknown  Height:   Ht Readings from Last 1 Encounters:  09/10/19 5\' 5"  (1.651 m)    Weight:   Wt Readings from Last 1 Encounters:  09/10/19 44.8 kg    Ideal Body Weight:  56.8 kg  BMI:  Body mass index is 16.44 kg/m.  Estimated Nutritional Needs:   Kcal:  1705-2015 kcal  Protein:  85-100 grams  Fluid:  >/= 1.8 L/day     Jarome Matin, MS, RD, LDN, CNSC Inpatient Clinical Dietitian RD pager # available in AMION  After hours/weekend pager # available in [facility]

## 2019-09-11 NOTE — Progress Notes (Signed)
Norris - Patient denies chest pain, shortness of breath, dizziness.  Patient sitting in chair no signs of distress.  Metoprolol given per order.  Dr. Lonny Prude paged and notified of patient's VS after Metoprolol administration.   Dr. Lonny Prude returned page and notified of EKG results.  Echo for this afternoon.  Will be changing order for nebulizer.  Heart rate improved to 110-119 now.  Will continue to monitor.

## 2019-09-11 NOTE — Progress Notes (Signed)
Pt is awake, alert, no increased wob or respiratory distress noted or voiced by pt at this time.  [id number], [id number], spo2 94% on 2lnc.  Bipap remains in room on standby but not indicated at this time.

## 2019-09-11 NOTE — Evaluation (Signed)
Physical Therapy Evaluation Patient Details Name: Bobby Caldwell MRN: [id number] DOB: September 20, 1955 Today's Date: 09/11/2019   History of Present Illness  Pt admitted with acute respiratory failure 2* COPD exacerbation  Clinical Impression  Pt admitted with acute respiratory failure and presents with functional mobility limitations 2* generalized weakness and ambulatory balance deficits.  Pt very motivated and should progress to dc home with assist of family.    Follow Up Recommendations No PT follow up    Equipment Recommendations  None recommended by PT    Recommendations for Other Services       Precautions / Restrictions Precautions Precautions: Fall Precaution Comments: HOME O2 @ 2L - only during the day per pt Restrictions Weight Bearing Restrictions: No      Mobility  Bed Mobility Overal bed mobility: Needs Assistance Bed Mobility: Supine to Sit     Supine to sit: Supervision     General bed mobility comments: Increased time and assist for management of lines only  Transfers Overall transfer level: Needs assistance Equipment used: None Transfers: Sit to/from Stand Sit to Stand: Min assist         General transfer comment: min assist to bring wt up and fwd and to balance in initial standing  Ambulation/Gait Ambulation/Gait assistance: Min assist Gait Distance (Feet): 220 Feet Assistive device: 1 person hand held assist Gait Pattern/deviations: Step-through pattern;Decreased step length - right;Decreased step length - left;Shuffle;Trunk flexed;Wide base of support Gait velocity: decr   General Gait Details: mild general instability partially compensated with increased BOS  Stairs            Wheelchair Mobility    Modified Rankin (Stroke Patients Only)       Balance Overall balance assessment: Needs assistance Sitting-balance support: No upper extremity supported;Feet supported Sitting balance-Leahy Scale: Good     Standing balance support:  No upper extremity supported Standing balance-Leahy Scale: Fair                               Pertinent Vitals/Pain Pain Assessment: No/denies pain    Home Living Family/patient expects to be discharged to:: Private residence Living Arrangements: Spouse/significant other;Children Available Help at Discharge: Family Type of Home: House Home Access: Stairs to enter Entrance Stairs-Rails: Right Entrance Stairs-Number of Steps: 4 Home Layout: One level Home Equipment: None      Prior Function Level of Independence: Independent               Hand Dominance        Extremity/Trunk Assessment   Upper Extremity Assessment Upper Extremity Assessment: Generalized weakness    Lower Extremity Assessment Lower Extremity Assessment: Generalized weakness       Communication   Communication: Prefers language other than English(Pt follows basic conversation in [place])  Cognition Arousal/Alertness: Awake/alert Behavior During Therapy: WFL for tasks assessed/performed Overall Cognitive Status: Within Functional Limits for tasks assessed                                        General Comments      Exercises     Assessment/Plan    PT Assessment Patient needs continued PT services  PT Problem List Decreased strength;Decreased activity tolerance;Decreased balance;Decreased mobility;Decreased knowledge of use of DME       PT Treatment Interventions DME instruction;Gait training;Stair training;Functional mobility training;Therapeutic activities;Therapeutic exercise;Patient/family education;Balance training  PT Goals (Current goals can be found in the Care Plan section)  Acute Rehab PT Goals Patient Stated Goal: Walk PT Goal Formulation: With patient Time For Goal Achievement: 09/25/19 Potential to Achieve Goals: Good    Frequency Min 3X/week   Barriers to discharge        Co-evaluation               AM-PAC PT "6 Clicks"  Mobility  Outcome Measure Help needed turning from your back to your side while in a flat bed without using bedrails?: None Help needed moving from lying on your back to sitting on the side of a flat bed without using bedrails?: A Little Help needed moving to and from a bed to a chair (including a wheelchair)?: A Little Help needed standing up from a chair using your arms (e.g., wheelchair or bedside chair)?: A Little Help needed to walk in hospital room?: A Little Help needed climbing 3-5 steps with a railing? : A Little 6 Click Score: 19    End of Session Equipment Utilized During Treatment: Gait belt;Oxygen Activity Tolerance: Patient tolerated treatment well;Patient limited by fatigue Patient left: in chair;with call bell/phone within reach;with chair alarm set;with nursing/sitter in room Nurse Communication: Mobility status PT Visit Diagnosis: Muscle weakness (generalized) (M62.81);Difficulty in walking, not elsewhere classified (R26.2)    Time: [id number] PT Time Calculation (min) (ACUTE ONLY): 21 min   Charges:   PT Evaluation $PT Eval Low Complexity: 1 Low          [place] [facility] Pager [phone] Office [phone]   Ebonye Reade 09/11/2019, 12:08 PM

## 2019-09-11 NOTE — Progress Notes (Signed)
  Echocardiogram 2D Echocardiogram has been performed.  J[place]ette Dubin 09/11/2019, 1:23 PM

## 2019-09-11 NOTE — Progress Notes (Signed)
Central TELE notified RN of ST elevation on monitor, EKG completed, MD R. Nettey notified. EKG reviewed, Cards reviewed EKG at R. Nettey request, not concerned by recent EKG. Patient denies chest pain, no change in patient status. Alert and Oriented x4.   Previous EKG completed on 3/3 for similar event, no significant change from previous EKG, Troponin on 3/3 WNL.   ECHO ordered. Will continue to monitor.

## 2019-09-12 ENCOUNTER — Ambulatory Visit (HOSPITAL_COMMUNITY): Payer: Medicaid Other

## 2019-09-12 DIAGNOSIS — E43 Unspecified severe protein-calorie malnutrition: Secondary | ICD-10-CM

## 2019-09-12 LAB — URINE CULTURE: Culture: NO GROWTH

## 2019-09-12 MED ORDER — SPIRIVA RESPIMAT 2.5 MCG/ACT IN AERS: 2.0000 | INHALATION_SPRAY | Freq: Every day | RESPIRATORY_TRACT | 0 refills | Status: DC

## 2019-09-12 MED ORDER — ADULT MULTIVITAMIN W/MINERALS CH: 1.0000 | ORAL_TABLET | Freq: Every day | ORAL | 0 refills | Status: DC

## 2019-09-12 MED ORDER — PRO-STAT SUGAR FREE PO LIQD: 30.0000 mL | Freq: Two times a day (BID) | ORAL | 0 refills | Status: DC

## 2019-09-12 MED ORDER — BUDESONIDE-FORMOTEROL FUMARATE 160-4.5 MCG/ACT IN AERO: 2.0000 | INHALATION_SPRAY | Freq: Two times a day (BID) | RESPIRATORY_TRACT | 0 refills | Status: DC

## 2019-09-12 MED ORDER — HEPARIN SOD (PORK) LOCK FLUSH 100 UNIT/ML IV SOLN
500.0000 [IU] | INTRAVENOUS | Status: AC | PRN
  Administered 2019-09-12: 500 [IU]
  Filled 2019-09-12: qty 5

## 2019-09-12 MED ORDER — PREDNISONE 10 MG PO TABS: ORAL_TABLET | ORAL | 0 refills | Status: AC

## 2019-09-12 MED ORDER — ENSURE ENLIVE PO LIQD: 237.0000 mL | Freq: Two times a day (BID) | ORAL | 0 refills | Status: DC

## 2019-09-12 MED ORDER — LEVOFLOXACIN 750 MG PO TABS: 750.0000 mg | ORAL_TABLET | Freq: Every day | ORAL | Status: DC

## 2019-09-12 MED ORDER — GUAIFENESIN ER 600 MG PO TB12: 1200.0000 mg | ORAL_TABLET | Freq: Two times a day (BID) | ORAL | 0 refills | Status: AC

## 2019-09-12 NOTE — Discharge Instructions (Signed)
Bobby Caldwell,  You were in the hospital because of low oxygen related to a COPD exacerbation. You were treated with steroids and breathing treatments in addition to an antibiotic. You will go home with continued oxygen and need to follow-up with your pulmonologist. Please take your medication as prescribed and quit smoking as we discussed. You have declined assistive devices for ambulation. Please also use the flutter valve as prescribed every 2 hours to help with your cough/phlegm.

## 2019-09-12 NOTE — Progress Notes (Signed)
PHARMACIST - PHYSICIAN COMMUNICATION  CONCERNING: Antibiotic IV to Oral Route Change Policy  RECOMMENDATION: This patient is receiving levofloxaxin by the intravenous route.  Based on criteria approved by the Pharmacy and Therapeutics Committee, the antibiotic(s) is/are being converted to the equivalent oral dose form(s).   IV today, PO to start 3/6 AM   DESCRIPTION: These criteria include:  Patient being treated for a respiratory tract infection, urinary tract infection, cellulitis or clostridium difficile associated diarrhea if on metronidazole  The patient is not neutropenic and does not exhibit a GI malabsorption state  The patient is eating (either orally or via tube) and/or has been taking other orally administered medications for a least 24 hours  The patient is improving clinically and has a Tmax < 100.5  If you have questions about this conversion, please contact the Pharmacy Department  []   [phone] )  [facility] []   [phone] )  [facility] []   [phone] )  [facility] []   [phone] )  [facility] [x]   [phone] )  [facility]

## 2019-09-12 NOTE — Progress Notes (Signed)
Physical Therapy Treatment Patient Details Name: Bobby Caldwell MRN: [id number] DOB: April 14, 1956 Today's Date: 09/12/2019    History of Present Illness Pt admitted with acute respiratory failure 2* COPD exacerbation    PT Comments    Pt very motivated and with noted improvement in activity tolerance and ambulatory balance this session.  Pt ambulated 350' in halls; including walking backwards and sideways) with one mild episode balance loss when walking bkwds.  Pt reports he uses furniture and walls to steady as needed at home.  Pt eager for dc home with family asap and would benefit from prn use of RW.  Follow Up Recommendations  No PT follow up     Equipment Recommendations  Rolling walker with 5" wheels    Recommendations for Other Services       Precautions / Restrictions Precautions Precautions: Fall Precaution Comments: HOME O2 @ 2L - only during the day per pt Restrictions Weight Bearing Restrictions: No    Mobility  Bed Mobility Overal bed mobility: Modified Independent Bed Mobility: Supine to Sit;Sit to Supine     Supine to sit: Modified independent (Device/Increase time) Sit to supine: Modified independent (Device/Increase time)   General bed mobility comments: Pt unassisted to/from bed  Transfers Overall transfer level: Needs assistance Equipment used: None Transfers: Sit to/from Stand Sit to Stand: Supervision         General transfer comment: no physical assist  Ambulation/Gait Ambulation/Gait assistance: Min guard;Supervision Gait Distance (Feet): 350 Feet Assistive device: Rolling walker (2 wheeled);None Gait Pattern/deviations: Step-through pattern;Decreased step length - right;Decreased step length - left;Shuffle;Trunk flexed;Wide base of support Gait velocity: decr   General Gait Details: mild initial instability but no LOB; one episode mild balance loss when stepping backward only.  Ambulated 100' with RW and min cues - pt states he steadies on  furniture or door frames as needed at home   Stairs             Wheelchair Mobility    Modified Rankin (Stroke Patients Only)       Balance Overall balance assessment: Needs assistance Sitting-balance support: No upper extremity supported;Feet supported Sitting balance-Leahy Scale: Good     Standing balance support: No upper extremity supported Standing balance-Leahy Scale: Good                              Cognition Arousal/Alertness: Awake/alert Behavior During Therapy: WFL for tasks assessed/performed Overall Cognitive Status: Within Functional Limits for tasks assessed                                        Exercises      General Comments        Pertinent Vitals/Pain Pain Assessment: No/denies pain    Home Living                      Prior Function            PT Goals (current goals can now be found in the care plan section) Acute Rehab PT Goals Patient Stated Goal: Walk PT Goal Formulation: With patient Time For Goal Achievement: 09/25/19 Potential to Achieve Goals: Good Progress towards PT goals: Progressing toward goals    Frequency    Min 3X/week      PT Plan Current plan remains appropriate    Co-evaluation  AM-PAC PT "6 Clicks" Mobility   Outcome Measure  Help needed turning from your back to your side while in a flat bed without using bedrails?: None Help needed moving from lying on your back to sitting on the side of a flat bed without using bedrails?: None Help needed moving to and from a bed to a chair (including a wheelchair)?: A Little Help needed standing up from a chair using your arms (e.g., wheelchair or bedside chair)?: A Little Help needed to walk in hospital room?: A Little Help needed climbing 3-5 steps with a railing? : A Little 6 Click Score: 20    End of Session Equipment Utilized During Treatment: Gait belt;Oxygen Activity Tolerance: Patient tolerated  treatment well Patient left: in bed;with call bell/phone within reach;with bed alarm set Nurse Communication: Mobility status PT Visit Diagnosis: Muscle weakness (generalized) (M62.81);Difficulty in walking, not elsewhere classified (R26.2)     Time: [id number] PT Time Calculation (min) (ACUTE ONLY): 21 min  Charges:  $Gait Training: 8-22 mins                     [place] Pager [phone] Office [phone]    Bobby Caldwell 09/12/2019, 3:03 PM

## 2019-09-12 NOTE — Discharge Summary (Signed)
Physician Discharge Summary  Bobby Caldwell [id number] DOB: 1956/05/29 DOA: 09/10/2019  PCP: Patient, No Pcp Per  Admit date: 09/10/2019 Discharge date: 09/12/2019  Admitted From: Home Disposition: Home  Recommendations for Outpatient Follow-up:  1. Follow up with PCP in 1 week 2. Follow up with pulmonology 3. Please follow up on the following pending results: None  Home Health: None Equipment/Devices: Oxygen  Discharge Condition: Stable CODE STATUS: Full code Diet recommendation: Heart healthy   Brief/Interim Summary:  Chief Complaint: Feel dizzy and can't keep balance  HPI: Bobby Caldwell is a 64 y.o. male with medical history significant of COPD, gastric cancer s/p partial gastrectomy and neoadjuvant chemotherapy, tobacco use, PAD, history of COVID-19 infection. History is very difficult secondary to language barrier and patient being a poor historian. [place] interpreter used Clemetine Marker [phone]). Patient reports issues with feeling dizzy and not being able to keep his balance for the last 4 days. He did not take anything because he could not breath and was in a lot of pain. He does not know why he can't breathe and reports pain everywhere.  Per discussion with daughter, patient's oxygen was checked and was 64% on room air. Family called the patient's doctor who recommended EMS call. He is on 2 L via  at home.   Hospital course:  Acute respiratory failure with hypoxia and hypercapnia Secondary to COPD exacerbation. ABG significant for a CO2 of 69.5 with an associated acidosis. Mental status is mildly compromised likely secondary to hypercapnia. BNP elevated but not much above previous and clinically does not seem consistent with heart failure. Repeat ABG improved with resolved acidemia. CTA chest on admission is negative for acute/chronic pulmonary embolism.  Acute respiratory acidosis Secondary to below. Management above.  COPD exacerbation Cough with sputum production  and hypoxia/dyspnea. Presentation not consistent with pneumonia although there is evidence of mucous plug on CT scan. Appears patient is also non-adherent with outpatient medication regimen which was confirmed at bedside. He was managed on Levaquin, flutter valve and Mucinex. IV steroids also started. Patient was ambulated with pulse oximetry and will require home oxygen which is ordered. Completed Levaquin treatment prior to discharge. On discharge, will continue Mucinex, flutter valve. Will discharge with a prednisone taper and recommend to continue nebulizer treatments with albuterol.   History of gastric adenocarcinoma Patient follows with Dr. Alvy Bimler. He is s/p partial gastrectomy and neoadjuvant chemotherapy. Current plan is surveillance. He had a recent EGD which was significant for gastritis and evidence of mild candidal infection which was treated with Fluconazole.  Tobacco use Chronic. Patient has been counseled numerous times about cessation. Counseled this admission.  History of COVID-19 infection Patient was not hospitalized for this infection. Patient is almost three months out of infection. Low risk for reinfection. Repeat COVID-19 testing is negative.  Abnormal EKG On telemetry, warning for ST elevation. EKG obtained by nurse. Reviewed. EKG is similar to admiossionand suggests LVH with early repolarization. Troponin from admission was trended, stable and negative. No chest pain. Discussed with cardiology and in light of clinical presentation, appearance on EKG and negative Transthoracic Echocardiogram, likely early repolarization.   Sinus tachycardia Likely mediated by deconditioning. Patient is asymptomatic. As mentioned above, CTA chest negative for acute PE.  Severe protein-calorie malnutrition Ensure enlive BID, MVI  Pulmonary nodule Stable. In setting of smoker. Patient follows with pulmonology.  Discharge Diagnoses:  Active Problems:   Cigarette smoker   Acute  respiratory failure with hypoxia and hypercapnia (HCC)   COPD with acute  exacerbation ([place])   Acute respiratory failure with hypoxia (HCC)   Protein-calorie malnutrition, severe    Discharge Instructions   Allergies as of 09/12/2019   No Known Allergies     Medication List    STOP taking these medications   potassium chloride SA 20 MEQ tablet Commonly known as: KLOR-CON     TAKE these medications   budesonide-formoterol 160-4.5 MCG/ACT inhaler Commonly known as: Symbicort Inhale 2 puffs into the lungs 2 (two) times daily.   feeding supplement (ENSURE ENLIVE) Liqd Take 237 mLs by mouth 2 (two) times daily between meals. Start taking on: September 13, 2019   feeding supplement (PRO-STAT SUGAR FREE 64) Liqd Take 30 mLs by mouth 2 (two) times daily.   guaiFENesin 600 MG 12 hr tablet Commonly known as: MUCINEX Take 2 tablets (1,200 mg total) by mouth 2 (two) times daily for 5 days.   ipratropium-albuterol 0.5-2.5 (3) MG/3ML Soln Commonly known as: DUONEB Inhale 3 mLs into the lungs 4 (four) times daily. Dx:J44.9 What changed: when to take this   mirtazapine 15 MG tablet Commonly known as: Remeron Take 1 tablet (15 mg total) by mouth at bedtime.   multivitamin with minerals Tabs tablet Take 1 tablet by mouth daily. Start taking on: September 13, 2019   predniSONE 10 MG tablet Commonly known as: DELTASONE Take 2 tablets (20 mg total) by mouth daily with breakfast for 2 days, THEN 1 tablet (10 mg total) daily with breakfast for 2 days. Start taking on: September 13, 2019   Spiriva Respimat 2.5 MCG/ACT Aers Generic drug: Tiotropium Bromide Monohydrate Inhale 2 puffs into the lungs daily. What changed: Another medication with the same name was removed. Continue taking this medication, and follow the directions you see here.       No Known Allergies  Consultations:  None   Procedures/Studies: CT Chest W Contrast  Result Date: 08/18/2019 CLINICAL DATA:  Restaging gastric  cancer. EXAM: CT CHEST, ABDOMEN, AND PELVIS WITH CONTRAST TECHNIQUE: Multidetector CT imaging of the chest, abdomen and pelvis was performed following the standard protocol during bolus administration of intravenous contrast. CONTRAST:  25mL OMNIPAQUE IOHEXOL 300 MG/ML  SOLN COMPARISON:  08/14/2018 FINDINGS: CT CHEST FINDINGS Cardiovascular: The heart is normal in size. No pericardial effusion. Stable mild tortuosity, ectasia and calcification of the thoracic aorta. No dissection. The branch vessels are patent. No definite coronary artery calcifications. The right-sided Port-A-Cath is stable. Mediastinum/Nodes: Small scattered mediastinal and hilar lymph nodes but no mass or adenopathy. The esophagus is grossly normal. Lungs/Pleura: Stable emphysematous changes and pulmonary scarring. Stable 4.5 mm left apical nodule on image 23/6. Stable 4 mm right middle lobe pulmonary nodule on image 119/6. Stable 4.5 mm subpleural left lower lobe pulmonary nodule on image number 123/6. Stable calcified granulomas. No new pulmonary nodules or acute overlying pulmonary process. Small amount of debris is noted in the trachea. Musculoskeletal: No chest wall mass, supraclavicular or axillary adenopathy. The thyroid gland appears normal. No significant bony findings. CT ABDOMEN PELVIS FINDINGS Hepatobiliary: Stable 4 mm low-attenuation lesion left hepatic lobe peripherally, likely benign cyst. No worrisome hepatic lesions or evidence of peritoneal surface disease. The gallbladder appears normal. No common bile duct dilatation. Pancreas: No mass, inflammation or ductal dilatation. Spleen: Normal size. No focal lesions. Adrenals/Urinary Tract: The adrenal glands and kidneys are unremarkable. The bladder is normal. Stomach/Bowel: Surgical changes from gastric or section and gastrojejunostomy. No complicating features. The small bowel and colon are unremarkable. The terminal ileum and appendix are normal.  Vascular/Lymphatic: Stable left  common iliac artery stent. The right iliac artery is occluded. There is a fem-fem bypass graft noted. The aortic branch vessels are patent. No mesenteric or retroperitoneal mass or adenopathy. Reproductive: The prostate gland and seminal vesicles are unremarkable. Other: No pelvic mass or pelvic adenopathy. No free pelvic fluid collections. No inguinal mass or adenopathy. Musculoskeletal: No significant bony findings. No evidence of osseous metastatic disease. IMPRESSION: 1. Stable surgical changes from a gastric resection and gastrojejunostomy. No findings for recurrent tumor. 2. No abdominal/pelvic metastatic disease. 3. Stable small pulmonary nodules.  No new or progressive findings. 4. Stable emphysematous changes and pulmonary scarring. Aortic Atherosclerosis (ICD10-I70.0) and Emphysema (ICD10-J43.9). Electronically Signed   By: Marijo Sanes M.D.   On: 08/18/2019 09:08   CT Angio Chest PE W and/or Wo Contrast  Result Date: 09/10/2019 CLINICAL DATA:  Short of breath respiratory distress. Concern for pulmonary embolism. EXAM: CT ANGIOGRAPHY CHEST WITH CONTRAST TECHNIQUE: Multidetector CT imaging of the chest was performed using the standard protocol during bolus administration of intravenous contrast. Multiplanar CT image reconstructions and MIPs were obtained to evaluate the vascular anatomy. CONTRAST:  144mL OMNIPAQUE IOHEXOL 350 MG/ML SOLN COMPARISON:  None. FINDINGS: Cardiovascular: No filling defects within the pulmonary arteries arteries to suggest acute pulmonary embolism. Mediastinum/Nodes: No axillary supraclavicular adenopathy. No mediastinal hilar adenopathy. No pericardial fluid. Port in the anterior chest wall with tip in distal SVC. Lungs/Pleura: Centrilobular emphysema. No suspicious pulmonary nodularity. Stable small LEFT upper lobe nodule measuring 5 mm (image 28/7). Mild bronchial thickening and filling in the LEFT lower lobe bronchial tree (image 133/7, image 115/7) Upper Abdomen: Limited  view of the liver, kidneys, pancreas are unremarkable. Normal adrenal glands. Musculoskeletal: No aggressive osseous lesion. Review of the MIP images confirms the above findings. IMPRESSION: 1. No acute pulmonary embolism. 2. New bronchial thickening in the LEFT lower lobe representing mucous plugging versus bronchitis. Favor mucous plugging. 3. Extensive centrilobular emphysema. 4. Stable small LEFT upper lobe pulmonary nodule. Electronically Signed   By: Suzy Bouchard M.D.   On: 09/10/2019 15:21   CT Abdomen Pelvis W Contrast  Result Date: 08/18/2019 CLINICAL DATA:  Restaging gastric cancer. EXAM: CT CHEST, ABDOMEN, AND PELVIS WITH CONTRAST TECHNIQUE: Multidetector CT imaging of the chest, abdomen and pelvis was performed following the standard protocol during bolus administration of intravenous contrast. CONTRAST:  90mL OMNIPAQUE IOHEXOL 300 MG/ML  SOLN COMPARISON:  08/14/2018 FINDINGS: CT CHEST FINDINGS Cardiovascular: The heart is normal in size. No pericardial effusion. Stable mild tortuosity, ectasia and calcification of the thoracic aorta. No dissection. The branch vessels are patent. No definite coronary artery calcifications. The right-sided Port-A-Cath is stable. Mediastinum/Nodes: Small scattered mediastinal and hilar lymph nodes but no mass or adenopathy. The esophagus is grossly normal. Lungs/Pleura: Stable emphysematous changes and pulmonary scarring. Stable 4.5 mm left apical nodule on image 23/6. Stable 4 mm right middle lobe pulmonary nodule on image 119/6. Stable 4.5 mm subpleural left lower lobe pulmonary nodule on image number 123/6. Stable calcified granulomas. No new pulmonary nodules or acute overlying pulmonary process. Small amount of debris is noted in the trachea. Musculoskeletal: No chest wall mass, supraclavicular or axillary adenopathy. The thyroid gland appears normal. No significant bony findings. CT ABDOMEN PELVIS FINDINGS Hepatobiliary: Stable 4 mm low-attenuation lesion left  hepatic lobe peripherally, likely benign cyst. No worrisome hepatic lesions or evidence of peritoneal surface disease. The gallbladder appears normal. No common bile duct dilatation. Pancreas: No mass, inflammation or ductal dilatation. Spleen: Normal size. No  focal lesions. Adrenals/Urinary Tract: The adrenal glands and kidneys are unremarkable. The bladder is normal. Stomach/Bowel: Surgical changes from gastric or section and gastrojejunostomy. No complicating features. The small bowel and colon are unremarkable. The terminal ileum and appendix are normal. Vascular/Lymphatic: Stable left common iliac artery stent. The right iliac artery is occluded. There is a fem-fem bypass graft noted. The aortic branch vessels are patent. No mesenteric or retroperitoneal mass or adenopathy. Reproductive: The prostate gland and seminal vesicles are unremarkable. Other: No pelvic mass or pelvic adenopathy. No free pelvic fluid collections. No inguinal mass or adenopathy. Musculoskeletal: No significant bony findings. No evidence of osseous metastatic disease. IMPRESSION: 1. Stable surgical changes from a gastric resection and gastrojejunostomy. No findings for recurrent tumor. 2. No abdominal/pelvic metastatic disease. 3. Stable small pulmonary nodules.  No new or progressive findings. 4. Stable emphysematous changes and pulmonary scarring. Aortic Atherosclerosis (ICD10-I70.0) and Emphysema (ICD10-J43.9). Electronically Signed   By: Marijo Sanes M.D.   On: 08/18/2019 09:08   DG Chest Port 1 View  Result Date: 09/10/2019 CLINICAL DATA:  Shortness of breath and respiratory distress. History of lung cancer. EXAM: PORTABLE CHEST 1 VIEW COMPARISON:  Radiographs 12/02/2017 and 09/11/2017. CT 08/18/2019. FINDINGS: 1230 hours. Right IJ Port-A-Cath extends to the superior cavoatrial junction. The heart size and mediastinal contours are stable. The lungs are hyperinflated but remain clear. No enlarging pulmonary nodules, significant  pleural effusion or pneumothorax. The bones appear unremarkable. IMPRESSION: Stable chest with evidence of chronic obstructive pulmonary disease. No acute findings. Electronically Signed   By: Richardean Sale M.D.   On: 09/10/2019 13:19   ECHOCARDIOGRAM COMPLETE  Result Date: 09/11/2019    ECHOCARDIOGRAM REPORT   Patient Name:   Bobby Caldwell Date of Exam: 09/11/2019 Medical Rec #:  [id number]   Height:       65.0 in Accession #:    [id number]  Weight:       98.8 lb Date of Birth:  24-Mar-1956   BSA:          1.466 m Patient Age:    67 years    BP:           136/86 mmHg Patient Gender: M           HR:           119 bpm. Exam Location:  Inpatient Procedure: 2D Echo Indications:    Abnormal ECG R94.31  History:        Patient has no prior history of Echocardiogram examinations,                 most recent 06/26/2017. COPD, Arrythmias:PAC; Risk                 Factors:Current Smoker.  Sonographer:    Mikki Santee RDCS (AE) Referring Phys: [phone] Leliana Kontz A Muse  1. Left ventricular ejection fraction, by estimation, is 60 to 65%. The left ventricle has normal function. The left ventricle has no regional wall motion abnormalities. Indeterminate diastolic filling due to E-A fusion.  2. Right ventricular systolic function is normal. The right ventricular size is normal. There is moderately elevated pulmonary artery systolic pressure. The estimated right ventricular systolic pressure is 99991111 mmHg.  3. The mitral valve is normal in structure and function. No evidence of mitral valve regurgitation. No evidence of mitral stenosis.  4. The aortic valve is normal in structure and function. Aortic valve regurgitation is not visualized. No aortic stenosis is present.  5. The inferior  vena cava is normal in size with greater than 50% respiratory variability, suggesting right atrial pressure of 3 mmHg. FINDINGS  Left Ventricle: Left ventricular ejection fraction, by estimation, is 60 to 65%. The left ventricle has normal  function. The left ventricle has no regional wall motion abnormalities. The left ventricular internal cavity size was normal in size. There is  no left ventricular hypertrophy. Indeterminate diastolic filling due to E-A fusion. Right Ventricle: The right ventricular size is normal. No increase in right ventricular wall thickness. Right ventricular systolic function is normal. There is moderately elevated pulmonary artery systolic pressure. The tricuspid regurgitant velocity is 3.17 m/s, and with an assumed right atrial pressure of 8 mmHg, the estimated right ventricular systolic pressure is 99991111 mmHg. Left Atrium: Left atrial size was normal in size. Right Atrium: Right atrial size was normal in size. Pericardium: There is no evidence of pericardial effusion. Mitral Valve: The mitral valve is normal in structure and function. Normal mobility of the mitral valve leaflets. No evidence of mitral valve regurgitation. No evidence of mitral valve stenosis. Tricuspid Valve: The tricuspid valve is normal in structure. Tricuspid valve regurgitation is mild . No evidence of tricuspid stenosis. Aortic Valve: The aortic valve is normal in structure and function. Aortic valve regurgitation is not visualized. No aortic stenosis is present. Pulmonic Valve: The pulmonic valve was normal in structure. Pulmonic valve regurgitation is not visualized. No evidence of pulmonic stenosis. Aorta: The aortic root is normal in size and structure. Venous: The inferior vena cava is normal in size with greater than 50% respiratory variability, suggesting right atrial pressure of 3 mmHg. IAS/Shunts: No atrial level shunt detected by color flow Doppler.  LEFT VENTRICLE PLAX 2D LVIDd:         3.60 cm  Diastology LVIDs:         2.40 cm  LV e' lateral: 9.79 cm/s LV PW:         0.90 cm  LV e' medial:  6.96 cm/s LV IVS:        1.00 cm LVOT diam:     1.80 cm LV SV:         36 LV SV Index:   25 LVOT Area:     2.54 cm  RIGHT VENTRICLE RV S prime:      10.70 cm/s TAPSE (M-mode): 1.4 cm LEFT ATRIUM             Index       RIGHT ATRIUM          Index LA diam:        1.60 cm 1.09 cm/m  RA Area:     6.89 cm LA Vol (A2C):   15.1 ml 10.30 ml/m RA Volume:   11.60 ml 7.91 ml/m LA Vol (A4C):   12.3 ml 8.39 ml/m LA Biplane Vol: 13.7 ml 9.35 ml/m  AORTIC VALVE LVOT Vmax:   92.30 cm/s LVOT Vmean:  59.900 cm/s LVOT VTI:    0.143 m  AORTA Ao Root diam: 2.70 cm TRICUSPID VALVE TR Peak grad:   40.2 mmHg TR Vmax:        317.00 cm/s  SHUNTS Systemic VTI:  0.14 m Systemic Diam: 1.80 cm Dani Gobble Croitoru MD Electronically signed by Sanda Klein MD Signature Date/Time: 09/11/2019/3:02:47 PM    Final      TRANSTHORACIC ECHOCARDIOGRAM (09/11/2019) IMPRESSIONS    1. Left ventricular ejection fraction, by estimation, is 60 to 65%. The  left ventricle has normal function. The left ventricle  has no regional  wall motion abnormalities. Indeterminate diastolic filling due to E-A  fusion.  2. Right ventricular systolic function is normal. The right ventricular  size is normal. There is moderately elevated pulmonary artery systolic  pressure. The estimated right ventricular systolic pressure is 99991111 mmHg.  3. The mitral valve is normal in structure and function. No evidence of  mitral valve regurgitation. No evidence of mitral stenosis.  4. The aortic valve is normal in structure and function. Aortic valve  regurgitation is not visualized. No aortic stenosis is present.  5. The inferior vena cava is normal in size with greater than 50%  respiratory variability, suggesting right atrial pressure of 3 mmHg.   Subjective: Some dyspnea and cough. No sputum.   Discharge Exam: Vitals:   09/12/19 0600 09/12/19 0800  BP: 136/76   Pulse: 99   Resp: 19   Temp:  99 F (37.2 C)  SpO2: 97%    Vitals:   09/12/19 0400 09/12/19 0500 09/12/19 0600 09/12/19 0800  BP: (!) 154/96 124/80 136/76   Pulse: (!) 107 (!) 103 99   Resp: 19 18 19    Temp:    99 F (37.2 C)    TempSrc:    Oral  SpO2: 97% 96% 97%   Weight:      Height:        General: Pt is alert, awake, not in acute distress. Cachectic Cardiovascular: RRR, S1/S2 +, no rubs, no gallops Respiratory: Diminished with no wheezing. No tachypnea. No respiratory distress. Patient speaks in full sentences without issue Abdominal: Soft, NT, ND, bowel sounds + Extremities: no edema, no cyanosis    The results of significant diagnostics from this hospitalization (including imaging, microbiology, ancillary and laboratory) are listed below for reference.     Microbiology: Recent Results (from the past 240 hour(s))  Blood Culture (routine x 2)     Status: None (Preliminary result)   Collection Time: 09/10/19  1:15 PM   Specimen: BLOOD  Result Value Ref Range Status   Specimen Description   Final    BLOOD RIGHT ANTECUBITAL Performed at [facility] [address], [place], [place] [address]    Special Requests   Final    BOTTLES DRAWN AEROBIC AND ANAEROBIC Blood Culture adequate volume Performed at [facility] [address] [place], [place] [address]    Culture   Final    NO GROWTH < 24 HOURS Performed at [facility] [address], [place], [place] [address]    Report Status PENDING  Incomplete  SARS CORONAVIRUS 2 (TAT 6-24 HRS) Nasopharyngeal Nasopharyngeal Swab     Status: None   Collection Time: 09/10/19  2:15 PM   Specimen: Nasopharyngeal Swab  Result Value Ref Range Status   SARS Coronavirus 2 NEGATIVE NEGATIVE Final    Comment: (NOTE) SARS-CoV-2 target nucleic acids are NOT DETECTED. The SARS-CoV-2 RNA is generally detectable in upper and lower respiratory specimens during the acute phase of infection. Negative results do not preclude SARS-CoV-2 infection, do not rule out co-infections with other pathogens, and should not be used as the sole basis for treatment or other patient management decisions. Negative results must be  combined with clinical observations, patient history, and epidemiological information. The expected result is Negative. Fact Sheet for Patients: SugarRoll.be Fact Sheet for Healthcare Providers: https://www.woods-mathews.com/ This test is not yet approved or cleared by the [place] FDA and  has been authorized for detection and/or diagnosis of SARS-CoV-2 by  FDA under an Emergency Use Authorization (EUA). This EUA will remain  in effect (meaning this test can be used) for the duration of the COVID-19 declaration under Section 56 4(b)(1) of the Act, 21 U.S.C. section 360bbb-3(b)(1), unless the authorization is terminated or revoked sooner. Performed at [facility], [place] [address] [place], [place] [address]   MRSA PCR Screening     Status: None   Collection Time: 09/10/19  9:08 PM   Specimen: Nasal Mucosa; Nasopharyngeal  Result Value Ref Range Status   MRSA by PCR NEGATIVE NEGATIVE Final    Comment:        The GeneXpert MRSA Assay (FDA approved for NASAL specimens only), is one component of a comprehensive MRSA colonization surveillance program. It is not intended to diagnose MRSA infection nor to guide or monitor treatment for MRSA infections. Performed at [facility], [place] [address] [place], [place] [address]      Labs: BNP (last 3 results) Recent Labs    09/10/19 1241  BNP 123456*   Basic Metabolic Panel: Recent Labs  Lab 09/10/19 1241 09/11/19 0554  NA 131* 134*  K 4.4 4.9  CL 94* 94*  CO2 30 31  GLUCOSE 86 130*  BUN 12 15  CREATININE 0.55* 0.46*  CALCIUM 8.0* 8.3*   Liver Function Tests: Recent Labs  Lab 09/10/19 1241  AST 16  ALT 14  ALKPHOS 76  BILITOT 0.5  PROT 6.4*  ALBUMIN 3.3*   No results for input(s): LIPASE, AMYLASE in the last 168 hours. No results for input(s): AMMONIA in the last 168 hours. CBC: Recent Labs  Lab 09/10/19 1241  WBC 7.3  NEUTROABS 5.2  HGB  14.1  HCT 43.6  MCV 89.2  PLT 245   Cardiac Enzymes: No results for input(s): CKTOTAL, CKMB, CKMBINDEX, TROPONINI in the last 168 hours. BNP: Invalid input(s): POCBNP CBG: No results for input(s): GLUCAP in the last 168 hours. D-Dimer No results for input(s): DDIMER in the last 72 hours. Hgb A1c No results for input(s): HGBA1C in the last 72 hours. Lipid Profile No results for input(s): CHOL, HDL, LDLCALC, TRIG, CHOLHDL, LDLDIRECT in the last 72 hours. Thyroid function studies No results for input(s): TSH, T4TOTAL, T3FREE, THYROIDAB in the last 72 hours.  Invalid input(s): FREET3 Anemia work up No results for input(s): VITAMINB12, FOLATE, FERRITIN, TIBC, IRON, RETICCTPCT in the last 72 hours. Urinalysis    Component Value Date/Time   COLORURINE YELLOW 09/10/2019 1415   APPEARANCEUR CLEAR 09/10/2019 1415   LABSPEC 1.013 09/10/2019 1415   PHURINE 6.0 09/10/2019 1415   GLUCOSEU 150 (A) 09/10/2019 1415   HGBUR NEGATIVE 09/10/2019 1415   BILIRUBINUR NEGATIVE 09/10/2019 1415   KETONESUR NEGATIVE 09/10/2019 1415   PROTEINUR NEGATIVE 09/10/2019 1415   NITRITE NEGATIVE 09/10/2019 1415   LEUKOCYTESUR NEGATIVE 09/10/2019 1415   Sepsis Labs Invalid input(s): PROCALCITONIN,  WBC,  LACTICIDVEN Microbiology Recent Results (from the past 240 hour(s))  Blood Culture (routine x 2)     Status: None (Preliminary result)   Collection Time: 09/10/19  1:15 PM   Specimen: BLOOD  Result Value Ref Range Status   Specimen Description   Final    BLOOD RIGHT ANTECUBITAL Performed at [facility], [place] [address], [place], [place] [address]    Special Requests   Final    BOTTLES DRAWN AEROBIC AND ANAEROBIC Blood Culture adequate volume Performed at [place] [address], [place], [place] [address]    Culture  Final    NO GROWTH < 24 HOURS Performed at [facility], [place] [address] [place], [place] [address]    Report Status PENDING   Incomplete  SARS CORONAVIRUS 2 (TAT 6-24 HRS) Nasopharyngeal Nasopharyngeal Swab     Status: None   Collection Time: 09/10/19  2:15 PM   Specimen: Nasopharyngeal Swab  Result Value Ref Range Status   SARS Coronavirus 2 NEGATIVE NEGATIVE Final    Comment: (NOTE) SARS-CoV-2 target nucleic acids are NOT DETECTED. The SARS-CoV-2 RNA is generally detectable in upper and lower respiratory specimens during the acute phase of infection. Negative results do not preclude SARS-CoV-2 infection, do not rule out co-infections with other pathogens, and should not be used as the sole basis for treatment or other patient management decisions. Negative results must be combined with clinical observations, patient history, and epidemiological information. The expected result is Negative. Fact Sheet for Patients: SugarRoll.be Fact Sheet for Healthcare Providers: https://www.woods-mathews.com/ This test is not yet approved or cleared by the [place] FDA and  has been authorized for detection and/or diagnosis of SARS-CoV-2 by FDA under an Emergency Use Authorization (EUA). This EUA will remain  in effect (meaning this test can be used) for the duration of the COVID-19 declaration under Section 56 4(b)(1) of the Act, 21 U.S.C. section 360bbb-3(b)(1), unless the authorization is terminated or revoked sooner. Performed at [facility], [place] [address], [place], [place] [address]   MRSA PCR Screening     Status: None   Collection Time: 09/10/19  9:08 PM   Specimen: Nasal Mucosa; Nasopharyngeal  Result Value Ref Range Status   MRSA by PCR NEGATIVE NEGATIVE Final    Comment:        The GeneXpert MRSA Assay (FDA approved for NASAL specimens only), is one component of a comprehensive MRSA colonization surveillance program. It is not intended to diagnose MRSA infection nor to guide or monitor treatment for MRSA infections. Performed at [facility], [name] [address] [place], [place] [address]      Time coordinating discharge: 35 minutes  SIGNED:   Cordelia Poche, MD [facility] 09/12/2019, 10:49 AM

## 2019-09-12 NOTE — Progress Notes (Signed)
SATURATION QUALIFICATIONS: (This note is used to comply with regulatory documentation for home oxygen)  Patient Saturations on Room Air at Rest = 87%  Patient Saturations on Room Air while Ambulating = 84%  Patient Saturations on 2 Liters of oxygen while Ambulating = 97%  Please briefly explain why patient needs home oxygen: History of COPD

## 2019-09-12 NOTE — TOC Progression Note (Signed)
Transition of Care [facility]) - Progression Note    Patient Details  Name: Bobby Caldwell MRN: [id number] Date of Birth: September 25, 1955  Transition of Care [facility]) CM/SW [place], [place] Phone Number: 09/12/2019, 4:09 PM  Clinical Narrative:    Gilford Rile ordered for home use.    Expected Discharge Plan: Home/Self Care Barriers to Discharge: Continued Medical Work up  Expected Discharge Plan and Services Expected Discharge Plan: Home/Self Care In-house Referral: Interpreting Services Discharge Planning Services: CM Consult   Living arrangements for the past 2 months: Single Family Home Expected Discharge Date: 09/12/19               DME Arranged: Gilford Rile rolling DME Agency: [employer] Date DME Agency Contacted: 09/12/19 Time DME Agency Contacted: [phone] Representative spoke with at DME Agency: Thedore Mins             Social Determinants of Health ([place]) Interventions    Readmission Risk Interventions No flowsheet data found.

## 2019-09-12 NOTE — TOC Initial Note (Signed)
Transition of Care [facility]) - Initial/Assessment Note    Patient Details  Name: Bobby Caldwell MRN: [id number] Date of Birth: [birth year]  Transition [facility]) CM/SW Contact:    Lia Hopping, [place] Phone Number: 09/12/2019, 12:13 PM  Clinical Narrative:                 CSW reached out to the patient daughters and spoke with Samire. She reports the patient is currently on 2L of [place] at home. She reports the DME is from [employer]. She reports the company bills the patient medicare, they do not pay out of pocket cost for tank. Patient currently on 2L and will d/c on 2L. CSW reached out to the company [employer] to confirm.  Patient family will bring transport tank at discharge.    Expected Discharge Plan: Home/Self Care Barriers to Discharge: Continued Medical Work up   Patient Goals and CMS Choice     Choice offered to / list presented to : NA  Expected Discharge Plan and Services Expected Discharge Plan: Home/Self Care In-house Referral: Interpreting Services Discharge Planning Services: CM Consult   Living arrangements for the past 2 months: Single Family Home                                      Prior Living Arrangements/Services Living arrangements for the past 2 months: Single Family Home Lives with:: Adult Children, Spouse Patient language and need for interpreter reviewed:: Yes Do you feel safe going back to the place where you live?: Yes      Need for Family Participation in Patient Care: Yes (Comment) Care giver support system in place?: Yes (comment) Current home services: DME Criminal Activity/Legal Involvement Pertinent to Current Situation/Hospitalization: No - Comment as needed  Activities of Daily Living Home Assistive Devices/Equipment: Eyeglasses, Nebulizer ADL Screening (condition at time of admission) Patient's cognitive ability adequate to safely complete daily activities?: Yes Is the patient deaf or have difficulty hearing?: No Does the patient  have difficulty seeing, even when wearing glasses/contacts?: No Does the patient have difficulty concentrating, remembering, or making decisions?: No Patient able to express need for assistance with ADLs?: Yes Does the patient have difficulty dressing or bathing?: No Independently performs ADLs?: Yes (appropriate for developmental age) Does the patient have difficulty walking or climbing stairs?: Yes(secondary to shortness of breath) Weakness of Legs: Both Weakness of Arms/Hands: None  Permission Sought/Granted Permission sought to share information with : Customer service manager                Emotional Assessment         Alcohol / Substance Use: Not Applicable Psych Involvement: No (comment)  Admission diagnosis:  COPD exacerbation ([place]) [J44.1] Acute on chronic respiratory failure with hypoxia (HCC) [J96.21] Acute respiratory failure with hypoxia and hypercapnia (HCC) [J96.01, J96.02] Acute respiratory failure with hypoxia ([place]) [J96.01] Patient Active Problem List   Diagnosis Date Noted  . Acute respiratory failure with hypoxia ([place]) 09/11/2019  . Protein-calorie malnutrition, severe 09/11/2019  . Acute respiratory failure with hypoxia and hypercapnia ([place]) 09/10/2019  . COPD with acute exacerbation ([place]) 09/10/2019  . History of gastric cancer   . Gastric mass   . Dysphagia   . Chronic hypoxemic respiratory failure ([place]) 03/13/2018  . S/P gastric surgery 01/22/2018  . Nausea 12/20/2017  . 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 S/P vascular surgery 11/15/2017  .  Pneumonia 09/10/2017  . Acute on chronic respiratory failure with hypoxia ([place]) 09/10/2017  . Exercise hypoxemia 09/04/2017  . Moderate protein-calorie malnutrition ([place]) 07/24/2017  . Port-A-Cath in place 07/06/2017  . DOE (dyspnea on exertion) 06/12/2017  . Atypical chest pain 06/12/2017  . Essential hypertension 06/12/2017  . Preop cardiovascular exam 06/12/2017  .  Gastric adenocarcinoma ([place]) 06/04/2017  . Gastritis and gastroduodenitis   . Atherosclerosis of native arteries of extremity with intermittent claudication ([place]) 05/03/2017  . Critical lower limb ischemia 05/03/2017  . Abnormal CT of the chest 08/02/2015  . Underweight 03/30/2015  . COPD (chronic obstructive pulmonary disease) with emphysema ([place]) 02/16/2015  . Weight loss 02/16/2015  . Cigarette smoker 02/16/2015   PCP:  Patient, No Pcp Per Pharmacy:   [facility], [place] [place]. [place] [address] Phone: [phone] Fax: [phone]     Social Determinants of Health (SDOH) Interventions    Readmission Risk Interventions No flowsheet data found.

## 2019-09-15 LAB — CULTURE, BLOOD (ROUTINE X 2)
Culture: NO GROWTH
Special Requests: ADEQUATE

## 2019-09-16 LAB — CULTURE, BLOOD (ROUTINE X 2)
Culture: NO GROWTH
Special Requests: ADEQUATE

## 2019-09-29 ENCOUNTER — Other Ambulatory Visit: Payer: Self-pay | Admitting: *Deleted

## 2019-09-29 ENCOUNTER — Telehealth (HOSPITAL_COMMUNITY): Payer: Self-pay

## 2019-09-29 DIAGNOSIS — I70229 Atherosclerosis of native arteries of extremities with rest pain, unspecified extremity: Secondary | ICD-10-CM

## 2019-09-29 DIAGNOSIS — I998 Other disorder of circulatory system: Secondary | ICD-10-CM

## 2019-09-29 DIAGNOSIS — I739 Peripheral vascular disease, unspecified: Secondary | ICD-10-CM

## 2019-09-29 DIAGNOSIS — I70213 Atherosclerosis of native arteries of extremities with intermittent claudication, bilateral legs: Secondary | ICD-10-CM

## 2019-09-29 NOTE — Telephone Encounter (Signed)

## 2019-09-30 ENCOUNTER — Encounter: Payer: Self-pay | Admitting: Vascular Surgery

## 2019-09-30 ENCOUNTER — Ambulatory Visit (INDEPENDENT_AMBULATORY_CARE_PROVIDER_SITE_OTHER)
Admission: RE | Admit: 2019-09-30 | Discharge: 2019-09-30 | Disposition: A | Discharge status: Home / Self Care | Payer: Medicare Other | Source: Ambulatory Visit | Attending: Vascular Surgery | Admitting: Vascular Surgery

## 2019-09-30 ENCOUNTER — Ambulatory Visit (HOSPITAL_COMMUNITY)
Admission: RE | Admit: 2019-09-30 | Discharge: 2019-09-30 | Disposition: A | Discharge status: Home / Self Care | Payer: Medicare Other | Source: Ambulatory Visit | Attending: Vascular Surgery | Admitting: Vascular Surgery

## 2019-09-30 ENCOUNTER — Other Ambulatory Visit: Payer: Self-pay

## 2019-09-30 ENCOUNTER — Ambulatory Visit (INDEPENDENT_AMBULATORY_CARE_PROVIDER_SITE_OTHER): Payer: Medicare Other | Admitting: Vascular Surgery

## 2019-09-30 VITALS — BP 121/81 | HR 104 | Temp 98.1°F | Resp 18 | Ht 65.0 in | Wt 100.0 lb

## 2019-09-30 DIAGNOSIS — I998 Other disorder of circulatory system: Secondary | ICD-10-CM

## 2019-09-30 DIAGNOSIS — I70213 Atherosclerosis of native arteries of extremities with intermittent claudication, bilateral legs: Secondary | ICD-10-CM | POA: Insufficient documentation

## 2019-09-30 DIAGNOSIS — I70229 Atherosclerosis of native arteries of extremities with rest pain, unspecified extremity: Secondary | ICD-10-CM

## 2019-09-30 DIAGNOSIS — I739 Peripheral vascular disease, unspecified: Secondary | ICD-10-CM

## 2019-09-30 NOTE — Progress Notes (Signed)
Patient name: Bobby Caldwell MRN: [id number] DOB: 12/24/1955 Sex: male  REASON FOR VISIT: 56-month follow-up with surveillance for left iliac stent and left to right femorofemoral bypass  HPI: Bobby Caldwell is a 64 y.o. male that presents for 68-month interval follow-up for ongoing surveillance of his left iliac stent and left to right femoral-femoral bypass in the setting of intermittent claudication with rest pain.  His surgery was previously performed by Dr. Bridgett Larsson in 2018.  Patient reports no new issues in the last 6 months.  States he has not been doing much.  Breathing issues still limit his activity with severe COPD.  No leg pain when walking, although somewhat limited activity.  Still smoking 7 cigarettes daily.    Past Medical History:  Diagnosis Date  . COPD (chronic obstructive pulmonary disease) ([place])   . Dyspnea   . PAD (peripheral artery disease) ([place])   . Stomach cancer ([place])   . Tobacco abuse   . Weight loss     Past Surgical History:  Procedure Laterality Date  . ABDOMINAL AORTOGRAM W/LOWER EXTREMITY N/A 05/03/2017   Procedure: ABDOMINAL AORTOGRAM W/LOWER EXTREMITY;  Surgeon: Conrad [place], MD;  Location: [facility];  Service: Cardiovascular;  Laterality: N/A;  . BIOPSY  08/22/2018   Procedure: BIOPSY;  Surgeon: [name], MD;  Location: [facility];  Service: Endoscopy;;  . BIOPSY  07/31/2019   Procedure: BIOPSY;  Surgeon: [name], MD;  [facility]: [facility];  Service: Endoscopy;;  . ESOPHAGOGASTRODUODENOSCOPY     05/24/17  . ESOPHAGOGASTRODUODENOSCOPY (EGD) WITH PROPOFOL N/A 05/24/2017   Procedure: ESOPHAGOGASTRODUODENOSCOPY (EGD) WITH PROPOFOL;  Surgeon: [name], MD;  [facility];  Service: Endoscopy;  Laterality: N/A;  . ESOPHAGOGASTRODUODENOSCOPY (EGD) WITH PROPOFOL N/A 08/22/2018   Procedure: ESOPHAGOGASTRODUODENOSCOPY (EGD) WITH PROPOFOL;  Surgeon: [name], MD;  [facility];  Service: Endoscopy;   Laterality: N/A;  . ESOPHAGOGASTRODUODENOSCOPY (EGD) WITH PROPOFOL N/A 11/28/2018   Procedure: ESOPHAGOGASTRODUODENOSCOPY (EGD) WITH PROPOFOL;  Surgeon: [name], MD;  [facility];  Service: Endoscopy;  Laterality: N/A;  . ESOPHAGOGASTRODUODENOSCOPY (EGD) WITH PROPOFOL N/A 07/31/2019   Procedure: ESOPHAGOGASTRODUODENOSCOPY (EGD) WITH PROPOFOL;  Surgeon: [name], MD;  [facility];  Service: Endoscopy;  Laterality: N/A;  . EUS N/A 06/07/2017   Procedure: UPPER ENDOSCOPIC ULTRASOUND (EUS) RADIAL;  Surgeon: [name], MD;  [facility];  Service: Endoscopy;  Laterality: N/A;  . FEMORAL-FEMORAL BYPASS GRAFT Bilateral 05/08/2017   Procedure: LEFT TO RIGHT BYPASS GRAFT FEMORAL-FEMORAL ARTERY;  Surgeon: Conrad [place], MD;  Location: [facility];  Service: Vascular;  Laterality: Bilateral;  . IR FLUORO GUIDE PORT INSERTION RIGHT  07/05/2017  . IR US GUIDE VASC ACCESS RIGHT  07/05/2017  . LAPAROSCOPIC INSERTION GASTROSTOMY TUBE N/A 11/27/2017   Procedure: JEJUNOSTOMY TUBE PLACEMENT;  Surgeon: [name], MD;  Location: [facility];  Service: General;  Laterality: N/A;  . LAPAROSCOPIC PARTIAL GASTRECTOMY N/A 11/27/2017   Procedure: PARTIAL GASTRECTOMY;  Surgeon: [name], MD;  Location: [facility];  Service: General;  Laterality: N/A;  . LAPAROSCOPY N/A 11/27/2017   Procedure: LAPAROSCOPY DIAGNOSTIC;  Surgeon: [name], MD;  Location: [facility];  Service: General;  Laterality: N/A;  . NM MYOVIEW LTD  06/2017   LOW RISK. Small basal inferoseptal defect thought to be related to artifact (although cannot exclude prior infarct). Normal wall motion i in this area would suggest artifact not infarct. Otherwise no ischemia or infarction.  Marland Kitchen PERIPHERAL VASCULAR INTERVENTION  Left 05/03/2017   Procedure: PERIPHERAL VASCULAR INTERVENTION;  Surgeon: Conrad [place], MD;  Location: [facility];  Service: Cardiovascular;  Laterality: Left;  common iliac  . POLYPECTOMY   11/28/2018   Procedure: POLYPECTOMY;  Surgeon: [name], MD;  [facility];  Service: Endoscopy;;  . TRANSTHORACIC ECHOCARDIOGRAM  06/26/2017   Normal LV size and function. EF 60-65%. No wall motion abnormalities. GR 1 DD. Mild LA dilation    Family History  Family history unknown: Yes    SOCIAL HISTORY: Social History   Tobacco Use  . Smoking status: Current Every Day Smoker    Packs/day: 0.50    Years: 50.00    Pack years: 25.00    Types: Cigarettes    Last attempt to quit: 11/2017    Years since quitting: 1.8  . Smokeless tobacco: Never Used  . Tobacco comment: 10 cigs per day 08/05/19  Substance Use Topics  . Alcohol use: No    Alcohol/week: 0.0 standard drinks    No Known Allergies  Current Outpatient Medications  Medication Sig Dispense Refill  . Amino Acids-Protein Hydrolys (FEEDING SUPPLEMENT, PRO-STAT SUGAR FREE 64,) LIQD Take 30 mLs by mouth 2 (two) times daily. 887 mL 0  . budesonide-formoterol (SYMBICORT) 160-4.5 MCG/ACT inhaler Inhale 2 puffs into the lungs 2 (two) times daily. 1 Inhaler 0  . feeding supplement, ENSURE ENLIVE, (ENSURE ENLIVE) LIQD Take 237 mLs by mouth 2 (two) times daily between meals. 14222 mL 0  . ipratropium-albuterol (DUONEB) 0.5-2.5 (3) MG/3ML SOLN Inhale 3 mLs into the lungs 4 (four) times daily. Dx:J44.9 (Patient taking differently: Inhale 3 mLs into the lungs every 4 (four) hours. Dx:J44.9) 360 mL 3  . mirtazapine (REMERON) 15 MG tablet Take 1 tablet (15 mg total) by mouth at bedtime. 30 tablet 5  . Multiple Vitamin (MULTIVITAMIN WITH MINERALS) TABS tablet Take 1 tablet by mouth daily. 30 tablet 0  . Tiotropium Bromide Monohydrate (SPIRIVA RESPIMAT) 2.5 MCG/ACT AERS Inhale 2 puffs into the lungs daily. 4 g 0   No current facility-administered medications for this visit.    REVIEW OF SYSTEMS:  [X]  denotes positive finding, [ ]  denotes negative finding Cardiac  Comments:  Chest pain or chest pressure:    Shortness of  breath upon exertion:    Short of breath when lying flat:    Irregular heart rhythm:        Vascular    Pain in calf, thigh, or hip brought on by ambulation:    Pain in feet at night that wakes you up from your sleep:     Blood clot in your veins:    Leg swelling:         Pulmonary    Oxygen at home:    Productive cough:     Wheezing:         Neurologic    Sudden weakness in arms or legs:     Sudden numbness in arms or legs:     Sudden onset of difficulty speaking or slurred speech:    Temporary loss of vision in one eye:     Problems with dizziness:         Gastrointestinal    Blood in stool:     Vomited blood:         Genitourinary    Burning when urinating:     Blood in urine:        Psychiatric    Major depression:  Hematologic    Bleeding problems:    Problems with blood clotting too easily:        Skin    Rashes or ulcers:        Constitutional    Fever or chills:      PHYSICAL EXAM: Vitals:   09/30/19 0916  BP: 121/81  Pulse: (!) 104  Resp: 18  Temp: 98.1 F (36.7 C)  TempSrc: Temporal  SpO2: 99%  Weight: 100 lb (45.4 kg)  Height: 5\' 5"  (1.651 m)    GENERAL: The patient is a well-nourished male, in no acute distress. The vital signs are documented above. CARDIAC: There is a regular rate and rhythm.  VASCULAR:  2+ femoral pulse in bilateral groins with well healed incisions Palpable pulse in femoral femoral bypass graft Feet warm, no palpable pedal pulses on my exam PULMONARY: There is good air exchange bilaterally without wheezing or rales. ABDOMEN: Soft and non-tender with normal pitched bowel sounds. Well healed midline incision. MUSCULOSKELETAL: There are no major deformities or cyanosis.   DATA:   I independently reviewed his noninvasive imaging that shows patent left to right femoral-femoral bypass.  The left iliac mid stent has velocity of 204 but triphasic waveform.  Fem fem bypass patent with elevated velocity in proximal  graft at 304 (down from 340 approximately 6 months ago).  No demonstrated outflow disease in fem fem bypass today.  ABI's 1+ and triphasic at the ankles.  Assessment/Plan:  64 year old male presents for ongoing interval surveillance for left common iliac stent with a left to right femoral-femoral bypass for claudication and intermittent rest pain.  Surgery previously performed by Dr. Bridgett Larsson.  No new issues over the last 6 months.  States his legs feel fine.  We have been watching an inflow and outflow stenosis in the femorofemoral bypass graft and that is in the setting of continued tobacco abuse.  Fortunately on evaluation today he continues to have good femoral pulses and a good pulse in the bypass graft.  He has no symptoms.  He is severely limited with his pulmonary disease and is on home oxygen.  Given no progression on surveillance with normal ABIs and no symptoms I recommended continued close interval surveillance and I will see him again in 6 months. Discussed with ongoing tobacco abuse high risk for graft failure in the future.    Marty Heck, MD [facility] Office: [place]

## 2019-10-02 ENCOUNTER — Other Ambulatory Visit: Payer: Self-pay | Admitting: *Deleted

## 2019-10-02 DIAGNOSIS — I739 Peripheral vascular disease, unspecified: Secondary | ICD-10-CM

## 2019-10-02 DIAGNOSIS — Z48812 Encounter for surgical aftercare following surgery on the circulatory system: Secondary | ICD-10-CM

## 2019-10-02 DIAGNOSIS — I70213 Atherosclerosis of native arteries of extremities with intermittent claudication, bilateral legs: Secondary | ICD-10-CM

## 2019-10-09 ENCOUNTER — Telehealth: Payer: Self-pay | Admitting: Pulmonary Disease

## 2019-10-09 MED ORDER — BUDESONIDE-FORMOTEROL FUMARATE 160-4.5 MCG/ACT IN AERO: 2.0000 | INHALATION_SPRAY | Freq: Two times a day (BID) | RESPIRATORY_TRACT | 5 refills | Status: DC

## 2019-10-09 MED ORDER — IPRATROPIUM-ALBUTEROL 0.5-2.5 (3) MG/3ML IN SOLN: 3.0000 mL | Freq: Four times a day (QID) | RESPIRATORY_TRACT | 5 refills | Status: DC

## 2019-10-09 MED ORDER — SPIRIVA RESPIMAT 2.5 MCG/ACT IN AERS: 2.0000 | INHALATION_SPRAY | Freq: Every day | RESPIRATORY_TRACT | 5 refills | Status: DC

## 2019-10-09 NOTE — Telephone Encounter (Signed)
Spoke with Patient in lobby.  Patient requested a refill on Duo Nebs, Symbicort, and Spiriva to pharmacy.  Requested prescriptions sent to pharmacy. Nothing further at this time.

## 2019-10-09 NOTE — Telephone Encounter (Signed)
Patient needs a monthly refill of Albuterol solution. Patient came to office in person, asking for this.  Please advise. Please call when prescription is filled.  CB#[phone]

## 2019-10-12 ENCOUNTER — Inpatient Hospital Stay (HOSPITAL_COMMUNITY)
Admission: EM | Admit: 2019-10-12 | Discharge: 2019-10-15 | DRG: 190 | Disposition: A | Discharge status: Home Health | Payer: Medicare Other | Attending: Internal Medicine | Admitting: Internal Medicine

## 2019-10-12 ENCOUNTER — Other Ambulatory Visit: Payer: Self-pay

## 2019-10-12 ENCOUNTER — Emergency Department (HOSPITAL_COMMUNITY): Payer: Medicare Other

## 2019-10-12 ENCOUNTER — Encounter (HOSPITAL_COMMUNITY): Payer: Self-pay

## 2019-10-12 DIAGNOSIS — J9622 Acute and chronic respiratory failure with hypercapnia: Secondary | ICD-10-CM | POA: Diagnosis present

## 2019-10-12 DIAGNOSIS — I272 Pulmonary hypertension, unspecified: Secondary | ICD-10-CM | POA: Diagnosis present

## 2019-10-12 DIAGNOSIS — I739 Peripheral vascular disease, unspecified: Secondary | ICD-10-CM | POA: Diagnosis present

## 2019-10-12 DIAGNOSIS — Z716 Tobacco abuse counseling: Secondary | ICD-10-CM | POA: Diagnosis not present

## 2019-10-12 DIAGNOSIS — R0689 Other abnormalities of breathing: Secondary | ICD-10-CM

## 2019-10-12 DIAGNOSIS — R410 Disorientation, unspecified: Secondary | ICD-10-CM | POA: Diagnosis present

## 2019-10-12 DIAGNOSIS — Z681 Body mass index (BMI) 19 or less, adult: Secondary | ICD-10-CM

## 2019-10-12 DIAGNOSIS — E871 Hypo-osmolality and hyponatremia: Secondary | ICD-10-CM | POA: Diagnosis present

## 2019-10-12 DIAGNOSIS — Z79899 Other long term (current) drug therapy: Secondary | ICD-10-CM

## 2019-10-12 DIAGNOSIS — G9341 Metabolic encephalopathy: Secondary | ICD-10-CM | POA: Diagnosis present

## 2019-10-12 DIAGNOSIS — R5381 Other malaise: Secondary | ICD-10-CM | POA: Diagnosis present

## 2019-10-12 DIAGNOSIS — Z903 Acquired absence of stomach [part of]: Secondary | ICD-10-CM

## 2019-10-12 DIAGNOSIS — Z85028 Personal history of other malignant neoplasm of stomach: Secondary | ICD-10-CM | POA: Diagnosis not present

## 2019-10-12 DIAGNOSIS — Z9981 Dependence on supplemental oxygen: Secondary | ICD-10-CM

## 2019-10-12 DIAGNOSIS — E43 Unspecified severe protein-calorie malnutrition: Secondary | ICD-10-CM | POA: Diagnosis present

## 2019-10-12 DIAGNOSIS — F17210 Nicotine dependence, cigarettes, uncomplicated: Secondary | ICD-10-CM | POA: Diagnosis present

## 2019-10-12 DIAGNOSIS — I10 Essential (primary) hypertension: Secondary | ICD-10-CM | POA: Diagnosis present

## 2019-10-12 DIAGNOSIS — J441 Chronic obstructive pulmonary disease with (acute) exacerbation: Secondary | ICD-10-CM | POA: Diagnosis present

## 2019-10-12 DIAGNOSIS — J9621 Acute and chronic respiratory failure with hypoxia: Secondary | ICD-10-CM | POA: Diagnosis present

## 2019-10-12 DIAGNOSIS — Z20822 Contact with and (suspected) exposure to covid-19: Secondary | ICD-10-CM | POA: Diagnosis present

## 2019-10-12 DIAGNOSIS — Z8616 Personal history of COVID-19: Secondary | ICD-10-CM | POA: Diagnosis present

## 2019-10-12 DIAGNOSIS — Z7951 Long term (current) use of inhaled steroids: Secondary | ICD-10-CM

## 2019-10-12 DIAGNOSIS — R000 Tachycardia, unspecified: Secondary | ICD-10-CM | POA: Diagnosis not present

## 2019-10-12 DIAGNOSIS — Z9221 Personal history of antineoplastic chemotherapy: Secondary | ICD-10-CM | POA: Diagnosis not present

## 2019-10-12 LAB — URINALYSIS, ROUTINE W REFLEX MICROSCOPIC
Bilirubin Urine: NEGATIVE
Glucose, UA: NEGATIVE mg/dL
Hgb urine dipstick: NEGATIVE
Ketones, ur: 20 mg/dL — AB
Leukocytes,Ua: NEGATIVE
Nitrite: NEGATIVE
Protein, ur: NEGATIVE mg/dL
Specific Gravity, Urine: 1.014 (ref 1.005–1.030)
pH: 6 (ref 5.0–8.0)

## 2019-10-12 LAB — POCT I-STAT 7, (LYTES, BLD GAS, ICA,H+H)
Acid-Base Excess: 8 mmol/L — ABNORMAL HIGH (ref 0.0–2.0)
Bicarbonate: 35.5 mmol/L — ABNORMAL HIGH (ref 20.0–28.0)
Calcium, Ion: 0.94 mmol/L — ABNORMAL LOW (ref 1.15–1.40)
HCT: 40 % (ref 39.0–52.0)
Hemoglobin: 13.6 g/dL (ref 13.0–17.0)
O2 Saturation: 56 %
Potassium: 3.8 mmol/L (ref 3.5–5.1)
Sodium: 122 mmol/L — ABNORMAL LOW (ref 135–145)
TCO2: 37 mmol/L — ABNORMAL HIGH (ref 22–32)
pCO2 arterial: 62.8 mmHg — ABNORMAL HIGH (ref 32.0–48.0)
pH, Arterial: 7.36 (ref 7.350–7.450)
pO2, Arterial: 32 mmHg — CL (ref 83.0–108.0)

## 2019-10-12 LAB — POCT I-STAT EG7
Acid-Base Excess: 6 mmol/L — ABNORMAL HIGH (ref 0.0–2.0)
Bicarbonate: 38.4 mmol/L — ABNORMAL HIGH (ref 20.0–28.0)
Calcium, Ion: 1.16 mmol/L (ref 1.15–1.40)
HCT: 52 % (ref 39.0–52.0)
Hemoglobin: 17.7 g/dL — ABNORMAL HIGH (ref 13.0–17.0)
O2 Saturation: 59 %
Potassium: 4.3 mmol/L (ref 3.5–5.1)
Sodium: 127 mmol/L — ABNORMAL LOW (ref 135–145)
TCO2: 41 mmol/L — ABNORMAL HIGH (ref 22–32)
pCO2, Ven: 90.4 mmHg (ref 44.0–60.0)
pH, Ven: 7.237 — ABNORMAL LOW (ref 7.250–7.430)
pO2, Ven: 39 mmHg (ref 32.0–45.0)

## 2019-10-12 LAB — CBC WITH DIFFERENTIAL/PLATELET
Abs Immature Granulocytes: 0.04 10*3/uL (ref 0.00–0.07)
Basophils Absolute: 0.1 10*3/uL (ref 0.0–0.1)
Basophils Relative: 1 %
Eosinophils Absolute: 0 10*3/uL (ref 0.0–0.5)
Eosinophils Relative: 0 %
HCT: 48.8 % (ref 39.0–52.0)
Hemoglobin: 15.9 g/dL (ref 13.0–17.0)
Immature Granulocytes: 0 %
Lymphocytes Relative: 12 %
Lymphs Abs: 1.2 10*3/uL (ref 0.7–4.0)
MCH: 27.9 pg (ref 26.0–34.0)
MCHC: 32.6 g/dL (ref 30.0–36.0)
MCV: 85.6 fL (ref 80.0–100.0)
Monocytes Absolute: 0.6 10*3/uL (ref 0.1–1.0)
Monocytes Relative: 6 %
Neutro Abs: 7.9 10*3/uL — ABNORMAL HIGH (ref 1.7–7.7)
Neutrophils Relative %: 81 %
Platelets: 303 10*3/uL (ref 150–400)
RBC: 5.7 MIL/uL (ref 4.22–5.81)
RDW: 13.2 % (ref 11.5–15.5)
WBC: 9.7 10*3/uL (ref 4.0–10.5)
nRBC: 0 % (ref 0.0–0.2)

## 2019-10-12 LAB — COMPREHENSIVE METABOLIC PANEL
ALT: 18 U/L (ref 0–44)
AST: 21 U/L (ref 15–41)
Albumin: 3.5 g/dL (ref 3.5–5.0)
Alkaline Phosphatase: 82 U/L (ref 38–126)
Anion gap: 12 (ref 5–15)
BUN: 10 mg/dL (ref 8–23)
CO2: 34 mmol/L — ABNORMAL HIGH (ref 22–32)
Calcium: 8.9 mg/dL (ref 8.9–10.3)
Chloride: 83 mmol/L — ABNORMAL LOW (ref 98–111)
Creatinine, Ser: 0.65 mg/dL (ref 0.61–1.24)
GFR calc Af Amer: >60 mL/min (ref >60)
GFR calc non Af Amer: >60 mL/min (ref >60)
Glucose, Bld: 138 mg/dL — ABNORMAL HIGH (ref 70–99)
Potassium: 4.4 mmol/L (ref 3.5–5.1)
Sodium: 129 mmol/L — ABNORMAL LOW (ref 135–145)
Total Bilirubin: 0.8 mg/dL (ref 0.3–1.2)
Total Protein: 6.4 g/dL — ABNORMAL LOW (ref 6.5–8.1)

## 2019-10-12 LAB — GLUCOSE, CSF: Glucose, CSF: 83 mg/dL — ABNORMAL HIGH (ref 40–70)

## 2019-10-12 LAB — PROTEIN, CSF: Total  Protein, CSF: 57 mg/dL — ABNORMAL HIGH (ref 15–45)

## 2019-10-12 LAB — PROTIME-INR
INR: 1.1 (ref 0.8–1.2)
Prothrombin Time: 14 seconds (ref 11.4–15.2)

## 2019-10-12 LAB — APTT: aPTT: 27 s (ref 24–36)

## 2019-10-12 LAB — MRSA PCR SCREENING: MRSA by PCR: NEGATIVE

## 2019-10-12 LAB — SARS CORONAVIRUS 2 (TAT 6-24 HRS): SARS Coronavirus 2: NEGATIVE

## 2019-10-12 LAB — LACTIC ACID, PLASMA
Lactic Acid, Venous: 1.7 mmol/L (ref 0.5–1.9)
Lactic Acid, Venous: 1.4 mmol/L (ref 0.5–1.9)

## 2019-10-12 MED ORDER — NICOTINE 14 MG/24HR TD PT24
14.0000 mg | MEDICATED_PATCH | Freq: Every day | TRANSDERMAL | Status: DC
  Administered 2019-10-12 – 2019-10-15 (×4): 14 mg via TRANSDERMAL
  Filled 2019-10-12 (×5): qty 1

## 2019-10-12 MED ORDER — ALBUTEROL SULFATE HFA 108 (90 BASE) MCG/ACT IN AERS
2.0000 | INHALATION_SPRAY | Freq: Once | RESPIRATORY_TRACT | Status: DC
  Filled 2019-10-12: qty 6.7

## 2019-10-12 MED ORDER — SODIUM CHLORIDE 0.9 % IV SOLN
2.0000 g | Freq: Once | INTRAVENOUS | Status: AC
  Administered 2019-10-12: 2 g via INTRAVENOUS
  Filled 2019-10-12: qty 2

## 2019-10-12 MED ORDER — ACETAMINOPHEN 650 MG RE SUPP: 650.0000 mg | Freq: Four times a day (QID) | RECTAL | Status: DC | PRN

## 2019-10-12 MED ORDER — LACTATED RINGERS IV BOLUS (SEPSIS): 500.0000 mL | Freq: Once | INTRAVENOUS | Status: DC

## 2019-10-12 MED ORDER — ACETAMINOPHEN 325 MG PO TABS
650.0000 mg | ORAL_TABLET | Freq: Four times a day (QID) | ORAL | Status: DC | PRN
  Administered 2019-10-14 – 2019-10-15 (×3): 650 mg via ORAL
  Filled 2019-10-12 (×3): qty 2

## 2019-10-12 MED ORDER — ONDANSETRON HCL 4 MG/2ML IJ SOLN: 4.0000 mg | Freq: Four times a day (QID) | INTRAMUSCULAR | Status: DC | PRN

## 2019-10-12 MED ORDER — METHYLPREDNISOLONE SODIUM SUCC 125 MG IJ SOLR
60.0000 mg | Freq: Three times a day (TID) | INTRAMUSCULAR | Status: DC
  Administered 2019-10-12 – 2019-10-14 (×6): 60 mg via INTRAVENOUS
  Filled 2019-10-12 (×6): qty 2

## 2019-10-12 MED ORDER — IPRATROPIUM-ALBUTEROL 0.5-2.5 (3) MG/3ML IN SOLN
3.0000 mL | Freq: Four times a day (QID) | RESPIRATORY_TRACT | Status: DC
  Administered 2019-10-12 – 2019-10-14 (×6): 3 mL via RESPIRATORY_TRACT
  Filled 2019-10-12 (×6): qty 3

## 2019-10-12 MED ORDER — MIRTAZAPINE 15 MG PO TABS
15.0000 mg | ORAL_TABLET | Freq: Every day | ORAL | Status: DC
  Administered 2019-10-13 – 2019-10-14 (×2): 15 mg via ORAL
  Filled 2019-10-12 (×2): qty 1

## 2019-10-12 MED ORDER — VANCOMYCIN HCL 750 MG/150ML IV SOLN
750.0000 mg | INTRAVENOUS | Status: DC
  Administered 2019-10-13: 750 mg via INTRAVENOUS
  Filled 2019-10-12 (×2): qty 150

## 2019-10-12 MED ORDER — ONDANSETRON HCL 4 MG PO TABS: 4.0000 mg | ORAL_TABLET | Freq: Four times a day (QID) | ORAL | Status: DC | PRN

## 2019-10-12 MED ORDER — SODIUM CHLORIDE 0.9 % IV SOLN
2.0000 g | Freq: Three times a day (TID) | INTRAVENOUS | Status: DC
  Administered 2019-10-12 – 2019-10-14 (×5): 2 g via INTRAVENOUS
  Filled 2019-10-12 (×7): qty 2

## 2019-10-12 MED ORDER — BUDESONIDE 0.5 MG/2ML IN SUSP
0.5000 mg | Freq: Two times a day (BID) | RESPIRATORY_TRACT | Status: DC
  Administered 2019-10-12 – 2019-10-15 (×6): 0.5 mg via RESPIRATORY_TRACT
  Filled 2019-10-12 (×6): qty 2

## 2019-10-12 MED ORDER — MEGESTROL ACETATE 400 MG/10ML PO SUSP
200.0000 mg | Freq: Four times a day (QID) | ORAL | Status: DC
  Filled 2019-10-12 (×7): qty 5

## 2019-10-12 MED ORDER — METHYLPREDNISOLONE SODIUM SUCC 125 MG IJ SOLR: 60.0000 mg | Freq: Three times a day (TID) | INTRAMUSCULAR | Status: DC

## 2019-10-12 MED ORDER — VANCOMYCIN HCL IN DEXTROSE 1-5 GM/200ML-% IV SOLN
1000.0000 mg | Freq: Once | INTRAVENOUS | Status: AC
  Administered 2019-10-12: 1000 mg via INTRAVENOUS
  Filled 2019-10-12: qty 200

## 2019-10-12 MED ORDER — METRONIDAZOLE IN NACL 5-0.79 MG/ML-% IV SOLN
500.0000 mg | Freq: Once | INTRAVENOUS | Status: AC
  Administered 2019-10-12: 500 mg via INTRAVENOUS
  Filled 2019-10-12: qty 100

## 2019-10-12 MED ORDER — ENOXAPARIN SODIUM 40 MG/0.4ML ~~LOC~~ SOLN
40.0000 mg | SUBCUTANEOUS | Status: DC
  Administered 2019-10-12 – 2019-10-14 (×3): 40 mg via SUBCUTANEOUS
  Filled 2019-10-12 (×3): qty 0.4

## 2019-10-12 MED ORDER — SODIUM CHLORIDE 0.9% FLUSH
3.0000 mL | Freq: Two times a day (BID) | INTRAVENOUS | Status: DC
  Administered 2019-10-12 – 2019-10-14 (×4): 3 mL via INTRAVENOUS

## 2019-10-12 MED ORDER — DEXAMETHASONE SODIUM PHOSPHATE 10 MG/ML IJ SOLN
10.0000 mg | Freq: Once | INTRAMUSCULAR | Status: AC
  Administered 2019-10-12: 10 mg via INTRAVENOUS
  Filled 2019-10-12: qty 1

## 2019-10-12 MED ORDER — ARFORMOTEROL TARTRATE 15 MCG/2ML IN NEBU
15.0000 ug | INHALATION_SOLUTION | Freq: Two times a day (BID) | RESPIRATORY_TRACT | Status: DC
  Administered 2019-10-12 – 2019-10-15 (×6): 15 ug via RESPIRATORY_TRACT
  Filled 2019-10-12 (×6): qty 2

## 2019-10-12 MED ORDER — LACTATED RINGERS IV BOLUS (SEPSIS)
1000.0000 mL | Freq: Once | INTRAVENOUS | Status: AC
  Administered 2019-10-12: 1000 mL via INTRAVENOUS

## 2019-10-12 MED ORDER — MEGESTROL ACETATE 40 MG/ML PO SUSP
200.0000 mg | Freq: Four times a day (QID) | ORAL | Status: DC
  Filled 2019-10-12 (×2): qty 5

## 2019-10-12 NOTE — ED Provider Notes (Signed)
[facility]Y DEPARTMENT Provider Note   CSN: [id number] Arrival date & time: 10/12/19  [id number]     History Chief Complaint  Patient presents with  . Fever  . Fatigue    Talley Bobby Caldwell is Bobby 64 y.o. male.  The history is provided by medical Bobby Caldwell, Bobby Bobby Caldwell old [place] male with history of stomach cancer currently undergoing treatment, COPD, on 4 L of O2 at home, [facility] brought here for via EMS from home for evaluation of altered mental status.  Unable to obtain much history from patient as he is not following command.  I was able to talk to family member over the phone.  Her daughter, for the past 2 days patient is becoming increasingly weak, shaky, increased shortness of breath, confusion, talk with does not make any sense and with increased moaning.  His wife was concerned prompting EMS to be called.  Patient has 1 similar episode Bobby month ago when he was admitted to the hospital.  He is on home oxygen at 2 L but family felt that is not providing adequate support of his oxygen.  He also receiving chemotherapy.  He has had COVID-19 in Dec/January and recently family has not noticed any Covid symptoms such as worsening cough.  No report of any urinary symptom.  No recent medication changes.  When MS arrived, patient was noted to have an elevated temperature of 101, Tylenol was given around.      Past Medical History:  Diagnosis Date  . COPD (chronic obstructive pulmonary disease) ([place])   . Dyspnea   . PAD (peripheral artery disease) ([place])   . Stomach cancer ([place])   . Tobacco abuse   . Weight loss     Patient Active Problem List   Diagnosis Date Noted  . Acute respiratory failure with hypoxia ([place]) 09/11/2019  . Protein-calorie malnutrition, severe 09/11/2019  . Acute respiratory failure with hypoxia and hypercapnia ([place]) 09/10/2019  .  COPD with acute exacerbation ([place]) 09/10/2019  . History of gastric cancer   . Gastric mass   . Dysphagia   . Chronic hypoxemic respiratory failure ([place]) 03/13/2018  . S/P gastric surgery 01/22/2018  . Nausea 12/20/2017  . 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 S/P vascular surgery 11/15/2017  . Pneumonia 09/10/2017  . Acute on chronic respiratory failure with hypoxia ([place]) 09/10/2017  . Exercise hypoxemia 09/04/2017  . Moderate protein-calorie malnutrition ([place]) 07/24/2017  . Port-Bobby-Cath in place 07/06/2017  . DOE (dyspnea on exertion) 06/12/2017  . Atypical chest pain 06/12/2017  . Essential hypertension 06/12/2017  . Preop cardiovascular exam 06/12/2017  . Gastric adenocarcinoma ([place]) 06/04/2017  . Gastritis and gastroduodenitis   . Atherosclerosis of native arteries of extremity with intermittent claudication ([place]) 05/03/2017  . Critical lower limb ischemia 05/03/2017  . Abnormal CT of the chest 08/02/2015  . Underweight 03/30/2015  . COPD (chronic obstructive pulmonary disease) with emphysema ([place]) 02/16/2015  . Weight loss 02/16/2015  . Cigarette smoker 02/16/2015    Past Surgical History:  Procedure Laterality Date  . ABDOMINAL AORTOGRAM W/LOWER EXTREMITY N/Bobby 05/03/2017   Procedure: ABDOMINAL AORTOGRAM W/LOWER EXTREMITY;  Surgeon: Conrad Pisgah, MD;  Location: [facility];  Service: Cardiovascular;  Laterality: N/Bobby;  . BIOPSY  08/22/2018   Procedure: BIOPSY;  Surgeon: [name], MD;  Location: [facility];  Service: Endoscopy;;  .  BIOPSY  07/31/2019   Procedure: BIOPSY;  Surgeon: [name], MD;  [facility]: [facility];  Service: Endoscopy;;  . ESOPHAGOGASTRODUODENOSCOPY     05/24/17  . ESOPHAGOGASTRODUODENOSCOPY (EGD) WITH PROPOFOL N/Bobby 05/24/2017   Procedure: ESOPHAGOGASTRODUODENOSCOPY (EGD) WITH PROPOFOL;  Surgeon: [name], MD;  [facility];  Service: Endoscopy;  Laterality: N/Bobby;  .  ESOPHAGOGASTRODUODENOSCOPY (EGD) WITH PROPOFOL N/Bobby 08/22/2018   Procedure: ESOPHAGOGASTRODUODENOSCOPY (EGD) WITH PROPOFOL;  Surgeon: [name], MD;  [facility];  Service: Endoscopy;  Laterality: N/Bobby;  . ESOPHAGOGASTRODUODENOSCOPY (EGD) WITH PROPOFOL N/Bobby 11/28/2018   Procedure: ESOPHAGOGASTRODUODENOSCOPY (EGD) WITH PROPOFOL;  Surgeon: [name], MD;  [facility];  Service: Endoscopy;  Laterality: N/Bobby;  . ESOPHAGOGASTRODUODENOSCOPY (EGD) WITH PROPOFOL N/Bobby 07/31/2019   Procedure: ESOPHAGOGASTRODUODENOSCOPY (EGD) WITH PROPOFOL;  Surgeon: [name], MD;  [facility];  Service: Endoscopy;  Laterality: N/Bobby;  . EUS N/Bobby 06/07/2017   Procedure: UPPER ENDOSCOPIC ULTRASOUND (EUS) RADIAL;  Surgeon: [name], MD;  [facility];  Service: Endoscopy;  Laterality: N/Bobby;  . FEMORAL-FEMORAL BYPASS GRAFT Bilateral 05/08/2017   Procedure: LEFT TO RIGHT BYPASS GRAFT FEMORAL-FEMORAL ARTERY;  Surgeon: Conrad Prentice, MD;  Location: [facility];  Service: Vascular;  Laterality: Bilateral;  . IR FLUORO GUIDE PORT INSERTION RIGHT  07/05/2017  . IR US GUIDE VASC ACCESS RIGHT  07/05/2017  . LAPAROSCOPIC INSERTION GASTROSTOMY TUBE N/Bobby 11/27/2017   Procedure: JEJUNOSTOMY TUBE PLACEMENT;  Surgeon: [name], MD;  Location: [facility];  Service: General;  Laterality: N/Bobby;  . LAPAROSCOPIC PARTIAL GASTRECTOMY N/Bobby 11/27/2017   Procedure: PARTIAL GASTRECTOMY;  Surgeon: [name], MD;  Location: [facility];  Service: General;  Laterality: N/Bobby;  . LAPAROSCOPY N/Bobby 11/27/2017   Procedure: LAPAROSCOPY DIAGNOSTIC;  Surgeon: [name], MD;  Location: [facility];  Service: General;  Laterality: N/Bobby;  . NM MYOVIEW LTD  06/2017   LOW RISK. Small basal inferoseptal defect thought to be related to artifact (although cannot exclude prior infarct). Normal wall motion i in this area would suggest artifact not infarct. Otherwise no ischemia or infarction.  Marland Kitchen PERIPHERAL VASCULAR INTERVENTION  Left 05/03/2017   Procedure: PERIPHERAL VASCULAR INTERVENTION;  Surgeon: Conrad [place], MD;  Location: [facility];  Service: Cardiovascular;  Laterality: Left;  common iliac  . POLYPECTOMY  11/28/2018   Procedure: POLYPECTOMY;  Surgeon: [name], MD;  [facility];  Service: Endoscopy;;  . TRANSTHORACIC ECHOCARDIOGRAM  06/26/2017   Normal LV size and function. EF 60-65%. No wall motion abnormalities. GR 1 DD. Mild LA dilation       Family History  Family history unknown: Yes    Social History   Tobacco Use  . Smoking status: Current Every Day Smoker    Packs/day: 0.50    Years: 50.00    Pack years: 25.00    Types: Cigarettes    Last attempt to quit: 11/2017    Years since quitting: 1.9  . Smokeless tobacco: Never Used  . Tobacco comment: 10 cigs per day 08/05/19  Substance Use Topics  . Alcohol use: No    Alcohol/week: 0.0 standard drinks  . Drug use: No    Home Medications Prior to Admission medications   Medication Sig Start Date End Date Taking? Authorizing Provider  Amino Acids-Protein Hydrolys (FEEDING SUPPLEMENT, PRO-STAT SUGAR FREE 64,) LIQD Take 30 mLs by mouth 2 (two) times daily. 09/12/19   Mariel Aloe, MD  budesonide-formoterol (SYMBICORT) 160-4.5 MCG/ACT inhaler Inhale 2 puffs into  the lungs 2 (two) times daily. 10/09/19   Olalere, Ernesto Rutherford, MD  feeding supplement, ENSURE ENLIVE, (ENSURE ENLIVE) LIQD Take 237 mLs by mouth 2 (two) times daily between meals. 09/13/19   Mariel Aloe, MD  ipratropium-albuterol (DUONEB) 0.5-2.5 (3) MG/3ML SOLN Inhale 3 mLs into the lungs 4 (four) times daily. Dx:J44.9 10/09/19   Olalere, Cicero Duck A, MD  mirtazapine (REMERON) 15 MG tablet Take 1 tablet (15 mg total) by mouth at bedtime. 08/22/19   [name], MD  Multiple Vitamin (MULTIVITAMIN WITH MINERALS) TABS tablet Take 1 tablet by mouth daily. 09/13/19   Mariel Aloe, MD  Tiotropium Bromide Monohydrate (SPIRIVA RESPIMAT) 2.5 MCG/ACT AERS Inhale 2 puffs into  the lungs daily. 10/09/19   Laurin Coder, MD    Allergies    Patient has no known allergies.  Review of Systems   Review of Systems  Unable to perform ROS: Mental status change  Constitutional: Positive for fever.    Physical Exam Updated Vital Signs BP (!) 124/91 (BP Location: Left Arm)   Pulse (!) 109   Temp 98.9 F (37.2 C) (Rectal)   Resp (!) 28   Ht 5\' 5"  (1.651 m)   Wt 45.4 kg   SpO2 100%   BMI 16.64 kg/m   Physical Exam Vitals and nursing note reviewed.  Constitutional:      Appearance: He is well-developed.     Comments: Cachectic appearing male appears to be in no acute distress but is altered.  HENT:     Head: Normocephalic and atraumatic.     Mouth/Throat:     Mouth: Mucous membranes are dry.  Eyes:     Extraocular Movements: Extraocular movements intact.     Conjunctiva/sclera: Conjunctivae normal.     Pupils: Pupils are equal, round, and reactive to light.  Cardiovascular:     Rate and Rhythm: Tachycardia present.     Pulses: Normal pulses.     Heart sounds: Normal heart sounds.  Pulmonary:     Effort: Pulmonary effort is normal.     Breath sounds: Normal breath sounds. No wheezing, rhonchi or rales.     Comments: Port-Bobby-Cath noted in right chest no signs of infection. Abdominal:     Palpations: Abdomen is soft.     Tenderness: There is no abdominal tenderness.  Musculoskeletal:        General: No tenderness.     Cervical back: Neck supple. No rigidity.  Skin:    Findings: No rash.  Neurological:     Mental Status: He is disoriented.     GCS: GCS eye subscore is 2. GCS verbal subscore is 4. GCS motor subscore is 5.     Motor: Weakness present.  Psychiatric:        Mood and Affect: Mood normal.     ED Results / Procedures / Treatments   Labs (all labs ordered are listed, but only abnormal results are displayed) Labs Reviewed  COMPREHENSIVE METABOLIC PANEL - Abnormal; Notable for the following components:      Result Value   Sodium  129 (*)    Chloride 83 (*)    CO2 34 (*)    Glucose, Bld 138 (*)    Total Protein 6.4 (*)    All other components within normal limits  CBC WITH DIFFERENTIAL/PLATELET - Abnormal; Notable for the following components:   Neutro Abs 7.9 (*)    All other components within normal limits  URINALYSIS, ROUTINE W REFLEX MICROSCOPIC - Abnormal; Notable for  the following components:   Ketones, ur 20 (*)    All other components within normal limits  POCT I-STAT EG7 - Abnormal; Notable for the following components:   pH, Ven 7.237 (*)    pCO2, Ven 90.4 (*)    Bicarbonate 38.4 (*)    TCO2 41 (*)    Acid-Base Excess 6.0 (*)    Sodium 127 (*)    Hemoglobin 17.7 (*)    All other components within normal limits  POCT I-STAT 7, (LYTES, BLD GAS, ICA,H+H) - Abnormal; Notable for the following components:   pCO2 arterial 62.8 (*)    pO2, Arterial 32.0 (*)    Bicarbonate 35.5 (*)    TCO2 37 (*)    Acid-Base Excess 8.0 (*)    Sodium 122 (*)    Calcium, Ion 0.94 (*)    All other components within normal limits  CULTURE, BLOOD (ROUTINE X 2)  CULTURE, BLOOD (ROUTINE X 2)  URINE CULTURE  SARS CORONAVIRUS 2 (TAT 6-24 HRS)  MRSA PCR SCREENING  CSF CULTURE  GRAM STAIN  LACTIC ACID, PLASMA  LACTIC ACID, PLASMA  PROTIME-INR  APTT  BLOOD GAS, VENOUS  BLOOD GAS, ARTERIAL  CSF CELL COUNT WITH DIFFERENTIAL  CSF CELL COUNT WITH DIFFERENTIAL  GLUCOSE, CSF  PROTEIN, CSF    EKG None  Radiology CT Head Wo Contrast  Result Date: 10/12/2019 CLINICAL DATA:  Insert follow-up with the. EXAM: CT HEAD WITHOUT CONTRAST TECHNIQUE: Contiguous axial images were obtained from the base of the skull through the vertex without intravenous contrast. COMPARISON:  None. FINDINGS: Brain: No evidence of acute infarction, hemorrhage, hydrocephalus, extra-axial collection or mass lesion/mass effect. Vascular: No hyperdense vessel or unexpected calcification. Skull: Normal. Negative for fracture or focal lesion.  Sinuses/Orbits: No acute finding. Other: None IMPRESSION: No acute intracranial abnormality. Electronically Signed   By: Fidela Salisbury M.D.   On: 10/12/2019 10:47   DG Chest Port 1 View  Result Date: 10/12/2019 CLINICAL DATA:  Weakness and lethargy. History of stomach cancer. EXAM: PORTABLE CHEST 1 VIEW COMPARISON:  September 10, 2019 FINDINGS: Injectable port in stable position. Cardiomediastinal silhouette is normal. Mediastinal contours appear intact. Hyperinflation and emphysematous changes of the lungs. There is no evidence of focal airspace consolidation, pleural effusion or pneumothorax. Osseous structures are without acute abnormality. Soft tissues are grossly normal. IMPRESSION: 1. No active disease in the chest. 2. Emphysematous changes of the lungs. Electronically Signed   By: Fidela Salisbury M.D.   On: 10/12/2019 10:52    Procedures .Lumbar Puncture  Date/Time: 10/12/2019 2:19 PM Performed by: Domenic Moras, PA-C Authorized by: Domenic Moras, PA-C   Consent:    Consent obtained:  Emergent situation   Consent given by:  Spouse   Risks discussed:  Bleeding, infection, pain, repeat procedure, nerve damage and headache   Alternatives discussed:  No treatment Pre-procedure details:    Procedure purpose:  Diagnostic   Preparation: Patient was prepped and draped in usual sterile fashion   Anesthesia (see MAR for exact dosages):    Anesthesia method:  Local infiltration   Local anesthetic:  Lidocaine 1% w/o epi Procedure details:    Lumbar space:  L4-L5 interspace   Patient position:  L lateral decubitus   Needle gauge:  24   Needle type:  Spinal needle - Quincke tip   Needle length (in):  2.5   Ultrasound guidance: no     Number of attempts:  2   Fluid appearance:  Blood-tinged   Tubes of fluid:  4   Total volume (ml):  8 Post-procedure:    Puncture site:  Adhesive bandage applied and direct pressure applied   Patient tolerance of procedure:  Tolerated with  difficulty  .Critical Care Performed by: Domenic Moras, PA-C Authorized by: Domenic Moras, PA-C   Critical care provider statement:    Critical care time (minutes):  35   Critical care was time spent personally by me on the following activities:  Discussions with consultants, evaluation of patient's response to treatment, examination of patient, ordering and performing treatments and interventions, ordering and review of laboratory studies, ordering and review of radiographic studies, pulse oximetry, re-evaluation of patient's condition, obtaining history from patient or surrogate and review of old charts   (including critical care time)  Medications Ordered in ED Medications  albuterol (VENTOLIN HFA) 108 (90 Base) MCG/ACT inhaler 2 puff (2 puffs Inhalation Not Given 10/12/19 1300)  vancomycin (VANCOREADY) IVPB 750 mg/150 mL (has no administration in time range)  ceFEPIme (MAXIPIME) 2 g in sodium chloride 0.9 % 100 mL IVPB (has no administration in time range)  lactated ringers bolus 1,000 mL (0 mLs Intravenous Stopped 10/12/19 1127)  ceFEPIme (MAXIPIME) 2 g in sodium chloride 0.9 % 100 mL IVPB (0 g Intravenous Stopped 10/12/19 1250)  metroNIDAZOLE (FLAGYL) IVPB 500 mg (0 mg Intravenous Stopped 10/12/19 1151)  vancomycin (VANCOCIN) IVPB 1000 mg/200 mL premix (0 mg Intravenous Stopped 10/12/19 1200)  dexamethasone (DECADRON) injection 10 mg (10 mg Intravenous Given 10/12/19 1258)    ED Course  I have reviewed the triage vital signs and the nursing notes.  Pertinent labs & imaging results that were available during my care of the patient were reviewed by me and considered in my medical decision making (see chart for details).    MDM Rules/Calculators/Bobby&P                      BP (!) 144/81   Pulse 96   Temp (!) 97.4 F (36.3 C) (Rectal)   Resp (!) 21   Ht 5\' 5"  (1.651 m)   Wt 45.4 kg   SpO2 94%   BMI 16.64 kg/m   Final Clinical Impression(s) / ED Diagnoses Final diagnoses:  Hypercapnia   COPD exacerbation ([place])  Delirium    Rx / DC Orders ED Discharge Orders    None     10:20 AM Patient with stomach cancer here with altered mental status and initially febrile with Bobby temperature of 101 according to EMS.  He has received Tylenol and on repeat vital sign his current temperature is 98.9.  He is currently on home oxygenation.  He is mildly tachypneic, O2 sats at 100% on 4 L.  Normal blood pressure.  Code sepsis initiated initially due to altered mental status, tachypnea, febrile.  Patient was given broad-spectrum antibiotic.  Due to underlying malignancy, will also obtain head CT scan.  10:23 AM Through chart review, patient was admitted to go for acute respiratory failure with hypoxia and hypercapnia likely secondary to COPD exacerbation.  No evidence of PE or pneumonia during visit.  Patient has history of gastric adenocarcinoma status post partial gastrectomy and neoadjuvant chemotherapy, his oncologist Dr. Alvy Bimler.  11:14 AM Patient has normal lactic acid, normal WBC, and is not hypotensive.  He does not have documented fever in the ED therefore code sepsis canceled.  Will not fluid resuscitated at 30 mL/kg.  Chest x-ray without focal infiltrate concerning for pneumonia.  There are evidence of emphysematous changes in the  lung.  Head CT scan without acute finding.  Mild hyponatremia with sodium of 129.  Patient has Bobby pH of 7.337.  Elevated PCO2 of 90.4, and bicarb is 38.4.  2:20 PM I called pt's family for consent of LP.  Able to obtain CSF after two tries by me.  Blood tinge cerebral spinal fluid were noted.  Will consult for admission. COVID-19 screening test ordered.   2:41 PM Appreciate consultation from [facility] Dr. Tamala Julian who agrees to see and admit pt for further management. At this time pt does not need to be on BiPaP  Rene Lardy was evaluated in Emergency Department on 10/12/2019 for the symptoms described in the history of present illness. He was evaluated  in the context of the global COVID-19 pandemic, which necessitated consideration that the patient might be at risk for infection with the SARS-CoV-2 virus that causes COVID-19. Institutional protocols and algorithms that pertain to the evaluation of patients at risk for COVID-19 are in Bobby state of rapid change based on information released by regulatory bodies including the CDC and federal and state organizations. These policies and algorithms were followed during the patient's care in the ED.    Domenic Moras, PA-C 10/12/19 1442    Pattricia Boss, MD 10/14/19 1236

## 2019-10-12 NOTE — ED Notes (Signed)
RN attempted report 

## 2019-10-12 NOTE — Progress Notes (Signed)
Code Sepsis monitoring discontinued due to cancellation.  

## 2019-10-12 NOTE — ED Triage Notes (Signed)
Per [facility] pt from home, family reports weakness and lethargy x2. sats 87% on 2LNC at home, NRB 100%, 96-97% 4L [place]   Pt c/o abd pain, hx of constipation and stomach Ca.  BP 100/65   1000 mg Tylenol liquid   20g LFA

## 2019-10-12 NOTE — ED Notes (Signed)
SpO2 dropped to 57% on RA, placed on NRB sats gradually increased to 100%, upon returning from CT pt placed on 4L [place], usually requires 2L [place] at home, Sats 100% on 4L [place]

## 2019-10-12 NOTE — ED Notes (Signed)
Pt rectal temp 97.4, warm blankets applied

## 2019-10-12 NOTE — Progress Notes (Signed)
Placed patient on BiPAP per MD's order. 

## 2019-10-12 NOTE — ED Notes (Signed)
SpO2 decreased to 90% on 4L [place], RN increased O2 to 5L via [place], SpO2 91%.

## 2019-10-12 NOTE — Progress Notes (Signed)
Pharmacy Antibiotic Note  Bobby Caldwell is a 64 y.o. male admitted on 10/12/2019 with sepsis.  Pharmacy has been consulted for vancomycin and cefepime dosing.  LA 1.7, WBC wnl, initially febrile, RR 22  Plan: Vancomycin 1000 mg IV x 1, then 750 mg IV every 24 hours Goal AUC 400-550. Expected AUC: 413 SCr used: 0.8 Cefepime 2g IV every 8 hours Monitor renal function, Cx and clinical progression to narrow Vancomycin levels at steady state     No data recorded.  No results for input(s): WBC, CREATININE, LATICACIDVEN, VANCOTROUGH, VANCOPEAK, VANCORANDOM, GENTTROUGH, GENTPEAK, GENTRANDOM, TOBRATROUGH, TOBRAPEAK, TOBRARND, AMIKACINPEAK, AMIKACINTROU, AMIKACIN in the last 168 hours.  CrCl cannot be calculated (Patient's most recent lab result is older than the maximum 21 days allowed.).    No Known Allergies  Antimicrobials this admission: Vanc 4/4>> Cefepime 4/4>>  Dose adjustments this admission: n/a  Microbiology results: 4/4 BCx: sent 4/4 UCx: sent   Bertis Ruddy, PharmD Clinical Pharmacist ED Pharmacist Phone # [phone] 10/12/2019 11:27 AM

## 2019-10-12 NOTE — H&P (Signed)
History and Physical    Bobby Caldwell [id number] DOB: October 15, 1955 DOA: 10/12/2019  Referring MD/NP/PA: Domenic Moras, PA-C PCP: Patient, No Pcp Per  Patient coming from: Home via EMS  Chief Complaint: Weakness and fatigue  I have personally briefly reviewed patient's old medical records in [place]   HPI: Bobby Caldwell is a 64 y.o. [place] male with medical history significant of COPD, gastric cancer s/p partial gastrectomy with neoadjuvant chemotherapy in 11/2017, PAD, tobacco abuse, and history of COVID-19 infection in 06/2019 presents with complaints of weakness and fatigue.  History is obtained with the use of interpreter services by the patient's wife as the patient is currently altered.  Over the last few days the patient had been coming increasingly weak and shaky complaining of increased shortness of breath.  He was noted to be more confused and was not able to talk or break since this morning.  At home he had been on 2 L of nasal cannula oxygen and had been using breathing treatments without any change in symptoms.  Family reported that O2 saturations were as low as 58%.  He is not on any BiPAP or CPAP.  EMS noted patient had a temperature up to 101 F.  His wife does report that the patient still smokes approximately 5 cigarettes/day on average.  Patient was admitted with similar symptoms just 1 month ago where he required admission for which he was placed on BiPAP with improvement in symptoms.  ED Course: Upon admission into the emergency department patient had been seen to be afebrile, pulse 98 and 109, respiration 18-28, and O2 saturations improved from 63% to 100% on 10 L of nasal cannula oxygen.  Initial PCO2 by penis blood gas revealed a PCO2 of 94.  Subsequently a arterial blood gas revealed pH of 7.36 and PCO2 62.8.  Labs revealed WBC 9.7 and lactic acid 1.7.  CT scan of the brain showed no acute abnormalities.  A lumbar puncture was obtained due to patient being significantly  altered.  Chest x-ray did not show any acute signs of a infiltrate.  Patient had been empirically started on empiric antibiotics for concern for healthcare associated pneumonia.  Review of Systems  Unable to perform ROS: Mental status change  Constitutional: Positive for fever.  Respiratory: Positive for shortness of breath.   Neurological: Positive for weakness.    Past Medical History:  Diagnosis Date  . COPD (chronic obstructive pulmonary disease) ([place])   . Dyspnea   . PAD (peripheral artery disease) ([place])   . Stomach cancer ([place])   . Tobacco abuse   . Weight loss     Past Surgical History:  Procedure Laterality Date  . ABDOMINAL AORTOGRAM W/LOWER EXTREMITY N/A 05/03/2017   Procedure: ABDOMINAL AORTOGRAM W/LOWER EXTREMITY;  Surgeon: Conrad Manila, MD;  Location: [facility];  Service: Cardiovascular;  Laterality: N/A;  . BIOPSY  08/22/2018   Procedure: BIOPSY;  Surgeon: [name], MD;  Location: [facility];  Service: Endoscopy;;  . BIOPSY  07/31/2019   Procedure: BIOPSY;  Surgeon: [name], MD;  Location: [facility];  Service: Endoscopy;;  . ESOPHAGOGASTRODUODENOSCOPY     05/24/17  . ESOPHAGOGASTRODUODENOSCOPY (EGD) WITH PROPOFOL N/A 05/24/2017   Procedure: ESOPHAGOGASTRODUODENOSCOPY (EGD) WITH PROPOFOL;  Surgeon: [name], MD;  [facility]: [facility];  Service: Endoscopy;  Laterality: N/A;  . ESOPHAGOGASTRODUODENOSCOPY (EGD) WITH PROPOFOL N/A 08/22/2018   Procedure: ESOPHAGOGASTRODUODENOSCOPY (EGD) WITH PROPOFOL;  Surgeon: [name], MD;  [facility];  Service: Endoscopy;  Laterality: N/A;  . ESOPHAGOGASTRODUODENOSCOPY (EGD) WITH PROPOFOL N/A 11/28/2018   Procedure: ESOPHAGOGASTRODUODENOSCOPY (EGD) WITH PROPOFOL;  Surgeon: [name], MD;  [facility];  Service: Endoscopy;  Laterality: N/A;  . ESOPHAGOGASTRODUODENOSCOPY (EGD) WITH PROPOFOL N/A 07/31/2019   Procedure: ESOPHAGOGASTRODUODENOSCOPY (EGD) WITH PROPOFOL;   Surgeon: [name], MD;  [facility];  Service: Endoscopy;  Laterality: N/A;  . EUS N/A 06/07/2017   Procedure: UPPER ENDOSCOPIC ULTRASOUND (EUS) RADIAL;  Surgeon: [name], MD;  [facility]: [facility];  Service: Endoscopy;  Laterality: N/A;  . FEMORAL-FEMORAL BYPASS GRAFT Bilateral 05/08/2017   Procedure: LEFT TO RIGHT BYPASS GRAFT FEMORAL-FEMORAL ARTERY;  Surgeon: Conrad Esto, MD;  Location: [facility];  Service: Vascular;  Laterality: Bilateral;  . IR FLUORO GUIDE PORT INSERTION RIGHT  07/05/2017  . IR US GUIDE VASC ACCESS RIGHT  07/05/2017  . LAPAROSCOPIC INSERTION GASTROSTOMY TUBE N/A 11/27/2017   Procedure: JEJUNOSTOMY TUBE PLACEMENT;  Surgeon: [name], MD;  Location: [facility];  Service: General;  Laterality: N/A;  . LAPAROSCOPIC PARTIAL GASTRECTOMY N/A 11/27/2017   Procedure: PARTIAL GASTRECTOMY;  Surgeon: [name], MD;  Location: [facility];  Service: General;  Laterality: N/A;  . LAPAROSCOPY N/A 11/27/2017   Procedure: LAPAROSCOPY DIAGNOSTIC;  Surgeon: [name], MD;  Location: [facility];  Service: General;  Laterality: N/A;  . NM MYOVIEW LTD  06/2017   LOW RISK. Small basal inferoseptal defect thought to be related to artifact (although cannot exclude prior infarct). Normal wall motion i in this area would suggest artifact not infarct. Otherwise no ischemia or infarction.  Marland Kitchen PERIPHERAL VASCULAR INTERVENTION Left 05/03/2017   Procedure: PERIPHERAL VASCULAR INTERVENTION;  Surgeon: Conrad Highlands, MD;  Location: [facility];  Service: Cardiovascular;  Laterality: Left;  common iliac  . POLYPECTOMY  11/28/2018   Procedure: POLYPECTOMY;  Surgeon: [name], MD;  [facility];  Service: Endoscopy;;  . TRANSTHORACIC ECHOCARDIOGRAM  06/26/2017   Normal LV size and function. EF 60-65%. No wall motion abnormalities. GR 1 DD. Mild LA dilation     reports that he has been smoking cigarettes. He has a 25.00 pack-year smoking history. He has never used  smokeless tobacco. He reports that he does not drink alcohol or use drugs.  No Known Allergies  Family History  Family history unknown: Yes    Prior to Admission medications   Medication Sig Start Date End Date Taking? Authorizing Provider  Amino Acids-Protein Hydrolys (FEEDING SUPPLEMENT, PRO-STAT SUGAR FREE 64,) LIQD Take 30 mLs by mouth 2 (two) times daily. 09/12/19  Yes Mariel Aloe, MD  budesonide-formoterol [facility]) 160-4.5 MCG/ACT inhaler Inhale 2 puffs into the lungs 2 (two) times daily. 10/09/19  Yes Olalere, Adewale A, MD  feeding supplement, ENSURE ENLIVE, (ENSURE ENLIVE) LIQD Take 237 mLs by mouth 2 (two) times daily between meals. 09/13/19  Yes Mariel Aloe, MD  ipratropium-albuterol (DUONEB) 0.5-2.5 (3) MG/3ML SOLN Inhale 3 mLs into the lungs 4 (four) times daily. Dx:J44.9 10/09/19  Yes Olalere, Adewale A, MD  megestrol (MEGACE) 40 MG/ML suspension Take 200 mg by mouth 4 (four) times daily. 10/06/19  Yes [provider]  mirtazapine (REMERON) 15 MG tablet Take 1 tablet (15 mg total) by mouth at bedtime. 08/22/19  Yes [name], MD  Multiple Vitamin (MULTIVITAMIN WITH MINERALS) TABS tablet Take 1 tablet by mouth daily. 09/13/19  Yes Mariel Aloe, MD  OXYGEN Inhale 2 L into the lungs continuous.   Yes [provider]  Tiotropium Bromide Monohydrate (SPIRIVA RESPIMAT) 2.5 MCG/ACT AERS Inhale 2 puffs into the lungs daily. 10/09/19  Yes Laurin Coder, MD    Physical Exam:  Constitutional: Thin elderly appearing male who Vitals:   10/12/19 1400 10/12/19 1415 10/12/19 1430 10/12/19 1445  BP: 138/85 (!) 154/91 (!) 144/81 138/88  Pulse: 96 (!) 101 96 94  Resp: 20 20 (!) 21 18  Temp:      TempSrc:      SpO2: 99% 96% 94% 91%  Weight:      Height:       Eyes: PERRL, lids and conjunctivae normal ENMT: Mucous membranes are dry. Posterior pharynx clear of any exudate or lesions.  Neck: normal, supple, no masses, no thyromegaly Respiratory:  Cardiovascular:  Regular rate and rhythm, no murmurs / rubs / gallops. No extremity edema. 2+ pedal pulses. No carotid bruits.  Abdomen: no tenderness, no masses palpated. No hepatosplenomegaly. Bowel sounds positive.  Musculoskeletal: no clubbing / cyanosis. No joint deformity upper and lower extremities. Good ROM, no contractures. Normal muscle tone.  Skin: no rashes, lesions, ulcers. No induration Neurologic: CN 2-12 grossly intact. Sensation intact, DTR normal. Strength 5/5 in all 4.  Psychiatric: Normal judgment and insight. Alert and oriented x 3. Normal mood.     Labs on Admission: I have personally reviewed following labs and imaging studies  CBC: Recent Labs  Lab 10/12/19 1007 10/12/19 1020 10/12/19 1217  WBC 9.7  --   --   NEUTROABS 7.9*  --   --   HGB 15.9 17.7* 13.6  HCT 48.8 52.0 40.0  MCV 85.6  --   --   PLT 303  --   --    Basic Metabolic Panel: Recent Labs  Lab 10/12/19 1007 10/12/19 1020 10/12/19 1217  NA 129* 127* 122*  K 4.4 4.3 3.8  CL 83*  --   --   CO2 34*  --   --   GLUCOSE 138*  --   --   BUN 10  --   --   CREATININE 0.65  --   --   CALCIUM 8.9  --   --    GFR: Estimated Creatinine Clearance: 61.5 mL/min (by C-G formula based on SCr of 0.65 mg/dL). Liver Function Tests: Recent Labs  Lab 10/12/19 1007  AST 21  ALT 18  ALKPHOS 82  BILITOT 0.8  PROT 6.4*  ALBUMIN 3.5   No results for input(s): LIPASE, AMYLASE in the last 168 hours. No results for input(s): AMMONIA in the last 168 hours. Coagulation Profile: Recent Labs  Lab 10/12/19 1310  INR 1.1   Cardiac Enzymes: No results for input(s): CKTOTAL, CKMB, CKMBINDEX, TROPONINI in the last 168 hours. BNP (last 3 results) No results for input(s): PROBNP in the last 8760 hours. HbA1C: No results for input(s): HGBA1C in the last 72 hours. CBG: No results for input(s): GLUCAP in the last 168 hours. Lipid Profile: No results for input(s): CHOL, HDL, LDLCALC, TRIG, CHOLHDL, LDLDIRECT in the last 72  hours. Thyroid Function Tests: No results for input(s): TSH, T4TOTAL, FREET4, T3FREE, THYROIDAB in the last 72 hours. Anemia Panel: No results for input(s): VITAMINB12, FOLATE, FERRITIN, TIBC, IRON, RETICCTPCT in the last 72 hours. Urine analysis:    Component Value Date/Time   COLORURINE YELLOW 10/12/2019 1024   APPEARANCEUR CLEAR 10/12/2019 1024   LABSPEC 1.014 10/12/2019 1024   PHURINE 6.0 10/12/2019 1024   GLUCOSEU NEGATIVE 10/12/2019 1024   HGBUR NEGATIVE 10/12/2019 1024  BILIRUBINUR NEGATIVE 10/12/2019 1024   KETONESUR 20 (A) 10/12/2019 1024   PROTEINUR NEGATIVE 10/12/2019 1024   NITRITE NEGATIVE 10/12/2019 1024   LEUKOCYTESUR NEGATIVE 10/12/2019 1024   Sepsis Labs: No results found for this or any previous visit (from the past 240 hour(s)).   Radiological Exams on Admission: CT Head Wo Contrast  Result Date: 10/12/2019 CLINICAL DATA:  Insert follow-up with the. EXAM: CT HEAD WITHOUT CONTRAST TECHNIQUE: Contiguous axial images were obtained from the base of the skull through the vertex without intravenous contrast. COMPARISON:  None. FINDINGS: Brain: No evidence of acute infarction, hemorrhage, hydrocephalus, extra-axial collection or mass lesion/mass effect. Vascular: No hyperdense vessel or unexpected calcification. Skull: Normal. Negative for fracture or focal lesion. Sinuses/Orbits: No acute finding. Other: None IMPRESSION: No acute intracranial abnormality. Electronically Signed   By: Fidela Salisbury M.D.   On: 10/12/2019 10:47   DG Chest Port 1 View  Result Date: 10/12/2019 CLINICAL DATA:  Weakness and lethargy. History of stomach cancer. EXAM: PORTABLE CHEST 1 VIEW COMPARISON:  September 10, 2019 FINDINGS: Injectable port in stable position. Cardiomediastinal silhouette is normal. Mediastinal contours appear intact. Hyperinflation and emphysematous changes of the lungs. There is no evidence of focal airspace consolidation, pleural effusion or pneumothorax. Osseous structures  are without acute abnormality. Soft tissues are grossly normal. IMPRESSION: 1. No active disease in the chest. 2. Emphysematous changes of the lungs. Electronically Signed   By: Fidela Salisbury M.D.   On: 10/12/2019 10:52    Chest x-ray: Independently reviewed.  Hyperinflation without acute infiltrate  Assessment/Plan Respiratory failure with hypercapnia and hypoxia, COPD exacerbation: Acute on chronic.  Patient presents with reports of shortness of breath and inability to speak.  ABG significant for pH 7.36, PCO2 62.8, and PO2 32.  At baseline patient on 2 L nasal cannula oxygen at home.  Similar presentation just beginning of this month requiring BiPAP. -Admit to a progressive bed -Continuous pulse oximetry  -BiPAP as needed -DuoNebs 4 times daily -Solu-Medrol IV  Fever: Fever up to 101 F.  Unclear cause of symptoms.  WBC within normal limits with slight signs of a left shift.  Questioned possibility healthcare associated pneumonia.  Chest x-ray appears to show no active signs of disease.   -Follow-up CSF culture -Follow-up blood cultures -Continue empiric antibiotics vancomycin and cefepime de-escalate when medically appropriate  History of gastric cancer: Patient status post partial gastrectomy with neoadjuvant chemotherapy in 2019.  Last underwent EGD by Dr. [name] in 11/2018 which revealed a adenomatous polyp that was removed.  Hyponatremia: Acute on chronic.  Sodium 129 on admission. -Continue to monitor  Tobacco use: Wife notes that the patient still smokes approximately 5 cigarettes/day on average. -Nicotine patch provided  History of COVID-19 infection: Patient is at least 3 months out from infection.  DVT prophylaxis: Lovenox Code Status: Full Family Communication: Wife updated at bedside Disposition Plan: To be determined Consults called: None Admission status: *MD  Norval Morton MD [facility] Pager [phone]   If 7PM-7AM, please contact  night-coverage [email or web address] Password [email or web address]  10/12/2019, 3:01 PM

## 2019-10-13 DIAGNOSIS — G9341 Metabolic encephalopathy: Secondary | ICD-10-CM

## 2019-10-13 DIAGNOSIS — R5381 Other malaise: Secondary | ICD-10-CM

## 2019-10-13 DIAGNOSIS — E43 Unspecified severe protein-calorie malnutrition: Secondary | ICD-10-CM

## 2019-10-13 LAB — CSF CELL COUNT WITH DIFFERENTIAL
RBC Count, CSF: 16000 /mm3 — ABNORMAL HIGH
RBC Count, CSF: 3150 /mm3 — ABNORMAL HIGH
Tube #: 1
Tube #: 4
WBC, CSF: 0 /mm3 (ref 0–5)
WBC, CSF: 8 /mm3 — ABNORMAL HIGH (ref 0–5)

## 2019-10-13 LAB — CBC
HCT: 44.1 % (ref 39.0–52.0)
Hemoglobin: 14.3 g/dL (ref 13.0–17.0)
MCH: 27.8 pg (ref 26.0–34.0)
MCHC: 32.4 g/dL (ref 30.0–36.0)
MCV: 85.8 fL (ref 80.0–100.0)
Platelets: 249 10*3/uL (ref 150–400)
RBC: 5.14 MIL/uL (ref 4.22–5.81)
RDW: 13.1 % (ref 11.5–15.5)
WBC: 1.8 10*3/uL — ABNORMAL LOW (ref 4.0–10.5)
nRBC: 0 % (ref 0.0–0.2)

## 2019-10-13 LAB — URINE CULTURE: Culture: NO GROWTH

## 2019-10-13 LAB — BASIC METABOLIC PANEL
Anion gap: 11 (ref 5–15)
BUN: 8 mg/dL (ref 8–23)
CO2: 34 mmol/L — ABNORMAL HIGH (ref 22–32)
Calcium: 8.5 mg/dL — ABNORMAL LOW (ref 8.9–10.3)
Chloride: 88 mmol/L — ABNORMAL LOW (ref 98–111)
Creatinine, Ser: 0.58 mg/dL — ABNORMAL LOW (ref 0.61–1.24)
GFR calc Af Amer: >60 mL/min (ref >60)
GFR calc non Af Amer: >60 mL/min (ref >60)
Glucose, Bld: 103 mg/dL — ABNORMAL HIGH (ref 70–99)
Potassium: 4.7 mmol/L (ref 3.5–5.1)
Sodium: 133 mmol/L — ABNORMAL LOW (ref 135–145)

## 2019-10-13 LAB — TSH: TSH: 0.101 u[IU]/mL — ABNORMAL LOW (ref 0.350–4.500)

## 2019-10-13 LAB — T4, FREE: Free T4: 1.01 ng/dL (ref 0.61–1.12)

## 2019-10-13 MED ORDER — MEGESTROL ACETATE 400 MG/10ML PO SUSP
200.0000 mg | Freq: Four times a day (QID) | ORAL | Status: DC
  Administered 2019-10-13 – 2019-10-15 (×9): 200 mg via ORAL
  Filled 2019-10-13 (×12): qty 10

## 2019-10-13 MED ORDER — DILTIAZEM HCL 60 MG PO TABS
30.0000 mg | ORAL_TABLET | Freq: Three times a day (TID) | ORAL | Status: DC | PRN
  Filled 2019-10-13: qty 1

## 2019-10-13 MED ORDER — CHLORHEXIDINE GLUCONATE CLOTH 2 % EX PADS
6.0000 | MEDICATED_PAD | Freq: Every day | CUTANEOUS | Status: DC
  Administered 2019-10-13 – 2019-10-15 (×3): 6 via TOPICAL

## 2019-10-13 MED ORDER — SODIUM CHLORIDE 0.9 % IV SOLN: INTRAVENOUS | Status: DC

## 2019-10-13 NOTE — Progress Notes (Signed)
PROGRESS NOTE  Bobby Caldwell [id number] DOB: 08-25-1955   PCP: Patient, No Pcp Per  Patient is from: Home.  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  Reportedly desaturated to 58% at home.  In ED, slightly tachycardic.  Tachypneic to 28. Na 129.  Bicarb 34.  WBC 9.7.  LA 1.7.  VBG 7.23/90.4/39/38.  CXR and CT head without acute finding.  Patient was started on BiPAP.  Repeat ABG 7.36/62.8/32.0/35.5.  He underwent lumbar puncture.  CSF fluid with slightly elevated protein.  Patient was started on broad-spectrum antibiotics, and admitted for fever and acute on chronic respiratory failure with hypoxia and hypercarbia due to COPD exacerbation.   Subjective: Seen and examined earlier this morning.  Spent the night on BiPAP.  No major events overnight or this morning.  No complaints other than shortness of breath, mainly with exertion.  He denies pain.  He says he feels hungry and likes to eat.  Denies nausea, vomiting, abdominal pain or UTI symptoms.  Denies headache or neck stiffness.  Patient speaks [place], and had to use his daughter as an interpreter over the phone.  He also speaks fair [place].  Objective: Vitals:   10/12/19 2313 10/13/19 0347 10/13/19 0723 10/13/19 0900  BP: 114/65 101/67 91/65   Pulse: 97 98 (!) 105 (!) 106  Resp: 20  14 18   Temp:  98.3 F (36.8 C) 97.9 F (36.6 C)   TempSrc:  Axillary Axillary   SpO2: 97% 98% 98% 97%  Weight:      Height:        Intake/Output Summary (Last 24 hours) at 10/13/2019 1113 Last data filed at 10/13/2019 0400 Gross per 24 hour  Intake  1300.06 ml  Output 750 ml  Net 550.06 ml   Filed Weights   10/12/19 1012 10/12/19 1747  Weight: 45.4 kg 45.6 kg    Examination:  GENERAL: Frail and chronically ill-appearing.  No distress. HEENT: MMM.  Vision and hearing grossly intact.  NECK: Supple.  No apparent JVD.  RESP: >90% on RA after I took him off BiPAP.  No IWOB.  Diminished air movement bilaterally. CVS:  RRR. Heart sounds normal.  ABD/GI/GU: BS present. Soft. Non tender.  MSK/EXT:  Moves extremities.  Significant muscle mass wasting. SKIN: no apparent skin lesion or wound NEURO: Awake, alert and oriented self, place and situation.  No apparent focal neuro deficit. PSYCH: Calm. Normal affect.   Procedures:  4/4-lumbar puncture  Microbiology summarized: 4/4-COVID-19 negative. 4/4-MRSA PCR negative. 4/4-urine culture and CSF culture NGTD. 4/4-blood culture pending.  Assessment & Plan: Acute on chronic RF with hypercarbia and hypoxia due to COPD exacerbation-patient is on 2 L at baseline. -Continue intermittent BiPAP as needed -Minimal oxygen to maintain saturation above 90%. -Continue IV Solu-Medrol. -Continue nebulizers-Pulmicort, Brovana and DuoNeb -On broad-spectrum antibiotics for febrile illness. -Start regular diet.   Febrile illness: Febrile to 101 on arrival. Portable CXR without significant finding.  Infectious work-up unrevealing so far.  Blood culture pending. -Continue broad-spectrum antibiotics pending blood culture, and de-escalate as appropriate  Acute metabolic encephalopathy: Likely due to hypercarbia in the setting of respiratory failure.  PCO2 was 90.4 on  arrival.  CT head without acute finding.  No focal neuro deficit.  Improved.  He is oriented to self, place and situation now. -Treat respiratory failure as above -Frequent orientation and delirium precautions.  History of gastric cancer: s/p partial gastrectomy with neoadjuvant chemo in 2019. EGD by Dr. [name] in 11/2018 which revealed a  adenomatous polyp that was removed. -Outpatient follow-up.  Hyponatremia: Acute on chronic.  Na 140 on 2/9> 134 on 3/4> 129 (admit)> 122> 133.  SIADH? -Check urine chemistry, a.m. cortisol and TSH  Tobacco use disorder: Smokes about 5 cigarettes a day on average. -Encourage cessation -Nicotine patch  History of COVID-19 infection: In 06/2019.  Language barrier affecting communication and care: Patient speaks [place] and some [place].  His language is not available on the interpreter iPad. -Daughter assisted with encounter over the phone.  Debility/physical deconditioning -PT/OT eval  Severe malnutrition: Body mass index is 16.73 kg/m.  Significant muscle mass and subcu fat loss on exam. -Consult dietitian -Continue Remeron and multivitamin.                  DVT prophylaxis: Subcu Lovenox Code Status: Full code Family Communication: Updated patient's daughter over the phone.  Discharge barrier: Acute respiratory failure requiring BiPAP/COPD exacerbation on IV steroid Patient is from: Home Final disposition: To be determined after therapy evaluation  Consultants:  None   Sch Meds:  Scheduled Meds: . arformoterol  15 mcg Nebulization BID  . budesonide (PULMICORT) nebulizer solution  0.5 mg Nebulization BID  . Chlorhexidine Gluconate Cloth  6 each Topical Daily  . enoxaparin (LOVENOX) injection  40 mg Subcutaneous Q24H  . ipratropium-albuterol  3 mL Inhalation QID  . megestrol  200 mg Oral QID  . methylPREDNISolone (SOLU-MEDROL) injection  60 mg Intravenous Q8H  . mirtazapine  15 mg Oral QHS  . nicotine  14 mg Transdermal Daily  . sodium chloride flush  3 mL Intravenous Q12H   Continuous Infusions: . ceFEPime (MAXIPIME) IV 2 g (10/13/19 0507)  . vancomycin     PRN Meds:.acetaminophen **OR** acetaminophen, ondansetron **OR** ondansetron (ZOFRAN) IV  Antimicrobials: Anti-infectives (From admission, onward)   Start     Dose/Rate Route Frequency Ordered  Stop   10/13/19 1200  vancomycin (VANCOREADY) IVPB 750 mg/150 mL     750 mg 150 mL/hr over 60 Minutes Intravenous Every 24 hours 10/12/19 1137     10/12/19 2200  ceFEPIme (MAXIPIME) 2 g in sodium chloride 0.9 % 100 mL IVPB     2 g 200 mL/hr over 30 Minutes Intravenous Every 8 hours 10/12/19 1137     10/12/19 1015  ceFEPIme (MAXIPIME) 2 g in sodium chloride 0.9 % 100 mL IVPB     2 g 200 mL/hr over 30 Minutes Intravenous  Once 10/12/19 1001 10/12/19 1250   10/12/19 1015  metroNIDAZOLE (FLAGYL) IVPB 500 mg     500 mg 100 mL/hr over 60 Minutes Intravenous  Once 10/12/19 1001 10/12/19 1151   10/12/19 1015  vancomycin (VANCOCIN) IVPB 1000 mg/200 mL premix     1,000 mg 200 mL/hr over 60 Minutes Intravenous  Once 10/12/19 1001 10/12/19 1200       I have personally reviewed the following labs and images: CBC: Recent Labs  Lab 10/12/19 1007 10/12/19 1020 10/12/19 1217 10/13/19 0235  WBC 9.7  --   --  1.8*  NEUTROABS 7.9*  --   --   --   HGB 15.9 17.7* 13.6 14.3  HCT 48.8 52.0 40.0 44.1  MCV 85.6  --   --  85.8  PLT 303  --   --  249   BMP &GFR Recent Labs  Lab 10/12/19 1007 10/12/19 1020 10/12/19 1217 10/13/19 0235  NA 129* 127* 122* 133*  K 4.4 4.3 3.8 4.7  CL 83*  --   --  88*  CO2 34*  --   --  34*  GLUCOSE 138*  --   --  103*  BUN 10  --   --  8  CREATININE 0.65  --   --  0.58*  CALCIUM 8.9  --   --  8.5*   Estimated Creatinine Clearance: 61.8 mL/min (A) (by C-G formula based on SCr of 0.58 mg/dL (L)). Liver & Pancreas: Recent Labs  Lab 10/12/19 1007  AST 21  ALT 18  ALKPHOS 82  BILITOT 0.8  PROT 6.4*  ALBUMIN 3.5   No results for input(s): LIPASE, AMYLASE in the last 168 hours. No results for input(s): AMMONIA in the last 168 hours. Diabetic: No results for input(s): HGBA1C in the last 72 hours. No results for input(s): GLUCAP in the last 168 hours. Cardiac Enzymes: No results for input(s): CKTOTAL, CKMB, CKMBINDEX, TROPONINI in the last 168 hours. No  results for input(s): PROBNP in the last 8760 hours. Coagulation Profile: Recent Labs  Lab 10/12/19 1310  INR 1.1   Thyroid Function Tests: No results for input(s): TSH, T4TOTAL, FREET4, T3FREE, THYROIDAB in the last 72 hours. Lipid Profile: No results for input(s): CHOL, HDL, LDLCALC, TRIG, CHOLHDL, LDLDIRECT in the last 72 hours. Anemia Panel: No results for input(s): VITAMINB12, FOLATE, FERRITIN, TIBC, IRON, RETICCTPCT in the last 72 hours. Urine analysis:    Component Value Date/Time   COLORURINE YELLOW 10/12/2019 1024   APPEARANCEUR CLEAR 10/12/2019 1024   LABSPEC 1.014 10/12/2019 1024   PHURINE 6.0 10/12/2019 1024   GLUCOSEU NEGATIVE 10/12/2019 1024   HGBUR NEGATIVE 10/12/2019 1024   BILIRUBINUR NEGATIVE 10/12/2019 1024   KETONESUR 20 (A) 10/12/2019 1024   PROTEINUR NEGATIVE 10/12/2019 1024   NITRITE NEGATIVE 10/12/2019 1024   LEUKOCYTESUR NEGATIVE 10/12/2019 1024   Sepsis Labs: Invalid input(s): PROCALCITONIN, [place]  Microbiology: Recent Results (from the past 240 hour(s))  Urine culture     Status: None   Collection Time: 10/12/19 10:24 AM   Specimen: In/Out Cath Urine  Result Value Ref Range Status   Specimen Description IN/OUT CATH URINE  Final   Special Requests NONE  Final   Culture   Final    NO GROWTH Performed at [facility], [address], [place], [place] [address]    Report Status 10/13/2019 FINAL  Final  SARS CORONAVIRUS 2 (TAT 6-24 HRS) Nasopharyngeal Nasopharyngeal Swab     Status: None   Collection Time: 10/12/19 10:55 AM   Specimen: Nasopharyngeal Swab  Result Value Ref Range Status   SARS Coronavirus 2 NEGATIVE NEGATIVE Final    Comment: (NOTE) SARS-CoV-2 target nucleic acids are NOT DETECTED. The SARS-CoV-2 RNA is generally detectable in upper and lower respiratory specimens during the acute phase of infection. Negative results do not preclude SARS-CoV-2 infection, do not rule out co-infections with other pathogens, and  should not be used as the sole basis for treatment or other patient management decisions. Negative results must be combined with clinical observations, patient history, and epidemiological information. The expected result is Negative. Fact Sheet for Patients: SugarRoll.be Fact Sheet for Healthcare Providers: https://www.woods-mathews.com/ This test is not yet approved or cleared by the [place]  FDA and  has been authorized for detection and/or diagnosis of SARS-CoV-2 by FDA under an Emergency Use Authorization (EUA). This EUA will remain  in effect (meaning this test can be used) for the duration of the COVID-19 declaration under Section 56 4(b)(1) of the Act, 21 U.S.C. section 360bbb-3(b)(1), unless the authorization is terminated or revoked sooner. Performed at [facility], [place] [address] [place], [place] [address]   CSF culture     Status: None (Preliminary result)   Collection Time: 10/12/19  1:24 PM   Specimen: CSF; Cerebrospinal Fluid  Result Value Ref Range Status   Specimen Description CSF  Final   Special Requests NONE  Final   Gram Stain NO WBC SEEN NO ORGANISMS SEEN   Final   Culture   Final    NO GROWTH < 24 HOURS Performed at [facility], [place] [address], [place], [place] [address]    Report Status PENDING  Incomplete  MRSA PCR Screening     Status: None   Collection Time: 10/12/19  2:23 PM   Specimen: Nasal Mucosa; Nasopharyngeal  Result Value Ref Range Status   MRSA by PCR NEGATIVE NEGATIVE Final    Comment:        The GeneXpert MRSA Assay (FDA approved for NASAL specimens only), is one component of a comprehensive MRSA colonization surveillance program. It is not intended to diagnose MRSA infection nor to guide or monitor treatment for MRSA infections. Performed at [facility], [place] [address], [place], [place] [address]     Radiology Studies: No results found.  45 minutes with more  than 50% spent in reviewing records, counseling patient/family and coordinating care.  Rylynne Schicker T. [place]  If 7PM-7AM, please contact night-coverage [email or web address] Password [email or web address] 10/13/2019, 11:13 AM

## 2019-10-13 NOTE — Progress Notes (Signed)
Patient arrived from ED to 4E room 9.  Per MD order pt was placed on bipap as soon as he arrived.  Pt noted to be very lethargic and sleepy on arrival.  Vital signs stable.  Telemetry monitor applied and CCMD notified.  CHG bath completed. Pt's wife oriented to unit and room to include call light and phone. This RN participated in facetime call with pt's daughter and all questions were answered to her satisfaction.  Will continue to monitor.

## 2019-10-13 NOTE — Progress Notes (Signed)
PT Cancellation Note  Patient Details Name: Bobby Caldwell MRN: [id number] DOB: 1956/02/17   Cancelled Treatment:    Reason Eval/Treat Not Completed: Patient declined, no reason specified. Pt declining PT evaluation at this time, reporting he has visitors that just arrived from [place] and would like for therapy to return at a different time. PT will follow up as time allows.   Zenaida Niece 10/13/2019, 4:56 PM

## 2019-10-13 NOTE — Progress Notes (Signed)
Taken off BIPAP by MD. Stated to place patient on nasal cannula and keep sats above 90%. Patient tolerating at this time.

## 2019-10-14 DIAGNOSIS — R000 Tachycardia, unspecified: Secondary | ICD-10-CM

## 2019-10-14 LAB — RENAL FUNCTION PANEL
Albumin: 2.8 g/dL — ABNORMAL LOW (ref 3.5–5.0)
Anion gap: 8 (ref 5–15)
BUN: 11 mg/dL (ref 8–23)
CO2: 35 mmol/L — ABNORMAL HIGH (ref 22–32)
Calcium: 8.1 mg/dL — ABNORMAL LOW (ref 8.9–10.3)
Chloride: 95 mmol/L — ABNORMAL LOW (ref 98–111)
Creatinine, Ser: 0.43 mg/dL — ABNORMAL LOW (ref 0.61–1.24)
GFR calc Af Amer: >60 mL/min (ref >60)
GFR calc non Af Amer: >60 mL/min (ref >60)
Glucose, Bld: 145 mg/dL — ABNORMAL HIGH (ref 70–99)
Phosphorus: 2.6 mg/dL (ref 2.5–4.6)
Potassium: 4.3 mmol/L (ref 3.5–5.1)
Sodium: 138 mmol/L (ref 135–145)

## 2019-10-14 LAB — CBC
HCT: 39.5 % (ref 39.0–52.0)
Hemoglobin: 12.5 g/dL — ABNORMAL LOW (ref 13.0–17.0)
MCH: 27.3 pg (ref 26.0–34.0)
MCHC: 31.6 g/dL (ref 30.0–36.0)
MCV: 86.2 fL (ref 80.0–100.0)
Platelets: 260 10*3/uL (ref 150–400)
RBC: 4.58 MIL/uL (ref 4.22–5.81)
RDW: 13.5 % (ref 11.5–15.5)
WBC: 6.5 10*3/uL (ref 4.0–10.5)
nRBC: 0 % (ref 0.0–0.2)

## 2019-10-14 LAB — MAGNESIUM: Magnesium: 1.8 mg/dL (ref 1.7–2.4)

## 2019-10-14 LAB — OSMOLALITY, URINE: Osmolality, Ur: 546 mOsm/kg (ref 300–900)

## 2019-10-14 LAB — CORTISOL-AM, BLOOD: Cortisol - AM: 5 ug/dL — ABNORMAL LOW (ref 6.7–22.6)

## 2019-10-14 MED ORDER — METHYLPREDNISOLONE SODIUM SUCC 125 MG IJ SOLR
60.0000 mg | Freq: Two times a day (BID) | INTRAMUSCULAR | Status: DC
  Administered 2019-10-14 – 2019-10-15 (×2): 60 mg via INTRAVENOUS
  Filled 2019-10-14 (×2): qty 2

## 2019-10-14 MED ORDER — IPRATROPIUM BROMIDE 0.02 % IN SOLN: 0.5000 mg | Freq: Four times a day (QID) | RESPIRATORY_TRACT | Status: DC | PRN

## 2019-10-14 MED ORDER — DILTIAZEM HCL 60 MG PO TABS
30.0000 mg | ORAL_TABLET | Freq: Three times a day (TID) | ORAL | Status: DC
  Administered 2019-10-14 – 2019-10-15 (×4): 30 mg via ORAL
  Filled 2019-10-14 (×5): qty 1

## 2019-10-14 MED ORDER — ADULT MULTIVITAMIN W/MINERALS CH
1.0000 | ORAL_TABLET | Freq: Every day | ORAL | Status: DC
  Administered 2019-10-14 – 2019-10-15 (×2): 1 via ORAL
  Filled 2019-10-14 (×3): qty 1

## 2019-10-14 MED ORDER — DOXYCYCLINE HYCLATE 100 MG PO TABS
100.0000 mg | ORAL_TABLET | Freq: Two times a day (BID) | ORAL | Status: DC
  Administered 2019-10-14 – 2019-10-15 (×3): 100 mg via ORAL
  Filled 2019-10-14 (×3): qty 1

## 2019-10-14 MED ORDER — LEVALBUTEROL HCL 0.63 MG/3ML IN NEBU: 0.6300 mg | INHALATION_SOLUTION | Freq: Four times a day (QID) | RESPIRATORY_TRACT | Status: DC | PRN

## 2019-10-14 MED ORDER — ENSURE ENLIVE PO LIQD
237.0000 mL | Freq: Two times a day (BID) | ORAL | Status: DC
  Administered 2019-10-14: 237 mL via ORAL

## 2019-10-14 NOTE — Evaluation (Addendum)
Occupational Therapy Evaluation Patient Details Name: Bobby Caldwell MRN: [id number] DOB: 01-22-1956 Today's Date: 10/14/2019    History of Present Illness Pt is a 64 yo albanian-speaking male presenting with AMS, and progressive SOB and weakness. PMHx includes: COPD, gastric cancer s/p partial gastrectomy with neoadjuvant chemotherapy in 11/2017, PAD, tobacco abuse, and history of COVID-19 infection in 06/2019.    Clinical Impression   This 63 y/o male presents with the above. PTA pt reports independence with functional mobility, receives some assist for bathing ADL, living with spouse/daughter. Pt currently presenting with impaired cognition, weakness, decreased standing balance. Pt overall requiring minA for functional mobility using RW, minA for standing grooming and LB ADL. Pt with poor safety awareness and requiring cues throughout, with intermittent LOB and requiring minA to correct. Pt mostly non-English speaking (preferred language not available on [employer]), but able to understand basic questions/commands given repetition and gestural cues. Pt will benefit from continued acute OT services, recommend follow up [facility] services as well as hands on 24hr assist after return home given mild imbalances and current cognitive level. If pt's family not able to provide necessary assist may need to consider SNF. Will follow.     Follow Up Recommendations  Home health OT;Supervision/Assistance - 24 hour(will need hands on 24hr assist)    Equipment Recommendations  Tub/shower seat           Precautions / Restrictions Precautions Precautions: Fall Precaution Comments: chronic O2 Restrictions Weight Bearing Restrictions: No      Mobility Bed Mobility Overal bed mobility: Needs Assistance Bed Mobility: Supine to Sit     Supine to sit: Min guard     General bed mobility comments: increased cues for pt to perform correctly/fully   Transfers Overall transfer level: Needs assistance Equipment  used: Rolling walker (2 wheeled) Transfers: Sit to/from Stand Sit to Stand: Min guard         General transfer comment: for lines and safety. Pt initially stood from EOB prior to therapist being ready. Practiced additional sit<>stands from recliner at minguard level    Balance Overall balance assessment: Needs assistance Sitting-balance support: Feet supported Sitting balance-Leahy Scale: Good     Standing balance support: Bilateral upper extremity supported Standing balance-Leahy Scale: Poor Standing balance comment: reliant on UE support, using bil forearms for support during standing grooming ADL                           ADL either performed or assessed with clinical judgement   ADL Overall ADL's : Needs assistance/impaired Eating/Feeding: Modified independent;Sitting Eating/Feeding Details (indicate cue type and reason): setup with breakfast tray end of session Grooming: Wash/dry hands;Minimal assistance;Standing Grooming Details (indicate cue type and reason): pt using bil forearms to support himself while standing at sink Upper Body Bathing: Min guard;Set up;Sitting   Lower Body Bathing: Minimal assistance;Sit to/from stand   Upper Body Dressing : Set up;Min guard;Sitting   Lower Body Dressing: Minimal assistance;Sit to/from stand   Toilet Transfer: Minimal assistance;Ambulation;+2 for safety/equipment;RW   Toileting- Clothing Manipulation and Hygiene: Minimal assistance;Sit to/from stand       Functional mobility during ADLs: Minimal assistance;+2 for safety/equipment;Rolling walker                           Pertinent Vitals/Pain Pain Assessment: No/denies pain     Hand Dominance Right   Extremity/Trunk Assessment Upper Extremity Assessment Upper Extremity Assessment: Generalized weakness  Lower Extremity Assessment Lower Extremity Assessment: Defer to PT evaluation       Communication Communication Communication: Prefers  language other than English([place], knows some [place])   Cognition Arousal/Alertness: Awake/alert Behavior During Therapy: WFL for tasks assessed/performed Overall Cognitive Status: No family/caregiver present to determine baseline cognitive functioning                                 General Comments: pt with poor safety awareness and requiring cues throughout session. Pt requiring repetition of instructions and questions throughout session, suspect partly due to language barrier, often requiring short concise commands and gestural cues   General Comments  Using 3L O2 with activity (progressed to 2L) SpO2 maintaining 94-96%, pt initiated removing O2 during grooming ADL with SpO2 decreasing to 85% on RA, reapplied at 2L with O2 sats returning to >/=91% for remainder of session. HR 120-126 with activity     Exercises     Shoulder Instructions      Home Living Family/patient expects to be discharged to:: Private residence Living Arrangements: Spouse/significant other;Children Available Help at Discharge: Family Type of Home: House Home Access: Stairs to enter Technical brewer of Steps: 4 Entrance Stairs-Rails: Right Home Layout: One level     Bathroom Shower/Tub: Tub/shower unit         Home Equipment: Shower seat(?unsure of all home equipment)   Additional Comments: some home setup obtained from recent admit in March 2021 as pt difficult historian       Prior Functioning/Environment Level of Independence: Needs assistance  Gait / Transfers Assistance Needed: reports ambulatory without AD ADL's / Homemaking Assistance Needed: reports he receives assist for bathing ADL (?difficult to clarify whether pt sponge bathing or bathing seated in shower)            OT Problem List: Decreased strength;Decreased activity tolerance;Decreased range of motion;Impaired balance (sitting and/or standing);Decreased cognition;Decreased safety awareness;Decreased  knowledge of use of DME or AE;Cardiopulmonary status limiting activity      OT Treatment/Interventions: Self-care/ADL training;Therapeutic exercise;Energy conservation;DME and/or AE instruction;Therapeutic activities;Cognitive remediation/compensation;Patient/family education;Balance training    OT Goals(Current goals can be found in the care plan section) Acute Rehab OT Goals Patient Stated Goal: none stated, agreeable to working with therapy OT Goal Formulation: With patient Time For Goal Achievement: 10/28/19 Potential to Achieve Goals: Good  OT Frequency: Min 2X/week   Barriers to D/C:            Co-evaluation PT/OT/SLP Co-Evaluation/Treatment: Yes Reason for Co-Treatment: For patient/therapist safety;Necessary to address cognition/behavior during functional activity   OT goals addressed during session: ADL's and self-care      AM-PAC OT "6 Clicks" Daily Activity     Outcome Measure Help from another person eating meals?: None Help from another person taking care of personal grooming?: A Little Help from another person toileting, which includes using toliet, bedpan, or urinal?: A Little Help from another person bathing (including washing, rinsing, drying)?: A Little Help from another person to put on and taking off regular upper body clothing?: None Help from another person to put on and taking off regular lower body clothing?: A Little 6 Click Score: 20   End of Session Equipment Utilized During Treatment: Gait belt;Rolling walker;Oxygen Nurse Communication: Mobility status  Activity Tolerance: Patient tolerated treatment well Patient left: in chair;with call bell/phone within reach;with chair alarm set  OT Visit Diagnosis: Muscle weakness (generalized) (M62.81);Unsteadiness on feet (R26.81);Other symptoms and signs  involving cognitive function                Time: 0902-0928 OT Time Calculation (min): 26 min Charges:  OT General Charges $OT Visit: 1 Visit OT  Evaluation $OT Eval Moderate Complexity: 1 Mod  Lou Cal, OT Acute Rehabilitation Services Pager [phone] Office [phone]   Raymondo Band 10/14/2019, 11:57 AM

## 2019-10-14 NOTE — Progress Notes (Signed)
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  GOAL:   Patient will meet greater than or equal to 90% of their needs  MONITOR:   PO intake, Supplement acceptance, Weight trends, I & O's, Labs  REASON FOR ASSESSMENT:   Consult Assessment of nutrition requirement/status  ASSESSMENT:   Patient with PMH significant for COPD, gastric cancer s/p partial gastrectomy, s/p chemotherapy 2019, and COVID 19 infection (06/2019). Presents this admission with COPD exacerbation.   RD working remotely.  Unable to obtain nutrition/weight history at this time. RD to provide supplementation to maximize kcal and protain. Meal completions charted as 0-100% for his last 4 meals (44% average).   Weight shows to decline from 50.9 kg on 02/14/2019 to 45.6 kg this admission (10.4% weight loss in 8 months, insignificant for time frame). Suspect severe malnutrition but unable to diagnose without NFPE and history.   Medications: megace QID, solumedrol, remeron Labs: CBG 103-145  Diet Order:   Diet Order            Diet regular Room service appropriate? No; Fluid consistency: Thin  Diet effective now              EDUCATION NEEDS:   Not appropriate for education at this time  Skin:  Skin Assessment: Reviewed RN Assessment  Last BM:  PTA  Height:   Ht Readings from Last 1 Encounters:  10/12/19 5\' 5"  (1.651 m)    Weight:   Wt Readings from Last 1 Encounters:  10/12/19 45.6 kg    BMI:  Body mass index is 16.73 kg/m.  Estimated Nutritional Needs:   Kcal:  1450-1650 kcal  Protein:  75-95 grams  Fluid:  >/= 1.4 L/day   Mariana Single RD,  LDN Clinical Nutrition Pager listed in [place]

## 2019-10-14 NOTE — Evaluation (Signed)
Physical Therapy Evaluation Patient Details Name: Bobby Caldwell MRN: [id number] DOB: [birth year] Today's Date: 10/14/2019   History of Present Illness  Pt is a 64 yo albanian-speaking male presenting with AMS, and progressive SOB and weakness. PMHx includes: COPD, gastric cancer s/p partial gastrectomy with neoadjuvant chemotherapy in 11/2017, PAD, tobacco abuse, and history of COVID-19 infection in 06/2019.   Clinical Impression  Pt in bed upon arrival of PT, agreeable to evaluation at this time. Prior to admission the pt was independently ambulating without AD, living with wife and daughter. The pt now presents with limitations in functional mobility, activity tolerance, endurance, and power due to above dx, and will continue to benefit from skilled PT to address these deficits. The pt was able to demo good functional stability with use of RW during hallway ambulation, and was able to maintain SpO2 94-96% on 2L O2. However, the pt desat to low 80s with removal of [place] to wash his face, but recovered within 1 min of reapplication of 2L O2. The pt will continue to benefit from skilled PT for stair and endurance training prior to d/c home.       Follow Up Recommendations Home health PT;Supervision/Assistance - 24 hour    Equipment Recommendations  Rolling walker with 5" wheels    Recommendations for Other Services       Precautions / Restrictions Precautions Precautions: Fall Precaution Comments: chronic O2 Restrictions Weight Bearing Restrictions: No      Mobility  Bed Mobility Overal bed mobility: Needs Assistance Bed Mobility: Supine to Sit     Supine to sit: Min guard     General bed mobility comments: increased cues for pt to perform correctly/fully   Transfers Overall transfer level: Needs assistance Equipment used: Rolling walker (2 wheeled) Transfers: Sit to/from Stand Sit to Stand: Min guard         General transfer comment: for lines and safety. Pt initially stood  from EOB prior to therapist being ready, no LOB or instability. Practiced additional sit<>stands from recliner at [facility] level. 5xSTS in 23 sec  Ambulation/Gait Ambulation/Gait assistance: Min guard Gait Distance (Feet): 75 Feet Assistive device: Rolling walker (2 wheeled) Gait Pattern/deviations: Step-through pattern;Decreased stride length;Narrow base of support Gait velocity: 0.33 m/s Gait velocity interpretation: <1.31 ft/sec, indicative of household ambulator General Gait Details: Pt with slowed, small steps with use of RW for stability, no LOB but benefits from sig UE support  Stairs            Wheelchair Mobility    Modified Rankin (Stroke Patients Only)       Balance Overall balance assessment: Needs assistance Sitting-balance support: Feet supported Sitting balance-Leahy Scale: Good     Standing balance support: Bilateral upper extremity supported Standing balance-Leahy Scale: Poor Standing balance comment: reliant on UE support, using bil forearms for support during standing grooming ADL                             Pertinent Vitals/Pain Pain Assessment: No/denies pain    Home Living Family/patient expects to be discharged to:: Private residence Living Arrangements: Spouse/significant other;Children Available Help at Discharge: Family Type of Home: House Home Access: Stairs to enter Entrance Stairs-Rails: Right Entrance Stairs-Number of Steps: 4 Home Layout: One level Home Equipment: Shower seat Additional Comments: some home setup obtained from recent admit in March 2021 as pt difficult historian     Prior Function Level of Independence: Needs assistance   Gait /  Transfers Assistance Needed: reports ambulatory without AD  ADL's / Homemaking Assistance Needed: reports he receives assist for bathing ADL (?difficult to clarify whether pt sponge bathing or bathing seated in shower)        Hand Dominance   Dominant Hand: Right     Extremity/Trunk Assessment   Upper Extremity Assessment Upper Extremity Assessment: Generalized weakness    Lower Extremity Assessment Lower Extremity Assessment: Generalized weakness    Cervical / Trunk Assessment Cervical / Trunk Assessment: Kyphotic  Communication   Communication: Prefers language other than English([place]ian, knows some [place])  Cognition Arousal/Alertness: Awake/alert Behavior During Therapy: WFL for tasks assessed/performed Overall Cognitive Status: No family/caregiver present to determine baseline cognitive functioning                                 General Comments: pt with poor safety awareness and requiring cues throughout session. Pt requiring repetition of instructions and questions throughout session, suspect partly due to language barrier, often requiring short concise commands and gestural cues      General Comments General comments (skin integrity, edema, etc.): Using 3L O2 with activity (progressed to 2L) SpO2 maintaining 94-96%, pt initiated removing O2 during grooming ADL with SpO2 decreasing to 85% on RA, reapplied at 2L with O2 sats returning to >/=91% for remainder of session. HR 120-126 with activity    Exercises     Assessment/Plan    PT Assessment Patient needs continued PT services  PT Problem List Decreased strength;Decreased mobility;Decreased range of motion;Decreased coordination;Decreased activity tolerance;Cardiopulmonary status limiting activity;Decreased balance;Decreased cognition;Decreased knowledge of use of DME       PT Treatment Interventions DME instruction;Therapeutic exercise;Balance training;Gait training;Stair training;Functional mobility training;Therapeutic activities;Patient/family education;Cognitive remediation    PT Goals (Current goals can be found in the Care Plan section)  Acute Rehab PT Goals Patient Stated Goal: none stated, agreeable to working with therapy PT Goal Formulation: With  patient Time For Goal Achievement: 10/28/19 Potential to Achieve Goals: Good    Frequency Min 3X/week   Barriers to discharge        Co-evaluation PT/OT/SLP Co-Evaluation/Treatment: Yes Reason for Co-Treatment: Necessary to address cognition/behavior during functional activity;For patient/therapist safety;To address functional/ADL transfers PT goals addressed during session: Mobility/safety with mobility;Balance;Proper use of DME;Strengthening/ROM OT goals addressed during session: ADL's and self-care       AM-PAC PT "6 Clicks" Mobility  Outcome Measure Help needed turning from your back to your side while in a flat bed without using bedrails?: A Little Help needed moving from lying on your back to sitting on the side of a flat bed without using bedrails?: A Little Help needed moving to and from a bed to a chair (including a wheelchair)?: A Little Help needed standing up from a chair using your arms (e.g., wheelchair or bedside chair)?: None Help needed to walk in hospital room?: A Little Help needed climbing 3-5 steps with a railing? : A Lot 6 Click Score: 18    End of Session Equipment Utilized During Treatment: Gait belt;Oxygen Activity Tolerance: Patient tolerated treatment well;Patient limited by fatigue Patient left: in chair;with call bell/phone within reach;with chair alarm set Nurse Communication: Mobility status;Other (comment)(O2 needs) PT Visit Diagnosis: Muscle weakness (generalized) (M62.81);Other abnormalities of gait and mobility (R26.89);Difficulty in walking, not elsewhere classified (R26.2)    Time: [id number] PT Time Calculation (min) (ACUTE ONLY): 34 min   Charges:   PT Evaluation $PT Eval Moderate Complexity: 1 Mod  PT Treatments $Gait Training: 8-22 mins        Karma Ganja, PT, DPT   Acute Rehabilitation Department Pager #: [phone]  Otho Bellows 10/14/2019, 1:31 PM

## 2019-10-14 NOTE — Progress Notes (Signed)
PROGRESS NOTE  Bobby Caldwell [id number] DOB: 08-Feb-1956   PCP: Patient, No Pcp Per  Patient is from: Home.  DOA: 10/12/2019 LOS: 2  Brief Narrative / Interim history: 64 year old male with history of COPD/chronic RF on 2 L, gastric cancer status post gastrectomy and chemo in 11/2017, ongoing tobacco use, COVID-19 infection in 06/2019 and severe malnutrition presenting with generalized weakness, fatigue, shortness of breath, DOE, fever to 101 and altered mental status, and admitted for acute on chronic respiratory failure due to COPD exacerbation.  Reportedly desaturated to 58% at home.  In ED, slightly tachycardic.  Tachypneic to 28. Na 129.  Bicarb 34.  WBC 9.7.  LA 1.7.  VBG 7.23/90.4/39/38.  CXR and CT head without acute finding.  Patient was started on BiPAP.  Repeat ABG 7.36/62.8/32.0/35.5.  He underwent lumbar puncture.  CSF fluid with slightly elevated protein.  Patient was started on broad-spectrum antibiotics, and admitted for fever and acute on chronic respiratory failure with hypoxia and hypercarbia due to COPD exacerbation.   Patient remained afebrile since admission.  Cultures negative.  Antibiotic disc related to p.o. doxycycline for COPD exacerbation.  Subjective: Seen and examined earlier this morning.  No major events overnight or this morning.  He states "breathing is not good".  Saturating in low 90s on 3 L at rest.  Also coughing with some phlegm.  He denies hemoptysis.  Denies pain anywhere.  Denies GI or UTI symptoms.  Patient speaks [place], which is not available on video interpreter.  Attempted to call patient's daughter but no answer.  Objective: Vitals:   10/14/19 0400 10/14/19 0700 10/14/19 0735 10/14/19 0816  BP: 114/62 137/80  128/70  Pulse: (!) 114 65 (!) 113 (!) 118  Resp:  20 20 20   Temp: 99.3 F (37.4 C)   98.6 F (37 C)  TempSrc: Oral   Oral  SpO2: 96% 100% 97% 96%  Weight:      Height:        Intake/Output Summary (Last 24 hours) at 10/14/2019  1104 Last data filed at 10/14/2019 0900 Gross per 24 hour  Intake 1069.98 ml  Output 1350 ml  Net -280.02 ml   Filed Weights   10/12/19 1012 10/12/19 1747  Weight: 45.4 kg 45.6 kg    Examination:  GENERAL: Frail and chronically ill-appearing.  No distress. HEENT: MMM.  Vision and hearing grossly intact.  NECK: Supple.  No apparent JVD.  RESP: 90% on 3 L.  No IWOB.  Diminished aeration bilaterally. CVS: Tachycardic to 120s.  Regular rhythm.  Heart sounds normal.  ABD/GI/GU: Bowel sounds present. Soft. Non tender.  MSK/EXT:  Moves extremities. No apparent deformity. No edema.  SKIN: no apparent skin lesion or wound NEURO: Awake, alert and oriented self, place and situation.  No apparent focal neuro deficit. PSYCH: Calm. Normal affect.   Procedures:  4/4-lumbar puncture  Microbiology summarized: 4/4-COVID-19 negative. 4/4-MRSA PCR negative. 4/4-urine culture and CSF culture NGTD. 4/4-blood culture NGTD  Assessment & Plan: Acute on chronic RF with hypercarbia and hypoxia due to COPD exacerbation-patient is on 2 L at baseline. -Minimal oxygen to maintain saturation above 90%. -Continue IV Solu-Medrol. -Continue nebulizers-Pulmicort and Brovana -Change DuoNeb to Xopenex and Atrovent in the setting of tachycardia. -De-escalate antibiotic to doxycycline -PT/OT eval  Febrile illness: Febrile to 101 on arrival. Portable CXR without significant finding.  Infectious work-up unrevealing so far. -De-escalate antibiotic to doxycycline for COPD.  Acute metabolic encephalopathy: Likely due to hypercarbia in the setting of respiratory failure.  PCO2  was 90.4 on arrival.  CT head without acute finding.  No focal neuro deficit. He is oriented to self, place and situation now which might be his baseline.. -Treat respiratory failure as above -Frequent orientation and delirium precautions.  Sinus tachycardia: Tachycardic to 110s to 120s likely from breathing treatments.  Not symptomatic.   TSH low but free T4 within normal.  Recent echo benign except for mild to moderate pulmonary hypertension. -Adjust nebulizers as above. -Scheduled Cardizem 30 mg 3 times daily.  History of gastric cancer: s/p partial gastrectomy with neoadjuvant chemo in 2019. EGD by Dr. [name] in 11/2018 which revealed a adenomatous polyp that was removed. -Outpatient follow-up.  Hyponatremia:  TSH low but free T4 normal.  Urine osmolality high at 546. A.m. cortisol low at 5.0 but unreliable while on Solu-Medrol.  Resolved. -Check urine sodium for completeness  Tobacco use disorder: Smokes about 5 cigarettes a day on average. -Encourage cessation -Nicotine patch  History of COVID-19 infection: In 06/2019.  Language barrier affecting communication and care: Patient speaks [place] and some [place].  His language is not available on the interpreter iPad. -Family member being used to facilitate encounter when available.  Debility/physical deconditioning -PT/OT eval  Severe malnutrition: Body mass index is 16.73 kg/m.  Significant muscle mass and subcu fat loss on exam. -Consult dietitian -Continue Remeron and multivitamin.                  DVT prophylaxis: Subcu Lovenox Code Status: Full code Family Communication: Attempted to call patient's daughter but no answer.  Discharge barrier: Acute respiratory failure requiring exacerbation on IV steroid and therapy evaluation Patient is from: Home Final disposition: Likely home in the next 24 hours  Consultants:  None   Sch Meds:  Scheduled Meds: . arformoterol  15 mcg Nebulization BID  . budesonide (PULMICORT) nebulizer solution  0.5 mg Nebulization BID  . Chlorhexidine Gluconate Cloth  6 each Topical Daily  . diltiazem  30 mg Oral Q8H  . doxycycline  100 mg Oral Q12H  . enoxaparin (LOVENOX) injection  40 mg Subcutaneous Q24H  . ipratropium-albuterol  3 mL Inhalation QID  . megestrol  200 mg Oral QID  . methylPREDNISolone  (SOLU-MEDROL) injection  60 mg Intravenous Q8H  . mirtazapine  15 mg Oral QHS  . nicotine  14 mg Transdermal Daily  . sodium chloride flush  3 mL Intravenous Q12H   Continuous Infusions: . sodium chloride 60 mL/hr at 10/13/19 1538   PRN Meds:.acetaminophen **OR** acetaminophen, ondansetron **OR** ondansetron (ZOFRAN) IV  Antimicrobials: Anti-infectives (From admission, onward)   Start     Dose/Rate Route Frequency Ordered Stop   10/14/19 1000  doxycycline (VIBRA-TABS) tablet 100 mg     100 mg Oral Every 12 hours 10/14/19 0715 10/19/19 0959   10/13/19 1200  vancomycin (VANCOREADY) IVPB 750 mg/150 mL  Status:  Discontinued     750 mg 150 mL/hr over 60 Minutes Intravenous Every 24 hours 10/12/19 1137 10/14/19 0715   10/12/19 2200  ceFEPIme (MAXIPIME) 2 g in sodium chloride 0.9 % 100 mL IVPB  Status:  Discontinued     2 g 200 mL/hr over 30 Minutes Intravenous Every 8 hours 10/12/19 1137 10/14/19 0715   10/12/19 1015  ceFEPIme (MAXIPIME) 2 g in sodium chloride 0.9 % 100 mL IVPB     2 g 200 mL/hr over 30 Minutes Intravenous  Once 10/12/19 1001 10/12/19 1250   10/12/19 1015  metroNIDAZOLE (FLAGYL) IVPB 500 mg     500  mg 100 mL/hr over 60 Minutes Intravenous  Once 10/12/19 1001 10/12/19 1151   10/12/19 1015  vancomycin (VANCOCIN) IVPB 1000 mg/200 mL premix     1,000 mg 200 mL/hr over 60 Minutes Intravenous  Once 10/12/19 1001 10/12/19 1200       I have personally reviewed the following labs and images: CBC: Recent Labs  Lab 10/12/19 1007 10/12/19 1020 10/12/19 1217 10/13/19 0235 10/14/19 0704  WBC 9.7  --   --  1.8* 6.5  NEUTROABS 7.9*  --   --   --   --   HGB 15.9 17.7* 13.6 14.3 12.5*  HCT 48.8 52.0 40.0 44.1 39.5  MCV 85.6  --   --  85.8 86.2  PLT 303  --   --  249 260   BMP &GFR Recent Labs  Lab 10/12/19 1007 10/12/19 1020 10/12/19 1217 10/13/19 0235 10/14/19 0305 10/14/19 0704  NA 129* 127* 122* 133* 138  --   K 4.4 4.3 3.8 4.7 4.3  --   CL 83*  --   --  88*  95*  --   CO2 34*  --   --  34* 35*  --   GLUCOSE 138*  --   --  103* 145*  --   BUN 10  --   --  8 11  --   CREATININE 0.65  --   --  0.58* 0.43*  --   CALCIUM 8.9  --   --  8.5* 8.1*  --   MG  --   --   --   --   --  1.8  PHOS  --   --   --   --  2.6  --    Estimated Creatinine Clearance: 61.8 mL/min (A) (by C-G formula based on SCr of 0.43 mg/dL (L)). Liver & Pancreas: Recent Labs  Lab 10/12/19 1007 10/14/19 0305  AST 21  --   ALT 18  --   ALKPHOS 82  --   BILITOT 0.8  --   PROT 6.4*  --   ALBUMIN 3.5 2.8*   No results for input(s): LIPASE, AMYLASE in the last 168 hours. No results for input(s): AMMONIA in the last 168 hours. Diabetic: No results for input(s): HGBA1C in the last 72 hours. No results for input(s): GLUCAP in the last 168 hours. Cardiac Enzymes: No results for input(s): CKTOTAL, CKMB, CKMBINDEX, TROPONINI in the last 168 hours. No results for input(s): PROBNP in the last 8760 hours. Coagulation Profile: Recent Labs  Lab 10/12/19 1310  INR 1.1   Thyroid Function Tests: Recent Labs    10/13/19 1207  TSH 0.101*  FREET4 1.01   Lipid Profile: No results for input(s): CHOL, HDL, LDLCALC, TRIG, CHOLHDL, LDLDIRECT in the last 72 hours. Anemia Panel: No results for input(s): VITAMINB12, FOLATE, FERRITIN, TIBC, IRON, RETICCTPCT in the last 72 hours. Urine analysis:    Component Value Date/Time   COLORURINE YELLOW 10/12/2019 1024   APPEARANCEUR CLEAR 10/12/2019 1024   LABSPEC 1.014 10/12/2019 1024   PHURINE 6.0 10/12/2019 1024   GLUCOSEU NEGATIVE 10/12/2019 1024   HGBUR NEGATIVE 10/12/2019 1024   BILIRUBINUR NEGATIVE 10/12/2019 1024   KETONESUR 20 (A) 10/12/2019 1024   PROTEINUR NEGATIVE 10/12/2019 1024   NITRITE NEGATIVE 10/12/2019 1024   LEUKOCYTESUR NEGATIVE 10/12/2019 1024   Sepsis Labs: Invalid input(s): PROCALCITONIN, [place]  Microbiology: Recent Results (from the past 240 hour(s))  Blood Culture (routine x 2)     Status: None  (Preliminary result)  Collection Time: 10/12/19 10:00 AM   Specimen: BLOOD RIGHT ARM  Result Value Ref Range Status   Specimen Description BLOOD RIGHT ARM  Final   Special Requests   Final    BOTTLES DRAWN AEROBIC AND ANAEROBIC Blood Culture adequate volume   Culture   Final    NO GROWTH 2 DAYS Performed at [facility], [address] [place], [place] [address]    Report Status PENDING  Incomplete  Blood Culture (routine x 2)     Status: None (Preliminary result)   Collection Time: 10/12/19 10:08 AM   Specimen: BLOOD  Result Value Ref Range Status   Specimen Description BLOOD LEFT ANTECUBITAL  Final   Special Requests   Final    BOTTLES DRAWN AEROBIC AND ANAEROBIC Blood Culture adequate volume   Culture   Final    NO GROWTH 2 DAYS Performed at [facility], [place] [address], [place], [place] [address]    Report Status PENDING  Incomplete  Urine culture     Status: None   Collection Time: 10/12/19 10:24 AM   Specimen: In/Out Cath Urine  Result Value Ref Range Status   Specimen Description IN/OUT CATH URINE  Final   Special Requests NONE  Final   Culture   Final    NO GROWTH Performed at [facility], [place] [address], [place], [place] [address]    Report Status 10/13/2019 FINAL  Final  SARS CORONAVIRUS 2 (TAT 6-24 HRS) Nasopharyngeal Nasopharyngeal Swab     Status: None   Collection Time: 10/12/19 10:55 AM   Specimen: Nasopharyngeal Swab  Result Value Ref Range Status   SARS Coronavirus 2 NEGATIVE NEGATIVE Final    Comment: (NOTE) SARS-CoV-2 target nucleic acids are NOT DETECTED. The SARS-CoV-2 RNA is generally detectable in upper and lower respiratory specimens during the acute phase of infection. Negative results do not preclude SARS-CoV-2 infection, do not rule out co-infections with other pathogens, and should not be used as the sole basis for treatment or other patient management decisions. Negative results must be combined with clinical  observations, patient history, and epidemiological information. The expected result is Negative. Fact Sheet for Patients: SugarRoll.be Fact Sheet for Healthcare Providers: https://www.woods-mathews.com/ This test is not yet approved or cleared by the [place] FDA and  has been authorized for detection and/or diagnosis of SARS-CoV-2 by FDA under an Emergency Use Authorization (EUA). This EUA will remain  in effect (meaning this test can be used) for the duration of the COVID-19 declaration under Section 56 4(b)(1) of the Act, 21 U.S.C. section 360bbb-3(b)(1), unless the authorization is terminated or revoked sooner. Performed at [facility], [place] [address], [place], [place] [address]   CSF culture     Status: None (Preliminary result)   Collection Time: 10/12/19  1:24 PM   Specimen: CSF; Cerebrospinal Fluid  Result Value Ref Range Status   Specimen Description CSF  Final   Special Requests NONE  Final   Gram Stain NO WBC SEEN NO ORGANISMS SEEN   Final   Culture   Final    NO GROWTH 2 DAYS Performed at [facility], [place] [address], [place],  [address]    Report Status PENDING  Incomplete  MRSA PCR Screening     Status: None   Collection Time: 10/12/19  2:23 PM   Specimen: Nasal Mucosa; Nasopharyngeal  Result Value Ref Range Status   MRSA by PCR NEGATIVE NEGATIVE Final  Comment:        The GeneXpert MRSA Assay (FDA approved for NASAL specimens only), is one component of a comprehensive MRSA colonization surveillance program. It is not intended to diagnose MRSA infection nor to guide or monitor treatment for MRSA infections. Performed at [facility], [place] [address], [place], [place] [address]     Radiology Studies: No results found.  Soul Deveney T. [place]  If 7PM-7AM, please contact night-coverage [email or web address] Password [facility] 10/14/2019, 11:04 AM

## 2019-10-14 NOTE — Progress Notes (Signed)
SATURATION QUALIFICATIONS: (This note is used to comply with regulatory documentation for home oxygen)  Patient Saturations on Room Air at Rest = 87%  Patient Saturations on Room Air while Ambulating = 83%  Patient Saturations on 2 Liters of oxygen while Ambulating = 94%  Please briefly explain why patient needs home oxygen:  Patient desats to the low 80's very quickly upon removing his 2L [place]

## 2019-10-15 LAB — CBC
HCT: 38.7 % — ABNORMAL LOW (ref 39.0–52.0)
Hemoglobin: 12.3 g/dL — ABNORMAL LOW (ref 13.0–17.0)
MCH: 27.4 pg (ref 26.0–34.0)
MCHC: 31.8 g/dL (ref 30.0–36.0)
MCV: 86.2 fL (ref 80.0–100.0)
Platelets: 235 10*3/uL (ref 150–400)
RBC: 4.49 MIL/uL (ref 4.22–5.81)
RDW: 13.6 % (ref 11.5–15.5)
WBC: 7.4 10*3/uL (ref 4.0–10.5)
nRBC: 0 % (ref 0.0–0.2)

## 2019-10-15 LAB — RENAL FUNCTION PANEL
Albumin: 2.7 g/dL — ABNORMAL LOW (ref 3.5–5.0)
Anion gap: 7 (ref 5–15)
BUN: 12 mg/dL (ref 8–23)
CO2: 39 mmol/L — ABNORMAL HIGH (ref 22–32)
Calcium: 8.1 mg/dL — ABNORMAL LOW (ref 8.9–10.3)
Chloride: 94 mmol/L — ABNORMAL LOW (ref 98–111)
Creatinine, Ser: 0.44 mg/dL — ABNORMAL LOW (ref 0.61–1.24)
GFR calc Af Amer: >60 mL/min (ref >60)
GFR calc non Af Amer: >60 mL/min (ref >60)
Glucose, Bld: 144 mg/dL — ABNORMAL HIGH (ref 70–99)
Phosphorus: 2.2 mg/dL — ABNORMAL LOW (ref 2.5–4.6)
Potassium: 4.3 mmol/L (ref 3.5–5.1)
Sodium: 140 mmol/L (ref 135–145)

## 2019-10-15 LAB — CSF CULTURE W GRAM STAIN
Culture: NO GROWTH
Gram Stain: NONE SEEN

## 2019-10-15 LAB — MAGNESIUM: Magnesium: 1.9 mg/dL (ref 1.7–2.4)

## 2019-10-15 MED ORDER — PREDNISONE 10 MG PO TABS: ORAL_TABLET | ORAL | 0 refills | Status: DC

## 2019-10-15 MED ORDER — DOXYCYCLINE HYCLATE 100 MG PO TABS: 100.0000 mg | ORAL_TABLET | Freq: Two times a day (BID) | ORAL | 0 refills | Status: AC

## 2019-10-15 NOTE — Plan of Care (Signed)
  Problem: Education: Goal: Knowledge of General Education information will improve Description: Including pain rating scale, medication(s)/side effects and non-pharmacologic comfort measures 10/15/2019 1304 by Glenard Haring, RN Outcome: Adequate for Discharge 10/15/2019 1303 by Glenard Haring, RN Outcome: Adequate for Discharge   Problem: Health Behavior/Discharge Planning: Goal: Ability to manage health-related needs will improve Outcome: Adequate for Discharge   Problem: Clinical Measurements: Goal: Ability to maintain clinical measurements within normal limits will improve Outcome: Adequate for Discharge Goal: Will remain free from infection Outcome: Adequate for Discharge Goal: Diagnostic test results will improve Outcome: Adequate for Discharge Goal: Respiratory complications will improve Outcome: Adequate for Discharge Goal: Cardiovascular complication will be avoided Outcome: Adequate for Discharge   Problem: Nutrition: Goal: Adequate nutrition will be maintained Outcome: Adequate for Discharge   Problem: Coping: Goal: Level of anxiety will decrease Outcome: Adequate for Discharge   Problem: Elimination: Goal: Will not experience complications related to bowel motility Outcome: Adequate for Discharge Goal: Will not experience complications related to urinary retention Outcome: Adequate for Discharge   Problem: Pain Managment: Goal: General experience of comfort will improve Outcome: Adequate for Discharge   Problem: Safety: Goal: Ability to remain free from injury will improve Outcome: Adequate for Discharge   Problem: Skin Integrity: Goal: Risk for impaired skin integrity will decrease Outcome: Adequate for Discharge

## 2019-10-15 NOTE — Progress Notes (Signed)
Discharge instructions given to Bobby Caldwell and his daughter who served as interpreter. Discussed new medications, medication changes, and side effects to watch for.  Discussed signs and symptoms to watch for and when to contact the doctor.  Discussed follow up appointments.  Verbalized understanding.

## 2019-10-15 NOTE — Discharge Summary (Signed)
Physician Discharge Summary  Bobby Caldwell [id number] DOB: 04-11-1956 DOA: 10/12/2019  PCP: Patient, No Pcp Per  Admit date: 10/12/2019 Discharge date: 10/15/2019  Admitted From: Home Disposition:  Home with home health   Recommendations for Outpatient Follow-up:  1. 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  Reportedly desaturated to 58% at home.   In ED, slightly tachycardic.  Tachypneic to 28. Na 129.  Bicarb 34.  WBC 9.7.  LA 1.7.  VBG 7.23/90.4/39/38.  CXR and CT head without acute finding.  Patient was started on BiPAP.  Repeat ABG 7.36/62.8/32.0/35.5.  He underwent lumbar puncture.  CSF fluid with slightly elevated protein.  Patient was started on broad-spectrum antibiotics and admitted for fever and acute on chronic respiratory failure with hypoxia and hypercarbia due to COPD exacerbation.   Patient remained afebrile since admission.  Cultures negative.  Antibiotics deescalated to p.o. doxycycline for COPD exacerbation. On day of discharge, patient stated breathing at rest was good, some shortness of breath with activity and back to baseline O2 requirement.   Discharge Diagnoses:  Principal Problem:   Acute on chronic respiratory failure with hypoxia and hypercapnia (HCC) Active Problems:   History of gastric cancer   COPD with acute exacerbation (HCC)   Hyponatremia   History of COVID-19   Acute on chronic respiratory failure with hypercarbia and hypoxia due to COPD  exacerbation -Patient is on 2 L at baseline. -IV Solu-Medrol --> prednisone taper on dc  -Continue home breathing tx and inhalers -De-escalate antibiotic to doxycycline  Febrile illness -Febrile to 101 on arrival. Portable CXR without significant finding.  Infectious work-up unrevealing so far. Has now been afebrile.  -De-escalate antibiotic to doxycycline for COPD.  Acute metabolic encephalopathy -Likely due to hypercarbia in the setting of respiratory failure.  PCO2 was 90.4 on arrival.  CT head without acute finding.  No focal neuro deficit -Resolved   Sinus tachycardia -Improved   History of gastric cancer: s/p partial gastrectomy with neoadjuvant chemo in 2019. EGD by Dr. [name] in 11/2018 which revealed a adenomatous polyp that was removed. -Outpatient follow-up with oncology   Hyponatremia -Resolved, Na 140   Tobacco use disorder: Smokes about 5 cigarettes a day on average. -Encourage cessation -Nicotine patch   History of COVID-19 infection: In 06/2019  Debility/physical deconditioning -PT/OT eval rec home health   Severe malnutrition, Underweight Estimated body mass index is 16.73 kg/m as calculated from the following:   Height as of this encounter: 5\' 5"  (1.651 m).   Weight as of this encounter: 45.6 kg.   Discharge Instructions  Discharge Instructions    Call MD for:  difficulty breathing, headache or visual disturbances   Complete by: As directed    Call MD for:  extreme fatigue   Complete by: As directed    Call MD for:  persistant dizziness or light-headedness   Complete by: As directed    Call MD for:  persistant nausea and vomiting   Complete by: As directed    Call MD for:  severe uncontrolled pain  Complete by: As directed    Call MD for:  temperature >100.4   Complete by: As directed    Diet - low sodium heart healthy   Complete by: As directed    Increase activity slowly   Complete by: As directed      Allergies as of 10/15/2019    No Known Allergies     Medication List    TAKE these medications   budesonide-formoterol 160-4.5 MCG/ACT inhaler Commonly known as: Symbicort Inhale 2 puffs into the lungs 2 (two) times daily.   doxycycline 100 MG tablet Commonly known as: VIBRA-TABS Take 1 tablet (100 mg total) by mouth every 12 (twelve) hours for 3 days.   feeding supplement (ENSURE ENLIVE) Liqd Take 237 mLs by mouth 2 (two) times daily between meals.   feeding supplement (PRO-STAT SUGAR FREE 64) Liqd Take 30 mLs by mouth 2 (two) times daily.   ipratropium-albuterol 0.5-2.5 (3) MG/3ML Soln Commonly known as: DUONEB Inhale 3 mLs into the lungs 4 (four) times daily. Dx:J44.9   megestrol 40 MG/ML suspension Commonly known as: MEGACE Take 200 mg by mouth 4 (four) times daily.   mirtazapine 15 MG tablet Commonly known as: Remeron Take 1 tablet (15 mg total) by mouth at bedtime.   multivitamin with minerals Tabs tablet Take 1 tablet by mouth daily.   OXYGEN Inhale 2 L into the lungs continuous.   predniSONE 10 MG tablet Commonly known as: DELTASONE Take 4 tabs for 3 days, then 3 tabs for 3 days, then 2 tabs for 3 days, then 1 tab for 3 days, then 1/2 tab for 4 days.   Spiriva Respimat 2.5 MCG/ACT Aers Generic drug: Tiotropium Bromide Monohydrate Inhale 2 puffs into the lungs daily.      Follow-up Information    Olalere, Adewale A, MD. Schedule an appointment as soon as possible for a visit in 1 week(s).   Specialty: Pulmonary Disease Why: COPD exacerbation, hospitalization follow up  Contact information: [place] [address] [phone]          No Known Allergies  Consultations:  None    Procedures/Studies: CT Head Wo Contrast  Result Date: 10/12/2019 CLINICAL DATA:  Insert follow-up with the. EXAM: CT HEAD WITHOUT CONTRAST TECHNIQUE: Contiguous axial images were obtained from the base of the skull through the vertex without intravenous contrast.  COMPARISON:  None. FINDINGS: Brain: No evidence of acute infarction, hemorrhage, hydrocephalus, extra-axial collection or mass lesion/mass effect. Vascular: No hyperdense vessel or unexpected calcification. Skull: Normal. Negative for fracture or focal lesion. Sinuses/Orbits: No acute finding. Other: None IMPRESSION: No acute intracranial abnormality. Electronically Signed   By: Fidela Salisbury M.D.   On: 10/12/2019 10:47   DG Chest Port 1 View  Result Date: 10/12/2019 CLINICAL DATA:  Weakness and lethargy. History of stomach cancer. EXAM: PORTABLE CHEST 1 VIEW COMPARISON:  September 10, 2019 FINDINGS: Injectable port in stable position. Cardiomediastinal silhouette is normal. Mediastinal contours appear intact. Hyperinflation and emphysematous changes of the lungs. There is no evidence of focal airspace consolidation, pleural effusion or pneumothorax. Osseous structures are without acute abnormality. Soft tissues are grossly normal. IMPRESSION: 1. No active disease in the chest. 2. Emphysematous changes of the lungs. Electronically Signed   By: Fidela Salisbury M.D.   On: 10/12/2019 10:52   VAS [place] ABI WITH/WO TBI  Result Date: 09/30/2019 LOWER EXTREMITY DOPPLER STUDY Indications: Peripheral artery disease. High Risk Factors: Hypertension, current smoker.  Vascular Interventions: 05-08-2009  Left CIA stent                         Left to right femoro-femoral bypass graft. Comparison Study: 03/04/2019                   RT: 1.00                   LT: 1.01 Performing Technologist: Caralee Ates BA, RVT, RDMS  Examination Guidelines: A complete evaluation includes at minimum, Doppler waveform signals and systolic blood pressure reading at the level of bilateral brachial, anterior tibial, and posterior tibial arteries, when vessel segments are accessible. Bilateral testing is considered an integral part of a complete examination. Photoelectric Plethysmograph (PPG) waveforms and toe  systolic pressure readings are included as required and additional duplex testing as needed. Limited examinations for reoccurring indications may be performed as noted.  ABI Findings: +---------+------------------+-----+---------+--------+ Right    Rt Pressure (mmHg)IndexWaveform Comment  +---------+------------------+-----+---------+--------+ Brachial 122                    triphasic         +---------+------------------+-----+---------+--------+ PTA      159               1.23 triphasic         +---------+------------------+-----+---------+--------+ DP       144               1.12 triphasic         +---------+------------------+-----+---------+--------+ Great Toe95                0.74 Normal            +---------+------------------+-----+---------+--------+ +---------+------------------+-----+---------+-------+ Left     Lt Pressure (mmHg)IndexWaveform Comment +---------+------------------+-----+---------+-------+ Brachial 129                    triphasic        +---------+------------------+-----+---------+-------+ PTA      159               1.23 triphasic        +---------+------------------+-----+---------+-------+ DP       149               1.16 triphasic        +---------+------------------+-----+---------+-------+ Doristine Devoid Toe116               0.90 Normal           +---------+------------------+-----+---------+-------+ +-------+-----------+-----------+------------+------------+ ABI/TBIToday's ABIToday's TBIPrevious ABIPrevious TBI +-------+-----------+-----------+------------+------------+ Right  1.23       0.74       1.00        0.79         +-------+-----------+-----------+------------+------------+ Left   1.23       0.90       1.01        1.02         +-------+-----------+-----------+------------+------------+ Bilateral ABIs and TBIs appear essentially unchanged compared to prior study on 03/04/2019.  Summary: Right: Resting right  ankle-brachial index is within normal range. No evidence of significant right lower extremity arterial disease. The right toe-brachial index is normal. RT great toe pressure = 95 mmHg. Left: Resting left ankle-brachial index is within normal range. No evidence of significant left lower extremity arterial disease. The left toe-brachial index is normal. LT Great toe pressure = 116 mmHg.  *See table(s) above for measurements and observations.  Electronically signed by Curt Jews  MD on 09/30/2019 at 12:38:12 PM.    Final    VAS US AORTA/IVC/ILIACS  Result Date: 09/30/2019 ABDOMINAL AORTA STUDY Indications: Peripheral Arterial Disease Risk Factors: Hypertension, current smoker. Vascular Interventions: 05-08-2009                         Left CIA stent                         Left to right femoro-femoral bypass graft. Limitations: Air/bowel gas and patient discomfort.  Comparison Study: 03/04/2019:                   Patent left common iliac artery stent with no visualized                   stenosis.                   Patent lef to right FFBPG with increased velocity noted                   (50-74% stenosis) at the proximal anastamosis on the left, no                   plaque visualized and 50-74% stenosis at the right outflow                   segment. Performing Technologist: Caralee Ates BA, RVT, RDMS  Examination Guidelines: A complete evaluation includes B-mode imaging, spectral Doppler, color Doppler, and power Doppler as needed of all accessible portions of each vessel. Bilateral testing is considered an integral part of a complete examination. Limited examinations for reoccurring indications may be performed as noted.  Left Stent(s): +-------------------+--------+---------------+---------+--------+ Common Iliac ArteryPSV cm/sStenosis       Waveform Comments +-------------------+--------+---------------+---------+--------+ Prox to Stent      28                     triphasic          +-------------------+--------+---------------+---------+--------+ Proximal Stent     118                    triphasic         +-------------------+--------+---------------+---------+--------+ Mid Stent          204     50-99% stenosistriphasic         +-------------------+--------+---------------+---------+--------+ Distal Stent       195                    triphasic         +-------------------+--------+---------------+---------+--------+ Distal to Stent    203                    triphasic         +-------------------+--------+---------------+---------+--------+ Left Graft #1: +--------------------+--------+---------------+---------+--------+                     PSV cm/sStenosis       Waveform Comments +--------------------+--------+---------------+---------+--------+ Inflow              230                    triphasic         +--------------------+--------+---------------+---------+--------+ Proximal Anastomosis254                    triphasic         +--------------------+--------+---------------+---------+--------+  Proximal Graft      304     50-70% stenosistriphasic         +--------------------+--------+---------------+---------+--------+ Mid Graft           39                     triphasic         +--------------------+--------+---------------+---------+--------+ Distal Graft        35                     triphasic         +--------------------+--------+---------------+---------+--------+ Distal Anastamosis  60                     triphasic         +--------------------+--------+---------------+---------+--------+ Outflow             75                     triphasic         +--------------------+--------+---------------+---------+--------+  Summary: Abdominal Aorta: Widely patent left to right femoro-femoral bypass graft. Elevated velocities noted at the proximal anastamosis and proximal graft suggesting a stenosis of 50-70%. Unable to  duplicate elevated velocities noted at the distal anastamosis and the outflow artery as noted on prior exam dated 03/04/2019. Thrombus noted within the distal aorta. Patent left common iliac artery stent. Elevated velocities at the mid stent suggest a stenosis of 50-99%. Velocities are at the low end of stenosis category range. Stenosis: +-----------------+---------------+ Location         Stent           +-----------------+---------------+ Left Common Iliac50-99% stenosis +-----------------+---------------+   *See table(s) above for measurements and observations.  Electronically signed by Curt Jews MD on 09/30/2019 at 12:38:01 PM.    Final        Discharge Exam: Vitals:   10/15/19 0743 10/15/19 0829  BP: 118/67 109/68  Pulse: (!) 105 93  Resp: (!) 22 18  Temp:  98.4 F (36.9 C)  SpO2: 96% 98%    General: Pt is alert, awake, not in acute distress Cardiovascular: RRR, S1/S2 +, no edema Respiratory: Diminished breath sounds bilaterally, no wheezing, no rhonchi, no respiratory distress, no conversational dyspnea  Abdominal: Soft, NT, ND, bowel sounds + Extremities: no edema, no cyanosis Psych: Normal mood and affect, stable judgement and insight     The results of significant diagnostics from this hospitalization (including imaging, microbiology, ancillary and laboratory) are listed below for reference.     Microbiology: Recent Results (from the past 240 hour(s))  Blood Culture (routine x 2)     Status: None (Preliminary result)   Collection Time: 10/12/19 10:00 AM   Specimen: BLOOD RIGHT ARM  Result Value Ref Range Status   Specimen Description BLOOD RIGHT ARM  Final   Special Requests   Final    BOTTLES DRAWN AEROBIC AND ANAEROBIC Blood Culture adequate volume   Culture   Final    NO GROWTH 3 DAYS Performed at [facility], [address], [place], [place] [address]    Report Status PENDING  Incomplete  Blood Culture (routine x 2)     Status: None (Preliminary  result)   Collection Time: 10/12/19 10:08 AM   Specimen: BLOOD  Result Value Ref Range Status   Specimen Description BLOOD LEFT ANTECUBITAL  Final   Special Requests   Final    BOTTLES DRAWN AEROBIC AND  ANAEROBIC Blood Culture adequate volume   Culture   Final    NO GROWTH 3 DAYS Performed at [facility], [place] [address] [place], [place] [address]    Report Status PENDING  Incomplete  Urine culture     Status: None   Collection Time: 10/12/19 10:24 AM   Specimen: In/Out Cath Urine  Result Value Ref Range Status   Specimen Description IN/OUT CATH URINE  Final   Special Requests NONE  Final   Culture   Final    NO GROWTH Performed at [facility], [place] [address] [place], [place] [address]    Report Status 10/13/2019 FINAL  Final  SARS CORONAVIRUS 2 (TAT 6-24 HRS) Nasopharyngeal Nasopharyngeal Swab     Status: None   Collection Time: 10/12/19 10:55 AM   Specimen: Nasopharyngeal Swab  Result Value Ref Range Status   SARS Coronavirus 2 NEGATIVE NEGATIVE Final    Comment: (NOTE) SARS-CoV-2 target nucleic acids are NOT DETECTED. The SARS-CoV-2 RNA is generally detectable in upper and lower respiratory specimens during the acute phase of infection. Negative results do not preclude SARS-CoV-2 infection, do not rule out co-infections with other pathogens, and should not be used as the sole basis for treatment or other patient management decisions. Negative results must be combined with clinical observations, patient history, and epidemiological information. The expected result is Negative. Fact Sheet for Patients: SugarRoll.be Fact Sheet for Healthcare Providers: https://www.woods-mathews.com/ This test is not yet approved or cleared by the [place] FDA and  has been authorized for detection and/or diagnosis of SARS-CoV-2 by FDA under an Emergency Use Authorization (EUA). This EUA will remain  in effect (meaning this test  can be used) for the duration of the COVID-19 declaration under Section 56 4(b)(1) of the Act, 21 U.S.C. section 360bbb-3(b)(1), unless the authorization is terminated or revoked sooner. Performed at [facility], [place] [address], [place], [place] [address]   CSF culture     Status: None (Preliminary result)   Collection Time: 10/12/19  1:24 PM   Specimen: CSF; Cerebrospinal Fluid  Result Value Ref Range Status   Specimen Description CSF  Final   Special Requests NONE  Final   Gram Stain NO WBC SEEN NO ORGANISMS SEEN   Final   Culture   Final    NO GROWTH 3 DAYS Performed at [facility], [place] [address], [place], [place] [address]    Report Status PENDING  Incomplete  MRSA PCR Screening     Status: None   Collection Time: 10/12/19  2:23 PM   Specimen: Nasal Mucosa; Nasopharyngeal  Result Value Ref Range Status   MRSA by PCR NEGATIVE NEGATIVE Final    Comment:        The GeneXpert MRSA Assay (FDA approved for NASAL specimens only), is one component of a comprehensive MRSA colonization surveillance program. It is not intended to diagnose MRSA infection nor to guide or monitor treatment for MRSA infections. Performed at [facility], [place] [address] [place], [place] [address]      Labs: BNP (last 3 results) Recent Labs    09/10/19 1241  BNP 123456*   Basic Metabolic Panel: Recent Labs  Lab 10/12/19 1007 10/12/19 1007 10/12/19 1020 10/12/19 1217 10/13/19 0235 10/14/19 0305 10/14/19 0704 10/15/19 0254  NA 129*   < > 127* 122* 133* 138  --  140  K 4.4   < > 4.3 3.8 4.7 4.3  --  4.3  CL 83*  --   --   --  88* 95*  --  94*  CO2 34*  --   --   --  34* 35*  --  39*  GLUCOSE 138*  --   --   --  103* 145*  --  144*  BUN 10  --   --   --  8 11  --  12  CREATININE 0.65  --   --   --  0.58* 0.43*  --  0.44*  CALCIUM 8.9  --   --   --  8.5* 8.1*  --  8.1*  MG  --   --   --   --   --   --  1.8 1.9  PHOS  --   --   --   --   --  2.6  --  2.2*   < > =  values in this interval not displayed.   Liver Function Tests: Recent Labs  Lab 10/12/19 1007 10/14/19 0305 10/15/19 0254  AST 21  --   --   ALT 18  --   --   ALKPHOS 82  --   --   BILITOT 0.8  --   --   PROT 6.4*  --   --   ALBUMIN 3.5 2.8* 2.7*   No results for input(s): LIPASE, AMYLASE in the last 168 hours. No results for input(s): AMMONIA in the last 168 hours. CBC: Recent Labs  Lab 10/12/19 1007 10/12/19 1007 10/12/19 1020 10/12/19 1217 10/13/19 0235 10/14/19 0704 10/15/19 0254  WBC 9.7  --   --   --  1.8* 6.5 7.4  NEUTROABS 7.9*  --   --   --   --   --   --   HGB 15.9   < > 17.7* 13.6 14.3 12.5* 12.3*  HCT 48.8   < > 52.0 40.0 44.1 39.5 38.7*  MCV 85.6  --   --   --  85.8 86.2 86.2  PLT 303  --   --   --  249 260 235   < > = values in this interval not displayed.   Cardiac Enzymes: No results for input(s): CKTOTAL, CKMB, CKMBINDEX, TROPONINI in the last 168 hours. BNP: Invalid input(s): POCBNP CBG: No results for input(s): GLUCAP in the last 168 hours. D-Dimer No results for input(s): DDIMER in the last 72 hours. Hgb A1c No results for input(s): HGBA1C in the last 72 hours. Lipid Profile No results for input(s): CHOL, HDL, LDLCALC, TRIG, CHOLHDL, LDLDIRECT in the last 72 hours. Thyroid function studies Recent Labs    10/13/19 1207  TSH 0.101*   Anemia work up No results for input(s): VITAMINB12, FOLATE, FERRITIN, TIBC, IRON, RETICCTPCT in the last 72 hours. Urinalysis    Component Value Date/Time   COLORURINE YELLOW 10/12/2019 1024   APPEARANCEUR CLEAR 10/12/2019 1024   LABSPEC 1.014 10/12/2019 1024   PHURINE 6.0 10/12/2019 1024   GLUCOSEU NEGATIVE 10/12/2019 1024   HGBUR NEGATIVE 10/12/2019 1024   BILIRUBINUR NEGATIVE 10/12/2019 1024   KETONESUR 20 (A) 10/12/2019 1024   PROTEINUR NEGATIVE 10/12/2019 1024   NITRITE NEGATIVE 10/12/2019 1024   LEUKOCYTESUR NEGATIVE 10/12/2019 1024   Sepsis Labs Invalid input(s): PROCALCITONIN,  WBC,   LACTICIDVEN Microbiology Recent Results (from the past 240 hour(s))  Blood Culture (routine x 2)     Status: None (Preliminary result)   Collection Time: 10/12/19 10:00 AM   Specimen: BLOOD RIGHT ARM  Result Value  Ref Range Status   Specimen Description BLOOD RIGHT ARM  Final   Special Requests   Final    BOTTLES DRAWN AEROBIC AND ANAEROBIC Blood Culture adequate volume   Culture   Final    NO GROWTH 3 DAYS Performed at [facility], [address] [place], [place] [address]    Report Status PENDING  Incomplete  Blood Culture (routine x 2)     Status: None (Preliminary result)   Collection Time: 10/12/19 10:08 AM   Specimen: BLOOD  Result Value Ref Range Status   Specimen Description BLOOD LEFT ANTECUBITAL  Final   Special Requests   Final    BOTTLES DRAWN AEROBIC AND ANAEROBIC Blood Culture adequate volume   Culture   Final    NO GROWTH 3 DAYS Performed at [facility], [place] [address] [place], [place] [address]    Report Status PENDING  Incomplete  Urine culture     Status: None   Collection Time: 10/12/19 10:24 AM   Specimen: In/Out Cath Urine  Result Value Ref Range Status   Specimen Description IN/OUT CATH URINE  Final   Special Requests NONE  Final   Culture   Final    NO GROWTH Performed at [facility], [place] [address], [place], [place] [address]    Report Status 10/13/2019 FINAL  Final  SARS CORONAVIRUS 2 (TAT 6-24 HRS) Nasopharyngeal Nasopharyngeal Swab     Status: None   Collection Time: 10/12/19 10:55 AM   Specimen: Nasopharyngeal Swab  Result Value Ref Range Status   SARS Coronavirus 2 NEGATIVE NEGATIVE Final    Comment: (NOTE) SARS-CoV-2 target nucleic acids are NOT DETECTED. The SARS-CoV-2 RNA is generally detectable in upper and lower respiratory specimens during the acute phase of infection. Negative results do not preclude SARS-CoV-2 infection, do not rule out co-infections with other pathogens, and should not be used as the sole  basis for treatment or other patient management decisions. Negative results must be combined with clinical observations, patient history, and epidemiological information. The expected result is Negative. Fact Sheet for Patients: SugarRoll.be Fact Sheet for Healthcare Providers: https://www.woods-mathews.com/ This test is not yet approved or cleared by the [place] FDA and  has been authorized for detection and/or diagnosis of SARS-CoV-2 by FDA under an Emergency Use Authorization (EUA). This EUA will remain  in effect (meaning this test can be used) for the duration of the COVID-19 declaration under Section 56 4(b)(1) of the Act, 21 U.S.C. section 360bbb-3(b)(1), unless the authorization is terminated or revoked sooner. Performed at [facility], [place] [address] [place], [place] [address]   CSF culture     Status: None (Preliminary result)   Collection Time: 10/12/19  1:24 PM   Specimen: CSF; Cerebrospinal Fluid  Result Value Ref Range Status   Specimen Description CSF  Final   Special Requests NONE  Final   Gram Stain NO WBC SEEN NO ORGANISMS SEEN   Final   Culture   Final    NO GROWTH 3 DAYS Performed at [facility], [place] [address], [place], [place] [address]    Report Status PENDING  Incomplete  MRSA PCR Screening     Status: None   Collection Time: 10/12/19  2:23 PM   Specimen: Nasal Mucosa; Nasopharyngeal  Result Value Ref Range Status   MRSA by PCR NEGATIVE NEGATIVE Final    Comment:        The GeneXpert MRSA Assay (FDA approved  for NASAL specimens only), is one component of a comprehensive MRSA colonization surveillance program. It is not intended to diagnose MRSA infection nor to guide or monitor treatment for MRSA infections. Performed at [facility], [place] [address] [place], [place] [address]      Patient was seen and examined on the day of discharge and was found to be in stable condition.  Time coordinating discharge: 40 minutes including assessment and coordination of care, as well as examination of the patient.   SIGNED:  Dessa Phi, DO [facility] 10/15/2019, 10:38 AM

## 2019-10-15 NOTE — Care Management Important Message (Signed)
Important Message  Patient Details  Name: Bobby Caldwell MRN: [id number] Date of Birth: 01-08-1956   Medicare Important Message Given:  Yes     Shelda Altes 10/15/2019, 10:40 AM

## 2019-10-15 NOTE — TOC Transition Note (Signed)
Transition of Care [facility]) - CM/SW Discharge Note Marvetta Gibbons RN, BSN Transitions of Care Unit 4E- RN Case Manager [phone]   Patient Details  Name: Bobby Caldwell MRN: [id number] Date of Birth: 12-05-1955  Transition of Care [facility]) CM/SW Contact:  Dawayne Patricia, RN Phone Number: 10/15/2019, 12:38 PM   Clinical Narrative:    Pt stable for transition home today, orders placed for HHRN/PT/OT- spoke with daughter- [name] over the phone to discuss [facility] choice- will plan to meet her and pt's wife at bedside when they arrive for transport.  Received call from daughter that they were coming to hospital earlier that originally planned- met daughter at the bedside to discuss [facility] needs- list provided Per CMS guidelines from medicare.gov website with star ratings (copy placed in shadow chart)- for choice- after review daughter has selected [facility] as first choice with [facility] as backup if needed. Pt has DME at home- address, phone # confirmed in epic- daughter to be first contact- [phone]- per daughter primary doctor is [name].  Call made to Butch Penny with [facility] for [facility] referral- referral has been accepted with start of care for nursing this Friday April 9.    Final next level of care: [place] Barriers to Discharge: No Barriers Identified   Patient Goals and CMS Choice Patient states their goals for this hospitalization and ongoing recovery are:: return home CMS Medicare.gov Compare Post Acute Care list provided to:: Patient Represenative (must comment)(daughter) Choice offered to / list presented to : Adult Children  Discharge Placement                 Home with [facility]      Discharge Plan and Services   Discharge Planning Services: CM Consult Post Acute Care Choice: Home Health          DME Arranged: N/A DME Agency: NA       HH Arranged: RN, PT, OT [place] Agency: [place] ([facility]) Date [place]: 10/15/19 Time [place]:  1232 Representative spoke with at [place]: [place] ([place]) Interventions     Readmission Risk Interventions Readmission Risk Prevention Plan 10/15/2019  Transportation Screening Complete  PCP or Specialist Appt within 3-5 Days Complete  HRI or [place] Complete  Social Work Consult for [place] Planning/Counseling Complete  Palliative Care Screening Not Applicable  Medication Review Press photographer) Complete  Some recent data might be hidden

## 2019-10-16 ENCOUNTER — Telehealth: Payer: Self-pay | Admitting: Pulmonary Disease

## 2019-10-16 NOTE — Telephone Encounter (Signed)
Called and spoke with [name], Patient's Daughter.  Marijame stated Patient was taken to ED 10/12/19, for copd exacerbation.  [name] stated she noticed  Patient is having slight swelling in feet today.  Advised [name] to have Patient elevate feet, if possible, and seek emergency care, or urgent care, if needed.  [name] stated home health is going to start coming to see him during to week. [name] requested to review medications.  Medications from Patient's med list reviewed.   Advised hospital follow up appointment. Patient last OV with Dr. Ander Slade was 08/05/19. Patient is scheduled with Aaron Edelman, NP, 10/22/19, at 1200. Dr. Ander Slade is not in office next week. [name] stated she would come with Patient to OV. Nothing further at this time.

## 2019-10-17 LAB — CULTURE, BLOOD (ROUTINE X 2)
Culture: NO GROWTH
Culture: NO GROWTH
Special Requests: ADEQUATE
Special Requests: ADEQUATE

## 2019-10-20 ENCOUNTER — Inpatient Hospital Stay: Payer: Medicare Other | Attending: Hematology

## 2019-10-20 ENCOUNTER — Other Ambulatory Visit: Payer: Self-pay

## 2019-10-20 DIAGNOSIS — C169 Malignant neoplasm of stomach, unspecified: Secondary | ICD-10-CM | POA: Diagnosis present

## 2019-10-20 DIAGNOSIS — Z452 Encounter for adjustment and management of vascular access device: Secondary | ICD-10-CM | POA: Insufficient documentation

## 2019-10-20 DIAGNOSIS — Z95828 Presence of other vascular implants and grafts: Secondary | ICD-10-CM

## 2019-10-20 MED ORDER — HEPARIN SOD (PORK) LOCK FLUSH 100 UNIT/ML IV SOLN
500.0000 [IU] | Freq: Once | INTRAVENOUS | Status: AC
  Administered 2019-10-20: 500 [IU]
  Filled 2019-10-20: qty 5

## 2019-10-20 MED ORDER — SODIUM CHLORIDE 0.9% FLUSH
10.0000 mL | Freq: Once | INTRAVENOUS | Status: AC
  Administered 2019-10-20: 10 mL
  Filled 2019-10-20: qty 10

## 2019-10-21 ENCOUNTER — Telehealth: Payer: Self-pay | Admitting: Pulmonary Disease

## 2019-10-21 NOTE — Telephone Encounter (Signed)
ATC  Amber, [facility]. LM to call back.

## 2019-10-21 NOTE — Telephone Encounter (Signed)
Amber, Clint returned call. Amber stated they are needing a verbal order for PT and nursing to go out to Patient. Amber stated per eval  PT, 2 times a week for 4 weeks, then 1 time a week for 4 weeks is recommended and Nursing 1 time a week for 1 week, 2 times a week for 2 weeks , 1 time for 6 weeks, and 2 times prn, is recommended.  Patient has hospital follow up scheduled with Aaron Edelman, NP 10/22/19.  Message routed to Dr.Olalere to advise on [facility] PT and Nursing

## 2019-10-21 NOTE — Telephone Encounter (Signed)
[place] would like nursing 1 x week for one week. 2 x week for two weeks. 1 x week for six weeks. Katie phone number is [phone].

## 2019-10-21 NOTE — Telephone Encounter (Signed)
Katie< RN with [place] requested to have  [facility] nursing 1 time a week for 1 week, 2 times a week for 2 weeks, 1 time a week for 6 weeks.  Message sent to Dr. Ander Slade

## 2019-10-22 ENCOUNTER — Telehealth: Payer: Self-pay

## 2019-10-22 ENCOUNTER — Ambulatory Visit (INDEPENDENT_AMBULATORY_CARE_PROVIDER_SITE_OTHER): Payer: Medicare Other | Admitting: Pulmonary Disease

## 2019-10-22 ENCOUNTER — Encounter: Payer: Self-pay | Admitting: Pulmonary Disease

## 2019-10-22 ENCOUNTER — Other Ambulatory Visit: Payer: Self-pay

## 2019-10-22 VITALS — BP 118/68 | HR 90 | Temp 98.1°F | Ht 65.0 in | Wt 97.8 lb

## 2019-10-22 DIAGNOSIS — J432 Centrilobular emphysema: Secondary | ICD-10-CM | POA: Diagnosis not present

## 2019-10-22 DIAGNOSIS — Z8616 Personal history of COVID-19: Secondary | ICD-10-CM | POA: Diagnosis not present

## 2019-10-22 DIAGNOSIS — Z79899 Other long term (current) drug therapy: Secondary | ICD-10-CM

## 2019-10-22 DIAGNOSIS — C169 Malignant neoplasm of stomach, unspecified: Secondary | ICD-10-CM

## 2019-10-22 DIAGNOSIS — J441 Chronic obstructive pulmonary disease with (acute) exacerbation: Secondary | ICD-10-CM

## 2019-10-22 DIAGNOSIS — F17210 Nicotine dependence, cigarettes, uncomplicated: Secondary | ICD-10-CM

## 2019-10-22 DIAGNOSIS — Z0000 Encounter for general adult medical examination without abnormal findings: Secondary | ICD-10-CM | POA: Insufficient documentation

## 2019-10-22 DIAGNOSIS — J9611 Chronic respiratory failure with hypoxia: Secondary | ICD-10-CM | POA: Diagnosis not present

## 2019-10-22 MED ORDER — BREZTRI AEROSPHERE 160-9-4.8 MCG/ACT IN AERO: 2.0000 | INHALATION_SPRAY | Freq: Two times a day (BID) | RESPIRATORY_TRACT | 0 refills | Status: DC

## 2019-10-22 NOTE — Progress Notes (Signed)
@Patient  ID: Bobby Caldwell, male    DOB: Sep 15, 1955, 64 y.o.   MRN: [id number]  Chief Complaint  Patient presents with   Hospitalization Follow-up    COPD    Referring provider: No ref. provider found  HPI:  64 year old male current smoker followed in our office for advanced COPD and chronic respiratory failure  PMH: Gastric cancer, malnutrition, dysphagia, status post COVID-19 infection December/2020 Smoker/ Smoking History: Current smoker.  Smoking 8 cigarettes a day.  75-pack-year smoking history. Maintenance: Symbicort 160, Spiriva 2.5 Pt of: Dr. Ander Slade  10/22/2019  - Visit   64 year old male current smoker followed in our office for COPD.  He was last seen in January/2021 by Dr. Jenetta Downer.  At that time it was recommended that he stop smoking.  He remain on his inhalers for management of his advanced COPD.  And for his history of gastric cancer that he continue to follow-up with oncology.  Per chart review it appears the patient was seen in the emergency room.  Per chart review appears patient was hospitalized on 10/12/2019 for a COPD exacerbation and acute on chronic respiratory failure.  He was discharged on 10/15/2019.  Discharged home on doxycycline.  Given prednisone taper.  Patient was encouraged to continue to follow-up with oncology.  He was encouraged to stop smoking.  Patient is status post COVID-19 infection in December/2020.  At discharge from the hospital patient was encouraged to follow-up with pulmonary.  He is presenting today for the hospital follow-up.  Patient presenting today with his daughter.  He is still currently smoking.  He is working on reducing this.  He is down to 8 cigarettes a day.  He is occasionally using nicotine patch.  He has not found that any smoking cessation aids are very helpful for him.  He is willing to try "anything".  Patient remains adherent to his Symbicort 160 and Spiriva Respimat 2.5.  It appears to the conversation that occasionally he may get  confused regarding the order of how to take them or how how to take them.  We will further evaluate this today.  He is recently starting to work with physical therapy.  He has noticed that he is gotten weaker.  Patient is planning on traveling to [place] sometime in the future.  He is interested in obtaining a portable oxygen concentrator.  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  He is due for the pneumonia vaccine.  He is also interested to receive the Covid vaccine.  We will discuss this today.  Questionaires / Pulmonary Flowsheets:   MMRC: mMRC Dyspnea Scale mMRC Score  10/22/2019 2    Tests:   10/12/2019-i-STAT-pH 7.3, PCO2 62.8, PO2 32, bicarb 35,  09/11/2019-echocardiogram-LV ejection fraction 60 to 65%, right ventricular systolic function is normal, elevated pulmonary artery pressure at 48.2  11/15/2017-office spirometry-FVC 2.2 (54% predicted), ratio 32, FEV1 0.7 (23% predicted)   FENO:  No results found for: NITRICOXIDE  PFT: No flowsheet data found.  WALK:  SIX MIN WALK 08/05/2019 09/04/2017 03/26/2017 01/10/2016  Supplimental Oxygen during Test? (L/min) No No No No  Tech Comments: - very SOB, could not do another lap pt could not complete the 3rd lap due to legs weak and felt like he would fall.  -    Imaging: CT Head Wo Contrast  Result Date: 10/12/2019 CLINICAL DATA:  Insert follow-up with the. EXAM: CT HEAD WITHOUT CONTRAST TECHNIQUE: Contiguous axial images were obtained from the base of the skull through the vertex  without intravenous contrast. COMPARISON:  None. FINDINGS: Brain: No evidence of acute infarction, hemorrhage, hydrocephalus, extra-axial collection or mass lesion/mass effect. Vascular: No hyperdense vessel or unexpected calcification. Skull: Normal. Negative for fracture or focal lesion. Sinuses/Orbits: No acute finding. Other: None IMPRESSION: No acute intracranial abnormality. Electronically Signed   By: Fidela Salisbury M.D.    On: 10/12/2019 10:47   DG Chest Port 1 View  Result Date: 10/12/2019 CLINICAL DATA:  Weakness and lethargy. History of stomach cancer. EXAM: PORTABLE CHEST 1 VIEW COMPARISON:  September 10, 2019 FINDINGS: Injectable port in stable position. Cardiomediastinal silhouette is normal. Mediastinal contours appear intact. Hyperinflation and emphysematous changes of the lungs. There is no evidence of focal airspace consolidation, pleural effusion or pneumothorax. Osseous structures are without acute abnormality. Soft tissues are grossly normal. IMPRESSION: 1. No active disease in the chest. 2. Emphysematous changes of the lungs. Electronically Signed   By: Fidela Salisbury M.D.   On: 10/12/2019 10:52   VAS [place] ABI WITH/WO TBI  Result Date: 09/30/2019 LOWER EXTREMITY DOPPLER STUDY Indications: Peripheral artery disease. High Risk Factors: Hypertension, current smoker.  Vascular Interventions: 05-08-2009                         Left CIA stent                         Left to right femoro-femoral bypass graft. Comparison Study: 03/04/2019                   RT: 1.00                   LT: 1.01 Performing Technologist: Caralee Ates BA, RVT, RDMS  Examination Guidelines: A complete evaluation includes at minimum, Doppler waveform signals and systolic blood pressure reading at the level of bilateral brachial, anterior tibial, and posterior tibial arteries, when vessel segments are accessible. Bilateral testing is considered an integral part of a complete examination. Photoelectric Plethysmograph (PPG) waveforms and toe systolic pressure readings are included as required and additional duplex testing as needed. Limited examinations for reoccurring indications may be performed as noted.  ABI Findings: +---------+------------------+-----+---------+--------+  Right     Rt Pressure (mmHg) Index Waveform  Comment   +---------+------------------+-----+---------+--------+  Brachial  122                      triphasic            +---------+------------------+-----+---------+--------+  PTA       159                1.23  triphasic           +---------+------------------+-----+---------+--------+  DP        144                1.12  triphasic           +---------+------------------+-----+---------+--------+  Great Toe 95                 0.74  Normal              +---------+------------------+-----+---------+--------+ +---------+------------------+-----+---------+-------+  Left      Lt Pressure (mmHg) Index Waveform  Comment  +---------+------------------+-----+---------+-------+  Brachial  129                      triphasic          +---------+------------------+-----+---------+-------+  PTA       159                1.23  triphasic          +---------+------------------+-----+---------+-------+  DP        149                1.16  triphasic          +---------+------------------+-----+---------+-------+  Great Toe 116                0.90  Normal             +---------+------------------+-----+---------+-------+ +-------+-----------+-----------+------------+------------+  ABI/TBI Today's ABI Today's TBI Previous ABI Previous TBI  +-------+-----------+-----------+------------+------------+  Right   1.23        0.74        1.00         0.79          +-------+-----------+-----------+------------+------------+  Left    1.23        0.90        1.01         1.02          +-------+-----------+-----------+------------+------------+ Bilateral ABIs and TBIs appear essentially unchanged compared to prior study on 03/04/2019.  Summary: Right: Resting right ankle-brachial index is within normal range. No evidence of significant right lower extremity arterial disease. The right toe-brachial index is normal. RT great toe pressure = 95 mmHg. Left: Resting left ankle-brachial index is within normal range. No evidence of significant left lower extremity arterial disease. The left toe-brachial index is normal. LT Great toe pressure = 116 mmHg.  *See table(s) above  for measurements and observations.  Electronically signed by Curt Jews MD on 09/30/2019 at 12:38:12 PM.    Final    VAS US AORTA/IVC/ILIACS  Result Date: 09/30/2019 ABDOMINAL AORTA STUDY Indications: Peripheral Arterial Disease Risk Factors: Hypertension, current smoker. Vascular Interventions: 05-08-2009                         Left CIA stent                         Left to right femoro-femoral bypass graft. Limitations: Air/bowel gas and patient discomfort.  Comparison Study: 03/04/2019:                   Patent left common iliac artery stent with no visualized                   stenosis.                   Patent lef to right FFBPG with increased velocity noted                   (50-74% stenosis) at the proximal anastamosis on the left, no                   plaque visualized and 50-74% stenosis at the right outflow                   segment. Performing Technologist: Caralee Ates BA, RVT, RDMS  Examination Guidelines: A complete evaluation includes B-mode imaging, spectral Doppler, color Doppler, and power Doppler as needed of all accessible portions of each vessel. Bilateral testing is considered an integral part of a complete examination. Limited examinations for reoccurring indications may be performed as noted.  Left Stent(s): +-------------------+--------+---------------+---------+--------+  Common Iliac Artery PSV cm/s Stenosis        Waveform  Comments  +-------------------+--------+---------------+---------+--------+  Prox to Stent       28                       triphasic           +-------------------+--------+---------------+---------+--------+  Proximal Stent      118                      triphasic           +-------------------+--------+---------------+---------+--------+  Mid Stent           204      50-99% stenosis triphasic           +-------------------+--------+---------------+---------+--------+  Distal Stent        195                      triphasic            +-------------------+--------+---------------+---------+--------+  Distal to Stent     203                      triphasic           +-------------------+--------+---------------+---------+--------+ Left Graft #1: +--------------------+--------+---------------+---------+--------+                       PSV cm/s Stenosis        Waveform  Comments  +--------------------+--------+---------------+---------+--------+  Inflow               230                      triphasic           +--------------------+--------+---------------+---------+--------+  Proximal Anastomosis 254                      triphasic           +--------------------+--------+---------------+---------+--------+  Proximal Graft       304      50-70% stenosis triphasic           +--------------------+--------+---------------+---------+--------+  Mid Graft            39                       triphasic           +--------------------+--------+---------------+---------+--------+  Distal Graft         35                       triphasic           +--------------------+--------+---------------+---------+--------+  Distal Anastamosis   60                       triphasic           +--------------------+--------+---------------+---------+--------+  Outflow              75                       triphasic           +--------------------+--------+---------------+---------+--------+  Summary: Abdominal Aorta: Widely patent left to right femoro-femoral bypass graft. Elevated velocities noted at the proximal anastamosis and proximal graft suggesting a stenosis of 50-70%. Unable to duplicate elevated velocities noted  at the distal anastamosis and the outflow artery as noted on prior exam dated 03/04/2019. Thrombus noted within the distal aorta. Patent left common iliac artery stent. Elevated velocities at the mid stent suggest a stenosis of 50-99%. Velocities are at the low end of stenosis category range. Stenosis: +-----------------+---------------+  Location           Stent            +-----------------+---------------+  Left Common Iliac 50-99% stenosis  +-----------------+---------------+   *See table(s) above for measurements and observations.  Electronically signed by Curt Jews MD on 09/30/2019 at 12:38:01 PM.    Final     Lab Results:  CBC    Component Value Date/Time   WBC 7.4 10/15/2019 0254   RBC 4.49 10/15/2019 0254   HGB 12.3 (L) 10/15/2019 0254   HGB 12.9 (L) 08/01/2019 0954   HGB 15.0 07/11/2017 0848   HCT 38.7 (L) 10/15/2019 0254   HCT 43.7 07/11/2017 0848   PLT 235 10/15/2019 0254   PLT 203 08/01/2019 0954   PLT 165 07/11/2017 0848   MCV 86.2 10/15/2019 0254   MCV 86.7 07/11/2017 0848   MCH 27.4 10/15/2019 0254   MCHC 31.8 10/15/2019 0254   RDW 13.6 10/15/2019 0254   RDW 12.8 07/11/2017 0848   LYMPHSABS 1.2 10/12/2019 1007   LYMPHSABS 1.4 07/11/2017 0848   MONOABS 0.6 10/12/2019 1007   MONOABS 0.6 07/11/2017 0848   EOSABS 0.0 10/12/2019 1007   EOSABS 0.2 07/11/2017 0848   BASOSABS 0.1 10/12/2019 1007   BASOSABS 0.1 07/11/2017 0848    BMET    Component Value Date/Time   NA 140 10/15/2019 0254   NA 139 07/11/2017 0848   K 4.3 10/15/2019 0254   K 3.5 07/11/2017 0848   CL 94 (L) 10/15/2019 0254   CO2 39 (H) 10/15/2019 0254   CO2 32 (H) 07/11/2017 0848   GLUCOSE 144 (H) 10/15/2019 0254   GLUCOSE 109 07/11/2017 0848   BUN 12 10/15/2019 0254   BUN 9.8 07/11/2017 0848   CREATININE 0.44 (L) 10/15/2019 0254   CREATININE 0.60 (L) 08/01/2019 0954   CREATININE 0.7 07/11/2017 0848   CALCIUM 8.1 (L) 10/15/2019 0254   CALCIUM 9.0 07/11/2017 0848   GFRNONAA >60 10/15/2019 0254   GFRNONAA >60 08/01/2019 0954   GFRAA >60 10/15/2019 0254   GFRAA >60 08/01/2019 0954    BNP    Component Value Date/Time   BNP 422.6 (H) 09/10/2019 1241    ProBNP    Component Value Date/Time   PROBNP 25.0 03/26/2017 1040    Specialty Problems      Pulmonary Problems   COPD (chronic obstructive pulmonary disease) with emphysema (HCC)     DOE (dyspnea on exertion)   Acute on chronic respiratory failure with hypoxia (HCC)   Pneumonia   Chronic hypoxemic respiratory failure (HCC)   Acute respiratory failure with hypoxia and hypercapnia (HCC)   COPD with acute exacerbation (HCC)   Acute respiratory failure with hypoxia (HCC)   Acute on chronic respiratory failure with hypoxia and hypercapnia (HCC)      No Known Allergies  Immunization History  Administered Date(s) Administered   Influenza Split 03/11/2015   Influenza,inj,Quad PF,6+ Mos 04/04/2018   Pneumonia23  Patient is also interested in receiving the Covid vaccine  Past Medical History:  Diagnosis Date   COPD (chronic obstructive pulmonary disease) ([place])    Dyspnea    PAD (peripheral artery disease) ([place])    Stomach cancer ([place])  Tobacco abuse    Weight loss     Tobacco History: Social History   Tobacco Use  Smoking Status Current Every Day Smoker   Packs/day: 1.50   Years: 50.00   Pack years: 75.00   Types: Cigarettes   Last attempt to quit: 11/2017   Years since quitting: 1.9  Smokeless Tobacco Never Used  Tobacco Comment   8 cigs per day 08/05/19   Ready to quit: No Counseling given: Yes Comment: 8 cigs per day 08/05/19  Smoking assessment and cessation counseling  Patient currently smoking: 8 cigarettes a day I have advised the patient to quit/stop smoking as soon as possible due to high risk for multiple medical problems.  It will also be very difficult for [place] to manage patient's  respiratory symptoms and status if we continue to expose her lungs to a known irritant.  We do not advise e-cigarettes as a form of stopping smoking.  Patient is willing to quit smoking.  He is working on reducing his smoking by reduced to quit method.  He occasionally uses a nicotine patch.  He is willing to meet with our pharmacy team to discuss smoking cessation.  He is aware that it is extremely important that he work to reduce his  smoking.  I have advised the patient that we can assist and have options of nicotine replacement therapy, provided smoking cessation education today, provided smoking cessation counseling, and provided cessation resources.  Follow-up next office visit office visit for assessment of smoking cessation.    Smoking cessation counseling advised for: 5 min    Outpatient Encounter Medications as of 10/22/2019  Medication Sig   Amino Acids-Protein Hydrolys (FEEDING SUPPLEMENT, PRO-STAT SUGAR FREE 64,) LIQD Take 30 mLs by mouth 2 (two) times daily.   budesonide-formoterol (SYMBICORT) 160-4.5 MCG/ACT inhaler Inhale 2 puffs into the lungs 2 (two) times daily.   feeding supplement, ENSURE ENLIVE, (ENSURE ENLIVE) LIQD Take 237 mLs by mouth 2 (two) times daily between meals.   ipratropium-albuterol (DUONEB) 0.5-2.5 (3) MG/3ML SOLN Inhale 3 mLs into the lungs 4 (four) times daily. Dx:J44.9   megestrol (MEGACE) 40 MG/ML suspension Take 200 mg by mouth 4 (four) times daily.   mirtazapine (REMERON) 15 MG tablet Take 1 tablet (15 mg total) by mouth at bedtime.   Multiple Vitamin (MULTIVITAMIN WITH MINERALS) TABS tablet Take 1 tablet by mouth daily.   OXYGEN Inhale 2 L into the lungs continuous.   Tiotropium Bromide Monohydrate (SPIRIVA RESPIMAT) 2.5 MCG/ACT AERS Inhale 2 puffs into the lungs daily.   Budeson-Glycopyrrol-Formoterol (BREZTRI AEROSPHERE) 160-9-4.8 MCG/ACT AERO Inhale 2 puffs into the lungs 2 (two) times daily.   [DISCONTINUED] predniSONE (DELTASONE) 10 MG tablet Take 4 tabs for 3 days, then 3 tabs for 3 days, then 2 tabs for 3 days, then 1 tab for 3 days, then 1/2 tab for 4 days.   No facility-administered encounter medications on file as of 10/22/2019.     Review of Systems  Review of Systems  Constitutional: Positive for fatigue. Negative for activity change, chills, fever and unexpected weight change.  HENT: Negative for postnasal drip, rhinorrhea, sinus pressure, sinus pain  and sore throat.   Eyes: Negative.   Respiratory: Positive for shortness of breath. Negative for cough and wheezing.   Cardiovascular: Negative for chest pain, palpitations and leg swelling.  Gastrointestinal: Negative for nausea and vomiting.  Endocrine: Negative.   Genitourinary: Negative.   Musculoskeletal: Positive for gait problem.  Skin: Negative.   Neurological: Positive for weakness.  Negative for dizziness and headaches.  Psychiatric/Behavioral: Negative.  Negative for dysphoric mood. The patient is not nervous/anxious.   All other systems reviewed and are negative.    Physical Exam  BP 118/68    Pulse 90    Temp 98.1 F (36.7 C) (Temporal)    Ht 5\' 5"  (1.651 m)    Wt 97 lb 12.8 oz (44.4 kg)    SpO2 95% Comment: on RA   BMI 16.27 kg/m   Wt Readings from Last 5 Encounters:  10/22/19 97 lb 12.8 oz (44.4 kg)  10/12/19 100 lb 8.5 oz (45.6 kg)  09/30/19 100 lb (45.4 kg)  09/10/19 98 lb 12.3 oz (44.8 kg)  08/22/19 101 lb 3.2 oz (45.9 kg)    BMI Readings from Last 5 Encounters:  10/22/19 16.27 kg/m  10/12/19 16.73 kg/m  09/30/19 16.64 kg/m  09/10/19 16.44 kg/m  08/22/19 16.84 kg/m     Physical Exam Vitals and nursing note reviewed.  Constitutional:      General: He is not in acute distress.    Appearance: Normal appearance. He is normal weight.     Comments: Frail thin deconditioned elderly male  HENT:     Head: Normocephalic and atraumatic.     Right Ear: Hearing and external ear normal.     Left Ear: Hearing and external ear normal.     Nose: Nose normal. No mucosal edema or rhinorrhea.     Right Turbinates: Not enlarged.     Left Turbinates: Not enlarged.     Mouth/Throat:     Mouth: Mucous membranes are dry.     Pharynx: Oropharynx is clear. No oropharyngeal exudate.  Eyes:     Pupils: Pupils are equal, round, and reactive to light.  Cardiovascular:     Rate and Rhythm: Normal rate and regular rhythm.     Pulses: Normal pulses.     Heart sounds:  Normal heart sounds. No murmur.  Pulmonary:     Effort: Pulmonary effort is normal.     Breath sounds: No decreased breath sounds, wheezing or rales.     Comments: Diminished breath sounds that exam, no audible wheezing or rhonchi Abdominal:     General: Bowel sounds are normal. There is no distension.     Palpations: Abdomen is soft.     Tenderness: There is no abdominal tenderness.  Musculoskeletal:     Cervical back: Normal range of motion.     Right lower leg: No edema.     Left lower leg: No edema.     Comments: Physically deconditioned, in wheelchair  Lymphadenopathy:     Cervical: No cervical adenopathy.  Skin:    General: Skin is warm and dry.     Capillary Refill: Capillary refill takes less than 2 seconds.     Findings: No erythema or rash.  Neurological:     General: No focal deficit present.     Mental Status: He is alert and oriented to person, place, and time.     Motor: No weakness.     Coordination: Coordination normal.     Gait: Gait is intact. Gait normal.  Psychiatric:        Mood and Affect: Mood normal.        Behavior: Behavior normal. Behavior is cooperative.        Thought Content: Thought content normal.        Judgment: Judgment normal.       Assessment & Plan:   Chronic hypoxemic respiratory  failure [facility]) Plan: We will plan on walking patient for portable oxygen concentrator when our office receives our POC from the [place] Patient aware that he will need a walk to assess oxygen needs as well as settings  COPD (chronic obstructive pulmonary disease) with emphysema (HCC) Prone to COPD exacerbation Current smoker, working on reducing cigarettes Due for Pneumovax 23 Due for Covid vaccine Recently discharged for with COPD exacerbation Frail deconditioned state High risk for confusion with inhaler adherence and medications  Plan: Trial of Breztri  Hold Symbicort 160 Hold Spiriva Respimat 2.5 4-week follow-up with clinical pharmacy team  as well as our office Emphasized importance of stopping smoking Scheduled patient for Covid vaccine Patient needs to receive Pneumovax 23 2 weeks after last Covid shot Continue to work with physical therapy     COPD with acute exacerbation ([place]) Appears resolved today  Gastric adenocarcinoma ([place]) Plan: Keep follow-up with oncology  Cigarette smoker Plan: Continue to work on reducing her smoking 4-week follow-up with clinical pharmacy team to further assess smoking cessation Recommend nicotine replacement therapies at this time Patient will need Pneumovax 23 2 weeks after last Covid shot   History of COVID-19 Plan: Continue to clinically monitor Receive Covid shot  Medication management High risk for medication noncompliance High risk for inhaler misuse  Discussion: We will try to simplify patient's inhaler regimen to help improve adherence  Plan: Trial of Breztri  Hold Symbicort 160 Hold Spiriva Respimat 2.5 4-week follow-up with clinical pharmacy team in our office  Healthcare maintenance Scheduled patient for Covid vaccine today Scheduled spouse for Covid vaccine today Patient educated on need for Pneumovax 23, will delay this and administer 2 weeks after 2nd Covid shot    Return in about 4 weeks (around 11/19/2019), or if symptoms worsen or fail to improve, for Follow up with Wyn Quaker FNP-C.   Lauraine Rinne, NP 10/22/2019   This appointment required 42 minutes of patient care (this includes precharting, chart review, review of results, face-to-face care, etc.).

## 2019-10-22 NOTE — Assessment & Plan Note (Signed)
Plan: Continue to work on reducing her smoking 4-week follow-up with clinical pharmacy team to further assess smoking cessation Recommend nicotine replacement therapies at this time Patient will need Pneumovax 23 2 weeks after last Covid shot

## 2019-10-22 NOTE — Assessment & Plan Note (Signed)
High risk for medication noncompliance High risk for inhaler misuse  Discussion: We will try to simplify patient's inhaler regimen to help improve adherence  Plan: Trial of Breztri  Hold Symbicort 160 Hold Spiriva Respimat 2.5 4-week follow-up with clinical pharmacy team in our office

## 2019-10-22 NOTE — Assessment & Plan Note (Signed)
Plan: Continue to clinically monitor Receive Covid shot

## 2019-10-22 NOTE — Assessment & Plan Note (Signed)
Appears resolved today

## 2019-10-22 NOTE — Telephone Encounter (Signed)
I called Bobby Caldwell and advised her that AO approved PT and Nursing. Nothing further is needed.

## 2019-10-22 NOTE — Telephone Encounter (Signed)
Okay to place order for this? 

## 2019-10-22 NOTE — Assessment & Plan Note (Signed)
Plan:  Keep follow up with oncology 

## 2019-10-22 NOTE — Assessment & Plan Note (Signed)
Scheduled patient for Covid vaccine today Scheduled spouse for Covid vaccine today Patient educated on need for Pneumovax 23, will delay this and administer 2 weeks after 2nd Covid shot

## 2019-10-22 NOTE — Telephone Encounter (Signed)
Yesterday we received a call from [facility] from [place] asking if Dr. Burr Medico would be the attending for Mr Bobby Caldwell's care.  I reviewed this with Dr. Burr Medico.  She has declined.  She sees him annually and his current health issues are unrelated to his cancer diagnosis.  I spoke with [place] in [place] and relayed this information.

## 2019-10-22 NOTE — Assessment & Plan Note (Signed)
Plan: We will plan on walking patient for portable oxygen concentrator when our office receives our POC from the [place] Patient aware that he will need a walk to assess oxygen needs as well as settings

## 2019-10-22 NOTE — Patient Instructions (Addendum)
You were seen today by Lauraine Rinne, NP  for:  1. Centrilobular emphysema (HCC)  Trial of Breztri >>> 2 puffs in the morning right when you wake up, rinse out your mouth after use, 12 hours later 2 puffs, rinse after use >>> Take this daily, no matter what >>> This is not a rescue inhaler   USE SPACER   Hold Symbicort and Spiriva when taking Breztri  Note your daily symptoms > remember "red flags" for COPD:   >>>Increase in cough >>>increase in sputum production >>>increase in shortness of breath or activity  intolerance.   If you notice these symptoms, please call the office to be seen.    2. Chronic hypoxemic respiratory failure (HCC)  Continue oxygen therapy as prescribed  >>>maintain oxygen saturations greater than 88 percent  >>>if unable to maintain oxygen saturations please contact the office  >>>do not smoke with oxygen  >>>can use nasal saline gel or nasal saline rinses to moisturize nose if oxygen causes dryness   3. History of COVID-19  4. Cigarette smoker  We recommend that you stop smoking.  >>>You need to set a quit date >>>If you have friends or family who smoke, let them know you are trying to quit and not to smoke around you or in your living environment  Smoking Cessation Resources:  1 800 QUIT NOW  >>> Patient to call this resource and utilize it to help support her quit smoking >>> Keep up your hard work with stopping smoking  You can also contact the [facility] >>>For smoking cessation classes call [phone]  We do not recommend using e-cigarettes as a form of stopping smoking  You can sign up for smoking cessation support texts and information:  >>>https://smokefree.gov/smokefreetxt   5. Gastric adenocarcinoma ([place])  Follow-up with oncology  6. Medication management  Please present to our office in 2-4 weeks for an appointment with the clinical pharmacy team for:  . Smoking cessation . Medication Management   . Medication reconciliation  . Medication Access  . Inhaler teaching   7. Healthcare maintenance   Get Covid vaccine as scheduled.   Follow Up:    Return in about 4 weeks (around 11/19/2019), or if symptoms worsen or fail to improve, for Follow up with Wyn Quaker FNP-C.  Please present to our office in 2-4 weeks for an appointment with the clinical pharmacy team for:  . Smoking cessation . Medication Management  . Medication reconciliation  . Medication Access  . Inhaler teaching   Please do your part to reduce the spread of COVID-19:      Reduce your risk of any infection  and COVID19 by using the similar precautions used for avoiding the common cold or flu:  Marland Kitchen Wash your hands often with soap and warm water for at least 20 seconds.  If soap and water are not readily available, use an alcohol-based hand sanitizer with at least 60% alcohol.  . If coughing or sneezing, cover your mouth and nose by coughing or sneezing into the elbow areas of your shirt or coat, into a tissue or into your sleeve (not your hands). Langley Gauss A MASK when in public  . Avoid shaking hands with others and consider head nods or verbal greetings only. . Avoid touching your eyes, nose, or mouth with unwashed hands.  . Avoid close contact with people who are sick. . Avoid places or events with large numbers of people in one location, like concerts or sporting events. Marland Kitchen  If you have some symptoms but not all symptoms, continue to monitor at home and seek medical attention if your symptoms worsen. . If you are having a medical emergency, call 911.   [place] / e-Visit: [email or web address]         [facility]: [facility]: [id number]                   [facility]: [id number]     It is flu season:   >>> Best ways to protect herself from the flu:  Receive the yearly flu vaccine, practice good hand hygiene washing with soap and also using hand sanitizer when available, eat a nutritious meals, get adequate rest, hydrate appropriately   Please contact the office if your symptoms worsen or you have concerns that you are not improving.   Thank you for choosing [facility] for your healthcare, and for allowing [place] to partner with you on your healthcare journey. I am thankful to be able to provide care to you today.   Wyn Quaker FNP-C

## 2019-10-22 NOTE — Assessment & Plan Note (Signed)
Prone to COPD exacerbation Current smoker, working on reducing cigarettes Due for Pneumovax 23 Due for Covid vaccine Recently discharged for with COPD exacerbation Frail deconditioned state High risk for confusion with inhaler adherence and medications  Plan: Trial of Breztri  Hold Symbicort 160 Hold Spiriva Respimat 2.5 4-week follow-up with clinical pharmacy team as well as our office Emphasized importance of stopping smoking Scheduled patient for Covid vaccine Patient needs to receive Pneumovax 23 2 weeks after last Covid shot Continue to work with physical therapy

## 2019-10-23 ENCOUNTER — Ambulatory Visit: Payer: Medicare Other | Attending: Internal Medicine

## 2019-10-23 DIAGNOSIS — Z23 Encounter for immunization: Secondary | ICD-10-CM

## 2019-10-23 NOTE — Progress Notes (Signed)
   Covid-19 Vaccination Clinic  Name:  Bobby Caldwell    MRN: [id number] DOB: [birth year]  10/23/2019  [name] was observed post Covid-19 immunization for 15 minutes without incident. He was provided with Vaccine Information Sheet and instruction to access the V-Safe system.   [name] was instructed to call 911 with any severe reactions post vaccine: Marland Kitchen Difficulty breathing  . Swelling of face and throat  . A fast heartbeat  . A bad rash all over body  . Dizziness and weakness   Immunizations Administered    Name Date Dose VIS Date Route   Pfizer COVID-19 Vaccine 10/23/2019  8:15 AM 0.3 mL 06/20/2019 Intramuscular   Manufacturer: [place]   Lot: [id number]   [place]: [id number]

## 2019-10-27 ENCOUNTER — Telehealth: Payer: Self-pay | Admitting: Pulmonary Disease

## 2019-10-27 DIAGNOSIS — J432 Centrilobular emphysema: Secondary | ICD-10-CM

## 2019-10-27 NOTE — Telephone Encounter (Signed)
Dr Jenetta Downer, please advise if you are okay to give VO to continue OT, thanks  Aaron Edelman had mentioned continuing the PT at last visit 4/14

## 2019-11-04 NOTE — Telephone Encounter (Signed)
Dr. Jenetta Downer please advise on this message

## 2019-11-05 NOTE — Telephone Encounter (Signed)
Continue OT

## 2019-11-06 NOTE — Telephone Encounter (Signed)
Orders placed for OT also called Manuela Schwartz at [phone] and left voicemail letting her know it has been done. Nothing further needed at this time.

## 2019-11-10 ENCOUNTER — Telehealth: Payer: Self-pay | Admitting: Pulmonary Disease

## 2019-11-10 NOTE — Telephone Encounter (Signed)
Called spoke with Claiborne Billings RN w/ [facility]  Claiborne Billings is asking for orders for: 1. Zofran.  Patient is only able to eat small amounts of soup and gets very nauseated with this small amount of food. 2. Incentive spirometer 3. Is patient supposed to be on continuous O2 or PRN?  Every time she checks his sats he's in the mid-80's at rest on room air.  She does apply O2 but reports patient is very reluctant to wear.   Dr Ander Slade please advise on the above, thank you.

## 2019-11-10 NOTE — Telephone Encounter (Addendum)
I called Claiborne Billings but there was no answer. LM to have her call back.   In the meanwhile will ask Dr. Ander Slade if he would be ok sending in Zofran. Please advise.

## 2019-11-10 NOTE — Telephone Encounter (Signed)
Claiborne Billings returning call - she can be reached at [phone] -pr

## 2019-11-11 ENCOUNTER — Other Ambulatory Visit: Payer: Self-pay

## 2019-11-11 ENCOUNTER — Ambulatory Visit (INDEPENDENT_AMBULATORY_CARE_PROVIDER_SITE_OTHER): Payer: Medicare Other | Admitting: Pulmonary Disease

## 2019-11-11 ENCOUNTER — Encounter: Payer: Self-pay | Admitting: Pulmonary Disease

## 2019-11-11 ENCOUNTER — Telehealth: Payer: Self-pay | Admitting: Pulmonary Disease

## 2019-11-11 DIAGNOSIS — F17210 Nicotine dependence, cigarettes, uncomplicated: Secondary | ICD-10-CM

## 2019-11-11 DIAGNOSIS — B029 Zoster without complications: Secondary | ICD-10-CM | POA: Diagnosis not present

## 2019-11-11 DIAGNOSIS — J9611 Chronic respiratory failure with hypoxia: Secondary | ICD-10-CM | POA: Diagnosis not present

## 2019-11-11 MED ORDER — AMITRIPTYLINE HCL 10 MG PO TABS: 10.0000 mg | ORAL_TABLET | Freq: Every day | ORAL | 0 refills | Status: DC

## 2019-11-11 MED ORDER — OXYCODONE-ACETAMINOPHEN 7.5-325 MG PO TABS: 1.0000 | ORAL_TABLET | Freq: Four times a day (QID) | ORAL | 0 refills | Status: DC | PRN

## 2019-11-11 NOTE — Patient Instructions (Signed)
Shingles  Most of the lesions already dried off, its been 3 weeks since it started  Low-dose Elavil-do not want to increase the dose because you are on antidepressants that may react with this  Percocet for pain and discomfort   Call with significant concerns  You may still be best served by registering with a primary care provider to help with all your other health concerns

## 2019-11-11 NOTE — Telephone Encounter (Signed)
Called and spoke with Patent attorney at [facility].  Cira Rue stated nothing is needed, question was answered.

## 2019-11-11 NOTE — Progress Notes (Signed)
Bobby Caldwell    [id number]    May 27, 1956  Primary Care Physician:Patient, No Pcp Per  Referring Physician: No referring provider defined for this encounter.  Chief complaint:   Patient being seen for follow-up for obstructive lung disease Shingles infection  HPI: Shingles left rib cage -Has been present for over 3 weeks -Mostly encrusted -Still with significant pain and discomfort  History of stomach cancer Decreased appetite Cough with shortness of breath Still actively smoking  Has been using oxygen around-the-clock  Has severe COPD/emphysema  Most significant concern today was worsening depression  Decreased appetite Decreased activity levels  Started smoking at age 62, greater than 80-pack-year smoking history currently smoking about half a pack per day Was able to quit for a while but currently back smoking actively  He feels generally well, states he is under a lot of stress and this is contributing to him continuing to smoke actively  He has a diagnosis of adenocarcinoma of the stomach for which he is currently on chemotherapy Partial gastrectomy 11/27/2017-stage I disease Significant history of peripheral arterial disease  Outpatient Encounter Medications as of 11/11/2019  Medication Sig  . Amino Acids-Protein Hydrolys (FEEDING SUPPLEMENT, PRO-STAT SUGAR FREE 64,) LIQD Take 30 mLs by mouth 2 (two) times daily.  . Budeson-Glycopyrrol-Formoterol (BREZTRI AEROSPHERE) 160-9-4.8 MCG/ACT AERO Inhale 2 puffs into the lungs 2 (two) times daily.  . budesonide-formoterol (SYMBICORT) 160-4.5 MCG/ACT inhaler Inhale 2 puffs into the lungs 2 (two) times daily.  . feeding supplement, ENSURE ENLIVE, (ENSURE ENLIVE) LIQD Take 237 mLs by mouth 2 (two) times daily between meals.  Marland Kitchen ipratropium-albuterol (DUONEB) 0.5-2.5 (3) MG/3ML SOLN Inhale 3 mLs into the lungs 4 (four) times daily. Dx:J44.9  . megestrol (MEGACE) 40 MG/ML suspension Take 200 mg by mouth 4 (four) times  daily.  . mirtazapine (REMERON) 15 MG tablet Take 1 tablet (15 mg total) by mouth at bedtime.  . Multiple Vitamin (MULTIVITAMIN WITH MINERALS) TABS tablet Take 1 tablet by mouth daily.  . OXYGEN Inhale 2 L into the lungs continuous.  . Tiotropium Bromide Monohydrate (SPIRIVA RESPIMAT) 2.5 MCG/ACT AERS Inhale 2 puffs into the lungs daily.  Marland Kitchen amitriptyline (ELAVIL) 10 MG tablet Take 1 tablet (10 mg total) by mouth at bedtime.  Marland Kitchen oxyCODONE-acetaminophen (PERCOCET) 7.5-325 MG tablet Take 1 tablet by mouth every 6 (six) hours as needed for severe pain.   No facility-administered encounter medications on file as of 11/11/2019.    Allergies as of 11/11/2019  . (No Known Allergies)    Past Medical History:  Diagnosis Date  . COPD (chronic obstructive pulmonary disease) ([place])   . Dyspnea   . PAD (peripheral artery disease) ([place])   . Stomach cancer ([place])   . Tobacco abuse   . Weight loss     Past Surgical History:  Procedure Laterality Date  . ABDOMINAL AORTOGRAM W/LOWER EXTREMITY N/A 05/03/2017   Procedure: ABDOMINAL AORTOGRAM W/LOWER EXTREMITY;  Surgeon: Conrad Broughton, MD;  Location: [place] [facility];  Service: Cardiovascular;  Laterality: N/A;  . BIOPSY  08/22/2018   Procedure: BIOPSY;  Surgeon: [name], MD;  Location: [facility];  Service: Endoscopy;;  . BIOPSY  07/31/2019   Procedure: BIOPSY;  Surgeon: [name], MD;  [facility];  Service: Endoscopy;;  . ESOPHAGOGASTRODUODENOSCOPY     05/24/17  . ESOPHAGOGASTRODUODENOSCOPY (EGD) WITH PROPOFOL N/A 05/24/2017   Procedure: ESOPHAGOGASTRODUODENOSCOPY (EGD) WITH PROPOFOL;  Surgeon: [name], MD;  [facility];  Service: Endoscopy;  Laterality: N/A;  . ESOPHAGOGASTRODUODENOSCOPY (EGD) WITH PROPOFOL N/A 08/22/2018   Procedure: ESOPHAGOGASTRODUODENOSCOPY (EGD) WITH PROPOFOL;  Surgeon: [name], MD;  [facility];  Service: Endoscopy;  Laterality: N/A;  .  ESOPHAGOGASTRODUODENOSCOPY (EGD) WITH PROPOFOL N/A 11/28/2018   Procedure: ESOPHAGOGASTRODUODENOSCOPY (EGD) WITH PROPOFOL;  Surgeon: [name], MD;  [facility];  Service: Endoscopy;  Laterality: N/A;  . ESOPHAGOGASTRODUODENOSCOPY (EGD) WITH PROPOFOL N/A 07/31/2019   Procedure: ESOPHAGOGASTRODUODENOSCOPY (EGD) WITH PROPOFOL;  Surgeon: [name], MD;  [facility];  Service: Endoscopy;  Laterality: N/A;  . EUS N/A 06/07/2017   Procedure: UPPER ENDOSCOPIC ULTRASOUND (EUS) RADIAL;  Surgeon: [name], MD;  [facility];  Service: Endoscopy;  Laterality: N/A;  . FEMORAL-FEMORAL BYPASS GRAFT Bilateral 05/08/2017   Procedure: LEFT TO RIGHT BYPASS GRAFT FEMORAL-FEMORAL ARTERY;  Surgeon: Conrad Glendora, MD;  Location: [place];  Service: Vascular;  Laterality: Bilateral;  . IR FLUORO GUIDE PORT INSERTION RIGHT  07/05/2017  . IR US GUIDE VASC ACCESS RIGHT  07/05/2017  . LAPAROSCOPIC INSERTION GASTROSTOMY TUBE N/A 11/27/2017   Procedure: JEJUNOSTOMY TUBE PLACEMENT;  Surgeon: [name], MD;  Location: [place];  Service: General;  Laterality: N/A;  . LAPAROSCOPIC PARTIAL GASTRECTOMY N/A 11/27/2017   Procedure: PARTIAL GASTRECTOMY;  Surgeon: [name], MD;  Location: [place];  Service: General;  Laterality: N/A;  . LAPAROSCOPY N/A 11/27/2017   Procedure: LAPAROSCOPY DIAGNOSTIC;  Surgeon: [name], MD;  Location: [place];  Service: General;  Laterality: N/A;  . NM MYOVIEW LTD  06/2017   LOW RISK. Small basal inferoseptal defect thought to be related to artifact (although cannot exclude prior infarct). Normal wall motion i in this area would suggest artifact not infarct. Otherwise no ischemia or infarction.  Marland Kitchen PERIPHERAL VASCULAR INTERVENTION Left 05/03/2017   Procedure: PERIPHERAL VASCULAR INTERVENTION;  Surgeon: Conrad Niobrara, MD;  Location: [place] [facility];  Service: Cardiovascular;  Laterality: Left;  common iliac  . POLYPECTOMY  11/28/2018   Procedure:  POLYPECTOMY;  Surgeon: [name], MD;  [facility];  Service: Endoscopy;;  . TRANSTHORACIC ECHOCARDIOGRAM  06/26/2017   Normal LV size and function. EF 60-65%. No wall motion abnormalities. GR 1 DD. Mild LA dilation    Family History  Family history unknown: Yes    Social History   Socioeconomic History  . Marital status: Married    Spouse name: Not on file  . Number of children: 4  . Years of education: Not on file  . Highest education level: Not on file  Occupational History  . Not on file  Tobacco Use  . Smoking status: Current Every Day Smoker    Packs/day: 1.50    Years: 50.00    Pack years: 75.00    Types: Cigarettes    Last attempt to quit: 11/2017    Years since quitting: 2.0  . Smokeless tobacco: Never Used  . Tobacco comment: 8 cigs per day 08/05/19  Substance and Sexual Activity  . Alcohol use: No    Alcohol/week: 0.0 standard drinks  . Drug use: No  . Sexual activity: Yes  Other Topics Concern  . Not on file  Social History Narrative  . Not on file   Social Determinants of Health   Financial Resource Strain:   . Difficulty of Paying Living Expenses:   Food Insecurity:   . Worried About Charity fundraiser in the Last Year:   . Arboriculturist in  the Last Year:   Transportation Needs:   . Film/video editor (Medical):   Marland Kitchen Lack of Transportation (Non-Medical):   Physical Activity:   . Days of Exercise per Week:   . Minutes of Exercise per Session:   Stress:   . Feeling of Stress :   Social Connections:   . Frequency of Communication with Friends and Family:   . Frequency of Social Gatherings with Friends and Family:   . Attends Religious Services:   . Active Member of Clubs or Organizations:   . Attends Archivist Meetings:   Marland Kitchen Marital Status:   Intimate Partner Violence:   . Fear of Current or Ex-Partner:   . Emotionally Abused:   Marland Kitchen Physically Abused:   . Sexually Abused:     Review of Systems    Constitutional: Positive for appetite change. Negative for fatigue.  HENT: Negative.   Eyes: Negative.   Respiratory: Positive for shortness of breath. Negative for cough, choking and chest tightness.   Cardiovascular: Negative.   Gastrointestinal: Negative.   Endocrine: Negative.   Psychiatric/Behavioral:       Depression    Vitals:   11/11/19 1407  SpO2: (!) 87%     Physical Exam  Constitutional:  Underweight  HENT:  Head: Normocephalic and atraumatic.  Eyes: Pupils are equal, round, and reactive to light. Conjunctivae and EOM are normal. Right eye exhibits no discharge. Left eye exhibits no discharge.  Neck: No tracheal deviation present. No thyromegaly present.  Cardiovascular: Normal rate and regular rhythm.  Pulmonary/Chest: Effort normal and breath sounds normal. No respiratory distress. He has no wheezes. He has no rales.  Abdominal: Soft.  Musculoskeletal:     Cervical back: Normal range of motion and neck supple.  Skin:  Left rib cage shingles all around to the back, encrusted   Data Reviewed:  Hospital records reviewed Recent labs reviewed   Assessment:  .  Advanced COPD -Continues to smoke actively -Not very compliant with inhalers  Shingles -Past a stage where antivirals may help -Has been present for about 3 weeks with most of the lesions and crusted -Still limited with pain and discomfort  .  Active smoker -Continues to smoke actively although says he is cutting down  .  History of gastric cancer -Continues to follow-up with oncology on a regular basis  .  Referral arterial disease .  Depression .  Anorexia   Plan/Recommendations: .  Continue current medications .  Continue bronchodilator treatment .  Percocet every 6 for pain and discomfort .  Low-dose Elavil 10 mg to be added for neuritis .  Continue Spiriva-prescription and refill sent to pharmacy .  Continue Symbicort .  Continue oxygen supplementation  I will see him back in  the office in about 4 to 6 weeks  Sherrilyn Rist MD [facility] 11/11/2019, 2:39 PM  CC: No ref. provider found

## 2019-11-12 ENCOUNTER — Other Ambulatory Visit: Payer: Self-pay | Admitting: Pulmonary Disease

## 2019-11-12 MED ORDER — ONDANSETRON HCL 4 MG PO TABS: 4.0000 mg | ORAL_TABLET | Freq: Three times a day (TID) | ORAL | 0 refills | Status: DC | PRN

## 2019-11-12 NOTE — Telephone Encounter (Signed)
Okay to have an order for Zofran  Okay with incentive spirometer-kindly order  He should be on continuous oxygen   The patient should have a primary care doctor, we can assist with his pulmonary issues

## 2019-11-12 NOTE — Telephone Encounter (Signed)
lmtcb X1 to make Claiborne Billings at [facility] aware of recs.   Will send in Zofran after verifying pharmacy.

## 2019-11-12 NOTE — Telephone Encounter (Signed)
Received return call Claiborne Billings, [place].  Dr. Judson Roch recommendations given.  Understanding stated. Claiborne Billings stated she has a IS that she will take to Patient at next home visit, and instruct him to use.  Zofran sent to pharmacy by Dr.Olalere.  Nothing further at this time.

## 2019-11-17 ENCOUNTER — Ambulatory Visit: Payer: Medicare Other | Attending: Internal Medicine

## 2019-11-17 DIAGNOSIS — Z23 Encounter for immunization: Secondary | ICD-10-CM

## 2019-11-17 NOTE — Progress Notes (Signed)
   Covid-19 Vaccination Clinic  Name:  Bobby Caldwell    MRN: [id number] DOB: 02-24-1956  11/17/2019  Bobby Caldwell was observed post Covid-19 immunization for 15 minutes without incident. He was provided with Vaccine Information Sheet and instruction to access the V-Safe system.   Bobby Caldwell was instructed to call 911 with any severe reactions post vaccine: Marland Kitchen Difficulty breathing  . Swelling of face and throat  . A fast heartbeat  . A bad rash all over body  . Dizziness and weakness   Immunizations Administered    Name Date Dose VIS Date Route   Pfizer COVID-19 Vaccine 11/17/2019  8:38 AM 0.3 mL 09/03/2018 Intramuscular   Manufacturer: [place]   Lot: [id number]   [place]: [id number]

## 2019-11-18 ENCOUNTER — Observation Stay (HOSPITAL_COMMUNITY): Payer: Medicare Other

## 2019-11-18 ENCOUNTER — Other Ambulatory Visit: Payer: Self-pay

## 2019-11-18 ENCOUNTER — Inpatient Hospital Stay (HOSPITAL_COMMUNITY)
Admission: EM | Admit: 2019-11-18 | Discharge: 2019-11-20 | DRG: 189 | Disposition: A | Discharge status: Home Health | Payer: Medicare Other | Attending: Family Medicine | Admitting: Family Medicine

## 2019-11-18 ENCOUNTER — Encounter (HOSPITAL_COMMUNITY): Payer: Self-pay | Admitting: Emergency Medicine

## 2019-11-18 ENCOUNTER — Emergency Department (HOSPITAL_COMMUNITY): Payer: Medicare Other

## 2019-11-18 DIAGNOSIS — R55 Syncope and collapse: Secondary | ICD-10-CM

## 2019-11-18 DIAGNOSIS — J219 Acute bronchiolitis, unspecified: Secondary | ICD-10-CM | POA: Diagnosis present

## 2019-11-18 DIAGNOSIS — J9621 Acute and chronic respiratory failure with hypoxia: Secondary | ICD-10-CM | POA: Diagnosis not present

## 2019-11-18 DIAGNOSIS — R112 Nausea with vomiting, unspecified: Secondary | ICD-10-CM | POA: Diagnosis present

## 2019-11-18 DIAGNOSIS — R109 Unspecified abdominal pain: Secondary | ICD-10-CM

## 2019-11-18 DIAGNOSIS — Z8701 Personal history of pneumonia (recurrent): Secondary | ICD-10-CM

## 2019-11-18 DIAGNOSIS — Z9119 Patient's noncompliance with other medical treatment and regimen: Secondary | ICD-10-CM

## 2019-11-18 DIAGNOSIS — Z79899 Other long term (current) drug therapy: Secondary | ICD-10-CM

## 2019-11-18 DIAGNOSIS — R0682 Tachypnea, not elsewhere classified: Secondary | ICD-10-CM | POA: Diagnosis present

## 2019-11-18 DIAGNOSIS — Z85028 Personal history of other malignant neoplasm of stomach: Secondary | ICD-10-CM

## 2019-11-18 DIAGNOSIS — R531 Weakness: Secondary | ICD-10-CM | POA: Diagnosis not present

## 2019-11-18 DIAGNOSIS — E860 Dehydration: Secondary | ICD-10-CM | POA: Diagnosis present

## 2019-11-18 DIAGNOSIS — Z681 Body mass index (BMI) 19 or less, adult: Secondary | ICD-10-CM

## 2019-11-18 DIAGNOSIS — F17210 Nicotine dependence, cigarettes, uncomplicated: Secondary | ICD-10-CM | POA: Diagnosis present

## 2019-11-18 DIAGNOSIS — R6510 Systemic inflammatory response syndrome (SIRS) of non-infectious origin without acute organ dysfunction: Secondary | ICD-10-CM | POA: Diagnosis present

## 2019-11-18 DIAGNOSIS — J432 Centrilobular emphysema: Secondary | ICD-10-CM | POA: Diagnosis present

## 2019-11-18 DIAGNOSIS — B0229 Other postherpetic nervous system involvement: Secondary | ICD-10-CM | POA: Diagnosis present

## 2019-11-18 DIAGNOSIS — Z9981 Dependence on supplemental oxygen: Secondary | ICD-10-CM

## 2019-11-18 DIAGNOSIS — R509 Fever, unspecified: Secondary | ICD-10-CM | POA: Diagnosis present

## 2019-11-18 DIAGNOSIS — J90 Pleural effusion, not elsewhere classified: Secondary | ICD-10-CM | POA: Diagnosis present

## 2019-11-18 DIAGNOSIS — R000 Tachycardia, unspecified: Secondary | ICD-10-CM | POA: Diagnosis present

## 2019-11-18 DIAGNOSIS — K5900 Constipation, unspecified: Secondary | ICD-10-CM | POA: Diagnosis present

## 2019-11-18 DIAGNOSIS — Z20822 Contact with and (suspected) exposure to covid-19: Secondary | ICD-10-CM | POA: Diagnosis present

## 2019-11-18 DIAGNOSIS — Z903 Acquired absence of stomach [part of]: Secondary | ICD-10-CM

## 2019-11-18 DIAGNOSIS — E43 Unspecified severe protein-calorie malnutrition: Secondary | ICD-10-CM | POA: Diagnosis present

## 2019-11-18 DIAGNOSIS — Z7951 Long term (current) use of inhaled steroids: Secondary | ICD-10-CM

## 2019-11-18 DIAGNOSIS — R1110 Vomiting, unspecified: Secondary | ICD-10-CM

## 2019-11-18 DIAGNOSIS — I739 Peripheral vascular disease, unspecified: Secondary | ICD-10-CM | POA: Diagnosis present

## 2019-11-18 DIAGNOSIS — J9601 Acute respiratory failure with hypoxia: Secondary | ICD-10-CM | POA: Diagnosis present

## 2019-11-18 LAB — CBC
HCT: 44.1 % (ref 39.0–52.0)
Hemoglobin: 13.7 g/dL (ref 13.0–17.0)
MCH: 26.9 pg (ref 26.0–34.0)
MCHC: 31.1 g/dL (ref 30.0–36.0)
MCV: 86.5 fL (ref 80.0–100.0)
Platelets: 252 10*3/uL (ref 150–400)
RBC: 5.1 MIL/uL (ref 4.22–5.81)
RDW: 16.2 % — ABNORMAL HIGH (ref 11.5–15.5)
WBC: 7 10*3/uL (ref 4.0–10.5)
nRBC: 0 % (ref 0.0–0.2)

## 2019-11-18 LAB — SARS CORONAVIRUS 2 BY RT PCR (HOSPITAL ORDER, PERFORMED IN ~~LOC~~ HOSPITAL LAB): SARS Coronavirus 2: NEGATIVE

## 2019-11-18 LAB — BASIC METABOLIC PANEL
Anion gap: 8 (ref 5–15)
BUN: 5 mg/dL — ABNORMAL LOW (ref 8–23)
CO2: 33 mmol/L — ABNORMAL HIGH (ref 22–32)
Calcium: 8.4 mg/dL — ABNORMAL LOW (ref 8.9–10.3)
Chloride: 93 mmol/L — ABNORMAL LOW (ref 98–111)
Creatinine, Ser: 0.62 mg/dL (ref 0.61–1.24)
GFR calc Af Amer: >60 mL/min (ref >60)
GFR calc non Af Amer: >60 mL/min (ref >60)
Glucose, Bld: 83 mg/dL (ref 70–99)
Potassium: 3.9 mmol/L (ref 3.5–5.1)
Sodium: 134 mmol/L — ABNORMAL LOW (ref 135–145)

## 2019-11-18 LAB — CBG MONITORING, ED: Glucose-Capillary: 89 mg/dL (ref 70–99)

## 2019-11-18 LAB — PROCALCITONIN: Procalcitonin: <0.1 ng/mL

## 2019-11-18 MED ORDER — AMITRIPTYLINE HCL 10 MG PO TABS
10.0000 mg | ORAL_TABLET | Freq: Every day | ORAL | Status: DC
  Filled 2019-11-18 (×3): qty 1

## 2019-11-18 MED ORDER — MOMETASONE FURO-FORMOTEROL FUM 200-5 MCG/ACT IN AERO
2.0000 | INHALATION_SPRAY | Freq: Two times a day (BID) | RESPIRATORY_TRACT | Status: DC
  Administered 2019-11-19 – 2019-11-20 (×2): 2 via RESPIRATORY_TRACT
  Filled 2019-11-18: qty 8.8

## 2019-11-18 MED ORDER — IPRATROPIUM-ALBUTEROL 0.5-2.5 (3) MG/3ML IN SOLN: 3.0000 mL | Freq: Four times a day (QID) | RESPIRATORY_TRACT | Status: DC

## 2019-11-18 MED ORDER — IPRATROPIUM-ALBUTEROL 0.5-2.5 (3) MG/3ML IN SOLN: 3.0000 mL | Freq: Four times a day (QID) | RESPIRATORY_TRACT | Status: DC | PRN

## 2019-11-18 MED ORDER — ADULT MULTIVITAMIN W/MINERALS CH
1.0000 | ORAL_TABLET | Freq: Every day | ORAL | Status: DC
  Administered 2019-11-18 – 2019-11-20 (×3): 1 via ORAL
  Filled 2019-11-18 (×3): qty 1

## 2019-11-18 MED ORDER — SODIUM CHLORIDE 0.9% FLUSH
3.0000 mL | Freq: Once | INTRAVENOUS | Status: AC
  Administered 2019-11-18: 3 mL via INTRAVENOUS

## 2019-11-18 MED ORDER — ENSURE ENLIVE PO LIQD
237.0000 mL | Freq: Two times a day (BID) | ORAL | Status: DC
  Administered 2019-11-19: 237 mL via ORAL
  Filled 2019-11-18 (×2): qty 237

## 2019-11-18 MED ORDER — ENOXAPARIN SODIUM 40 MG/0.4ML ~~LOC~~ SOLN: 40.0000 mg | SUBCUTANEOUS | Status: DC

## 2019-11-18 MED ORDER — SODIUM CHLORIDE 0.9 % IV SOLN: INTRAVENOUS | Status: DC

## 2019-11-18 MED ORDER — OXYCODONE-ACETAMINOPHEN 7.5-325 MG PO TABS
1.0000 | ORAL_TABLET | Freq: Four times a day (QID) | ORAL | Status: DC | PRN
  Filled 2019-11-18: qty 1

## 2019-11-18 MED ORDER — VANCOMYCIN HCL IN DEXTROSE 1-5 GM/200ML-% IV SOLN
1000.0000 mg | Freq: Once | INTRAVENOUS | Status: AC
  Administered 2019-11-18: 1000 mg via INTRAVENOUS
  Filled 2019-11-18: qty 200

## 2019-11-18 MED ORDER — BUDESON-GLYCOPYRROL-FORMOTEROL 160-9-4.8 MCG/ACT IN AERO: 2.0000 | INHALATION_SPRAY | Freq: Two times a day (BID) | RESPIRATORY_TRACT | Status: DC

## 2019-11-18 MED ORDER — TIOTROPIUM BROMIDE MONOHYDRATE 2.5 MCG/ACT IN AERS: 2.0000 | INHALATION_SPRAY | Freq: Every day | RESPIRATORY_TRACT | Status: DC

## 2019-11-18 MED ORDER — ACETAMINOPHEN 325 MG PO TABS
650.0000 mg | ORAL_TABLET | Freq: Once | ORAL | Status: AC
  Administered 2019-11-18: 650 mg via ORAL
  Filled 2019-11-18: qty 2

## 2019-11-18 MED ORDER — SODIUM CHLORIDE 0.9 % IV BOLUS
1000.0000 mL | Freq: Once | INTRAVENOUS | Status: AC
  Administered 2019-11-18: 1000 mL via INTRAVENOUS

## 2019-11-18 MED ORDER — ALBUTEROL SULFATE HFA 108 (90 BASE) MCG/ACT IN AERS
4.0000 | INHALATION_SPRAY | Freq: Once | RESPIRATORY_TRACT | Status: AC
  Administered 2019-11-18: 4 via RESPIRATORY_TRACT
  Filled 2019-11-18: qty 6.7

## 2019-11-18 MED ORDER — NICOTINE 14 MG/24HR TD PT24
14.0000 mg | MEDICATED_PATCH | Freq: Every day | TRANSDERMAL | Status: DC
  Administered 2019-11-19 – 2019-11-20 (×2): 14 mg via TRANSDERMAL
  Filled 2019-11-18 (×2): qty 1

## 2019-11-18 MED ORDER — ENOXAPARIN SODIUM 30 MG/0.3ML ~~LOC~~ SOLN
30.0000 mg | SUBCUTANEOUS | Status: DC
  Administered 2019-11-19: 30 mg via SUBCUTANEOUS
  Filled 2019-11-18 (×2): qty 0.3

## 2019-11-18 MED ORDER — PIPERACILLIN-TAZOBACTAM 3.375 G IVPB
3.3750 g | Freq: Once | INTRAVENOUS | Status: AC
  Administered 2019-11-18: 3.375 g via INTRAVENOUS
  Filled 2019-11-18: qty 50

## 2019-11-18 MED ORDER — MORPHINE SULFATE (PF) 4 MG/ML IV SOLN
4.0000 mg | INTRAVENOUS | Status: DC | PRN
  Administered 2019-11-18: 4 mg via INTRAVENOUS
  Filled 2019-11-18: qty 1

## 2019-11-18 MED ORDER — ONDANSETRON HCL 4 MG/2ML IJ SOLN
4.0000 mg | Freq: Four times a day (QID) | INTRAMUSCULAR | Status: DC | PRN
  Administered 2019-11-18 – 2019-11-19 (×2): 4 mg via INTRAVENOUS
  Filled 2019-11-18 (×2): qty 2

## 2019-11-18 MED ORDER — ONDANSETRON HCL 4 MG PO TABS: 4.0000 mg | ORAL_TABLET | Freq: Three times a day (TID) | ORAL | Status: DC | PRN

## 2019-11-18 MED ORDER — UMECLIDINIUM BROMIDE 62.5 MCG/INH IN AEPB
1.0000 | INHALATION_SPRAY | Freq: Two times a day (BID) | RESPIRATORY_TRACT | Status: DC
  Filled 2019-11-18 (×2): qty 7

## 2019-11-18 MED ORDER — MIRTAZAPINE 15 MG PO TABS
15.0000 mg | ORAL_TABLET | Freq: Every day | ORAL | Status: DC
  Filled 2019-11-18 (×2): qty 1

## 2019-11-18 MED ORDER — SODIUM CHLORIDE 0.9 % IV BOLUS
500.0000 mL | Freq: Once | INTRAVENOUS | Status: AC
  Administered 2019-11-18: 500 mL via INTRAVENOUS

## 2019-11-18 NOTE — ED Notes (Signed)
Diet NPO till DG Abd

## 2019-11-18 NOTE — ED Triage Notes (Signed)
Pt arrives from home via [facility]- pt went to restroom and walked back into living room and laid down due to feeling like he was going to pass out. Pt did not loss consciousness just felt faint.

## 2019-11-18 NOTE — ED Notes (Signed)
Help get patient back up in the bed on the monitor patient is resting with call bell in reach

## 2019-11-18 NOTE — H&P (Signed)
History and Physical  Bobby Caldwell [id number] DOB: 02-26-1956 DOA: 11/18/2019  Referring physician: Lindell Spar, PA  PCP: Patient, No Pcp Per  Outpatient Specialists: Pulmonary Patient coming from: Home  Chief Complaint: Generalized weakness   HPI: Bobby Caldwell is a 63 y.o. 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 Burr Medico, status post partial gastrectomy, peripheral artery disease, tobacco use disorder, recent left rib cage shingles, chronic depression who presented to [facility] ED due to concern for worsening generalized weakness.  Patient speaks [place], no [place] interpreter.  Majority of the history is obtained from his daughter and EDP.  Daughter states that he was recently advised to use his oxygen continuously which he started doing yesterday but today he failed to use it when going to the bathroom.  When he returned he was gasping for air and his oxygen saturation was in the 50s.  He nearly passed out.  Did not fall.  Once his oxygen was placed on his saturation normalized.  Yesterday he received his second dose of Pfizer vaccine.  His other daughter did too and had a fever post Covid-19 vaccine.  He has been having nausea with vomiting intermittently for the past month, takes zofran.  Next oncology appointment is tomorrow.  Also reports post herpetic neuralgia for which he takes amitriptyline and oxycodone, lesions are crusty but symptoms are worse at night.    ED Course: In the ED he was found to be febrile with T-max of 100.9, tachycardic HR 122, tachypneic RR of 29.  O2 saturation 98% on 2 L.  Was started on IV vancomycin and Zosyn in the ED.  Chest x-ray no evidence of pulmonary infiltrates or edema.  UA and blood cultures pending.  TRH asked to admit.  Review of Systems: Review of systems as noted in the HPI. All other systems reviewed and are negative.   Past Medical History:    Diagnosis Date  . COPD (chronic obstructive pulmonary disease) ([place])   . Dyspnea   . PAD (peripheral artery disease) ([place])   . Stomach cancer ([place])   . Tobacco abuse   . Weight loss    Past Surgical History:  Procedure Laterality Date  . ABDOMINAL AORTOGRAM W/LOWER EXTREMITY N/A 05/03/2017   Procedure: ABDOMINAL AORTOGRAM W/LOWER EXTREMITY;  Surgeon: Conrad Bogota, MD;  Location: [place] [facility];  Service: Cardiovascular;  Laterality: N/A;  . BIOPSY  08/22/2018   Procedure: BIOPSY;  Surgeon: [name], MD;  Location: [facility];  Service: Endoscopy;;  . BIOPSY  07/31/2019   Procedure: BIOPSY;  Surgeon: [name], MD;  Location: [facility];  Service: Endoscopy;;  . ESOPHAGOGASTRODUODENOSCOPY     05/24/17  . ESOPHAGOGASTRODUODENOSCOPY (EGD) WITH PROPOFOL N/A 05/24/2017   Procedure: ESOPHAGOGASTRODUODENOSCOPY (EGD) WITH PROPOFOL;  Surgeon: [name], MD;  Location: [facility];  Service: Endoscopy;  Laterality: N/A;  . ESOPHAGOGASTRODUODENOSCOPY (EGD) WITH PROPOFOL N/A 08/22/2018   Procedure: ESOPHAGOGASTRODUODENOSCOPY (EGD) WITH PROPOFOL;  Surgeon: [name], MD;  [facility];  Service: Endoscopy;  Laterality: N/A;  . ESOPHAGOGASTRODUODENOSCOPY (EGD) WITH PROPOFOL N/A 11/28/2018   Procedure: ESOPHAGOGASTRODUODENOSCOPY (EGD) WITH PROPOFOL;  Surgeon: [name], MD;  [facility]: [facility];  Service: Endoscopy;  Laterality: N/A;  . ESOPHAGOGASTRODUODENOSCOPY (EGD) WITH PROPOFOL N/A 07/31/2019   Procedure: ESOPHAGOGASTRODUODENOSCOPY (EGD) WITH PROPOFOL;  Surgeon: [name], MD;  Location: [facility];  Service: Endoscopy;  Laterality: N/A;  . EUS N/A 06/07/2017  Procedure: UPPER ENDOSCOPIC ULTRASOUND (EUS) RADIAL;  Surgeon: [name], MD;  Location: [facility];  Service: Endoscopy;  Laterality: N/A;  . FEMORAL-FEMORAL BYPASS GRAFT Bilateral 05/08/2017   Procedure: LEFT TO RIGHT BYPASS GRAFT FEMORAL-FEMORAL ARTERY;  Surgeon:  Conrad [place], MD;  Location: [place];  Service: Vascular;  Laterality: Bilateral;  . IR FLUORO GUIDE PORT INSERTION RIGHT  07/05/2017  . IR US GUIDE VASC ACCESS RIGHT  07/05/2017  . LAPAROSCOPIC INSERTION GASTROSTOMY TUBE N/A 11/27/2017   Procedure: JEJUNOSTOMY TUBE PLACEMENT;  Surgeon: [name], MD;  Location: [place];  Service: General;  Laterality: N/A;  . LAPAROSCOPIC PARTIAL GASTRECTOMY N/A 11/27/2017   Procedure: PARTIAL GASTRECTOMY;  Surgeon: [name], MD;  Location: [place];  Service: General;  Laterality: N/A;  . LAPAROSCOPY N/A 11/27/2017   Procedure: LAPAROSCOPY DIAGNOSTIC;  Surgeon: [name], MD;  Location: [place];  Service: General;  Laterality: N/A;  . NM MYOVIEW LTD  06/2017   LOW RISK. Small basal inferoseptal defect thought to be related to artifact (although cannot exclude prior infarct). Normal wall motion i in this area would suggest artifact not infarct. Otherwise no ischemia or infarction.  Marland Kitchen PERIPHERAL VASCULAR INTERVENTION Left 05/03/2017   Procedure: PERIPHERAL VASCULAR INTERVENTION;  Surgeon: Conrad Harmon, MD;  Location: [place] [facility];  Service: Cardiovascular;  Laterality: Left;  common iliac  . POLYPECTOMY  11/28/2018   Procedure: POLYPECTOMY;  Surgeon: [name], MD;  Location[facility];  Service: Endoscopy;;  . TRANSTHORACIC ECHOCARDIOGRAM  06/26/2017   Normal LV size and function. EF 60-65%. No wall motion abnormalities. GR 1 DD. Mild LA dilation    Social History:  reports that he has been smoking cigarettes. He has a 75.00 pack-year smoking history. He has never used smokeless tobacco. He reports that he does not drink alcohol or use drugs.   No Known Allergies  Family History  Family history unknown: Yes    Mother with history of unspecified cancer per his daughter.  Prior to Admission medications   Medication Sig Start Date End Date Taking? Authorizing Provider  amitriptyline (ELAVIL) 10 MG tablet Take 1 tablet (10 mg total) by  mouth at bedtime. 11/11/19  Yes Olalere, Adewale A, MD  Budeson-Glycopyrrol-Formoterol (BREZTRI AEROSPHERE) 160-9-4.8 MCG/ACT AERO Inhale 2 puffs into the lungs 2 (two) times daily. 10/22/19  Yes Lauraine Rinne, NP  budesonide-formoterol (SYMBICORT) 160-4.5 MCG/ACT inhaler Inhale 2 puffs into the lungs 2 (two) times daily. 10/09/19  Yes Olalere, Adewale A, MD  feeding supplement, ENSURE ENLIVE, (ENSURE ENLIVE) LIQD Take 237 mLs by mouth 2 (two) times daily between meals. 09/13/19  Yes Mariel Aloe, MD  ipratropium-albuterol (DUONEB) 0.5-2.5 (3) MG/3ML SOLN Inhale 3 mLs into the lungs 4 (four) times daily. Dx:J44.9 10/09/19  Yes Olalere, Adewale A, MD  mirtazapine (REMERON) 15 MG tablet Take 1 tablet (15 mg total) by mouth at bedtime. 08/22/19  Yes [name], MD  Multiple Vitamin (MULTIVITAMIN WITH MINERALS) TABS tablet Take 1 tablet by mouth daily. 09/13/19  Yes Mariel Aloe, MD  ondansetron (ZOFRAN) 4 MG tablet Take 1 tablet (4 mg total) by mouth every 8 (eight) hours as needed for nausea or vomiting. 11/12/19  Yes Olalere, Adewale A, MD  oxyCODONE-acetaminophen (PERCOCET) 7.5-325 MG tablet Take 1 tablet by mouth every 6 (six) hours as needed for severe pain. 11/11/19  Yes Olalere, Adewale A, MD  OXYGEN Inhale 2 L into the lungs continuous.   Yes [provider]  Tiotropium Bromide  Monohydrate (SPIRIVA RESPIMAT) 2.5 MCG/ACT AERS Inhale 2 puffs into the lungs daily. 10/09/19  Yes Olalere, Adewale A, MD  Amino Acids-Protein Hydrolys (FEEDING SUPPLEMENT, PRO-STAT SUGAR FREE 64,) LIQD Take 30 mLs by mouth 2 (two) times daily. Patient not taking: Reported on 11/18/2019 09/12/19   Mariel Aloe, MD    Physical Exam: BP 126/79   Pulse (!) 108   Temp 99.2 F (37.3 C) (Oral)   Resp (!) 27   Ht 5\' 5"  (1.651 m)   Wt 44.4 kg   SpO2 97%   BMI 16.29 kg/m   . General: 64 y.o. year-old male chronically ill-appearing in no acute distress.  Alert and minimally interactive. . Cardiovascular: Regular rate and  rhythm with no rubs or gallops.  No thyromegaly or JVD noted.  No lower extremity edema. 2/4 pulses in all 4 extremities. Marland Kitchen Respiratory: Clear to auscultation with no wheezes or rales. Good inspiratory effort. . Abdomen: Soft nontender nondistended with normal bowel sounds x4 quadrants.  Crusty lesions on left rib cage from recent shingles. . Muskuloskeletal: No cyanosis, clubbing or edema noted bilaterally . Neuro: CN II-XII intact, strength, sensation, reflexes . Skin: No ulcerative lesions noted, crusty lesions as noted above. Marland Kitchen Psychiatry: Judgement and insight appear normal. Mood is appropriate for condition and setting          Labs on Admission:  Basic Metabolic Panel: Recent Labs  Lab 11/18/19 0856  NA 134*  K 3.9  CL 93*  CO2 33*  GLUCOSE 83  BUN 5*  CREATININE 0.62  CALCIUM 8.4*   Liver Function Tests: No results for input(s): AST, ALT, ALKPHOS, BILITOT, PROT, ALBUMIN in the last 168 hours. No results for input(s): LIPASE, AMYLASE in the last 168 hours. No results for input(s): AMMONIA in the last 168 hours. CBC: Recent Labs  Lab 11/18/19 0856  WBC 7.0  HGB 13.7  HCT 44.1  MCV 86.5  PLT 252   Cardiac Enzymes: No results for input(s): CKTOTAL, CKMB, CKMBINDEX, TROPONINI in the last 168 hours.  BNP (last 3 results) Recent Labs    09/10/19 1241  BNP 422.6*    ProBNP (last 3 results) No results for input(s): PROBNP in the last 8760 hours.  CBG: Recent Labs  Lab 11/18/19 1151  GLUCAP 89    Radiological Exams on Admission: DG Chest Portable 1 View  Result Date: 11/18/2019 CLINICAL DATA:  Hypoxia EXAM: PORTABLE CHEST 1 VIEW COMPARISON:  Portable exam 1153 hours compared to 10/12/2019 FINDINGS: RIGHT jugular power port with tip projecting over SVC. Normal heart size, mediastinal contours, and pulmonary vascularity. Emphysematous and bronchitic changes consistent with COPD. No acute infiltrate, pleural effusion, or pneumothorax. Bones demineralized.  IMPRESSION: COPD changes without acute infiltrate. Electronically Signed   By: Lavonia Dana M.D.   On: 11/18/2019 12:35    EKG: I independently viewed the EKG done and my findings are as followed: Sinus tachycardia, rate of 120.  No specific ST-T changes.  Assessment/Plan Present on Admission: **None**  Active Problems:   Weakness  Generalized weakness likely multifactorial Suspect multifactorial in the setting of hypoxia, dehydration from poor oral intake, recent shingles, severe COPD/emphysema PT OT to assess Dietitian consult Maintain O2 saturation greater than 90% Resume oral supplement Resume pain management for shingles  SIRS, unclear etiology Presented with fever T-max 100.9, tachycardia, tachypnea, generalized weakness Started on IV vancomycin and Zosyn in the ED UA and peripheral blood cultures are pending Obtain procalcitonin level If no evidence of active infective process, DC  IV antibiotics Continue gentle IV fluid hydration  Acute on chronic hypoxic respiratory failure in the setting of severe COPD/emphysema Likely due to not using his oxygen as recommended Personally reviewed chest x-ray done on admission which showed no pulmonary infiltrates and no evidence of pulmonary edema Resume home bronchodilators/home regimen from pulmonary If hypoxia worsens obtain CTA chest to r/o PE Closely monitor on telemetry  Dehydration from poor oral intake/Nausea Encourage increase oral intake as tolerated IV fluid IV antiemetics PRN  Postherpetic neuralgia Recent diagnosis of right rib cage shingles 3 weeks ago Resume amitriptyline and oxycodone PRN Pain control  Severe protein calorie malnutrition BMI 16 Loss of muscle mass Albumin 2.7 Resume oral supplement  Stomach cancer post partial gastrectomy and chemotherapy Follows with Dr. Burr Medico  Tobacco use disorder Nicotine patch     DVT prophylaxis: Subcu Lovenox daily  Code Status:  Full code per his daughter  Isabelle Course [id number]  Family Communication:  Updated his daughter who provided majority of the history.  Disposition Plan: Admit to telemetry medical  Consults called: None  Admission status: Observation status   Status is: Observation    Dispo: The patient is from: Home.               Anticipated d/c is to: To home              Anticipated d/c date is: 11/19/19               Patient currently ongoing work-up for present condition.       Kayleen Memos MD [facility] Pager [phone]  If 7PM-7AM, please contact night-coverage [email or web address] Password [facility]  11/18/2019, 3:38 PM

## 2019-11-18 NOTE — ED Notes (Signed)
Previous RN unable to give medications. Meds require verification by pharmacy. This RN sent secure message to pharmacy requesting verification

## 2019-11-18 NOTE — ED Notes (Signed)
Tele Dinner ordered 

## 2019-11-18 NOTE — Progress Notes (Signed)
Was asked to see patient for abdominal pain and vomiting. He has hx of gastric cancer s/p partial gastrectomy and is admitted with generalized weakness. He had an episode of severe epigastric pain that has improved after vomiting.   He has some tenderness in epigastrium without peritoneal signs. Only rare bowel sounds heard. Plan to check KUB, keep NPO pending results, continue symptom-control with antiemetics and analgesia.

## 2019-11-18 NOTE — ED Notes (Signed)
Wife at bedside.

## 2019-11-18 NOTE — ED Notes (Signed)
Pt found standing up at side of bed bent over and c/o worsening abdominal pain. Updated Dr.Opyd in regards to situation. MD to assess pt.

## 2019-11-18 NOTE — ED Notes (Signed)
Pt x1 Emesis Given IV zofran. Nausea subsided. On GI assessment noted tenderness below epigastric area. Pt does have a HX of gastri carcinoma. Dr. Myna Hidalgo informed.

## 2019-11-18 NOTE — ED Provider Notes (Signed)
[facility] EMERGENCY DEPARTMENT Provider Note   CSN: [id number] Arrival date & time: 11/18/19  [id number]     History Chief Complaint  Patient presents with   Near Syncope    Bobby Caldwell is a 64 y.o. male with pertinent past medical history of COPD, PAD, stomach cancer, tobacco abuse that presents emergency department today for near syncope.  Patient's preferred language is [place], tried using interpreter but there was no [place] interpreter.  I called the daughter who was able to translate, patient was okay with this.  Daughter states that he normally wears oxygen as needed at home, however last week when nursing staff came to his house he was prescribed 24/7 oxygen due to to his worsening COPD.  Daughter states that he has been constantly wearing this, however when he went to the bathroom today he did not bring it with him and nearly syncopized after going to the bathroom.  Patient did not hit his head or lose consciousness.  Daughter states that he was gasping for air and his O2 sat was in the 10s.  Daughter states that he did not turn blue.  Daughter states that they were able to put oxygen back on him and he was able to come back to his normal baseline.  She states that he did not have oxygen for about a minute.  Daughter tried to ask patient if he was having any symptoms or pain.  Patient denied any pain or symptoms -is not currently complaining of any chest pain or shortness of breath. Of note patient just obtained second dose of Pfizer vaccine yesterday.  Daughter states that he has not been having a fever or complaining of any other symptoms at home.   Daughter also states that patient was recently diagnosed with shingles and is recovering from that currently.  Not able to obtain any history from the patient, daughter answers all questions and tells story. Daughter states that he has a doctor appointment on Thursday due to his worsening COPD and oxygen requirement.  HPI     Past Medical History:  Diagnosis Date   COPD (chronic obstructive pulmonary disease) (HCC)    Dyspnea    PAD (peripheral artery disease) ([place])    Stomach cancer ([place])    Tobacco abuse    Weight loss     Patient Active Problem List   Diagnosis Date Noted   Weakness 11/18/2019   Medication management 10/22/2019   Healthcare maintenance 10/22/2019   Acute on chronic respiratory failure with hypoxia and hypercapnia (HCC) 10/12/2019   Hyponatremia 10/12/2019   History of COVID-19 10/12/2019   Acute respiratory failure with hypoxia ([place]) 09/11/2019   Protein-calorie malnutrition, severe 09/11/2019   Acute respiratory failure with hypoxia and hypercapnia ([place]) 09/10/2019   COPD with acute exacerbation ([place]) 09/10/2019   History of gastric cancer    Gastric mass    Dysphagia    Chronic hypoxemic respiratory failure ([place]) 03/13/2018   S/P gastric surgery 01/22/2018   Nausea 12/20/2017   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   S/P vascular surgery 11/15/2017   Pneumonia 09/10/2017   Acute on chronic respiratory failure with hypoxia ([place]) 09/10/2017   Exercise hypoxemia 09/04/2017   Moderate protein-calorie malnutrition ([place]) 07/24/2017   Port-A-Cath in place 07/06/2017   DOE (dyspnea on exertion) 06/12/2017   Atypical chest pain 06/12/2017   Essential hypertension 06/12/2017   Preop cardiovascular exam 06/12/2017   Gastric adenocarcinoma ([place]) 06/04/2017   Gastritis and gastroduodenitis  Atherosclerosis of native arteries of extremity with intermittent claudication ([place]) 05/03/2017   Critical lower limb ischemia 05/03/2017   Abnormal CT of the chest 08/02/2015   Underweight 03/30/2015   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   Weight loss 02/16/2015   Cigarette smoker 02/16/2015    Past Surgical History:  Procedure Laterality Date   ABDOMINAL AORTOGRAM W/LOWER EXTREMITY  N/A 05/03/2017   Procedure: ABDOMINAL AORTOGRAM W/LOWER EXTREMITY;  Surgeon: Conrad Kobuk, MD;  Location: [facility];  Service: Cardiovascular;  Laterality: N/A;   BIOPSY  08/22/2018   Procedure: BIOPSY;  Surgeon: [name], MD;  [facility]: [facility];  Service: Endoscopy;;   BIOPSY  07/31/2019   Procedure: BIOPSY;  Surgeon: [name], MD;  [facility];  Service: Endoscopy;;   ESOPHAGOGASTRODUODENOSCOPY     05/24/17   ESOPHAGOGASTRODUODENOSCOPY (EGD) WITH PROPOFOL N/A 05/24/2017   Procedure: ESOPHAGOGASTRODUODENOSCOPY (EGD) WITH PROPOFOL;  Surgeon: [name], MD;  [facility];  Service: Endoscopy;  Laterality: N/A;   ESOPHAGOGASTRODUODENOSCOPY (EGD) WITH PROPOFOL N/A 08/22/2018   Procedure: ESOPHAGOGASTRODUODENOSCOPY (EGD) WITH PROPOFOL;  Surgeon: [name], MD;  [facility];  Service: Endoscopy;  Laterality: N/A;   ESOPHAGOGASTRODUODENOSCOPY (EGD) WITH PROPOFOL N/A 11/28/2018   Procedure: ESOPHAGOGASTRODUODENOSCOPY (EGD) WITH PROPOFOL;  Surgeon: [name], MD;  [facility];  Service: Endoscopy;  Laterality: N/A;   ESOPHAGOGASTRODUODENOSCOPY (EGD) WITH PROPOFOL N/A 07/31/2019   Procedure: ESOPHAGOGASTRODUODENOSCOPY (EGD) WITH PROPOFOL;  Surgeon: [name], MD;  [facility];  Service: Endoscopy;  Laterality: N/A;   EUS N/A 06/07/2017   Procedure: UPPER ENDOSCOPIC ULTRASOUND (EUS) RADIAL;  Surgeon: [name], MD;  [facility];  Service: Endoscopy;  Laterality: N/A;   FEMORAL-FEMORAL BYPASS GRAFT Bilateral 05/08/2017   Procedure: LEFT TO RIGHT BYPASS GRAFT FEMORAL-FEMORAL ARTERY;  Surgeon: Conrad Harper, MD;  Location: [facility];  Service: Vascular;  Laterality: Bilateral;   IR FLUORO GUIDE PORT INSERTION RIGHT  07/05/2017   IR US GUIDE VASC ACCESS RIGHT  07/05/2017   LAPAROSCOPIC INSERTION GASTROSTOMY TUBE N/A 11/27/2017   Procedure: JEJUNOSTOMY TUBE PLACEMENT;  Surgeon:  [name], MD;  Location: [facility];  Service: General;  Laterality: N/A;   LAPAROSCOPIC PARTIAL GASTRECTOMY N/A 11/27/2017   Procedure: PARTIAL GASTRECTOMY;  Surgeon: [name], MD;  Location: [facility];  Service: General;  Laterality: N/A;   LAPAROSCOPY N/A 11/27/2017   Procedure: LAPAROSCOPY DIAGNOSTIC;  Surgeon: [name], MD;  Location: [facility];  Service: General;  Laterality: N/A;   NM MYOVIEW LTD  06/2017   LOW RISK. Small basal inferoseptal defect thought to be related to artifact (although cannot exclude prior infarct). Normal wall motion i in this area would suggest artifact not infarct. Otherwise no ischemia or infarction.   PERIPHERAL VASCULAR INTERVENTION Left 05/03/2017   Procedure: PERIPHERAL VASCULAR INTERVENTION;  Surgeon: Conrad Tiger, MD;  Location: [facility];  Service: Cardiovascular;  Laterality: Left;  common iliac   POLYPECTOMY  11/28/2018   Procedure: POLYPECTOMY;  Surgeon: [name], MD;  [facility];  Service: Endoscopy;;   TRANSTHORACIC ECHOCARDIOGRAM  06/26/2017   Normal LV size and function. EF 60-65%. No wall motion abnormalities. GR 1 DD. Mild LA dilation       Family History  Family history unknown: Yes    Social History   Tobacco Use   Smoking status: Current Every Day Smoker    Packs/day: 1.50    Years: 50.00  Pack years: 75.00    Types: Cigarettes    Last attempt to quit: 11/2017    Years since quitting: 2.0   Smokeless tobacco: Never Used   Tobacco comment: 8 cigs per day 08/05/19  Substance Use Topics   Alcohol use: No    Alcohol/week: 0.0 standard drinks   Drug use: No    Home Medications Prior to Admission medications   Medication Sig Start Date End Date Taking? Authorizing Provider  amitriptyline (ELAVIL) 10 MG tablet Take 1 tablet (10 mg total) by mouth at bedtime. 11/11/19  Yes Olalere, Adewale A, MD  Budeson-Glycopyrrol-Formoterol (BREZTRI AEROSPHERE) 160-9-4.8 MCG/ACT AERO Inhale 2 puffs into  the lungs 2 (two) times daily. 10/22/19  Yes Lauraine Rinne, NP  budesonide-formoterol (SYMBICORT) 160-4.5 MCG/ACT inhaler Inhale 2 puffs into the lungs 2 (two) times daily. 10/09/19  Yes Olalere, Adewale A, MD  feeding supplement, ENSURE ENLIVE, (ENSURE ENLIVE) LIQD Take 237 mLs by mouth 2 (two) times daily between meals. 09/13/19  Yes Mariel Aloe, MD  ipratropium-albuterol (DUONEB) 0.5-2.5 (3) MG/3ML SOLN Inhale 3 mLs into the lungs 4 (four) times daily. Dx:J44.9 10/09/19  Yes Olalere, Adewale A, MD  mirtazapine (REMERON) 15 MG tablet Take 1 tablet (15 mg total) by mouth at bedtime. 08/22/19  Yes [name], MD  Multiple Vitamin (MULTIVITAMIN WITH MINERALS) TABS tablet Take 1 tablet by mouth daily. 09/13/19  Yes Mariel Aloe, MD  ondansetron (ZOFRAN) 4 MG tablet Take 1 tablet (4 mg total) by mouth every 8 (eight) hours as needed for nausea or vomiting. 11/12/19  Yes Olalere, Adewale A, MD  oxyCODONE-acetaminophen (PERCOCET) 7.5-325 MG tablet Take 1 tablet by mouth every 6 (six) hours as needed for severe pain. 11/11/19  Yes Olalere, Adewale A, MD  OXYGEN Inhale 2 L into the lungs continuous.   Yes [provider]  Tiotropium Bromide Monohydrate (SPIRIVA RESPIMAT) 2.5 MCG/ACT AERS Inhale 2 puffs into the lungs daily. 10/09/19  Yes Olalere, Adewale A, MD  Amino Acids-Protein Hydrolys (FEEDING SUPPLEMENT, PRO-STAT SUGAR FREE 64,) LIQD Take 30 mLs by mouth 2 (two) times daily. Patient not taking: Reported on 11/18/2019 09/12/19   Mariel Aloe, MD    Allergies    Patient has no known allergies.  Review of Systems   Review of Systems  Constitutional: Negative for chills, diaphoresis, fatigue and fever.  HENT: Negative for congestion, sore throat and trouble swallowing.   Eyes: Negative for pain and visual disturbance.  Respiratory: Negative for cough, shortness of breath and wheezing.   Cardiovascular: Negative for chest pain, palpitations and leg swelling.  Gastrointestinal: Negative for abdominal  distention, abdominal pain, diarrhea, nausea and vomiting.  Genitourinary: Negative for difficulty urinating.  Musculoskeletal: Negative for back pain, neck pain and neck stiffness.  Skin: Negative for pallor.  Neurological: Positive for syncope (Near syncope). Negative for dizziness, speech difficulty, weakness and headaches.  Psychiatric/Behavioral: Negative for confusion.    Physical Exam Updated Vital Signs BP 126/79    Pulse (!) 109    Temp 99.2 F (37.3 C) (Oral)    Resp (!) 23    Ht 5\' 5"  (1.651 m)    Wt 44.4 kg    SpO2 98%    BMI 16.29 kg/m   Physical Exam Constitutional:      General: He is not in acute distress.    Appearance: Normal appearance. He is not ill-appearing, toxic-appearing or diaphoretic.  HENT:     Head: Normocephalic and atraumatic.     Mouth/Throat:  Mouth: Mucous membranes are moist.     Pharynx: Oropharynx is clear.  Eyes:     General: No scleral icterus.    Extraocular Movements: Extraocular movements intact.     Pupils: Pupils are equal, round, and reactive to light.  Cardiovascular:     Rate and Rhythm: Regular rhythm. Tachycardia present.     Pulses: Normal pulses.     Heart sounds: Normal heart sounds.  Pulmonary:     Effort: Pulmonary effort is normal. No respiratory distress.     Breath sounds: No stridor. Wheezing (Scant in all lung fields) present. No rhonchi or rales.  Chest:     Chest wall: No tenderness.  Abdominal:     General: Abdomen is flat. There is no distension.     Palpations: Abdomen is soft.     Tenderness: There is no abdominal tenderness. There is no guarding or rebound.  Musculoskeletal:        General: No swelling or tenderness. Normal range of motion.     Cervical back: Normal range of motion and neck supple. No rigidity.     Right lower leg: No edema.     Left lower leg: No edema.  Skin:    General: Skin is warm and dry.     Capillary Refill: Capillary refill takes less than 2 seconds.     Coloration: Skin is  not pale.     Findings: Rash (Scaling rash suggestive of shingles, all lesions crusted and helaing on left side T 10 distribution) present.  Neurological:     General: No focal deficit present.     Mental Status: He is alert and oriented to person, place, and time. Mental status is at baseline.     Motor: No weakness.     ED Results / Procedures / Treatments   Labs (all labs ordered are listed, but only abnormal results are displayed) Labs Reviewed  BASIC METABOLIC PANEL - Abnormal; Notable for the following components:      Result Value   Sodium 134 (*)    Chloride 93 (*)    CO2 33 (*)    BUN 5 (*)    Calcium 8.4 (*)    All other components within normal limits  CBC - Abnormal; Notable for the following components:   RDW 16.2 (*)    All other components within normal limits  CULTURE, BLOOD (ROUTINE X 2)  CULTURE, BLOOD (ROUTINE X 2)  CULTURE, BLOOD (ROUTINE X 2)  URINALYSIS, ROUTINE W REFLEX MICROSCOPIC  CBG MONITORING, ED    EKG EKG Interpretation  Date/Time:  Tuesday Nov 18 2019 08:41:47 EDT Ventricular Rate:  120 PR Interval:  166 QRS Duration: 66 QT Interval:  310 QTC Calculation: 438 R Axis:   90 Text Interpretation: Sinus tachycardia Rightward axis Borderline ECG similar to April 2021 Confirmed by Sherwood Gambler [phone]) on 11/18/2019 11:56:45 AM   Radiology DG Chest Portable 1 View  Result Date: 11/18/2019 CLINICAL DATA:  Hypoxia EXAM: PORTABLE CHEST 1 VIEW COMPARISON:  Portable exam 1153 hours compared to 10/12/2019 FINDINGS: RIGHT jugular power port with tip projecting over SVC. Normal heart size, mediastinal contours, and pulmonary vascularity. Emphysematous and bronchitic changes consistent with COPD. No acute infiltrate, pleural effusion, or pneumothorax. Bones demineralized. IMPRESSION: COPD changes without acute infiltrate. Electronically Signed   By: Lavonia Dana M.D.   On: 11/18/2019 12:35    Procedures Procedures (including critical care  time)  Medications Ordered in ED Medications  vancomycin (VANCOCIN) IVPB 1000 mg/200 mL  premix (1,000 mg Intravenous New Bag/Given 11/18/19 1351)  piperacillin-tazobactam (ZOSYN) IVPB 3.375 g (3.375 g Intravenous New Bag/Given 11/18/19 1350)  sodium chloride flush (NS) 0.9 % injection 3 mL (3 mLs Intravenous Given 11/18/19 1153)  sodium chloride 0.9 % bolus 1,000 mL (1,000 mLs Intravenous New Bag/Given 11/18/19 1153)  albuterol (VENTOLIN HFA) 108 (90 Base) MCG/ACT inhaler 4 puff (4 puffs Inhalation Given 11/18/19 1250)  acetaminophen (TYLENOL) tablet 650 mg (650 mg Oral Given 11/18/19 1256)  sodium chloride 0.9 % bolus 500 mL (500 mLs Intravenous New Bag/Given 11/18/19 1351)    ED Course  I have reviewed the triage vital signs and the nursing notes.  Pertinent labs & imaging results that were available during my care of the patient were reviewed by me and considered in my medical decision making (see chart for details).    MDM Rules/Calculators/A&P                      Bobby Caldwell is a 64 y.o. male with pertinent past medical history of COPD, PAD, stomach cancer, tobacco abuse that presents emergency department today for near syncope.  After speaking to daughter it sounds like patient had hypoxic event and has now returned to baseline. Patient is tachycardic with a temperature of 100.9, this is most likely from patient receiving Covid vaccine yesterday.  Daughter states that whole house is feeling similar.  However will obtain basic labs, chest x-ray, UA to rule out sources of infection other than Covid vaccine.  Patient is not complaining of shortness of breath, chest pain, pain anywhere.  Patient states that his breathing has improved and is satting at 96 on 3 L.  Patient is normally at 2 to 3 L at home. Patient is not in respiratory distress, no use of accessory muscles.    I ordered, reviewed and interpreted labs which included stable CBC and CMP.  UA pending.  I ordered medications in the ER  to include 2 L of fluid, albuterol due to scant wheezes, Tylenol due to temperature of 100.9. I ordered imaging studies to include chest x-ray and I independently reviewed and interpreted by me which showed COPD with no acute changes.  Upon reassessment patient is still febrile and tachy even with bolus, will consider sepsis,Dr. Stark Jock started IV Vanc and Zosyn on patient due to potential sepsis and history of gastric cancer and COPD.  Will consult hospitalist for observation. Blood cultures ordered.    2:31 PM discussed case with hospitalist, Dr. Nevada Crane, who agrees to accept care of patient.  The patient appears reasonably stabilized for admission considering the current resources, flow, and capabilities available in the ED at this time, and I doubt any other [facility] requiring further screening and/or treatment in the ED prior to admission.   .I discussed this case with my attending physician, Dr. Stark Jock, who cosigned this note including patient's presenting symptoms, physical exam, and planned diagnostics and interventions. Attending physician stated agreement with plan or made changes to plan which were implemented.   Attending physician assessed patient at bedside.    Final Clinical Impression(s) / ED Diagnoses Final diagnoses:  Near syncope  Fever, unspecified fever cause    Rx / DC Orders ED Discharge Orders    None       Alfredia Client, PA-C 11/19/19 1108    Veryl Speak, MD 11/22/19 [phone]

## 2019-11-19 ENCOUNTER — Other Ambulatory Visit: Payer: Medicare Other

## 2019-11-19 ENCOUNTER — Observation Stay (HOSPITAL_COMMUNITY): Payer: Medicare Other

## 2019-11-19 ENCOUNTER — Ambulatory Visit: Payer: Medicare Other | Admitting: Pulmonary Disease

## 2019-11-19 DIAGNOSIS — R531 Weakness: Secondary | ICD-10-CM | POA: Diagnosis present

## 2019-11-19 DIAGNOSIS — K5900 Constipation, unspecified: Secondary | ICD-10-CM | POA: Diagnosis present

## 2019-11-19 DIAGNOSIS — J9621 Acute and chronic respiratory failure with hypoxia: Secondary | ICD-10-CM | POA: Diagnosis present

## 2019-11-19 DIAGNOSIS — Z903 Acquired absence of stomach [part of]: Secondary | ICD-10-CM | POA: Diagnosis not present

## 2019-11-19 DIAGNOSIS — J219 Acute bronchiolitis, unspecified: Secondary | ICD-10-CM | POA: Diagnosis present

## 2019-11-19 DIAGNOSIS — E43 Unspecified severe protein-calorie malnutrition: Secondary | ICD-10-CM | POA: Diagnosis present

## 2019-11-19 DIAGNOSIS — E860 Dehydration: Secondary | ICD-10-CM | POA: Diagnosis present

## 2019-11-19 DIAGNOSIS — B0229 Other postherpetic nervous system involvement: Secondary | ICD-10-CM | POA: Diagnosis present

## 2019-11-19 DIAGNOSIS — J9601 Acute respiratory failure with hypoxia: Secondary | ICD-10-CM

## 2019-11-19 DIAGNOSIS — Z79899 Other long term (current) drug therapy: Secondary | ICD-10-CM | POA: Diagnosis not present

## 2019-11-19 DIAGNOSIS — Z681 Body mass index (BMI) 19 or less, adult: Secondary | ICD-10-CM | POA: Diagnosis not present

## 2019-11-19 DIAGNOSIS — Z20822 Contact with and (suspected) exposure to covid-19: Secondary | ICD-10-CM | POA: Diagnosis present

## 2019-11-19 DIAGNOSIS — J90 Pleural effusion, not elsewhere classified: Secondary | ICD-10-CM | POA: Diagnosis present

## 2019-11-19 DIAGNOSIS — Z9981 Dependence on supplemental oxygen: Secondary | ICD-10-CM | POA: Diagnosis not present

## 2019-11-19 DIAGNOSIS — F17210 Nicotine dependence, cigarettes, uncomplicated: Secondary | ICD-10-CM | POA: Diagnosis present

## 2019-11-19 DIAGNOSIS — R509 Fever, unspecified: Secondary | ICD-10-CM | POA: Diagnosis present

## 2019-11-19 DIAGNOSIS — R0682 Tachypnea, not elsewhere classified: Secondary | ICD-10-CM | POA: Diagnosis present

## 2019-11-19 DIAGNOSIS — Z85028 Personal history of other malignant neoplasm of stomach: Secondary | ICD-10-CM | POA: Diagnosis not present

## 2019-11-19 DIAGNOSIS — R000 Tachycardia, unspecified: Secondary | ICD-10-CM | POA: Diagnosis present

## 2019-11-19 DIAGNOSIS — I739 Peripheral vascular disease, unspecified: Secondary | ICD-10-CM | POA: Diagnosis present

## 2019-11-19 DIAGNOSIS — R6510 Systemic inflammatory response syndrome (SIRS) of non-infectious origin without acute organ dysfunction: Secondary | ICD-10-CM | POA: Diagnosis present

## 2019-11-19 DIAGNOSIS — Z9119 Patient's noncompliance with other medical treatment and regimen: Secondary | ICD-10-CM | POA: Diagnosis not present

## 2019-11-19 DIAGNOSIS — R112 Nausea with vomiting, unspecified: Secondary | ICD-10-CM | POA: Diagnosis present

## 2019-11-19 DIAGNOSIS — Z7951 Long term (current) use of inhaled steroids: Secondary | ICD-10-CM | POA: Diagnosis not present

## 2019-11-19 DIAGNOSIS — Z8701 Personal history of pneumonia (recurrent): Secondary | ICD-10-CM | POA: Diagnosis not present

## 2019-11-19 DIAGNOSIS — J432 Centrilobular emphysema: Secondary | ICD-10-CM | POA: Diagnosis present

## 2019-11-19 MED ORDER — SENNOSIDES-DOCUSATE SODIUM 8.6-50 MG PO TABS
2.0000 | ORAL_TABLET | Freq: Every day | ORAL | Status: DC
  Administered 2019-11-20: 2 via ORAL
  Filled 2019-11-19: qty 2

## 2019-11-19 MED ORDER — CHLORHEXIDINE GLUCONATE CLOTH 2 % EX PADS
6.0000 | MEDICATED_PAD | Freq: Every day | CUTANEOUS | Status: DC
  Administered 2019-11-19 – 2019-11-20 (×2): 6 via TOPICAL

## 2019-11-19 MED ORDER — ENSURE ENLIVE PO LIQD
237.0000 mL | Freq: Three times a day (TID) | ORAL | Status: DC
  Administered 2019-11-19 (×2): 237 mL via ORAL

## 2019-11-19 MED ORDER — UMECLIDINIUM BROMIDE 62.5 MCG/INH IN AEPB
1.0000 | INHALATION_SPRAY | Freq: Every day | RESPIRATORY_TRACT | Status: DC
  Administered 2019-11-20: 1 via RESPIRATORY_TRACT
  Filled 2019-11-19: qty 7

## 2019-11-19 MED ORDER — OXYCODONE-ACETAMINOPHEN 5-325 MG PO TABS
1.5000 | ORAL_TABLET | Freq: Four times a day (QID) | ORAL | Status: DC | PRN
  Administered 2019-11-19 – 2019-11-20 (×3): 1.5 via ORAL
  Filled 2019-11-19 (×3): qty 2

## 2019-11-19 MED ORDER — IOHEXOL 350 MG/ML SOLN
57.0000 mL | Freq: Once | INTRAVENOUS | Status: AC | PRN
  Administered 2019-11-19: 57 mL via INTRAVENOUS

## 2019-11-19 NOTE — Progress Notes (Signed)
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 He was also generally weak the day following the 2nd covid vaccine. In the ED he was found to be febrile with T-max of 100.9, tachycardic HR 122, tachypneic RR of 29.  O2 saturation 98% on 2 L.  Was started on IV vancomycin and Zosyn in the ED.  Chest x-ray no evidence of pulmonary infiltrates or edema. Subsequent CTA chest revealed no pulmonary emboli.  Assessment & Plan: Active Problems:   Acute respiratory failure with hypoxia (HCC)   Weakness  Generalized weakness: Multifactorial due to hypoxia, dehydration from poor oral intake, recent shingles, severe COPD/emphysema, and malnutrition. - PT/OT recommending home health which will be arranged at discharge.  - Continue treating conditions as below  SIRS, unclear etiology: No evidence of active infectious process, PCT negative.  Presented with fever T-max 100.8F, tachycardia, tachypnea, generalized weakness - Continue IV hydration  Acute on chronic hypoxic respiratory failure in the setting of severe COPD/emphysema: Partially due to nonadherence. Also possibly related to air trapping or bronchiolitis and small right pleural effusion. - Will stop IVF to avoid pulmonary edema.  - Incentive spirometry.  - Recheck ambulatory pulse oximetry 5/13.  - Bronchodilators  Dehydration from poor oral intake, nausea, constipation: Nonobstructive bowel gas pattern - Senna - Encourage po intake.   Postherpetic neuralgia: Recent diagnosis, all lesions crusted, no active lesions.  - Resume amitriptyline and oxycodone PRN  Severe protein calorie  malnutrition BMI 16 Loss of muscle mass Albumin 2.7 - Protein supplement  Stomach cancer s/p partial gastrectomy and chemotherapy:  Follows with Dr. Burr Medico  Tobacco use disorder Nicotine patch  DVT prophylaxis: Lovenox 30mg  q24h Code Status: Full Family Communication: None at bedside, attempted to speak with patient's brother by phone, also called daughter (contact in EMR) x2 without answer, LVM to call back. Disposition Plan: Status will need to change to Inpatient as the patient continues to require supplemental oxygen for acute respiratory failure beyond the chronic hypoxemia after observation period.   Remains inpatient appropriate because:Inpatient level of care appropriate due to severity of illness. He will require 2 midnights of inpatient level of care.  Dispo: The patient is from: Home              Anticipated d/c is to: Home              Anticipated d/c date is: 1 day              Patient currently is not medically stable to d/c.  Consultants:   None  Procedures:   None  Antimicrobials:  Vancomycin, zosyn 5/11   Subjective: [place] interpretor not available, unable to reach family per pt preference. Speaks some english, states breathing is slightly better but worse than usual, "very bad." No chest pain or other complaints.   Objective: Vitals:   11/19/19 0500 11/19/19 0534 11/19/19 0817 11/19/19 1701  BP:   111/74 97/65  Pulse:   (!) 106 (!) 109  Resp:   19 20  Temp: 98.8 F (37.1 C)  98 F (36.7 C) 99.1 F (37.3 C)  TempSrc: Oral     SpO2:  93% 99% 100%  Weight:      Height:       No intake or output data in the 24 hours ending 11/19/19 1740 Filed Weights   11/18/19 1136  Weight: 44.4 kg    Gen: 64 y.o. male in no distress  Pulm: Non-labored but tachypneic. Clear, diminished. CV: Regular rate and rhythm. No murmur, rub, or gallop. No JVD, no pedal edema. GI: Abdomen soft, non-tender, non-distended, with normoactive bowel sounds. No  organomegaly or masses felt. Ext: Warm, no deformities Skin: Crusted left lower ribcage lesions intact Neuro: Alert and oriented. No focal neurological deficits. Psych: Judgement and insight appear normal. Mood & affect appropriate.   Data Reviewed: I have personally reviewed following labs and imaging studies  CBC: Recent Labs  Lab 11/18/19 0856  WBC 7.0  HGB 13.7  HCT 44.1  MCV 86.5  PLT [id number]   Basic Metabolic Panel: Recent Labs  Lab 11/18/19 0856  NA 134*  K 3.9  CL 93*  CO2 33*  GLUCOSE 83  BUN 5*  CREATININE 0.62  CALCIUM 8.4*   GFR: Estimated Creatinine Clearance: 60.1 mL/min (by C-G formula based on SCr of 0.62 mg/dL). Liver Function Tests: No results for input(s): AST, ALT, ALKPHOS, BILITOT, PROT, ALBUMIN in the last 168 hours. No results for input(s): LIPASE, AMYLASE in the last 168 hours. No results for input(s): AMMONIA in the last 168 hours. Coagulation Profile: No results for input(s): INR, PROTIME in the last 168 hours. Cardiac Enzymes: No results for input(s): CKTOTAL, CKMB, CKMBINDEX, TROPONINI in the last 168 hours. BNP (last 3 results) No results for input(s): PROBNP in the last 8760 hours. HbA1C: No results for input(s): HGBA1C in the last 72 hours. CBG: Recent Labs  Lab 11/18/19 1151  GLUCAP 89   Lipid Profile: No results for input(s): CHOL, HDL, LDLCALC, TRIG, CHOLHDL, LDLDIRECT in the last 72 hours. Thyroid Function Tests: No results for input(s): TSH, T4TOTAL, FREET4, T3FREE, THYROIDAB in the last 72 hours. Anemia Panel: No results for input(s): VITAMINB12, FOLATE, FERRITIN, TIBC, IRON, RETICCTPCT in the last 72 hours. Urine analysis:    Component Value Date/Time   COLORURINE YELLOW 10/12/2019 1024   APPEARANCEUR CLEAR 10/12/2019 1024   LABSPEC 1.014 10/12/2019 1024   PHURINE 6.0 10/12/2019 1024   GLUCOSEU NEGATIVE 10/12/2019 1024   HGBUR NEGATIVE 10/12/2019 1024   BILIRUBINUR NEGATIVE 10/12/2019 1024   KETONESUR 20 (A)  10/12/2019 1024   PROTEINUR NEGATIVE 10/12/2019 1024   NITRITE NEGATIVE 10/12/2019 1024   LEUKOCYTESUR NEGATIVE 10/12/2019 1024   Recent Results (from the past 240 hour(s))  Blood culture (routine x 2)     Status: None (Preliminary result)   Collection Time: 11/18/19  1:10 PM   Specimen: BLOOD  Result Value Ref Range Status   Specimen Description BLOOD LEFT ANTECUBITAL  Final   Special Requests   Final    BOTTLES DRAWN AEROBIC AND ANAEROBIC Blood Culture adequate volume   Culture   Final    NO GROWTH < 24 HOURS Performed at [facility], [place] [address], [place], [place] [address]    Report Status PENDING  Incomplete  Blood culture (routine x 2)     Status: None (Preliminary result)   Collection Time: 11/18/19  4:30 PM   Specimen: BLOOD RIGHT ARM  Result Value Ref Range Status   Specimen Description BLOOD RIGHT ARM  Final   Special Requests   Final    BOTTLES DRAWN AEROBIC AND ANAEROBIC Blood Culture adequate volume   Culture  Final    NO GROWTH < 12 HOURS Performed at [place] [address] [place], [place] [address]    Report Status PENDING  Incomplete  SARS Coronavirus 2 by RT PCR (hospital order, performed in [facility] hospital lab) Nasopharyngeal Nasopharyngeal Swab     Status: None   Collection Time: 11/18/19  5:44 PM   Specimen: Nasopharyngeal Swab  Result Value Ref Range Status   SARS Coronavirus 2 NEGATIVE NEGATIVE Final    Comment: (NOTE) SARS-CoV-2 target nucleic acids are NOT DETECTED. The SARS-CoV-2 RNA is generally detectable in upper and lower respiratory specimens during the acute phase of infection. The lowest concentration of SARS-CoV-2 viral copies this assay can detect is 250 copies / mL. A negative result does not preclude SARS-CoV-2 infection and should not be used as the sole basis for treatment or other patient management decisions.  A negative result may occur with improper specimen collection / handling, submission of specimen  other than nasopharyngeal swab, presence of viral mutation(s) within the areas targeted by this assay, and inadequate number of viral copies (<250 copies / mL). A negative result must be combined with clinical observations, patient history, and epidemiological information. Fact Sheet for Patients:   StrictlyIdeas.no Fact Sheet for Healthcare Providers: BankingDealers.co.za This test is not yet approved or cleared  by the [place] FDA and has been authorized for detection and/or diagnosis of SARS-CoV-2 by FDA under an Emergency Use Authorization (EUA).  This EUA will remain in effect (meaning this test can be used) for the duration of the COVID-19 declaration under Section 564(b)(1) of the Act, 21 U.S.C. section 360bbb-3(b)(1), unless the authorization is terminated or revoked sooner. Performed at [facility], [place] [address] [place],  [address]       Radiology Studies: DG Abd 1 View  Result Date: 11/18/2019 CLINICAL DATA:  Abdominal pain and vomiting EXAM: ABDOMEN - 1 VIEW COMPARISON:  None. FINDINGS: The bowel gas pattern is normal. There is a moderate amount of colonic stool. Vascular stent is seen overlying the left iliac vasculature. The lung bases are clear. No radio-opaque calculi or other significant radiographic abnormality are seen. IMPRESSION: Nonobstructive bowel gas pattern.  Moderate amount of colonic stool. Electronically Signed   By: Prudencio Pair M.D.   On: 11/18/2019 22:31   CT ANGIO CHEST PE W OR WO CONTRAST  Result Date: 11/19/2019 CLINICAL DATA:  Shortness of breath, tachycardia, hypoxemia EXAM: CT ANGIOGRAPHY CHEST WITH CONTRAST TECHNIQUE: Multidetector CT imaging of the chest was performed using the standard protocol during bolus administration of intravenous contrast. Multiplanar CT image reconstructions and MIPs were obtained to evaluate the vascular anatomy. CONTRAST:  60mL OMNIPAQUE IOHEXOL 350 MG/ML  SOLN COMPARISON:  09/10/2019, 02/22/2015 FINDINGS: Cardiovascular: Satisfactory opacification of the pulmonary arteries to the segmental level. No evidence of pulmonary embolism. Normal heart size. No pericardial effusion. Thoracic aorta is nonaneurysmal. Scattered thoracic aortic atherosclerotic calcification. Stable positioning of right-sided chest port. Mediastinum/Nodes: No enlarged mediastinal, hilar, or axillary lymph nodes. Thyroid gland, trachea, and esophagus demonstrate no significant findings. Lungs/Pleura: Centrilobular emphysema. Multiple stable pulmonary nodules including within the left lung apex, right middle lobe, and left lower lobe (series 7, images 30, 118, and 126). These nodules are stable compared to 2016, and benign. Small right pleural effusion including a small amount of pleural fluid within the horizontal fissure. Subtle mosaic attenuation within the right upper lobe is new from prior. No pneumothorax. Upper Abdomen: Postsurgical changes to the stomach and proximal  bowel. No acute findings. Musculoskeletal: No chest wall abnormality. No acute or significant osseous findings. Review of the MIP images confirms the above findings. IMPRESSION: 1. Negative for pulmonary embolism. 2. Subtle mosaic attenuation within the right upper lobe is new from prior and may represent air trapping or bronchiolitis. 3. Small right pleural effusion, also new from prior. 4. Aortic Atherosclerosis (ICD10-I70.0) and Emphysema (ICD10-J43.9). Electronically Signed   By: Davina Poke D.O.   On: 11/19/2019 12:55   DG Chest Portable 1 View  Result Date: 11/18/2019 CLINICAL DATA:  Hypoxia EXAM: PORTABLE CHEST 1 VIEW COMPARISON:  Portable exam 1153 hours compared to 10/12/2019 FINDINGS: RIGHT jugular power port with tip projecting over SVC. Normal heart size, mediastinal contours, and pulmonary vascularity. Emphysematous and bronchitic changes consistent with COPD. No acute infiltrate, pleural effusion, or  pneumothorax. Bones demineralized. IMPRESSION: COPD changes without acute infiltrate. Electronically Signed   By: Lavonia Dana M.D.   On: 11/18/2019 12:35    Scheduled Meds: . amitriptyline  10 mg Oral QHS  . Chlorhexidine Gluconate Cloth  6 each Topical Daily  . enoxaparin (LOVENOX) injection  30 mg Subcutaneous Q24H  . feeding supplement (ENSURE ENLIVE)  237 mL Oral TID BM  . mirtazapine  15 mg Oral QHS  . mometasone-formoterol  2 puff Inhalation BID  . multivitamin with minerals  1 tablet Oral Daily  . nicotine  14 mg Transdermal Daily  . umeclidinium bromide  1 puff Inhalation BID   Continuous Infusions:   LOS: 0 days   Time spent: 35 minutes.  Patrecia Pour, MD [facility] [email or web address] 11/19/2019, 5:40 PM

## 2019-11-19 NOTE — TOC Progression Note (Signed)
Transition of Care [facility]) - Progression Note    Patient Details  Name: Bobby Caldwell MRN: [id number] Date of Birth: 05/17/1956  Transition of Care [facility]) CM/SW [place], [place] Phone Number: 11/19/2019, 9:20 AM  Clinical Narrative:     Butch Penny with [facility] called, notified CSW that pt is active with [facility]/OT/RN       Expected Discharge Plan and Services                                                 Social Determinants of Health (SDOH) Interventions    Readmission Risk Interventions Readmission Risk Prevention Plan 10/15/2019  Transportation Screening Complete  PCP or Specialist Appt within 3-5 Days Complete  HRI or [place] Complete  Social Work Consult for [place] Planning/Counseling Complete  Palliative Care Screening Not Applicable  Medication Review Press photographer) Complete  Some recent data might be hidden

## 2019-11-19 NOTE — Evaluation (Signed)
Physical Therapy Evaluation Patient Details Name: Bobby Caldwell MRN: [id number] DOB: 07-Dec-1955 Today's Date: 11/19/2019   History of Present Illness  64 y.o. male with medical history significant for severe COPD, emphysema, chronic hypoxia on 2 L, stomach adenocarcinoma on chemotherapy, status post partial gastrectomy, peripheral artery disease, tobacco use disorder, recent left rib cage shingles, chronic depression, COVID-19 infection 06/2019 who presented to [facility] ED with generalized weakness thought to be related to hypoxia, poor intake, and recent shingles.  Clinical Impression   Pt presents with generalized weakness, poor standing balance, very decreased activity tolerance, and impaired cognition (unsure of baseline as pt's daughter not present in room). Pt to benefit from acute PT to address deficits. Pt ambulated room distance with external support from both IV pole and PT, pt noticably unsteady but no overt LOB observed. PT recommending HHPT with 24/7 of family, unsure of pt's mobility baseline as no family in room but per pt his family can help him. PT to progress mobility as tolerated, and will continue to follow acutely.   HRmax 111 bpm, SpO2 99-100% on 4LO2    Follow Up Recommendations Home health PT;Supervision/Assistance - 24 hour    Equipment Recommendations  None recommended by PT    Recommendations for Other Services       Precautions / Restrictions Precautions Precautions: Fall Restrictions Weight Bearing Restrictions: No      Mobility  Bed Mobility Overal bed mobility: Needs Assistance Bed Mobility: Supine to Sit     Supine to sit: Mod assist;HOB elevated;+2 for physical assistance;+2 for safety/equipment     General bed mobility comments: Mod +2 for trunk and LE management, scooting to EOB. Suspect difficulty due to combination of weakness and poor understanding of what was being asked of him (even with interpreter)  Transfers Overall transfer level: Needs  assistance Equipment used: 1 person hand held assist Transfers: Sit to/from Stand Sit to Stand: Min assist;From elevated surface;+2 safety/equipment         General transfer comment: Min assist +2 to rise and steady, slow to rise to full standing.  Ambulation/Gait Ambulation/Gait assistance: Min assist;+2 safety/equipment(lines/leads) Gait Distance (Feet): 20 Feet Assistive device: IV Pole Gait Pattern/deviations: Step-through pattern;Decreased stride length;Trunk flexed Gait velocity: decr   General Gait Details: Min assist +2 to steady, lines/leads. Verbal cuing for use of IV pole to steady self as needed, which improved mobility. Pt with brief stand at sink ~30 seconds  Stairs            Wheelchair Mobility    Modified Rankin (Stroke Patients Only)       Balance Overall balance assessment: Needs assistance Sitting-balance support: Feet supported Sitting balance-Leahy Scale: Fair     Standing balance support: Single extremity supported;During functional activity Standing balance-Leahy Scale: Poor Standing balance comment: reliant on SL support and light external assist from PT                             Pertinent Vitals/Pain Pain Assessment: Faces Faces Pain Scale: Hurts little more Pain Location: Points to R ribs, secondary to shingles infection ~3 weeks ago Pain Descriptors / Indicators: Sore;Discomfort;Guarding Pain Intervention(s): Limited activity within patient's tolerance;Monitored during session;Repositioned    Home Living Family/patient expects to be discharged to:: Private residence Living Arrangements: Spouse/significant other;Children(states "yes" when asked if he lives with his daughter) Available Help at Discharge: Family Type of Home: House Home Access: Stairs to enter Entrance Stairs-Rails: Right   Home  Layout: One level Home Equipment: Shower seat Additional Comments: some home setup obtained from recent admit, pt difficult  historian and no family present at bedside    Prior Function Level of Independence: Needs assistance   Gait / Transfers Assistance Needed: Per PT eval 1 month ago, pt ambulatory without AD PTA  ADL's / Homemaking Assistance Needed: unable to state IF he needs help with ADLs, but does state his daughter can help him if needed        Hand Dominance   Dominant Hand: Right    Extremity/Trunk Assessment   Upper Extremity Assessment Upper Extremity Assessment: Defer to OT evaluation    Lower Extremity Assessment Lower Extremity Assessment: Generalized weakness    Cervical / Trunk Assessment Cervical / Trunk Assessment: Other exceptions Cervical / Trunk Exceptions: rounded shoulders  Communication   Communication: Prefers language other than English([place], knows some english)  Cognition Arousal/Alertness: Lethargic;Suspect due to medications(drowsy secondary to pain medications, per RN Josh) Behavior During Therapy: [facility] for tasks assessed/performed Overall Cognitive Status: Difficult to assess Area of Impairment: Attention;Following commands;Safety/judgement;Problem solving                   Current Attention Level: Sustained   Following Commands: Follows one step commands inconsistently;Follows one step commands with increased time Safety/Judgement: Decreased awareness of safety;Decreased awareness of deficits   Problem Solving: Slow processing;Decreased initiation;Difficulty sequencing;Requires verbal cues;Requires tactile cues General Comments: Pt sleeping upon arrival to room, very difficult to wake pt. Pt is very limited in responses to PT/OT, even with [place] phone interpreter. Pt responds to many questions with laughter only. Pt with significant difficulty sequencing mobility tasks, especially bed mobility, requiring both tactile and verbal cuing to complete.      General Comments General comments (skin integrity, edema, etc.): SpO2 reading inaccurate during  mobility, but when accurate pleth pt satting 99-100% on 4LO2. HRmax 111 bpm. Interpreter utilized via [employer] (phone call only for [place]) Bobby Caldwell [id number]    Exercises     Assessment/Plan    PT Assessment Patient needs continued PT services  PT Problem List Decreased strength;Decreased mobility;Decreased safety awareness;Decreased cognition;Decreased activity tolerance;Decreased balance;Pain;Cardiopulmonary status limiting activity       PT Treatment Interventions DME instruction;Therapeutic activities;Gait training;Therapeutic exercise;Patient/family education;Balance training;Stair training;Functional mobility training;Neuromuscular re-education    PT Goals (Current goals can be found in the Care Plan section)  Acute Rehab PT Goals Patient Stated Goal: none stated PT Goal Formulation: With patient Time For Goal Achievement: 12/03/19 Potential to Achieve Goals: Fair    Frequency Min 3X/week   Barriers to discharge        Co-evaluation PT/OT/SLP Co-Evaluation/Treatment: Yes Reason for Co-Treatment: To address functional/ADL transfers;For patient/therapist safety PT goals addressed during session: Mobility/safety with mobility;Balance         AM-PAC PT "6 Clicks" Mobility  Outcome Measure Help needed turning from your back to your side while in a flat bed without using bedrails?: A Lot Help needed moving from lying on your back to sitting on the side of a flat bed without using bedrails?: A Lot Help needed moving to and from a bed to a chair (including a wheelchair)?: A Lot Help needed standing up from a chair using your arms (e.g., wheelchair or bedside chair)?: A Little Help needed to walk in hospital room?: A Little Help needed climbing 3-5 steps with a railing? : A Little 6 Click Score: 15    End of Session Equipment Utilized During Treatment: Gait belt;Oxygen Activity Tolerance:  Patient limited by fatigue Patient left: in chair;with chair alarm set;with call  bell/phone within reach Nurse Communication: Mobility status PT Visit Diagnosis: Other abnormalities of gait and mobility (R26.89);Muscle weakness (generalized) (M62.81)    Time: [id number] PT Time Calculation (min) (ACUTE ONLY): 22 min   Charges:   PT Evaluation $PT Eval Low Complexity: 1 Low         Justyce Baby E, PT Acute Rehabilitation Services Pager [phone]  Office [phone]  Ademola Vert D Sheamus Hasting 11/19/2019, 11:19 AM

## 2019-11-19 NOTE — Progress Notes (Signed)
Pt is sleeping.  He wakes up when stimulated.  Dulera used with poor inspiratory technique.  RR 14 bpm, HR 101, Sat 100

## 2019-11-19 NOTE — Progress Notes (Signed)
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  GOAL:   Patient will meet greater than or equal to 90% of their needs  MONITOR:   Supplement acceptance, PO intake, Labs, Weight trends, I & O's  REASON FOR ASSESSMENT:   Consult Assessment of nutrition requirement/status  ASSESSMENT:   64 year old male who presented with generalized weakness. 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   Able to meet with pt briefly and complete NFPE. Unable to obtain history at this time as pt was about to be transferred to [place]. Will re-attempt at follow-up.  Reviewed weight history in chart. Pt with a 10 kg weight loss over the last year. This is an 18.4% weight loss which is not significant for timeframe but is concerning. Based on results of NFPE, pt meets criteria for severe malnutrition.  Medications reviewed and include: Ensure Enlive BID, Remeron, MVI with minerals IVF: NS @ 75 ml/hr  Labs reviewed: sodium 134, phosphorus 2.2  NUTRITION - FOCUSED PHYSICAL EXAM:    Most Recent Value  Orbital Region  Severe depletion  Upper Arm Region  Severe depletion  Thoracic and Lumbar Region  Severe depletion  Buccal Region  Severe depletion  Temple Region  Severe depletion  Clavicle Bone Region  Severe depletion  Clavicle and Acromion Bone Region  Severe depletion  Scapular Bone Region  Severe depletion   Dorsal Hand  Moderate depletion  Patellar Region  Severe depletion  Anterior Thigh Region  Severe depletion  Posterior Calf Region  Severe depletion  Edema (RD Assessment)  None  Hair  Reviewed  Eyes  Reviewed  Mouth  Reviewed  Skin  Reviewed  Nails  Reviewed       Diet Order:   Diet Order            Diet regular Room service appropriate? Yes; Fluid consistency: Thin  Diet effective now              EDUCATION NEEDS:   Not appropriate for education at this time  Skin:  Skin Assessment: Reviewed RN Assessment  Last BM:  no documented BM  Height:   Ht Readings from Last 1 Encounters:  11/18/19 5\' 5"  (1.651 m)    Weight:   Wt Readings from Last 1 Encounters:  11/18/19 44.4 kg    Ideal Body Weight:  61.8 kg  BMI:  Body mass index is 16.29 kg/m.  Estimated Nutritional Needs:   Kcal:  1350-1550  Protein:  65-75 grams  Fluid:  1.4-1.6 L    Gaynell Face, MS, RD, LDN Inpatient Clinical Dietitian Pager: [phone] Weekend/After Hours: [phone]

## 2019-11-19 NOTE — Evaluation (Signed)
Occupational Therapy Evaluation Patient Details Name: Bobby Caldwell MRN: [id number] DOB: September 04, 1955 Today's Date: 11/19/2019    History of Present Illness 64 y.o. 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   Clinical Impression   Pt presents with impaired ADLs and ADL mobility with deficits in strength, balance and endurance and impaired cognition (unsure of baseline as pt's daughter not present in room). Pt required max A to don socks with verbal and physical cues to initiate. Pt unable to follow commands to wash face with physical cues/prompts. Pt laughed many times after being asked questions by interpreter. Pt stood at sink with min guard A but unable to follow commnds to turn on water to wash hands. Pt would benefit from acute OT services to address impairments to maximize level of function and safety. HRmax 111 bpm, SpO2 99-100% on 4LO2    Follow Up Recommendations  Home health OT;Supervision/Assistance - 24 hour    Equipment Recommendations  Tub/shower seat    Recommendations for Other Services       Precautions / Restrictions Precautions Precautions: Fall Restrictions Weight Bearing Restrictions: No      Mobility Bed Mobility Overal bed mobility: Needs Assistance Bed Mobility: Supine to Sit     Supine to sit: Mod assist;HOB elevated;+2 for physical assistance;+2 for safety/equipment     General bed mobility comments: Mod +2 for trunk and LE management, scooting to EOB. Suspect difficulty due to combination of weakness and poor understanding of what was being asked of him (even with interpreter)  Transfers Overall  transfer level: Needs assistance Equipment used: 1 person hand held assist Transfers: Sit to/from Stand Sit to Stand: Min assist;From elevated surface;+2 safety/equipment         General transfer comment: Min assist +2 to rise and steady, slow to rise to full standing.    Balance Overall balance assessment: Needs assistance Sitting-balance support: Feet supported Sitting balance-Leahy Scale: Fair     Standing balance support: Single extremity supported;During functional activity Standing balance-Leahy Scale: Poor Standing balance comment: reliant on SL support and light external assist from PT                           ADL either performed or assessed with clinical judgement   ADL                                         General ADL Comments: max A to don socks, did not follow commands to wash face or to dress UB. Uncertain of baseline at this time     Vision Baseline Vision/History: Wears glasses Patient Visual Report: (unable to properlt assess even using interpreter)       Perception     Praxis      Pertinent Vitals/Pain Pain Assessment: Faces Faces Pain Scale: Hurts little more Pain Location: Points to R ribs, secondary to shingles infection ~3 weeks ago Pain Descriptors / Indicators: Sore;Discomfort;Guarding Pain Intervention(s): Limited activity within patient's tolerance;Monitored during session;Repositioned     Hand Dominance Right   Extremity/Trunk Assessment Upper Extremity Assessment Upper Extremity Assessment:  Generalized weakness   Lower Extremity Assessment Lower Extremity Assessment: Defer to PT evaluation   Cervical / Trunk Assessment Cervical / Trunk Assessment: Other exceptions Cervical / Trunk Exceptions: rounded shoulders   Communication Communication Communication: Prefers language other than [place];Other (comment)(Speaks [place]?)   Cognition Arousal/Alertness: Lethargic;Suspect due to  medications Behavior During Therapy: [facility] for tasks assessed/performed Overall Cognitive Status: Difficult to assess Area of Impairment: Attention;Following commands;Safety/judgement;Problem solving                   Current Attention Level: Sustained   Following Commands: Follows one step commands inconsistently;Follows one step commands with increased time Safety/Judgement: Decreased awareness of safety;Decreased awareness of deficits   Problem Solving: Slow processing;Decreased initiation;Difficulty sequencing;Requires verbal cues;Requires tactile cues General Comments: Pt sleeping upon arrival to room, very difficult to wake pt. Pt is very limited in responses to PT/OT, even with [place] phone interpreter. Pt responds to many questions with laughter only. Pt with significant difficulty sequencing mobility tasks, especially bed mobility, requiring both tactile and verbal cuing to complete.   General Comments  SpO2 reading inaccurate during mobility, but when accurate pleth pt satting 99-100% on 4LO2. HRmax 111 bpm. Interpreter utilized via [employer] (phone call only for [place]) Jeralene Huff [phone]    Exercises     Shoulder Instructions      Home Living Family/patient expects to be discharged to:: Private residence Living Arrangements: Spouse/significant other;Children Available Help at Discharge: Family Type of Home: House Home Access: Stairs to enter Technical brewer of Steps: 4 Entrance Stairs-Rails: Right Home Layout: One level     Bathroom Shower/Tub: Tub/shower unit         Home Equipment: Shower seat   Additional Comments: some home setup obtained from recent admit, pt difficult historian and no family present at bedside      Prior Functioning/Environment Level of Independence: Needs assistance  Gait / Transfers Assistance Needed: Per PT eval 1 month ago, pt ambulatory without AD PTA ADL's / Homemaking Assistance Needed: unable to state IF he needs help with  ADLs, but does state his daughter can help him if needed            OT Problem List: Decreased strength;Impaired balance (sitting and/or standing);Decreased cognition;Pain;Decreased safety awareness;Decreased activity tolerance;Decreased coordination;Decreased knowledge of use of DME or AE      OT Treatment/Interventions: Self-care/ADL training;Therapeutic exercise;Neuromuscular education;Therapeutic activities;DME and/or AE instruction    OT Goals(Current goals can be found in the care plan section) Acute Rehab OT Goals Patient Stated Goal: none stated OT Goal Formulation: Patient unable to participate in goal setting Time For Goal Achievement: 12/03/19 Potential to Achieve Goals: Good ADL Goals Pt Will Perform Grooming: with min guard assist;standing Pt Will Perform Upper Body Bathing: with min assist Pt Will Perform Lower Body Bathing: with min assist Pt Will Perform Upper Body Dressing: with min assist Pt Will Perform Lower Body Dressing: with min assist Pt Will Transfer to Toilet: with min assist;with min guard assist;ambulating Pt Will Perform Toileting - Clothing Manipulation and hygiene: with min assist;with min guard assist;sit to/from stand  OT Frequency: Min 2X/week   Barriers to D/C:            Co-evaluation PT/OT/SLP Co-Evaluation/Treatment: Yes Reason for Co-Treatment: Complexity of the patient's impairments (multi-system involvement);For patient/therapist safety PT goals addressed during session: Mobility/safety with mobility;Balance OT goals addressed during session: ADL's and self-care      AM-PAC OT "6 Clicks" Daily Activity     Outcome Measure Help  from another person eating meals?: A Little Help from another person taking care of personal grooming?: A Lot Help from another person toileting, which includes using toliet, bedpan, or urinal?: A Lot Help from another person bathing (including washing, rinsing, drying)?: A Lot Help from another person to put  on and taking off regular upper body clothing?: A Lot Help from another person to put on and taking off regular lower body clothing?: A Lot 6 Click Score: 13   End of Session Equipment Utilized During Treatment: Gait belt;Oxygen Nurse Communication: Mobility status  Activity Tolerance: Patient tolerated treatment well Patient left: in chair;with call bell/phone within reach;with chair alarm set  OT Visit Diagnosis: Unsteadiness on feet (R26.81);Other abnormalities of gait and mobility (R26.89);Muscle weakness (generalized) (M62.81);Other symptoms and signs involving cognitive function;Pain Pain - Right/Left: Left Pain - part of body: (rib area)                Time: [id number] OT Time Calculation (min): 22 min Charges:  OT General Charges $OT Visit: 1 Visit OT Evaluation $OT Eval Low Complexity: 1 Low    Britt Bottom 11/19/2019, 12:53 PM

## 2019-11-20 MED ORDER — ONDANSETRON HCL 4 MG PO TABS: 4.0000 mg | ORAL_TABLET | Freq: Three times a day (TID) | ORAL | 0 refills | Status: DC | PRN

## 2019-11-20 NOTE — TOC Transition Note (Addendum)
Transition of Care [facility]) - CM/SW Discharge Note   Patient Details  Name: Keltyn Ratley MRN: [id number] Date of Birth: 06-Oct-1955  [facility] CM/SW Contact:  Bethann Berkshire, [place] Phone Number: 11/20/2019, 9:10 AM   Clinical Narrative:     Pt from home with spouse and 2 children. CSW confirmed with pt that he is already active with [facility] and is okay with continuing with them. CS[facility] at [facility] that [facility] PT/OT/RN. Pt stated he didn't need a shower stool as he already had one. No other needs identified.   TOC sign off for now.   22 Called daughter [name] and confirmed that pt is active with HH and will continue with [facility]. Daughter said they will be picking pt up around 1530.     Final next level of care: Home w Home Health Services Barriers to Discharge: No Barriers Identified   Patient Goals and CMS Choice Patient states their goals for this hospitalization and ongoing recovery are:: Return home      Discharge Placement                       Discharge Plan and Services                            [place]: [place] Date [facility] Agency Contacted: 11/20/19 Time [place]: [id number] Representative spoke with at [place]: Already established. Spoke with Butch Penny  Social Determinants of Health (SDOH) Interventions     Readmission Risk Interventions Readmission Risk Prevention Plan 10/15/2019  Transportation Screening Complete  PCP or Specialist Appt within 3-5 Days Complete  HRI or Convoy Complete  Social Work Consult for [place] Planning/Counseling Complete  Palliative Care Screening Not Applicable  Medication Review Press photographer) Complete  Some recent data might be hidden

## 2019-11-20 NOTE — Discharge Summary (Signed)
Physician Discharge Summary  Bobby Caldwell [id number] DOB: 1956/06/26 DOA: 11/18/2019  PCP: [name], MD  Admit date: 11/18/2019 Discharge date: 11/20/2019  Admitted From: Home Disposition: Home   Recommendations for Outpatient Follow-up:  1. Follow up with PCP in 1-2 weeks 2. Please obtain BMP/CBC in one week 3. Consider palliative care consultation.   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 He was also generally weak the day following the 2nd covid vaccine. In the ED he was found to be febrile with T-max of 100.9, tachycardicHR122, tachypneic RRof 29. O2 saturation 98% on 2 L. Was started on IV vancomycin and Zosyn in the ED. Chest x-ray no evidence of pulmonary infiltrates or edema. Subsequent CTA chest revealed no pulmonary emboli.  Discharge Diagnoses:  Active Problems:   Acute respiratory failure with hypoxia (HCC)   Weakness  Generalized weakness: Multifactorial due to hypoxia,dehydration from poor oral intake,recent shingles, severe COPD/emphysema, and malnutrition. - PT/OT recommending home health which will be arranged at discharge.  - Continue treating conditions as below  SIRS,unclear etiology: No evidence of active infectious process, PCT negative.  Presented with fever T-max 100.12F, tachycardia,tachypnea,generalized weakness - Monitor blood cultures (pending without growth at time of discharge)  Acute on chronic hypoxic respiratory failure in the setting of severe COPD/emphysema: Partially due to nonadherence. Also possibly related to air trapping or  bronchiolitis and small right pleural effusion. - Continue supplemental oxygen, appears to be back to baseline. - Incentive spirometry.  - Bronchodilators  Dehydration from poor oral intake, nausea, constipation: Nonobstructive bowel gas pattern. - Bowel regimen - Encourage po intake.   Postherpetic neuralgia: Recent diagnosis, all lesions crusted, no active lesions.  - Resume amitriptylineand oxycodone PRN  Severe protein calorie malnutrition - Protein supplement  Stomach cancer s/p partial gastrectomy and chemotherapy:  - Follows with Dr. Burr Medico. Recommend following up soon.  Tobacco use disorder - Cessation recommended - While admitted received Nicotine patch  Discharge Instructions Discharge Instructions    Diet - low sodium heart healthy   Complete by: As directed    Discharge instructions   Complete by: As directed    You were admitted for weakness and low oxygen saturations. Work up has shown no evidence of pneumonia, blood clots in the lungs, fluid in the lungs. With supportive care you've improved and are stable for discharge. You will need to follow up with Dr. Burr Medico and/or your pulmonologist in the next week or seek medical attention if your symptoms return. Continue wearing oxygen at 2L at home. A new prescription for zofran to assist with nausea was sent to your pharmacy.   Increase activity slowly   Complete by: As directed      Allergies as of 11/20/2019   No Known Allergies     Medication List    STOP taking these medications   feeding supplement (PRO-STAT SUGAR FREE 64) Liqd     TAKE these medications   amitriptyline 10 MG tablet Commonly known as: ELAVIL Take 1 tablet (10 mg total) by mouth at bedtime.   Breztri Aerosphere 160-9-4.8 MCG/ACT Aero Generic drug: Budeson-Glycopyrrol-Formoterol Inhale 2 puffs into the lungs 2 (two) times daily.   budesonide-formoterol  160-4.5 MCG/ACT inhaler Commonly known as: Symbicort Inhale 2 puffs into the  lungs 2 (two) times daily.   feeding supplement (ENSURE ENLIVE) Liqd Take 237 mLs by mouth 2 (two) times daily between meals.   ipratropium-albuterol 0.5-2.5 (3) MG/3ML Soln Commonly known as: DUONEB Inhale 3 mLs into the lungs 4 (four) times daily. Dx:J44.9   mirtazapine 15 MG tablet Commonly known as: Remeron Take 1 tablet (15 mg total) by mouth at bedtime.   multivitamin with minerals Tabs tablet Take 1 tablet by mouth daily.   ondansetron 4 MG tablet Commonly known as: Zofran Take 1 tablet (4 mg total) by mouth every 8 (eight) hours as needed for nausea or vomiting.   oxyCODONE-acetaminophen 7.5-325 MG tablet Commonly known as: Percocet Take 1 tablet by mouth every 6 (six) hours as needed for severe pain.   OXYGEN Inhale 2 L into the lungs continuous.   Spiriva Respimat 2.5 MCG/ACT Aers Generic drug: Tiotropium Bromide Monohydrate Inhale 2 puffs into the lungs daily.      Follow-up Information    [name], MD. Schedule an appointment as soon as possible for a visit in 1 week(s).   Specialties: Hematology, Oncology Contact information: [place] [address] [phone]          No Known Allergies  Consultations:  None  Procedures/Studies: DG Abd 1 View  Result Date: 11/18/2019 CLINICAL DATA:  Abdominal pain and vomiting EXAM: ABDOMEN - 1 VIEW COMPARISON:  None. FINDINGS: The bowel gas pattern is normal. There is a moderate amount of colonic stool. Vascular stent is seen overlying the left iliac vasculature. The lung bases are clear. No radio-opaque calculi or other significant radiographic abnormality are seen. IMPRESSION: Nonobstructive bowel gas pattern.  Moderate amount of colonic stool. Electronically Signed   By: Prudencio Pair M.D.   On: 11/18/2019 22:31   CT ANGIO CHEST PE W OR WO CONTRAST  Result Date: 11/19/2019 CLINICAL DATA:  Shortness of breath, tachycardia, hypoxemia EXAM: CT ANGIOGRAPHY CHEST WITH CONTRAST TECHNIQUE:  Multidetector CT imaging of the chest was performed using the standard protocol during bolus administration of intravenous contrast. Multiplanar CT image reconstructions and MIPs were obtained to evaluate the vascular anatomy. CONTRAST:  68mL OMNIPAQUE IOHEXOL 350 MG/ML SOLN COMPARISON:  09/10/2019, 02/22/2015 FINDINGS: Cardiovascular: Satisfactory opacification of the pulmonary arteries to the segmental level. No evidence of pulmonary embolism. Normal heart size. No pericardial effusion. Thoracic aorta is nonaneurysmal. Scattered thoracic aortic atherosclerotic calcification. Stable positioning of right-sided chest port. Mediastinum/Nodes: No enlarged mediastinal, hilar, or axillary lymph nodes. Thyroid gland, trachea, and esophagus demonstrate no significant findings. Lungs/Pleura: Centrilobular emphysema. Multiple stable pulmonary nodules including within the left lung apex, right middle lobe, and left lower lobe (series 7, images 30, 118, and 126). These nodules are stable compared to 2016, and benign. Small right pleural effusion including a small amount of pleural fluid within the horizontal fissure. Subtle mosaic attenuation within the right upper lobe is new from prior. No pneumothorax. Upper Abdomen: Postsurgical changes to the stomach and proximal bowel. No acute findings. Musculoskeletal: No chest wall abnormality. No acute or significant osseous findings. Review of the MIP images confirms the above findings. IMPRESSION: 1. Negative for pulmonary embolism. 2. Subtle mosaic attenuation within the right upper lobe is new from prior and may represent air trapping or bronchiolitis. 3. Small right pleural effusion, also new from prior. 4. Aortic Atherosclerosis (ICD10-I70.0) and Emphysema (ICD10-J43.9). Electronically Signed   By: Davina Poke D.O.   On: 11/19/2019  12:55   DG Chest Portable 1 View  Result Date: 11/18/2019 CLINICAL DATA:  Hypoxia EXAM: PORTABLE CHEST 1 VIEW COMPARISON:  Portable exam  1153 hours compared to 10/12/2019 FINDINGS: RIGHT jugular power port with tip projecting over SVC. Normal heart size, mediastinal contours, and pulmonary vascularity. Emphysematous and bronchitic changes consistent with COPD. No acute infiltrate, pleural effusion, or pneumothorax. Bones demineralized. IMPRESSION: COPD changes without acute infiltrate. Electronically Signed   By: Lavonia Dana M.D.   On: 11/18/2019 12:35       Subjective: Feels well, wants to go home. Working with PT, stating he's at his baseline functional status, denies chest pain or dyspnea beyond his baseline. Eating normally.   Discharge Exam: Vitals:   11/20/19 1200 11/20/19 1500  BP:  (!) 142/92  Pulse:  (!) 109  Resp:    Temp:  98 F (36.7 C)  SpO2: 100% 100%   General: Cachectic male in no distress Cardiovascular: RRR, S1/S2 +, no rubs, no gallops Respiratory: CTA bilaterally, no wheezing, no rhonchi Abdominal: Soft, NT, ND, bowel sounds + Extremities: No edema, no cyanosis  Labs: BNP (last 3 results) Recent Labs    09/10/19 1241  BNP 123456*   Basic Metabolic Panel: Recent Labs  Lab 11/18/19 0856  NA 134*  K 3.9  CL 93*  CO2 33*  GLUCOSE 83  BUN 5*  CREATININE 0.62  CALCIUM 8.4*   Liver Function Tests: No results for input(s): AST, ALT, ALKPHOS, BILITOT, PROT, ALBUMIN in the last 168 hours. No results for input(s): LIPASE, AMYLASE in the last 168 hours. No results for input(s): AMMONIA in the last 168 hours. CBC: Recent Labs  Lab 11/18/19 0856  WBC 7.0  HGB 13.7  HCT 44.1  MCV 86.5  PLT 252   Cardiac Enzymes: No results for input(s): CKTOTAL, CKMB, CKMBINDEX, TROPONINI in the last 168 hours. BNP: Invalid input(s): POCBNP CBG: Recent Labs  Lab 11/18/19 1151  GLUCAP 89   D-Dimer No results for input(s): DDIMER in the last 72 hours. Hgb A1c No results for input(s): HGBA1C in the last 72 hours. Lipid Profile No results for input(s): CHOL, HDL, LDLCALC, TRIG, CHOLHDL,  LDLDIRECT in the last 72 hours. Thyroid function studies No results for input(s): TSH, T4TOTAL, T3FREE, THYROIDAB in the last 72 hours.  Invalid input(s): FREET3 Anemia work up No results for input(s): VITAMINB12, FOLATE, FERRITIN, TIBC, IRON, RETICCTPCT in the last 72 hours. Urinalysis    Component Value Date/Time   COLORURINE YELLOW 10/12/2019 1024   APPEARANCEUR CLEAR 10/12/2019 1024   LABSPEC 1.014 10/12/2019 1024   PHURINE 6.0 10/12/2019 1024   GLUCOSEU NEGATIVE 10/12/2019 1024   HGBUR NEGATIVE 10/12/2019 1024   BILIRUBINUR NEGATIVE 10/12/2019 1024   KETONESUR 20 (A) 10/12/2019 1024   PROTEINUR NEGATIVE 10/12/2019 1024   NITRITE NEGATIVE 10/12/2019 1024   LEUKOCYTESUR NEGATIVE 10/12/2019 1024    Microbiology Recent Results (from the past 240 hour(s))  Blood culture (routine x 2)     Status: None (Preliminary result)   Collection Time: 11/18/19  1:10 PM   Specimen: BLOOD  Result Value Ref Range Status   Specimen Description BLOOD LEFT ANTECUBITAL  Final   Special Requests   Final    BOTTLES DRAWN AEROBIC AND ANAEROBIC Blood Culture adequate volume   Culture   Final    NO GROWTH 2 DAYS Performed at [facility], [place] [address], [place], [place] [address]    Report Status PENDING  Incomplete  Blood culture (routine x 2)  Status: None (Preliminary result)   Collection Time: 11/18/19  4:30 PM   Specimen: BLOOD RIGHT ARM  Result Value Ref Range Status   Specimen Description BLOOD RIGHT ARM  Final   Special Requests   Final    BOTTLES DRAWN AEROBIC AND ANAEROBIC Blood Culture adequate volume   Culture   Final    NO GROWTH 2 DAYS Performed at [facility], [address], [place], [place] [address]    Report Status PENDING  Incomplete  SARS Coronavirus 2 by RT PCR (hospital order, performed in [facility] hospital lab) Nasopharyngeal Nasopharyngeal Swab     Status: None   Collection Time: 11/18/19  5:44 PM   Specimen: Nasopharyngeal Swab  Result Value  Ref Range Status   SARS Coronavirus 2 NEGATIVE NEGATIVE Final    Comment: (NOTE) SARS-CoV-2 target nucleic acids are NOT DETECTED. The SARS-CoV-2 RNA is generally detectable in upper and lower respiratory specimens during the acute phase of infection. The lowest concentration of SARS-CoV-2 viral copies this assay can detect is 250 copies / mL. A negative result does not preclude SARS-CoV-2 infection and should not be used as the sole basis for treatment or other patient management decisions.  A negative result may occur with improper specimen collection / handling, submission of specimen other than nasopharyngeal swab, presence of viral mutation(s) within the areas targeted by this assay, and inadequate number of viral copies (<250 copies / mL). A negative result must be combined with clinical observations, patient history, and epidemiological information. Fact Sheet for Patients:   StrictlyIdeas.no Fact Sheet for Healthcare Providers: BankingDealers.co.za This test is not yet approved or cleared  by the [place] FDA and has been authorized for detection and/or diagnosis of SARS-CoV-2 by FDA under an Emergency Use Authorization (EUA).  This EUA will remain in effect (meaning this test can be used) for the duration of the COVID-19 declaration under Section 564(b)(1) of the Act, 21 U.S.C. section 360bbb-3(b)(1), unless the authorization is terminated or revoked sooner. Performed at [facility], [place] [address], [place], [place] [address]     Time coordinating discharge: Approximately 40 minutes  Patrecia Pour, MD  [facility] 11/20/2019, 5:58 PM

## 2019-11-20 NOTE — Progress Notes (Signed)
Occupational Therapy Treatment Patient Details Name: Bobby Caldwell MRN: [id number] DOB: 07/07/1956 Today's Date: 11/20/2019    History of present illness 64 y.o. male with medical history significant for severe COPD, emphysema, chronic hypoxia on 2 L, stomach adenocarcinoma on chemotherapy, status post partial gastrectomy, peripheral artery disease, tobacco use disorder, recent left rib cage shingles, chronic depression, COVID-19 infection 06/2019 who presented to [facility] ED with generalized weakness thought to be related to hypoxia, poor intake, and recent shingles.   OT comments  Patient continues to make minimal progress towards goals in skilled OT session. Patient's session encompassed assessment of cognitive skills and attempts to progress with ADLs to facilitate functional independence. Pt with increased confusion in session, and went prompted for date of birth, kept repeating name over and over again. Pt was able to sit in the middle of the bed, however would not follow commands to progress towards EOB or participate in ADLs stating (per interpreter) "The doctor said I could go home, you've saved my life, God bless you" when prompted to complete any basic functional task. RN notified of current functional status; discharge remains appropriate, however it is imperative that pt have 24 hour supervision due to cognitive status. Will continue to follow acutely.    Follow Up Recommendations  Home health OT;Supervision/Assistance - 24 hour    Equipment Recommendations  Tub/shower seat    Recommendations for Other Services      Precautions / Restrictions Precautions Precautions: Fall Restrictions Weight Bearing Restrictions: No       Mobility Bed Mobility Overal bed mobility: Needs Assistance Bed Mobility: Supine to Sit     Supine to sit: Supervision     General bed mobility comments: Able to sit up independently in the middle of the bed, however went to get out of bed on the side with  the bed rail still elevated, despite tactile and verbal cues, pt was unable to follow commands to sit EOB with therapist and interpreter prompting  Transfers                      Balance Overall balance assessment: Needs assistance   Sitting balance-Leahy Scale: Fair Sitting balance - Comments: Only sat in the middle of the bed                                   ADL either performed or assessed with clinical judgement   ADL Overall ADL's : Needs assistance/impaired       Grooming Details (indicate cue type and reason): Attempted grooming tasks, was unable to complete with interpreter kept saying "my back my back, God bless you, thank you for saving my life"                               General ADL Comments: Was unable to follow commands to date, could state name, but could not state DOB, kept repeating his name over and over again     Vision Baseline Vision/History: Wears glasses     Perception     Praxis      Cognition Arousal/Alertness: Awake/alert Behavior During Therapy: WFL for tasks assessed/performed;Flat affect Overall Cognitive Status: Difficult to assess Area of Impairment: Attention;Following commands;Safety/judgement;Problem solving;Orientation                 Orientation Level: Disoriented to;Place;Time Current Attention Level: Sustained  Following Commands: Follows one step commands inconsistently Safety/Judgement: Decreased awareness of safety;Decreased awareness of deficits   Problem Solving: Slow processing;Decreased initiation;Difficulty sequencing;Requires verbal cues;Requires tactile cues General Comments: Pt sleeping upon arrival, however much easier to arouse, except for sitting up in bed, patient was unable to follow any commands to assess sequencing and safety upon discharge home        Exercises     Shoulder Instructions       General Comments      Pertinent Vitals/ Pain       Pain  Assessment: Faces Faces Pain Scale: Hurts whole lot Pain Location: "my back my back" Pain Descriptors / Indicators: Sore;Discomfort;Guarding Pain Intervention(s): Limited activity within patient's tolerance;Monitored during session;Repositioned  Home Living                                          Prior Functioning/Environment              Frequency  Min 2X/week        Progress Toward Goals  OT Goals(current goals can now be found in the care plan section)  Progress towards OT goals: Not progressing toward goals - comment(Was unable to follow any commands to assess ability to date)  Acute Rehab OT Goals Patient Stated Goal: none stated OT Goal Formulation: Patient unable to participate in goal setting Time For Goal Achievement: 12/03/19 Potential to Achieve Goals: Good  Plan Discharge plan remains appropriate    Co-evaluation                 AM-PAC OT "6 Clicks" Daily Activity     Outcome Measure   Help from another person eating meals?: A Little Help from another person taking care of personal grooming?: A Lot Help from another person toileting, which includes using toliet, bedpan, or urinal?: A Lot Help from another person bathing (including washing, rinsing, drying)?: A Lot Help from another person to put on and taking off regular upper body clothing?: A Lot Help from another person to put on and taking off regular lower body clothing?: A Lot 6 Click Score: 13    End of Session Equipment Utilized During Treatment: Oxygen  OT Visit Diagnosis: Unsteadiness on feet (R26.81);Other abnormalities of gait and mobility (R26.89);Muscle weakness (generalized) (M62.81);Other symptoms and signs involving cognitive function;Pain Pain - part of body: (Back)   Activity Tolerance Patient limited by fatigue;Other (comment)(Therapy limited by confusion)   Patient Left with call bell/phone within reach;in bed;with bed alarm set   Nurse Communication  Mobility status        Time: [id number] OT Time Calculation (min): 17 min  Charges: OT General Charges $OT Visit: 1 Visit OT Treatments $Self Care/Home Management : 8-22 mins  Corinne Ports E. Michaelah Credeur, COTA/L Acute Rehabilitation Services [place] 11/20/2019, 10:37 AM

## 2019-11-20 NOTE — Progress Notes (Signed)
Discharge instructions given. Patient and daughter verbalized understanding and all questions were answered.

## 2019-11-21 ENCOUNTER — Telehealth: Payer: Self-pay | Admitting: Pulmonary Disease

## 2019-11-21 NOTE — Telephone Encounter (Signed)
Please advise if ok to give VO to continue PT and also advise if you want to order pt a power wheelchair thanks

## 2019-11-23 LAB — CULTURE, BLOOD (ROUTINE X 2)
Culture: NO GROWTH
Culture: NO GROWTH
Special Requests: ADEQUATE
Special Requests: ADEQUATE

## 2019-11-25 ENCOUNTER — Other Ambulatory Visit: Payer: Self-pay

## 2019-11-25 NOTE — Telephone Encounter (Signed)
Kelly from [place] to get verbal orders for home health 1 week x 3 . Claiborne Billings can be reached at [phone].

## 2019-11-25 NOTE — Telephone Encounter (Signed)
Kelly from [place] to get verbal orders for home health 1 week x 3 . Claiborne Billings can be reached at [phone].  DR.Olalere can you please advise. Thank you

## 2019-11-25 NOTE — Telephone Encounter (Signed)
I am okay for verbal order for PT needs and also okay for power wheelchair  Patient needs to have a primary care doctor to address the above needs as they come forward

## 2019-12-01 ENCOUNTER — Inpatient Hospital Stay: Payer: Medicare Other | Attending: Hematology

## 2019-12-02 ENCOUNTER — Telehealth: Payer: Self-pay | Admitting: Pulmonary Disease

## 2019-12-02 NOTE — Telephone Encounter (Signed)
Spoke with patient's daughter. She stated that she was calling to see AO could call in more pain medication for him since he is still dealing with the pain from shingles. I advised her that based on the last OV note she needs to contact primary care. She verbalized understanding.   Nothing further needed.

## 2019-12-05 NOTE — Telephone Encounter (Signed)
Called and spoke with Claiborne Billings letting her know that Dr. Jenetta Downer was fine with [place] giving verbal orders for pt. She verbalized understanding. Nothing further needed.

## 2019-12-10 ENCOUNTER — Inpatient Hospital Stay: Payer: Medicare Other | Attending: Hematology

## 2019-12-10 ENCOUNTER — Other Ambulatory Visit: Payer: Self-pay

## 2019-12-10 DIAGNOSIS — Z95828 Presence of other vascular implants and grafts: Secondary | ICD-10-CM

## 2019-12-10 DIAGNOSIS — Z452 Encounter for adjustment and management of vascular access device: Secondary | ICD-10-CM | POA: Diagnosis present

## 2019-12-10 DIAGNOSIS — J449 Chronic obstructive pulmonary disease, unspecified: Secondary | ICD-10-CM | POA: Diagnosis not present

## 2019-12-10 DIAGNOSIS — R0602 Shortness of breath: Secondary | ICD-10-CM | POA: Diagnosis not present

## 2019-12-10 DIAGNOSIS — R1012 Left upper quadrant pain: Secondary | ICD-10-CM | POA: Insufficient documentation

## 2019-12-10 DIAGNOSIS — F17210 Nicotine dependence, cigarettes, uncomplicated: Secondary | ICD-10-CM | POA: Diagnosis not present

## 2019-12-10 DIAGNOSIS — Z79899 Other long term (current) drug therapy: Secondary | ICD-10-CM | POA: Diagnosis not present

## 2019-12-10 DIAGNOSIS — C169 Malignant neoplasm of stomach, unspecified: Secondary | ICD-10-CM

## 2019-12-10 DIAGNOSIS — B0229 Other postherpetic nervous system involvement: Secondary | ICD-10-CM | POA: Insufficient documentation

## 2019-12-10 MED ORDER — SODIUM CHLORIDE 0.9% FLUSH
10.0000 mL | Freq: Once | INTRAVENOUS | Status: AC
  Administered 2019-12-10: 10 mL
  Filled 2019-12-10: qty 10

## 2019-12-10 MED ORDER — HEPARIN SOD (PORK) LOCK FLUSH 100 UNIT/ML IV SOLN
500.0000 [IU] | Freq: Once | INTRAVENOUS | Status: AC
  Administered 2019-12-10: 500 [IU]
  Filled 2019-12-10: qty 5

## 2019-12-15 ENCOUNTER — Telehealth: Payer: Self-pay | Admitting: Pulmonary Disease

## 2019-12-15 NOTE — Telephone Encounter (Signed)
Called and spoke with Claiborne Billings, [facility].  Kelly requested verbal orders for nursing to continue to visit Patient at home 1 time per week x's 9 weeks.  Message routed to Dr Ander Slade

## 2019-12-16 NOTE — Telephone Encounter (Signed)
Agree with continuing nursing visits once a week

## 2019-12-16 NOTE — Telephone Encounter (Signed)
Verbal order to [facility] provided. Dr. Ander Slade agrees that patient should have nursing visits, once a week, for nine more weeks.

## 2019-12-18 ENCOUNTER — Encounter: Payer: Self-pay | Admitting: Pulmonary Disease

## 2019-12-18 ENCOUNTER — Other Ambulatory Visit: Payer: Self-pay

## 2019-12-18 ENCOUNTER — Ambulatory Visit (INDEPENDENT_AMBULATORY_CARE_PROVIDER_SITE_OTHER): Payer: Medicare Other | Admitting: Pulmonary Disease

## 2019-12-18 VITALS — BP 114/76 | HR 78 | Temp 97.3°F | Ht 65.0 in | Wt 86.6 lb

## 2019-12-18 DIAGNOSIS — J9611 Chronic respiratory failure with hypoxia: Secondary | ICD-10-CM

## 2019-12-18 DIAGNOSIS — J431 Panlobular emphysema: Secondary | ICD-10-CM | POA: Diagnosis not present

## 2019-12-18 MED ORDER — BREZTRI AEROSPHERE 160-9-4.8 MCG/ACT IN AERO: 2.0000 | INHALATION_SPRAY | Freq: Two times a day (BID) | RESPIRATORY_TRACT | 0 refills | Status: DC

## 2019-12-18 MED ORDER — AMITRIPTYLINE HCL 25 MG PO TABS: 25.0000 mg | ORAL_TABLET | Freq: Every day | ORAL | 2 refills | Status: DC

## 2019-12-18 NOTE — Patient Instructions (Addendum)
We will increase your Elavil from 10 mg to 25 mg-this will help with the pain  Breztri refills sent to pharmacy, will provide you with some samples  We will see you in 2 to 3 months  Call with significant concerns

## 2019-12-18 NOTE — Progress Notes (Signed)
Bobby Caldwell    [id number]    Jan 20, 1956  Primary Care Physician:[name], Krista Blue, MD  Referring Physician: No referring provider defined for this encounter.  Chief complaint:   Patient being seen for follow-up for obstructive lung disease Shingles infection Failure is a bit better  HPI: Shingles left rib cage -Still has residual pain  Breathing feels about as good Has been using Breztri  Recently hospitalized for respiratory failure  History of stomach cancer Decreased appetite Cough with shortness of breath Still actively smoking  Has been using oxygen around-the-clock  Has severe COPD/emphysema  Most significant concern today was worsening depression  Decreased appetite Decreased activity levels  Started smoking at age 40, greater than 80-pack-year smoking history currently smoking about half a pack per day Was able to quit for a while but currently back smoking actively  He feels generally well, states he is under a lot of stress and this is contributing to him continuing to smoke actively  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 amitriptyline (ELAVIL) 10 MG tablet Take 1 tablet (10 mg total) by mouth at bedtime.  . Budeson-Glycopyrrol-Formoterol (BREZTRI AEROSPHERE) 160-9-4.8 MCG/ACT AERO Inhale 2 puffs into the lungs 2 (two) times daily.  . budesonide-formoterol (SYMBICORT) 160-4.5 MCG/ACT inhaler Inhale 2 puffs into the lungs 2 (two) times daily.  . feeding supplement, ENSURE ENLIVE, (ENSURE ENLIVE) LIQD Take 237 mLs by mouth 2 (two) times daily between meals.  Marland Kitchen ipratropium-albuterol (DUONEB) 0.5-2.5 (3) MG/3ML SOLN Inhale 3 mLs into the lungs 4 (four) times daily. Dx:J44.9  . mirtazapine (REMERON) 15 MG tablet Take 1 tablet (15 mg  total) by mouth at bedtime.  . Multiple Vitamin (MULTIVITAMIN WITH MINERALS) TABS tablet Take 1 tablet by mouth daily.  . ondansetron (ZOFRAN) 4 MG tablet Take 1 tablet (4 mg total) by mouth every 8 (eight) hours as needed for nausea or vomiting.  Marland Kitchen oxyCODONE-acetaminophen (PERCOCET) 7.5-325 MG tablet Take 1 tablet by mouth every 6 (six) hours as needed for severe pain.  . OXYGEN Inhale 2 L into the lungs continuous.  . Tiotropium Bromide Monohydrate (SPIRIVA RESPIMAT) 2.5 MCG/ACT AERS Inhale 2 puffs into the lungs daily.   No facility-administered encounter medications on file as of 12/18/2019.    Allergies as of 12/18/2019  . (No Known Allergies)    Past Medical History:  Diagnosis Date  . COPD (chronic obstructive pulmonary disease) ([place])   . Dyspnea   . PAD (peripheral artery disease) ([place])   . Stomach cancer ([place])   . Tobacco abuse   . Weight loss     Past Surgical History:  Procedure Laterality Date  . ABDOMINAL AORTOGRAM W/LOWER EXTREMITY N/A 05/03/2017   Procedure: ABDOMINAL AORTOGRAM W/LOWER EXTREMITY;  Surgeon: Conrad Elmira, MD;  Location: [facility];  Service: Cardiovascular;  Laterality: N/A;  . BIOPSY  08/22/2018   Procedure: BIOPSY;  Surgeon: [name], MD;  Location: [facility];  Service: Endoscopy;;  . BIOPSY  07/31/2019   Procedure: BIOPSY;  Surgeon: [name], MD;  Location: [facility];  Service: Endoscopy;;  . ESOPHAGOGASTRODUODENOSCOPY     05/24/17  . ESOPHAGOGASTRODUODENOSCOPY (EGD) WITH PROPOFOL N/A 05/24/2017   Procedure: ESOPHAGOGASTRODUODENOSCOPY (EGD) WITH PROPOFOL;  Surgeon: [name], MD;  Location: [facility];  Service: Endoscopy;  Laterality: N/A;  . ESOPHAGOGASTRODUODENOSCOPY (EGD) WITH PROPOFOL N/A 08/22/2018   Procedure: ESOPHAGOGASTRODUODENOSCOPY (EGD) WITH PROPOFOL;  Surgeon: [name], MD;  Location: [facility];  Service: Endoscopy;  Laterality: N/A;  . ESOPHAGOGASTRODUODENOSCOPY (EGD) WITH PROPOFOL N/A  11/28/2018   Procedure: ESOPHAGOGASTRODUODENOSCOPY (EGD) WITH PROPOFOL;  Surgeon: [name], MD;  [facility]: [facility];  Service: Endoscopy;  Laterality: N/A;  . ESOPHAGOGASTRODUODENOSCOPY (EGD) WITH PROPOFOL N/A 07/31/2019   Procedure: ESOPHAGOGASTRODUODENOSCOPY (EGD) WITH PROPOFOL;  Surgeon: [name], MD;  Location: [facility];  Service: Endoscopy;  Laterality: N/A;  . EUS N/A 06/07/2017   Procedure: UPPER ENDOSCOPIC ULTRASOUND (EUS) RADIAL;  Surgeon: [name], MD;  Location: [facility];  Service: Endoscopy;  Laterality: N/A;  . FEMORAL-FEMORAL BYPASS GRAFT Bilateral 05/08/2017   Procedure: LEFT TO RIGHT BYPASS GRAFT FEMORAL-FEMORAL ARTERY;  Surgeon: Conrad Riverside, MD;  Location: [facility];  Service: Vascular;  Laterality: Bilateral;  . IR FLUORO GUIDE PORT INSERTION RIGHT  07/05/2017  . IR US GUIDE VASC ACCESS RIGHT  07/05/2017  . LAPAROSCOPIC INSERTION GASTROSTOMY TUBE N/A 11/27/2017   Procedure: JEJUNOSTOMY TUBE PLACEMENT;  Surgeon: [name], MD;  Location: [facility];  Service: General;  Laterality: N/A;  . LAPAROSCOPIC PARTIAL GASTRECTOMY N/A 11/27/2017   Procedure: PARTIAL GASTRECTOMY;  Surgeon: [name], MD;  Location: [facility];  Service: General;  Laterality: N/A;  . LAPAROSCOPY N/A 11/27/2017   Procedure: LAPAROSCOPY DIAGNOSTIC;  Surgeon: [name], MD;  Location: [facility];  Service: General;  Laterality: N/A;  . NM MYOVIEW LTD  06/2017   LOW RISK. Small basal inferoseptal defect thought to be related to artifact (although cannot exclude prior infarct). Normal wall motion i in this area would suggest artifact not infarct. Otherwise no ischemia or infarction.  Marland Kitchen PERIPHERAL VASCULAR INTERVENTION Left 05/03/2017   Procedure: PERIPHERAL VASCULAR INTERVENTION;  Surgeon: Conrad Oblong, MD;  Location: [facility];  Service: Cardiovascular;  Laterality: Left;  common iliac  . POLYPECTOMY  11/28/2018   Procedure: POLYPECTOMY;  Surgeon: [name], MD;   Location: [facility];  Service: Endoscopy;;  . TRANSTHORACIC ECHOCARDIOGRAM  06/26/2017   Normal LV size and function. EF 60-65%. No wall motion abnormalities. GR 1 DD. Mild LA dilation    Family History  Family history unknown: Yes    Social History   Socioeconomic History  . Marital status: Married    Spouse name: Not on file  . Number of children: 4  . Years of education: Not on file  . Highest education level: Not on file  Occupational History  . Not on file  Tobacco Use  . Smoking status: Current Every Day Smoker    Packs/day: 1.50    Years: 50.00    Pack years: 75.00    Types: Cigarettes    Last attempt to quit: 11/2017    Years since quitting: 2.1  . Smokeless tobacco: Never Used  . Tobacco comment: 8 cigs per day 08/05/19  Vaping Use  . Vaping Use: Never used  Substance and Sexual Activity  . Alcohol use: No    Alcohol/week: 0.0 standard drinks  . Drug use: No  . Sexual activity: Yes  Other Topics Concern  . Not on file  Social History Narrative  . Not on file   Social Determinants of Health   Financial Resource Strain:   . Difficulty of Paying Living Expenses:   Food Insecurity:   . Worried About Charity fundraiser in the Last Year:   .  Ran Out of Food in the Last Year:   Transportation Needs:   . Film/video editor (Medical):   Marland Kitchen Lack of Transportation (Non-Medical):   Physical Activity:   . Days of Exercise per Week:   . Minutes of Exercise per Session:   Stress:   . Feeling of Stress :   Social Connections:   . Frequency of Communication with Friends and Family:   . Frequency of Social Gatherings with Friends and Family:   . Attends Religious Services:   . Active Member of Clubs or Organizations:   . Attends Archivist Meetings:   Marland Kitchen Marital Status:   Intimate Partner Violence:   . Fear of Current or Ex-Partner:   . Emotionally Abused:   Marland Kitchen Physically Abused:   . Sexually Abused:     Review of Systems  Constitutional:  Positive for appetite change. Negative for fatigue.  HENT: Negative.   Eyes: Negative.   Respiratory: Positive for shortness of breath. Negative for cough, choking and chest tightness.   Cardiovascular: Negative.   Gastrointestinal: Negative.   Endocrine: Negative.   Psychiatric/Behavioral:       Depression    Vitals:   12/18/19 1542  BP: 114/76  Pulse: 78  Temp: (!) 97.3 F (36.3 C)  SpO2: 91%     Physical Exam Constitutional:      Comments: Underweight  HENT:     Head: Normocephalic and atraumatic.  Eyes:     General:        Right eye: No discharge.        Left eye: No discharge.     Conjunctiva/sclera: Conjunctivae normal.     Pupils: Pupils are equal, round, and reactive to light.  Neck:     Thyroid: No thyromegaly.     Trachea: No tracheal deviation.  Cardiovascular:     Rate and Rhythm: Normal rate and regular rhythm.  Pulmonary:     Effort: Pulmonary effort is normal. No respiratory distress.     Breath sounds: Normal breath sounds. No wheezing or rales.  Musculoskeletal:     Cervical back: Normal range of motion and neck supple.    Data Reviewed:  Hospital records reviewed Recent labs reviewed   Assessment:  .  Advanced COPD -Continues to smoke actively -Not very compliant with inhalers -Has been using Breztri  Shingles -Still has pain and discomfort around the site -We will increase Elavil from 10 mg to 25   .  Active smoker -Continues to smoke actively although says he is cutting down  .  History of gastric cancer -Continues to follow-up with oncology on a regular basis  .  Referral arterial disease .  Depression .  Anorexia   Plan/Recommendations: .  Continue current medications-Breztri .  Continue bronchodilator treatment .  Percocet every 6 for pain and discomfort .  25 mg of Elavil for neuralgia .  Continue oxygen supplementation  I will see him back in the office in about 2 to 3 months  Sherrilyn Rist MD [facility] 12/18/2019, 3:45 PM  CC: No ref. provider found

## 2019-12-18 NOTE — Addendum Note (Signed)
Addended by: Satira Sark D on: 12/18/2019 04:32 PM   Modules accepted: Orders

## 2019-12-23 ENCOUNTER — Telehealth: Payer: Self-pay

## 2019-12-23 NOTE — Telephone Encounter (Signed)
Mr Coddington's daughter, [name] left vm stating her father had left sided rib pain.  She states he has had shingles recently but she is concerned this is not shingle pain.  She is requesting an ov with Dr. Burr Medico.  I returned her call and left a vm.

## 2020-01-02 ENCOUNTER — Telehealth: Payer: Self-pay | Admitting: *Deleted

## 2020-01-02 ENCOUNTER — Telehealth: Payer: Self-pay | Admitting: Hematology

## 2020-01-02 NOTE — Telephone Encounter (Signed)
VM received from patient's daughter with concerns of generalized pain.  Requested to be seen ASAP.  Scheduling message sent for Monday 01/05/2020 at 1000 as patient is unable to come in today.  RN suggested patient visit ER if pain increases or patient experiences difficulty breathing or chest pain.  Daughter verbalizes understanding.

## 2020-01-02 NOTE — Telephone Encounter (Signed)
Spoke with pt's daughter and request an appt because pt is experiencing pain. Transferred phone call to symptom management to work pt in.

## 2020-01-05 ENCOUNTER — Inpatient Hospital Stay (HOSPITAL_BASED_OUTPATIENT_CLINIC_OR_DEPARTMENT_OTHER): Payer: Medicare Other | Admitting: Medical

## 2020-01-05 ENCOUNTER — Other Ambulatory Visit: Payer: Self-pay

## 2020-01-05 VITALS — BP 97/67 | HR 93 | Temp 98.8°F | Resp 18 | Ht 65.0 in | Wt 88.3 lb

## 2020-01-05 DIAGNOSIS — Z95828 Presence of other vascular implants and grafts: Secondary | ICD-10-CM

## 2020-01-05 DIAGNOSIS — C169 Malignant neoplasm of stomach, unspecified: Secondary | ICD-10-CM | POA: Diagnosis not present

## 2020-01-05 DIAGNOSIS — B0229 Other postherpetic nervous system involvement: Secondary | ICD-10-CM

## 2020-01-05 DIAGNOSIS — Z452 Encounter for adjustment and management of vascular access device: Secondary | ICD-10-CM | POA: Diagnosis not present

## 2020-01-05 MED ORDER — HEPARIN SOD (PORK) LOCK FLUSH 100 UNIT/ML IV SOLN
500.0000 [IU] | Freq: Once | INTRAVENOUS | Status: AC
  Administered 2020-01-05: 500 [IU]
  Filled 2020-01-05: qty 5

## 2020-01-05 MED ORDER — SODIUM CHLORIDE 0.9% FLUSH
10.0000 mL | Freq: Once | INTRAVENOUS | Status: AC
  Administered 2020-01-05: 10 mL
  Filled 2020-01-05: qty 10

## 2020-01-05 MED ORDER — GABAPENTIN 300 MG/6ML PO SOLN: 300.0000 mg | Freq: Two times a day (BID) | ORAL | 2 refills | Status: DC

## 2020-01-05 MED ORDER — TRAMADOL HCL 5 MG/ML PO SOLN: 10.0000 mL | Freq: Two times a day (BID) | ORAL | 0 refills | Status: DC

## 2020-01-05 NOTE — Patient Instructions (Signed)
Shingles  Shingles is an infection. It gives you a painful skin rash and blisters that have fluid in them. Shingles is caused by the same germ (virus) that causes chickenpox. Shingles only happens in people who:  Have had chickenpox.  Have been given a shot of medicine (vaccine) to protect against chickenpox. Shingles is rare in this group. The first symptoms of shingles may be itching, tingling, or pain in an area on your skin. A rash will show on your skin a few days or weeks later. The rash is likely to be on one side of your body. The rash usually has a shape like a belt or a band. Over time, the rash turns into fluid-filled blisters. The blisters will break open, change into scabs, and dry up. Medicines may:  Help with pain and itching.  Help you get better sooner.  Help to prevent long-term problems. Follow these instructions at home: Medicines  Take over-the-counter and prescription medicines only as told by your doctor.  Put on an anti-itch cream or numbing cream where you have a rash, blisters, or scabs. Do this as told by your doctor. Helping with itching and discomfort   Put cold, wet cloths (cold compresses) on the area of the rash or blisters as told by your doctor.  Cool baths can help you feel better. Try adding baking soda or dry oatmeal to the water to lessen itching. Do not bathe in hot water. Blister and rash care  Keep your rash covered with a loose bandage (dressing).  Wear loose clothing that does not rub on your rash.  Keep your rash and blisters clean. To do this, wash the area with mild soap and cool water as told by your doctor.  Check your rash every day for signs of infection. Check for: ? More redness, swelling, or pain. ? Fluid or blood. ? Warmth. ? Pus or a bad smell.  Do not scratch your rash. Do not pick at your blisters. To help you to not scratch: ? Keep your fingernails clean and cut short. ? Wear gloves or mittens when you sleep, if  scratching is a problem. General instructions  Rest as told by your doctor.  Keep all follow-up visits as told by your doctor. This is important.  Wash your hands often with soap and water. If soap and water are not available, use hand sanitizer. Doing this lowers your chance of getting a skin infection caused by germs (bacteria).  Your infection can cause chickenpox in people who have never had chickenpox or never got a shot of chickenpox vaccine. If you have blisters that did not change into scabs yet, try not to touch other people or be around other people, especially: ? Babies. ? Pregnant women. ? Children who have areas of red, itchy, or rough skin (eczema). ? Very old people who have transplants. ? People who have a long-term (chronic) sickness, like cancer or AIDS. Contact a doctor if:  Your pain does not get better with medicine.  Your pain does not get better after the rash heals.  You have any signs of infection in the rash area. These signs include: ? More redness, swelling, or pain around the rash. ? Fluid or blood coming from the rash. ? The rash area feeling warm to the touch. ? Pus or a bad smell coming from the rash. Get help right away if:  The rash is on your face or nose.  You have pain in your face or pain by   your eye.  You lose feeling on one side of your face.  You have trouble seeing.  You have ear pain, or you have ringing in your ear.  You have a loss of taste.  Your condition gets worse. Summary  Shingles gives you a painful skin rash and blisters that have fluid in them.  Shingles is an infection. It is caused by the same germ (virus) that causes chickenpox.  Keep your rash covered with a loose bandage (dressing). Wear loose clothing that does not rub on your rash.  If you have blisters that did not change into scabs yet, try not to touch other people or be around people. This information is not intended to replace advice given to you by  your health care provider. Make sure you discuss any questions you have with your health care provider. Document Revised: 10/18/2018 Document Reviewed: 02/28/2017 Elsevier Patient Education  2020 [employer]  

## 2020-01-08 NOTE — Progress Notes (Signed)
[facility] Progress Note   Bobby Caldwell [id number] 12-04-1955 64 y.o.  Bobby Caldwell is managed by Dr. [name]  Actively treated with chemotherapy/immunotherapy/hormonal therapy: no  Next scheduled appointment with provider: 03/12/2020  Assessment: Plan:    Port-A-Cath in place - Plan: heparin lock flush 100 unit/mL, sodium chloride flush (NS) 0.9 % injection 10 mL  Gastric adenocarcinoma ([place]) - Plan: heparin lock flush 100 unit/mL, sodium chloride flush (NS) 0.9 % injection 10 mL  Postherpetic neuralgia - Plan: gabapentin (NEURONTIN) 300 MG/6ML solution, traMADol HCl 5 MG/ML SOLN  Gastric adenocarcinoma: The patient continues to be followed conservatively by Dr. [name].  He is scheduled to be seen in follow-up on 03/12/2020.  Postherpetic neuralgia: The patient is family were reassured that his left upper quadrant pain was not associated with his history of a gastric adenocarcinoma given that it is occurring in an area where he has had shingles recently.  He was given a prescription for tramadol and gabapentin.   Please See After Visit Summary for patient specific instructions.  Future Appointments  Date Time Provider [place]  03/12/2020  9:15 AM [facility] None  03/12/2020  9:30 AM [facility] None  03/12/2020 10:00 AM [name], [facility] None    No orders of the defined types were placed in this encounter.      Subjective:   Patient ID:  Bobby Caldwell is a 64 y.o. (DOB 08-03-1955) male.  Chief Complaint:  Chief Complaint  Patient presents with  . Generalized Body Aches    HPI Bobby Caldwell  is a 64 y.o. male with a diagnosis of gastric adenocarcinoma.  He is followed conservatively by Dr. [name] and is on no current treatment.  He has recently had shingles in his left upper quadrant and presents to the clinic today with left upper quadrant pain with radiation across his superior abdomen.  He has been  eating little according to his daughter who interprets for him today.  He continues on supplemental oxygen via nasal cannula for his COPD.  Despite this he continues to smoke at least 10 cigarettes each day.  He is not interested in quitting.  Medications: I have reviewed the patient's current medications.  Allergies: No Known Allergies  Past Medical History:  Diagnosis Date  . COPD (chronic obstructive pulmonary disease) ([place])   . Dyspnea   . PAD (peripheral artery disease) ([place])   . Stomach cancer ([place])   . Tobacco abuse   . Weight loss     Past Surgical History:  Procedure Laterality Date  . ABDOMINAL AORTOGRAM W/LOWER EXTREMITY N/A 05/03/2017   Procedure: ABDOMINAL AORTOGRAM W/LOWER EXTREMITY;  Surgeon: Conrad Elkins, MD;  Location: [place] [facility];  Service: Cardiovascular;  Laterality: N/A;  . BIOPSY  08/22/2018   Procedure: BIOPSY;  Surgeon: [name], MD;  [facility];  Service: Endoscopy;;  . BIOPSY  07/31/2019   Procedure: BIOPSY;  Surgeon: [name], MD;  [facility];  Service: Endoscopy;;  . ESOPHAGOGASTRODUODENOSCOPY     05/24/17  . ESOPHAGOGASTRODUODENOSCOPY (EGD) WITH PROPOFOL N/A 05/24/2017   Procedure: ESOPHAGOGASTRODUODENOSCOPY (EGD) WITH PROPOFOL;  Surgeon: [name], MD;  [facility];  Service: Endoscopy;  Laterality: N/A;  . ESOPHAGOGASTRODUODENOSCOPY (EGD) WITH PROPOFOL N/A 08/22/2018   Procedure: ESOPHAGOGASTRODUODENOSCOPY (EGD) WITH PROPOFOL;  Surgeon: [name], MD;  [facility];  Service: Endoscopy;  Laterality: N/A;  . ESOPHAGOGASTRODUODENOSCOPY (EGD) WITH PROPOFOL N/A 11/28/2018  Procedure: ESOPHAGOGASTRODUODENOSCOPY (EGD) WITH PROPOFOL;  Surgeon: [name], MD;  [facility];  Service: Endoscopy;  Laterality: N/A;  . ESOPHAGOGASTRODUODENOSCOPY (EGD) WITH PROPOFOL N/A 07/31/2019   Procedure: ESOPHAGOGASTRODUODENOSCOPY (EGD) WITH PROPOFOL;  Surgeon: [name], MD;   [facility];  Service: Endoscopy;  Laterality: N/A;  . EUS N/A 06/07/2017   Procedure: UPPER ENDOSCOPIC ULTRASOUND (EUS) RADIAL;  Surgeon: [name], MD;  [facility];  Service: Endoscopy;  Laterality: N/A;  . FEMORAL-FEMORAL BYPASS GRAFT Bilateral 05/08/2017   Procedure: LEFT TO RIGHT BYPASS GRAFT FEMORAL-FEMORAL ARTERY;  Surgeon: Conrad Easton, MD;  Location: [place];  Service: Vascular;  Laterality: Bilateral;  . IR FLUORO GUIDE PORT INSERTION RIGHT  07/05/2017  . IR US GUIDE VASC ACCESS RIGHT  07/05/2017  . LAPAROSCOPIC INSERTION GASTROSTOMY TUBE N/A 11/27/2017   Procedure: JEJUNOSTOMY TUBE PLACEMENT;  Surgeon: [name], MD;  Location: [place];  Service: General;  Laterality: N/A;  . LAPAROSCOPIC PARTIAL GASTRECTOMY N/A 11/27/2017   Procedure: PARTIAL GASTRECTOMY;  Surgeon: [name], MD;  Location: [place];  Service: General;  Laterality: N/A;  . LAPAROSCOPY N/A 11/27/2017   Procedure: LAPAROSCOPY DIAGNOSTIC;  Surgeon: [name], MD;  Location: [place];  Service: General;  Laterality: N/A;  . NM MYOVIEW LTD  06/2017   LOW RISK. Small basal inferoseptal defect thought to be related to artifact (although cannot exclude prior infarct). Normal wall motion i in this area would suggest artifact not infarct. Otherwise no ischemia or infarction.  Marland Kitchen PERIPHERAL VASCULAR INTERVENTION Left 05/03/2017   Procedure: PERIPHERAL VASCULAR INTERVENTION;  Surgeon: Conrad Merrill, MD;  Location: [facility];  Service: Cardiovascular;  Laterality: Left;  common iliac  . POLYPECTOMY  11/28/2018   Procedure: POLYPECTOMY;  Surgeon: [name], MD;  [facility];  Service: Endoscopy;;  . TRANSTHORACIC ECHOCARDIOGRAM  06/26/2017   Normal LV size and function. EF 60-65%. No wall motion abnormalities. GR 1 DD. Mild LA dilation    Family History  Family history unknown: Yes    Social History   Socioeconomic History  . Marital status: Married    Spouse name:  Not on file  . Number of children: 4  . Years of education: Not on file  . Highest education level: Not on file  Occupational History  . Not on file  Tobacco Use  . Smoking status: Current Every Day Smoker    Packs/day: 1.50    Years: 50.00    Pack years: 75.00    Types: Cigarettes    Last attempt to quit: 11/2017    Years since quitting: 2.1  . Smokeless tobacco: Never Used  . Tobacco comment: 8 cigs per day 08/05/19  Vaping Use  . Vaping Use: Never used  Substance and Sexual Activity  . Alcohol use: No    Alcohol/week: 0.0 standard drinks  . Drug use: No  . Sexual activity: Yes  Other Topics Concern  . Not on file  Social History Narrative  . Not on file   Social Determinants of Health   Financial Resource Strain:   . Difficulty of Paying Living Expenses:   Food Insecurity:   . Worried About Charity fundraiser in the Last Year:   . Arboriculturist in the Last Year:   Transportation Needs:   . Film/video editor (Medical):   Marland Kitchen Lack of Transportation (Non-Medical):   Physical Activity:   . Days of Exercise per Week:   . Minutes  of Exercise per Session:   Stress:   . Feeling of Stress :   Social Connections:   . Frequency of Communication with Friends and Family:   . Frequency of Social Gatherings with Friends and Family:   . Attends Religious Services:   . Active Member of Clubs or Organizations:   . Attends Archivist Meetings:   Marland Kitchen Marital Status:   Intimate Partner Violence:   . Fear of Current or Ex-Partner:   . Emotionally Abused:   Marland Kitchen Physically Abused:   . Sexually Abused:     Past Medical History, Surgical history, Social history, and Family history were reviewed and updated as appropriate.   Please see review of systems for further details on the patient's review from today.   Review of Systems:  Review of Systems  Constitutional: Positive for appetite change. Negative for activity change, chills, diaphoresis and fever.  HENT:  Negative for trouble swallowing.   Respiratory: Positive for shortness of breath. Negative for cough and chest tightness.   Cardiovascular: Negative for chest pain, palpitations and leg swelling.  Gastrointestinal: Positive for abdominal pain. Negative for abdominal distention, constipation, diarrhea, nausea and vomiting.    Objective:   Physical Exam:  BP 97/67 (BP Location: Right Arm, Patient Position: Sitting)   Pulse 93   Temp 98.8 F (37.1 C) (Oral)   Resp 18   Ht 5\' 5"  (1.651 m)   Wt 88 lb 4.8 oz (40.1 kg)   SpO2 93%   BMI 14.69 kg/m  ECOG: 1  Physical Exam Constitutional:      General: He is not in acute distress.    Appearance: He is not diaphoretic.     Comments: The patient is an adult chronically ill-appearing male who appears to be in no acute distress.  He is receiving oxygen via nasal cannula.  He presents to the clinic today with his wife and daughter with his daughter interpreting for him.  HENT:     Head: Normocephalic and atraumatic.  Cardiovascular:     Rate and Rhythm: Normal rate and regular rhythm.     Heart sounds: Normal heart sounds. No murmur heard.  No friction rub. No gallop.   Pulmonary:     Effort: Pulmonary effort is normal. No respiratory distress.     Breath sounds: No stridor. Examination of the right-upper field reveals decreased breath sounds. Examination of the left-upper field reveals decreased breath sounds. Examination of the right-middle field reveals decreased breath sounds. Examination of the left-middle field reveals decreased breath sounds. Examination of the right-lower field reveals decreased breath sounds. Examination of the left-lower field reveals decreased breath sounds. Decreased breath sounds present. No wheezing or rales.  Musculoskeletal:        General: No deformity.  Skin:    General: Skin is warm and dry.     Findings: Erythema and rash present.       Neurological:     Mental Status: He is alert.     Coordination:  Coordination normal.     Gait: Gait abnormal (The patient is ambulating with the use of a wheelchair.).  Psychiatric:        Mood and Affect: Mood normal.        Behavior: Behavior normal.        Thought Content: Thought content normal.        Judgment: Judgment normal.     Lab Review:     Component Value Date/Time   NA 134 (L) 11/18/2019 2119  NA 139 07/11/2017 0848   K 3.9 11/18/2019 0856   K 3.5 07/11/2017 0848   CL 93 (L) 11/18/2019 0856   CO2 33 (H) 11/18/2019 0856   CO2 32 (H) 07/11/2017 0848   GLUCOSE 83 11/18/2019 0856   GLUCOSE 109 07/11/2017 0848   BUN 5 (L) 11/18/2019 0856   BUN 9.8 07/11/2017 0848   CREATININE 0.62 11/18/2019 0856   CREATININE 0.60 (L) 08/01/2019 0954   CREATININE 0.7 07/11/2017 0848   CALCIUM 8.4 (L) 11/18/2019 0856   CALCIUM 9.0 07/11/2017 0848   PROT 6.4 (L) 10/12/2019 1007   PROT 7.1 07/11/2017 0848   ALBUMIN 2.7 (L) 10/15/2019 0254   ALBUMIN 3.6 07/11/2017 0848   AST 21 10/12/2019 1007   AST 11 (L) 08/01/2019 0954   AST 15 07/11/2017 0848   ALT 18 10/12/2019 1007   ALT 11 08/01/2019 0954   ALT 17 07/11/2017 0848   ALKPHOS 82 10/12/2019 1007   ALKPHOS 116 07/11/2017 0848   BILITOT 0.8 10/12/2019 1007   BILITOT 0.4 08/01/2019 0954   BILITOT 0.54 07/11/2017 0848   GFRNONAA >60 11/18/2019 0856   GFRNONAA >60 08/01/2019 0954   GFRAA >60 11/18/2019 0856   GFRAA >60 08/01/2019 0954       Component Value Date/Time   WBC 7.0 11/18/2019 0856   RBC 5.10 11/18/2019 0856   HGB 13.7 11/18/2019 0856   HGB 12.9 (L) 08/01/2019 0954   HGB 15.0 07/11/2017 0848   HCT 44.1 11/18/2019 0856   HCT 43.7 07/11/2017 0848   PLT 252 11/18/2019 0856   PLT 203 08/01/2019 0954   PLT 165 07/11/2017 0848   MCV 86.5 11/18/2019 0856   MCV 86.7 07/11/2017 0848   MCH 26.9 11/18/2019 0856   MCHC 31.1 11/18/2019 0856   RDW 16.2 (H) 11/18/2019 0856   RDW 12.8 07/11/2017 0848   LYMPHSABS 1.2 10/12/2019 1007   LYMPHSABS 1.4 07/11/2017 0848   MONOABS 0.6  10/12/2019 1007   MONOABS 0.6 07/11/2017 0848   EOSABS 0.0 10/12/2019 1007   EOSABS 0.2 07/11/2017 0848   BASOSABS 0.1 10/12/2019 1007   BASOSABS 0.1 07/11/2017 0848   -------------------------------  Imaging from last 24 hours (if applicable):  Radiology interpretation: No results found.

## 2020-01-14 ENCOUNTER — Telehealth: Payer: Self-pay

## 2020-01-14 NOTE — Telephone Encounter (Signed)
Mr Westmoreland's daughter left vm wanting to know if you are okay with him traveling to [place] this summer?   Let me know and I will call her.

## 2020-01-15 NOTE — Telephone Encounter (Signed)
I returned Bobby Caldwell's call.  Per Dr. Burr Medico [name] can travel to [place] if he is feeling strong enough.  She verbalized understanding.

## 2020-02-12 ENCOUNTER — Telehealth: Payer: Self-pay

## 2020-02-12 NOTE — Telephone Encounter (Signed)
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 Left message on machine to call back

## 2020-02-12 NOTE — Telephone Encounter (Signed)
Error reason for EGD is gastric cancer

## 2020-02-13 NOTE — Telephone Encounter (Signed)
Left message on machine to call back  

## 2020-02-16 NOTE — Telephone Encounter (Signed)
Unable to reach pt by phone will mail letter  

## 2020-03-10 ENCOUNTER — Telehealth: Payer: Self-pay | Admitting: Pulmonary Disease

## 2020-03-10 NOTE — Telephone Encounter (Signed)
Called and spoke with Bobby Caldwell, she is requesting an order for a nebulizer machine to be sent to the [name] pharmacy for the patient.  He is currently in [name] and his nebulizer machine broke.  He is currently in the hospital with a COPD exacerbation.  Advised I would send this information to his physician and would let them know the decision about the nebulizer.  The address for the hospital pharmacy is [name].  Dr. Ander Slade, please advise.  Thank you.

## 2020-03-11 ENCOUNTER — Telehealth: Payer: Self-pay

## 2020-03-11 ENCOUNTER — Telehealth: Payer: Self-pay | Admitting: Hematology

## 2020-03-11 NOTE — Telephone Encounter (Signed)
[name], Mr Yasin's daughter called stating he is in [place] in the ED his nebulizer broke.  She is wonder if we ordered it in the past.  I gave her the number for Dr. Judson Roch office.  She also cancelled his appt for this Friday.  I sent a scheduling message for those to be reschedule.  I also asked schedulers to call matijame with the appt date and time.

## 2020-03-11 NOTE — Telephone Encounter (Signed)
Scheduled appt per 9/2 sch msg - pt daughter is aware of appt .

## 2020-03-12 ENCOUNTER — Other Ambulatory Visit: Payer: Medicaid Other

## 2020-03-12 ENCOUNTER — Ambulatory Visit: Payer: Medicaid Other | Admitting: Hematology

## 2020-03-12 NOTE — Telephone Encounter (Signed)
We can send in a prescription for nebulizer  I think we will have to have somebody in the office sign an actual form if the patient is [place] of state  Not completely sure how this works

## 2020-03-12 NOTE — Telephone Encounter (Signed)
Called the hospital to see why/how we would need to order a nebulizer to the hospital if he is currently being treated.  Spoke to an attending doctor who advised that patient has already been discharged and they wrote him a nebulizer rx upon discharge.  Nothing further needed at this time- will close encounter.

## 2020-03-22 NOTE — Progress Notes (Signed)
[place]   Telephone:([phone]) [phone] [phone]) [phone]   Clinic Follow up Note   Patient Care Team: [name], MD as PCP - General (Hematology) [name], MD as Consulting Physician (Pulmonary Disease) [name], MD as Attending Physician (Gastroenterology) [name], MD as Consulting Physician (General Surgery) Laurin Coder, MD as Consulting Physician (Pulmonary Disease)  Date of Service:  03/23/2020  CHIEF COMPLAINT: F/u on gastric adenocarcinoma  SUMMARY OF ONCOLOGIC HISTORY: Oncology History Overview Note  Cancer Staging Gastric cancer [facility]) Staging form: Stomach, AJCC 8th Edition - Clinical stage from 05/24/2017: Stage I (cT2, cN0, cM0) - Signed by [name], MD on 06/19/2017 - Pathologic stage from 11/27/2017: Stage I (ypT1b, pN0, cM0) - Signed by [name], MD on 04/04/2018     Gastric adenocarcinoma ([place])  05/24/2017 Procedure   EGD by Dr. [name] 05/24/17 IMPRESSION Ulcerated mass like region in the distal stomach (4cm across), extensively biopsied. This appears neoplastic. - Abnormal mucosa throughout stomach otherwise (mild-moderate gastritis), also extensively biopsied.   05/24/2017 Initial Biopsy   Diagnosis 05/24/17  1. Stomach, biopsy, mass distal - ADENOCARCINOMA, SEE COMMENT. - CHRONIC ACTIVE GASTRITIS WITH INTESTINAL METAPLASIA. 2. 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 - NO DYSPLASIA OR MALIGNANCY   06/04/2017 Initial Diagnosis   Gastric cancer ([place])   06/07/2017 Procedure   EUS by Dr. [name] on 06/07/17  IMPRESSION:  5cm across, partially circumferential uT2N0 gastric adenocarcinoma laying in the distal stomach 3-4cm proximal to the pylorus.   06/22/2017 Imaging   CT CAP W Contrast 06/22/17  IMPRESSION: 1. The gastric mass is somewhat inconspicuous on CT, but a potential 1.2 cm thick mucosal masses seen along the posterior  gastric border on image 83/2. There several small adjacent right gastric lymph nodes measuring up to 0.7 cm in short axis, but no overtly pathologic adenopathy and no findings of hepatic or peritoneal metastatic disease at this time. 2. Calcified granulomas in both lungs along with several noncalcified nodules stable from August 2016 and likely benign given this long-term stability. 3. Other imaging findings of potential clinical significance: Aortic Atherosclerosis (ICD10-I70.0) and Emphysema (ICD10-J43.9). Airway thickening is present, suggesting bronchitis or reactive airways disease. 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 Disc bulges at L3-4 and L4-5.   07/05/2017 Surgery   PAC placement    07/07/2017 - 09/06/2017 Chemotherapy   Neoadjuvant FOLFOX with with leucovorin every 2 weeks. Plan for 6 cycles but stopped after 4 cycles due to poor tolerance     09/10/2017 Imaging    CT AP W Contrast 09/10/17 IMPRESSION: 1. No acute findings identified within the abdomen or pelvis. 2. Interval development of multiple bilateral pulmonary densities within both lower lobes. These are indeterminate, likely post infectious or inflammatory in etiology. Atypical infection not excluded. Metastatic disease is considered less favored but not entirely excluded and follow-up imaging to ensure resolution is advised. 3. No evidence to suggest recurrent tumor or metastatic disease within the abdomen or pelvis. 4.  Aortic Atherosclerosis (ICD10-I70.0).   11/27/2017 Surgery    Surgery He had partial gastrectomy with Dr. [name] on 11/27/2017.   11/27/2017 Pathology Results   11/27/2017 Surgical Pathology Diagnosis 1. Stomach, resection for tumor, Distal w/ Omentum - ADENOCARCINOMA, MODERATELY DIFFERENTIATED, 3.8 CM - LYMPHOVASCULAR SPACE AND PERINEURAL INVASION - NO CARCINOMA IDENTIFIED IN FOUR LYMPH NODES (0/4) - SEE  ONCOLOGY TABLE AND COMMENT BELOW 2.  Lymph node, biopsy, Portal - NO CARCINOMA IDENTIFIED IN ONE LYMPH NODE (0/1) - SEE COMMENT 3. Lymph node, biopsy, Left Gastric - NO CARCINOMA IDENTIFIED IN TWO LYMPH NODES (0/2) - SEE COMMENT 4. Omentum, resection for tumor - BENIGN FIBROADIPOSE TISSUE - NO CARCINOMA IDENTIFIED    11/27/2017 Cancer Staging   Staging form: Stomach, AJCC 8th Edition - Pathologic stage from 11/27/2017: Stage I (ypT1b, pN0, cM0) - Signed by [name], MD on 04/04/2018   08/14/2018 Imaging   CT CAP 08/14/18 IMPRESSION:  1. No findings of active malignancy. 2. Generally stable pulmonary nodules are identified in the lungs. The only new nodule seen today is a 3 mm right apical nodule on image 32/4 which may merit surveillance. Many of the basilar nodules shown on the prior CT abdomen from 09/10/2017 have resolved. 3. Other imaging findings of potential clinical significance: Aortic Atherosclerosis (ICD10-I70.0). Emphysema (ICD10-J43.9). Lumbar degenerative disc disease.   08/22/2018 Procedure   Upper endoscopy 08/22/18 by Dr Verdie Drown The esophagus was slightly tortuous but there were no strictures/stenosis and the mucosa was normal throughout. The gastro-jejunostomy site was widely patent, Bilroth II anatomy. There was a 1.5-2cm region of the anastomosis that was discretely abnormal, soft but somewhat neoplastic appearing and nodular. I biopsied this region extensively. Diagnosis Stomach, biopsy, GJ anastomosis erythema - LOW GRADE GLANDULAR DYSPLASIA. - GOBLET CELL METAPLASIA. - SEE COMMENT.   11/28/2018 Procedure   Upper Endoscopy 11/28/18 by Dr Verdie Drown - Bilroth II anatomy. The previously noted adenomatous polyp was again located at the [place] anastomosis. It was soft, sessile, about 1.9cm across in greatest dimension. I used mixed cold/cautery snare resection to (apparently) completely remove the polyp. Diagnosis Stomach, polyp(s) - LOW GRADE  GLANDULAR DYSPLASIA. - GOBLET CELL METAPLASIA.   07/31/2019 Procedure   Upper Endoscopy by Dr. [name] 07/31/19  IMPRESSION - Typical Bilroth II anatomy. The GJ anastomosis was slightly inflammed, but not overtly neoplastic appearing. No clear recurrent or residual polyps. The anastomosis was sampled at the sight of most significant appearing inflammation (1-2cm section, see images). - Afferent and efferent limbs were intubated briefly and appeared normal. - Mild candida esophagitis, not sampled. Will start empiric diflucan course (100mg  daily for 10 days) - The examination was otherwise normal. FINAL MICROSCOPIC DIAGNOSIS:   A.  IRREGULAR MUCOSA AT GJ JUNCTION, BIOPSY:  -Anastomosis site with peptic change and low grade dysplasia.       CURRENT THERAPY:  Surveillance  INTERVAL HISTORY:  Kaius Daino is here for a follow up. He was last seen by me 7 months ago. He presents to the clinic with his wife.  He came in a wheelchair.  He went to [place] a few months ago and was there for 3 weeks.  On his way home, his nebulizer got broken, he became short of breath, and went to emergency room in [place].  He is oxygen dependent around-the-clock now, however he does not have a portable oxygen tank.  He has lost quite a bit of weight since last year, 14lbs in past 7 months.  He reports persistent upper abdominal in the past 3 to 4 months, sometimes quite severe.  He was seen by symptom management clinic PA [facility] on 01/05/2020, was prescribed with tramadol, but he ran out a while ago. He is quite fatigued, not eating much, also wakes up frequently at night.  Review of system otherwise negative.   MEDICAL HISTORY:  Past Medical History:  Diagnosis Date  . COPD (chronic obstructive  pulmonary disease) ([place])   . Dyspnea   . PAD (peripheral artery disease) ([place])   . Stomach cancer ([place])   . Tobacco abuse   . Weight loss     SURGICAL HISTORY: Past Surgical History:  Procedure Laterality Date  .  ABDOMINAL AORTOGRAM W/LOWER EXTREMITY N/A 05/03/2017   Procedure: ABDOMINAL AORTOGRAM W/LOWER EXTREMITY;  Surgeon: Conrad Laurel, MD;  Location: [facility];  Service: Cardiovascular;  Laterality: N/A;  . BIOPSY  08/22/2018   Procedure: BIOPSY;  Surgeon: [name], MD;  Location: [facility];  Service: Endoscopy;;  . BIOPSY  07/31/2019   Procedure: BIOPSY;  Surgeon: [name], MD;  Location: [facility];  Service: Endoscopy;;  . ESOPHAGOGASTRODUODENOSCOPY     05/24/17  . ESOPHAGOGASTRODUODENOSCOPY (EGD) WITH PROPOFOL N/A 05/24/2017   Procedure: ESOPHAGOGASTRODUODENOSCOPY (EGD) WITH PROPOFOL;  Surgeon: [name], MD;  [facility];  Service: Endoscopy;  Laterality: N/A;  . ESOPHAGOGASTRODUODENOSCOPY (EGD) WITH PROPOFOL N/A 08/22/2018   Procedure: ESOPHAGOGASTRODUODENOSCOPY (EGD) WITH PROPOFOL;  Surgeon: [name], MD;  [facility];  Service: Endoscopy;  Laterality: N/A;  . ESOPHAGOGASTRODUODENOSCOPY (EGD) WITH PROPOFOL N/A 11/28/2018   Procedure: ESOPHAGOGASTRODUODENOSCOPY (EGD) WITH PROPOFOL;  Surgeon: [name], MD;  [facility];  Service: Endoscopy;  Laterality: N/A;  . ESOPHAGOGASTRODUODENOSCOPY (EGD) WITH PROPOFOL N/A 07/31/2019   Procedure: ESOPHAGOGASTRODUODENOSCOPY (EGD) WITH PROPOFOL;  Surgeon: [name], MD;  [facility];  Service: Endoscopy;  Laterality: N/A;  . EUS N/A 06/07/2017   Procedure: UPPER ENDOSCOPIC ULTRASOUND (EUS) RADIAL;  Surgeon: [name], MD;  [facility];  Service: Endoscopy;  Laterality: N/A;  . FEMORAL-FEMORAL BYPASS GRAFT Bilateral 05/08/2017   Procedure: LEFT TO RIGHT BYPASS GRAFT FEMORAL-FEMORAL ARTERY;  Surgeon: Conrad Wauneta, MD;  Location: [facility];  Service: Vascular;  Laterality: Bilateral;  . IR FLUORO GUIDE PORT INSERTION RIGHT  07/05/2017  . IR US GUIDE VASC ACCESS RIGHT  07/05/2017  . LAPAROSCOPIC INSERTION GASTROSTOMY TUBE N/A 11/27/2017   Procedure:  JEJUNOSTOMY TUBE PLACEMENT;  Surgeon: [name], MD;  Location: [place] [facility];  Service: General;  Laterality: N/A;  . LAPAROSCOPIC PARTIAL GASTRECTOMY N/A 11/27/2017   Procedure: PARTIAL GASTRECTOMY;  Surgeon: [name], MD;  Location: [place];  Service: General;  Laterality: N/A;  . LAPAROSCOPY N/A 11/27/2017   Procedure: LAPAROSCOPY DIAGNOSTIC;  Surgeon: [name], MD;  Location: [facility];  Service: General;  Laterality: N/A;  . NM MYOVIEW LTD  06/2017   LOW RISK. Small basal inferoseptal defect thought to be related to artifact (although cannot exclude prior infarct). Normal wall motion i in this area would suggest artifact not infarct. Otherwise no ischemia or infarction.  Marland Kitchen PERIPHERAL VASCULAR INTERVENTION Left 05/03/2017   Procedure: PERIPHERAL VASCULAR INTERVENTION;  Surgeon: Conrad , MD;  Location: [facility];  Service: Cardiovascular;  Laterality: Left;  common iliac  . POLYPECTOMY  11/28/2018   Procedure: POLYPECTOMY;  Surgeon: [name], MD;  [facility];  Service: Endoscopy;;  . TRANSTHORACIC ECHOCARDIOGRAM  06/26/2017   Normal LV size and function. EF 60-65%. No wall motion abnormalities. GR 1 DD. Mild LA dilation    I have reviewed the social history and family history with the patient and they are unchanged from previous note.  ALLERGIES:  has No Known Allergies.  MEDICATIONS:  Current Outpatient Medications  Medication Sig Dispense Refill  . Budeson-Glycopyrrol-Formoterol (BREZTRI AEROSPHERE) 160-9-4.8 MCG/ACT AERO Inhale 2 puffs into the lungs 2 (two) times daily.  5.9 g 0  . budesonide-formoterol (SYMBICORT) 160-4.5 MCG/ACT inhaler Inhale 2 puffs into the lungs 2 (two) times daily. 6 g 5  . feeding supplement, ENSURE ENLIVE, (ENSURE ENLIVE) LIQD Take 237 mLs by mouth 2 (two) times daily between meals. 14222 mL 0  . gabapentin (NEURONTIN) 300 MG/6ML solution Take 6 mLs (300 mg total) by mouth 2 (two) times daily. 470 mL 2  . ipratropium-albuterol  (DUONEB) 0.5-2.5 (3) MG/3ML SOLN Inhale 3 mLs into the lungs 4 (four) times daily. Dx:J44.9 360 mL 5  . mirtazapine (REMERON) 7.5 MG tablet Take 1 tablet (7.5 mg total) by mouth at bedtime. 30 tablet 0  . Multiple Vitamin (MULTIVITAMIN WITH MINERALS) TABS tablet Take 1 tablet by mouth daily. 30 tablet 0  . ondansetron (ZOFRAN) 4 MG tablet Take 1 tablet (4 mg total) by mouth every 8 (eight) hours as needed for nausea or vomiting. 15 tablet 0  . oxyCODONE-acetaminophen (PERCOCET) 7.5-325 MG tablet Take 1 tablet by mouth every 6 (six) hours as needed for severe pain. 30 tablet 0  . OXYGEN Inhale 2 L into the lungs continuous.    . Tiotropium Bromide Monohydrate (SPIRIVA RESPIMAT) 2.5 MCG/ACT AERS Inhale 2 puffs into the lungs daily. 4 g 5  . traMADol (ULTRAM) 50 MG tablet Take 1 tablet (50 mg total) by mouth every 12 (twelve) hours as needed. 30 tablet 0   No current facility-administered medications for this visit.    PHYSICAL EXAMINATION: ECOG PERFORMANCE STATUS: 3 - Symptomatic, >50% confined to bed  Vitals:   03/23/20 1053  BP: 105/82  Pulse: 100  Resp: 18  Temp: 97.8 F (36.6 C)  SpO2: 94%   Filed Weights   03/23/20 1053  Weight: 87 lb 6.4 oz (39.6 kg)    GENERAL:alert, very thin, cachectic, on nasal cannula oxygen, in wheelchair SKIN: skin color, texture, turgor are normal, no rashes or significant lesions EYES: normal, Conjunctiva are pink and non-injected, sclera clear NECK: supple, thyroid normal size, non-tender, without nodularity LYMPH:  no palpable lymphadenopathy in the cervical, axillary  LUNGS: Decreased breath sounds on bilateral lungs, with occasional wheezing and rhonchi. HEART: regular rate & rhythm and no murmurs and no lower extremity edema ABDOMEN:abdomen soft, diffuse tenderness in the upper abdomen. Bowel sound normal. Musculoskeletal:no cyanosis of digits and no clubbing  NEURO: alert & oriented x 3 with fluent speech, no focal motor/sensory  deficits  LABORATORY DATA:  I have reviewed the data as listed CBC Latest Ref Rng & Units 03/23/2020 11/18/2019 10/15/2019  WBC 4.0 - 10.5 K/uL 7.8 7.0 7.4  Hemoglobin 13.0 - 17.0 g/dL 12.4(L) 13.7 12.3(L)  Hematocrit 39 - 52 % 37.9(L) 44.1 38.7(L)  Platelets 150 - 400 K/uL 207 252 235     CMP Latest Ref Rng & Units 03/23/2020 11/18/2019 10/15/2019  Glucose 70 - 99 mg/dL 80 83 144(H)  BUN 8 - 23 mg/dL 6(L) 5(L) 12  Creatinine 0.61 - 1.24 mg/dL 0.50(L) 0.62 0.44(L)  Sodium 135 - 145 mmol/L 139 134(L) 140  Potassium 3.5 - 5.1 mmol/L 3.6 3.9 4.3  Chloride 98 - 111 mmol/L 98 93(L) 94(L)  CO2 22 - 32 mmol/L 34(H) 33(H) 39(H)  Calcium 8.9 - 10.3 mg/dL 8.4(L) 8.4(L) 8.1(L)  Total Protein 6.5 - 8.1 g/dL 6.0(L) - -  Total Bilirubin 0.3 - 1.2 mg/dL 0.5 - -  Alkaline Phos 38 - 126 U/L 80 - -  AST 15 - 41 U/L 13(L) - -  ALT 0 - 44 U/L 11 - -  RADIOGRAPHIC STUDIES: I have personally reviewed the radiological images as listed and agreed with the findings in the report. No results found.   ASSESSMENT & PLAN:  Bobby Caldwell is a 64 y.o. male with    1. Gastric Adenocarcinoma, uT2N0M0, stage I, ypT1bN0 -Diagnosed in 05/2017. Treated withneoadjuvantchemo and surgery. Currently on surveillance. -His Upper Endoscopy with Dr [name] from 07/31/19 shows no evidence of recurrence, mild esophagitis, which was treated with fluconazole. -His last CT CAP form 08/18/19 showed no evidence of recurrence. -He has developed upper abdominal pain, lost more than 10 pounds, very fatigued, not doing well clinically.  I will repeat CT chest, abdomen pelvis in the next few weeks to ruled out cancer recurrence -Lab reviewed, CBC and CMP unremarkable -Follow-up after CT scan   2. COPD, Smoking Cessation -I previously advised him to quit smoking. -he will f/u pulmonologist Dr. Ander Slade tomorrow -he needs portable oxygen tank, my nurse will check with his oxygen supplier   3.Depression and or  anxiety -Currently on Ativan as needed. Continue and monitor.  4.Low appetite and weight loss -Repeat endoscopies in February a and May 2020 show benign polyp at [facility] anastamosis. -He used megace, mirtazapine and Wellbutrin. Heresponds better tomegace(he uses as needed), he is not on any of these now -Due to his significant weight loss, insomnia, I will restart mirtazapine first.  5. COVID19 (+)  -He tested positive for COVID19 on 06/23/19.  He was quite symptomatic, but did not require hospitalization.  -recovered now   Plan -I called in mirtazapine 7.5 mg hs for him. refilled tramadol and zofran  -CT chest, abdomen pelvis with contrast in the next 1 to 2 weeks -Follow-up after CT scan.   No problem-specific Assessment & Plan notes found for this encounter.   Orders Placed This Encounter  Procedures  . CT Abdomen Pelvis W Contrast    Standing Status:   Future    Standing Expiration Date:   03/23/2021    Order Specific Question:   If indicated for the ordered procedure, I authorize the administration of contrast media per Radiology protocol    Answer:   Yes    Order Specific Question:   Preferred imaging location?    Answer:   [facility]    Order Specific Question:   Release to patient    Answer:   Immediate    Order Specific Question:   Is Oral Contrast requested for this exam?    Answer:   Yes, Per Radiology protocol    Order Specific Question:   Radiology Contrast Protocol - do NOT remove file path    Answer:   [email or web address]  . CT Chest W Contrast    Standing Status:   Future    Standing Expiration Date:   03/23/2021    Order Specific Question:   If indicated for the ordered procedure, I authorize the administration of contrast media per Radiology protocol    Answer:   Yes    Order Specific Question:   Preferred imaging location?    Answer:   [facility]    Order Specific Question:   Radiology Contrast  Protocol - do NOT remove file path    Answer:   [email or web address]   All questions were answered. The patient knows to call the clinic with any problems, questions or concerns. No barriers to learning was detected. The total time spent in the appointment was 30 minutes.     [name], MD 03/23/2020   I, Joslyn Devon,  am acting as scribe for Fayette Gasner, MD.   I have reviewed the above documentation for accuracy and completeness, and I agree with the above.       

## 2020-03-23 ENCOUNTER — Inpatient Hospital Stay: Payer: Medicare Other

## 2020-03-23 ENCOUNTER — Other Ambulatory Visit: Payer: Self-pay

## 2020-03-23 ENCOUNTER — Inpatient Hospital Stay: Payer: Medicare Other | Attending: Hematology | Admitting: Hematology

## 2020-03-23 ENCOUNTER — Encounter: Payer: Self-pay | Admitting: Hematology

## 2020-03-23 VITALS — BP 105/82 | HR 100 | Temp 97.8°F | Resp 18 | Ht 65.0 in | Wt 87.4 lb

## 2020-03-23 DIAGNOSIS — C169 Malignant neoplasm of stomach, unspecified: Secondary | ICD-10-CM

## 2020-03-23 DIAGNOSIS — Z95828 Presence of other vascular implants and grafts: Secondary | ICD-10-CM

## 2020-03-23 DIAGNOSIS — R634 Abnormal weight loss: Secondary | ICD-10-CM | POA: Insufficient documentation

## 2020-03-23 DIAGNOSIS — Z79899 Other long term (current) drug therapy: Secondary | ICD-10-CM | POA: Insufficient documentation

## 2020-03-23 DIAGNOSIS — F17210 Nicotine dependence, cigarettes, uncomplicated: Secondary | ICD-10-CM | POA: Insufficient documentation

## 2020-03-23 DIAGNOSIS — Z85028 Personal history of other malignant neoplasm of stomach: Secondary | ICD-10-CM | POA: Diagnosis present

## 2020-03-23 DIAGNOSIS — I70213 Atherosclerosis of native arteries of extremities with intermittent claudication, bilateral legs: Secondary | ICD-10-CM | POA: Diagnosis not present

## 2020-03-23 DIAGNOSIS — J449 Chronic obstructive pulmonary disease, unspecified: Secondary | ICD-10-CM | POA: Insufficient documentation

## 2020-03-23 DIAGNOSIS — Z8616 Personal history of COVID-19: Secondary | ICD-10-CM | POA: Insufficient documentation

## 2020-03-23 DIAGNOSIS — F329 Major depressive disorder, single episode, unspecified: Secondary | ICD-10-CM | POA: Diagnosis not present

## 2020-03-23 DIAGNOSIS — C162 Malignant neoplasm of body of stomach: Secondary | ICD-10-CM

## 2020-03-23 DIAGNOSIS — R5383 Other fatigue: Secondary | ICD-10-CM | POA: Insufficient documentation

## 2020-03-23 LAB — CBC WITH DIFFERENTIAL/PLATELET
Abs Immature Granulocytes: 0.02 10*3/uL (ref 0.00–0.07)
Basophils Absolute: 0.1 10*3/uL (ref 0.0–0.1)
Basophils Relative: 1 %
Eosinophils Absolute: 0.4 10*3/uL (ref 0.0–0.5)
Eosinophils Relative: 5 %
HCT: 37.9 % — ABNORMAL LOW (ref 39.0–52.0)
Hemoglobin: 12.4 g/dL — ABNORMAL LOW (ref 13.0–17.0)
Immature Granulocytes: 0 %
Lymphocytes Relative: 15 %
Lymphs Abs: 1.2 10*3/uL (ref 0.7–4.0)
MCH: 29 pg (ref 26.0–34.0)
MCHC: 32.7 g/dL (ref 30.0–36.0)
MCV: 88.6 fL (ref 80.0–100.0)
Monocytes Absolute: 0.6 10*3/uL (ref 0.1–1.0)
Monocytes Relative: 7 %
Neutro Abs: 5.6 10*3/uL (ref 1.7–7.7)
Neutrophils Relative %: 72 %
Platelets: 207 10*3/uL (ref 150–400)
RBC: 4.28 MIL/uL (ref 4.22–5.81)
RDW: 14 % (ref 11.5–15.5)
WBC: 7.8 10*3/uL (ref 4.0–10.5)
nRBC: 0 % (ref 0.0–0.2)

## 2020-03-23 LAB — COMPREHENSIVE METABOLIC PANEL
ALT: 11 U/L (ref 0–44)
AST: 13 U/L — ABNORMAL LOW (ref 15–41)
Albumin: 3.2 g/dL — ABNORMAL LOW (ref 3.5–5.0)
Alkaline Phosphatase: 80 U/L (ref 38–126)
Anion gap: 7 (ref 5–15)
BUN: 6 mg/dL — ABNORMAL LOW (ref 8–23)
CO2: 34 mmol/L — ABNORMAL HIGH (ref 22–32)
Calcium: 8.4 mg/dL — ABNORMAL LOW (ref 8.9–10.3)
Chloride: 98 mmol/L (ref 98–111)
Creatinine, Ser: 0.5 mg/dL — ABNORMAL LOW (ref 0.61–1.24)
GFR calc Af Amer: >60 mL/min (ref >60)
GFR calc non Af Amer: >60 mL/min (ref >60)
Glucose, Bld: 80 mg/dL (ref 70–99)
Potassium: 3.6 mmol/L (ref 3.5–5.1)
Sodium: 139 mmol/L (ref 135–145)
Total Bilirubin: 0.5 mg/dL (ref 0.3–1.2)
Total Protein: 6 g/dL — ABNORMAL LOW (ref 6.5–8.1)

## 2020-03-23 MED ORDER — TRAMADOL HCL 50 MG PO TABS: 50.0000 mg | ORAL_TABLET | Freq: Two times a day (BID) | ORAL | 0 refills | Status: DC | PRN

## 2020-03-23 MED ORDER — HEPARIN SOD (PORK) LOCK FLUSH 100 UNIT/ML IV SOLN
500.0000 [IU] | Freq: Once | INTRAVENOUS | Status: AC
  Administered 2020-03-23: 500 [IU]
  Filled 2020-03-23: qty 5

## 2020-03-23 MED ORDER — ONDANSETRON HCL 4 MG PO TABS: 4.0000 mg | ORAL_TABLET | Freq: Three times a day (TID) | ORAL | 0 refills | Status: DC | PRN

## 2020-03-23 MED ORDER — MIRTAZAPINE 7.5 MG PO TABS: 7.5000 mg | ORAL_TABLET | Freq: Every day | ORAL | 0 refills | Status: DC

## 2020-03-23 MED ORDER — SODIUM CHLORIDE 0.9% FLUSH
10.0000 mL | Freq: Once | INTRAVENOUS | Status: AC
  Administered 2020-03-23: 10 mL
  Filled 2020-03-23: qty 10

## 2020-03-24 ENCOUNTER — Telehealth: Payer: Self-pay | Admitting: Hematology

## 2020-03-24 NOTE — Telephone Encounter (Signed)
Scheduled per 9/14 los. Called language line interpreter. Pt is aware of appt time and date.

## 2020-03-27 IMAGING — CT CT ANGIO CHEST
3 of 7 series · 18 of 36 positions shown · IV contrast (OMNIPAQUE 350)
Comparison: None.

CLINICAL DATA: Short of breath respiratory distress. Concern for
pulmonary embolism.

EXAM:
CT ANGIOGRAPHY CHEST WITH CONTRAST
TECHNIQUE: Multidetector CT imaging of the chest was performed using the
standard protocol during bolus administration of intravenous
contrast. Multiplanar CT image reconstructions and MIPs were
obtained to evaluate the vascular anatomy.
CONTRAST:  100mL OMNIPAQUE IOHEXOL 350 MG/ML SOLN

[Series 6: thins · axial · 0.70mm/px · z∈[-336,-15]mm · 12 of 381 slices shown]
[im 30/381  lung]
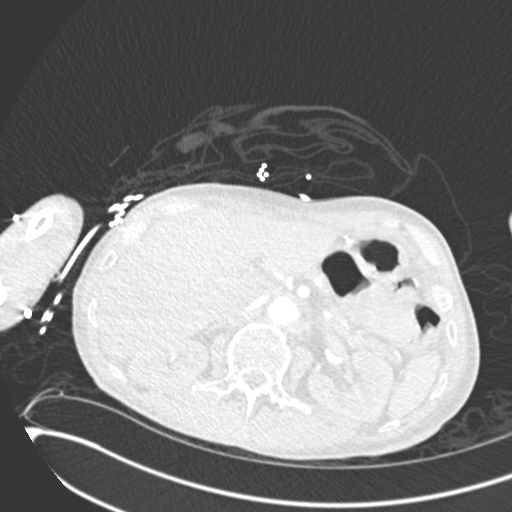
[im 59/381  mediastinal]
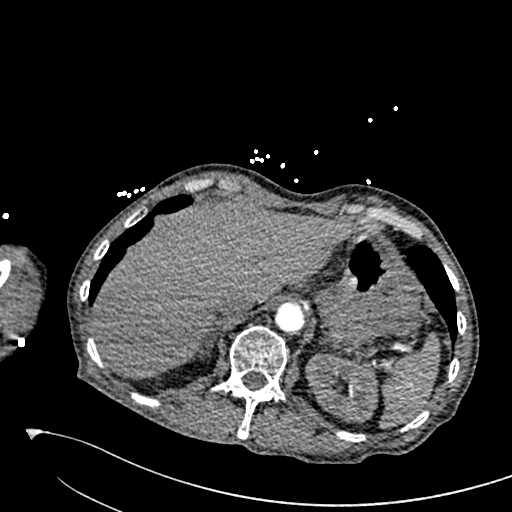
[im 88/381  lung]
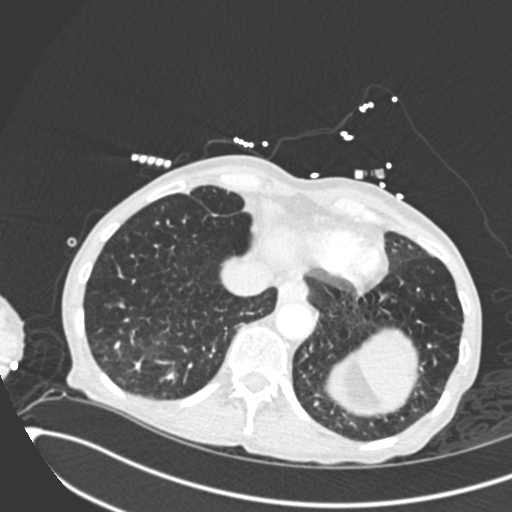
[im 117/381  mediastinal]
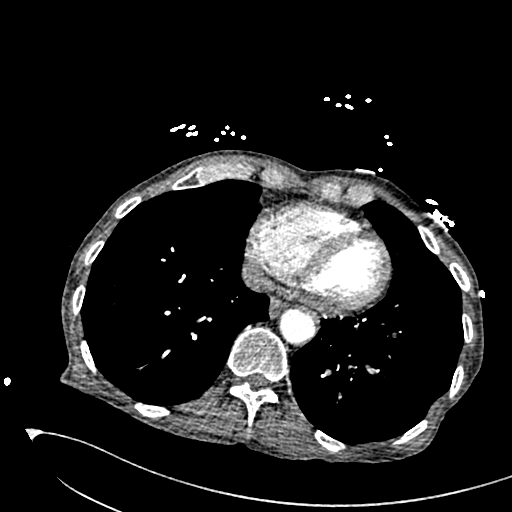
[im 147/381  lung]
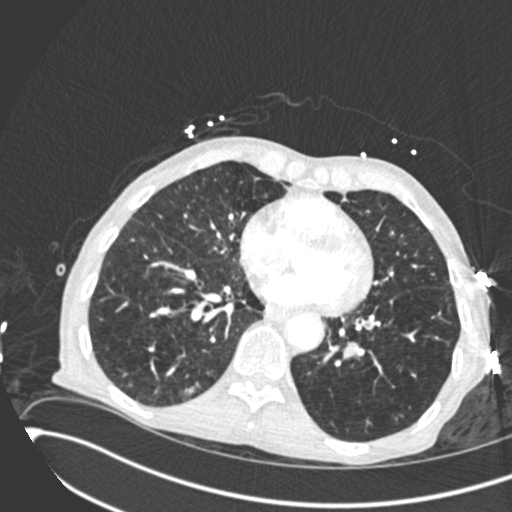
[im 176/381  mediastinal]
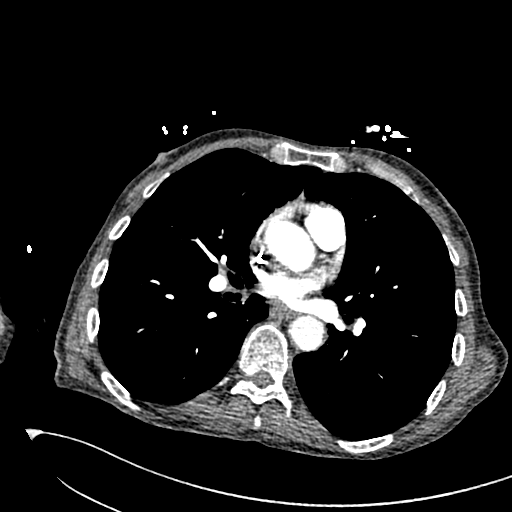
[im 205/381  lung]
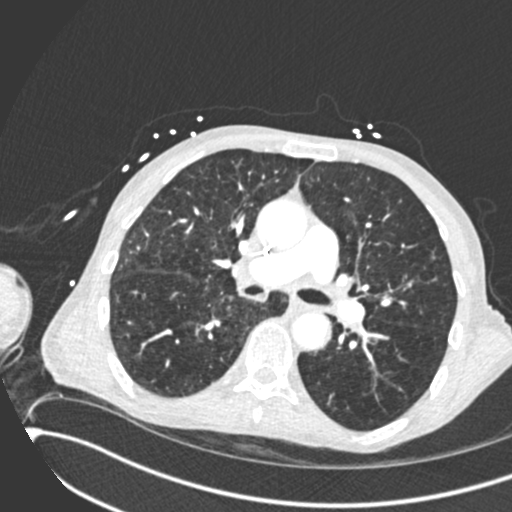
[im 234/381  mediastinal]
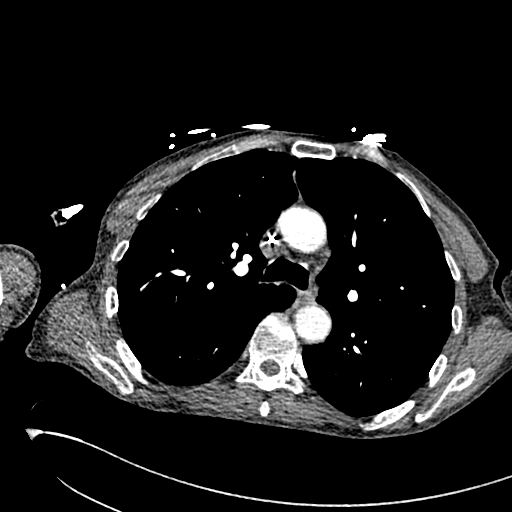
[im 264/381  lung]
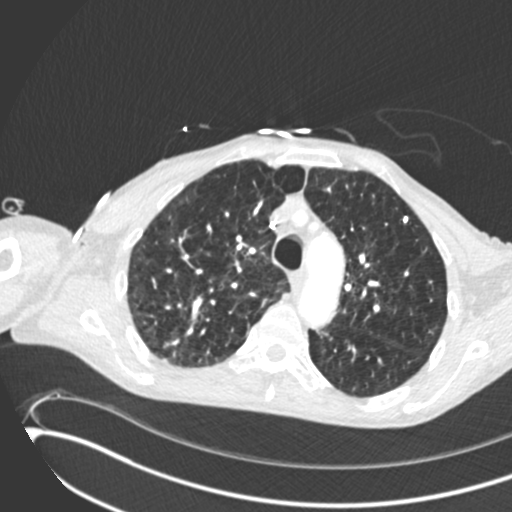
[im 293/381  mediastinal]
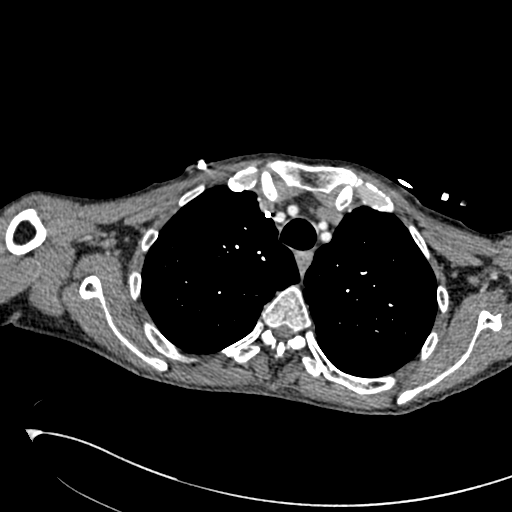
[im 322/381  lung]
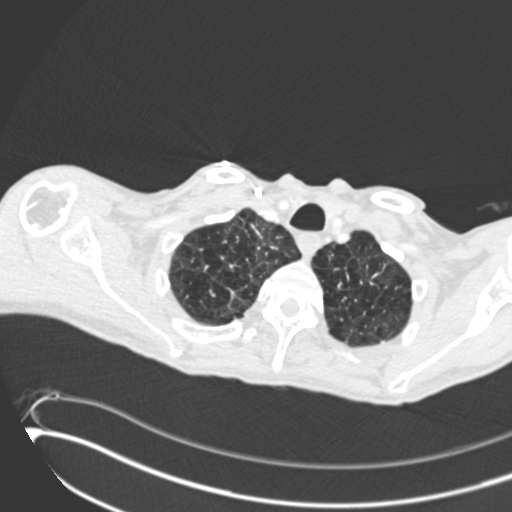
[im 351/381  mediastinal]
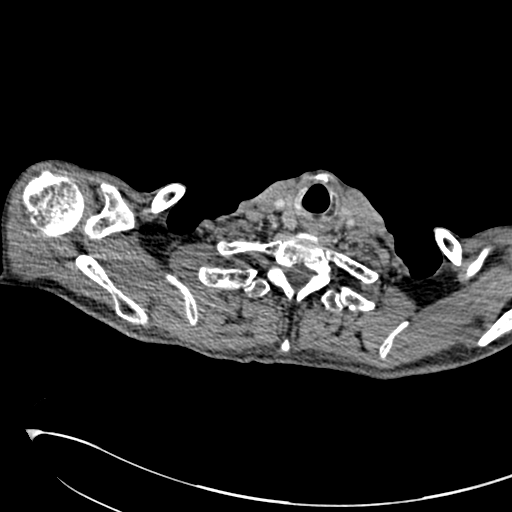

[Series 7: lung · axial · 0.70mm/px · z∈[-287,-47]mm · 5 of 182 slices shown]
[im 31/182  mediastinal]
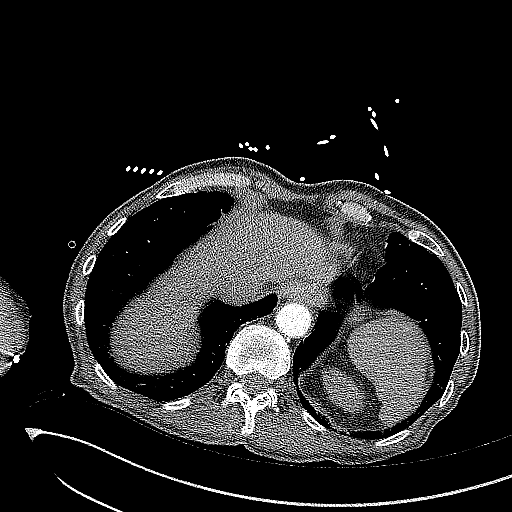
[im 61/182  mediastinal]
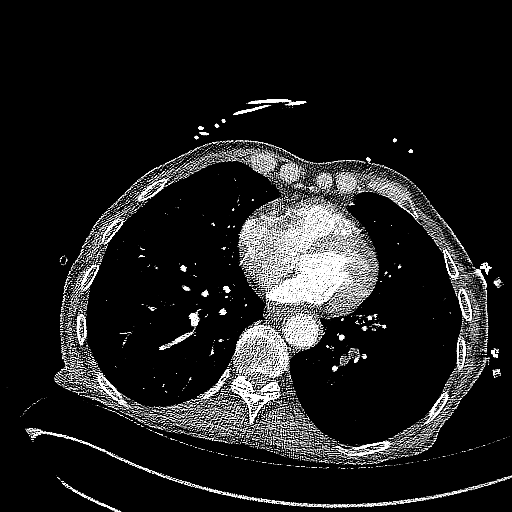
[im 91/182  mediastinal]
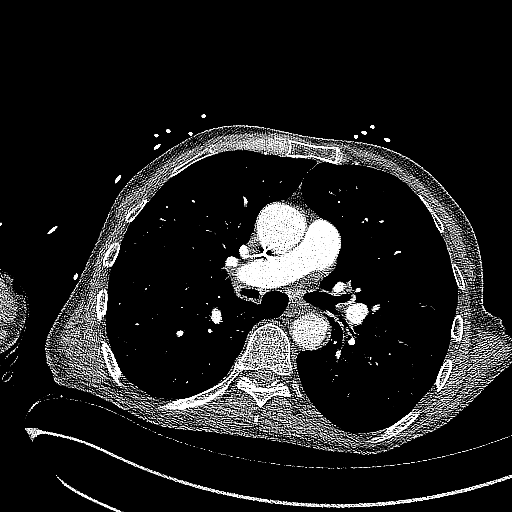
[im 121/182  mediastinal]
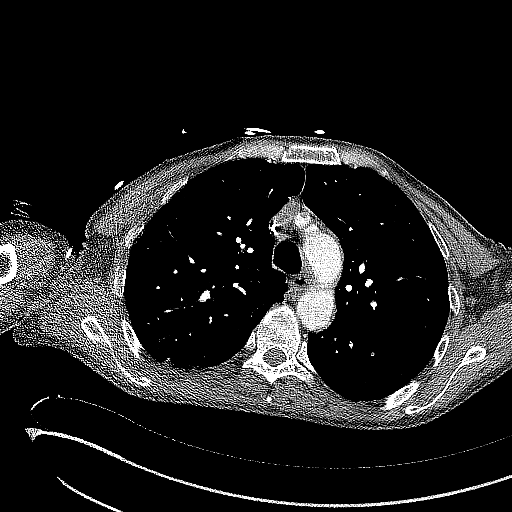
[im 151/182  mediastinal]
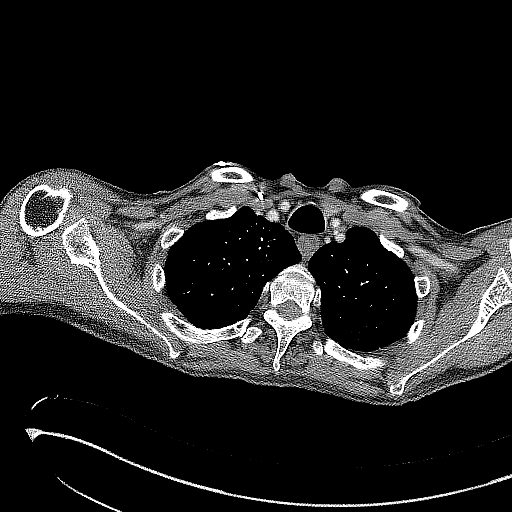

[Series 8: coronal mpr · coronal · 0.65mm/px · 1 of 136 slices shown]
[im 68/136  mediastinal]
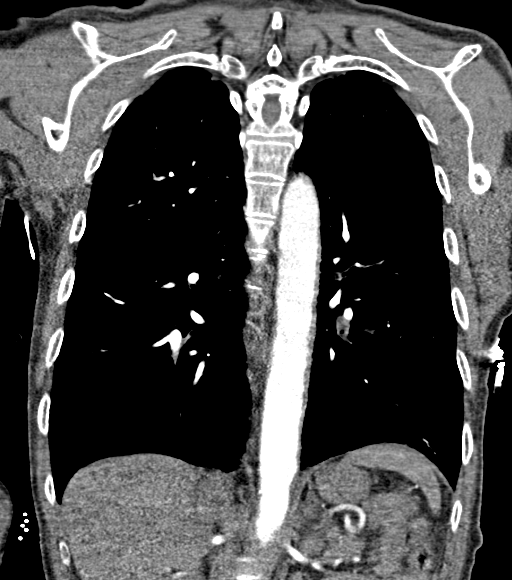

[18 of 36 positions shown; findings below may reference images not displayed]

FINDINGS: Cardiovascular: No filling defects within the pulmonary arteries
arteries to suggest acute pulmonary embolism.

Mediastinum/Nodes: No axillary supraclavicular adenopathy. No
mediastinal hilar adenopathy. No pericardial fluid. Port in the
anterior chest wall with tip in distal SVC.

Lungs/Pleura: Centrilobular emphysema. No suspicious pulmonary
nodularity. Stable small LEFT upper lobe nodule measuring 5 mm
(image [DATE]).

Mild bronchial thickening and filling in the LEFT lower lobe
bronchial tree (image 133/7, image 115/7)

Upper Abdomen: Limited view of the liver, kidneys, pancreas are
unremarkable. Normal adrenal glands.

Musculoskeletal: No aggressive osseous lesion.

Review of the MIP images confirms the above findings.
IMPRESSION: 1. No acute pulmonary embolism.
2. New bronchial thickening in the LEFT lower lobe representing
mucous plugging versus bronchitis. Favor mucous plugging.
3. Extensive centrilobular emphysema.
4. Stable small LEFT upper lobe pulmonary nodule.

## 2020-03-30 ENCOUNTER — Telehealth: Payer: Self-pay | Admitting: Pulmonary Disease

## 2020-03-30 NOTE — Telephone Encounter (Signed)
Pt's plan of care from [facility] has been located, signed by AO, and faxed to provided fax number on fax. Nothing further needed.  

## 2020-03-31 ENCOUNTER — Ambulatory Visit (HOSPITAL_COMMUNITY): Payer: Medicare Other

## 2020-03-31 NOTE — Progress Notes (Addendum)
Bobby Caldwell   [phone] Fax:([phone]) [phone]   Clinic Follow up Note   Patient Care Team: [name], MD as PCP - General (Hematology) [name], MD as Consulting Physician (Pulmonary Disease) [name], MD as Attending Physician (Gastroenterology) [name], MD as Consulting Physician (General Surgery) Laurin Coder, MD as Consulting Physician (Pulmonary Disease) 04/01/2020  CHIEF COMPLAINT: Follow-up gastric adenocarcinoma  SUMMARY OF ONCOLOGIC HISTORY: Oncology History Overview Note  Cancer Staging Gastric cancer [facility]) Staging form: Stomach, AJCC 8th Edition - Clinical stage from 05/24/2017: Stage I (cT2, cN0, cM0) - Signed by [name], MD on 06/19/2017 - Pathologic stage from 11/27/2017: Stage I (ypT1b, pN0, cM0) - Signed by [name], MD on 04/04/2018     Gastric adenocarcinoma ([place])  05/24/2017 Procedure   EGD by Dr. [name] 05/24/17 IMPRESSION Ulcerated mass like region in the distal stomach (4cm across), extensively biopsied. This appears neoplastic. - Abnormal mucosa throughout stomach otherwise (mild-moderate gastritis), also extensively biopsied.   05/24/2017 Initial Biopsy   Diagnosis 05/24/17  1. Stomach, biopsy, mass distal - ADENOCARCINOMA, SEE COMMENT. - CHRONIC ACTIVE GASTRITIS WITH INTESTINAL METAPLASIA. 2. 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 - NO DYSPLASIA OR MALIGNANCY   06/04/2017 Initial Diagnosis   Gastric cancer ([place])   06/07/2017 Procedure   EUS by Dr. [name] on 06/07/17  IMPRESSION:  5cm across, partially circumferential uT2N0 gastric adenocarcinoma laying in the distal stomach 3-4cm proximal to the pylorus.   06/22/2017 Imaging   CT CAP W Contrast 06/22/17  IMPRESSION: 1. The gastric mass is somewhat inconspicuous on CT, but a potential 1.2 cm thick mucosal masses seen along the posterior gastric border on  image 83/2. There several small adjacent right gastric lymph nodes measuring up to 0.7 cm in short axis, but no overtly pathologic adenopathy and no findings of hepatic or peritoneal metastatic disease at this time. 2. Calcified granulomas in both lungs along with several noncalcified nodules stable from August 2016 and likely benign given this long-term stability. 3. Other imaging findings of potential clinical significance: Aortic Atherosclerosis (ICD10-I70.0) and Emphysema (ICD10-J43.9). Airway thickening is present, suggesting bronchitis or reactive airways disease. 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 Disc bulges at L3-4 and L4-5.   07/05/2017 Surgery   PAC placement    07/07/2017 - 09/06/2017 Chemotherapy   Neoadjuvant FOLFOX with with leucovorin every 2 weeks. Plan for 6 cycles but stopped after 4 cycles due to poor tolerance     09/10/2017 Imaging    CT AP W Contrast 09/10/17 IMPRESSION: 1. No acute findings identified within the abdomen or pelvis. 2. Interval development of multiple bilateral pulmonary densities within both lower lobes. These are indeterminate, likely post infectious or inflammatory in etiology. Atypical infection not excluded. Metastatic disease is considered less favored but not entirely excluded and follow-up imaging to ensure resolution is advised. 3. No evidence to suggest recurrent tumor or metastatic disease within the abdomen or pelvis. 4.  Aortic Atherosclerosis (ICD10-I70.0).   11/27/2017 Surgery    Surgery He had partial gastrectomy with Dr. [name] on 11/27/2017.   11/27/2017 Pathology Results   11/27/2017 Surgical Pathology Diagnosis 1. Stomach, resection for tumor, Distal w/ Omentum - ADENOCARCINOMA, MODERATELY DIFFERENTIATED, 3.8 CM - LYMPHOVASCULAR SPACE AND PERINEURAL INVASION - NO CARCINOMA IDENTIFIED IN FOUR LYMPH NODES (0/4) - SEE ONCOLOGY TABLE AND  COMMENT BELOW 2. Lymph node, biopsy, Portal - NO  CARCINOMA IDENTIFIED IN ONE LYMPH NODE (0/1) - SEE COMMENT 3. Lymph node, biopsy, Left Gastric - NO CARCINOMA IDENTIFIED IN TWO LYMPH NODES (0/2) - SEE COMMENT 4. Omentum, resection for tumor - BENIGN FIBROADIPOSE TISSUE - NO CARCINOMA IDENTIFIED    11/27/2017 Cancer Staging   Staging form: Stomach, AJCC 8th Edition - Pathologic stage from 11/27/2017: Stage I (ypT1b, pN0, cM0) - Signed by [name], MD on 04/04/2018   08/14/2018 Imaging   CT CAP 08/14/18 IMPRESSION:  1. No findings of active malignancy. 2. Generally stable pulmonary nodules are identified in the lungs. The only new nodule seen today is a 3 mm right apical nodule on image 32/4 which may merit surveillance. Many of the basilar nodules shown on the prior CT abdomen from 09/10/2017 have resolved. 3. Other imaging findings of potential clinical significance: Aortic Atherosclerosis (ICD10-I70.0). Emphysema (ICD10-J43.9). Lumbar degenerative disc disease.   08/22/2018 Procedure   Upper endoscopy 08/22/18 by Dr Verdie Drown The esophagus was slightly tortuous but there were no strictures/stenosis and the mucosa was normal throughout. The gastro-jejunostomy site was widely patent, Bilroth II anatomy. There was a 1.5-2cm region of the anastomosis that was discretely abnormal, soft but somewhat neoplastic appearing and nodular. I biopsied this region extensively. Diagnosis Stomach, biopsy, GJ anastomosis erythema - LOW GRADE GLANDULAR DYSPLASIA. - GOBLET CELL METAPLASIA. - SEE COMMENT.   11/28/2018 Procedure   Upper Endoscopy 11/28/18 by Dr Verdie Drown - Bilroth II anatomy. The previously noted adenomatous polyp was again located at the [place] anastomosis. It was soft, sessile, about 1.9cm across in greatest dimension. I used mixed cold/cautery snare resection to (apparently) completely remove the polyp. Diagnosis Stomach, polyp(s) - LOW GRADE GLANDULAR  DYSPLASIA. - GOBLET CELL METAPLASIA.   07/31/2019 Procedure   Upper Endoscopy by Dr. [name] 07/31/19  IMPRESSION - Typical Bilroth II anatomy. The GJ anastomosis was slightly inflammed, but not overtly neoplastic appearing. No clear recurrent or residual polyps. The anastomosis was sampled at the sight of most significant appearing inflammation (1-2cm section, see images). - Afferent and efferent limbs were intubated briefly and appeared normal. - Mild candida esophagitis, not sampled. Will start empiric diflucan course (100mg  daily for 10 days) - The examination was otherwise normal. FINAL MICROSCOPIC DIAGNOSIS:   A.  IRREGULAR MUCOSA AT GJ JUNCTION, BIOPSY:  -Anastomosis site with peptic change and low grade dysplasia.    04/01/2020 Imaging   CT CAP IMPRESSION: 1. Redemonstrated postoperative findings of distal gastrectomy and gastric jejunostomy. No evidence of malignant recurrence. 2. There is incidental note of intussusception of the jejunum into the gastric lumen, likely transient and peristaltic in nature. 3. Stable small pulmonary nodules scattered throughout the lungs. Attention on follow-up. 4. Additional scattered, clustered centrilobular and tree-in-bud nodules in the dependent right lower lobe, most consistent with aspiration given the presence of frothy debris in the airways. 5. Severe emphysema and diffuse bilateral bronchial wall thickening. Emphysema (ICD10-J43.9). 6. Coronary artery disease. 7. 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 Aortic Atherosclerosis (ICD10-I70.0).     CURRENT THERAPY: Surveillance  INTERVAL HISTORY: Bobby Caldwell returns for follow-up as scheduled he is here with his daughter.  Presents in a wheelchair.  He had a CT scan this morning.  Since last visit, he took tramadol and mirtazapine once and was "not himself, out of  it."  He felt disoriented and drowsy, took him 4 days to recover to  his baseline.  Appetite remains low.  Able to keep food and liquids down but has gagging and regurgitation after coughing spells.  Abdominal pain is "8.5 out of 10 "and worse at night.  Bowels moving normally.  No change to his cough.  He remains weak with dyspnea on minimal exertion.    MEDICAL HISTORY:  Past Medical History:  Diagnosis Date   COPD (chronic obstructive pulmonary disease) ([place])    Dyspnea    PAD (peripheral artery disease) ([facility])    Stomach cancer ([place])    Tobacco abuse    Weight loss     SURGICAL HISTORY: Past Surgical History:  Procedure Laterality Date   ABDOMINAL AORTOGRAM W/LOWER EXTREMITY N/A 05/03/2017   Procedure: ABDOMINAL AORTOGRAM W/LOWER EXTREMITY;  Surgeon: Conrad Bradford, MD;  Location: [facility];  Service: Cardiovascular;  Laterality: N/A;   BIOPSY  08/22/2018   Procedure: BIOPSY;  Surgeon: [name], MD;  Location: [facility];  Service: Endoscopy;;   BIOPSY  07/31/2019   Procedure: BIOPSY;  Surgeon: [name], MD;  Location: [facility];  Service: Endoscopy;;   ESOPHAGOGASTRODUODENOSCOPY     05/24/17   ESOPHAGOGASTRODUODENOSCOPY (EGD) WITH PROPOFOL N/A 05/24/2017   Procedure: ESOPHAGOGASTRODUODENOSCOPY (EGD) WITH PROPOFOL;  Surgeon: [name], MD;  [facility];  Service: Endoscopy;  Laterality: N/A;   ESOPHAGOGASTRODUODENOSCOPY (EGD) WITH PROPOFOL N/A 08/22/2018   Procedure: ESOPHAGOGASTRODUODENOSCOPY (EGD) WITH PROPOFOL;  Surgeon: [name], MD;  [facility];  Service: Endoscopy;  Laterality: N/A;   ESOPHAGOGASTRODUODENOSCOPY (EGD) WITH PROPOFOL N/A 11/28/2018   Procedure: ESOPHAGOGASTRODUODENOSCOPY (EGD) WITH PROPOFOL;  Surgeon: [name], MD;  [facility];  Service: Endoscopy;  Laterality: N/A;   ESOPHAGOGASTRODUODENOSCOPY (EGD) WITH PROPOFOL N/A 07/31/2019   Procedure: ESOPHAGOGASTRODUODENOSCOPY  (EGD) WITH PROPOFOL;  Surgeon: [name], MD;  [facility]: [facility];  Service: Endoscopy;  Laterality: N/A;   EUS N/A 06/07/2017   Procedure: UPPER ENDOSCOPIC ULTRASOUND (EUS) RADIAL;  Surgeon: [name], MD;  [facility];  Service: Endoscopy;  Laterality: N/A;   FEMORAL-FEMORAL BYPASS GRAFT Bilateral 05/08/2017   Procedure: LEFT TO RIGHT BYPASS GRAFT FEMORAL-FEMORAL ARTERY;  Surgeon: Conrad [place], MD;  Location: [place];  Service: Vascular;  Laterality: Bilateral;   IR FLUORO GUIDE PORT INSERTION RIGHT  07/05/2017   IR US GUIDE VASC ACCESS RIGHT  07/05/2017   LAPAROSCOPIC INSERTION GASTROSTOMY TUBE N/A 11/27/2017   Procedure: JEJUNOSTOMY TUBE PLACEMENT;  Surgeon: [name], MD;  Location: [place];  Service: General;  Laterality: N/A;   LAPAROSCOPIC PARTIAL GASTRECTOMY N/A 11/27/2017   Procedure: PARTIAL GASTRECTOMY;  Surgeon: [name], MD;  Location: [place];  Service: General;  Laterality: N/A;   LAPAROSCOPY N/A 11/27/2017   Procedure: LAPAROSCOPY DIAGNOSTIC;  Surgeon: [name], MD;  Location: [place];  Service: General;  Laterality: N/A;   NM MYOVIEW LTD  06/2017   LOW RISK. Small basal inferoseptal defect thought to be related to artifact (although cannot exclude prior infarct). Normal wall motion i in this area would suggest artifact not infarct. Otherwise no ischemia or infarction.   PERIPHERAL VASCULAR INTERVENTION Left 05/03/2017   Procedure: PERIPHERAL VASCULAR INTERVENTION;  Surgeon: Conrad [place], MD;  Location: [facility];  Service: Cardiovascular;  Laterality: Left;  common iliac   POLYPECTOMY  11/28/2018   Procedure: POLYPECTOMY;  Surgeon: [name], MD;  [facility];  Service: Endoscopy;;   TRANSTHORACIC ECHOCARDIOGRAM  06/26/2017   Normal LV size and function. EF  60-65%. No wall motion abnormalities. GR 1 DD. Mild LA dilation    I have reviewed the social history and family history with the patient and they are  unchanged from previous note.  ALLERGIES:  has No Known Allergies.  MEDICATIONS:  Current Outpatient Medications  Medication Sig Dispense Refill   Budeson-Glycopyrrol-Formoterol (BREZTRI AEROSPHERE) 160-9-4.8 MCG/ACT AERO Inhale 2 puffs into the lungs 2 (two) times daily. 5.9 g 0   budesonide-formoterol (SYMBICORT) 160-4.5 MCG/ACT inhaler Inhale 2 puffs into the lungs 2 (two) times daily. 6 g 5   feeding supplement, ENSURE ENLIVE, (ENSURE ENLIVE) LIQD Take 237 mLs by mouth 2 (two) times daily between meals. 14222 mL 0   gabapentin (NEURONTIN) 300 MG/6ML solution Take 6 mLs (300 mg total) by mouth 2 (two) times daily. 470 mL 2   ipratropium-albuterol (DUONEB) 0.5-2.5 (3) MG/3ML SOLN Inhale 3 mLs into the lungs 4 (four) times daily. Dx:J44.9 360 mL 5   mirtazapine (REMERON) 7.5 MG tablet Take 1 tablet (7.5 mg total) by mouth at bedtime. 30 tablet 0   Multiple Vitamin (MULTIVITAMIN WITH MINERALS) TABS tablet Take 1 tablet by mouth daily. 30 tablet 0   ondansetron (ZOFRAN) 4 MG tablet Take 1 tablet (4 mg total) by mouth every 8 (eight) hours as needed for nausea or vomiting. 15 tablet 0   oxyCODONE-acetaminophen (PERCOCET) 7.5-325 MG tablet Take 1 tablet by mouth every 6 (six) hours as needed for severe pain. 30 tablet 0   OXYGEN Inhale 2 L into the lungs continuous.     Tiotropium Bromide Monohydrate (SPIRIVA RESPIMAT) 2.5 MCG/ACT AERS Inhale 2 puffs into the lungs daily. 4 g 5   traMADol (ULTRAM) 50 MG tablet Take 1 tablet (50 mg total) by mouth every 12 (twelve) hours as needed. 30 tablet 0   megestrol (MEGACE ES) 625 MG/5ML suspension Take 5 mLs (625 mg total) by mouth daily. 150 mL 0   No current facility-administered medications for this visit.    PHYSICAL EXAMINATION: ECOG PERFORMANCE STATUS: 3 - Symptomatic, >50% confined to bed  Vitals:   04/01/20 0944  BP: 135/83  Pulse: 85  Resp: 18  Temp: 97.9 F (36.6 C)  SpO2: 93%   Filed Weights   04/01/20 0944  Weight:  87 lb 3.2 oz (39.6 kg)    GENERAL:alert, no distress and comfortable SKIN: No rash to exposed skin EYES: sclera clear LUNGS: Decreased with normal breathing effort HEART: regular rate & rhythm, no lower extremity edema ABDOMEN:abdomen soft, non-tender and normal bowel sounds NEURO: alert & oriented x 3 with fluent speech Exam performed in wheelchair   LABORATORY DATA:  I have reviewed the data as listed CBC Latest Ref Rng & Units 03/23/2020 11/18/2019 10/15/2019  WBC 4.0 - 10.5 K/uL 7.8 7.0 7.4  Hemoglobin 13.0 - 17.0 g/dL 12.4(L) 13.7 12.3(L)  Hematocrit 39 - 52 % 37.9(L) 44.1 38.7(L)  Platelets 150 - 400 K/uL 207 252 235     CMP Latest Ref Rng & Units 03/23/2020 11/18/2019 10/15/2019  Glucose 70 - 99 mg/dL 80 83 144(H)  BUN 8 - 23 mg/dL 6(L) 5(L) 12  Creatinine 0.61 - 1.24 mg/dL 0.50(L) 0.62 0.44(L)  Sodium 135 - 145 mmol/L 139 134(L) 140  Potassium 3.5 - 5.1 mmol/L 3.6 3.9 4.3  Chloride 98 - 111 mmol/L 98 93(L) 94(L)  CO2 22 - 32 mmol/L 34(H) 33(H) 39(H)  Calcium 8.9 - 10.3 mg/dL 8.4(L) 8.4(L) 8.1(L)  Total Protein 6.5 - 8.1 g/dL 6.0(L) - -  Total Bilirubin 0.3 -  1.2 mg/dL 0.5 - -  Alkaline Phos 38 - 126 U/L 80 - -  AST 15 - 41 U/L 13(L) - -  ALT 0 - 44 U/L 11 - -      RADIOGRAPHIC STUDIES: I have personally reviewed the radiological images as listed and agreed with the findings in the report. CT Chest W Contrast  Result Date: 04/01/2020 CLINICAL DATA:  Gastric cancer restaging EXAM: CT CHEST, ABDOMEN, AND PELVIS WITH CONTRAST TECHNIQUE: Multidetector CT imaging of the chest, abdomen and pelvis was performed following the standard protocol during bolus administration of intravenous contrast. CONTRAST:  133mL OMNIPAQUE IOHEXOL 300 MG/ML  SOLN COMPARISON:  CT chest angiogram, 11/19/2019, CT chest abdomen pelvis, 08/18/2019 FINDINGS: CT CHEST FINDINGS Cardiovascular: Right chest port catheter. Aortic atherosclerosis. Normal heart size. Scattered coronary artery calcifications. No  pericardial effusion. Mediastinum/Nodes: No enlarged mediastinal, hilar, or axillary lymph nodes. Thyroid gland, trachea, and esophagus demonstrate no significant findings. Lungs/Pleura: Severe centrilobular emphysema. Diffuse bilateral bronchial wall thickening. Frothy debris in the right mainstem bronchus and left lower lobe bronchi. Stable pulmonary nodule measuring 5 mm in the anterior right middle lobe (series 3, image 117). Stable 3 mm pulmonary nodule in the central right lower lobe (series 3, image 126). Stable 5 mm nodule of the anterior left lower lobe abutting the fissure (series 3, image 128). Additional scattered, clustered centrilobular and tree-in-bud nodules in the dependent right lower lobe (series 3, image 142). No pleural effusion or pneumothorax. Musculoskeletal: No chest wall mass or suspicious bone lesions identified. CT ABDOMEN PELVIS FINDINGS Hepatobiliary: No focal liver abnormality is seen. No gallstones, gallbladder wall thickening, or biliary dilatation. Pancreas: Unremarkable. No pancreatic ductal dilatation or surrounding inflammatory changes. Spleen: Normal in size without significant abnormality. Adrenals/Urinary Tract: Adrenal glands are unremarkable. Kidneys are normal, without renal calculi, solid lesion, or hydronephrosis. Bladder is unremarkable. Stomach/Bowel: Redemonstrated postoperative findings of distal gastrectomy and gastric jejunostomy. Incidental note of intussusception of the jejunum into the gastric lumen (series 2, image 72) appendix appears normal. No evidence of bowel wall thickening, distention, or inflammatory changes. 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 No enlarged abdominal or pelvic lymph nodes. Reproductive: No mass or other abnormality. Other: No abdominal wall hernia or abnormality. No abdominopelvic ascites.  Musculoskeletal: No acute or significant osseous findings. IMPRESSION: 1. Redemonstrated postoperative findings of distal gastrectomy and gastric jejunostomy. No evidence of malignant recurrence. 2. There is incidental note of intussusception of the jejunum into the gastric lumen, likely transient and peristaltic in nature. 3. Stable small pulmonary nodules scattered throughout the lungs. Attention on follow-up. 4. Additional scattered, clustered centrilobular and tree-in-bud nodules in the dependent right lower lobe, most consistent with aspiration given the presence of frothy debris in the airways. 5. Severe emphysema and diffuse bilateral bronchial wall thickening. Emphysema (ICD10-J43.9). 6. Coronary artery disease. 7. 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 Aortic Atherosclerosis (ICD10-I70.0). Electronically Signed   By: Eddie Candle M.D.   On: 04/01/2020 10:45   CT Abdomen Pelvis W Contrast  Result Date: 04/01/2020 CLINICAL DATA:  Gastric cancer restaging EXAM: CT CHEST, ABDOMEN, AND PELVIS WITH CONTRAST TECHNIQUE: Multidetector CT imaging of the chest, abdomen and pelvis was performed following the standard protocol during bolus administration of intravenous contrast. CONTRAST:  12mL OMNIPAQUE IOHEXOL 300 MG/ML  SOLN COMPARISON:  CT chest angiogram, 11/19/2019, CT chest abdomen pelvis, 08/18/2019 FINDINGS: CT CHEST FINDINGS Cardiovascular: Right chest port catheter. Aortic atherosclerosis. Normal heart size. Scattered coronary artery calcifications. No pericardial effusion. Mediastinum/Nodes: No enlarged mediastinal, hilar, or axillary lymph nodes. Thyroid gland, trachea, and esophagus demonstrate no significant findings. Lungs/Pleura: Severe centrilobular emphysema. Diffuse bilateral bronchial wall thickening. Frothy debris in the right mainstem bronchus and left lower lobe  bronchi. Stable pulmonary nodule measuring 5 mm in the anterior right middle lobe (series 3, image 117). Stable 3 mm pulmonary nodule in the central right lower lobe (series 3, image 126). Stable 5 mm nodule of the anterior left lower lobe abutting the fissure (series 3, image 128). Additional scattered, clustered centrilobular and tree-in-bud nodules in the dependent right lower lobe (series 3, image 142). No pleural effusion or pneumothorax. Musculoskeletal: No chest wall mass or suspicious bone lesions identified. CT ABDOMEN PELVIS FINDINGS Hepatobiliary: No focal liver abnormality is seen. No gallstones, gallbladder wall thickening, or biliary dilatation. Pancreas: Unremarkable. No pancreatic ductal dilatation or surrounding inflammatory changes. Spleen: Normal in size without significant abnormality. Adrenals/Urinary Tract: Adrenal glands are unremarkable. Kidneys are normal, without renal calculi, solid lesion, or hydronephrosis. Bladder is unremarkable. Stomach/Bowel: Redemonstrated postoperative findings of distal gastrectomy and gastric jejunostomy. Incidental note of intussusception of the jejunum into the gastric lumen (series 2, image 72) appendix appears normal. No evidence of bowel wall thickening, distention, or inflammatory changes. 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 No enlarged abdominal or pelvic lymph nodes. Reproductive: No mass or other abnormality. Other: No abdominal wall hernia or abnormality. No abdominopelvic ascites. Musculoskeletal: No acute or significant osseous findings. IMPRESSION: 1. Redemonstrated postoperative findings of distal gastrectomy and gastric jejunostomy. No evidence of malignant recurrence. 2. There is incidental note of intussusception of the jejunum into the gastric lumen, likely transient and peristaltic in nature. 3.  Stable small pulmonary nodules scattered throughout the lungs. Attention on follow-up. 4. Additional scattered, clustered centrilobular and tree-in-bud nodules in the dependent right lower lobe, most consistent with aspiration given the presence of frothy debris in the airways. 5. Severe emphysema and diffuse bilateral bronchial wall thickening. Emphysema (ICD10-J43.9). 6. Coronary artery disease. 7. 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 Aortic Atherosclerosis (ICD10-I70.0). Electronically Signed   By: Eddie Candle M.D.   On: 04/01/2020 10:45     ASSESSMENT & PLAN: Bobby Caldwell is a 64 y.o. male with    1. Gastric Adenocarcinoma, uT2N0M0, stage I, ypT1bN0 -Diagnosed in 05/2017. Treated withneoadjuvantchemo and surgery. Currently on surveillance. -His Upper Endoscopy with Dr [name] from 07/31/19 showsno evidence of recurrence, mild esophagitis, which was treated with fluconazole. CT CAP form 2/8/21showed NED  2. Abdominal pain, weight loss, fatigue  -in 03/2020  -did not tolerate tramadol or mirtazapine, will try megace   2. COPD, Smoking Cessation -on continuous O2, f/u with pulmonologist Dr. Ander Slade  3.Depression and or anxiety -Currently on Ativan as needed. Continue and monitor. -did not tolerate mirtazapine   5. COVID19 (+)  -He tested positive for COVID19 on 06/23/19.He was quite symptomatic, but did not require hospitalization. -recovered now  Disposition:  Bobby Caldwell is chronically ill appearing but stable. He remains weak and frail. He did not tolerate tramadol or mirtazapine. Will try megace for appetite stimulation.   We reviewed the CT CAP  from today which shows no evidence of cancer recurrence or metastasis. Pulmonary nodules are stable. There was a finding of intussusception of the jejunum into the gastric lumen. We are referring him back to  Dr. [name] for further work up and management of abdominal pain. CT also showed severe emphysema and aortic atherosclerosis, we recommend continued f/u with PCP and pulmonary.    We will see him back for lab and f/u in 2 months. Patient seen with Dr. Burr Medico.   All questions were answered. The patient knows to call the clinic with any problems, questions or concerns. No barriers to learning were detected.     Alla Feeling, NP 04/01/20   Addendum  I have seen the patient, examined him. I agree with the assessment and and plan and have edited the notes.   I personally reviewed his restaging CT scan images and discussed with patient and his daughter.  Scan did not show evidence of cancer recurrence, however due to his significant weight loss, abdominal pain and anorexia, local recurrence is still concerned.  CT also showedintussusception of the jejunum into the gastric lumen, not sure if this is transient or persistent, or any relationship with his symptoms.  I will refer him back to Dr. [name] for evaluation and management.  His last EGD was done in January 2021 which was negative for recurrence, he may need a repeated EGD due to his symptoms. His small lung nodules are stable on scan, likely benign.  Patient did not tolerate mirtazapine due to the drowsiness, I will call in Megace for his anorexia.  I encouraged him to continue nutritional supplement.  [name]  04/01/2020

## 2020-04-01 ENCOUNTER — Encounter: Payer: Self-pay | Admitting: Nurse Practitioner

## 2020-04-01 ENCOUNTER — Ambulatory Visit (HOSPITAL_COMMUNITY)
Admission: RE | Admit: 2020-04-01 | Discharge: 2020-04-01 | Disposition: A | Discharge status: Home / Self Care | Payer: Medicare Other | Source: Ambulatory Visit | Attending: Hematology | Admitting: Hematology

## 2020-04-01 ENCOUNTER — Other Ambulatory Visit: Payer: Self-pay

## 2020-04-01 ENCOUNTER — Inpatient Hospital Stay (HOSPITAL_BASED_OUTPATIENT_CLINIC_OR_DEPARTMENT_OTHER): Payer: Medicare Other | Admitting: Nurse Practitioner

## 2020-04-01 VITALS — BP 135/83 | HR 85 | Temp 97.9°F | Resp 18 | Ht 65.0 in | Wt 87.2 lb

## 2020-04-01 DIAGNOSIS — I70213 Atherosclerosis of native arteries of extremities with intermittent claudication, bilateral legs: Secondary | ICD-10-CM | POA: Diagnosis not present

## 2020-04-01 DIAGNOSIS — C169 Malignant neoplasm of stomach, unspecified: Secondary | ICD-10-CM

## 2020-04-01 DIAGNOSIS — Z85028 Personal history of other malignant neoplasm of stomach: Secondary | ICD-10-CM | POA: Diagnosis not present

## 2020-04-01 MED ORDER — IOHEXOL 300 MG/ML  SOLN
100.0000 mL | Freq: Once | INTRAMUSCULAR | Status: AC | PRN
  Administered 2020-04-01: 100 mL via INTRAVENOUS

## 2020-04-01 MED ORDER — MEGESTROL ACETATE 625 MG/5ML PO SUSP: 625.0000 mg | Freq: Every day | ORAL | 0 refills | Status: DC

## 2020-04-02 ENCOUNTER — Telehealth: Payer: Self-pay

## 2020-04-02 ENCOUNTER — Telehealth: Payer: Self-pay | Admitting: Hematology

## 2020-04-02 ENCOUNTER — Encounter: Payer: Self-pay | Admitting: Nurse Practitioner

## 2020-04-02 NOTE — Telephone Encounter (Signed)
Bobby Banister, MD  Alla Feeling, NP; Timothy Lasso, RN I'm happy to see him back.    [name],  Can you contact him about OV, my first available.       Previous Messages   ----- Message -----  From: Alla Feeling, NP  Sent: 04/01/2020  4:19 PM EDT  To: Bobby Banister, MD, [name], MD   Dr. [name],  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 He has developed abdominal pain, anorexia and weight loss recently, down to 87 lbs. CT showed NED but incidental finding of intussusception of jejunum into gastric lumen. Can you see him back to discuss further eval/management.

## 2020-04-02 NOTE — Telephone Encounter (Signed)
The pt has been scheduled for an appt on 06/08/20 at 850 am with Dr [name].  He has been advised and verbalized understanding.

## 2020-04-02 NOTE — Telephone Encounter (Signed)
-----   Message from [name], MD sent at 04/01/2020  7:39 PM EDT ----- I'm happy to see him back.    Machael Raine, Can you contact him about OV, my first available. ----- Message ----- From: Alla Feeling, NP Sent: 04/01/2020   4:19 PM EDT To: [name], MD, [name], MD  Dr. [name],  Mr. 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 He has developed abdominal pain, anorexia and weight loss recently, down to 87 lbs. CT showed NED but incidental finding of intussusception of jejunum into gastric lumen. Can you see him back to discuss further eval/management.   Thanks, Regan Rakers

## 2020-04-02 NOTE — Telephone Encounter (Signed)
Scheduled per 9/23 los. Used Medical laboratory scientific officer. Pt is aware of appts on 11/22.

## 2020-04-05 ENCOUNTER — Encounter: Payer: Self-pay | Admitting: Gastroenterology

## 2020-04-06 ENCOUNTER — Telehealth: Payer: Self-pay | Admitting: Pulmonary Disease

## 2020-04-06 NOTE — Telephone Encounter (Signed)
PA request was received from (pharmacy): [facility] Phone:[phone] Fax: [phone] Medication name and strength: Ipratropium-Albuterol 0.5-2.5 Ordering Provider: Dr.Olalere  Was PA started with [facility]?: yes If yes, please enter KEY: [id number] Medication tried and failed:  Covered Alternatives:   PA sent to plan, time frame for approval / denial:  Routing to for follow-up

## 2020-04-06 NOTE — Telephone Encounter (Signed)
Spoke with [name] regarding patient PA is needed to his Nebulizer medication Duoneb.Advised Matijamie I will check for the fax from the pharmacy and will start PA. Will contact Matijamie once PA is done.

## 2020-04-06 NOTE — Telephone Encounter (Signed)
Pt's daughter called back stating she spoke with the pharmacy and they need an authorization for nebulizer medication for medicaid.  Requesting to be done ASAP since patient is out of the medication.  Please advise.

## 2020-04-06 NOTE — Telephone Encounter (Signed)
Please call daughter at [phone]

## 2020-04-06 NOTE — Telephone Encounter (Signed)
Spoke with [place] [employer] regarding prior message. [place] [employer] stated that patient's medication is approved with out a PA for Ipratropium-Albuterol 0.5-2.5 3 mg/3 ml. Called patient's pharmacy regarding this. PA needs to be done with cover my meds. Started PA.Left a voicemail for Bobby Caldwell to let her know I started PA and we will notify her once we get a answer.

## 2020-04-07 ENCOUNTER — Telehealth: Payer: Self-pay | Admitting: Pulmonary Disease

## 2020-04-07 NOTE — Telephone Encounter (Signed)
Patient's daughter called stating that insurance denied medication for his nebulizer.  Please advise.  Wanted a call back ASAP.  [phone]

## 2020-04-07 NOTE — Telephone Encounter (Signed)
Spoke with patient's daughter regarding prior Auth denied. Called and spoke to medicare regarding medication that was denied because medication needed to be under Medicare Part B plan. Patient's daughter was advised to have patient's pharmacy rerun medication to get approval. Patient's daughter's voice was understanding.

## 2020-04-07 NOTE — Telephone Encounter (Signed)
Checked status of PA via cover my meds and it states that it was denied.  The below question was answered wrong when PA was initiated: Part B Determination DME Inhalation DrugsPDP Clinical Standard Standard Prior Auth  Does the patient reside in a nursing facility or intermediate care facility? (3 or 9 on the patient residence code)  Yes  No  Not Applicable  Yes was selected but pt does not reside in a nursing facility as he resides at home.   Called [employer] and spoke with [place] in regards to the PA that had been submitted. Claiborne Billings stated that she did see that the PA was denied and I stated to her the above information. She stated she would try to transfer me through to the intake people to further discuss this to see if we could get the PA approved for pt.  While waiting to be transferred to the intake people to see if we could get the PA overturned and approved for pt, the call was disconnected. Will try to call back.

## 2020-04-08 MED ORDER — IPRATROPIUM-ALBUTEROL 0.5-2.5 (3) MG/3ML IN SOLN: 3.0000 mL | Freq: Four times a day (QID) | RESPIRATORY_TRACT | 5 refills | Status: DC

## 2020-04-08 NOTE — Telephone Encounter (Signed)
Called [facility] to try to initiate an appeal for the PA that was denied. Spoke with Santiago Glad who stated med needs to be covered under pt's medicare part b insurance if he does not reside in a nursing facility.  Called pt's pharmacy and spoke with Abby who said they were waiting to hear from pt's daughter as they needed pt's Medicare card to be able to see if pt does have medicare part b. I provided Abby with pt's last 4 of social and they were able to locate a card using that info.  Pt's med did go through and it is going to cost pt $14. Called pt's daughter Isabelle Course and told her this info. She verbalized understanding. Nothing further needed.

## 2020-04-08 NOTE — Telephone Encounter (Signed)
Pt's daughter calling back regarding status of nebulizer medication.  Please advise. [phone]

## 2020-04-16 ENCOUNTER — Ambulatory Visit (INDEPENDENT_AMBULATORY_CARE_PROVIDER_SITE_OTHER): Payer: Medicare Other | Admitting: Pulmonary Disease

## 2020-04-16 ENCOUNTER — Other Ambulatory Visit: Payer: Self-pay

## 2020-04-16 ENCOUNTER — Encounter: Payer: Self-pay | Admitting: Pulmonary Disease

## 2020-04-16 VITALS — BP 110/62 | HR 88 | Temp 98.0°F | Ht 63.0 in | Wt 89.2 lb

## 2020-04-16 DIAGNOSIS — Z23 Encounter for immunization: Secondary | ICD-10-CM

## 2020-04-16 DIAGNOSIS — J9621 Acute and chronic respiratory failure with hypoxia: Secondary | ICD-10-CM | POA: Diagnosis not present

## 2020-04-16 DIAGNOSIS — J431 Panlobular emphysema: Secondary | ICD-10-CM | POA: Diagnosis not present

## 2020-04-16 DIAGNOSIS — J9611 Chronic respiratory failure with hypoxia: Secondary | ICD-10-CM | POA: Diagnosis not present

## 2020-04-16 MED ORDER — IPRATROPIUM-ALBUTEROL 0.5-2.5 (3) MG/3ML IN SOLN
3.0000 mL | Freq: Four times a day (QID) | RESPIRATORY_TRACT | 5 refills | Status: DC
Start: 2020-04-16 — End: 2020-05-28

## 2020-04-16 MED ORDER — OXYCODONE-ACETAMINOPHEN 7.5-325 MG PO TABS
1.0000 | ORAL_TABLET | Freq: Four times a day (QID) | ORAL | 0 refills | Status: DC | PRN
Start: 2020-04-16 — End: 2020-05-28

## 2020-04-16 NOTE — Progress Notes (Signed)
Bobby Caldwell    [id number]    12/20/1955  Primary Care Physician:[name], Krista Blue, MD  Referring Physician: [name], MD [address] [place],  [place] [address]  Chief complaint:   Patient being seen for follow-up for obstructive lung disease Feels generally well   HPI:  Recent travel to [place] for 3 weeks Came back about 2 months ago  Having pain discomfort especially at night  Shortness of breath with exertion Can't seem to be able to do anything without oxygen on  Breathing feels about as good Has been using Breztri, uses nebulizer regularly  Recently hospitalized for respiratory failure   History of stomach cancer Decreased appetite Still has cough with shortness of breath  Has been using oxygen around-the-clock  Has severe COPD/emphysema  Poor activity levels, decreased appetite for which he is on Megace  Started smoking at age 64, greater than 80-pack-year smoking history currently smoking about half a pack per day Was able to quit for a while but currently back smoking actively  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 Budeson-Glycopyrrol-Formoterol (BREZTRI AEROSPHERE) 160-9-4.8 MCG/ACT AERO Inhale 2 puffs into the lungs 2 (two) times daily.  . budesonide-formoterol (SYMBICORT) 160-4.5 MCG/ACT inhaler Inhale 2 puffs into the lungs 2 (two) times daily.  . feeding supplement, ENSURE ENLIVE, (ENSURE ENLIVE) LIQD Take 237 mLs by mouth 2 (two) times daily between meals.  . gabapentin (NEURONTIN) 300 MG/6ML solution Take 6 mLs (300 mg total) by mouth 2 (two) times daily.  Marland Kitchen ipratropium-albuterol (DUONEB) 0.5-2.5 (3) MG/3ML SOLN Inhale 3 mLs into the lungs 4 (four) times daily. Dx:J44.9  . megestrol (MEGACE ES) 625 MG/5ML suspension  Take 5 mLs (625 mg total) by mouth daily.  . mirtazapine (REMERON) 7.5 MG tablet Take 1 tablet (7.5 mg total) by mouth at bedtime.  . Multiple Vitamin (MULTIVITAMIN WITH MINERALS) TABS tablet Take 1 tablet by mouth daily.  . ondansetron (ZOFRAN) 4 MG tablet Take 1 tablet (4 mg total) by mouth every 8 (eight) hours as needed for nausea or vomiting.  Marland Kitchen oxyCODONE-acetaminophen (PERCOCET) 7.5-325 MG tablet Take 1 tablet by mouth every 6 (six) hours as needed for severe pain.  . OXYGEN Inhale 2 L into the lungs continuous.  . Tiotropium Bromide Monohydrate (SPIRIVA RESPIMAT) 2.5 MCG/ACT AERS Inhale 2 puffs into the lungs daily.  . traMADol (ULTRAM) 50 MG tablet Take 1 tablet (50 mg total) by mouth every 12 (twelve) hours as needed.  . [DISCONTINUED] ipratropium-albuterol (DUONEB) 0.5-2.5 (3) MG/3ML SOLN Inhale 3 mLs into the lungs 4 (four) times daily. Dx:J44.9  . [DISCONTINUED] oxyCODONE-acetaminophen (PERCOCET) 7.5-325 MG tablet Take 1 tablet by mouth every 6 (six) hours as needed for severe pain.   No facility-administered encounter medications on file as of 04/16/2020.    Allergies as of 04/16/2020  . (No Known Allergies)    Past Medical History:  Diagnosis Date  . COPD (chronic obstructive pulmonary disease) ([place])   . Dyspnea   . PAD (peripheral artery disease) ([place])   . Stomach cancer ([place])   . Tobacco abuse   . Weight loss     Past Surgical History:  Procedure Laterality Date  . ABDOMINAL AORTOGRAM W/LOWER EXTREMITY N/A 05/03/2017   Procedure: ABDOMINAL AORTOGRAM W/LOWER EXTREMITY;  Surgeon: Conrad Foosland, MD;  Location: [facility];  Service: Cardiovascular;  Laterality: N/A;  . BIOPSY  08/22/2018   Procedure: BIOPSY;  Surgeon: [name], MD;  Location: [facility];  Service: Endoscopy;;  . BIOPSY  07/31/2019   Procedure: BIOPSY;  Surgeon: [name], MD;  [facility];  Service: Endoscopy;;  . ESOPHAGOGASTRODUODENOSCOPY     05/24/17  .  ESOPHAGOGASTRODUODENOSCOPY (EGD) WITH PROPOFOL N/A 05/24/2017   Procedure: ESOPHAGOGASTRODUODENOSCOPY (EGD) WITH PROPOFOL;  Surgeon: [name], MD;  [facility];  Service: Endoscopy;  Laterality: N/A;  . ESOPHAGOGASTRODUODENOSCOPY (EGD) WITH PROPOFOL N/A 08/22/2018   Procedure: ESOPHAGOGASTRODUODENOSCOPY (EGD) WITH PROPOFOL;  Surgeon: [name], MD;  [facility];  Service: Endoscopy;  Laterality: N/A;  . ESOPHAGOGASTRODUODENOSCOPY (EGD) WITH PROPOFOL N/A 11/28/2018   Procedure: ESOPHAGOGASTRODUODENOSCOPY (EGD) WITH PROPOFOL;  Surgeon: [name], MD;  [facility];  Service: Endoscopy;  Laterality: N/A;  . ESOPHAGOGASTRODUODENOSCOPY (EGD) WITH PROPOFOL N/A 07/31/2019   Procedure: ESOPHAGOGASTRODUODENOSCOPY (EGD) WITH PROPOFOL;  Surgeon: [name], MD;  [facility];  Service: Endoscopy;  Laterality: N/A;  . EUS N/A 06/07/2017   Procedure: UPPER ENDOSCOPIC ULTRASOUND (EUS) RADIAL;  Surgeon: [name], MD;  [facility];  Service: Endoscopy;  Laterality: N/A;  . FEMORAL-FEMORAL BYPASS GRAFT Bilateral 05/08/2017   Procedure: LEFT TO RIGHT BYPASS GRAFT FEMORAL-FEMORAL ARTERY;  Surgeon: Conrad [place], MD;  Location: [facility];  Service: Vascular;  Laterality: Bilateral;  . IR FLUORO GUIDE PORT INSERTION RIGHT  07/05/2017  . IR US GUIDE VASC ACCESS RIGHT  07/05/2017  . LAPAROSCOPIC INSERTION GASTROSTOMY TUBE N/A 11/27/2017   Procedure: JEJUNOSTOMY TUBE PLACEMENT;  Surgeon: [name], MD;  Location: [facility];  Service: General;  Laterality: N/A;  . LAPAROSCOPIC PARTIAL GASTRECTOMY N/A 11/27/2017   Procedure: PARTIAL GASTRECTOMY;  Surgeon: [name], MD;  Location: [facility];  Service: General;  Laterality: N/A;  . LAPAROSCOPY N/A 11/27/2017   Procedure: LAPAROSCOPY DIAGNOSTIC;  Surgeon: [name], MD;  Location: [facility];  Service: General;  Laterality: N/A;  . NM MYOVIEW LTD  06/2017   LOW RISK. Small basal inferoseptal  defect thought to be related to artifact (although cannot exclude prior infarct). Normal wall motion i in this area would suggest artifact not infarct. Otherwise no ischemia or infarction.  Marland Kitchen PERIPHERAL VASCULAR INTERVENTION Left 05/03/2017   Procedure: PERIPHERAL VASCULAR INTERVENTION;  Surgeon: Conrad [place], MD;  Location: [facility];  Service: Cardiovascular;  Laterality: Left;  common iliac  . POLYPECTOMY  11/28/2018   Procedure: POLYPECTOMY;  Surgeon: [name], MD;  [facility];  Service: Endoscopy;;  . TRANSTHORACIC ECHOCARDIOGRAM  06/26/2017   Normal LV size and function. EF 60-65%. No wall motion abnormalities. GR 1 DD. Mild LA dilation    Family History  Family history unknown: Yes    Social History   Socioeconomic History  . Marital status: Married    Spouse name: Not on file  . Number of children: 4  . Years of education: Not on file  . Highest education level: Not on file  Occupational History  . Not on file  Tobacco Use  . Smoking status: Current Every Day Smoker    Packs/day: 1.50    Years: 50.00    Pack years: 75.00    Types: Cigarettes    Last attempt to quit: 11/2017    Years since quitting: 2.4  . Smokeless tobacco: Never Used  . Tobacco comment: 8 cigs per day 08/05/19  Vaping Use  . Vaping Use: Never used  Substance and Sexual Activity  . Alcohol use: No    Alcohol/week: 0.0 standard drinks  . Drug use: No  . Sexual activity: Yes  Other Topics Concern  . Not on file  Social History Narrative  . Not on file   Social Determinants of Health   Financial Resource Strain:   . Difficulty of Paying Living Expenses: Not on file  Food Insecurity:   . Worried About Charity fundraiser in the Last Year: Not on file  . Ran Out of Food in the Last Year: Not on file  Transportation Needs:   . Lack of Transportation (Medical): Not on file  . Lack of Transportation (Non-Medical): Not on file  Physical Activity:   . Days of Exercise  per Week: Not on file  . Minutes of Exercise per Session: Not on file  Stress:   . Feeling of Stress : Not on file  Social Connections:   . Frequency of Communication with Friends and Family: Not on file  . Frequency of Social Gatherings with Friends and Family: Not on file  . Attends Religious Services: Not on file  . Active Member of Clubs or Organizations: Not on file  . Attends Archivist Meetings: Not on file  . Marital Status: Not on file  Intimate Partner Violence:   . Fear of Current or Ex-Partner: Not on file  . Emotionally Abused: Not on file  . Physically Abused: Not on file  . Sexually Abused: Not on file    Review of Systems  Constitutional: Positive for appetite change. Negative for fatigue.  HENT: Negative.   Eyes: Negative.   Respiratory: Positive for shortness of breath. Negative for cough, choking and chest tightness.   Cardiovascular: Negative.   Gastrointestinal: Negative.   Endocrine: Negative.   Psychiatric/Behavioral:       Depression    Vitals:   04/16/20 1651  BP: 110/62  Pulse: 88  Temp: 98 F (36.7 C)  SpO2: 92%     Physical Exam Constitutional:      Comments: Underweight  HENT:     Head: Normocephalic and atraumatic.  Eyes:     General:        Right eye: No discharge.        Left eye: No discharge.  Neck:     Thyroid: No thyromegaly.     Trachea: No tracheal deviation.  Cardiovascular:     Rate and Rhythm: Normal rate and regular rhythm.  Pulmonary:     Effort: No respiratory distress.     Breath sounds: No stridor. No wheezing or rales.     Comments: Decreased breath sounds bilaterally Musculoskeletal:     Cervical back: No rigidity or tenderness.    Data Reviewed:  Hospital records reviewed Recent labs reviewed   Assessment:  .  Advanced COPD -Continue respiratory Continue nebulization treatments  .  Severe pain and discomfort keeping him up at night -Percocet 1 p.o. every 6 as needed 7.5/325   .   Active smoker -Continues to smoke actively although says he is cutting down  .  History of gastric cancer -Continues to follow-up with oncology on a regular basis  .  History of depression  .  Anorexia  Plan/Recommendations:  .  Continue current inhaler .  Percocet as needed .  Continue oxygen supplementation .  Continue use of breath story .  Follow-up in 3 months  Sherrilyn Rist MD [facility] 04/16/2020, 5:10 PM  CC: [name], MD

## 2020-04-16 NOTE — Patient Instructions (Addendum)
We will send in a prescription for portable oxygen concentrator [facility]  Prescription for Percocet will be sent into the pharmacy  I will see you in 3 months  Call with significant concerns  Plan

## 2020-04-21 ENCOUNTER — Other Ambulatory Visit: Payer: Self-pay | Admitting: Hematology

## 2020-04-27 ENCOUNTER — Telehealth: Payer: Self-pay | Admitting: Pulmonary Disease

## 2020-04-27 NOTE — Telephone Encounter (Signed)
[place] about his prescription for Percocet 7.5/325  Being used for chronic neuropathic pain

## 2020-04-28 ENCOUNTER — Other Ambulatory Visit: Payer: Self-pay | Admitting: Hematology

## 2020-04-28 MED ORDER — MIRTAZAPINE 7.5 MG PO TABS
7.5000 mg | ORAL_TABLET | Freq: Every day | ORAL | 2 refills | Status: DC
Start: 2020-04-28 — End: 2020-06-23

## 2020-05-26 ENCOUNTER — Telehealth: Payer: Self-pay | Admitting: Pulmonary Disease

## 2020-05-26 NOTE — Telephone Encounter (Signed)
LMTCB x1 for pt's daughter.  

## 2020-05-28 ENCOUNTER — Other Ambulatory Visit: Payer: Self-pay | Admitting: Pulmonary Disease

## 2020-05-28 MED ORDER — IPRATROPIUM-ALBUTEROL 0.5-2.5 (3) MG/3ML IN SOLN: 3.0000 mL | Freq: Four times a day (QID) | RESPIRATORY_TRACT | 5 refills | Status: DC

## 2020-05-28 MED ORDER — OXYCODONE-ACETAMINOPHEN 7.5-325 MG PO TABS
1.0000 | ORAL_TABLET | Freq: Four times a day (QID) | ORAL | 0 refills | Status: DC | PRN
Start: 2020-05-28 — End: 2020-10-22

## 2020-05-28 NOTE — Telephone Encounter (Signed)
Spoke with patient's daughter. She stated that she did not receive a call 2 days ago, despite a message stating a voicemail was left.   She was calling to check on the status of the DuoNebs RX. I advised her that I will send [facility] to the [facility] [address]. She was also calling to check on the status of the Percocet refill. She stated that he is still having back pain from his surgery. He is waking up in the middle of the night in pain. They have tried Tramadol but it is not working.   She also stated that since he is on oxygen 24/7, he is having issues with a dry nose. He has used the [employer] Nasal Gel but is not working.   Pharmacy is [facility].   AO, can you please advise? Thanks!

## 2020-05-28 NOTE — Telephone Encounter (Signed)
Pts daughter  is returning call from office regarding medication management. Please advise    cb# [phone]

## 2020-05-31 ENCOUNTER — Inpatient Hospital Stay: Payer: Medicare Other

## 2020-05-31 ENCOUNTER — Inpatient Hospital Stay: Payer: Medicare Other | Attending: Hematology | Admitting: Hematology

## 2020-05-31 DIAGNOSIS — C169 Malignant neoplasm of stomach, unspecified: Secondary | ICD-10-CM

## 2020-05-31 NOTE — Telephone Encounter (Signed)
LMTCB for the pt's daughter [facility]

## 2020-05-31 NOTE — Telephone Encounter (Signed)
Patient's daughter calling back regarding medication and a number to help cover medication with Medicaid.  Ph:  [phone].  Pt's daughter phone number:  [phone].  Bobby Caldwell.

## 2020-06-01 NOTE — Telephone Encounter (Signed)
Lmtcb for pt's daughter.  

## 2020-06-08 ENCOUNTER — Ambulatory Visit: Payer: Medicare Other | Admitting: Gastroenterology

## 2020-06-08 NOTE — Telephone Encounter (Signed)
Yes, it is okay to separate medications

## 2020-06-08 NOTE — Telephone Encounter (Signed)
Yes. It is okay to separate meds

## 2020-06-08 NOTE — Telephone Encounter (Signed)
lmtcb for daughter.  Will send in meds after speaking to daughter.

## 2020-06-08 NOTE — Telephone Encounter (Signed)
lmtcb for Mati, pt's daughter.   Called [facility] and was advised Duoneb does need a PA. If ran through a DME and the medications were sperated, it may be cheaper for the pt and not need a PA.    Dr. Ander Slade, please advise if ok to separate the Duoneb to Albuterol and Ipratropium neb (in separate vials) and to send to DME. Thanks.

## 2020-06-08 NOTE — Telephone Encounter (Signed)
Matijane daughter states Duoneb needs prior authorization thru insurance company. Pharmacy is [facility] phone number is [phone].

## 2020-06-10 NOTE — Telephone Encounter (Signed)
lmtcb for Mati, pt's daughter.

## 2020-06-15 ENCOUNTER — Encounter: Payer: Self-pay | Admitting: Emergency Medicine

## 2020-06-15 NOTE — Telephone Encounter (Signed)
lmtcb for Bobby Caldwell.   Due to several unsuccessful attempts to reach pt and/or daughter a letter will be sent and the message will be closed per triage protocol.    Triage, please print and mail the letter (letter already generated). Once complete then message can be closed. Thanks.

## 2020-06-16 ENCOUNTER — Telehealth: Payer: Self-pay | Admitting: Hematology

## 2020-06-16 NOTE — Telephone Encounter (Signed)
Called pt daughter per 12/7 sch msg - no answer , left message for pt to call back to reschedule appt

## 2020-06-21 ENCOUNTER — Telehealth: Payer: Self-pay | Admitting: Hematology

## 2020-06-21 NOTE — Telephone Encounter (Signed)
Unsure if letter was mailed as encounter is still open. Will forward back to triage to mail letter to pt's home. Close encounter once complete.

## 2020-06-21 NOTE — Telephone Encounter (Signed)
Scheduled appts per 12/13 incoming call from daughter. Pt's daughter confirmed appt date and time.

## 2020-06-21 NOTE — Telephone Encounter (Signed)
Letter printed and placed in outgoing mail.  Nothing further needed at this time- will close encounter.

## 2020-06-21 NOTE — Progress Notes (Signed)
[place]   [phone] [phone]   Clinic Follow up Note   Patient Care Team: [name], MD as PCP - General (Hematology) [name], MD as Consulting Physician (Pulmonary Disease) [name], MD as Attending Physician (Gastroenterology) [name], MD as Consulting Physician (General Surgery) Laurin Coder, MD as Consulting Physician (Pulmonary Disease)   Date of Service:  06/23/2020   CHIEF COMPLAINT: F/u on gastric adenocarcinoma  SUMMARY OF ONCOLOGIC HISTORY: Oncology History Overview Note  Cancer Staging Gastric cancer [facility]) Staging form: Stomach, AJCC 8th Edition - Clinical stage from 05/24/2017: Stage I (cT2, cN0, cM0) - Signed by [name], MD on 06/19/2017 - Pathologic stage from 11/27/2017: Stage I (ypT1b, pN0, cM0) - Signed by [name], MD on 04/04/2018     Gastric adenocarcinoma ([place])  05/24/2017 Procedure   EGD by Dr. [name] 05/24/17 IMPRESSION Ulcerated mass like region in the distal stomach (4cm across), extensively biopsied. This appears neoplastic. - Abnormal mucosa throughout stomach otherwise (mild-moderate gastritis), also extensively biopsied.   05/24/2017 Initial Biopsy   Diagnosis 05/24/17  1. Stomach, biopsy, mass distal - ADENOCARCINOMA, SEE COMMENT. - CHRONIC ACTIVE GASTRITIS WITH INTESTINAL METAPLASIA. 2. 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 - NO DYSPLASIA OR MALIGNANCY   06/04/2017 Initial Diagnosis   Gastric cancer ([place])   06/07/2017 Procedure   EUS by Dr. [name] on 06/07/17  IMPRESSION:  5cm across, partially circumferential uT2N0 gastric adenocarcinoma laying in the distal stomach 3-4cm proximal to the pylorus.   06/22/2017 Imaging   CT CAP W Contrast 06/22/17  IMPRESSION: 1. The gastric mass is somewhat inconspicuous on CT, but a potential 1.2 cm thick mucosal masses seen along the  posterior gastric border on image 83/2. There several small adjacent right gastric lymph nodes measuring up to 0.7 cm in short axis, but no overtly pathologic adenopathy and no findings of hepatic or peritoneal metastatic disease at this time. 2. Calcified granulomas in both lungs along with several noncalcified nodules stable from August 2016 and likely benign given this long-term stability. 3. Other imaging findings of potential clinical significance: Aortic Atherosclerosis (ICD10-I70.0) and Emphysema (ICD10-J43.9). Airway thickening is present, suggesting bronchitis or reactive airways disease. 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 Disc bulges at L3-4 and L4-5.   07/05/2017 Surgery   PAC placement    07/07/2017 - 09/06/2017 Chemotherapy   Neoadjuvant FOLFOX with with leucovorin every 2 weeks. Plan for 6 cycles but stopped after 4 cycles due to poor tolerance     09/10/2017 Imaging    CT AP W Contrast 09/10/17 IMPRESSION: 1. No acute findings identified within the abdomen or pelvis. 2. Interval development of multiple bilateral pulmonary densities within both lower lobes. These are indeterminate, likely post infectious or inflammatory in etiology. Atypical infection not excluded. Metastatic disease is considered less favored but not entirely excluded and follow-up imaging to ensure resolution is advised. 3. No evidence to suggest recurrent tumor or metastatic disease within the abdomen or pelvis. 4.  Aortic Atherosclerosis (ICD10-I70.0).   11/27/2017 Surgery    Surgery He had partial gastrectomy with Dr. [name] on 11/27/2017.   11/27/2017 Pathology Results   11/27/2017 Surgical Pathology Diagnosis 1. Stomach, resection for tumor, Distal w/ Omentum - ADENOCARCINOMA, MODERATELY DIFFERENTIATED, 3.8 CM - LYMPHOVASCULAR SPACE AND PERINEURAL INVASION - NO CARCINOMA IDENTIFIED IN FOUR LYMPH NODES (0/4) -  SEE ONCOLOGY TABLE AND COMMENT  BELOW 2. Lymph node, biopsy, Portal - NO CARCINOMA IDENTIFIED IN ONE LYMPH NODE (0/1) - SEE COMMENT 3. Lymph node, biopsy, Left Gastric - NO CARCINOMA IDENTIFIED IN TWO LYMPH NODES (0/2) - SEE COMMENT 4. Omentum, resection for tumor - BENIGN FIBROADIPOSE TISSUE - NO CARCINOMA IDENTIFIED    11/27/2017 Cancer Staging   Staging form: Stomach, AJCC 8th Edition - Pathologic stage from 11/27/2017: Stage I (ypT1b, pN0, cM0) - Signed by [name], MD on 04/04/2018   08/14/2018 Imaging   CT CAP 08/14/18 IMPRESSION:  1. No findings of active malignancy. 2. Generally stable pulmonary nodules are identified in the lungs. The only new nodule seen today is a 3 mm right apical nodule on image 32/4 which may merit surveillance. Many of the basilar nodules shown on the prior CT abdomen from 09/10/2017 have resolved. 3. Other imaging findings of potential clinical significance: Aortic Atherosclerosis (ICD10-I70.0). Emphysema (ICD10-J43.9). Lumbar degenerative disc disease.   08/22/2018 Procedure   Upper endoscopy 08/22/18 by Dr Verdie Drown The esophagus was slightly tortuous but there were no strictures/stenosis and the mucosa was normal throughout. The gastro-jejunostomy site was widely patent, Bilroth II anatomy. There was a 1.5-2cm region of the anastomosis that was discretely abnormal, soft but somewhat neoplastic appearing and nodular. I biopsied this region extensively. Diagnosis Stomach, biopsy, GJ anastomosis erythema - LOW GRADE GLANDULAR DYSPLASIA. - GOBLET CELL METAPLASIA. - SEE COMMENT.   11/28/2018 Procedure   Upper Endoscopy 11/28/18 by Dr Verdie Drown - Bilroth II anatomy. The previously noted adenomatous polyp was again located at the [place] anastomosis. It was soft, sessile, about 1.9cm across in greatest dimension. I used mixed cold/cautery snare resection to (apparently) completely remove the polyp. Diagnosis Stomach, polyp(s) - LOW  GRADE GLANDULAR DYSPLASIA. - GOBLET CELL METAPLASIA.   07/31/2019 Procedure   Upper Endoscopy by Dr. [name] 07/31/19  IMPRESSION - Typical Bilroth II anatomy. The GJ anastomosis was slightly inflammed, but not overtly neoplastic appearing. No clear recurrent or residual polyps. The anastomosis was sampled at the sight of most significant appearing inflammation (1-2cm section, see images). - Afferent and efferent limbs were intubated briefly and appeared normal. - Mild candida esophagitis, not sampled. Will start empiric diflucan course (100mg  daily for 10 days) - The examination was otherwise normal. FINAL MICROSCOPIC DIAGNOSIS:   A.  IRREGULAR MUCOSA AT GJ JUNCTION, BIOPSY:  -Anastomosis site with peptic change and low grade dysplasia.    04/01/2020 Imaging   CT CAP IMPRESSION: 1. Redemonstrated postoperative findings of distal gastrectomy and gastric jejunostomy. No evidence of malignant recurrence. 2. There is incidental note of intussusception of the jejunum into the gastric lumen, likely transient and peristaltic in nature. 3. Stable small pulmonary nodules scattered throughout the lungs. Attention on follow-up. 4. Additional scattered, clustered centrilobular and tree-in-bud nodules in the dependent right lower lobe, most consistent with aspiration given the presence of frothy debris in the airways. 5. Severe emphysema and diffuse bilateral bronchial wall thickening. Emphysema (ICD10-J43.9). 6. Coronary artery disease. 7. 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 Aortic Atherosclerosis (ICD10-I70.0).      CURRENT THERAPY:  Surveillance  INTERVAL HISTORY:  Olusegun Gerstenberger is here for a follow up. He presents to the clinic with his wife and [employer] interpretor. He notes lately his recent issue has been his breathing. He notes he has issue  with PAC flush today. He has not seen his  pulmonologist in 4-5 months and has not contacted him about anything further. He notes stomach pain he has lasted 4 days and denies pain today. He notes his BM are normal.  He notes he loses his memory at times and gets agitated easily. He notes he has not been seen by physician in a while and mostly stays home. He is open to have someone visit him at home weekly.   REVIEW OF SYSTEMS:   Constitutional: Denies fevers, chills or abnormal weight loss Eyes: Denies blurriness of vision Ears, nose, mouth, throat, and face: Denies mucositis or sore throat Respiratory: Denies cough or wheezes (+) SOB, COPD (+) on supplemental oxygen canula  Cardiovascular: Denies palpitation, chest discomfort or lower extremity swelling Gastrointestinal:  Denies nausea, heartburn (+) Abdominal pain  Skin: Denies abnormal skin rashes Lymphatics: Denies new lymphadenopathy or easy bruising Neurological:Denies numbness, tingling or new weaknesses (+) Decreased memory Behavioral/Psych: Mood is stable, no new changes  All other systems were reviewed with the patient and are negative.  MEDICAL HISTORY:  Past Medical History:  Diagnosis Date  . COPD (chronic obstructive pulmonary disease) ([place])   . Dyspnea   . PAD (peripheral artery disease) ([place])   . Stomach cancer ([place])   . Tobacco abuse   . Weight loss     SURGICAL HISTORY: Past Surgical History:  Procedure Laterality Date  . ABDOMINAL AORTOGRAM W/LOWER EXTREMITY N/A 05/03/2017   Procedure: ABDOMINAL AORTOGRAM W/LOWER EXTREMITY;  Surgeon: Conrad [place], MD;  Location: [place] [facility];  Service: Cardiovascular;  Laterality: N/A;  . BIOPSY  08/22/2018   Procedure: BIOPSY;  Surgeon: [name], MD;  Location: [facility];  Service: Endoscopy;;  . BIOPSY  07/31/2019   Procedure: BIOPSY;  Surgeon: [name], MD;  Location: [facility];  Service: Endoscopy;;  . ESOPHAGOGASTRODUODENOSCOPY     05/24/17  .  ESOPHAGOGASTRODUODENOSCOPY (EGD) WITH PROPOFOL N/A 05/24/2017   Procedure: ESOPHAGOGASTRODUODENOSCOPY (EGD) WITH PROPOFOL;  Surgeon: [name], MD;  [facility];  Service: Endoscopy;  Laterality: N/A;  . ESOPHAGOGASTRODUODENOSCOPY (EGD) WITH PROPOFOL N/A 08/22/2018   Procedure: ESOPHAGOGASTRODUODENOSCOPY (EGD) WITH PROPOFOL;  Surgeon: [name], MD;  [facility];  Service: Endoscopy;  Laterality: N/A;  . ESOPHAGOGASTRODUODENOSCOPY (EGD) WITH PROPOFOL N/A 11/28/2018   Procedure: ESOPHAGOGASTRODUODENOSCOPY (EGD) WITH PROPOFOL;  Surgeon: [name], MD;  [facility];  Service: Endoscopy;  Laterality: N/A;  . ESOPHAGOGASTRODUODENOSCOPY (EGD) WITH PROPOFOL N/A 07/31/2019   Procedure: ESOPHAGOGASTRODUODENOSCOPY (EGD) WITH PROPOFOL;  Surgeon: [name], MD;  [facility];  Service: Endoscopy;  Laterality: N/A;  . EUS N/A 06/07/2017   Procedure: UPPER ENDOSCOPIC ULTRASOUND (EUS) RADIAL;  Surgeon: [name], MD;  [facility];  Service: Endoscopy;  Laterality: N/A;  . FEMORAL-FEMORAL BYPASS GRAFT Bilateral 05/08/2017   Procedure: LEFT TO RIGHT BYPASS GRAFT FEMORAL-FEMORAL ARTERY;  Surgeon: Conrad [place], MD;  Location: [place];  Service: Vascular;  Laterality: Bilateral;  . IR FLUORO GUIDE PORT INSERTION RIGHT  07/05/2017  . IR US GUIDE VASC ACCESS RIGHT  07/05/2017  . LAPAROSCOPIC INSERTION GASTROSTOMY TUBE N/A 11/27/2017   Procedure: JEJUNOSTOMY TUBE PLACEMENT;  Surgeon: [name], MD;  Location: [place];  Service: General;  Laterality: N/A;  . LAPAROSCOPIC PARTIAL GASTRECTOMY N/A 11/27/2017   Procedure: PARTIAL GASTRECTOMY;  Surgeon: [name], MD;  Location: [place];  Service: General;  Laterality: N/A;  . LAPAROSCOPY N/A 11/27/2017   Procedure: LAPAROSCOPY DIAGNOSTIC;  Surgeon: [name], MD;  Location: [place];  Service: General;  Laterality: N/A;  . NM MYOVIEW LTD  06/2017   LOW RISK. Small basal inferoseptal  defect thought to be related to artifact (although cannot exclude prior infarct). Normal wall motion i in this area would suggest artifact not infarct. Otherwise no ischemia or infarction.  Marland Kitchen PERIPHERAL VASCULAR INTERVENTION Left 05/03/2017   Procedure: PERIPHERAL VASCULAR INTERVENTION;  Surgeon: Conrad [place], MD;  Location: [place] [facility];  Service: Cardiovascular;  Laterality: Left;  common iliac  . POLYPECTOMY  11/28/2018   Procedure: POLYPECTOMY;  Surgeon: [name], MD;  [facility];  Service: Endoscopy;;  . TRANSTHORACIC ECHOCARDIOGRAM  06/26/2017   Normal LV size and function. EF 60-65%. No wall motion abnormalities. GR 1 DD. Mild LA dilation    I have reviewed the social history and family history with the patient and they are unchanged from previous note.  ALLERGIES:  has No Known Allergies.  MEDICATIONS:  Current Outpatient Medications  Medication Sig Dispense Refill  . Budeson-Glycopyrrol-Formoterol (BREZTRI AEROSPHERE) 160-9-4.8 MCG/ACT AERO Inhale 2 puffs into the lungs 2 (two) times daily. 5.9 g 0  . budesonide-formoterol (SYMBICORT) 160-4.5 MCG/ACT inhaler Inhale 2 puffs into the lungs 2 (two) times daily. 6 g 5  . feeding supplement, ENSURE ENLIVE, (ENSURE ENLIVE) LIQD Take 237 mLs by mouth 2 (two) times daily between meals. 14222 mL 0  . gabapentin (NEURONTIN) 300 MG/6ML solution Take 6 mLs (300 mg total) by mouth 2 (two) times daily. 470 mL 2  . ipratropium-albuterol (DUONEB) 0.5-2.5 (3) MG/3ML SOLN Inhale 3 mLs into the lungs 4 (four) times daily. Dx:J44.9 360 mL 5  . megestrol (MEGACE ES) 625 MG/5ML suspension Take 5 mLs (625 mg total) by mouth daily. 150 mL 0  . mirtazapine (REMERON) 7.5 MG tablet Take 1 tablet (7.5 mg total) by mouth at bedtime. 30 tablet 3  . Multiple Vitamin (MULTIVITAMIN WITH MINERALS) TABS tablet Take 1 tablet by mouth daily. 30 tablet 0  . ondansetron (ZOFRAN) 4 MG tablet Take 1 tablet (4 mg total) by mouth every 8 (eight)  hours as needed for nausea or vomiting. 15 tablet 0  . oxyCODONE-acetaminophen (PERCOCET) 7.5-325 MG tablet Take 1 tablet by mouth every 6 (six) hours as needed for severe pain. 30 tablet 0  . OXYGEN Inhale 2 L into the lungs continuous.    . Tiotropium Bromide Monohydrate (SPIRIVA RESPIMAT) 2.5 MCG/ACT AERS Inhale 2 puffs into the lungs daily. 4 g 5  . traMADol (ULTRAM) 50 MG tablet Take 1 tablet (50 mg total) by mouth every 12 (twelve) hours as needed. 30 tablet 0   No current facility-administered medications for this visit.    PHYSICAL EXAMINATION: ECOG PERFORMANCE STATUS: 3 - Symptomatic, >50% confined to bed   Vitals:   06/23/20 1316  BP: 120/88  Pulse: 88  Resp: 17  Temp: 97.8 F (36.6 C)  SpO2: 100%   Filed Weights   06/23/20 1316  Weight: 85 lb 6.4 oz (38.7 kg)    GENERAL:alert, no distress and comfortable SKIN: skin color, texture, turgor are normal, no rashes or significant lesions EYES: normal, Conjunctiva are pink and non-injected, sclera clear  NECK: supple, thyroid normal size, non-tender, without nodularity LYMPH:  no palpable lymphadenopathy in the cervical, axillary  LUNGS: clear to auscultation and percussion with normal breathing effort HEART: regular rate & rhythm and no murmurs and no lower extremity edema ABDOMEN:abdomen soft, non-tender and normal bowel sounds Musculoskeletal:no cyanosis of digits and no clubbing  NEURO: alert & oriented  x 3 with fluent speech, no focal motor/sensory deficits EXAM DONE IN WHEELCHAIR TODAY    LABORATORY DATA:  I have reviewed the data as listed CBC Latest Ref Rng & Units 06/23/2020 03/23/2020 11/18/2019  WBC 4.0 - 10.5 K/uL 6.0 7.8 7.0  Hemoglobin 13.0 - 17.0 g/dL 12.8(L) 12.4(L) 13.7  Hematocrit 39.0 - 52.0 % 39.9 37.9(L) 44.1  Platelets 150 - 400 K/uL 182 207 252     CMP Latest Ref Rng & Units 06/23/2020 03/23/2020 11/18/2019  Glucose 70 - 99 mg/dL 85 80 83  BUN 8 - 23 mg/dL 7(L) 6(L) 5(L)  Creatinine 0.61 -  1.24 mg/dL 0.59(L) 0.50(L) 0.62  Sodium 135 - 145 mmol/L 141 139 134(L)  Potassium 3.5 - 5.1 mmol/L 3.8 3.6 3.9  Chloride 98 - 111 mmol/L 98 98 93(L)  CO2 22 - 32 mmol/L 38(H) 34(H) 33(H)  Calcium 8.9 - 10.3 mg/dL 9.5 8.4(L) 8.4(L)  Total Protein 6.5 - 8.1 g/dL 6.6 6.0(L) -  Total Bilirubin 0.3 - 1.2 mg/dL 0.7 0.5 -  Alkaline Phos 38 - 126 U/L 77 80 -  AST 15 - 41 U/L 16 13(L) -  ALT 0 - 44 U/L 16 11 -      RADIOGRAPHIC STUDIES: I have personally reviewed the radiological images as listed and agreed with the findings in the report. No results found.   ASSESSMENT & PLAN:  Bobby Caldwell is a 64 y.o. male with    1. Gastric Adenocarcinoma, uT2N0M0, stage I, ypT1bN0 -Diagnosed in 05/2017. Treated withneoadjuvantchemo and surgery. Currently on surveillance. -His Upper Endoscopy with Dr [name] from 07/31/19 showsno evidence of recurrence, mild esophagitis, which was treated with fluconazole. -After sudden onset of abdominal pain, weight loss and fatigue in 03/2020, we obtained scan. His CT CAP from 04/01/20 showed NED. -He is clinically doing well from Gastric cancer standpoint. He still has low appetite but weight loss currently stable. He notes only have abdominal pain in the past week which has now resolved. He denies any concerns.  -Labs reviewed, CBC and CMP WNL except Hg 12.8, CO2 38, Cr 0.59. Physical exam benign. Will continue surveillance.  -Given his PAC has not been working adequately and not being able to flush, I discussed PAC removal. He would like to try flush next time before removing it.  -F/u in 4 months. I discussed his main issues are his COPD and he should closely follow his Pulmonologist.   2. Abdominal pain, weight loss, fatigue  -He had abdominal pain  With weight loss and fatigue in 03/2020. His CT CAP from 04/01/20 showed NED.  -He did not tolerate tramadol or mirtazapine before and NP Lacie previously prescribed him megace. He would like to retry appetite stimulate.  I called in Mirtazapine today (06/23/20) -He notes abdominal pain only in the past 4 days he has had stomach pain but today he does not have pain. He notes he continues to have low appetite.  -Weight fluctuated but mostly stable in the past few months.   3. COPD, Smoking Cessation -Has been on supplemental oxygen for 4 years, now currently on continuous supplemental oxygen with nasal canula at 3L -He is being followed by pulmonologist Dr. Ander Slade, I discussed he is to f/u in January and showed make appointment.   4.Depression and or anxiety -Currently on Ativan as needed. Continue and monitor.  5. COVID19 (+) on 06/23/19 (quite symptomatic, but did not require hospitalization) - Has recovered now  6. Memory loss  -Pt notes recent decrease in  memory and increase agitation.  -I recommend he discuss this with PCP for further evaluation or referral. He does not have a PCP for now. I gave him contact info of [facility] [place] so he can call for appointment. He is agreeable.   Plan -I called in Mirtazapine today  -Port flush in 2 and 4 months  -Lab and f/u in 4 months, he declined port removal for now    No problem-specific Assessment & Plan notes found for this encounter.   No orders of the defined types were placed in this encounter.  All questions were answered. The patient knows to call the clinic with any problems, questions or concerns. No barriers to learning was detected. The total time spent in the appointment was 25 minutes.    [name], MD 06/23/2020   I, Joslyn Devon, am acting as scribe for [name], MD.   I have reviewed the above documentation for accuracy and completeness, and I agree with the above.

## 2020-06-23 ENCOUNTER — Inpatient Hospital Stay: Payer: Medicare Other

## 2020-06-23 ENCOUNTER — Telehealth: Payer: Self-pay | Admitting: Hematology

## 2020-06-23 ENCOUNTER — Inpatient Hospital Stay: Payer: Medicare Other | Attending: Hematology | Admitting: Hematology

## 2020-06-23 ENCOUNTER — Encounter: Payer: Self-pay | Admitting: Hematology

## 2020-06-23 ENCOUNTER — Other Ambulatory Visit: Payer: Self-pay

## 2020-06-23 VITALS — BP 120/88 | HR 88 | Temp 97.8°F | Resp 17 | Ht 63.0 in | Wt 85.4 lb

## 2020-06-23 DIAGNOSIS — F32A Depression, unspecified: Secondary | ICD-10-CM | POA: Insufficient documentation

## 2020-06-23 DIAGNOSIS — J449 Chronic obstructive pulmonary disease, unspecified: Secondary | ICD-10-CM | POA: Diagnosis not present

## 2020-06-23 DIAGNOSIS — Z79899 Other long term (current) drug therapy: Secondary | ICD-10-CM | POA: Insufficient documentation

## 2020-06-23 DIAGNOSIS — Z85028 Personal history of other malignant neoplasm of stomach: Secondary | ICD-10-CM | POA: Insufficient documentation

## 2020-06-23 DIAGNOSIS — R109 Unspecified abdominal pain: Secondary | ICD-10-CM | POA: Diagnosis not present

## 2020-06-23 DIAGNOSIS — Z8616 Personal history of COVID-19: Secondary | ICD-10-CM | POA: Diagnosis not present

## 2020-06-23 DIAGNOSIS — R413 Other amnesia: Secondary | ICD-10-CM | POA: Insufficient documentation

## 2020-06-23 DIAGNOSIS — Z95828 Presence of other vascular implants and grafts: Secondary | ICD-10-CM | POA: Diagnosis not present

## 2020-06-23 DIAGNOSIS — C169 Malignant neoplasm of stomach, unspecified: Secondary | ICD-10-CM

## 2020-06-23 DIAGNOSIS — F419 Anxiety disorder, unspecified: Secondary | ICD-10-CM | POA: Diagnosis not present

## 2020-06-23 DIAGNOSIS — I70213 Atherosclerosis of native arteries of extremities with intermittent claudication, bilateral legs: Secondary | ICD-10-CM

## 2020-06-23 DIAGNOSIS — R634 Abnormal weight loss: Secondary | ICD-10-CM | POA: Diagnosis not present

## 2020-06-23 DIAGNOSIS — F17210 Nicotine dependence, cigarettes, uncomplicated: Secondary | ICD-10-CM | POA: Diagnosis not present

## 2020-06-23 DIAGNOSIS — C162 Malignant neoplasm of body of stomach: Secondary | ICD-10-CM

## 2020-06-23 DIAGNOSIS — R5383 Other fatigue: Secondary | ICD-10-CM | POA: Insufficient documentation

## 2020-06-23 LAB — COMPREHENSIVE METABOLIC PANEL
ALT: 16 U/L (ref 0–44)
AST: 16 U/L (ref 15–41)
Albumin: 3.6 g/dL (ref 3.5–5.0)
Alkaline Phosphatase: 77 U/L (ref 38–126)
Anion gap: 5 (ref 5–15)
BUN: 7 mg/dL — ABNORMAL LOW (ref 8–23)
CO2: 38 mmol/L — ABNORMAL HIGH (ref 22–32)
Calcium: 9.5 mg/dL (ref 8.9–10.3)
Chloride: 98 mmol/L (ref 98–111)
Creatinine, Ser: 0.59 mg/dL — ABNORMAL LOW (ref 0.61–1.24)
GFR, Estimated: >60 mL/min (ref >60)
Glucose, Bld: 85 mg/dL (ref 70–99)
Potassium: 3.8 mmol/L (ref 3.5–5.1)
Sodium: 141 mmol/L (ref 135–145)
Total Bilirubin: 0.7 mg/dL (ref 0.3–1.2)
Total Protein: 6.6 g/dL (ref 6.5–8.1)

## 2020-06-23 LAB — CBC WITH DIFFERENTIAL/PLATELET
Abs Immature Granulocytes: 0.01 10*3/uL (ref 0.00–0.07)
Basophils Absolute: 0.1 10*3/uL (ref 0.0–0.1)
Basophils Relative: 1 %
Eosinophils Absolute: 0.1 10*3/uL (ref 0.0–0.5)
Eosinophils Relative: 1 %
HCT: 39.9 % (ref 39.0–52.0)
Hemoglobin: 12.8 g/dL — ABNORMAL LOW (ref 13.0–17.0)
Immature Granulocytes: 0 %
Lymphocytes Relative: 24 %
Lymphs Abs: 1.4 10*3/uL (ref 0.7–4.0)
MCH: 29 pg (ref 26.0–34.0)
MCHC: 32.1 g/dL (ref 30.0–36.0)
MCV: 90.5 fL (ref 80.0–100.0)
Monocytes Absolute: 0.5 10*3/uL (ref 0.1–1.0)
Monocytes Relative: 8 %
Neutro Abs: 4 10*3/uL (ref 1.7–7.7)
Neutrophils Relative %: 66 %
Platelets: 182 10*3/uL (ref 150–400)
RBC: 4.41 MIL/uL (ref 4.22–5.81)
RDW: 13.9 % (ref 11.5–15.5)
WBC: 6 10*3/uL (ref 4.0–10.5)
nRBC: 0 % (ref 0.0–0.2)

## 2020-06-23 MED ORDER — ALTEPLASE 2 MG IJ SOLR
2.0000 mg | Freq: Once | INTRAMUSCULAR | Status: AC | PRN
  Administered 2020-06-23: 13:00:00 2 mg
  Filled 2020-06-23: qty 2

## 2020-06-23 MED ORDER — HEPARIN SOD (PORK) LOCK FLUSH 100 UNIT/ML IV SOLN
500.0000 [IU] | Freq: Once | INTRAVENOUS | Status: DC
  Administered 2020-06-23: 14:00:00 500 [IU]
  Filled 2020-06-23: qty 5

## 2020-06-23 MED ORDER — SODIUM CHLORIDE 0.9% FLUSH
10.0000 mL | Freq: Once | INTRAVENOUS | Status: AC
  Administered 2020-06-23: 13:00:00 10 mL
  Filled 2020-06-23: qty 10

## 2020-06-23 MED ORDER — ALTEPLASE 2 MG IJ SOLR
INTRAMUSCULAR | Status: AC
  Filled 2020-06-23: qty 2

## 2020-06-23 MED ORDER — SODIUM CHLORIDE 0.9% FLUSH
10.0000 mL | Freq: Once | INTRAVENOUS | Status: AC
  Administered 2020-06-23: 10 mL
  Filled 2020-06-23: qty 10

## 2020-06-23 MED ORDER — MIRTAZAPINE 7.5 MG PO TABS
7.5000 mg | ORAL_TABLET | Freq: Every day | ORAL | 3 refills | Status: DC
Start: 2020-06-23 — End: 2020-10-22

## 2020-06-23 NOTE — Telephone Encounter (Signed)
Scheduled appointments per 12/15 los. Spoke to patient who is aware of appointments dates and times.

## 2020-06-28 ENCOUNTER — Telehealth: Payer: Self-pay

## 2020-06-28 NOTE — Telephone Encounter (Signed)
Pharmacy name: [facility] Drug requested: Duoneb CMM?: yes Key: [id number] Covered alternatives: none given Tried and failed: on file Decision: sent to plan. Routing to triage for follow-up.

## 2020-07-01 NOTE — Telephone Encounter (Signed)
Checked CMM and Duoneb was denied on 06/28/2020. A fax should've been sent to office regarding the denial.   Will send to [facility] to check for form and check for reason for denial.

## 2020-07-05 NOTE — Telephone Encounter (Signed)
I have not seen a fax in regards to the denial.  Called pt's pharmacy to see if pt has been able to get medication. Spoke with Mo at [facility] pharmacy who is also the pharmacy Nature conservation officer. Mo stated that they are needing pt's insurance information and that the Rx has been put on hold. Mo also stated that they have spoken to pt's daughter before and have let her know this but they have not received this info. I stated to her that I would try to see if I could reach out to daughter about this info and she verbalized understanding.  Called and spoke with pt's daughter Bobby Caldwell about this and she said she would get with pt about insurance and they would then go to pharmacy.

## 2020-07-19 ENCOUNTER — Other Ambulatory Visit: Payer: Self-pay | Admitting: Acute Care

## 2020-07-20 ENCOUNTER — Ambulatory Visit (INDEPENDENT_AMBULATORY_CARE_PROVIDER_SITE_OTHER): Payer: Medicare Other | Admitting: Adult Health

## 2020-07-20 ENCOUNTER — Encounter: Payer: Self-pay | Admitting: Adult Health

## 2020-07-20 ENCOUNTER — Other Ambulatory Visit: Payer: Self-pay

## 2020-07-20 VITALS — BP 96/60 | HR 89 | Temp 97.2°F | Ht 65.0 in | Wt 90.0 lb

## 2020-07-20 DIAGNOSIS — R531 Weakness: Secondary | ICD-10-CM

## 2020-07-20 DIAGNOSIS — F17210 Nicotine dependence, cigarettes, uncomplicated: Secondary | ICD-10-CM

## 2020-07-20 DIAGNOSIS — J432 Centrilobular emphysema: Secondary | ICD-10-CM

## 2020-07-20 DIAGNOSIS — J9611 Chronic respiratory failure with hypoxia: Secondary | ICD-10-CM | POA: Diagnosis not present

## 2020-07-20 DIAGNOSIS — E440 Moderate protein-calorie malnutrition: Secondary | ICD-10-CM

## 2020-07-20 MED ORDER — IPRATROPIUM-ALBUTEROL 0.5-2.5 (3) MG/3ML IN SOLN: 3.0000 mL | RESPIRATORY_TRACT | 5 refills | Status: DC | PRN

## 2020-07-20 NOTE — Assessment & Plan Note (Signed)
Continue on protein supplements.

## 2020-07-20 NOTE — Progress Notes (Signed)
@Patient  ID: Bobby Caldwell, male    DOB: 03-17-1956, 65 y.o.   MRN: [id number]  Chief Complaint  Patient presents with  . Follow-up  . 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  And he has severe peripheral artery disease  TEST/EVENTS :  CT chest September 2021 postop findings stable of distal gastrectomy no evidence of malignant recurrence.  Stable small scattered pulmonary nodules.  Scattered tree-in-bud nodules in the dependent right lower lobe most consistent with aspiration, severe emphysema  spirometry 11/15/2017 reveals very severe obstructive disease  CT chest abdomen and pelvis 08/14/2018> no findings of active malignancy, stable pulmonary nodules only new nodule 3 mm right apical nodule.  Many of the basilar nodules shown on CT abdomen 09/10/2017 have resolved.   07/20/2020 Follow up : COPD . O2 RF  Patient presents for a 64-month follow-up.  Patient has known severe COPD and emphysema.  He remains on oxygen 2 L with activity and at bedtime.  Patient says he gets short of breath with minimal activity.  He is currently on breast tree twice daily.  And uses DuoNeb 4 times daily.  He does need a refill of his DuoNeb.  Patient says he misunderstood the instructions and had recently been taken Symbicort and Spiriva.  Patient education on duplicate medications and to stop Symbicort and Spiriva.  Patient remains on oxygen 2 L with activity and at bedtime.  Patient says he needs an order for portable tanks as he does not have any currently.  Patient does continue to smoke.  He is cut down from 2 packs a day to 10 cigarettes a day.  Patient is using nicotine patch.  He was congratulated on  improvement in smoking cessation.  We talked about helpful tips and to cut back to 9 cigarettes a day. Patient denies any hemoptysis, chest pain or orthopnea.  Patient says he is working on trying to put on some weight.  Weight is up 5 pounds currently at 90 pounds.     No Known Allergies  Immunization History  Administered Date(s) Administered  . Influenza Split 03/11/2015  . Influenza,inj,Quad PF,6+ Mos 04/04/2018, 04/16/2020  . PFIZER SARS-COV-2 Vaccination 10/23/2019, 11/17/2019    Past Medical History:  Diagnosis Date  . COPD (chronic obstructive pulmonary disease) ([place])   . Dyspnea   . PAD (peripheral artery disease) ([place])   . Stomach cancer ([place])   . Tobacco abuse   . Weight loss     Tobacco History: Social History   Tobacco Use  Smoking Status Current Every Day Smoker  . Packs/day: 1.50  . Years: 50.00  . Pack years: 75.00  . Types: Cigarettes  . Last attempt to quit: 11/2017  . Years since quitting: 2.7  Smokeless Tobacco Never Used  Tobacco Comment   10-12 cigarettes/24 hours;using patch   Ready to quit: Not Answered Counseling given: Not Answered Comment: 10-12 cigarettes/24 hours;using patch   Outpatient Medications Prior to Visit  Medication Sig Dispense Refill  . Budeson-Glycopyrrol-Formoterol (BREZTRI AEROSPHERE) 160-9-4.8 MCG/ACT AERO Inhale 2 puffs into the lungs 2 (two) times daily. 5.9 g 0  . feeding supplement, ENSURE ENLIVE, (ENSURE ENLIVE) LIQD Take 237 mLs by mouth 2 (two) times daily between meals. 14222 mL 0  .  gabapentin (NEURONTIN) 300 MG/6ML solution Take 6 mLs (300 mg total) by mouth 2 (two) times daily. 470 mL 2  . megestrol (MEGACE ES) 625 MG/5ML suspension Take 5 mLs (625 mg total) by mouth daily. 150 mL 0  . mirtazapine (REMERON) 7.5 MG tablet Take 1 tablet (7.5 mg total) by mouth at bedtime. 30 tablet 3  . Multiple Vitamin (MULTIVITAMIN WITH MINERALS) TABS tablet Take 1 tablet by mouth daily. 30 tablet 0  . ondansetron (ZOFRAN) 4  MG tablet Take 1 tablet (4 mg total) by mouth every 8 (eight) hours as needed for nausea or vomiting. 15 tablet 0  . oxyCODONE-acetaminophen (PERCOCET) 7.5-325 MG tablet Take 1 tablet by mouth every 6 (six) hours as needed for severe pain. 30 tablet 0  . OXYGEN Inhale 2 L into the lungs continuous.    . traMADol (ULTRAM) 50 MG tablet Take 1 tablet (50 mg total) by mouth every 12 (twelve) hours as needed. 30 tablet 0  . budesonide-formoterol (SYMBICORT) 160-4.5 MCG/ACT inhaler Inhale 2 puffs into the lungs 2 (two) times daily. 6 g 5  . ipratropium-albuterol (DUONEB) 0.5-2.5 (3) MG/3ML SOLN USE 1 AMPULE IN NEBULIZER 4 TIMES DAILY 360 mL 0  . Tiotropium Bromide Monohydrate (SPIRIVA RESPIMAT) 2.5 MCG/ACT AERS Inhale 2 puffs into the lungs daily. 4 g 5   No facility-administered medications prior to visit.     Review of Systems:   Constitutional:   No  weight loss, night sweats,  Fevers, chills,  +fatigue, or  lassitude.  HEENT:   No headaches,  Difficulty swallowing,  Tooth/dental problems, or  Sore throat,                No sneezing, itching, ear ache, nasal congestion, post nasal drip,   CV:  No chest pain,  Orthopnea, PND, swelling in lower extremities, anasarca, dizziness, palpitations, syncope.   GI chronic nausea low appetite no bloody stools.   Resp:    No chest wall deformity  Skin: no rash or lesions.  GU: no dysuria, change in color of urine, no urgency or frequency.  No flank pain, no hematuria   MS:  No joint pain or swelling.  No decreased range of motion.  No back pain.    Physical Exam  BP 96/60 (BP Location: Left Arm, Patient Position: Sitting, Cuff Size: Normal)   Pulse 89   Temp (!) 97.2 F (36.2 C) (Temporal)   Ht 5\' 5"  (1.651 m)   Wt 90 lb (40.8 kg)   SpO2 97%   BMI 14.98 kg/m   GEN: A/Ox3; pleasant , NAD, thin and cachectic   HEENT:  [place]/AT,  NOSE-clear, THROAT-clear, no lesions, no postnasal drip or exudate noted.   NECK:  Supple w/ fair ROM; no JVD;  normal carotid impulses w/o bruits; no thyromegaly or nodules palpated; no lymphadenopathy.    RESP  Clear  P & A; w/o, wheezes/ rales/ or rhonchi. no accessory muscle use, no dullness to percussion  CARD:  RRR, no m/r/g, no peripheral edema, pulses intact, no cyanosis or clubbing.  GI:   Soft & nt; nml bowel sounds; no organomegaly or masses detected.   Musco: Warm bil, no deformities or joint swelling noted.   Neuro: alert, no focal deficits noted.    Skin: Warm, no lesions or rashes    Lab Results:  CBC   Imaging: No results found.  alteplase (CATHFLO ACTIVASE) injection 2 mg    Date Action Dose Route User   06/23/2020 1245 Given  2 mg Intracatheter Cates, Porsche L, LPN    heparin lock flush 100 unit/mL    Date Action Dose Route User   06/23/2020 1353 Given 500 Units Intracatheter [place], Deliah Goody, [place]    sodium chloride flush (NS) 0.9 % injection 10 mL    Date Action Dose Route User   06/23/2020 1241 Given 10 mL Intracatheter Cates, Porsche L, LPN    sodium chloride flush (NS) 0.9 % injection 10 mL    Date Action Dose Route User   06/23/2020 1354 Given 10 mL Intracatheter [place], Deliah Goody, RN      No flowsheet data found.  No results found for: NITRICOXIDE      Assessment & Plan:   COPD (chronic obstructive pulmonary disease) with emphysema ([place]) Compensated on present regimen . He has high symptom burden with physical deconditioning and severe protein calorie malnutrition Continue on maintenance regimen.  Plan  Patient Instructions  Continue on BREZTRI 2 puffs Twice daily  , rinse after use  Stop Symbicort and Spiriva  Duoneb every 4-6hrs as needed.  Work on cutting back on smoking , you are doing good, Try only 9 cigs daily and we work on new goal on return.  POC order Continue on Oxygen 2l/m  Follow up with Dr. Ander Slade in 4 weeks as planned and As needed   Please contact office for sooner follow up if symptoms do not improve or worsen or seek  emergency care        Chronic hypoxemic respiratory failure ([place]) Continue on oxygen 2 L with activity and at bedtime.  Order for portable oxygen tanks.  Moderate protein-calorie malnutrition ([place]) Continue on protein supplements.  Weakness Activity as tolerated  Cigarette smoker Smoking cessation encouraged     Rexene Edison, NP 07/20/2020

## 2020-07-20 NOTE — Patient Instructions (Addendum)
Continue on BREZTRI 2 puffs Twice daily  , rinse after use  Stop Symbicort and Spiriva  Duoneb every 4-6hrs as needed.  Work on cutting back on smoking , you are doing good, Try only 9 cigs daily and we work on new goal on return.  POC order Continue on Oxygen 2l/m  Follow up with Dr. Ander Slade in 4 weeks as planned and As needed   Please contact office for sooner follow up if symptoms do not improve or worsen or seek emergency care

## 2020-07-20 NOTE — Assessment & Plan Note (Signed)
Smoking cessation encouraged!

## 2020-07-20 NOTE — Assessment & Plan Note (Signed)
Compensated on present regimen . He has high symptom burden with physical deconditioning and severe protein calorie malnutrition Continue on maintenance regimen.  Plan  Patient Instructions  Continue on BREZTRI 2 puffs Twice daily  , rinse after use  Stop Symbicort and Spiriva  Duoneb every 4-6hrs as needed.  Work on cutting back on smoking , you are doing good, Try only 9 cigs daily and we work on new goal on return.  POC order Continue on Oxygen 2l/m  Follow up with Dr. Ander Slade in 4 weeks as planned and As needed   Please contact office for sooner follow up if symptoms do not improve or worsen or seek emergency care

## 2020-07-20 NOTE — Assessment & Plan Note (Signed)
Activity as tolerated

## 2020-07-20 NOTE — Assessment & Plan Note (Signed)
Continue on oxygen 2 L with activity and at bedtime.  Order for portable oxygen tanks.

## 2020-07-21 ENCOUNTER — Other Ambulatory Visit: Payer: Self-pay

## 2020-07-21 MED ORDER — IPRATROPIUM-ALBUTEROL 0.5-2.5 (3) MG/3ML IN SOLN: 3.0000 mL | RESPIRATORY_TRACT | 5 refills | Status: DC | PRN

## 2020-08-05 ENCOUNTER — Telehealth: Payer: Self-pay | Admitting: Adult Health

## 2020-08-05 NOTE — Telephone Encounter (Signed)
ATC patient's daughter but she did not answer and the VM was full.   Medication was sent to pharmacy on 07/21/20.

## 2020-08-05 NOTE — Telephone Encounter (Signed)
Informed daughter that RX was called into pharmacy.

## 2020-08-05 NOTE — Telephone Encounter (Signed)
Rx for pt's ipratropium-albuterol sol was sent to pharmacy for pt after last OV with TP. This was sent to pharmacy for pt 07/21/20.  Attempted to call pt's daughter [name] but unable to reach and unable to leave VM. Will try to call back later.

## 2020-08-11 ENCOUNTER — Ambulatory Visit: Payer: Medicare Other | Admitting: Pulmonary Disease

## 2020-08-12 ENCOUNTER — Ambulatory Visit (INDEPENDENT_AMBULATORY_CARE_PROVIDER_SITE_OTHER): Payer: Medicare Other | Admitting: Pulmonary Disease

## 2020-08-12 ENCOUNTER — Other Ambulatory Visit: Payer: Self-pay

## 2020-08-12 ENCOUNTER — Encounter: Payer: Self-pay | Admitting: Pulmonary Disease

## 2020-08-12 VITALS — BP 88/58 | HR 85 | Temp 97.2°F | Ht 65.0 in | Wt 91.8 lb

## 2020-08-12 DIAGNOSIS — J439 Emphysema, unspecified: Secondary | ICD-10-CM | POA: Diagnosis not present

## 2020-08-12 DIAGNOSIS — J9611 Chronic respiratory failure with hypoxia: Secondary | ICD-10-CM

## 2020-08-12 DIAGNOSIS — E440 Moderate protein-calorie malnutrition: Secondary | ICD-10-CM | POA: Diagnosis not present

## 2020-08-12 MED ORDER — BREZTRI AEROSPHERE 160-9-4.8 MCG/ACT IN AERO: 2.0000 | INHALATION_SPRAY | Freq: Two times a day (BID) | RESPIRATORY_TRACT | 0 refills | Status: DC

## 2020-08-12 NOTE — Addendum Note (Signed)
Addended by: Mathis Bud on: 08/12/2020 11:24 AM   Modules accepted: Orders

## 2020-08-12 NOTE — Progress Notes (Signed)
Bobby Caldwell    [id number]    [birth year]  Primary Care Physician:[name], Krista Blue, MD  Referring Physician: [name], MD [address] [place],  [place] [address]  Chief complaint:   Patient being seen for follow-up for obstructive lung disease Feels generally well   HPI:  Cough with minimal secretions, sometimes yellow is Not feeling acutely ill  Continues to smoke about 8 cigarettes a day  Uses DuoNeb about every 3-4 hours Has not been usingBreztri regularly  He continues to use oxygen supplementation at night  Shortness of breath with exertion   History of stomach cancer Decreased appetite Still has cough with shortness of breath  Has been using oxygen around-the-clock  Has severe COPD/emphysema  Poor activity levels, decreased appetite for which he is on Megace  Started smoking at age 61, greater than 80-pack-year smoking history currently smoking about half a pack per day Was able to quit for a while but currently back smoking actively  He has a diagnosis of adenocarcinoma of the stomach for which he is currently on chemotherapy Partial gastrectomy 11/27/2017-stage I disease Significant history of peripheral arterial disease  Outpatient Encounter Medications as of 08/12/2020  Medication Sig  . Budeson-Glycopyrrol-Formoterol (BREZTRI AEROSPHERE) 160-9-4.8 MCG/ACT AERO Inhale 2 puffs into the lungs 2 (two) times daily.  . feeding supplement, ENSURE ENLIVE, (ENSURE ENLIVE) LIQD Take 237 mLs by mouth 2 (two) times daily between meals.  . gabapentin (NEURONTIN) 300 MG/6ML solution Take 6 mLs (300 mg total) by mouth 2 (two) times daily.  Marland Kitchen ipratropium-albuterol (DUONEB) 0.5-2.5 (3) MG/3ML SOLN Take 3 mLs by nebulization every 4 (four) hours as needed (every 4-6 hours as needed). Every 4-6 hours as needed  . megestrol (MEGACE ES) 625 MG/5ML suspension Take 5 mLs (625 mg total) by mouth daily.  . mirtazapine (REMERON) 7.5 MG tablet Take 1 tablet (7.5 mg total)  by mouth at bedtime.  . Multiple Vitamin (MULTIVITAMIN WITH MINERALS) TABS tablet Take 1 tablet by mouth daily.  . ondansetron (ZOFRAN) 4 MG tablet Take 1 tablet (4 mg total) by mouth every 8 (eight) hours as needed for nausea or vomiting.  . OXYGEN Inhale 2 L into the lungs continuous.  Marland Kitchen oxyCODONE-acetaminophen (PERCOCET) 7.5-325 MG tablet Take 1 tablet by mouth every 6 (six) hours as needed for severe pain. (Patient not taking: Reported on 08/12/2020)  . traMADol (ULTRAM) 50 MG tablet Take 1 tablet (50 mg total) by mouth every 12 (twelve) hours as needed. (Patient not taking: Reported on 08/12/2020)   No facility-administered encounter medications on file as of 08/12/2020.    Allergies as of 08/12/2020  . (No Known Allergies)    Past Medical History:  Diagnosis Date  . COPD (chronic obstructive pulmonary disease) ([place])   . Dyspnea   . PAD (peripheral artery disease) ([place])   . Stomach cancer ([place])   . Tobacco abuse   . Weight loss     Past Surgical History:  Procedure Laterality Date  . ABDOMINAL AORTOGRAM W/LOWER EXTREMITY N/A 05/03/2017   Procedure: ABDOMINAL AORTOGRAM W/LOWER EXTREMITY;  Surgeon: Conrad Lupus, MD;  Location: [facility];  Service: Cardiovascular;  Laterality: N/A;  . BIOPSY  08/22/2018   Procedure: BIOPSY;  Surgeon: [name], MD;  Location: [facility];  Service: Endoscopy;;  . BIOPSY  07/31/2019   Procedure: BIOPSY;  Surgeon: [name], MD;  [facility]: [facility];  Service: Endoscopy;;  . ESOPHAGOGASTRODUODENOSCOPY     05/24/17  .  ESOPHAGOGASTRODUODENOSCOPY (EGD) WITH PROPOFOL N/A 05/24/2017   Procedure: ESOPHAGOGASTRODUODENOSCOPY (EGD) WITH PROPOFOL;  Surgeon: [name], MD;  [facility];  Service: Endoscopy;  Laterality: N/A;  . ESOPHAGOGASTRODUODENOSCOPY (EGD) WITH PROPOFOL N/A 08/22/2018   Procedure: ESOPHAGOGASTRODUODENOSCOPY (EGD) WITH PROPOFOL;  Surgeon: [name], MD;  [facility];  Service:  Endoscopy;  Laterality: N/A;  . ESOPHAGOGASTRODUODENOSCOPY (EGD) WITH PROPOFOL N/A 11/28/2018   Procedure: ESOPHAGOGASTRODUODENOSCOPY (EGD) WITH PROPOFOL;  Surgeon: [name], MD;  [facility];  Service: Endoscopy;  Laterality: N/A;  . ESOPHAGOGASTRODUODENOSCOPY (EGD) WITH PROPOFOL N/A 07/31/2019   Procedure: ESOPHAGOGASTRODUODENOSCOPY (EGD) WITH PROPOFOL;  Surgeon: [name], MD;  [facility];  Service: Endoscopy;  Laterality: N/A;  . EUS N/A 06/07/2017   Procedure: UPPER ENDOSCOPIC ULTRASOUND (EUS) RADIAL;  Surgeon: [name], MD;  [facility];  Service: Endoscopy;  Laterality: N/A;  . FEMORAL-FEMORAL BYPASS GRAFT Bilateral 05/08/2017   Procedure: LEFT TO RIGHT BYPASS GRAFT FEMORAL-FEMORAL ARTERY;  Surgeon: Conrad Superior, MD;  Location: [facility];  Service: Vascular;  Laterality: Bilateral;  . IR FLUORO GUIDE PORT INSERTION RIGHT  07/05/2017  . IR US GUIDE VASC ACCESS RIGHT  07/05/2017  . LAPAROSCOPIC INSERTION GASTROSTOMY TUBE N/A 11/27/2017   Procedure: JEJUNOSTOMY TUBE PLACEMENT;  Surgeon: [name], MD;  Location: [facility];  Service: General;  Laterality: N/A;  . LAPAROSCOPIC PARTIAL GASTRECTOMY N/A 11/27/2017   Procedure: PARTIAL GASTRECTOMY;  Surgeon: [name], MD;  Location: [facility];  Service: General;  Laterality: N/A;  . LAPAROSCOPY N/A 11/27/2017   Procedure: LAPAROSCOPY DIAGNOSTIC;  Surgeon: [name], MD;  Location: [facility];  Service: General;  Laterality: N/A;  . NM MYOVIEW LTD  06/2017   LOW RISK. Small basal inferoseptal defect thought to be related to artifact (although cannot exclude prior infarct). Normal wall motion i in this area would suggest artifact not infarct. Otherwise no ischemia or infarction.  Marland Kitchen PERIPHERAL VASCULAR INTERVENTION Left 05/03/2017   Procedure: PERIPHERAL VASCULAR INTERVENTION;  Surgeon: Conrad , MD;  Location: [facility];  Service: Cardiovascular;  Laterality: Left;  common iliac  .  POLYPECTOMY  11/28/2018   Procedure: POLYPECTOMY;  Surgeon: [name], MD;  [facility];  Service: Endoscopy;;  . TRANSTHORACIC ECHOCARDIOGRAM  06/26/2017   Normal LV size and function. EF 60-65%. No wall motion abnormalities. GR 1 DD. Mild LA dilation    Family History  Family history unknown: Yes    Social History   Socioeconomic History  . Marital status: Married    Spouse name: Not on file  . Number of children: 4  . Years of education: Not on file  . Highest education level: Not on file  Occupational History  . Not on file  Tobacco Use  . Smoking status: Current Every Day Smoker    Packs/day: 1.50    Years: 50.00    Pack years: 75.00    Types: Cigarettes    Last attempt to quit: 11/2017    Years since quitting: 2.7  . Smokeless tobacco: Never Used  . Tobacco comment: 10-12 cigarettes/24 hours;using patch  Vaping Use  . Vaping Use: Never used  Substance and Sexual Activity  . Alcohol use: No    Alcohol/week: 0.0 standard drinks  . Drug use: No  . Sexual activity: Yes  Other Topics Concern  . Not on file  Social History Narrative  . Not on file   Social Determinants of Health   Financial Resource Strain: Not  on file  Food Insecurity: Not on file  Transportation Needs: Not on file  Physical Activity: Not on file  Stress: Not on file  Social Connections: Not on file  Intimate Partner Violence: Not on file    Review of Systems  Constitutional: Positive for appetite change. Negative for fatigue.  HENT: Negative.   Eyes: Negative.   Respiratory: Positive for shortness of breath. Negative for cough, choking and chest tightness.   Cardiovascular: Negative.   Gastrointestinal: Negative.   Endocrine: Negative.   Psychiatric/Behavioral: Negative for sleep disturbance.       Depression    Vitals:   08/12/20 1024  BP: (!) 88/58  Pulse: 85  Temp: (!) 97.2 F (36.2 C)  SpO2: 97%     Physical Exam Constitutional:      Appearance:  Normal appearance.     Comments: Underweight  HENT:     Head: Normocephalic and atraumatic.  Neck:     Thyroid: No thyromegaly.     Trachea: No tracheal deviation.  Cardiovascular:     Rate and Rhythm: Normal rate and regular rhythm.  Pulmonary:     Effort: No respiratory distress.     Breath sounds: No stridor. Rales present. No wheezing.     Comments: Decreased breath sounds bilaterally Musculoskeletal:     Cervical back: No rigidity or tenderness.  Neurological:     Mental Status: He is alert.    Data Reviewed:  Recent records reviewed  Assessment:   .  Advanced chronic obstructive pulmonary disease -Continue Breztri -Continue nebulization treatments  .  An active smoker -Working on cutting down -Promises to cut down to about 6 cigarettes a day the next time he comes in  .  History of gastric cancer -Continues to follow-up with oncology  .  History of depression  .  Anorexia  Plan/Recommendations:  .  Continue inhalers .  Continue oxygen supplementation .  Continue nebulization treatments  .  Smoking cessation counseling  .  Follow-up in 2 months  Sherrilyn Rist MD [facility] 08/12/2020, 10:40 AM  CC: [name], MD

## 2020-08-12 NOTE — Patient Instructions (Signed)
Continue with breztri twice a day-2 puffs twice a day  Continue with DuoNeb nebulization up to 4 times a day  Continue to work on quitting smoking   Follow-up in 2 months

## 2020-08-24 ENCOUNTER — Inpatient Hospital Stay: Payer: Medicare Other

## 2020-08-24 ENCOUNTER — Other Ambulatory Visit: Payer: Self-pay | Admitting: Nurse Practitioner

## 2020-08-24 ENCOUNTER — Other Ambulatory Visit: Payer: Self-pay

## 2020-08-24 ENCOUNTER — Inpatient Hospital Stay: Payer: Medicare Other | Attending: Hematology

## 2020-08-24 DIAGNOSIS — J449 Chronic obstructive pulmonary disease, unspecified: Secondary | ICD-10-CM | POA: Insufficient documentation

## 2020-08-24 DIAGNOSIS — Z85028 Personal history of other malignant neoplasm of stomach: Secondary | ICD-10-CM | POA: Diagnosis present

## 2020-08-24 DIAGNOSIS — R6889 Other general symptoms and signs: Secondary | ICD-10-CM

## 2020-08-24 DIAGNOSIS — R634 Abnormal weight loss: Secondary | ICD-10-CM | POA: Diagnosis not present

## 2020-08-24 DIAGNOSIS — F32A Depression, unspecified: Secondary | ICD-10-CM | POA: Diagnosis not present

## 2020-08-24 DIAGNOSIS — R5383 Other fatigue: Secondary | ICD-10-CM | POA: Diagnosis not present

## 2020-08-24 DIAGNOSIS — Z95828 Presence of other vascular implants and grafts: Secondary | ICD-10-CM

## 2020-08-24 DIAGNOSIS — C162 Malignant neoplasm of body of stomach: Secondary | ICD-10-CM

## 2020-08-24 DIAGNOSIS — Z8616 Personal history of COVID-19: Secondary | ICD-10-CM | POA: Diagnosis not present

## 2020-08-24 DIAGNOSIS — C169 Malignant neoplasm of stomach, unspecified: Secondary | ICD-10-CM

## 2020-08-24 DIAGNOSIS — F419 Anxiety disorder, unspecified: Secondary | ICD-10-CM | POA: Diagnosis not present

## 2020-08-24 DIAGNOSIS — R413 Other amnesia: Secondary | ICD-10-CM | POA: Insufficient documentation

## 2020-08-24 DIAGNOSIS — R109 Unspecified abdominal pain: Secondary | ICD-10-CM | POA: Diagnosis not present

## 2020-08-24 DIAGNOSIS — F17210 Nicotine dependence, cigarettes, uncomplicated: Secondary | ICD-10-CM | POA: Diagnosis not present

## 2020-08-24 DIAGNOSIS — Z79899 Other long term (current) drug therapy: Secondary | ICD-10-CM | POA: Diagnosis not present

## 2020-08-24 LAB — COMPREHENSIVE METABOLIC PANEL
ALT: 15 U/L (ref 0–44)
AST: 16 U/L (ref 15–41)
Albumin: 3.4 g/dL — ABNORMAL LOW (ref 3.5–5.0)
Alkaline Phosphatase: 80 U/L (ref 38–126)
Anion gap: 6 (ref 5–15)
BUN: 10 mg/dL (ref 8–23)
CO2: 35 mmol/L — ABNORMAL HIGH (ref 22–32)
Calcium: 8.7 mg/dL — ABNORMAL LOW (ref 8.9–10.3)
Chloride: 100 mmol/L (ref 98–111)
Creatinine, Ser: 0.53 mg/dL — ABNORMAL LOW (ref 0.61–1.24)
GFR, Estimated: >60 mL/min (ref >60)
Glucose, Bld: 83 mg/dL (ref 70–99)
Potassium: 4.2 mmol/L (ref 3.5–5.1)
Sodium: 141 mmol/L (ref 135–145)
Total Bilirubin: 0.4 mg/dL (ref 0.3–1.2)
Total Protein: 6.3 g/dL — ABNORMAL LOW (ref 6.5–8.1)

## 2020-08-24 LAB — CBC WITH DIFFERENTIAL/PLATELET
Abs Immature Granulocytes: 0.01 10*3/uL (ref 0.00–0.07)
Basophils Absolute: 0.1 10*3/uL (ref 0.0–0.1)
Basophils Relative: 1 %
Eosinophils Absolute: 0.2 10*3/uL (ref 0.0–0.5)
Eosinophils Relative: 3 %
HCT: 37 % — ABNORMAL LOW (ref 39.0–52.0)
Hemoglobin: 11.9 g/dL — ABNORMAL LOW (ref 13.0–17.0)
Immature Granulocytes: 0 %
Lymphocytes Relative: 22 %
Lymphs Abs: 1.5 10*3/uL (ref 0.7–4.0)
MCH: 29.3 pg (ref 26.0–34.0)
MCHC: 32.2 g/dL (ref 30.0–36.0)
MCV: 91.1 fL (ref 80.0–100.0)
Monocytes Absolute: 0.6 10*3/uL (ref 0.1–1.0)
Monocytes Relative: 8 %
Neutro Abs: 4.4 10*3/uL (ref 1.7–7.7)
Neutrophils Relative %: 66 %
Platelets: 172 10*3/uL (ref 150–400)
RBC: 4.06 MIL/uL — ABNORMAL LOW (ref 4.22–5.81)
RDW: 13.4 % (ref 11.5–15.5)
WBC: 6.8 10*3/uL (ref 4.0–10.5)
nRBC: 0 % (ref 0.0–0.2)

## 2020-08-24 MED ORDER — SODIUM CHLORIDE 0.9% FLUSH
10.0000 mL | Freq: Once | INTRAVENOUS | Status: AC
  Administered 2020-08-24: 10 mL
  Filled 2020-08-24: qty 10

## 2020-08-24 MED ORDER — HEPARIN SOD (PORK) LOCK FLUSH 100 UNIT/ML IV SOLN
500.0000 [IU] | Freq: Once | INTRAVENOUS | Status: AC
  Administered 2020-08-24: 500 [IU]
  Filled 2020-08-24: qty 5

## 2020-10-06 ENCOUNTER — Telehealth: Payer: Self-pay | Admitting: Pulmonary Disease

## 2020-10-06 MED ORDER — IPRATROPIUM-ALBUTEROL 0.5-2.5 (3) MG/3ML IN SOLN: 3.0000 mL | RESPIRATORY_TRACT | 5 refills | Status: DC | PRN

## 2020-10-06 NOTE — Telephone Encounter (Signed)
pt daughter is calling to get refill on nebulizer soultion & also requesting recommendation for something for his nose, states that its dry from the o2. states they tried something OTC but it didn't work, also didnt know the name of it. Pt uses [facility] on [address] please advise [phone]

## 2020-10-06 NOTE — Telephone Encounter (Signed)
Spoke with the pt's daughter with his verbal permission bc she is not on DPR  Duoneb refilled per her request She states pt's nose extremely dry, relates to o2 use  Has tried a couple of OTC sprays, unsure name of meds but they did not give relief  She is not sure that o2 has humidity added to it  Do you want me to send DME order to try this? I had rec AYR saline gel in the meantime and she states he already tried this as well  Please advise, thanks!

## 2020-10-07 NOTE — Telephone Encounter (Signed)
A humidifier in his room may be better  Ayr nasal gel is appropriate, use frequently  Humidification can only be added to much higher flow of oxygen which he is not on

## 2020-10-07 NOTE — Telephone Encounter (Signed)
Called and spoke with Patient's Daugter, Imrane.  Dr.Olalere's recommendations given. Understanding stated. Nothing further at this time.

## 2020-10-18 NOTE — Progress Notes (Signed)
[place]   [phone] Fax:([phone]) [phone]   Clinic Follow up Note   Patient Care Team: [name], MD as PCP - General (Hematology) [name], MD as Consulting Physician (Pulmonary Disease) [name], MD as Attending Physician (Gastroenterology) [name], MD as Consulting Physician (General Surgery) Laurin Coder, MD as Consulting Physician (Pulmonary Disease)  Date of Service:  10/22/2020  CHIEF COMPLAINT: F/u on gastric adenocarcinoma  SUMMARY OF ONCOLOGIC HISTORY: Oncology History Overview Note  Cancer Staging Gastric cancer [facility]) Staging form: Stomach, AJCC 8th Edition - Clinical stage from 05/24/2017: Stage I (cT2, cN0, cM0) - Signed by [name], MD on 06/19/2017 - Pathologic stage from 11/27/2017: Stage I (ypT1b, pN0, cM0) - Signed by [name], MD on 04/04/2018     Gastric adenocarcinoma ([place])  05/24/2017 Procedure   EGD by Dr. [name] 05/24/17 IMPRESSION Ulcerated mass like region in the distal stomach (4cm across), extensively biopsied. This appears neoplastic. - Abnormal mucosa throughout stomach otherwise (mild-moderate gastritis), also extensively biopsied.   05/24/2017 Initial Biopsy   Diagnosis 05/24/17  1. Stomach, biopsy, mass distal - ADENOCARCINOMA, SEE COMMENT. - CHRONIC ACTIVE GASTRITIS WITH INTESTINAL METAPLASIA. 2. 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 - NO DYSPLASIA OR MALIGNANCY   06/04/2017 Initial Diagnosis   Gastric cancer ([place])   06/07/2017 Procedure   EUS by Dr. [name] on 06/07/17  IMPRESSION:  5cm across, partially circumferential uT2N0 gastric adenocarcinoma laying in the distal stomach 3-4cm proximal to the pylorus.   06/22/2017 Imaging   CT CAP W Contrast 06/22/17  IMPRESSION: 1. The gastric mass is somewhat inconspicuous on CT, but a potential 1.2 cm thick mucosal masses seen along the posterior  gastric border on image 83/2. There several small adjacent right gastric lymph nodes measuring up to 0.7 cm in short axis, but no overtly pathologic adenopathy and no findings of hepatic or peritoneal metastatic disease at this time. 2. Calcified granulomas in both lungs along with several noncalcified nodules stable from August 2016 and likely benign given this long-term stability. 3. Other imaging findings of potential clinical significance: Aortic Atherosclerosis (ICD10-I70.0) and Emphysema (ICD10-J43.9). Airway thickening is present, suggesting bronchitis or reactive airways disease. 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 Disc bulges at L3-4 and L4-5.   07/05/2017 Surgery   PAC placement    07/07/2017 - 09/06/2017 Chemotherapy   Neoadjuvant FOLFOX with with leucovorin every 2 weeks. Plan for 6 cycles but stopped after 4 cycles due to poor tolerance     09/10/2017 Imaging    CT AP W Contrast 09/10/17 IMPRESSION: 1. No acute findings identified within the abdomen or pelvis. 2. Interval development of multiple bilateral pulmonary densities within both lower lobes. These are indeterminate, likely post infectious or inflammatory in etiology. Atypical infection not excluded. Metastatic disease is considered less favored but not entirely excluded and follow-up imaging to ensure resolution is advised. 3. No evidence to suggest recurrent tumor or metastatic disease within the abdomen or pelvis. 4.  Aortic Atherosclerosis (ICD10-I70.0).   11/27/2017 Surgery    Surgery He had partial gastrectomy with Dr. [name] on 11/27/2017.   11/27/2017 Pathology Results   11/27/2017 Surgical Pathology Diagnosis 1. Stomach, resection for tumor, Distal w/ Omentum - ADENOCARCINOMA, MODERATELY DIFFERENTIATED, 3.8 CM - LYMPHOVASCULAR SPACE AND PERINEURAL INVASION - NO CARCINOMA IDENTIFIED IN FOUR LYMPH NODES (0/4) - SEE  ONCOLOGY TABLE AND COMMENT BELOW 2.  Lymph node, biopsy, Portal - NO CARCINOMA IDENTIFIED IN ONE LYMPH NODE (0/1) - SEE COMMENT 3. Lymph node, biopsy, Left Gastric - NO CARCINOMA IDENTIFIED IN TWO LYMPH NODES (0/2) - SEE COMMENT 4. Omentum, resection for tumor - BENIGN FIBROADIPOSE TISSUE - NO CARCINOMA IDENTIFIED    11/27/2017 Cancer Staging   Staging form: Stomach, AJCC 8th Edition - Pathologic stage from 11/27/2017: Stage I (ypT1b, pN0, cM0) - Signed by [name], MD on 04/04/2018   08/14/2018 Imaging   CT CAP 08/14/18 IMPRESSION:  1. No findings of active malignancy. 2. Generally stable pulmonary nodules are identified in the lungs. The only new nodule seen today is a 3 mm right apical nodule on image 32/4 which may merit surveillance. Many of the basilar nodules shown on the prior CT abdomen from 09/10/2017 have resolved. 3. Other imaging findings of potential clinical significance: Aortic Atherosclerosis (ICD10-I70.0). Emphysema (ICD10-J43.9). Lumbar degenerative disc disease.   08/22/2018 Procedure   Upper endoscopy 08/22/18 by Dr Verdie Drown The esophagus was slightly tortuous but there were no strictures/stenosis and the mucosa was normal throughout. The gastro-jejunostomy site was widely patent, Bilroth II anatomy. There was a 1.5-2cm region of the anastomosis that was discretely abnormal, soft but somewhat neoplastic appearing and nodular. I biopsied this region extensively. Diagnosis Stomach, biopsy, GJ anastomosis erythema - LOW GRADE GLANDULAR DYSPLASIA. - GOBLET CELL METAPLASIA. - SEE COMMENT.   11/28/2018 Procedure   Upper Endoscopy 11/28/18 by Dr Verdie Drown - Bilroth II anatomy. The previously noted adenomatous polyp was again located at the [place] anastomosis. It was soft, sessile, about 1.9cm across in greatest dimension. I used mixed cold/cautery snare resection to (apparently) completely remove the polyp. Diagnosis Stomach, polyp(s) - LOW GRADE  GLANDULAR DYSPLASIA. - GOBLET CELL METAPLASIA.   07/31/2019 Procedure   Upper Endoscopy by Dr. [name] 07/31/19  IMPRESSION - Typical Bilroth II anatomy. The GJ anastomosis was slightly inflammed, but not overtly neoplastic appearing. No clear recurrent or residual polyps. The anastomosis was sampled at the sight of most significant appearing inflammation (1-2cm section, see images). - Afferent and efferent limbs were intubated briefly and appeared normal. - Mild candida esophagitis, not sampled. Will start empiric diflucan course (100mg  daily for 10 days) - The examination was otherwise normal. FINAL MICROSCOPIC DIAGNOSIS:   A.  IRREGULAR MUCOSA AT GJ JUNCTION, BIOPSY:  -Anastomosis site with peptic change and low grade dysplasia.    04/01/2020 Imaging   CT CAP IMPRESSION: 1. Redemonstrated postoperative findings of distal gastrectomy and gastric jejunostomy. No evidence of malignant recurrence. 2. There is incidental note of intussusception of the jejunum into the gastric lumen, likely transient and peristaltic in nature. 3. Stable small pulmonary nodules scattered throughout the lungs. Attention on follow-up. 4. Additional scattered, clustered centrilobular and tree-in-bud nodules in the dependent right lower lobe, most consistent with aspiration given the presence of frothy debris in the airways. 5. Severe emphysema and diffuse bilateral bronchial wall thickening. Emphysema (ICD10-J43.9). 6. Coronary artery disease. 7. 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 Aortic Atherosclerosis (ICD10-I70.0).      CURRENT THERAPY:  Surveillance  INTERVAL HISTORY:  Bobby Caldwell is here for a follow up. He was last seen by me 4 months ago. He presents to the clinic with his daughter. He notes worsened breathing and now required 4L supplemental oxygen. He has not been  seen by pulmonologist recently. He also uses nebulizer  machine more often lately. He notes having acid reflux and cough with mucus. This has increased lately. He denies fever lately. He notes he is still smoking, but he will do this outside without his oxygen. He has reduced to 12 cigarettes a day. He has been trying for quite some time to quit completely. He noes his stomach is not bad. I reviewed his medication list with him. He notes mild bleeding at [facility] site. He agrees this is related to thinner skin being irritation by PAC.     REVIEW OF SYSTEMS:   Constitutional: Denies fevers, chills or abnormal weight loss Eyes: Denies blurriness of vision Ears, nose, mouth, throat, and face: Denies mucositis or sore throat  Respiratory: (+) On supplemental oxygen canula, 4L (+) cough with yellow phlegm Cardiovascular: Denies palpitation, chest discomfort or lower extremity swelling Gastrointestinal:  Denies nausea, heartburn or change in bowel habits Skin: Denies abnormal skin rashes Lymphatics: Denies new lymphadenopathy or easy bruising Neurological:Denies numbness, tingling or new weaknesses Behavioral/Psych: Mood is stable, no new changes  All other systems were reviewed with the patient and are negative.  MEDICAL HISTORY:  Past Medical History:  Diagnosis Date  . COPD (chronic obstructive pulmonary disease) ([place])   . Dyspnea   . PAD (peripheral artery disease) ([place])   . Stomach cancer ([place])   . Tobacco abuse   . Weight loss     SURGICAL HISTORY: Past Surgical History:  Procedure Laterality Date  . ABDOMINAL AORTOGRAM W/LOWER EXTREMITY N/A 05/03/2017   Procedure: ABDOMINAL AORTOGRAM W/LOWER EXTREMITY;  Surgeon: Conrad Hallowell, MD;  Location: [place] [facility];  Service: Cardiovascular;  Laterality: N/A;  . BIOPSY  08/22/2018   Procedure: BIOPSY;  Surgeon: [name], MD;  Location: [facility];  Service: Endoscopy;;  . BIOPSY  07/31/2019   Procedure: BIOPSY;  Surgeon: [name], MD;  Location: [facility];  Service: Endoscopy;;  . ESOPHAGOGASTRODUODENOSCOPY     05/24/17  . ESOPHAGOGASTRODUODENOSCOPY (EGD) WITH PROPOFOL N/A 05/24/2017   Procedure: ESOPHAGOGASTRODUODENOSCOPY (EGD) WITH PROPOFOL;  Surgeon: [name], MD;  [facility];  Service: Endoscopy;  Laterality: N/A;  . ESOPHAGOGASTRODUODENOSCOPY (EGD) WITH PROPOFOL N/A 08/22/2018   Procedure: ESOPHAGOGASTRODUODENOSCOPY (EGD) WITH PROPOFOL;  Surgeon: [name], MD;  [facility];  Service: Endoscopy;  Laterality: N/A;  . ESOPHAGOGASTRODUODENOSCOPY (EGD) WITH PROPOFOL N/A 11/28/2018   Procedure: ESOPHAGOGASTRODUODENOSCOPY (EGD) WITH PROPOFOL;  Surgeon: [name], MD;  [facility];  Service: Endoscopy;  Laterality: N/A;  . ESOPHAGOGASTRODUODENOSCOPY (EGD) WITH PROPOFOL N/A 07/31/2019   Procedure: ESOPHAGOGASTRODUODENOSCOPY (EGD) WITH PROPOFOL;  Surgeon: [name], MD;  [facility];  Service: Endoscopy;  Laterality: N/A;  . EUS N/A 06/07/2017   Procedure: UPPER ENDOSCOPIC ULTRASOUND (EUS) RADIAL;  Surgeon: [name], MD;  [facility];  Service: Endoscopy;  Laterality: N/A;  . FEMORAL-FEMORAL BYPASS GRAFT Bilateral 05/08/2017   Procedure: LEFT TO RIGHT BYPASS GRAFT FEMORAL-FEMORAL ARTERY;  Surgeon: Conrad Cloverly, MD;  Location: [place];  Service: Vascular;  Laterality: Bilateral;  . IR FLUORO GUIDE PORT INSERTION RIGHT  07/05/2017  . IR US GUIDE VASC ACCESS RIGHT  07/05/2017  . LAPAROSCOPIC INSERTION GASTROSTOMY TUBE N/A 11/27/2017   Procedure: JEJUNOSTOMY TUBE PLACEMENT;  Surgeon: [name], MD;  Location: [place];  Service: General;  Laterality: N/A;  . LAPAROSCOPIC PARTIAL GASTRECTOMY N/A 11/27/2017   Procedure: PARTIAL GASTRECTOMY;  Surgeon: [name], MD;  Location: [place];  Service: General;  Laterality: N/A;  . LAPAROSCOPY  N/A 11/27/2017   Procedure: LAPAROSCOPY DIAGNOSTIC;  Surgeon: [name], MD;  Location: [place];  Service: General;  Laterality: N/A;  . NM MYOVIEW LTD  06/2017   LOW RISK. Small basal inferoseptal defect thought to be related to artifact (although cannot exclude prior infarct). Normal wall motion i in this area would suggest artifact not infarct. Otherwise no ischemia or infarction.  Marland Kitchen PERIPHERAL VASCULAR INTERVENTION Left 05/03/2017   Procedure: PERIPHERAL VASCULAR INTERVENTION;  Surgeon: Conrad Tama, MD;  Location: [place] [facility];  Service: Cardiovascular;  Laterality: Left;  common iliac  . POLYPECTOMY  11/28/2018   Procedure: POLYPECTOMY;  Surgeon: [name], MD;  [facility];  Service: Endoscopy;;  . TRANSTHORACIC ECHOCARDIOGRAM  06/26/2017   Normal LV size and function. EF 60-65%. No wall motion abnormalities. GR 1 DD. Mild LA dilation    I have reviewed the social history and family history with the patient and they are unchanged from previous note.  ALLERGIES:  has No Known Allergies.  MEDICATIONS:  Current Outpatient Medications  Medication Sig Dispense Refill  . Budeson-Glycopyrrol-Formoterol (BREZTRI AEROSPHERE) 160-9-4.8 MCG/ACT AERO Inhale 2 puffs into the lungs 2 (two) times daily. 5.9 g 0  . feeding supplement, ENSURE ENLIVE, (ENSURE ENLIVE) LIQD Take 237 mLs by mouth 2 (two) times daily between meals. 14222 mL 0  . ipratropium-albuterol (DUONEB) 0.5-2.5 (3) MG/3ML SOLN Take 3 mLs by nebulization every 4 (four) hours as needed. Dx J44.9 360 mL 5  . mirtazapine (REMERON) 7.5 MG tablet Take 1 tablet (7.5 mg total) by mouth at bedtime. 30 tablet 5  . Multiple Vitamin (MULTIVITAMIN WITH MINERALS) TABS tablet Take 1 tablet by mouth daily. 30 tablet 0  . ondansetron (ZOFRAN) 4 MG tablet Take 1 tablet (4 mg total) by mouth every 8 (eight) hours as needed for nausea or vomiting. 15 tablet 0  . OXYGEN Inhale 2 L into the lungs continuous.     No current facility-administered medications for this visit.    PHYSICAL EXAMINATION: ECOG PERFORMANCE  STATUS: 2 - Symptomatic, <50% confined to bed  Vitals:   10/22/20 1031  BP: 103/71  Pulse: 90  Resp: 18  Temp: (!) 97.1 F (36.2 C)  SpO2: 93%   Filed Weights   10/22/20 1031  Weight: 96 lb 11.2 oz (43.9 kg)    GENERAL:alert, no distress and comfortable SKIN: skin color, texture, turgor are normal, no rashes or significant lesions EYES: normal, Conjunctiva are pink and non-injected, sclera clear  NECK: supple, thyroid normal size, non-tender, without nodularity LYMPH:  no palpable lymphadenopathy in the cervical, axillary  LUNGS: clear to auscultation and percussion with normal breathing effort HEART: regular rate & rhythm and no murmurs and no lower extremity edema (+) Decreased breath sounds on b/l lungs  ABDOMEN:abdomen soft, non-tender and normal bowel sounds (+) Midline surgical incision healed well  Musculoskeletal:no cyanosis of digits and no clubbing  NEURO: alert & oriented x 3 with fluent speech, no focal motor/sensory deficits PHYSICAL EXAM IN WHEELCHAIR TODAY   LABORATORY DATA:  I have reviewed the data as listed CBC Latest Ref Rng & Units 10/22/2020 08/24/2020 06/23/2020  WBC 4.0 - 10.5 K/uL 6.2 6.8 6.0  Hemoglobin 13.0 - 17.0 g/dL 12.2(L) 11.9(L) 12.8(L)  Hematocrit 39.0 - 52.0 % 38.4(L) 37.0(L) 39.9  Platelets 150 - 400 K/uL 168 172 182     CMP Latest Ref Rng & Units 08/24/2020 06/23/2020 03/23/2020  Glucose 70 - 99 mg/dL 83 85 80  BUN 8 -  23 mg/dL 10 7(L) 6(L)  Creatinine 0.61 - 1.24 mg/dL 0.53(L) 0.59(L) 0.50(L)  Sodium 135 - 145 mmol/L 141 141 139  Potassium 3.5 - 5.1 mmol/L 4.2 3.8 3.6  Chloride 98 - 111 mmol/L 100 98 98  CO2 22 - 32 mmol/L 35(H) 38(H) 34(H)  Calcium 8.9 - 10.3 mg/dL 8.7(L) 9.5 8.4(L)  Total Protein 6.5 - 8.1 g/dL 6.3(L) 6.6 6.0(L)  Total Bilirubin 0.3 - 1.2 mg/dL 0.4 0.7 0.5  Alkaline Phos 38 - 126 U/L 80 77 80  AST 15 - 41 U/L 16 16 13(L)  ALT 0 - 44 U/L 15 16 11       RADIOGRAPHIC STUDIES: I have personally reviewed the  radiological images as listed and agreed with the findings in the report. No results found.   ASSESSMENT & PLAN:  Akeen Ledyard is a 65 y.o. male with    1. Gastric Adenocarcinoma, uT2N0M0, stage I, ypT1bN0 -Diagnosed in 05/2017. Treated withneoadjuvantchemo and surgery. Currently on surveillance. -His Upper Endoscopy with Dr [name] from 07/31/19 showsno evidence of recurrence, mild esophagitis, which was treated with fluconazole. -After sudden onset of abdominal pain, weight loss and fatigue in 03/2020, we obtained scan. His CT CAP from 04/01/20 showed NED. -He is clinically doing well from Gastric cancer standpoint. Weight is stable lately. Labs reviewed, CBC and CMP WNL except. Physical exam stable with decrease breath sounds.  -He is 3.5 years from his cancer diagnosis. His risk of recurrence has significantly decreased. Continue surveillance. Will proceed with last surveillance scan in 5 months.  -Will continue port flush every 6-8 weeks. Will discuss PAC removal after next scan.  -F/u in 3 months. In the meantime I recommend he f/u with pulmonologist to manage his COPD.    2. Abdominal pain, weight loss, fatigue  -He had abdominal pain  With weight loss and fatigue in 03/2020. His CT CAP from 04/01/20 showed NED.  -He did not tolerate prior tramadol. Abdominal pain has resolved currently.  -He has been able to gain and maintain weight lately. He is no longer using Megace. He can continue Mirtazapine. I refilled today (10/22/20).   3. COPD, Smoking Cessation -Has been on supplemental oxygen for 4 years, now currently on continuous supplemental oxygen with nasal canula at 3-4L. He also uses nebulizer more often.  -He does have b/l decreased breath sounds on exam today (10/22/20). I recommend he f/u with pulmonologist Dr.Olalere for treatment.   4.Depression and or anxiety -Currently on Ativan as needed. Continue and monitor.  5. COVID19 (+) on 06/23/19 (quite symptomatic, but did  not require hospitalization) - Has recovered now  6. Memory loss  -Pt previously noted decrease in memory and increase agitation.  -I recommend he discuss this with PCP for further evaluation or referral. I previously gave him information to contact [place].   Plan -I refilled Mirtazapine today  -Port flush in 6 weeks  -Lab, flush, f/u in 3 months. Will order final scan at that time.  -Copy note to Pulmonologist    No problem-specific Assessment & Plan notes found for this encounter.   No orders of the defined types were placed in this encounter.  All questions were answered. The patient knows to call the clinic with any problems, questions or concerns. No barriers to learning was detected. The total time spent in the appointment was 30 minutes.     [name], MD 10/22/2020   I, Joslyn Devon, am acting as scribe for [name], MD.   I have  reviewed the above documentation for accuracy and completeness, and I agree with the above.    Addendum Lab results reviewed, ferritin 10, B12 level 185, which is on the low end of normal limits.  His iron deficiency and B12 deficiency are likely related to partial gastrectomy, will set up IV iron Venofer and B12 injection in next few week, start oral iron and B12 1000 mcg daily, repeat lab in 6 weeks and 3 months to see if he needs regular B12 injections.  [name]  10/25/2020

## 2020-10-22 ENCOUNTER — Inpatient Hospital Stay (HOSPITAL_BASED_OUTPATIENT_CLINIC_OR_DEPARTMENT_OTHER): Payer: Medicare Other | Admitting: Hematology

## 2020-10-22 ENCOUNTER — Telehealth: Payer: Self-pay | Admitting: Hematology

## 2020-10-22 ENCOUNTER — Other Ambulatory Visit: Payer: Medicare Other

## 2020-10-22 ENCOUNTER — Other Ambulatory Visit: Payer: Self-pay

## 2020-10-22 ENCOUNTER — Inpatient Hospital Stay: Payer: Medicare Other | Attending: Hematology

## 2020-10-22 VITALS — BP 103/71 | HR 90 | Temp 97.1°F | Resp 18 | Ht 65.0 in | Wt 96.7 lb

## 2020-10-22 DIAGNOSIS — C169 Malignant neoplasm of stomach, unspecified: Secondary | ICD-10-CM | POA: Diagnosis not present

## 2020-10-22 DIAGNOSIS — R634 Abnormal weight loss: Secondary | ICD-10-CM | POA: Insufficient documentation

## 2020-10-22 DIAGNOSIS — R5383 Other fatigue: Secondary | ICD-10-CM | POA: Insufficient documentation

## 2020-10-22 DIAGNOSIS — R413 Other amnesia: Secondary | ICD-10-CM | POA: Insufficient documentation

## 2020-10-22 DIAGNOSIS — F419 Anxiety disorder, unspecified: Secondary | ICD-10-CM | POA: Diagnosis not present

## 2020-10-22 DIAGNOSIS — R109 Unspecified abdominal pain: Secondary | ICD-10-CM | POA: Insufficient documentation

## 2020-10-22 DIAGNOSIS — Z79899 Other long term (current) drug therapy: Secondary | ICD-10-CM | POA: Insufficient documentation

## 2020-10-22 DIAGNOSIS — F17210 Nicotine dependence, cigarettes, uncomplicated: Secondary | ICD-10-CM | POA: Insufficient documentation

## 2020-10-22 DIAGNOSIS — Z85028 Personal history of other malignant neoplasm of stomach: Secondary | ICD-10-CM | POA: Insufficient documentation

## 2020-10-22 DIAGNOSIS — E538 Deficiency of other specified B group vitamins: Secondary | ICD-10-CM | POA: Diagnosis not present

## 2020-10-22 DIAGNOSIS — Z8616 Personal history of COVID-19: Secondary | ICD-10-CM | POA: Insufficient documentation

## 2020-10-22 DIAGNOSIS — Z9981 Dependence on supplemental oxygen: Secondary | ICD-10-CM | POA: Diagnosis not present

## 2020-10-22 DIAGNOSIS — D509 Iron deficiency anemia, unspecified: Secondary | ICD-10-CM | POA: Diagnosis not present

## 2020-10-22 DIAGNOSIS — C162 Malignant neoplasm of body of stomach: Secondary | ICD-10-CM

## 2020-10-22 DIAGNOSIS — J449 Chronic obstructive pulmonary disease, unspecified: Secondary | ICD-10-CM | POA: Diagnosis not present

## 2020-10-22 DIAGNOSIS — K2100 Gastro-esophageal reflux disease with esophagitis, without bleeding: Secondary | ICD-10-CM | POA: Insufficient documentation

## 2020-10-22 DIAGNOSIS — R0989 Other specified symptoms and signs involving the circulatory and respiratory systems: Secondary | ICD-10-CM | POA: Insufficient documentation

## 2020-10-22 DIAGNOSIS — R6889 Other general symptoms and signs: Secondary | ICD-10-CM

## 2020-10-22 DIAGNOSIS — F32A Depression, unspecified: Secondary | ICD-10-CM | POA: Diagnosis not present

## 2020-10-22 LAB — COMPREHENSIVE METABOLIC PANEL
ALT: 14 U/L (ref 0–44)
AST: 18 U/L (ref 15–41)
Albumin: 3.8 g/dL (ref 3.5–5.0)
Alkaline Phosphatase: 86 U/L (ref 38–126)
Anion gap: 8 (ref 5–15)
BUN: 11 mg/dL (ref 8–23)
CO2: 36 mmol/L — ABNORMAL HIGH (ref 22–32)
Calcium: 8.7 mg/dL — ABNORMAL LOW (ref 8.9–10.3)
Chloride: 97 mmol/L — ABNORMAL LOW (ref 98–111)
Creatinine, Ser: 0.54 mg/dL — ABNORMAL LOW (ref 0.61–1.24)
GFR, Estimated: >60 mL/min (ref >60)
Glucose, Bld: 91 mg/dL (ref 70–99)
Potassium: 4.1 mmol/L (ref 3.5–5.1)
Sodium: 141 mmol/L (ref 135–145)
Total Bilirubin: 0.4 mg/dL (ref 0.3–1.2)
Total Protein: 6.4 g/dL — ABNORMAL LOW (ref 6.5–8.1)

## 2020-10-22 LAB — IRON AND TIBC
Iron: 73 ug/dL (ref 42–163)
Saturation Ratios: 19 % — ABNORMAL LOW (ref 20–55)
TIBC: 385 ug/dL (ref 202–409)
UIBC: 312 ug/dL (ref 117–376)

## 2020-10-22 LAB — CBC WITH DIFFERENTIAL/PLATELET
Abs Immature Granulocytes: 0 10*3/uL (ref 0.00–0.07)
Basophils Absolute: 0.1 10*3/uL (ref 0.0–0.1)
Basophils Relative: 1 %
Eosinophils Absolute: 0.1 10*3/uL (ref 0.0–0.5)
Eosinophils Relative: 1 %
HCT: 38.4 % — ABNORMAL LOW (ref 39.0–52.0)
Hemoglobin: 12.2 g/dL — ABNORMAL LOW (ref 13.0–17.0)
Immature Granulocytes: 0 %
Lymphocytes Relative: 29 %
Lymphs Abs: 1.8 10*3/uL (ref 0.7–4.0)
MCH: 28.8 pg (ref 26.0–34.0)
MCHC: 31.8 g/dL (ref 30.0–36.0)
MCV: 90.8 fL (ref 80.0–100.0)
Monocytes Absolute: 0.5 10*3/uL (ref 0.1–1.0)
Monocytes Relative: 9 %
Neutro Abs: 3.8 10*3/uL (ref 1.7–7.7)
Neutrophils Relative %: 60 %
Platelets: 168 10*3/uL (ref 150–400)
RBC: 4.23 MIL/uL (ref 4.22–5.81)
RDW: 12.8 % (ref 11.5–15.5)
WBC: 6.2 10*3/uL (ref 4.0–10.5)
nRBC: 0 % (ref 0.0–0.2)

## 2020-10-22 LAB — FOLATE: Folate: 21.3 ng/mL (ref >5.9)

## 2020-10-22 LAB — FERRITIN: Ferritin: 10 ng/mL — ABNORMAL LOW (ref 24–336)

## 2020-10-22 LAB — VITAMIN B12: Vitamin B-12: 185 pg/mL (ref 180–914)

## 2020-10-22 MED ORDER — MIRTAZAPINE 7.5 MG PO TABS: 7.5000 mg | ORAL_TABLET | Freq: Every day | ORAL | 5 refills | Status: DC

## 2020-10-22 NOTE — Telephone Encounter (Signed)
Scheduled follow-up appointments per 4/15 los. Patient is aware.

## 2020-10-25 ENCOUNTER — Telehealth: Payer: Self-pay | Admitting: Hematology

## 2020-10-25 ENCOUNTER — Other Ambulatory Visit: Payer: Self-pay | Admitting: Hematology

## 2020-10-25 ENCOUNTER — Other Ambulatory Visit: Payer: Self-pay

## 2020-10-25 ENCOUNTER — Encounter: Payer: Self-pay | Admitting: Hematology

## 2020-10-25 ENCOUNTER — Telehealth: Payer: Self-pay

## 2020-10-25 DIAGNOSIS — D508 Other iron deficiency anemias: Secondary | ICD-10-CM

## 2020-10-25 DIAGNOSIS — E538 Deficiency of other specified B group vitamins: Secondary | ICD-10-CM

## 2020-10-25 DIAGNOSIS — Z85028 Personal history of other malignant neoplasm of stomach: Secondary | ICD-10-CM

## 2020-10-25 NOTE — Telephone Encounter (Signed)
The pt has been scheduled for 12/09/20 at 830 am at [facility] with Dr [name].  COVID test on 5/31 at 107 am   Spoke with the pt's daughter due to language barrier and discussed appt and instructions.  Instructions will also be mailed to the pt.

## 2020-10-25 NOTE — Telephone Encounter (Signed)
-----   Message from [name], MD sent at 10/25/2020  7:34 AM EDT ----- Thanks.  Looks like we tried to reach him twice this past summer and fall to schedule repeat EGD which I had hoped to do at 22-month interval.  We will reach out to him again.     Courtnei Ruddell, Can you try again to reach him?  He needs my first available EGD at [facility] for personal history of gastric cancer.  Thanks    ----- Message ----- From: Alla Feeling, NP Sent: 10/24/2020   9:32 PM EDT To: Jonnie Finner, RN, [name], MD, #  My nurse,  Please let him know his labs, he has mild anemia with iron deficiency ferritin 10, since anemia is not bad he could probably do oral iron BID, but if he is symptomatic I would recommend IV iron for faster replacement.   He has not had iron in the past, Seth Bake which formulation does his insurance approve?   Also B12 is borderline low 182, and he would probably benefit from monthly B12 injections. He has multiple other co-morbidities.   Dr. Burr Medico saw him last week he was doing well clinically. But looking back on his last EGD 07/2019 Dr. [name] was concerned he had pre-cancerous changes, and is likely overdue for endoscopy.   Not sure if we need to discuss him in tumor board or not, just don't want him falling through the cracks.   Thanks, Regan Rakers

## 2020-10-25 NOTE — Telephone Encounter (Signed)
Marylou Mccoy, MD; Alla Feeling, NP; [facility]; [name], MD; Jonnie Finner, RN; 1 other Patient has Medicare and medicaid--both do not have a preferred iron.    Thank you!        Previous Messages   ----- Message -----  From: [name], MD  Sent: 10/25/2020  7:35 AM EDT  To: Jonnie Finner, RN, Timothy Lasso, RN, *   Thanks. Looks like we tried to reach him twice this past summer and fall to schedule repeat EGD which I had hoped to do at 65-month interval. We will reach out to him again.     Weslie Pretlow,  Can you try again to reach him? He needs my first available EGD at [facility] for personal history of gastric cancer. Thanks     ----- Message -----  From: Alla Feeling, NP  Sent: 10/24/2020  9:32 PM EDT  To: Jonnie Finner, RN, [name], MD, *   My nurse,  Please let him know his labs, he has mild anemia with iron deficiency ferritin 10, since anemia is not bad he could probably do oral iron BID, but if he is symptomatic I would recommend IV iron for faster replacement.   He has not had iron in the past, Seth Bake which formulation does his insurance approve?   Also B12 is borderline low 182, and he would probably benefit from monthly B12 injections. He has multiple other co-morbidities.   Dr. Burr Medico saw him last week he was doing well clinically. But looking back on his last EGD 07/2019 Dr. [name] was concerned he had pre-cancerous changes, and is likely overdue for endoscopy.   Not sure if we need to discuss him in tumor board or not, just don't want him falling through the cracks.   Thanks,  Regan Rakers

## 2020-10-25 NOTE — Telephone Encounter (Signed)
Scheduled appt per 4/18 sch msg. Pt's daughter is aware.

## 2020-10-29 LAB — METHYLMALONIC ACID, SERUM: Methylmalonic Acid, Quantitative: 1041 nmol/L — ABNORMAL HIGH (ref 0–378)

## 2020-11-02 ENCOUNTER — Other Ambulatory Visit: Payer: Self-pay | Admitting: Pulmonary Disease

## 2020-11-02 MED ORDER — IPRATROPIUM-ALBUTEROL 0.5-2.5 (3) MG/3ML IN SOLN
3.0000 mL | RESPIRATORY_TRACT | 5 refills | Status: DC | PRN
Start: 2020-11-02 — End: 2020-12-28

## 2020-11-07 ENCOUNTER — Other Ambulatory Visit: Payer: Self-pay | Admitting: Hematology

## 2020-11-08 ENCOUNTER — Telehealth: Payer: Self-pay

## 2020-11-08 ENCOUNTER — Telehealth: Payer: Self-pay | Admitting: Hematology and Oncology

## 2020-11-08 NOTE — Telephone Encounter (Signed)
-----   Message from [name], MD sent at 11/07/2020 10:28 AM EDT ----- Let pt (he will be here for iv iron and B12 injectin on 5/3) or his daughter know that his B12 level is quite low, and I recommend adding wekely B12 X7 after 5/2 , thanks   [name]  11/07/2020

## 2020-11-08 NOTE — Telephone Encounter (Signed)
Called pt daughter left a message concerning most recent labs recommendations for weekly B12 injection x 7 scheduling message sent for injections encouraged to call for any questions concerns or changes

## 2020-11-08 NOTE — Telephone Encounter (Signed)
Scheduled appts per 5/2 sch msg. Pt's daughter is aware.

## 2020-11-09 ENCOUNTER — Emergency Department (HOSPITAL_COMMUNITY): Payer: Medicare Other

## 2020-11-09 ENCOUNTER — Encounter (HOSPITAL_COMMUNITY): Payer: Self-pay | Admitting: Student

## 2020-11-09 ENCOUNTER — Observation Stay (HOSPITAL_COMMUNITY)
Admission: EM | Admit: 2020-11-09 | Discharge: 2020-11-10 | Disposition: A | Discharge status: Home / Self Care | Payer: Medicare Other | Attending: Internal Medicine | Admitting: Internal Medicine

## 2020-11-09 ENCOUNTER — Inpatient Hospital Stay: Payer: Medicare Other | Attending: Hematology

## 2020-11-09 ENCOUNTER — Other Ambulatory Visit: Payer: Self-pay

## 2020-11-09 VITALS — BP 98/62 | HR 89 | Resp 18

## 2020-11-09 DIAGNOSIS — E538 Deficiency of other specified B group vitamins: Secondary | ICD-10-CM | POA: Insufficient documentation

## 2020-11-09 DIAGNOSIS — J439 Emphysema, unspecified: Secondary | ICD-10-CM | POA: Diagnosis present

## 2020-11-09 DIAGNOSIS — Z85028 Personal history of other malignant neoplasm of stomach: Secondary | ICD-10-CM | POA: Insufficient documentation

## 2020-11-09 DIAGNOSIS — R55 Syncope and collapse: Secondary | ICD-10-CM | POA: Diagnosis present

## 2020-11-09 DIAGNOSIS — R413 Other amnesia: Secondary | ICD-10-CM | POA: Insufficient documentation

## 2020-11-09 DIAGNOSIS — I10 Essential (primary) hypertension: Secondary | ICD-10-CM | POA: Insufficient documentation

## 2020-11-09 DIAGNOSIS — C169 Malignant neoplasm of stomach, unspecified: Secondary | ICD-10-CM | POA: Diagnosis present

## 2020-11-09 DIAGNOSIS — Z79899 Other long term (current) drug therapy: Secondary | ICD-10-CM | POA: Insufficient documentation

## 2020-11-09 DIAGNOSIS — F17210 Nicotine dependence, cigarettes, uncomplicated: Secondary | ICD-10-CM | POA: Diagnosis present

## 2020-11-09 DIAGNOSIS — D509 Iron deficiency anemia, unspecified: Secondary | ICD-10-CM | POA: Insufficient documentation

## 2020-11-09 DIAGNOSIS — Z9981 Dependence on supplemental oxygen: Secondary | ICD-10-CM | POA: Insufficient documentation

## 2020-11-09 DIAGNOSIS — Z8616 Personal history of COVID-19: Secondary | ICD-10-CM | POA: Diagnosis not present

## 2020-11-09 DIAGNOSIS — J449 Chronic obstructive pulmonary disease, unspecified: Secondary | ICD-10-CM | POA: Diagnosis not present

## 2020-11-09 DIAGNOSIS — R636 Underweight: Secondary | ICD-10-CM | POA: Diagnosis present

## 2020-11-09 DIAGNOSIS — Z20822 Contact with and (suspected) exposure to covid-19: Secondary | ICD-10-CM | POA: Diagnosis not present

## 2020-11-09 DIAGNOSIS — J441 Chronic obstructive pulmonary disease with (acute) exacerbation: Secondary | ICD-10-CM

## 2020-11-09 DIAGNOSIS — F32A Depression, unspecified: Secondary | ICD-10-CM | POA: Insufficient documentation

## 2020-11-09 DIAGNOSIS — J9611 Chronic respiratory failure with hypoxia: Secondary | ICD-10-CM | POA: Diagnosis present

## 2020-11-09 DIAGNOSIS — F419 Anxiety disorder, unspecified: Secondary | ICD-10-CM | POA: Insufficient documentation

## 2020-11-09 DIAGNOSIS — Z95828 Presence of other vascular implants and grafts: Secondary | ICD-10-CM

## 2020-11-09 DIAGNOSIS — Y900 Blood alcohol level of less than 20 mg/100 ml: Secondary | ICD-10-CM | POA: Diagnosis not present

## 2020-11-09 LAB — CBC WITH DIFFERENTIAL/PLATELET
Abs Immature Granulocytes: 0.02 10*3/uL (ref 0.00–0.07)
Basophils Absolute: 0.1 10*3/uL (ref 0.0–0.1)
Basophils Relative: 1 %
Eosinophils Absolute: 0 10*3/uL (ref 0.0–0.5)
Eosinophils Relative: 0 %
HCT: 54.3 % — ABNORMAL HIGH (ref 39.0–52.0)
Hemoglobin: 16.9 g/dL (ref 13.0–17.0)
Immature Granulocytes: 0 %
Lymphocytes Relative: 47 %
Lymphs Abs: 5.5 10*3/uL — ABNORMAL HIGH (ref 0.7–4.0)
MCH: 29 pg (ref 26.0–34.0)
MCHC: 31.1 g/dL (ref 30.0–36.0)
MCV: 93.3 fL (ref 80.0–100.0)
Monocytes Absolute: 0.9 10*3/uL (ref 0.1–1.0)
Monocytes Relative: 7 %
Neutro Abs: 5.4 10*3/uL (ref 1.7–7.7)
Neutrophils Relative %: 45 %
Platelets: 222 10*3/uL (ref 150–400)
RBC: 5.82 MIL/uL — ABNORMAL HIGH (ref 4.22–5.81)
RDW: 13.2 % (ref 11.5–15.5)
WBC: 12 10*3/uL — ABNORMAL HIGH (ref 4.0–10.5)
nRBC: 0 % (ref 0.0–0.2)

## 2020-11-09 LAB — COMPREHENSIVE METABOLIC PANEL
ALT: 11 U/L (ref 0–44)
AST: 17 U/L (ref 15–41)
Albumin: 3.7 g/dL (ref 3.5–5.0)
Alkaline Phosphatase: 71 U/L (ref 38–126)
Anion gap: 12 (ref 5–15)
BUN: 12 mg/dL (ref 8–23)
CO2: 30 mmol/L (ref 22–32)
Calcium: 9 mg/dL (ref 8.9–10.3)
Chloride: 99 mmol/L (ref 98–111)
Creatinine, Ser: 0.44 mg/dL — ABNORMAL LOW (ref 0.61–1.24)
GFR, Estimated: >60 mL/min (ref >60)
Glucose, Bld: 163 mg/dL — ABNORMAL HIGH (ref 70–99)
Potassium: 4.6 mmol/L (ref 3.5–5.1)
Sodium: 141 mmol/L (ref 135–145)
Total Bilirubin: 0.7 mg/dL (ref 0.3–1.2)
Total Protein: 6.3 g/dL — ABNORMAL LOW (ref 6.5–8.1)

## 2020-11-09 LAB — RESP PANEL BY RT-PCR (FLU A&B, COVID) ARPGX2
Influenza A by PCR: NEGATIVE
Influenza B by PCR: NEGATIVE
SARS Coronavirus 2 by RT PCR: NEGATIVE

## 2020-11-09 LAB — URINALYSIS, ROUTINE W REFLEX MICROSCOPIC
Bilirubin Urine: NEGATIVE
Glucose, UA: NEGATIVE mg/dL
Hgb urine dipstick: NEGATIVE
Ketones, ur: NEGATIVE mg/dL
Leukocytes,Ua: NEGATIVE
Nitrite: NEGATIVE
Protein, ur: NEGATIVE mg/dL
Specific Gravity, Urine: 1.011 (ref 1.005–1.030)
pH: 6 (ref 5.0–8.0)

## 2020-11-09 LAB — TROPONIN I (HIGH SENSITIVITY)
Troponin I (High Sensitivity): 4 ng/L (ref <18)
Troponin I (High Sensitivity): 3 ng/L (ref <18)

## 2020-11-09 LAB — LACTIC ACID, PLASMA
Lactic Acid, Venous: 3.6 mmol/L (ref 0.5–1.9)
Lactic Acid, Venous: 1.2 mmol/L (ref 0.5–1.9)

## 2020-11-09 LAB — D-DIMER, QUANTITATIVE: D-Dimer, Quant: 1.94 ug/mL-FEU — ABNORMAL HIGH (ref 0.00–0.50)

## 2020-11-09 LAB — CBG MONITORING, ED: Glucose-Capillary: 135 mg/dL — ABNORMAL HIGH (ref 70–99)

## 2020-11-09 LAB — ETHANOL: Alcohol, Ethyl (B): <10 mg/dL (ref <10)

## 2020-11-09 MED ORDER — SODIUM CHLORIDE 0.9 % IV SOLN: INTRAVENOUS | Status: DC

## 2020-11-09 MED ORDER — SODIUM CHLORIDE 0.9 % IV BOLUS
30.0000 mL/kg | Freq: Once | INTRAVENOUS | Status: AC
Start: 2020-11-09 — End: 2020-11-09
  Administered 2020-11-09: 1320 mL via INTRAVENOUS

## 2020-11-09 MED ORDER — SODIUM CHLORIDE 0.9 % IV BOLUS
1000.0000 mL | Freq: Once | INTRAVENOUS | Status: AC
  Administered 2020-11-09: 1000 mL via INTRAVENOUS

## 2020-11-09 MED ORDER — CYANOCOBALAMIN 1000 MCG/ML IJ SOLN
1000.0000 ug | Freq: Once | INTRAMUSCULAR | Status: AC
Start: 2020-11-09 — End: 2020-11-09
  Administered 2020-11-09: 1000 ug via INTRAMUSCULAR

## 2020-11-09 MED ORDER — ENOXAPARIN SODIUM 30 MG/0.3ML IJ SOSY
30.0000 mg | PREFILLED_SYRINGE | INTRAMUSCULAR | Status: DC
  Administered 2020-11-09: 30 mg via SUBCUTANEOUS
  Filled 2020-11-09: qty 0.3

## 2020-11-09 MED ORDER — BUDESON-GLYCOPYRROL-FORMOTEROL 160-9-4.8 MCG/ACT IN AERO: 2.0000 | INHALATION_SPRAY | Freq: Two times a day (BID) | RESPIRATORY_TRACT | Status: DC

## 2020-11-09 MED ORDER — IPRATROPIUM-ALBUTEROL 0.5-2.5 (3) MG/3ML IN SOLN
3.0000 mL | RESPIRATORY_TRACT | Status: DC | PRN
  Administered 2020-11-10 (×2): 3 mL via RESPIRATORY_TRACT
  Filled 2020-11-09 (×2): qty 3

## 2020-11-09 MED ORDER — IOHEXOL 350 MG/ML SOLN
80.0000 mL | Freq: Once | INTRAVENOUS | Status: AC | PRN
  Administered 2020-11-09: 80 mL via INTRAVENOUS

## 2020-11-09 MED ORDER — ENSURE ENLIVE PO LIQD
237.0000 mL | Freq: Two times a day (BID) | ORAL | Status: DC
  Administered 2020-11-10: 237 mL via ORAL

## 2020-11-09 MED ORDER — CYANOCOBALAMIN 1000 MCG/ML IJ SOLN
INTRAMUSCULAR | Status: AC
  Filled 2020-11-09: qty 1

## 2020-11-09 MED ORDER — MIRTAZAPINE 15 MG PO TABS
7.5000 mg | ORAL_TABLET | Freq: Every day | ORAL | Status: DC
  Administered 2020-11-09: 7.5 mg via ORAL
  Filled 2020-11-09: qty 1

## 2020-11-09 MED ORDER — HEPARIN SOD (PORK) LOCK FLUSH 100 UNIT/ML IV SOLN
500.0000 [IU] | Freq: Once | INTRAVENOUS | Status: AC
  Administered 2020-11-09: 500 [IU]
  Filled 2020-11-09: qty 5

## 2020-11-09 MED ORDER — SODIUM CHLORIDE 0.9% FLUSH
10.0000 mL | Freq: Once | INTRAVENOUS | Status: AC
Start: 2020-11-09 — End: 2020-11-09
  Administered 2020-11-09: 10 mL
  Filled 2020-11-09: qty 10

## 2020-11-09 MED ORDER — SODIUM CHLORIDE 0.9 % IV SOLN
1.0000 g | Freq: Once | INTRAVENOUS | Status: AC
  Administered 2020-11-09: 1 g via INTRAVENOUS
  Filled 2020-11-09: qty 10

## 2020-11-09 MED ORDER — SODIUM CHLORIDE 0.9 % IV SOLN
400.0000 mg | Freq: Once | INTRAVENOUS | Status: AC
  Administered 2020-11-09: 400 mg via INTRAVENOUS
  Filled 2020-11-09: qty 20

## 2020-11-09 MED ORDER — SODIUM CHLORIDE 0.9 % IV SOLN
Freq: Once | INTRAVENOUS | Status: AC
Start: 2020-11-09 — End: 2020-11-09
  Filled 2020-11-09: qty 250

## 2020-11-09 MED ORDER — IPRATROPIUM-ALBUTEROL 0.5-2.5 (3) MG/3ML IN SOLN
3.0000 mL | Freq: Once | RESPIRATORY_TRACT | Status: AC
  Administered 2020-11-09: 3 mL via RESPIRATORY_TRACT
  Filled 2020-11-09: qty 3

## 2020-11-09 MED ORDER — SODIUM CHLORIDE 0.9 % IV SOLN
500.0000 mg | Freq: Once | INTRAVENOUS | Status: AC
  Administered 2020-11-09: 500 mg via INTRAVENOUS
  Filled 2020-11-09: qty 500

## 2020-11-09 MED ORDER — ONDANSETRON HCL 4 MG/2ML IJ SOLN
4.0000 mg | Freq: Once | INTRAMUSCULAR | Status: AC
  Administered 2020-11-09: 4 mg via INTRAVENOUS
  Filled 2020-11-09: qty 2

## 2020-11-09 NOTE — ED Provider Notes (Signed)
  Physical Exam  BP 118/83   Pulse (!) 115   Resp 12   Ht 5\' 5"  (1.651 m)   Wt 44 kg   SpO2 100%   BMI 16.14 kg/m   Physical Exam  ED Course/Procedures     Procedures  MDM  Care assumed at 3 PM.  Patient is from oncology. Patient is also wearing 4 L nasal cannula at baseline.  Patient apparently had a CODE BLUE at oncology today.  He was supposed to get B12 and iron infusions but apparently refused and walked outside and had trouble breathing.  CTA chest was ordered.  Signout pending CTA and admission.  5:00 PM Lactate is 3.6.  Given antibiotics empirically.  Patient is still tachycardic.  Initial troponin negative.  Given that he had CODE BLUE and almost had a respiratory arrest, will need admission for observation.       Drenda Freeze, MD 11/09/20 [phone]

## 2020-11-09 NOTE — ED Provider Notes (Signed)
[facility] DEPT Provider Note   CSN: [id number] Arrival date & time: 11/09/20  1258     History Chief Complaint  Patient presents with  . Respiratory Distress    Bobby Caldwell is a 65 y.o. male.  He was accompanied by his oncologist who explained that the patient has been cancer free for years.  He does have a history of gastric cancer.  He also has COPD and uses 4 L of oxygen daily.  He has been referred to pulmonology.  The history is provided by the patient (medical personnel). The history is limited by a language barrier. A language interpreter was used.  Loss of Consciousness Episode history:  Single Most recent episode:  Today Timing:  Constant Progression:  Resolved Chronicity:  New Context comment:  He was at the cancer center for approximately 4 hours receiving B12 and iron infusions.  He walked outside, and began to feel as if his vision was blurry.  He was brought to the ED after he passed out. Witnessed: yes   Worsened by:  Nothing Ineffective treatments:  None tried Associated symptoms: anxiety, chest pain, nausea and shortness of breath   Associated symptoms: no fever, no headaches, no palpitations, no seizures and no vomiting   Associated symptoms comment:  Admits to anxiety related to family issues      Past Medical History:  Diagnosis Date  . COPD (chronic obstructive pulmonary disease) ([place])   . Dyspnea   . PAD (peripheral artery disease) ([place])   . Stomach cancer ([place])   . Tobacco abuse   . Weight loss     Patient Active Problem List   Diagnosis Date Noted  . Weakness 11/18/2019  . Medication management 10/22/2019  . Healthcare maintenance 10/22/2019  . Acute on chronic respiratory failure with hypoxia and hypercapnia ([place]) 10/12/2019  . Hyponatremia 10/12/2019  . History of COVID-19 10/12/2019  . Acute respiratory failure with hypoxia ([place]) 09/11/2019  . Protein-calorie malnutrition, severe 09/11/2019  . Acute  respiratory failure with hypoxia and hypercapnia ([place]) 09/10/2019  . COPD with acute exacerbation ([place]) 09/10/2019  . History of gastric cancer   . Gastric mass   . Dysphagia   . Chronic hypoxemic respiratory failure ([place]) 03/13/2018  . S/P gastric surgery 01/22/2018  . Nausea 12/20/2017  . 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 S/P vascular surgery 11/15/2017  . Pneumonia 09/10/2017  . Acute on chronic respiratory failure with hypoxia ([place]) 09/10/2017  . Exercise hypoxemia 09/04/2017  . Moderate protein-calorie malnutrition ([place]) 07/24/2017  . Port-A-Cath in place 07/06/2017  . DOE (dyspnea on exertion) 06/12/2017  . Atypical chest pain 06/12/2017  . Essential hypertension 06/12/2017  . Preop cardiovascular exam 06/12/2017  . Gastric adenocarcinoma ([place]) 06/04/2017  . Gastritis and gastroduodenitis   . Atherosclerosis of native arteries of extremity with intermittent claudication ([place]) 05/03/2017  . Critical lower limb ischemia ([place]) 05/03/2017  . Abnormal CT of the chest 08/02/2015  . Underweight 03/30/2015  . 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 Weight loss 02/16/2015  . Cigarette smoker 02/16/2015    Past Surgical History:  Procedure Laterality Date  . ABDOMINAL AORTOGRAM W/LOWER EXTREMITY N/A 05/03/2017   Procedure: ABDOMINAL AORTOGRAM W/LOWER EXTREMITY;  Surgeon: Conrad [place], MD;  Location: [facility];  Service: Cardiovascular;  Laterality: N/A;  . BIOPSY  08/22/2018   Procedure: BIOPSY;  Surgeon: [name], MD;  Location: [facility];  Service: Endoscopy;;  . BIOPSY  07/31/2019   Procedure: BIOPSY;  Surgeon: [name], MD;  Location: [facility];  Service: Endoscopy;;  . ESOPHAGOGASTRODUODENOSCOPY     05/24/17  . ESOPHAGOGASTRODUODENOSCOPY (EGD) WITH PROPOFOL N/A 05/24/2017   Procedure: ESOPHAGOGASTRODUODENOSCOPY (EGD) WITH PROPOFOL;  Surgeon: [name], MD;  [facility];  Service: Endoscopy;  Laterality: N/A;  . ESOPHAGOGASTRODUODENOSCOPY (EGD) WITH PROPOFOL N/A 08/22/2018   Procedure: ESOPHAGOGASTRODUODENOSCOPY (EGD) WITH PROPOFOL;  Surgeon: [name], MD;  [facility];  Service: Endoscopy;  Laterality: N/A;  . ESOPHAGOGASTRODUODENOSCOPY (EGD) WITH PROPOFOL N/A 11/28/2018   Procedure: ESOPHAGOGASTRODUODENOSCOPY (EGD) WITH PROPOFOL;  Surgeon: [name], MD;  [facility];  Service: Endoscopy;  Laterality: N/A;  . ESOPHAGOGASTRODUODENOSCOPY (EGD) WITH PROPOFOL N/A 07/31/2019   Procedure: ESOPHAGOGASTRODUODENOSCOPY (EGD) WITH PROPOFOL;  Surgeon: [name], MD;  [facility];  Service: Endoscopy;  Laterality: N/A;  . EUS N/A 06/07/2017   Procedure: UPPER ENDOSCOPIC ULTRASOUND (EUS) RADIAL;  Surgeon: [name], MD;  [facility]: [facility];  Service: Endoscopy;  Laterality: N/A;  . FEMORAL-FEMORAL BYPASS GRAFT Bilateral 05/08/2017   Procedure: LEFT TO RIGHT BYPASS GRAFT FEMORAL-FEMORAL ARTERY;  Surgeon: Conrad [place], MD;  Location: [facility];  Service: Vascular;  Laterality: Bilateral;  . IR FLUORO GUIDE PORT INSERTION RIGHT  07/05/2017  . IR US GUIDE VASC ACCESS RIGHT  07/05/2017  . LAPAROSCOPIC INSERTION GASTROSTOMY TUBE N/A 11/27/2017   Procedure: JEJUNOSTOMY TUBE PLACEMENT;  Surgeon: [name], MD;  Location: [facility];  Service: General;  Laterality: N/A;  . LAPAROSCOPIC PARTIAL GASTRECTOMY N/A 11/27/2017   Procedure: PARTIAL GASTRECTOMY;  Surgeon: [name], MD;  Location: [facility];  Service: General;  Laterality: N/A;  . LAPAROSCOPY N/A 11/27/2017   Procedure: LAPAROSCOPY DIAGNOSTIC;  Surgeon: [name], MD;  Location: [facility];  Service: General;  Laterality: N/A;  . NM MYOVIEW LTD  06/2017   LOW RISK. Small basal inferoseptal defect thought to be related to artifact (although cannot exclude prior infarct). Normal wall motion i in this area would suggest artifact not infarct. Otherwise no ischemia  or infarction.  Marland Kitchen PERIPHERAL VASCULAR INTERVENTION Left 05/03/2017   Procedure: PERIPHERAL VASCULAR INTERVENTION;  Surgeon: Conrad [place], MD;  Location: [facility];  Service: Cardiovascular;  Laterality: Left;  common iliac  . POLYPECTOMY  11/28/2018   Procedure: POLYPECTOMY;  Surgeon: [name], MD;  [facility];  Service: Endoscopy;;  . TRANSTHORACIC ECHOCARDIOGRAM  06/26/2017   Normal LV size and function. EF 60-65%. No wall motion abnormalities. GR 1 DD. Mild LA dilation       Family History  Family history unknown: Yes    Social History   Tobacco Use  . Smoking status: Current Every Day Smoker    Packs/day: 1.50    Years: 50.00    Pack years: 75.00    Types: Cigarettes    Last attempt to quit: 11/2017    Years since quitting: 3.0  . Smokeless tobacco: Never Used  . Tobacco comment: 10-12 cigarettes/24 hours;using patch  Vaping Use  . Vaping Use: Never used  Substance Use Topics  . Alcohol use: No    Alcohol/week: 0.0 standard drinks  . Drug use: No    Home Medications Prior to Admission medications   Medication Sig Start Date End Date Taking? Authorizing Provider  Budeson-Glycopyrrol-Formoterol (BREZTRI AEROSPHERE) 160-9-4.8 MCG/ACT AERO Inhale 2 puffs into the lungs 2 (two) times daily. 08/12/20   Laurin Coder, MD  feeding supplement, ENSURE ENLIVE, (ENSURE ENLIVE)  LIQD Take 237 mLs by mouth 2 (two) times daily between meals. 09/13/19   Mariel Aloe, MD  ipratropium-albuterol (DUONEB) 0.5-2.5 (3) MG/3ML SOLN Take 3 mLs by nebulization every 4 (four) hours as needed. Dx J44.9 11/02/20   Laurin Coder, MD  mirtazapine (REMERON) 7.5 MG tablet Take 1 tablet (7.5 mg total) by mouth at bedtime. 10/22/20   [name], MD  Multiple Vitamin (MULTIVITAMIN WITH MINERALS) TABS tablet Take 1 tablet by mouth daily. 09/13/19   Mariel Aloe, MD  ondansetron (ZOFRAN) 4 MG tablet Take 1 tablet (4 mg total) by mouth every 8 (eight) hours as needed for  nausea or vomiting. 03/23/20   [name], MD  OXYGEN Inhale 2 L into the lungs continuous.    [provider]    Allergies    Patient has no known allergies.  Review of Systems   Review of Systems  Constitutional: Negative for chills and fever.  HENT: Negative for ear pain and sore throat.   Eyes: Negative for pain and visual disturbance.  Respiratory: Positive for shortness of breath. Negative for cough.   Cardiovascular: Positive for chest pain and syncope. Negative for palpitations.  Gastrointestinal: Positive for nausea. Negative for abdominal pain and vomiting.  Genitourinary: Negative for dysuria and hematuria.  Musculoskeletal: Negative for arthralgias and back pain.  Skin: Negative for color change and rash.  Neurological: Positive for syncope. Negative for seizures and headaches.  All other systems reviewed and are negative.   Physical Exam Updated Vital Signs BP (!) 118/93   Pulse (!) 101   Resp (!) 28   Ht 5\' 5"  (1.651 m)   Wt 44 kg   SpO2 95%   BMI 16.14 kg/m   Physical Exam Vitals and nursing note reviewed.  Constitutional:      Appearance: He is well-developed.     Comments: Thin, pale, mild distress  HENT:     Head: Normocephalic and atraumatic.  Eyes:     Conjunctiva/sclera: Conjunctivae normal.  Cardiovascular:     Rate and Rhythm: Regular rhythm. Tachycardia present.     Heart sounds: No murmur heard.   Pulmonary:     Effort: Pulmonary effort is normal. No respiratory distress.     Breath sounds: Normal breath sounds. No wheezing or rhonchi.     Comments: Decreased breath sounds throughout all lung fields Abdominal:     Palpations: Abdomen is soft.     Tenderness: There is no abdominal tenderness.  Musculoskeletal:        General: No deformity or signs of injury.     Cervical back: Neck supple.  Skin:    General: Skin is warm and dry.     Coloration: Skin is pale.  Neurological:     General: No focal deficit present.     Mental  Status: He is alert.     Cranial Nerves: No cranial nerve deficit.     Sensory: No sensory deficit.     Motor: No weakness.  Psychiatric:        Mood and Affect: Mood normal.     ED Results / Procedures / Treatments   Labs (all labs ordered are listed, but only abnormal results are displayed) Labs Reviewed  COMPREHENSIVE METABOLIC PANEL - Abnormal; Notable for the following components:      Result Value   Glucose, Bld 163 (*)    Creatinine, Ser 0.44 (*)    Total Protein 6.3 (*)    All other components within normal  limits  LACTIC ACID, PLASMA - Abnormal; Notable for the following components:   Lactic Acid, Venous 3.6 (*)    All other components within normal limits  CBC WITH DIFFERENTIAL/PLATELET - Abnormal; Notable for the following components:   WBC 12.0 (*)    RBC 5.82 (*)    HCT 54.3 (*)    All other components within normal limits  D-DIMER, QUANTITATIVE - Abnormal; Notable for the following components:   D-Dimer, Quant 1.94 (*)    All other components within normal limits  CBG MONITORING, ED - Abnormal; Notable for the following components:   Glucose-Capillary 135 (*)    All other components within normal limits  CULTURE, BLOOD (ROUTINE X 2)  CULTURE, BLOOD (ROUTINE X 2)  RESP PANEL BY RT-PCR (FLU A&B, COVID) ARPGX2  ETHANOL  LACTIC ACID, PLASMA  URINALYSIS, ROUTINE W REFLEX MICROSCOPIC  TROPONIN I (HIGH SENSITIVITY)  TROPONIN I (HIGH SENSITIVITY)    EKG None  Radiology DG Chest Port 1 View  Result Date: 11/09/2020 CLINICAL DATA:  Dyspnea. Patient underwent an iron transfusion today. History of stomach cancer EXAM: PORTABLE CHEST 1 VIEW COMPARISON:  11/18/2019 and older studies. FINDINGS: Cardiac silhouette is normal in size. Mediastinal or hilar masses. Right anterior chest wall Port-A-Cath is stable, tip in the lower superior vena cava. Lungs are markedly hyperexpanded, but clear. No pleural effusion or pneumothorax. Skeletal structures are grossly intact.  IMPRESSION: 1. No acute cardiopulmonary disease. 2. Advanced COPD. Electronically Signed   By: Lajean Manes M.D.   On: 11/09/2020 14:12    Procedures .Critical Care Performed by: Arnaldo Natal, MD Authorized by: Arnaldo Natal, MD   Critical care provider statement:    Critical care time (minutes):  30   Critical care time was exclusive of:  Separately billable procedures and treating other patients and teaching time   Critical care was necessary to treat or prevent imminent or life-threatening deterioration of the following conditions:  CNS failure or compromise, dehydration and sepsis   Critical care was time spent personally by me on the following activities:  Blood draw for specimens, discussions with consultants, evaluation of patient's response to treatment, examination of patient, obtaining history from patient or surrogate, re-evaluation of patient's condition, review of old charts, pulse oximetry, ordering and review of radiographic studies, ordering and review of laboratory studies and ordering and performing treatments and interventions   I assumed direction of critical care for this patient from another provider in my specialty: no       Medications Ordered in ED Medications  ipratropium-albuterol (DUONEB) 0.5-2.5 (3) MG/3ML nebulizer solution 3 mL (has no administration in time range)  cefTRIAXone (ROCEPHIN) 1 g in sodium chloride 0.9 % 100 mL IVPB (has no administration in time range)  azithromycin (ZITHROMAX) 500 mg in sodium chloride 0.9 % 250 mL IVPB (has no administration in time range)  sodium chloride 0.9 % bolus 1,000 mL (has no administration in time range)  sodium chloride 0.9 % bolus 1,000 mL (0 mLs Intravenous Stopped 11/09/20 1403)  ondansetron (ZOFRAN) injection 4 mg (4 mg Intravenous Given 11/09/20 1323)  sodium chloride 0.9 % bolus 1,320 mL (1,320 mLs Intravenous New Bag/Given 11/09/20 1438)    ED Course  I have reviewed the triage vital signs and the nursing  notes.  Pertinent labs & imaging results that were available during my care of the patient were reviewed by me and considered in my medical decision making (see chart for details).    MDM Rules/Calculators/A&P  Bobby Caldwell presented to the ED after a syncopal episode.  He initially appeared to be pale and was having difficulty providing a history.  History was somewhat limited initially due to his need for an [place] interpreter.  Interpreter was obtained, and he provided a history.  He endorsed multiple complaints and stated that he was still feeling a little bit lightheaded and nauseated.  He was evaluated for evidence of CNS pathology, respiratory pathology, infection, or other emergent etiology of his symptoms.  Care was transferred to Dr. Darl Householder. Final Clinical Impression(s) / ED Diagnoses Final diagnoses:  Syncope, unspecified syncope type    Rx / DC Orders ED Discharge Orders    None       Arnaldo Natal, MD 11/09/20 1540

## 2020-11-09 NOTE — Patient Instructions (Signed)

## 2020-11-09 NOTE — H&P (Signed)
History and Physical    Bobby Caldwell ZMO:[id number] DOB: 06-19-1956 DOA: 11/09/2020  PCP: Malachy Mood, MD   Patient coming from: Cancer Center    Chief Complaint: Syncopal episode  HPI: Bobby Caldwell is a 65 y.o. 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  Patient has history of gastric cancer and is currently in remission, follows with oncology.  Patient speaks [place] so the information was gathered from the daughter at the bedside.  As per the report, he felt lightheaded and passed out, and he was about to leave to home from cancer center in his [employer].  No report of loss of consciousness or head injury.  CODE BLUE was called because he was found to be blue and appeared to be apneic.  He was then brought to the emergency department. Patient seen and examined at the bedside in the emergency department.  During my evaluation, his blood pressure was soft but stable, he was in sinus tachycardia.  He was on baseline oxygen requirement at 4 L/min.  He denies any chest pain, shortness of breath, palpitations, abdominal pain, nausea, vomiting, diarrhea, headache, fever.  As per the daughter, he has been weak for a while, does not ambulate that much but can go to his bathroom and then spends most of the time in bed.  He continuously smoke occasionally.  He said he wants to go home today.  ED Course: Found to be hypotensive on presentation.  IV fluid bolus was ordered.  Lactic acid she was found to be elevated.Troponins are normal.D-dimer mildly elevated.  CT angiogram did not show any PE.  Chest x-ray did not show pneumonia.  CT head did not show any acute intracranial abnormalities.  Patient was admitted for syncopal work-up.  Review of Systems:  As per HPI otherwise 10 point review of systems negative.    Past Medical History:  Diagnosis Date  . COPD (chronic obstructive pulmonary disease) (HCC)   . Dyspnea   . PAD (peripheral artery disease) (HCC)   . Stomach cancer (HCC)   . Tobacco abuse   . Weight loss     Past Surgical History:  Procedure Laterality Date  . ABDOMINAL AORTOGRAM W/LOWER EXTREMITY N/A 05/03/2017   Procedure: ABDOMINAL AORTOGRAM W/LOWER EXTREMITY;  Surgeon: Fransisco Hertz, MD;  Location: [facility] INVASIVE CV LAB;  Service: Cardiovascular;  Laterality: N/A;  . BIOPSY  08/22/2018   Procedure: BIOPSY;  Surgeon: Rachael Fee, MD;  Location: [facility];  Service: Endoscopy;;  . BIOPSY  07/31/2019   Procedure: BIOPSY;  Surgeon: Rachael Fee, MD;  Location: [facility];  Service: Endoscopy;;  . ESOPHAGOGASTRODUODENOSCOPY     05/24/17  . ESOPHAGOGASTRODUODENOSCOPY (EGD) WITH PROPOFOL N/A 05/24/2017   Procedure: ESOPHAGOGASTRODUODENOSCOPY (EGD) WITH PROPOFOL;  Surgeon: Rachael Fee, MD;  Location: [facility];  Service: Endoscopy;  Laterality: N/A;  . ESOPHAGOGASTRODUODENOSCOPY (EGD) WITH PROPOFOL N/A 08/22/2018   Procedure: ESOPHAGOGASTRODUODENOSCOPY (EGD) WITH PROPOFOL;  Surgeon: Rachael Fee, MD;  Location: [facility];  Service: Endoscopy;  Laterality: N/A;  . ESOPHAGOGASTRODUODENOSCOPY (EGD) WITH PROPOFOL N/A 11/28/2018   Procedure: ESOPHAGOGASTRODUODENOSCOPY (EGD) WITH PROPOFOL;  Surgeon: Rachael Fee, MD;  Location: [facility];  Service: Endoscopy;  Laterality: N/A;  . ESOPHAGOGASTRODUODENOSCOPY (EGD) WITH PROPOFOL N/A 07/31/2019  Procedure: ESOPHAGOGASTRODUODENOSCOPY (EGD) WITH PROPOFOL;  Surgeon: [name], MD;  Location: [facility];  Service: Endoscopy;  Laterality: N/A;  . EUS N/A 06/07/2017   Procedure: UPPER ENDOSCOPIC ULTRASOUND (EUS) RADIAL;  Surgeon: [name], MD;  Location: [facility];  Service: Endoscopy;  Laterality: N/A;  . FEMORAL-FEMORAL BYPASS GRAFT Bilateral  05/08/2017   Procedure: LEFT TO RIGHT BYPASS GRAFT FEMORAL-FEMORAL ARTERY;  Surgeon: Conrad Falkville, MD;  Location: [place];  Service: Vascular;  Laterality: Bilateral;  . IR FLUORO GUIDE PORT INSERTION RIGHT  07/05/2017  . IR US GUIDE VASC ACCESS RIGHT  07/05/2017  . LAPAROSCOPIC INSERTION GASTROSTOMY TUBE N/A 11/27/2017   Procedure: JEJUNOSTOMY TUBE PLACEMENT;  Surgeon: [name], MD;  Location: [place];  Service: General;  Laterality: N/A;  . LAPAROSCOPIC PARTIAL GASTRECTOMY N/A 11/27/2017   Procedure: PARTIAL GASTRECTOMY;  Surgeon: [name], MD;  Location: [place];  Service: General;  Laterality: N/A;  . LAPAROSCOPY N/A 11/27/2017   Procedure: LAPAROSCOPY DIAGNOSTIC;  Surgeon: [name], MD;  Location: [place];  Service: General;  Laterality: N/A;  . NM MYOVIEW LTD  06/2017   LOW RISK. Small basal inferoseptal defect thought to be related to artifact (although cannot exclude prior infarct). Normal wall motion i in this area would suggest artifact not infarct. Otherwise no ischemia or infarction.  Marland Kitchen PERIPHERAL VASCULAR INTERVENTION Left 05/03/2017   Procedure: PERIPHERAL VASCULAR INTERVENTION;  Surgeon: Conrad Zoar, MD;  Location: [place] CV LAB;  Service: Cardiovascular;  Laterality: Left;  common iliac  . POLYPECTOMY  11/28/2018   Procedure: POLYPECTOMY;  Surgeon: [name], MD;  Location: [facility];  Service: Endoscopy;;  . TRANSTHORACIC ECHOCARDIOGRAM  06/26/2017   Normal LV size and function. EF 60-65%. No wall motion abnormalities. GR 1 DD. Mild LA dilation     reports that he has been smoking cigarettes. He has a 75.00 pack-year smoking history. He has never used smokeless tobacco. He reports that he does not drink alcohol and does not use drugs.  No Known Allergies  Family History  Family history unknown: Yes     Prior to Admission medications   Medication Sig Start Date End Date Taking? Authorizing Provider  Budeson-Glycopyrrol-Formoterol (BREZTRI  AEROSPHERE) 160-9-4.8 MCG/ACT AERO Inhale 2 puffs into the lungs 2 (two) times daily. 08/12/20  Yes Olalere, Adewale A, MD  ipratropium-albuterol (DUONEB) 0.5-2.5 (3) MG/3ML SOLN Take 3 mLs by nebulization every 4 (four) hours as needed. Dx J44.9 Patient taking differently: Take 3 mLs by nebulization every 4 (four) hours as needed (wheezing). Dx J44.9 11/02/20  Yes Olalere, Adewale A, MD  mirtazapine (REMERON) 7.5 MG tablet Take 1 tablet (7.5 mg total) by mouth at bedtime. Patient taking differently: Take 7.5 mg by mouth daily as needed. depression 10/22/20  Yes [name], MD  Multiple Vitamin (MULTIVITAMIN WITH MINERALS) TABS tablet Take 1 tablet by mouth daily. 09/13/19  Yes Mariel Aloe, MD  ondansetron (ZOFRAN) 4 MG tablet Take 1 tablet (4 mg total) by mouth every 8 (eight) hours as needed for nausea or vomiting. 03/23/20  Yes [name], MD  OXYGEN Inhale 2 L into the lungs continuous.   Yes [provider]  feeding supplement, ENSURE ENLIVE, (ENSURE ENLIVE) LIQD Take 237 mLs by mouth 2 (two) times daily between meals. Patient not taking: Reported on 11/09/2020 09/13/19   Mariel Aloe, MD    Physical Exam: Vitals:   11/09/20 1500 11/09/20 1516 11/09/20 1621 11/09/20 1630  BP: (!) 118/93 128/89 130/87  118/83  Pulse: (!) 101 99 (!) 114 (!) 115  Resp: (!) 28 14 (!) 22 12  SpO2: 95% 96% 100% 100%  Weight:      Height:        Constitutional: Cachectic, malnourished, deconditioned Vitals:   11/09/20 1500 11/09/20 1516 11/09/20 1621 11/09/20 1630  BP: (!) 118/93 128/89 130/87 118/83  Pulse: (!) 101 99 (!) 114 (!) 115  Resp: (!) 28 14 (!) 22 12  SpO2: 95% 96% 100% 100%  Weight:      Height:       Eyes: PERRL, lids and conjunctivae normal ENMT: Mucous membranes are moist.  Neck: normal, supple, no masses, no thyromegaly Respiratory: Diminished air entry bilaterally, no wheezing, no crackles. Normal respiratory effort. No accessory muscle use.  Cardiovascular: Regular rate and  rhythm, no murmurs / rubs / gallops. No extremity edema.  Port-A-Cath on the right chest Abdomen: no tenderness, no masses palpated. No hepatosplenomegaly. Bowel sounds positive.  Musculoskeletal: no clubbing / cyanosis. No joint deformity upper and lower extremities.  Skin: no rashes, lesions, ulcers. No induration Neurologic: CN 2-12 grossly intact.  Strength 5/5 in all 4.  Psychiatric: Normal judgment and insight. Alert and oriented x 3. Normal mood.   Foley Catheter:None  Labs on Admission: I have personally reviewed following labs and imaging studies  CBC: Recent Labs  Lab 11/09/20 1300  WBC 12.0*  NEUTROABS PENDING  HGB 16.9  HCT 54.3*  MCV 93.3  PLT [id number]   Basic Metabolic Panel: Recent Labs  Lab 11/09/20 1300  NA 141  K 4.6  CL 99  CO2 30  GLUCOSE 163*  BUN 12  CREATININE 0.44*  CALCIUM 9.0   GFR: Estimated Creatinine Clearance: 58.8 mL/min (A) (by C-G formula based on SCr of 0.44 mg/dL (L)). Liver Function Tests: Recent Labs  Lab 11/09/20 1300  AST 17  ALT 11  ALKPHOS 71  BILITOT 0.7  PROT 6.3*  ALBUMIN 3.7   No results for input(s): LIPASE, AMYLASE in the last 168 hours. No results for input(s): AMMONIA in the last 168 hours. Coagulation Profile: No results for input(s): INR, PROTIME in the last 168 hours. Cardiac Enzymes: No results for input(s): CKTOTAL, CKMB, CKMBINDEX, TROPONINI in the last 168 hours. BNP (last 3 results) No results for input(s): PROBNP in the last 8760 hours. HbA1C: No results for input(s): HGBA1C in the last 72 hours. CBG: Recent Labs  Lab 11/09/20 1256  GLUCAP 135*   Lipid Profile: No results for input(s): CHOL, HDL, LDLCALC, TRIG, CHOLHDL, LDLDIRECT in the last 72 hours. Thyroid Function Tests: No results for input(s): TSH, T4TOTAL, FREET4, T3FREE, THYROIDAB in the last 72 hours. Anemia Panel: No results for input(s): VITAMINB12, FOLATE, FERRITIN, TIBC, IRON, RETICCTPCT in the last 72 hours. Urine analysis:     Component Value Date/Time   COLORURINE YELLOW 11/09/2020 [address] 11/09/2020 1545   LABSPEC 1.011 11/09/2020 1545   PHURINE 6.0 11/09/2020 1545   GLUCOSEU NEGATIVE 11/09/2020 1545   HGBUR NEGATIVE 11/09/2020 1545   [place] 11/09/2020 1545   [place] 11/09/2020 1545   PROTEINUR NEGATIVE 11/09/2020 1545   NITRITE NEGATIVE 11/09/2020 1545   LEUKOCYTESUR NEGATIVE 11/09/2020 1545    Radiological Exams on Admission: CT Head Wo Contrast  Result Date: 11/09/2020 CLINICAL DATA:  Altered mental status EXAM: CT HEAD WITHOUT CONTRAST TECHNIQUE: Contiguous axial images were obtained from the base of the skull through the vertex without intravenous contrast. COMPARISON:  October 12, 2019 FINDINGS: Brain:  Mild diffuse atrophy is stable. There is no intracranial mass, hemorrhage, extra-axial fluid collection, or midline shift. The brain parenchyma appears unremarkable. No appreciable acute infarct. Vascular: No hyperdense vessel. Calcification noted in each carotid siphon region. Skull: Bony calvarium appears intact. Sinuses/Orbits: Visualized paranasal sinuses are clear. Orbits appear symmetric bilaterally. There is leftward deviation of the nasal septum. Other: Mastoid air cells clear. IMPRESSION: Mild atrophy, stable. No mass or hemorrhage. Brain parenchyma unremarkable in appearance. Foci of arterial vascular calcification noted. Deviated nasal septum. Electronically Signed   By: Lowella Grip III M.D.   On: 11/09/2020 16:29   CT Angio Chest PE W/Cm &/Or Wo Cm  Result Date: 11/09/2020 CLINICAL DATA:  Shortness of breath with elevated D-dimer study EXAM: CT ANGIOGRAPHY CHEST WITH CONTRAST TECHNIQUE: Multidetector CT imaging of the chest was performed using the standard protocol during bolus administration of intravenous contrast. Multiplanar CT image reconstructions and MIPs were obtained to evaluate the vascular anatomy. CONTRAST:  64mL OMNIPAQUE IOHEXOL 350 MG/ML  SOLN COMPARISON:  Chest radiograph Nov 09, 2020; chest CT April 01, 2020 FINDINGS: Cardiovascular: No evident pulmonary embolus. No thoracic aortic aneurysm or dissection. Minimal scattered calcification in visualized great vessels. There are foci of aortic atherosclerosis. No appreciable pericardial effusion or pericardial thickening noted. Port-A-Cath tip in superior vena cava. Mediastinum/Nodes: Thyroid appears unremarkable. No evident thoracic adenopathy. No esophageal lesions are appreciable. Lungs/Pleura: There is underlying centrilobular emphysematous change. There is mild atelectatic change in the lung bases. No edema or airspace opacity. On axial slice 30 series 14, there is a stable 4 mm nodular opacity in the left upper lobe toward the apex. On axial slice 433 series 14, there is a 5 mm nodular opacity in the right middle lobe medially. There is a 4 mm nodular opacity in the anterior segment of the left lower lobe, a likely intrafissural lymph node, stable. There is a small granuloma in the right upper lobe. No new nodular opacities are evident. No pleural effusions. Upper Abdomen: Visualized upper abdominal structures appear unremarkable. Musculoskeletal: No blastic or lytic bone lesions. There is a port on the right anteriorly. Review of the MIP images confirms the above findings. IMPRESSION: 1. No evident pulmonary embolus. No thoracic aortic aneurysm or dissection. Foci of aortic atherosclerosis noted. Port-A-Cath present. 2. Emphysematous change. No edema or airspace opacity. Scattered pulmonary nodular opacities appear stable. No pleural effusions. 3.  No evident adenopathy. Aortic Atherosclerosis (ICD10-I70.0) and Emphysema (ICD10-J43.9). Electronically Signed   By: Lowella Grip III M.D.   On: 11/09/2020 16:40   DG Chest Port 1 View  Result Date: 11/09/2020 CLINICAL DATA:  Dyspnea. Patient underwent an iron transfusion today. History of stomach cancer EXAM: PORTABLE CHEST 1 VIEW  COMPARISON:  11/18/2019 and older studies. FINDINGS: Cardiac silhouette is normal in size. Mediastinal or hilar masses. Right anterior chest wall Port-A-Cath is stable, tip in the lower superior vena cava. Lungs are markedly hyperexpanded, but clear. No pleural effusion or pneumothorax. Skeletal structures are grossly intact. IMPRESSION: 1. No acute cardiopulmonary disease. 2. Advanced COPD. Electronically Signed   By: Lajean Manes M.D.   On: 11/09/2020 14:12     Assessment/Plan Principal Problem:   Syncope Active Problems:   COPD (chronic obstructive pulmonary disease) with emphysema (HCC)   Cigarette smoker   Underweight   Gastric adenocarcinoma (HCC)   Chronic hypoxemic respiratory failure (HCC)   Syncopal episode: Reportedly, after he finished infusion vitamin B12 and iron and about to leave the cancer center, last.  He was  found to be, suspected to to have respiratory distress.  CODE BLUE was called but did not report of chest compression or initiation of ACLS protocol Syncopal episode most likely  associated with dehydration.  He was hypotensive on presentation to the emergency department.  Lactic acid was elevated.  He was in sinus tachycardia. Continue IV fluids. Will check echocardiogram, carotid Doppler, orthostatic vitals.  PT evaluation requested. CT head did not show any acute intracranial abnormalities.  COPD: Currently not in exacerbation.  Chest x-ray showed features of COPD, no evidence of pneumonia.  On 4 L of oxygen at baseline at home.  Uses inhaler, nebulizer at home.  Follows with pulmonology as an outpatient.  Elevated D-dimer: CT angiogram did not show any PE.  Tobacco abuse: Patient continues to smoke.  Counseled for cessation.  History of vitamin B-12 insufficiency/iron deficiency anemia: Takes vitamin B12 and iron infusion at the cancer center likely associated with partial gastrectomy.[name] with oncology  History of gastric cancer: Currently in  remission.  Diagnosed in 05/2017.  Treated with neoadjuvant chemo and surgery.  Currently on surveillance.  Severe protein calorie malnutrition: We will request for dietitian consultation  Generalized weakness: As per the daughter, he is not that ambulatory at home.  He is very weak and mostly stays on bed.  Will request for PT evaluation  History of depression: On mirtazapine    Severity of Illness: The appropriate patient status for this patient is OBSERVATION.    DVT prophylaxis: Lovenox Code Status: Full Family Communication: Daughter at bedside Consults called: None     Shelly Coss MD [facility]  11/09/2020, 5:40 PM

## 2020-11-09 NOTE — ED Triage Notes (Signed)
Pt brought over from [facility] on stretcher with c/o respiratory distress. Pt appeared pale and dusky in color on arrival but maintaining airway.

## 2020-11-09 NOTE — Progress Notes (Signed)
RT attempted to get an ABG with no success. RT notified the doctor

## 2020-11-09 NOTE — ED Notes (Signed)
Patient transported to CT 

## 2020-11-09 NOTE — Progress Notes (Signed)
Upon completion of iron infusion patient refusing to wait 30 minute post observation period. Very anxious to leave. Volunteer staff requested to wheel patient outside on 3L of O2. Patient turned off O2 and removed nasal canula, refusing to use it upon going outside. Patient wheeled outside by Engineer, manufacturing. Attempted to call daughter but no answer and voicemail not set up.

## 2020-11-10 ENCOUNTER — Observation Stay (HOSPITAL_BASED_OUTPATIENT_CLINIC_OR_DEPARTMENT_OTHER): Payer: Medicare Other

## 2020-11-10 DIAGNOSIS — R55 Syncope and collapse: Secondary | ICD-10-CM | POA: Diagnosis not present

## 2020-11-10 LAB — BASIC METABOLIC PANEL
Anion gap: 4 — ABNORMAL LOW (ref 5–15)
BUN: 10 mg/dL (ref 8–23)
CO2: 30 mmol/L (ref 22–32)
Calcium: 7.4 mg/dL — ABNORMAL LOW (ref 8.9–10.3)
Chloride: 107 mmol/L (ref 98–111)
Creatinine, Ser: 0.4 mg/dL — ABNORMAL LOW (ref 0.61–1.24)
GFR, Estimated: >60 mL/min (ref >60)
Glucose, Bld: 72 mg/dL (ref 70–99)
Potassium: 4.2 mmol/L (ref 3.5–5.1)
Sodium: 141 mmol/L (ref 135–145)

## 2020-11-10 LAB — CBC
HCT: 35 % — ABNORMAL LOW (ref 39.0–52.0)
Hemoglobin: 10.8 g/dL — ABNORMAL LOW (ref 13.0–17.0)
MCH: 29 pg (ref 26.0–34.0)
MCHC: 30.9 g/dL (ref 30.0–36.0)
MCV: 94.1 fL (ref 80.0–100.0)
Platelets: 123 10*3/uL — ABNORMAL LOW (ref 150–400)
RBC: 3.72 MIL/uL — ABNORMAL LOW (ref 4.22–5.81)
RDW: 13.3 % (ref 11.5–15.5)
WBC: 10.4 10*3/uL (ref 4.0–10.5)
nRBC: 0 % (ref 0.0–0.2)

## 2020-11-10 LAB — ECHOCARDIOGRAM COMPLETE
Area-P 1/2: 2.68 cm2
Height: 65 in
S' Lateral: 2.62 cm
Weight: 1552.04 oz

## 2020-11-10 MED ORDER — MIDODRINE HCL 5 MG PO TABS
5.0000 mg | ORAL_TABLET | Freq: Three times a day (TID) | ORAL | Status: DC
  Administered 2020-11-10: 5 mg via ORAL
  Filled 2020-11-10: qty 1

## 2020-11-10 MED ORDER — HEPARIN SOD (PORK) LOCK FLUSH 100 UNIT/ML IV SOLN
500.0000 [IU] | INTRAVENOUS | Status: AC | PRN
Start: 2020-11-10 — End: 2020-11-10
  Administered 2020-11-10: 500 [IU]
  Filled 2020-11-10: qty 5

## 2020-11-10 MED ORDER — CHLORHEXIDINE GLUCONATE CLOTH 2 % EX PADS
6.0000 | MEDICATED_PAD | Freq: Every day | CUTANEOUS | Status: DC
  Administered 2020-11-10: 6 via TOPICAL

## 2020-11-10 MED ORDER — MIDODRINE HCL 5 MG PO TABS: 5.0000 mg | ORAL_TABLET | Freq: Three times a day (TID) | ORAL | 0 refills | Status: DC

## 2020-11-10 NOTE — Discharge Instructions (Signed)
COPD and Physical Activity Chronic obstructive pulmonary disease (COPD) is a long-term (chronic) condition that affects the lungs. COPD is a general term that can be used to describe many different lung problems that cause lung swelling (inflammation) and limit airflow, including chronic bronchitis and emphysema. The main symptom of COPD is shortness of breath, which makes it harder to do even simple tasks. This can also make it harder to exercise and be active. Talk with your health care provider about treatments to help you breathe better and actions you can take to prevent breathing problems during physical activity. What are the benefits of exercising with COPD? Exercising regularly is an important part of a healthy lifestyle. You can still exercise and do physical activities even though you have COPD. Exercise and physical activity improve your shortness of breath by increasing blood flow (circulation). This causes your heart to pump more oxygen through your body. Moderate exercise can improve your:  Oxygen use.  Energy level.  Shortness of breath.  Strength in your breathing muscles.  Heart health.  Sleep.  Self-esteem and feelings of self-worth.  Depression, stress, and anxiety levels. Exercise can benefit everyone with COPD. The severity of your disease may affect how hard you can exercise, especially at first, but everyone can benefit. Talk with your health care provider about how much exercise is safe for you, and which activities and exercises are safe for you.   What actions can I take to prevent breathing problems during physical activity?  Sign up for a pulmonary rehabilitation program. This type of program may include: ? Education about lung diseases. ? Exercise classes that teach you how to exercise and be more active while improving your breathing. This usually involves:  Exercise using your lower extremities, such as a stationary bicycle.  About 30 minutes of exercise,  2 to 5 times per week, for 6 to 12 weeks  Strength training, such as push ups or leg lifts. ? Nutrition education. ? Group classes in which you can talk with others who also have COPD and learn ways to manage stress.  If you use an oxygen tank, you should use it while you exercise. Work with your health care provider to adjust your oxygen for your physical activity. Your resting flow rate is different from your flow rate during physical activity.  While you are exercising: ? Take slow breaths. ? Pace yourself and do not try to go too fast. ? Purse your lips while breathing out. Pursing your lips is similar to a kissing or whistling position. ? If doing exercise that uses a quick burst of effort, such as weight lifting:  Breathe in before starting the exercise.  Breathe out during the hardest part of the exercise (such as raising the weights). Where to find support You can find support for exercising with COPD from:  Your health care provider.  A pulmonary rehabilitation program.  Your local health department or community health programs.  Support groups, online or in-person. Your health care provider may be able to recommend support groups. Where to find more information You can find more information about exercising with COPD from:  American Lung Association: lung.org.  COPD Foundation: copdfoundation.org. Contact a health care provider if:  Your symptoms get worse.  You have chest pain.  You have nausea.  You have a fever.  You have trouble talking or catching your breath.  You want to start a new exercise program or a new activity. Summary  COPD is a general term   that can be used to describe many different lung problems that cause lung swelling (inflammation) and limit airflow. This includes chronic bronchitis and emphysema.  Exercise and physical activity improve your shortness of breath by increasing blood flow (circulation). This causes your heart to provide  more oxygen to your body.  Contact your health care provider before starting any exercise program or new activity. Ask your health care provider what exercises and activities are safe for you. This information is not intended to replace advice given to you by your health care provider. Make sure you discuss any questions you have with your health care provider. Document Revised: 10/16/2018 Document Reviewed: 07/19/2017 Elsevier Patient Education  2021 [place].   Chronic Obstructive Pulmonary Disease Exacerbation  Chronic obstructive pulmonary disease (COPD) is a long-term (chronic) condition that affects the lungs. COPD is a general term that can be used to describe many different lung problems that cause lung swelling (inflammation) and limit airflow, including chronic bronchitis and emphysema. COPD exacerbations are episodes when breathing symptoms become much worse and require extra treatment. COPD exacerbations are usually caused by infections. Without treatment, COPD exacerbations can be severe and even life threatening. Frequent COPD exacerbations can cause further damage to the lungs. What are the causes? This condition may be caused by:  Respiratory infections, including viral and bacterial infections.  Exposure to smoke.  Exposure to air pollution, chemical fumes, or dust.  Things that give you an allergic reaction (allergens).  Not taking your usual COPD medicines as directed.  Underlying medical problems, such as congestive heart failure or infections not involving the lungs. In many cases, the cause (trigger) of this condition is not known. What increases the risk? The following factors may make you more likely to develop this condition:  Smoking cigarettes.  Old age.  Frequent prior COPD exacerbations. What are the signs or symptoms? Symptoms of this condition include:  Increased coughing.  Increased production of mucus from your lungs (sputum).  Increased  wheezing.  Increased shortness of breath.  Rapid or labored breathing.  Chest tightness.  Less energy than usual.  Sleep disruption from symptoms.  Confusion or increased sleepiness. Often these symptoms happen or get worse even with the use of medicines. How is this diagnosed? This condition is diagnosed based on:  Your medical history.  A physical exam. You may also have tests, including:  A chest X-ray.  Blood tests.  Lung (pulmonary) function tests. How is this treated? Treatment for this condition depends on the severity and cause of the symptoms. You may need to be admitted to a hospital for treatment. Some of the treatments commonly used to treat COPD exacerbations are:  Antibiotic medicines. These may be used for severe exacerbations caused by a lung infection, such as pneumonia.  Bronchodilators. These are inhaled medicines that expand the air passages and allow increased airflow.  Steroid medicines. These act to reduce inflammation in the airways. They may be given with an inhaler, taken by mouth, or given through an IV tube inserted into one of your veins.  Supplemental oxygen therapy.  Airway clearing techniques, such as noninvasive ventilation (NIV) and positive expiratory pressure (PEP). These provide respiratory support through a mask or other noninvasive device. An example of this would be using a continuous positive airway pressure (CPAP) machine to improve delivery of oxygen into your lungs. Follow these instructions at home: Medicines  Take over-the-counter and prescription medicines only as told by your health care provider. It is important to use  correct technique with inhaled medicines.  If you were prescribed an antibiotic medicine or oral steroid, take it as told by your health care provider. Do not stop taking the medicine even if you start to feel better. Lifestyle  Eat a healthy diet.  Exercise regularly.  Get plenty of sleep.  Avoid  exposure to all substances that irritate the airway, especially to tobacco smoke.  Wash your hands often with soap and water to reduce the risk of infection. If soap and water are not available, use hand sanitizer.  During flu season, avoid enclosed spaces that are crowded with people. General instructions  Drink enough fluid to keep your urine clear or pale yellow (unless you have a medical condition that requires fluid restriction).  Use a cool mist vaporizer. This humidifies the air and makes it easier for you to clear your chest when you cough.  If you have a home nebulizer and oxygen, continue to use them as told by your health care provider.  Keep all follow-up visits as told by your health care provider. This is important. How is this prevented?  Stay up-to-date on pneumococcal and influenza (flu) vaccines. A flu shot is recommended every year to help prevent exacerbations.  Do not use any products that contain nicotine or tobacco, such as cigarettes and e-cigarettes. Quitting smoking is very important in preventing COPD from getting worse and in preventing exacerbations from happening as often. If you need help quitting, ask your health care provider.  Follow all instructions for pulmonary rehabilitation after a recent exacerbation. This can help prevent future exacerbations.  Work with your health care provider to develop and follow an action plan. This tells you what steps to take when you experience certain symptoms. Contact a health care provider if:  You have a worsening of your regular COPD symptoms. Get help right away if:  You have worsening shortness of breath, even when resting.  You have trouble talking.  You have severe chest pain.  You cough up blood.  You have a fever.  You have weakness, vomit repeatedly, or faint.  You feel confused.  You are not able to sleep because of your symptoms.  You have trouble doing daily activities. Summary  COPD  exacerbations are episodes when breathing symptoms become much worse and require extra treatment above your normal treatment.  Exacerbations can be severe and even life threatening. Frequent COPD exacerbations can cause further damage to your lungs.  COPD exacerbations are usually triggered by infections such as the flu, colds, and even pneumonia.  Treatment for this condition depends on the severity and cause of the symptoms. You may need to be admitted to a hospital for treatment.  Quitting smoking is very important to prevent COPD from getting worse and to prevent exacerbations from happening as often. This information is not intended to replace advice given to you by your health care provider. Make sure you discuss any questions you have with your health care provider. Document Revised: 06/08/2017 Document Reviewed: 07/31/2016 Elsevier Patient Education  2021 [employer].

## 2020-11-10 NOTE — Plan of Care (Signed)

## 2020-11-10 NOTE — Progress Notes (Signed)
patient states he brought his iphone to the ER last night. This RN called the ER and security and they said they have not seen any missing iphone.   This RN has made the Manager Bobby Caldwell and Actor aware.  Manager Bobby Caldwell states she will call Risk management to find patient's phone.   Patient's daughter Bobby Caldwell made aware.    Pt states he turned his phone off in ER and the daughter put it under his pillow.   The daughter describes his phone as a red iphone and black case.   Manager Bobby Caldwell will follow up.   Patient currently discharged, IV removed NAD, on 3-4L.   NT packed patient belonging (belongings included: wallet, cigarettes, lighter,)  AVS explained and given to patient. No questions and concerns at this time.

## 2020-11-10 NOTE — Progress Notes (Signed)
Carotid artery duplex has been completed. Preliminary results can be found in CV Proc through chart review.   11/10/20 9:59 AM Bobby Caldwell RVT

## 2020-11-10 NOTE — Patient Care Conference (Deleted)
[place]-- Daughter

## 2020-11-10 NOTE — Care Management Obs Status (Signed)
[place] NOTIFICATION   Patient Details  Name: Bobby Caldwell MRN: [id number] Date of Birth: 04-Dec-1955   Medicare Observation Status Notification Given:  Yes    Dessa Phi, RN 11/10/2020, 12:21 PM

## 2020-11-10 NOTE — Progress Notes (Signed)
Call out to RT per patient request for a Duo Neb treatment. Spoke with RT (Angie), she will be up to administer neb. Day shift RN and Patient updated.

## 2020-11-10 NOTE — Evaluation (Signed)
Physical Therapy Evaluation Patient Details Name: Bobby Caldwell MRN: [id number] DOB: Jun 08, 1956 Today's Date: 11/10/2020   History of Present Illness  65 yo male admitted with syncope + collapse. Hx of advanced COPD-O2 dep, gastric Ca, COVID  Clinical Impression  On eval, pt was Supv for mobility. He walked ~75 feet with and without UE support. Pt tolerated activity well. O2 96% on O2 during session. Pt denied dizziness. Orthostatic BP readings are in vitals section. Do not anticipate any f/u PT needs after discharge.     Follow Up Recommendations No PT follow up;Supervision - Intermittent    Equipment Recommendations  None recommended by PT    Recommendations for Other Services       Precautions / Restrictions Precautions Precautions: Fall Precaution Comments: O2 dep. Restrictions Weight Bearing Restrictions: No      Mobility  Bed Mobility Overal bed mobility: Modified Independent                  Transfers Overall transfer level: Modified independent                  Ambulation/Gait Ambulation/Gait assistance: Supervision Gait Distance (Feet): 75 Feet Assistive device: IV Pole;None Gait Pattern/deviations: Step-through pattern;Decreased stride length     General Gait Details: O2 96% on 3L. Pt denied dizziness. Tolerated distance well  Stairs            Wheelchair Mobility    Modified Rankin (Stroke Patients Only)       Balance Overall balance assessment: Mild deficits observed, not formally tested                                           Pertinent Vitals/Pain Pain Assessment: Faces Faces Pain Scale: No hurt    Home Living Family/patient expects to be discharged to:: Private residence Living Arrangements: Spouse/significant other;Children Available Help at Discharge: Family Type of Home: House       Home Layout: One level        Prior Function Level of Independence: Needs assistance   Gait / Transfers  Assistance Needed: no device per pt. per chart, minimal ambulation at baseline  ADL's / Homemaking Assistance Needed: family assists as needed        Hand Dominance        Extremity/Trunk Assessment   Upper Extremity Assessment Upper Extremity Assessment: Overall WFL for tasks assessed    Lower Extremity Assessment Lower Extremity Assessment: Generalized weakness    Cervical / Trunk Assessment Cervical / Trunk Assessment: Normal  Communication   Communication: Prefers language other than English  Cognition Arousal/Alertness: Awake/alert Behavior During Therapy: WFL for tasks assessed/performed Overall Cognitive Status: Within Functional Limits for tasks assessed                                 General Comments: understands and speak some English      General Comments      Exercises     Assessment/Plan    PT Assessment Patient needs continued PT services  PT Problem List Decreased mobility;Decreased activity tolerance;Decreased strength       PT Treatment Interventions Gait training;Therapeutic exercise;Balance training;Functional mobility training;Therapeutic activities;Patient/family education    PT Goals (Current goals can be found in the Care Plan section)  Acute Rehab PT Goals Patient Stated Goal: home! PT Goal  Formulation: With patient Time For Goal Achievement: 11/24/20 Potential to Achieve Goals: Good    Frequency Min 3X/week   Barriers to discharge        Co-evaluation               AM-PAC PT "6 Clicks" Mobility  Outcome Measure Help needed turning from your back to your side while in a flat bed without using bedrails?: None Help needed moving from lying on your back to sitting on the side of a flat bed without using bedrails?: None Help needed moving to and from a bed to a chair (including a wheelchair)?: None Help needed standing up from a chair using your arms (e.g., wheelchair or bedside chair)?: None Help needed to  walk in hospital room?: A Little Help needed climbing 3-5 steps with a railing? : A Little 6 Click Score: 22    End of Session Equipment Utilized During Treatment: Gait belt;Oxygen Activity Tolerance: Patient tolerated treatment well Patient left: in bed;with call bell/phone within reach;with bed alarm set   PT Visit Diagnosis: Difficulty in walking, not elsewhere classified (R26.2)    Time: 2683-4196 PT Time Calculation (min) (ACUTE ONLY): 20 min   Charges:   PT Evaluation $PT Eval Moderate Complexity: [place], PT Acute Rehabilitation  Office: [phone] Pager: [phone]

## 2020-11-10 NOTE — TOC Transition Note (Signed)
Transition of Care [facility]) - CM/SW Discharge Note   Patient Details  Name: Bobby Caldwell MRN: [id number] Date of Birth: 04/23/1956  [facility] CM/SW Contact:  Dessa Phi, RN Phone Number: 11/10/2020, 12:29 PM   Clinical Narrative: d/c home today per dtr Dorris Carnes, & dtr Mattie-uses [employer] for home 02-has travel tank already.Family will transport home on own. No further CM needs.      Final next level of care: Home/Self Care Barriers to Discharge: No Barriers Identified   Patient Goals and CMS Choice Patient states their goals for this hospitalization and ongoing recovery are:: go home CMS Medicare.gov Compare Post Acute Care list provided to:: Patient Represenative (must comment) (Samir dtr [id number]) Choice offered to / list presented to : Adult Children  Discharge Placement                       Discharge Plan and Services   Discharge Planning Services: CM Consult Post Acute Care Choice: NA                               Social Determinants of Health (SDOH) Interventions     Readmission Risk Interventions Readmission Risk Prevention Plan 10/15/2019  Transportation Screening Complete  PCP or Specialist Appt within 3-5 Days Complete  HRI or [place] Complete  Social Work Consult for [place] Planning/Counseling Complete  Palliative Care Screening Not Applicable  Medication Review [employer]) Complete  Some recent data might be hidden

## 2020-11-10 NOTE — Progress Notes (Signed)
[phone] Bobby Caldwell-- Daughter 

## 2020-11-10 NOTE — Progress Notes (Signed)
  Echocardiogram 2D Echocardiogram has been performed.  Jennette Dubin 11/10/2020, 10:10 AM

## 2020-11-10 NOTE — Discharge Summary (Signed)
Physician Discharge Summary  Bobby Caldwell [id number] DOB: [birth year] DOA: 11/09/2020  PCP: Bobby Merle, MD  Admit date: 11/09/2020 Discharge date: 11/10/2020  Admitted From: Home Disposition:  Home  Discharge Condition:Stable CODE STATUS:FULL Diet recommendation: Heart Healthy   Brief/Interim Summary: Bobby Caldwell is a 65 y.o. 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  He was admitted for syncopal work-up.  He was found to be severely dehydrated, hypotensive on presentation.  He was given plenty of fluids.  Blood pressure still remains soft but stable.  He has been started on midodrine.  He was seen by PT, no follow-up recommended.  He was not orthostatic this morning.  Echocardiogram showed normal left ventricular ejection fraction, no valvular abnormalities.  Patient is adamant about going home, does not want to stay another night.  He has been discharged.  Following problems were addressed during his hospitalization:   Syncopal episode: Reportedly, after he finished infusion vitamin B12 and iron and about to leave the cancer center, last.  He was found to be, suspected to to have respiratory distress.  CODE BLUE was called but did not report of chest compression or initiation of ACLS protocol Syncopal episode most likely  associated with dehydration.  He was hypotensive on presentation to the emergency department.  Lactic acid was elevated,later resolved .  He was in sinus tachycardia, no heart rate is stable. Given  IV fluids. CT head did not show any acute intracranial abnormalities. Blood pressure still remains soft but stable.  He has been started on midodrine.  He was seen by PT, no follow-up recommended.  He was not  orthostatic this morning.  Echocardiogram showed normal left ventricular ejection fraction, no valvular abnormalities.   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  COPD: Currently not in exacerbation.  Chest x-ray showed features of COPD, no evidence of pneumonia.  On 4 L of oxygen at baseline at home.  Uses inhaler, nebulizer at home.  Follows with pulmonology as an outpatient.  Elevated D-dimer: CT angiogram did not show any PE.  Tobacco abuse: Patient continues to smoke.  Counseled for cessation.  History of vitamin B-12 insufficiency/iron deficiency anemia: Takes vitamin B12 and iron infusion at the cancer center likely associated with partial gastrectomy.Bobby Caldwell with oncology  History of gastric cancer: Currently in remission.  Diagnosed in 05/2017.  Treated with neoadjuvant chemo and surgery.  Currently on surveillance.  Severe protein calorie malnutrition: Requested  dietitian consultation  Generalized weakness: As per the daughter, he is not that ambulatory at home.  He is very weak and mostly stays on bed.  PT evaluation done, no follow-up recommended     Discharge Diagnoses:  Principal Problem:   Syncope Active Problems:   COPD (chronic obstructive pulmonary disease) with emphysema (HCC)   Cigarette smoker   Underweight   Gastric adenocarcinoma (HCC)   Chronic hypoxemic respiratory failure (HCC)    Discharge Instructions  Discharge Instructions    Diet - low sodium heart healthy   Complete by: As directed    Discharge instructions   Complete by: As directed    1)Please follow up with your PCP in a week. 2)Take prescribed medication as  instructed   Increase activity slowly   Complete by: As directed      Allergies as of 11/10/2020   No Known Allergies     Medication List    TAKE these medications   Breztri Aerosphere 160-9-4.8 MCG/ACT Aero Generic drug:  Budeson-Glycopyrrol-Formoterol Inhale 2 puffs into the lungs 2 (two) times daily.   feeding supplement Liqd Take 237 mLs by mouth 2 (two) times daily between meals.   ipratropium-albuterol 0.5-2.5 (3) MG/3ML Soln Commonly known as: DUONEB Take 3 mLs by nebulization every 4 (four) hours as needed. Dx J44.9 What changed: reasons to take this   midodrine 5 MG tablet Commonly known as: PROAMATINE Take 1 tablet (5 mg total) by mouth 3 (three) times daily with meals.   mirtazapine 7.5 MG tablet Commonly known as: REMERON Take 1 tablet (7.5 mg total) by mouth at bedtime. What changed:   when to take this  reasons to take this  additional instructions   multivitamin with minerals Tabs tablet Take 1 tablet by mouth daily.   ondansetron 4 MG tablet Commonly known as: Zofran Take 1 tablet (4 mg total) by mouth every 8 (eight) hours as needed for nausea or vomiting.   OXYGEN Inhale 2 L into the lungs continuous.       Follow-up Information    Bobby Merle, MD. Schedule an appointment as soon as possible for a visit in 1 week(s).   Specialties: Hematology, Oncology Contact information: [place] [address] [phone]              No Known Allergies  Consultations:  None   Procedures/Studies: CT Head Wo Contrast  Result Date: 11/09/2020 CLINICAL DATA:  Altered mental status EXAM: CT HEAD WITHOUT CONTRAST TECHNIQUE: Contiguous axial images were obtained from the base of the skull through the vertex without intravenous contrast. COMPARISON:  October 12, 2019 FINDINGS: Brain: Mild diffuse atrophy is stable. There is no intracranial mass, hemorrhage, extra-axial fluid collection, or midline shift. The brain parenchyma appears unremarkable. No appreciable acute infarct. Vascular: No hyperdense vessel. Calcification noted in each carotid siphon region. Skull: Bony calvarium appears intact. Sinuses/Orbits: Visualized paranasal sinuses are clear. Orbits  appear symmetric bilaterally. There is leftward deviation of the nasal septum. Other: Mastoid air cells clear. IMPRESSION: Mild atrophy, stable. No mass or hemorrhage. Brain parenchyma unremarkable in appearance. Foci of arterial vascular calcification noted. Deviated nasal septum. Electronically Signed   By: Lowella Grip III M.D.   On: 11/09/2020 16:29   CT Angio Chest PE W/Cm &/Or Wo Cm  Result Date: 11/09/2020 CLINICAL DATA:  Shortness of breath with elevated D-dimer study EXAM: CT ANGIOGRAPHY CHEST WITH CONTRAST TECHNIQUE: Multidetector CT imaging of the chest was performed using the standard protocol during bolus administration of intravenous contrast. Multiplanar CT image reconstructions and MIPs were obtained to evaluate the vascular anatomy. CONTRAST:  32mL OMNIPAQUE IOHEXOL 350 MG/ML SOLN COMPARISON:  Chest radiograph Nov 09, 2020; chest CT April 01, 2020 FINDINGS: Cardiovascular: No evident pulmonary embolus. No thoracic aortic aneurysm or dissection. Minimal scattered calcification in visualized great vessels. There are foci of aortic atherosclerosis. No appreciable pericardial effusion or pericardial thickening noted. Port-A-Cath tip in superior vena cava. Mediastinum/Nodes: Thyroid appears unremarkable. No evident thoracic adenopathy. No esophageal lesions are appreciable. Lungs/Pleura: There is underlying centrilobular emphysematous change. There is mild atelectatic change in the lung bases. No edema or airspace opacity. On axial slice 30 series 14, there is a stable 4 mm nodular opacity in the left  upper lobe toward the apex. On axial slice [id number] series 14, there is a 5 mm nodular opacity in the right middle lobe medially. There is a 4 mm nodular opacity in the anterior segment of the left lower lobe, a likely intrafissural lymph node, stable. There is a small granuloma in the right upper lobe. No new nodular opacities are evident. No pleural effusions. Upper Abdomen: Visualized upper  abdominal structures appear unremarkable. Musculoskeletal: No blastic or lytic bone lesions. There is a port on the right anteriorly. Review of the MIP images confirms the above findings. IMPRESSION: 1. No evident pulmonary embolus. No thoracic aortic aneurysm or dissection. Foci of aortic atherosclerosis noted. Port-A-Cath present. 2. Emphysematous change. No edema or airspace opacity. Scattered pulmonary nodular opacities appear stable. No pleural effusions. 3.  No evident adenopathy. Aortic Atherosclerosis (ICD10-I70.0) and Emphysema (ICD10-J43.9). Electronically Signed   By: Lowella Grip III M.D.   On: 11/09/2020 16:40   DG Chest Port 1 View  Result Date: 11/09/2020 CLINICAL DATA:  Dyspnea. Patient underwent an iron transfusion today. History of stomach cancer EXAM: PORTABLE CHEST 1 VIEW COMPARISON:  11/18/2019 and older studies. FINDINGS: Cardiac silhouette is normal in size. Mediastinal or hilar masses. Right anterior chest wall Port-A-Cath is stable, tip in the lower superior vena cava. Lungs are markedly hyperexpanded, but clear. No pleural effusion or pneumothorax. Skeletal structures are grossly intact. IMPRESSION: 1. No acute cardiopulmonary disease. 2. Advanced COPD. Electronically Signed   By: Lajean Manes M.D.   On: 11/09/2020 14:12   ECHOCARDIOGRAM COMPLETE  Result Date: 11/10/2020    ECHOCARDIOGRAM REPORT   Patient Name:   MALEK NEBEL Date of Exam: 11/10/2020 Medical Rec #:  [id number]   Height:       65.0 in Accession #:    [id number]  Weight:       97.0 lb Date of Birth:  [birth year]   BSA:          1.454 m Patient Age:    47 years    BP:           93/66 mmHg Patient Gender: M           HR:           91 bpm. Exam Location:  Inpatient Procedure: 2D Echo Indications:    Syncope R55  History:        Patient has prior history of Echocardiogram examinations, most                 recent 09/11/2019. COPD, Signs/Symptoms:Dyspnea; Risk                 Factors:Current Smoker.  Sonographer:    Mikki Santee RDCS (AE) Referring Phys: [id number] Ileana Chalupa IMPRESSIONS  1. Left ventricular ejection fraction, by estimation, is 60 to 65%. The left ventricle has normal function. The left ventricle has no regional wall motion abnormalities. Left ventricular diastolic parameters were normal.  2. Right ventricular systolic function is normal. The right ventricular size is normal.  3. The mitral valve is normal in structure. Trivial mitral valve regurgitation. No evidence of mitral stenosis.  4. The aortic valve is tricuspid. There is mild calcification of the aortic valve. Aortic valve regurgitation is not visualized. Mild aortic valve sclerosis is present, with no evidence of aortic valve stenosis.  5. The inferior vena cava is dilated in size with >50% respiratory variability, suggesting right atrial pressure of 8 mmHg. FINDINGS  Left Ventricle: Left ventricular ejection fraction, by estimation,  is 60 to 65%. The left ventricle has normal function. The left ventricle has no regional wall motion abnormalities. The left ventricular internal cavity size was normal in size. There is  no left ventricular hypertrophy. Left ventricular diastolic parameters were normal. Right Ventricle: The right ventricular size is normal. No increase in right ventricular wall thickness. Right ventricular systolic function is normal. Left Atrium: Left atrial size was normal in size. Right Atrium: Right atrial size was normal in size. Pericardium: There is no evidence of pericardial effusion. Mitral Valve: The mitral valve is normal in structure. There is mild thickening of the mitral valve leaflet(s). Trivial mitral valve regurgitation. No evidence of mitral valve stenosis. Tricuspid Valve: The tricuspid valve is normal in structure. Tricuspid valve regurgitation is trivial. No evidence of tricuspid stenosis. Aortic Valve: The aortic valve is tricuspid. There is mild calcification of the aortic valve. Aortic valve regurgitation is not  visualized. Mild aortic valve sclerosis is present, with no evidence of aortic valve stenosis. Pulmonic Valve: The pulmonic valve was normal in structure. Pulmonic valve regurgitation is not visualized. No evidence of pulmonic stenosis. Aorta: The aortic root is normal in size and structure. Venous: The inferior vena cava is dilated in size with greater than 50% respiratory variability, suggesting right atrial pressure of 8 mmHg. IAS/Shunts: No atrial level shunt detected by color flow Doppler.  LEFT VENTRICLE PLAX 2D LVIDd:         3.90 cm  Diastology LVIDs:         2.62 cm  LV e' medial:    9.14 cm/s LV PW:         0.77 cm  LV E/e' medial:  8.4 LV IVS:        0.57 cm  LV e' lateral:   10.40 cm/s LVOT diam:     2.00 cm  LV E/e' lateral: 7.4 LV SV:         63 LV SV Index:   43 LVOT Area:     3.14 cm  RIGHT VENTRICLE RV S prime:     14.60 cm/s TAPSE (M-mode): 1.9 cm LEFT ATRIUM         Index LA diam:    1.80 cm 1.24 cm/m  AORTIC VALVE LVOT Vmax:   92.40 cm/s LVOT Vmean:  62.600 cm/s LVOT VTI:    0.201 m  AORTA Ao Root diam: 2.50 cm MITRAL VALVE MV Area (PHT): 2.68 cm    SHUNTS MV Decel Time: 283 msec    Systemic VTI:  0.20 m MV E velocity: 76.60 cm/s  Systemic Diam: 2.00 cm MV A velocity: 71.30 cm/s MV E/A ratio:  1.07 Jenkins Rouge MD Electronically signed by Jenkins Rouge MD Signature Date/Time: 11/10/2020/10:43:09 AM    Final    VAS US CAROTID  Result Date: 11/10/2020 Carotid Arterial Duplex Study Patient Name:  RION CATALA  Date of Exam:   11/10/2020 Medical Rec #: [id number]    Accession #:    [id number] Date of Birth: [birth year]    Patient Gender: M Patient Age:   64Y Exam Location:  [facility] Procedure:      VAS US CAROTID Referring Phys: [id number] Lyra Alaimo --------------------------------------------------------------------------------  Indications:       Syncope. Risk Factors:      Hypertension. Limitations        Today's exam was limited due to the body habitus of the  patient. Comparison Study:  No prior studies. Performing Technologist: Oliver Hum RVT  Examination Guidelines: A complete evaluation includes B-mode imaging, spectral Doppler, color Doppler, and power Doppler as needed of all accessible portions of each vessel. Bilateral testing is considered an integral part of a complete examination. Limited examinations for reoccurring indications may be performed as noted.  Right Carotid Findings: +----------+--------+--------+--------+-----------------------+--------+           PSV cm/sEDV cm/sStenosisPlaque Description     Comments +----------+--------+--------+--------+-----------------------+--------+ CCA Prox  101     42              smooth and heterogenous         +----------+--------+--------+--------+-----------------------+--------+ CCA Distal86      30              smooth and heterogenous         +----------+--------+--------+--------+-----------------------+--------+ ICA Prox  140     56      40-59%  smooth and heterogenous         +----------+--------+--------+--------+-----------------------+--------+ ICA Mid   111     56      40-59%                                  +----------+--------+--------+--------+-----------------------+--------+ ICA Distal119     53      40-59%                                  +----------+--------+--------+--------+-----------------------+--------+ ECA       70      10                                              +----------+--------+--------+--------+-----------------------+--------+ +----------+--------+-------+--------+-------------------+           PSV cm/sEDV cmsDescribeArm Pressure (mmHG) +----------+--------+-------+--------+-------------------+ [id number]                                         +----------+--------+-------+--------+-------------------+ +---------+--------+--+--------+--+---------+ VertebralPSV cm/s59EDV cm/s12Antegrade  +---------+--------+--+--------+--+---------+  Left Carotid Findings: +----------+--------+--------+--------+-----------------------+--------+           PSV cm/sEDV cm/sStenosisPlaque Description     Comments +----------+--------+--------+--------+-----------------------+--------+ CCA Prox  122     14              smooth and heterogenous         +----------+--------+--------+--------+-----------------------+--------+ CCA Distal72      8               smooth and heterogenous         +----------+--------+--------+--------+-----------------------+--------+ ICA Prox  126     22              smooth and heterogenous         +----------+--------+--------+--------+-----------------------+--------+ ICA Distal108     18                                              +----------+--------+--------+--------+-----------------------+--------+ ECA       60      8                                               +----------+--------+--------+--------+-----------------------+--------+ +----------+--------+--------+--------+-------------------+  PSV cm/sEDV cm/sDescribeArm Pressure (mmHG) +----------+--------+--------+--------+-------------------+ [id number]                                         +----------+--------+--------+--------+-------------------+ +---------+--------+--+--------+--+---------+ VertebralPSV cm/s44EDV cm/s16Antegrade +---------+--------+--+--------+--+---------+   Summary: Right Carotid: Velocities in the right ICA are consistent with a 40-59%                stenosis. Left Carotid: Velocities in the left ICA are consistent with a 1-39% stenosis. Vertebrals: Bilateral vertebral arteries demonstrate antegrade flow. *See table(s) above for measurements and observations.     Preliminary        Subjective:  Patient seen and examined at the bedside this morning.  Hemodynamically stable for discharge today. Discharge Exam: Vitals:   11/10/20  0739 11/10/20 1302  BP:  (!) 100/58  Pulse:    Resp:    Temp:    SpO2: 98%    Vitals:   11/09/20 2352 11/10/20 0442 11/10/20 0739 11/10/20 1302  BP: (!) 96/56 93/66  (!) 100/58  Pulse: 92 91    Resp: 18 16    Temp: 98.6 F (37 C) 98.3 F (36.8 C)    TempSrc: Oral Oral    SpO2: 95% 99% 98%   Weight:      Height:        General: Pt is alert, awake, not in acute distress,malnourished Cardiovascular: RRR, S1/S2 +, no rubs, no gallops Respiratory: CTA bilaterally, no wheezing, no rhonchi Abdominal: Soft, NT, ND, bowel sounds + Extremities: no edema, no cyanosis    The results of significant diagnostics from this hospitalization (including imaging, microbiology, ancillary and laboratory) are listed below for reference.     Microbiology: Recent Results (from the past 240 hour(s))  Blood culture (routine x 2)     Status: None (Preliminary result)   Collection Time: 11/09/20  2:24 PM   Specimen: BLOOD  Result Value Ref Range Status   Specimen Description   Final    BLOOD PORTA CATH Performed at [facility], [address] [place], [place] [address]    Special Requests   Final    BOTTLES DRAWN AEROBIC AND ANAEROBIC Blood Culture adequate volume Performed at [facility] [address], [place], [place] [address]    Culture   Final    NO GROWTH < 24 HOURS Performed at [facility] [address] [place], [place] [address]    Report Status PENDING  Incomplete  Blood culture (routine x 2)     Status: None (Preliminary result)   Collection Time: 11/09/20  2:24 PM   Specimen: BLOOD  Result Value Ref Range Status   Specimen Description   Final    BLOOD RIGHT ANTECUBITAL Performed at [facility] [address] [place], [place] [address]    Special Requests   Final    BOTTLES DRAWN AEROBIC AND ANAEROBIC Blood Culture results may not be optimal due to an inadequate volume of blood received in culture bottles Performed  at [facility] [address] [place], [place] [address]    Culture   Final    NO GROWTH < 24 HOURS Performed at [facility] [address], [place],  [address]    Report Status PENDING  Incomplete  Resp Panel by RT-PCR (Flu A&B, Covid) Nasopharyngeal Swab     Status: None  Collection Time: 11/09/20  2:51 PM   Specimen: Nasopharyngeal Swab; Nasopharyngeal(NP) swabs in vial transport medium  Result Value Ref Range Status   SARS Coronavirus 2 by RT PCR NEGATIVE NEGATIVE Final    Comment: (NOTE) SARS-CoV-2 target nucleic acids are NOT DETECTED.  The SARS-CoV-2 RNA is generally detectable in upper respiratory specimens during the acute phase of infection. The lowest concentration of SARS-CoV-2 viral copies this assay can detect is 138 copies/mL. A negative result does not preclude SARS-Cov-2 infection and should not be used as the sole basis for treatment or other patient management decisions. A negative result may occur with  improper specimen collection/handling, submission of specimen other than nasopharyngeal swab, presence of viral mutation(s) within the areas targeted by this assay, and inadequate number of viral copies(<138 copies/mL). A negative result must be combined with clinical observations, patient history, and epidemiological information. The expected result is Negative.  Fact Sheet for Patients:  EntrepreneurPulse.com.au  Fact Sheet for Healthcare Providers:  IncredibleEmployment.be  This test is no t yet approved or cleared by the [place] FDA and  has been authorized for detection and/or diagnosis of SARS-CoV-2 by FDA under an Emergency Use Authorization (EUA). This EUA will remain  in effect (meaning this test can be used) for the duration of the COVID-19 declaration under Section 564(b)(1) of the Act, 21 U.S.C.section 360bbb-3(b)(1), unless the authorization is terminated  or revoked  sooner.       Influenza A by PCR NEGATIVE NEGATIVE Final   Influenza B by PCR NEGATIVE NEGATIVE Final    Comment: (NOTE) The Xpert Xpress SARS-CoV-2/FLU/RSV plus assay is intended as an aid in the diagnosis of influenza from Nasopharyngeal swab specimens and should not be used as a sole basis for treatment. Nasal washings and aspirates are unacceptable for Xpert Xpress SARS-CoV-2/FLU/RSV testing.  Fact Sheet for Patients: EntrepreneurPulse.com.au  Fact Sheet for Healthcare Providers: IncredibleEmployment.be  This test is not yet approved or cleared by the [place] FDA and has been authorized for detection and/or diagnosis of SARS-CoV-2 by FDA under an Emergency Use Authorization (EUA). This EUA will remain in effect (meaning this test can be used) for the duration of the COVID-19 declaration under Section 564(b)(1) of the Act, 21 U.S.C. section 360bbb-3(b)(1), unless the authorization is terminated or revoked.  Performed at [facility], [place] [address], [place], [place] [address]      Labs: BNP (last 3 results) No results for input(s): BNP in the last 8760 hours. Basic Metabolic Panel: Recent Labs  Lab 11/09/20 1300 11/10/20 0427  NA 141 141  K 4.6 4.2  CL 99 107  CO2 30 30  GLUCOSE 163* 72  BUN 12 10  CREATININE 0.44* 0.40*  CALCIUM 9.0 7.4*   Liver Function Tests: Recent Labs  Lab 11/09/20 1300  AST 17  ALT 11  ALKPHOS 71  BILITOT 0.7  PROT 6.3*  ALBUMIN 3.7   No results for input(s): LIPASE, AMYLASE in the last 168 hours. No results for input(s): AMMONIA in the last 168 hours. CBC: Recent Labs  Lab 11/09/20 1300 11/10/20 0427  WBC 12.0* 10.4  NEUTROABS 5.4  --   HGB 16.9 10.8*  HCT 54.3* 35.0*  MCV 93.3 94.1  PLT 222 123*   Cardiac Enzymes: No results for input(s): CKTOTAL, CKMB, CKMBINDEX, TROPONINI in the last 168 hours. BNP: Invalid input(s): POCBNP CBG: Recent Labs  Lab  11/09/20 1256  GLUCAP 135*   D-Dimer Recent Labs    11/09/20 1300  DDIMER 1.94*   Hgb A1c No results for input(s): HGBA1C in the last 72 hours. Lipid Profile No results for input(s): CHOL, HDL, LDLCALC, TRIG, CHOLHDL, LDLDIRECT in the last 72 hours. Thyroid function studies No results for input(s): TSH, T4TOTAL, T3FREE, THYROIDAB in the last 72 hours.  Invalid input(s): FREET3 Anemia work up No results for input(s): VITAMINB12, FOLATE, FERRITIN, TIBC, IRON, RETICCTPCT in the last 72 hours. Urinalysis    Component Value Date/Time   COLORURINE YELLOW 11/09/2020 1545   APPEARANCEUR CLEAR 11/09/2020 1545   LABSPEC 1.011 11/09/2020 1545   PHURINE 6.0 11/09/2020 1545   GLUCOSEU NEGATIVE 11/09/2020 1545   HGBUR NEGATIVE 11/09/2020 1545   [place] 11/09/2020 1545   [place] 11/09/2020 1545   PROTEINUR NEGATIVE 11/09/2020 1545   NITRITE NEGATIVE 11/09/2020 1545   LEUKOCYTESUR NEGATIVE 11/09/2020 1545   Sepsis Labs Invalid input(s): PROCALCITONIN,  WBC,  LACTICIDVEN Microbiology Recent Results (from the past 240 hour(s))  Blood culture (routine x 2)     Status: None (Preliminary result)   Collection Time: 11/09/20  2:24 PM   Specimen: BLOOD  Result Value Ref Range Status   Specimen Description   Final    BLOOD PORTA CATH Performed at [facility], [place] [address] [address], [place], [place] [address]    Special Requests   Final    BOTTLES DRAWN AEROBIC AND ANAEROBIC Blood Culture adequate volume Performed at [facility] [address], [place], [place] [address]    Culture   Final    NO GROWTH < 24 HOURS Performed at [facility] [address] [place], [place] [address]    Report Status PENDING  Incomplete  Blood culture (routine x 2)     Status: None (Preliminary result)   Collection Time: 11/09/20  2:24 PM   Specimen: BLOOD  Result Value Ref Range Status   Specimen Description   Final    BLOOD RIGHT  ANTECUBITAL Performed at [facility] [address], [place], [place] [address]    Special Requests   Final    BOTTLES DRAWN AEROBIC AND ANAEROBIC Blood Culture results may not be optimal due to an inadequate volume of blood received in culture bottles Performed at [facility] [address], [place], [place] [address]    Culture   Final    NO GROWTH < 24 HOURS Performed at [facility] [address], [place], [place] [address]    Report Status PENDING  Incomplete  Resp Panel by RT-PCR (Flu A&B, Covid) Nasopharyngeal Swab     Status: None   Collection Time: 11/09/20  2:51 PM   Specimen: Nasopharyngeal Swab; Nasopharyngeal(NP) swabs in vial transport medium  Result Value Ref Range Status   SARS Coronavirus 2 by RT PCR NEGATIVE NEGATIVE Final    Comment: (NOTE) SARS-CoV-2 target nucleic acids are NOT DETECTED.  The SARS-CoV-2 RNA is generally detectable in upper respiratory specimens during the acute phase of infection. The lowest concentration of SARS-CoV-2 viral copies this assay can detect is 138 copies/mL. A negative result does not preclude SARS-Cov-2 infection and should not be used as the sole basis for treatment or other patient management decisions. A negative result may occur with  improper specimen collection/handling, submission of specimen other than nasopharyngeal swab, presence of viral mutation(s) within the areas targeted by this assay, and inadequate number of viral copies(<138 copies/mL). A negative result must be combined with clinical observations, patient history, and epidemiological information.  The expected result is Negative.  Fact Sheet for Patients:  EntrepreneurPulse.com.au  Fact Sheet for Healthcare Providers:  IncredibleEmployment.be  This test is no t yet approved or cleared by the [place] FDA and  has been authorized for detection and/or diagnosis of  SARS-CoV-2 by FDA under an Emergency Use Authorization (EUA). This EUA will remain  in effect (meaning this test can be used) for the duration of the COVID-19 declaration under Section 564(b)(1) of the Act, 21 U.S.C.section 360bbb-3(b)(1), unless the authorization is terminated  or revoked sooner.       Influenza A by PCR NEGATIVE NEGATIVE Final   Influenza B by PCR NEGATIVE NEGATIVE Final    Comment: (NOTE) The Xpert Xpress SARS-CoV-2/FLU/RSV plus assay is intended as an aid in the diagnosis of influenza from Nasopharyngeal swab specimens and should not be used as a sole basis for treatment. Nasal washings and aspirates are unacceptable for Xpert Xpress SARS-CoV-2/FLU/RSV testing.  Fact Sheet for Patients: EntrepreneurPulse.com.au  Fact Sheet for Healthcare Providers: IncredibleEmployment.be  This test is not yet approved or cleared by the [place] FDA and has been authorized for detection and/or diagnosis of SARS-CoV-2 by FDA under an Emergency Use Authorization (EUA). This EUA will remain in effect (meaning this test can be used) for the duration of the COVID-19 declaration under Section 564(b)(1) of the Act, 21 U.S.C. section 360bbb-3(b)(1), unless the authorization is terminated or revoked.  Performed at [facility], [place] [address], [place], [place] [address]     Please note: You were cared for by a hospitalist during your hospital stay. Once you are discharged, your primary care physician will handle any further medical issues. Please note that NO REFILLS for any discharge medications will be authorized once you are discharged, as it is imperative that you return to your primary care physician (or establish a relationship with a primary care physician if you do not have one) for your post hospital discharge needs so that they can reassess your need for medications and monitor your lab values.    Time  coordinating discharge: 40 minutes  SIGNED:   Shelly Coss, MD  [facility] 11/10/2020, 1:36 PM Pager [phone]  If 7PM-7AM, please contact night-coverage [email or web address] Password [email or web address]

## 2020-11-12 ENCOUNTER — Ambulatory Visit: Payer: Medicare Other

## 2020-11-14 LAB — CULTURE, BLOOD (ROUTINE X 2)
Culture: NO GROWTH
Culture: NO GROWTH
Special Requests: ADEQUATE

## 2020-11-16 ENCOUNTER — Telehealth: Payer: Self-pay | Admitting: *Deleted

## 2020-11-16 NOTE — Telephone Encounter (Signed)
PA form has been received for the ipratropium-albuterol nebulizer medication.  I have placed this form in AO look at for him to sign and get this faxed in.  Will forward to AO and MH to follow up on.

## 2020-11-19 ENCOUNTER — Other Ambulatory Visit: Payer: Self-pay

## 2020-11-19 ENCOUNTER — Inpatient Hospital Stay: Payer: Medicare Other

## 2020-11-19 DIAGNOSIS — R55 Syncope and collapse: Secondary | ICD-10-CM | POA: Diagnosis not present

## 2020-11-19 DIAGNOSIS — F419 Anxiety disorder, unspecified: Secondary | ICD-10-CM | POA: Diagnosis not present

## 2020-11-19 DIAGNOSIS — Z79899 Other long term (current) drug therapy: Secondary | ICD-10-CM | POA: Diagnosis not present

## 2020-11-19 DIAGNOSIS — Z95828 Presence of other vascular implants and grafts: Secondary | ICD-10-CM

## 2020-11-19 DIAGNOSIS — J449 Chronic obstructive pulmonary disease, unspecified: Secondary | ICD-10-CM | POA: Diagnosis not present

## 2020-11-19 DIAGNOSIS — E538 Deficiency of other specified B group vitamins: Secondary | ICD-10-CM | POA: Diagnosis not present

## 2020-11-19 DIAGNOSIS — Z9981 Dependence on supplemental oxygen: Secondary | ICD-10-CM | POA: Diagnosis not present

## 2020-11-19 DIAGNOSIS — D509 Iron deficiency anemia, unspecified: Secondary | ICD-10-CM | POA: Diagnosis not present

## 2020-11-19 DIAGNOSIS — R413 Other amnesia: Secondary | ICD-10-CM | POA: Diagnosis not present

## 2020-11-19 DIAGNOSIS — Z85028 Personal history of other malignant neoplasm of stomach: Secondary | ICD-10-CM | POA: Diagnosis not present

## 2020-11-19 DIAGNOSIS — C169 Malignant neoplasm of stomach, unspecified: Secondary | ICD-10-CM

## 2020-11-19 DIAGNOSIS — Z8616 Personal history of COVID-19: Secondary | ICD-10-CM | POA: Diagnosis not present

## 2020-11-19 DIAGNOSIS — F32A Depression, unspecified: Secondary | ICD-10-CM | POA: Diagnosis not present

## 2020-11-19 DIAGNOSIS — F17210 Nicotine dependence, cigarettes, uncomplicated: Secondary | ICD-10-CM | POA: Diagnosis not present

## 2020-11-19 MED ORDER — CYANOCOBALAMIN 1000 MCG/ML IJ SOLN
1000.0000 ug | Freq: Once | INTRAMUSCULAR | Status: AC
  Administered 2020-11-19: 1000 ug via INTRAMUSCULAR

## 2020-11-19 NOTE — Patient Instructions (Signed)

## 2020-11-22 ENCOUNTER — Telehealth: Payer: Self-pay | Admitting: Hematology

## 2020-11-22 NOTE — Telephone Encounter (Signed)
Scheduled appt per 5/15 sch msg. Pt's daughter is aware.

## 2020-11-24 NOTE — Progress Notes (Signed)
[facility]   [phone] Fax:([phone]) [phone]   Clinic Follow up Note   Patient Care Team: [name], MD as PCP - General (Hematology) [name], MD as Consulting Physician (Pulmonary Disease) [name], MD as Attending Physician (Gastroenterology) [name], MD as Consulting Physician (General Surgery) Laurin Coder, MD as Consulting Physician (Pulmonary Disease)  Date of Service:  11/26/2020  CHIEF COMPLAINT: F/u on gastric adenocarcinoma  SUMMARY OF ONCOLOGIC HISTORY: Oncology History Overview Note  Cancer Staging Gastric cancer [facility]) Staging form: Stomach, AJCC 8th Edition - Clinical stage from 05/24/2017: Stage I (cT2, cN0, cM0) - Signed by [name], MD on 06/19/2017 - Pathologic stage from 11/27/2017: Stage I (ypT1b, pN0, cM0) - Signed by [name], MD on 04/04/2018     Gastric adenocarcinoma ([facility])  05/24/2017 Procedure   EGD by Dr. [name] 05/24/17 IMPRESSION Ulcerated mass like region in the distal stomach (4cm across), extensively biopsied. This appears neoplastic. - Abnormal mucosa throughout stomach otherwise (mild-moderate gastritis), also extensively biopsied.   05/24/2017 Initial Biopsy   Diagnosis 05/24/17  1. Stomach, biopsy, mass distal - ADENOCARCINOMA, SEE COMMENT. - CHRONIC ACTIVE GASTRITIS WITH INTESTINAL METAPLASIA. 2. 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 - NO DYSPLASIA OR MALIGNANCY   06/04/2017 Initial Diagnosis   Gastric cancer ([facility])   06/07/2017 Procedure   EUS by Dr. [name] on 06/07/17  IMPRESSION:  5cm across, partially circumferential uT2N0 gastric adenocarcinoma laying in the distal stomach 3-4cm proximal to the pylorus.   06/22/2017 Imaging   CT CAP W Contrast 06/22/17  IMPRESSION: 1. The gastric mass is somewhat inconspicuous on CT, but a potential 1.2 cm thick mucosal masses seen along the posterior  gastric border on image 83/2. There several small adjacent right gastric lymph nodes measuring up to 0.7 cm in short axis, but no overtly pathologic adenopathy and no findings of hepatic or peritoneal metastatic disease at this time. 2. Calcified granulomas in both lungs along with several noncalcified nodules stable from August 2016 and likely benign given this long-term stability. 3. Other imaging findings of potential clinical significance: Aortic Atherosclerosis (ICD10-I70.0) and Emphysema (ICD10-J43.9). Airway thickening is present, suggesting bronchitis or reactive airways disease. 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 Disc bulges at L3-4 and L4-5.   07/05/2017 Surgery   PAC placement    07/07/2017 - 09/06/2017 Chemotherapy   Neoadjuvant FOLFOX with with leucovorin every 2 weeks. Plan for 6 cycles but stopped after 4 cycles due to poor tolerance     09/10/2017 Imaging    CT AP W Contrast 09/10/17 IMPRESSION: 1. No acute findings identified within the abdomen or pelvis. 2. Interval development of multiple bilateral pulmonary densities within both lower lobes. These are indeterminate, likely post infectious or inflammatory in etiology. Atypical infection not excluded. Metastatic disease is considered less favored but not entirely excluded and follow-up imaging to ensure resolution is advised. 3. No evidence to suggest recurrent tumor or metastatic disease within the abdomen or pelvis. 4.  Aortic Atherosclerosis (ICD10-I70.0).   11/27/2017 Surgery    Surgery He had partial gastrectomy with Dr. [name] on 11/27/2017.   11/27/2017 Pathology Results   11/27/2017 Surgical Pathology Diagnosis 1. Stomach, resection for tumor, Distal w/ Omentum - ADENOCARCINOMA, MODERATELY DIFFERENTIATED, 3.8 CM - LYMPHOVASCULAR SPACE AND PERINEURAL INVASION - NO CARCINOMA IDENTIFIED IN FOUR LYMPH NODES (0/4) - SEE  ONCOLOGY TABLE AND COMMENT BELOW 2.  Lymph node, biopsy, Portal - NO CARCINOMA IDENTIFIED IN ONE LYMPH NODE (0/1) - SEE COMMENT 3. Lymph node, biopsy, Left Gastric - NO CARCINOMA IDENTIFIED IN TWO LYMPH NODES (0/2) - SEE COMMENT 4. Omentum, resection for tumor - BENIGN FIBROADIPOSE TISSUE - NO CARCINOMA IDENTIFIED    11/27/2017 Cancer Staging   Staging form: Stomach, AJCC 8th Edition - Pathologic stage from 11/27/2017: Stage I (ypT1b, pN0, cM0) - Signed by [name], MD on 04/04/2018   08/14/2018 Imaging   CT CAP 08/14/18 IMPRESSION:  1. No findings of active malignancy. 2. Generally stable pulmonary nodules are identified in the lungs. The only new nodule seen today is a 3 mm right apical nodule on image 32/4 which may merit surveillance. Many of the basilar nodules shown on the prior CT abdomen from 09/10/2017 have resolved. 3. Other imaging findings of potential clinical significance: Aortic Atherosclerosis (ICD10-I70.0). Emphysema (ICD10-J43.9). Lumbar degenerative disc disease.   08/22/2018 Procedure   Upper endoscopy 08/22/18 by Dr Verdie Drown The esophagus was slightly tortuous but there were no strictures/stenosis and the mucosa was normal throughout. The gastro-jejunostomy site was widely patent, Bilroth II anatomy. There was a 1.5-2cm region of the anastomosis that was discretely abnormal, soft but somewhat neoplastic appearing and nodular. I biopsied this region extensively. Diagnosis Stomach, biopsy, GJ anastomosis erythema - LOW GRADE GLANDULAR DYSPLASIA. - GOBLET CELL METAPLASIA. - SEE COMMENT.   11/28/2018 Procedure   Upper Endoscopy 11/28/18 by Dr Verdie Drown - Bilroth II anatomy. The previously noted adenomatous polyp was again located at the Cut Off anastomosis. It was soft, sessile, about 1.9cm across in greatest dimension. I used mixed cold/cautery snare resection to (apparently) completely remove the polyp. Diagnosis Stomach, polyp(s) - LOW GRADE  GLANDULAR DYSPLASIA. - GOBLET CELL METAPLASIA.   07/31/2019 Procedure   Upper Endoscopy by Dr. [name] 07/31/19  IMPRESSION - Typical Bilroth II anatomy. The GJ anastomosis was slightly inflammed, but not overtly neoplastic appearing. No clear recurrent or residual polyps. The anastomosis was sampled at the sight of most significant appearing inflammation (1-2cm section, see images). - Afferent and efferent limbs were intubated briefly and appeared normal. - Mild candida esophagitis, not sampled. Will start empiric diflucan course (100mg  daily for 10 days) - The examination was otherwise normal. FINAL MICROSCOPIC DIAGNOSIS:   A.  IRREGULAR MUCOSA AT GJ JUNCTION, BIOPSY:  -Anastomosis site with peptic change and low grade dysplasia.    04/01/2020 Imaging   CT CAP IMPRESSION: 1. Redemonstrated postoperative findings of distal gastrectomy and gastric jejunostomy. No evidence of malignant recurrence. 2. There is incidental note of intussusception of the jejunum into the gastric lumen, likely transient and peristaltic in nature. 3. Stable small pulmonary nodules scattered throughout the lungs. Attention on follow-up. 4. Additional scattered, clustered centrilobular and tree-in-bud nodules in the dependent right lower lobe, most consistent with aspiration given the presence of frothy debris in the airways. 5. Severe emphysema and diffuse bilateral bronchial wall thickening. Emphysema (ICD10-J43.9). 6. Coronary artery disease. 7. 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 Aortic Atherosclerosis (ICD10-I70.0).      CURRENT THERAPY:  Gastric cancer Surveillance IV Venofer since 11/09/20 as needed  B12 injection since 11/09/20, weekly for 8 weeks then monthly  INTERVAL HISTORY:  Bobby Caldwell is here for a follow up. He presents to the clinic with his wife and electronic  albanian interpretor. After 11/09/20 IV Venofer he had syncope and  was admitted to hospital. He notes since episode he has dizziness and lack of appetite. I reviewed his medication list with him. He is not sure if he has been using Mirtazapine. He notes he has been feeling sad lately.  He remains on supplemental oxygen continuously. He also notes injections. He notes he wants to continue to be seen more frequently.     REVIEW OF SYSTEMS:   Constitutional: Denies fevers, chills or abnormal weight loss (+) Low appetite, dizziness Eyes: Denies blurriness of vision Ears, nose, mouth, throat, and face: Denies mucositis or sore throat Respiratory: Denies cough, dyspnea or wheezes (+) supplemental oxygen continuously. Cardiovascular: Denies palpitation, chest discomfort or lower extremity swelling Gastrointestinal:  Denies nausea, heartburn or change in bowel habits Skin: Denies abnormal skin rashes Lymphatics: Denies new lymphadenopathy or easy bruising Neurological:Denies numbness, tingling or new weaknesses Behavioral/Psych: Mood is stable, no new changes (+) Depression  All other systems were reviewed with the patient and are negative.  MEDICAL HISTORY:  Past Medical History:  Diagnosis Date  . COPD (chronic obstructive pulmonary disease) ([facility])   . Dyspnea   . PAD (peripheral artery disease) ([facility])   . Stomach cancer ([facility])   . Tobacco abuse   . Weight loss     SURGICAL HISTORY: Past Surgical History:  Procedure Laterality Date  . ABDOMINAL AORTOGRAM W/LOWER EXTREMITY N/A 05/03/2017   Procedure: ABDOMINAL AORTOGRAM W/LOWER EXTREMITY;  Surgeon: Conrad [place], MD;  Location: [facility];  Service: Cardiovascular;  Laterality: N/A;  . BIOPSY  08/22/2018   Procedure: BIOPSY;  Surgeon: [name], MD;  Location: [facility];  Service: Endoscopy;;  . BIOPSY  07/31/2019   Procedure: BIOPSY;  Surgeon: [name], MD;  Location: [facility];  Service: Endoscopy;;  .  ESOPHAGOGASTRODUODENOSCOPY     05/24/17  . ESOPHAGOGASTRODUODENOSCOPY (EGD) WITH PROPOFOL N/A 05/24/2017   Procedure: ESOPHAGOGASTRODUODENOSCOPY (EGD) WITH PROPOFOL;  Surgeon: [name], MD;  [facility];  Service: Endoscopy;  Laterality: N/A;  . ESOPHAGOGASTRODUODENOSCOPY (EGD) WITH PROPOFOL N/A 08/22/2018   Procedure: ESOPHAGOGASTRODUODENOSCOPY (EGD) WITH PROPOFOL;  Surgeon: [name], MD;  [facility];  Service: Endoscopy;  Laterality: N/A;  . ESOPHAGOGASTRODUODENOSCOPY (EGD) WITH PROPOFOL N/A 11/28/2018   Procedure: ESOPHAGOGASTRODUODENOSCOPY (EGD) WITH PROPOFOL;  Surgeon: [name], MD;  [facility];  Service: Endoscopy;  Laterality: N/A;  . ESOPHAGOGASTRODUODENOSCOPY (EGD) WITH PROPOFOL N/A 07/31/2019   Procedure: ESOPHAGOGASTRODUODENOSCOPY (EGD) WITH PROPOFOL;  Surgeon: [name], MD;  [facility];  Service: Endoscopy;  Laterality: N/A;  . EUS N/A 06/07/2017   Procedure: UPPER ENDOSCOPIC ULTRASOUND (EUS) RADIAL;  Surgeon: [name], MD;  Location: [facility];  Service: Endoscopy;  Laterality: N/A;  . FEMORAL-FEMORAL BYPASS GRAFT Bilateral 05/08/2017   Procedure: LEFT TO RIGHT BYPASS GRAFT FEMORAL-FEMORAL ARTERY;  Surgeon: Conrad [place], MD;  Location: [facility];  Service: Vascular;  Laterality: Bilateral;  . IR FLUORO GUIDE PORT INSERTION RIGHT  07/05/2017  . IR US GUIDE VASC ACCESS RIGHT  07/05/2017  . LAPAROSCOPIC INSERTION GASTROSTOMY TUBE N/A 11/27/2017   Procedure: JEJUNOSTOMY TUBE PLACEMENT;  Surgeon: [name], MD;  Location: [facility];  Service: General;  Laterality: N/A;  . LAPAROSCOPIC PARTIAL GASTRECTOMY N/A 11/27/2017   Procedure: PARTIAL GASTRECTOMY;  Surgeon: [name], MD;  Location: [facility];  Service: General;  Laterality: N/A;  . LAPAROSCOPY N/A 11/27/2017   Procedure: LAPAROSCOPY DIAGNOSTIC;  Surgeon: [name], MD;  Location: [facility];  Service: General;  Laterality: N/A;  .  NM MYOVIEW LTD   06/2017   LOW RISK. Small basal inferoseptal defect thought to be related to artifact (although cannot exclude prior infarct). Normal wall motion i in this area would suggest artifact not infarct. Otherwise no ischemia or infarction.  Marland Kitchen PERIPHERAL VASCULAR INTERVENTION Left 05/03/2017   Procedure: PERIPHERAL VASCULAR INTERVENTION;  Surgeon: Conrad [place], MD;  Location: [facility];  Service: Cardiovascular;  Laterality: Left;  common iliac  . POLYPECTOMY  11/28/2018   Procedure: POLYPECTOMY;  Surgeon: [name], MD;  [facility];  Service: Endoscopy;;  . TRANSTHORACIC ECHOCARDIOGRAM  06/26/2017   Normal LV size and function. EF 60-65%. No wall motion abnormalities. GR 1 DD. Mild LA dilation    I have reviewed the social history and family history with the patient and they are unchanged from previous note.  ALLERGIES:  has No Known Allergies.  MEDICATIONS:  Current Outpatient Medications  Medication Sig Dispense Refill  . Budeson-Glycopyrrol-Formoterol (BREZTRI AEROSPHERE) 160-9-4.8 MCG/ACT AERO Inhale 2 puffs into the lungs 2 (two) times daily. 5.9 g 0  . feeding supplement, ENSURE ENLIVE, (ENSURE ENLIVE) LIQD Take 237 mLs by mouth 2 (two) times daily between meals. (Patient not taking: Reported on 11/09/2020) 14222 mL 0  . ipratropium-albuterol (DUONEB) 0.5-2.5 (3) MG/3ML SOLN Take 3 mLs by nebulization every 4 (four) hours as needed. Dx J44.9 (Patient taking differently: Take 3 mLs by nebulization every 4 (four) hours as needed (wheezing). Dx J44.9) 360 mL 5  . midodrine (PROAMATINE) 5 MG tablet Take 1 tablet (5 mg total) by mouth 3 (three) times daily with meals. 90 tablet 0  . mirtazapine (REMERON) 7.5 MG tablet Take 1 tablet (7.5 mg total) by mouth at bedtime. 30 tablet 5  . Multiple Vitamin (MULTIVITAMIN WITH MINERALS) TABS tablet Take 1 tablet by mouth daily. 30 tablet 0  . ondansetron (ZOFRAN) 4 MG tablet Take 1 tablet (4 mg total) by mouth every 8 (eight) hours  as needed for nausea or vomiting. 15 tablet 0  . OXYGEN Inhale 2 L into the lungs continuous.     No current facility-administered medications for this visit.    PHYSICAL EXAMINATION: ECOG PERFORMANCE STATUS: 3 - Symptomatic, >50% confined to bed  Vitals:   11/26/20 0922  BP: 114/76  Pulse: 89  Resp: 18  Temp: 98.2 F (36.8 C)  SpO2: 100%   Filed Weights    GENERAL:alert, no distress and comfortable SKIN: skin color, texture, turgor are normal, no rashes or significant lesions EYES: normal, Conjunctiva are pink and non-injected, sclera clear  NECK: supple, thyroid normal size, non-tender, without nodularity LYMPH:  no palpable lymphadenopathy in the cervical, axillary  LUNGS: clear to auscultation and percussion with normal breathing effort (+) On continuous oxygen with nasal canula HEART: regular rate & rhythm and no murmurs and no lower extremity edema ABDOMEN:abdomen soft, non-tender and normal bowel sounds Musculoskeletal:no cyanosis of digits and no clubbing  NEURO: alert & oriented x 3 with fluent speech, no focal motor/sensory deficits EXAM PERFORMED IN WHEELCHAIR TODAY   LABORATORY DATA:  I have reviewed the data as listed CBC Latest Ref Rng & Units 11/10/2020 11/09/2020 10/22/2020  WBC 4.0 - 10.5 K/uL 10.4 12.0(H) 6.2  Hemoglobin 13.0 - 17.0 g/dL 10.8(L) 16.9 12.2(L)  Hematocrit 39.0 - 52.0 % 35.0(L) 54.3(H) 38.4(L)  Platelets 150 - 400 K/uL 123(L) 222 168     CMP Latest Ref Rng & Units 11/10/2020 11/09/2020 10/22/2020  Glucose 70 - 99 mg/dL 72 163(H) 91  BUN 8 - 23 mg/dL 10 12 11   Creatinine 0.61 - 1.24 mg/dL 0.40(L) 0.44(L) 0.54(L)  Sodium 135 - 145 mmol/L 141 141 141  Potassium 3.5 - 5.1 mmol/L 4.2 4.6 4.1  Chloride 98 - 111 mmol/L 107 99 97(L)  CO2 22 - 32 mmol/L 30 30 36(H)  Calcium 8.9 - 10.3 mg/dL 7.4(L) 9.0 8.7(L)  Total Protein 6.5 - 8.1 g/dL - 6.3(L) 6.4(L)  Total Bilirubin 0.3 - 1.2 mg/dL - 0.7 0.4  Alkaline Phos 38 - 126 U/L - 71 86  AST 15 - 41 U/L -  17 18  ALT 0 - 44 U/L - 11 14      RADIOGRAPHIC STUDIES: I have personally reviewed the radiological images as listed and agreed with the findings in the report. No results found.   ASSESSMENT & PLAN:  Bobby Caldwell is a 65 y.o. male with    1. Gastric Adenocarcinoma, uT2N0M0, stage I, ypT1bN0 -Diagnosed in 05/2017. Treated withneoadjuvantchemo and surgery. Currently on surveillance. -His Upper Endoscopy with Dr [name] from 07/31/19 showsno evidence of recurrence, mild esophagitis, which was treated with fluconazole. Last CT CAP from 04/01/20 was NED  -From a cancer standpoint he is clinically doing well. I discussed I am more concerned for his overall health and comorbidities and recommend he work on better managing this now.  -Continue surveillance. Plan for liver and abdominal scan in 03/2021.  -Will continue port flush every 6-8 weeks. Will discuss PAC removal after next scan.  -F/u in 6 weeks.   2. Abdominal pain, weight loss, fatigue, recent syncope  -He had abdominal pain With weight loss and fatigue in 03/2020. His CT CAP from 04/01/20 showed NED.  -He did not tolerate prior tramadol. Abdominal pain has resolved currently.  -He has been able to gain and maintain weight lately. He is no longer using Megace. He can continue Mirtazapine.  -He had syncopal episode on 11/09/20. In recent weeks he has had dizziness and loss of appetite. He notes feeling sad. I discussed restarting Mirtazapine to help his mood and appetite. He is agreeable.   3. Anemia with Iron and B12 deficiency  -I discussed this is secondary to his stomach surgery.  -I started him on B12 injection on 11/09/20 weekly for 8 weeks then once monthly -I also started him on IV iron Venofer on 11/09/20. He did have syncope after first dose, but I discussed this is related to his dehydration.  -Will continue IV iron as needed and B12 injections.   4. COPD, Smoking Cessation -Has been on supplemental oxygen for 4 years, now  currently on continuous supplemental oxygen with nasal canula at 3-4L. He also uses nebulizer more often.  -Continue to f/u with pulmonologistDr.Olalere for management.   5.Depression and or anxiety -Currently on Ativan as needed. Continue and monitor.  6. COVID19 (+)on 06/23/19 (quite symptomatic, but did not require hospitalization) - He recovered completely   7.Memory loss -Pt previously noted decrease in memory and increase agitation.  -I recommend he discuss this with PCP for further evaluation or referral.I previously gave him information to contact [facility].   Plan -I refilled Mirtazapine today  -Proceed with B12 injection today  -B12 injection weekly X5, then monthly  -Lab and F/u in 6 weeks. -will reach out to his pulmonologist for his f/u appointment   No problem-specific Assessment & Plan notes found for this encounter.   No orders of the defined types were placed in this encounter.  All questions were answered. The  patient knows to call the clinic with any problems, questions or concerns. No barriers to learning was detected. The total time spent in the appointment was 30 minutes.     [name], MD 11/26/2020   I, Joslyn Devon, am acting as scribe for [name], MD.   I have reviewed the above documentation for accuracy and completeness, and I agree with the above.

## 2020-11-26 ENCOUNTER — Encounter: Payer: Self-pay | Admitting: Hematology

## 2020-11-26 ENCOUNTER — Other Ambulatory Visit: Payer: Self-pay

## 2020-11-26 ENCOUNTER — Inpatient Hospital Stay (HOSPITAL_BASED_OUTPATIENT_CLINIC_OR_DEPARTMENT_OTHER): Payer: Medicare Other | Admitting: Hematology

## 2020-11-26 ENCOUNTER — Inpatient Hospital Stay: Payer: Medicare Other

## 2020-11-26 VITALS — BP 114/76 | HR 89 | Temp 98.2°F | Resp 18

## 2020-11-26 DIAGNOSIS — Z95828 Presence of other vascular implants and grafts: Secondary | ICD-10-CM

## 2020-11-26 DIAGNOSIS — C169 Malignant neoplasm of stomach, unspecified: Secondary | ICD-10-CM

## 2020-11-26 DIAGNOSIS — Z85028 Personal history of other malignant neoplasm of stomach: Secondary | ICD-10-CM | POA: Diagnosis not present

## 2020-11-26 DIAGNOSIS — J439 Emphysema, unspecified: Secondary | ICD-10-CM | POA: Diagnosis not present

## 2020-11-26 MED ORDER — MIRTAZAPINE 7.5 MG PO TABS: 7.5000 mg | ORAL_TABLET | Freq: Every day | ORAL | 5 refills | Status: DC

## 2020-11-26 MED ORDER — CYANOCOBALAMIN 1000 MCG/ML IJ SOLN
1000.0000 ug | Freq: Once | INTRAMUSCULAR | Status: AC
  Administered 2020-11-26: 1000 ug via INTRAMUSCULAR

## 2020-11-26 MED ORDER — CYANOCOBALAMIN 1000 MCG/ML IJ SOLN
INTRAMUSCULAR | Status: AC
  Filled 2020-11-26: qty 1

## 2020-11-26 NOTE — Patient Instructions (Signed)

## 2020-11-26 NOTE — Telephone Encounter (Signed)
Can we make sure he has a follow-up appointment at some point soon

## 2020-11-29 NOTE — Telephone Encounter (Signed)
LMTCB for pt's daughter, Blanche East.

## 2020-12-02 ENCOUNTER — Encounter (HOSPITAL_COMMUNITY): Payer: Self-pay | Admitting: Gastroenterology

## 2020-12-02 ENCOUNTER — Other Ambulatory Visit: Payer: Self-pay

## 2020-12-03 ENCOUNTER — Inpatient Hospital Stay: Payer: Medicare Other

## 2020-12-03 ENCOUNTER — Ambulatory Visit: Payer: Medicare Other

## 2020-12-03 DIAGNOSIS — Z85028 Personal history of other malignant neoplasm of stomach: Secondary | ICD-10-CM | POA: Diagnosis not present

## 2020-12-03 DIAGNOSIS — Z95828 Presence of other vascular implants and grafts: Secondary | ICD-10-CM

## 2020-12-03 DIAGNOSIS — D508 Other iron deficiency anemias: Secondary | ICD-10-CM

## 2020-12-03 DIAGNOSIS — E538 Deficiency of other specified B group vitamins: Secondary | ICD-10-CM

## 2020-12-03 DIAGNOSIS — C162 Malignant neoplasm of body of stomach: Secondary | ICD-10-CM

## 2020-12-03 DIAGNOSIS — C169 Malignant neoplasm of stomach, unspecified: Secondary | ICD-10-CM

## 2020-12-03 LAB — COMPREHENSIVE METABOLIC PANEL
ALT: 11 U/L (ref 0–44)
AST: 13 U/L — ABNORMAL LOW (ref 15–41)
Albumin: 3.8 g/dL (ref 3.5–5.0)
Alkaline Phosphatase: 81 U/L (ref 38–126)
Anion gap: 7 (ref 5–15)
BUN: 9 mg/dL (ref 8–23)
CO2: 38 mmol/L — ABNORMAL HIGH (ref 22–32)
Calcium: 8.9 mg/dL (ref 8.9–10.3)
Chloride: 98 mmol/L (ref 98–111)
Creatinine, Ser: 0.52 mg/dL — ABNORMAL LOW (ref 0.61–1.24)
GFR, Estimated: >60 mL/min (ref >60)
Glucose, Bld: 87 mg/dL (ref 70–99)
Potassium: 4.3 mmol/L (ref 3.5–5.1)
Sodium: 143 mmol/L (ref 135–145)
Total Bilirubin: 0.4 mg/dL (ref 0.3–1.2)
Total Protein: 6.5 g/dL (ref 6.5–8.1)

## 2020-12-03 LAB — CBC WITH DIFFERENTIAL/PLATELET
Abs Immature Granulocytes: 0.01 10*3/uL (ref 0.00–0.07)
Basophils Absolute: 0.1 10*3/uL (ref 0.0–0.1)
Basophils Relative: 1 %
Eosinophils Absolute: 0.1 10*3/uL (ref 0.0–0.5)
Eosinophils Relative: 1 %
HCT: 41.3 % (ref 39.0–52.0)
Hemoglobin: 13.1 g/dL (ref 13.0–17.0)
Immature Granulocytes: 0 %
Lymphocytes Relative: 24 %
Lymphs Abs: 1.2 10*3/uL (ref 0.7–4.0)
MCH: 29.6 pg (ref 26.0–34.0)
MCHC: 31.7 g/dL (ref 30.0–36.0)
MCV: 93.4 fL (ref 80.0–100.0)
Monocytes Absolute: 0.3 10*3/uL (ref 0.1–1.0)
Monocytes Relative: 6 %
Neutro Abs: 3.4 10*3/uL (ref 1.7–7.7)
Neutrophils Relative %: 68 %
Platelets: 148 10*3/uL — ABNORMAL LOW (ref 150–400)
RBC: 4.42 MIL/uL (ref 4.22–5.81)
RDW: 14.6 % (ref 11.5–15.5)
WBC: 5 10*3/uL (ref 4.0–10.5)
nRBC: 0 % (ref 0.0–0.2)

## 2020-12-03 LAB — FERRITIN: Ferritin: 56 ng/mL (ref 24–336)

## 2020-12-03 LAB — VITAMIN B12: Vitamin B-12: 508 pg/mL (ref 180–914)

## 2020-12-03 MED ORDER — CYANOCOBALAMIN 1000 MCG/ML IJ SOLN
INTRAMUSCULAR | Status: AC
  Filled 2020-12-03: qty 1

## 2020-12-03 MED ORDER — CYANOCOBALAMIN 1000 MCG/ML IJ SOLN
1000.0000 ug | Freq: Once | INTRAMUSCULAR | Status: AC
  Administered 2020-12-03: 1000 ug via INTRAMUSCULAR

## 2020-12-03 MED ORDER — SODIUM CHLORIDE 0.9% FLUSH
10.0000 mL | Freq: Once | INTRAVENOUS | Status: AC
Start: 2020-12-03 — End: 2020-12-03
  Administered 2020-12-03: 10 mL
  Filled 2020-12-03: qty 10

## 2020-12-03 MED ORDER — HEPARIN SOD (PORK) LOCK FLUSH 100 UNIT/ML IV SOLN
500.0000 [IU] | Freq: Once | INTRAVENOUS | Status: AC
Start: 2020-12-03 — End: 2020-12-03
  Administered 2020-12-03: 500 [IU]
  Filled 2020-12-03: qty 5

## 2020-12-03 NOTE — Patient Instructions (Signed)

## 2020-12-07 ENCOUNTER — Other Ambulatory Visit (HOSPITAL_COMMUNITY): Payer: Medicare Other

## 2020-12-09 ENCOUNTER — Ambulatory Visit (HOSPITAL_COMMUNITY): Admission: RE | Admit: 2020-12-09 | Payer: Medicare Other | Source: Home / Self Care | Admitting: Gastroenterology

## 2020-12-09 ENCOUNTER — Telehealth: Payer: Self-pay | Admitting: Gastroenterology

## 2020-12-09 HISTORY — DX: Dependence on supplemental oxygen: Z99.81

## 2020-12-09 SURGERY — ESOPHAGOGASTRODUODENOSCOPY (EGD) WITH PROPOFOL
Anesthesia: Monitor Anesthesia Care

## 2020-12-09 NOTE — Telephone Encounter (Signed)
Tried to reach pt and using interpreter unable to leave a message. Voice mail full.  Will try later Will offer 7/7/ 1030 am at 1030 am

## 2020-12-09 NOTE — Progress Notes (Signed)
Pt did not show up for appointment. Attempted to call morning of procedure. Got a call back from daughter after leaving a message at the time procedure was to start. She said they were not coming and would reschedule. Gave her Dr. [name] office number [phone]. She stated she would call to reschedule him.

## 2020-12-10 ENCOUNTER — Inpatient Hospital Stay: Payer: Medicare Other | Attending: Hematology

## 2020-12-10 ENCOUNTER — Telehealth: Payer: Self-pay | Admitting: Gastroenterology

## 2020-12-10 DIAGNOSIS — D519 Vitamin B12 deficiency anemia, unspecified: Secondary | ICD-10-CM | POA: Insufficient documentation

## 2020-12-10 NOTE — Telephone Encounter (Signed)
Tried again to reach the pt and voice mail is full.     Inbound call from pt stated that he needed to resch appt that was originally sch'd for 12/09/20 at [facility]. Please give him a call. Thank you

## 2020-12-10 NOTE — Telephone Encounter (Signed)
See alternate note dated 6/2

## 2020-12-10 NOTE — Telephone Encounter (Signed)
Inbound call from pt stated that he needed to resch appt that was originally sch'd for 12/09/20 at [facility]. Please give him a call. Thank you

## 2020-12-10 NOTE — Telephone Encounter (Signed)
Left message on machine to call back using interpreter  

## 2020-12-13 NOTE — Telephone Encounter (Signed)
Left message on machine to call back using interpreter  

## 2020-12-14 NOTE — Telephone Encounter (Signed)
I have been unable to reach the pt will mail letter to have him call our office.

## 2020-12-17 ENCOUNTER — Other Ambulatory Visit: Payer: Self-pay

## 2020-12-17 ENCOUNTER — Inpatient Hospital Stay: Payer: Medicare Other

## 2020-12-17 DIAGNOSIS — C169 Malignant neoplasm of stomach, unspecified: Secondary | ICD-10-CM

## 2020-12-17 DIAGNOSIS — D519 Vitamin B12 deficiency anemia, unspecified: Secondary | ICD-10-CM | POA: Diagnosis not present

## 2020-12-17 DIAGNOSIS — Z95828 Presence of other vascular implants and grafts: Secondary | ICD-10-CM

## 2020-12-17 MED ORDER — CYANOCOBALAMIN 1000 MCG/ML IJ SOLN
1000.0000 ug | Freq: Once | INTRAMUSCULAR | Status: AC
  Administered 2020-12-17: 1000 ug via INTRAMUSCULAR

## 2020-12-17 MED ORDER — CYANOCOBALAMIN 1000 MCG/ML IJ SOLN
INTRAMUSCULAR | Status: AC
  Filled 2020-12-17: qty 1

## 2020-12-17 NOTE — Patient Instructions (Signed)
Cyanocobalamin, Vitamin B12 injection O que  este medicamento? A CIANOCOBALAMINA  uma forma sinttica de vitamina B12. A vitamina B12  essencial para o desenvolvimento de glbulos sanguneos, clulas nervosas e protenas saudveis pelo organismo. Tambm ajuda no metabolismo das gorduras e carboidratos. Este medicamento  usado para tratar pessoas que no conseguem absorver vitamina B12 suficiente. Este medicamento pode ser usado para outros propsitos; em caso de dvidas, pergunte ao seu profissional de sade ou farmacutico. NOMES DE MARCAS COMUNS: B-12 Compliance Kit, B-12 Injection Kit, Cyomin, LA-12, Nutri-Twelve, Physicians EZ Use B-12, Primabalt O que devo dizer a meu profissional de sade antes de tomar este medicamento? Precisam saber se voc tem algum dos seguintes problemas ou estados de sade: doenas renais sndrome ou doena de Leber anemia megaloblstica reao estranha ou alergia  cianocobalamina ou ao cobalto reao estranha ou alergia a outros medicamentos, alimentos, corantes ou conservantes est grvida ou tentando engravidar est amamentando Como devo usar este medicamento? Este medicamento  injetado por via intramuscular ou por injeo subcutnea profunda. Costuma ser administrado por um profissional de sade em consultrio ou Chief of Staff. Porm,  possvel que seu mdico lhe ensine como aplicar suas prprias injees. Siga todas as instrues. Fale com seu pediatra a respeito do uso deste medicamento em crianas. Pode ser preciso tomar alguns cuidados especiais. Superdosagem: Se achar que tomou uma superdosagem deste medicamento, entre em contato imediatamente com o [facility]toxica[facility] ou v a [employer]. OBSERVAO: Este medicamento  s para voc. No compartilhe este medicamento com outras pessoas. E se eu deixar de tomar uma dose? Se toma o medicamento em uma clnica ou no consultrio do seu mdico, ligue para Paramedic a Passenger transport manager. Se aplica as  injees por conta prpria e perdeu uma dose, tome-a assim que possvel. Se j estiver quase na hora da sua prxima dose, tome somente essa dose. No tome o remdio em dobro, nem tome uma dose adicional. O que pode interagir com este medicamento? colchicina consumo pesado de lcool Esta lista pode no descrever todas as interaes possveis. D ao seu profissional de sade uma lista de todos os medicamentos, ervas medicinais, remdios de venda livre, ou suplementos alimentares que voc [place]. Diga tambm se voc fuma, bebe, ou [place] drogas ilcitas. Alguns destes podem interagir com o seu medicamento. Ao que devo ficar atento quando estiver [employer] medicamento? Consulte seu mdico ou profissional de sade para acompanhamento regular Museum/gallery curator. Voc precisar fazer exames de sangue peridicos enquanto estiver [employer]. Voc pode precisar seguir uma dieta especial. Fale com seu mdico. Para conseguir o mximo benefcio deste medicamento, limite o seu consumo de lcool e evite fumar. Que efeitos colaterais posso sentir aps usar este medicamento? Efeitos colaterais que devem ser informados ao seu mdico ou profissional de sade o mais rpido possvel: reaes alrgicas, como erupo na pele, coceira, urticria, ou inchao do rosto, dos lbios ou da lngua pele azulada dor ou aperto no peito chiado no peito ou dificuldade para respirar tontura rea vermelha, inchada e dolorosa na perna Efeitos colaterais que normalmente no precisam de cuidados mdicos (avise ao seu mdico ou profissional de sade se persistirem ou forem incmodos): diarreia dor de cabea Esta lista pode no descrever todos os efeitos colaterais possveis. Para mais orientaes sobre efeitos colaterais, consulte o seu mdico. Voc pode relatar a ocorrncia de efeitos colaterais  FDA pelo telefone [phone]. Onde devo guardar meu medicamento? [facility] fora do [employer]. Conservar em [employer], entre 15 e 30 degreesC (  59 e 86 degreesF). Proteger Administrator, arts. Descartar qualquer medicamento no utilizado aps a data de validade impressa no rtulo ou embalagem. OBSERVAO: Este folheto  um resumo. Pode no cobrir todas as informaes possveis. Se tiver dvidas a respeito deste medicamento, fale com seu mdico, farmacutico ou profissional de sade.  2021 Elsevier/Gold Standard (2010-03-30 00:00:00)

## 2020-12-24 ENCOUNTER — Inpatient Hospital Stay: Payer: Medicare Other

## 2020-12-24 ENCOUNTER — Telehealth: Payer: Self-pay | Admitting: Pulmonary Disease

## 2020-12-24 ENCOUNTER — Other Ambulatory Visit: Payer: Self-pay

## 2020-12-24 DIAGNOSIS — D519 Vitamin B12 deficiency anemia, unspecified: Secondary | ICD-10-CM | POA: Diagnosis not present

## 2020-12-24 DIAGNOSIS — Z95828 Presence of other vascular implants and grafts: Secondary | ICD-10-CM

## 2020-12-24 DIAGNOSIS — C169 Malignant neoplasm of stomach, unspecified: Secondary | ICD-10-CM

## 2020-12-24 MED ORDER — CYANOCOBALAMIN 1000 MCG/ML IJ SOLN
INTRAMUSCULAR | Status: AC
  Filled 2020-12-24: qty 1

## 2020-12-24 MED ORDER — CYANOCOBALAMIN 1000 MCG/ML IJ SOLN
1000.0000 ug | Freq: Once | INTRAMUSCULAR | Status: AC
  Administered 2020-12-24: 1000 ug via INTRAMUSCULAR

## 2020-12-24 NOTE — Telephone Encounter (Signed)
Called and spoke with patient's daughter Bobby Caldwell. She stated that the patient needed a refill on his Duoneb solution. I advised her that a refill had been sent in back in April for 5 refills. She stated that she would call the pharmacy to get the refill ready.   She also stated that the patient would like to see if he can get a POC. I advised her that since he has not been seen since February 2022, he would need an OV as well as a walk. I was able to get him scheduled for 06/21 at 1030am with AO. She will call him to make sure he doesn't have any other appts at that time.   Will leave encounter open for her call back.

## 2020-12-24 NOTE — Patient Instructions (Signed)

## 2020-12-28 ENCOUNTER — Ambulatory Visit (INDEPENDENT_AMBULATORY_CARE_PROVIDER_SITE_OTHER): Payer: Medicare Other | Admitting: Pulmonary Disease

## 2020-12-28 ENCOUNTER — Telehealth: Payer: Self-pay | Admitting: Pulmonary Disease

## 2020-12-28 ENCOUNTER — Other Ambulatory Visit: Payer: Self-pay

## 2020-12-28 ENCOUNTER — Encounter: Payer: Self-pay | Admitting: Pulmonary Disease

## 2020-12-28 VITALS — BP 120/80 | HR 86 | Temp 98.0°F | Ht 65.0 in | Wt 89.4 lb

## 2020-12-28 DIAGNOSIS — J9611 Chronic respiratory failure with hypoxia: Secondary | ICD-10-CM | POA: Diagnosis not present

## 2020-12-28 MED ORDER — IPRATROPIUM-ALBUTEROL 0.5-2.5 (3) MG/3ML IN SOLN: 3.0000 mL | RESPIRATORY_TRACT | 5 refills | Status: DC | PRN

## 2020-12-28 NOTE — Telephone Encounter (Signed)
Patient and his daughter Janit Pagan called from [place]. They stated that per the pharmacist at [facility], he needs a PA on his DuoNeb solution. I checked Covermymeds and did see that they had sent over a PA request. I have started the PA request and marked it as urgent. Key is [id number].   Will check back later for a response.

## 2020-12-28 NOTE — Progress Notes (Signed)
Bobby Caldwell    [id number]    04/26/1956  Primary Care Physician:[name], Bobby Blue, MD  Referring Physician: [name], MD [address] [place],  [place] [id number]  Chief complaint:   Follow-up for obstructive lung disease  HPI: Patient with chronic respiratory failure He is supposed to use oxygen around-the-clock but came to the office today without oxygen on  It was noted to be 81% on room air Was placed on oxygen while in the office being seen  We will ambulate him to see how much oxygen he needs He has a cough with minimal secretions Not feeling acutely ill  Continues to smoke about 8 cigarettes a day  Continues to use DuoNeb about every 3-4 hours Supposed to be on Breztri,-has not been using it regularly  He continues to use oxygen supplementation at night  Shortness of breath with exertion   History of stomach cancer Decreased appetite Still has cough with shortness of breath  Has been using oxygen around-the-clock  Has severe COPD/emphysema  Poor activity levels, decreased appetite for which he is on Megace  Started smoking at age 67, greater than 80-pack-year smoking history currently smoking about half a pack per day Was able to quit for a while but currently back smoking actively  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   midodrine (PROAMATINE) 5 MG tablet Take 1 tablet (5 mg total) by mouth 3 (three) times daily with meals.   mirtazapine (REMERON) 7.5 MG tablet Take 1 tablet (7.5 mg total) by mouth at bedtime.   Multiple Vitamin (MULTIVITAMIN WITH MINERALS) TABS tablet Take 1 tablet by mouth daily.    ondansetron (ZOFRAN) 4 MG tablet Take 1 tablet (4 mg total) by mouth every 8 (eight) hours as needed for nausea or vomiting.   OXYGEN Inhale 2 L into the lungs continuous.   feeding supplement, ENSURE ENLIVE, (ENSURE ENLIVE) LIQD Take 237 mLs by mouth 2 (two) times daily between meals. (Patient not taking: No sig reported)   ipratropium-albuterol (DUONEB) 0.5-2.5 (3) MG/3ML SOLN Take 3 mLs by nebulization every 4 (four) hours as needed. Dx J44.9 (Patient not taking: Reported on 12/28/2020)   No facility-administered encounter medications on file as of 12/28/2020.    Allergies as of 12/28/2020   (No Known Allergies)    Past Medical History:  Diagnosis Date   COPD (chronic obstructive pulmonary disease) (HCC)    Dyspnea    PAD (peripheral artery disease) (HCC)    Stomach cancer (HCC)    Supplemental oxygen dependent    3.5-4 L   Tobacco abuse    Weight loss     Past Surgical History:  Procedure Laterality Date   ABDOMINAL AORTOGRAM W/LOWER EXTREMITY N/A 05/03/2017   Procedure: ABDOMINAL AORTOGRAM W/LOWER EXTREMITY;  Surgeon: Conrad Alsen, MD;  Location: [place] [facility];  Service: Cardiovascular;  Laterality: N/A;   BIOPSY  08/22/2018   Procedure: BIOPSY;  Surgeon: [name], MD;  Location: [facility];  Service: Endoscopy;;   BIOPSY  07/31/2019   Procedure: BIOPSY;  Surgeon: [name], MD;  Location: [facility];  Service: Endoscopy;;   ESOPHAGOGASTRODUODENOSCOPY     05/24/17   ESOPHAGOGASTRODUODENOSCOPY (EGD)  WITH PROPOFOL N/A 05/24/2017   Procedure: ESOPHAGOGASTRODUODENOSCOPY (EGD) WITH PROPOFOL;  Surgeon: [name], MD;  Location: [facility];  Service: Endoscopy;  Laterality: N/A;   ESOPHAGOGASTRODUODENOSCOPY (EGD) WITH PROPOFOL N/A 08/22/2018   Procedure: ESOPHAGOGASTRODUODENOSCOPY (EGD) WITH PROPOFOL;  Surgeon: [name], MD;  [facility];  Service: Endoscopy;  Laterality: N/A;   ESOPHAGOGASTRODUODENOSCOPY (EGD) WITH PROPOFOL N/A  11/28/2018   Procedure: ESOPHAGOGASTRODUODENOSCOPY (EGD) WITH PROPOFOL;  Surgeon: [name], MD;  [facility];  Service: Endoscopy;  Laterality: N/A;   ESOPHAGOGASTRODUODENOSCOPY (EGD) WITH PROPOFOL N/A 07/31/2019   Procedure: ESOPHAGOGASTRODUODENOSCOPY (EGD) WITH PROPOFOL;  Surgeon: [name], MD;  [facility]: [facility];  Service: Endoscopy;  Laterality: N/A;   EUS N/A 06/07/2017   Procedure: UPPER ENDOSCOPIC ULTRASOUND (EUS) RADIAL;  Surgeon: [name], MD;  Location: [facility];  Service: Endoscopy;  Laterality: N/A;   FEMORAL-FEMORAL BYPASS GRAFT Bilateral 05/08/2017   Procedure: LEFT TO RIGHT BYPASS GRAFT FEMORAL-FEMORAL ARTERY;  Surgeon: Conrad Strathmore, MD;  Location: [place];  Service: Vascular;  Laterality: Bilateral;   IR FLUORO GUIDE PORT INSERTION RIGHT  07/05/2017   IR US GUIDE VASC ACCESS RIGHT  07/05/2017   LAPAROSCOPIC INSERTION GASTROSTOMY TUBE N/A 11/27/2017   Procedure: JEJUNOSTOMY TUBE PLACEMENT;  Surgeon: [name], MD;  Location: [place];  Service: General;  Laterality: N/A;   LAPAROSCOPIC PARTIAL GASTRECTOMY N/A 11/27/2017   Procedure: PARTIAL GASTRECTOMY;  Surgeon: [name], MD;  Location: [place];  Service: General;  Laterality: N/A;   LAPAROSCOPY N/A 11/27/2017   Procedure: LAPAROSCOPY DIAGNOSTIC;  Surgeon: [name], MD;  Location: [place];  Service: General;  Laterality: N/A;   NM MYOVIEW LTD  06/2017   LOW RISK. Small basal inferoseptal defect thought to be related to artifact (although cannot exclude prior infarct). Normal wall motion i in this area would suggest artifact not infarct. Otherwise no ischemia or infarction.   PERIPHERAL VASCULAR INTERVENTION Left 05/03/2017   Procedure: PERIPHERAL VASCULAR INTERVENTION;  Surgeon: Conrad Riverton, MD;  Location: [place] [facility];  Service: Cardiovascular;  Laterality: Left;  common iliac   POLYPECTOMY  11/28/2018   Procedure: POLYPECTOMY;  Surgeon: [name], MD;  [facility];  Service: Endoscopy;;   TRANSTHORACIC ECHOCARDIOGRAM  06/26/2017   Normal LV size and function. EF 60-65%. No wall motion abnormalities. GR 1 DD. Mild LA dilation    Family History  Family history unknown: Yes    Social History   Socioeconomic History   Marital status: Married    Spouse name: Not on file   Number of children: 4   Years of education: Not on file   Highest education level: Not on file  Occupational History   Not on file  Tobacco Use   Smoking status: Every Day    Packs/day: 1.50    Years: 50.00    Pack years: 75.00    Types: Cigarettes    Last attempt to quit: 11/2017    Years since quitting: 3.1   Smokeless tobacco: Never   Tobacco comments:    10-12 cigarettes/24 hours;using patch  Vaping Use   Vaping Use: Never used  Substance and Sexual Activity   Alcohol use: No    Alcohol/week: 0.0 standard drinks   Drug use: No   Sexual activity: Yes  Other Topics Concern   Not on file  Social History Narrative   Not on file   Social Determinants of Health   Financial Resource Strain: Not on file  Food Insecurity: Not on file  Transportation Needs: Not on file  Physical Activity: Not on file  Stress: Not on file  Social Connections: Not on file  Intimate Partner Violence: Not on file    Review of Systems  Constitutional:  Positive for appetite change. Negative for fatigue.  HENT: Negative.    Eyes: Negative.   Respiratory:  Positive for shortness of breath. Negative for cough, choking and chest tightness.   Cardiovascular: Negative.   Gastrointestinal: Negative.   Endocrine: Negative.   Psychiatric/Behavioral:  Negative for sleep disturbance.        Depression   Vitals:   12/28/20 1035  BP: 120/80  Pulse: 86  Temp: 98 F (36.7 C)  SpO2: 96%     Physical Exam Constitutional:      Appearance: Normal appearance.     Comments: Underweight  HENT:     Head: Normocephalic and atraumatic.     Nose: No congestion.      Mouth/Throat:     Mouth: Mucous membranes are moist.     Pharynx: No oropharyngeal exudate or posterior oropharyngeal erythema.  Eyes:     General: No scleral icterus.    Extraocular Movements: Extraocular movements intact.     Conjunctiva/sclera: Conjunctivae normal.     Pupils: Pupils are equal, round, and reactive to light.  Neck:     Thyroid: No thyromegaly.     Trachea: No tracheal deviation.  Cardiovascular:     Rate and Rhythm: Normal rate and regular rhythm.     Heart sounds: No murmur heard.   No friction rub.  Pulmonary:     Effort: No respiratory distress.     Breath sounds: No stridor. No wheezing.     Comments: Decreased breath sounds bilaterally Musculoskeletal:     Cervical back: No rigidity or tenderness.  Neurological:     Mental Status: He is alert.  Psychiatric:        Mood and Affect: Mood normal.   Data Reviewed:  Recent records reviewed  Assessment:   .  Advanced chronic obstructive pulmonary disease .  Chronic respiratory failure -Encouraged to continue Breztri -Continue with nebulization treatment with DuoNeb  .  An active smoker -Continue to encourage to quit smoking   .  Oxygen qualification in the office today -Did desaturate up to 79% with ambulation .  Was placed on 3 L and was able to maintain over 90% with ambulation .  When patient initially came into the office, he was 81% off of oxygen  .  History of gastric cancer -Continues to follow-up with oncology  .  History of depression  .  History of anorexia  Plan/Recommendations:   .  Continue inhalers  .  Continue oxygen supplementation  .  Send prescription for DuoNeb  .  Smoking cessation counseling  .  Tentative follow-up in 3 months  .  Patient states he will be traveling to [place] in the next few months and does require oxygen supplementation   .-Trying to get him qualified for portable concentrator  .  I spent 30 minutes dedicated to the care of this patient on  the date of this encounter to include previsit review of records, face-to-face time with the patient discussing conditions above, post visit ordering of testing, clinical documentation with electronic health record and communicated necessary findings to members of the patient's care team I will  Sherrilyn Rist MD [facility] 12/28/2020, 10:52 AM  CC: Bobby Merle, MD

## 2020-12-28 NOTE — Patient Instructions (Signed)
I will see you in 3 months  We will follow-up with your medical supply company to try and get you call qualified for portable concentrator  Refills for DuoNeb will be sent to pharmacy   Call with significant concerns  Continue using your oxygen on a regular basis even prior to being able to get the portable concentrator

## 2021-01-04 NOTE — Telephone Encounter (Signed)
Pt was seen on 6/21 by AO. Will close encounter.

## 2021-01-07 NOTE — Telephone Encounter (Signed)
Checked PA status this morning and the PA was still pending. I called [facility] and spoke with the pharmacist. She stated that the patient had been able to pick up his medication on 12/30/20. Will close this encounter.

## 2021-01-14 ENCOUNTER — Inpatient Hospital Stay: Payer: Medicare Other

## 2021-01-14 ENCOUNTER — Telehealth: Payer: Self-pay

## 2021-01-14 ENCOUNTER — Inpatient Hospital Stay: Payer: Medicare Other | Attending: Hematology | Admitting: Hematology

## 2021-01-14 ENCOUNTER — Telehealth: Payer: Self-pay | Admitting: Hematology

## 2021-01-14 NOTE — Telephone Encounter (Signed)
R/s appts per 7/8 sch msg. Called pt, no answer. Left msg with appts date and times.

## 2021-01-14 NOTE — Telephone Encounter (Signed)
Attempted to contact patient in reference to their scheduled appointments for today. Unable to leave a voicemail on mailbox at this time. Dr. Burr Medico made aware.

## 2021-01-21 ENCOUNTER — Inpatient Hospital Stay: Payer: Medicare Other

## 2021-02-01 NOTE — Progress Notes (Signed)
[employer] has received your information, and the request will be reviewed. You may close this dialog, return to your dashboard, and perform other tasks.  You will receive an electronic determination in CoverMyMeds. You can see the latest determination by locating this request on your dashboard or by reopening this request. You will also receive a faxed copy of the determination. If you have any questions please contact [employer] at [phone].  If you need assistance, please chat with CoverMyMeds or call us at [phone].

## 2021-02-09 ENCOUNTER — Telehealth: Payer: Self-pay | Admitting: Hematology

## 2021-02-09 NOTE — Telephone Encounter (Signed)
Scheduled appts per 8/3 sch msg. Called pt, no answer. Left msg using interpreter line with appts date and time.

## 2021-02-18 ENCOUNTER — Inpatient Hospital Stay: Payer: Medicare Other | Attending: Hematology

## 2021-02-18 ENCOUNTER — Inpatient Hospital Stay: Payer: Medicare Other | Admitting: Hematology

## 2021-02-18 DIAGNOSIS — J449 Chronic obstructive pulmonary disease, unspecified: Secondary | ICD-10-CM | POA: Insufficient documentation

## 2021-02-18 DIAGNOSIS — D519 Vitamin B12 deficiency anemia, unspecified: Secondary | ICD-10-CM | POA: Insufficient documentation

## 2021-02-18 DIAGNOSIS — F419 Anxiety disorder, unspecified: Secondary | ICD-10-CM | POA: Insufficient documentation

## 2021-02-18 DIAGNOSIS — F17210 Nicotine dependence, cigarettes, uncomplicated: Secondary | ICD-10-CM | POA: Insufficient documentation

## 2021-02-18 DIAGNOSIS — F32A Depression, unspecified: Secondary | ICD-10-CM | POA: Insufficient documentation

## 2021-02-18 DIAGNOSIS — Z9981 Dependence on supplemental oxygen: Secondary | ICD-10-CM | POA: Insufficient documentation

## 2021-03-03 ENCOUNTER — Other Ambulatory Visit: Payer: Self-pay

## 2021-03-03 ENCOUNTER — Encounter: Payer: Self-pay | Admitting: Nurse Practitioner

## 2021-03-03 ENCOUNTER — Inpatient Hospital Stay (HOSPITAL_BASED_OUTPATIENT_CLINIC_OR_DEPARTMENT_OTHER): Payer: Medicare Other | Admitting: Nurse Practitioner

## 2021-03-03 ENCOUNTER — Inpatient Hospital Stay: Payer: Medicare Other

## 2021-03-03 VITALS — BP 121/76 | HR 89 | Temp 97.6°F | Resp 18 | Ht 65.0 in | Wt 87.9 lb

## 2021-03-03 DIAGNOSIS — F17210 Nicotine dependence, cigarettes, uncomplicated: Secondary | ICD-10-CM | POA: Diagnosis not present

## 2021-03-03 DIAGNOSIS — D508 Other iron deficiency anemias: Secondary | ICD-10-CM

## 2021-03-03 DIAGNOSIS — F419 Anxiety disorder, unspecified: Secondary | ICD-10-CM | POA: Diagnosis not present

## 2021-03-03 DIAGNOSIS — E538 Deficiency of other specified B group vitamins: Secondary | ICD-10-CM

## 2021-03-03 DIAGNOSIS — D519 Vitamin B12 deficiency anemia, unspecified: Secondary | ICD-10-CM | POA: Diagnosis present

## 2021-03-03 DIAGNOSIS — C162 Malignant neoplasm of body of stomach: Secondary | ICD-10-CM

## 2021-03-03 DIAGNOSIS — Z95828 Presence of other vascular implants and grafts: Secondary | ICD-10-CM

## 2021-03-03 DIAGNOSIS — C169 Malignant neoplasm of stomach, unspecified: Secondary | ICD-10-CM

## 2021-03-03 DIAGNOSIS — F32A Depression, unspecified: Secondary | ICD-10-CM | POA: Diagnosis not present

## 2021-03-03 DIAGNOSIS — J449 Chronic obstructive pulmonary disease, unspecified: Secondary | ICD-10-CM | POA: Diagnosis not present

## 2021-03-03 DIAGNOSIS — Z9981 Dependence on supplemental oxygen: Secondary | ICD-10-CM | POA: Diagnosis not present

## 2021-03-03 LAB — CBC WITH DIFFERENTIAL/PLATELET
Abs Immature Granulocytes: 0.01 10*3/uL (ref 0.00–0.07)
Basophils Absolute: 0.1 10*3/uL (ref 0.0–0.1)
Basophils Relative: 1 %
Eosinophils Absolute: 0 10*3/uL (ref 0.0–0.5)
Eosinophils Relative: 1 %
HCT: 38.6 % — ABNORMAL LOW (ref 39.0–52.0)
Hemoglobin: 12.4 g/dL — ABNORMAL LOW (ref 13.0–17.0)
Immature Granulocytes: 0 %
Lymphocytes Relative: 26 %
Lymphs Abs: 1.5 10*3/uL (ref 0.7–4.0)
MCH: 30 pg (ref 26.0–34.0)
MCHC: 32.1 g/dL (ref 30.0–36.0)
MCV: 93.5 fL (ref 80.0–100.0)
Monocytes Absolute: 0.4 10*3/uL (ref 0.1–1.0)
Monocytes Relative: 7 %
Neutro Abs: 3.8 10*3/uL (ref 1.7–7.7)
Neutrophils Relative %: 65 %
Platelets: 131 10*3/uL — ABNORMAL LOW (ref 150–400)
RBC: 4.13 MIL/uL — ABNORMAL LOW (ref 4.22–5.81)
RDW: 13 % (ref 11.5–15.5)
WBC: 5.8 10*3/uL (ref 4.0–10.5)
nRBC: 0 % (ref 0.0–0.2)

## 2021-03-03 LAB — COMPREHENSIVE METABOLIC PANEL
ALT: 13 U/L (ref 0–44)
AST: 13 U/L — ABNORMAL LOW (ref 15–41)
Albumin: 3.7 g/dL (ref 3.5–5.0)
Alkaline Phosphatase: 64 U/L (ref 38–126)
Anion gap: 5 (ref 5–15)
BUN: 10 mg/dL (ref 8–23)
CO2: 40 mmol/L — ABNORMAL HIGH (ref 22–32)
Calcium: 8.8 mg/dL — ABNORMAL LOW (ref 8.9–10.3)
Chloride: 95 mmol/L — ABNORMAL LOW (ref 98–111)
Creatinine, Ser: 0.52 mg/dL — ABNORMAL LOW (ref 0.61–1.24)
GFR, Estimated: >60 mL/min (ref >60)
Glucose, Bld: 97 mg/dL (ref 70–99)
Potassium: 4 mmol/L (ref 3.5–5.1)
Sodium: 140 mmol/L (ref 135–145)
Total Bilirubin: 0.5 mg/dL (ref 0.3–1.2)
Total Protein: 6.1 g/dL — ABNORMAL LOW (ref 6.5–8.1)

## 2021-03-03 LAB — FERRITIN: Ferritin: 38 ng/mL (ref 24–336)

## 2021-03-03 LAB — VITAMIN B12: Vitamin B-12: 241 pg/mL (ref 180–914)

## 2021-03-03 MED ORDER — HEPARIN SOD (PORK) LOCK FLUSH 100 UNIT/ML IV SOLN
500.0000 [IU] | Freq: Once | INTRAVENOUS | Status: AC
  Administered 2021-03-03: 500 [IU]

## 2021-03-03 MED ORDER — MEGESTROL ACETATE 625 MG/5ML PO SUSP: 625.0000 mg | Freq: Every day | ORAL | 0 refills | Status: DC

## 2021-03-03 MED ORDER — SODIUM CHLORIDE 0.9% FLUSH
10.0000 mL | Freq: Once | INTRAVENOUS | Status: AC
  Administered 2021-03-03: 10 mL

## 2021-03-03 MED ORDER — CYANOCOBALAMIN 1000 MCG/ML IJ SOLN
1000.0000 ug | Freq: Once | INTRAMUSCULAR | Status: AC
  Administered 2021-03-03: 1000 ug via INTRAMUSCULAR
  Filled 2021-03-03: qty 1

## 2021-03-03 NOTE — Progress Notes (Signed)
[place]   Telephone:([phone]) [phone] Fax:(336) [phone]   Clinic Follow up Note   Patient Care Team: [name], MD as PCP - General (Hematology) [name], MD as Consulting Physician (Pulmonary Disease) [name], MD as Attending Physician (Gastroenterology) [name], MD as Consulting Physician (General Surgery) Laurin Coder, MD as Consulting Physician (Pulmonary Disease) Date of service: 03/03/2021   CHIEF COMPLAINT: Follow-up gastric cancer  SUMMARY OF ONCOLOGIC HISTORY: Oncology History Overview Note  Cancer Staging Gastric cancer [facility]) Staging form: Stomach, AJCC 8th Edition - Clinical stage from 05/24/2017: Stage I (cT2, cN0, cM0) - Signed by [name], MD on 06/19/2017 - Pathologic stage from 11/27/2017: Stage I (ypT1b, pN0, cM0) - Signed by [name], MD on 04/04/2018     Gastric adenocarcinoma ([place])  05/24/2017 Procedure   EGD by Dr. [name] 05/24/17 IMPRESSION Ulcerated mass like region in the distal stomach (4cm across), extensively biopsied. This appears neoplastic. - Abnormal mucosa throughout stomach otherwise (mild-moderate gastritis), also extensively biopsied.   05/24/2017 Initial Biopsy   Diagnosis 05/24/17  1. Stomach, biopsy, mass distal - ADENOCARCINOMA, SEE COMMENT. - CHRONIC ACTIVE GASTRITIS WITH INTESTINAL METAPLASIA. 2. 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 - NO DYSPLASIA OR MALIGNANCY   06/04/2017 Initial Diagnosis   Gastric cancer ([place])   06/07/2017 Procedure   EUS by Dr. [name] on 06/07/17  IMPRESSION:  5cm across, partially circumferential uT2N0 gastric adenocarcinoma laying in the distal stomach 3-4cm proximal to the pylorus.   06/22/2017 Imaging   CT CAP W Contrast 06/22/17  IMPRESSION: 1. The gastric mass is somewhat inconspicuous on CT, but a potential 1.2 cm thick mucosal masses seen along the posterior gastric  border on image 83/2. There several small adjacent right gastric lymph nodes measuring up to 0.7 cm in short axis, but no overtly pathologic adenopathy and no findings of hepatic or peritoneal metastatic disease at this time. 2. Calcified granulomas in both lungs along with several noncalcified nodules stable from August 2016 and likely benign given this long-term stability. 3. Other imaging findings of potential clinical significance: Aortic Atherosclerosis (ICD10-I70.0) and Emphysema (ICD10-J43.9). Airway thickening is present, suggesting bronchitis or reactive airways disease. 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 Disc bulges at L3-4 and L4-5.   07/05/2017 Surgery   PAC placement    07/07/2017 - 09/06/2017 Chemotherapy   Neoadjuvant FOLFOX with with leucovorin every 2 weeks. Plan for 6 cycles but stopped after 4 cycles due to poor tolerance     09/10/2017 Imaging    CT AP W Contrast 09/10/17 IMPRESSION: 1. No acute findings identified within the abdomen or pelvis. 2. Interval development of multiple bilateral pulmonary densities within both lower lobes. These are indeterminate, likely post infectious or inflammatory in etiology. Atypical infection not excluded. Metastatic disease is considered less favored but not entirely excluded and follow-up imaging to ensure resolution is advised. 3. No evidence to suggest recurrent tumor or metastatic disease within the abdomen or pelvis. 4.  Aortic Atherosclerosis (ICD10-I70.0).   11/27/2017 Surgery    Surgery He had partial gastrectomy with Dr. [name] on 11/27/2017.   11/27/2017 Pathology Results   11/27/2017 Surgical Pathology Diagnosis 1. Stomach, resection for tumor, Distal w/ Omentum - ADENOCARCINOMA, MODERATELY DIFFERENTIATED, 3.8 CM - LYMPHOVASCULAR SPACE AND PERINEURAL INVASION - NO CARCINOMA IDENTIFIED IN FOUR LYMPH NODES (0/4) - SEE ONCOLOGY TABLE  AND COMMENT BELOW 2. Lymph node,  biopsy, Portal - NO CARCINOMA IDENTIFIED IN ONE LYMPH NODE (0/1) - SEE COMMENT 3. Lymph node, biopsy, Left Gastric - NO CARCINOMA IDENTIFIED IN TWO LYMPH NODES (0/2) - SEE COMMENT 4. Omentum, resection for tumor - BENIGN FIBROADIPOSE TISSUE - NO CARCINOMA IDENTIFIED    11/27/2017 Cancer Staging   Staging form: Stomach, AJCC 8th Edition - Pathologic stage from 11/27/2017: Stage I (ypT1b, pN0, cM0) - Signed by [name], MD on 04/04/2018   08/14/2018 Imaging   CT CAP 08/14/18 IMPRESSION:  1. No findings of active malignancy. 2. Generally stable pulmonary nodules are identified in the lungs. The only new nodule seen today is a 3 mm right apical nodule on image 32/4 which may merit surveillance. Many of the basilar nodules shown on the prior CT abdomen from 09/10/2017 have resolved. 3. Other imaging findings of potential clinical significance: Aortic Atherosclerosis (ICD10-I70.0). Emphysema (ICD10-J43.9). Lumbar degenerative disc disease.   08/22/2018 Procedure   Upper endoscopy 08/22/18 by Dr Verdie Drown The esophagus was slightly tortuous but there were no strictures/stenosis and the mucosa was normal throughout. The gastro-jejunostomy site was widely patent, Bilroth II anatomy. There was a 1.5-2cm region of the anastomosis that was discretely abnormal, soft but somewhat neoplastic appearing and nodular. I biopsied this region extensively. Diagnosis Stomach, biopsy, GJ anastomosis erythema - LOW GRADE GLANDULAR DYSPLASIA. - GOBLET CELL METAPLASIA. - SEE COMMENT.   11/28/2018 Procedure   Upper Endoscopy 11/28/18 by Dr Verdie Drown - Bilroth II anatomy. The previously noted adenomatous polyp was again located at the [place] anastomosis. It was soft, sessile, about 1.9cm across in greatest dimension. I used mixed cold/cautery snare resection to (apparently) completely remove the polyp. Diagnosis Stomach, polyp(s) - LOW GRADE GLANDULAR  DYSPLASIA. - GOBLET CELL METAPLASIA.   07/31/2019 Procedure   Upper Endoscopy by Dr. [name] 07/31/19  IMPRESSION - Typical Bilroth II anatomy. The GJ anastomosis was slightly inflammed, but not overtly neoplastic appearing. No clear recurrent or residual polyps. The anastomosis was sampled at the sight of most significant appearing inflammation (1-2cm section, see images). - Afferent and efferent limbs were intubated briefly and appeared normal. - Mild candida esophagitis, not sampled. Will start empiric diflucan course ('100mg'$  daily for 10 days) - The examination was otherwise normal. FINAL MICROSCOPIC DIAGNOSIS:   A.  IRREGULAR MUCOSA AT GJ JUNCTION, BIOPSY:  -Anastomosis site with peptic change and low grade dysplasia.    04/01/2020 Imaging   CT CAP IMPRESSION: 1. Redemonstrated postoperative findings of distal gastrectomy and gastric jejunostomy. No evidence of malignant recurrence. 2. There is incidental note of intussusception of the jejunum into the gastric lumen, likely transient and peristaltic in nature. 3. Stable small pulmonary nodules scattered throughout the lungs. Attention on follow-up. 4. Additional scattered, clustered centrilobular and tree-in-bud nodules in the dependent right lower lobe, most consistent with aspiration given the presence of frothy debris in the airways. 5. Severe emphysema and diffuse bilateral bronchial wall thickening. Emphysema (ICD10-J43.9). 6. Coronary artery disease. 7. 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 Aortic Atherosclerosis (ICD10-I70.0).     CURRENT THERAPY:  -Gastric cancer Surveillance -IV Venofer since 11/09/20 as needed  -B12 injection since 11/09/20, weekly x5 weeks then monthly  INTERVAL HISTORY: Bobby Caldwell presents with his wife in a wheelchair for follow-up as scheduled.  [place] language was not an  option through our interpreter service, his daughter Tawnya Crook kindly interpreted for [place] over the  phone.  He was last seen by Dr. Burr Medico 11/26/2020.  He completed 3 of 5 recommended weekly B12 injections, last given 12/24/2020.  He has not had IV iron in the interim.  He is not doing well with his weight or appetite, he lost more weight maybe 10 pounds.  He thinks he took mirtazapine before but not sure now, also not sure if this helped.  He is out of supplements.  He does not drink well, he is dizzy on standing at times.  Denies change in bowel habits, nausea/vomiting, abdominal pain or bloating, bleeding.  He has a chronic cough and phlegm he cannot get up, on 4 L oxygen 24 hours a day.  He is on respiratory meds every 4 hours and needs a refill.  He is scheduled to see pulmonologist next week.  He walks around at home with O2 tank, but does not get out in public much.  All other systems were reviewed with the patient and are negative.  MEDICAL HISTORY:  Past Medical History:  Diagnosis Date   COPD (chronic obstructive pulmonary disease) ([place])    Dyspnea    PAD (peripheral artery disease) ([facility])    Pneumothorax    Stomach cancer ([place])    Supplemental oxygen dependent    3.5-4 L   Tobacco abuse    Weight loss     SURGICAL HISTORY: Past Surgical History:  Procedure Laterality Date   ABDOMINAL AORTOGRAM W/LOWER EXTREMITY N/A 05/03/2017   Procedure: ABDOMINAL AORTOGRAM W/LOWER EXTREMITY;  Surgeon: Conrad [place], MD;  Location: [facility];  Service: Cardiovascular;  Laterality: N/A;   BIOPSY  08/22/2018   Procedure: BIOPSY;  Surgeon: [name], MD;  Location: [facility];  Service: Endoscopy;;   BIOPSY  07/31/2019   Procedure: BIOPSY;  Surgeon: [name], MD;  Location: [facility];  Service: Endoscopy;;   ESOPHAGOGASTRODUODENOSCOPY     05/24/17   ESOPHAGOGASTRODUODENOSCOPY (EGD) WITH PROPOFOL N/A 05/24/2017   Procedure: ESOPHAGOGASTRODUODENOSCOPY (EGD) WITH PROPOFOL;   Surgeon: [name], MD;  [facility];  Service: Endoscopy;  Laterality: N/A;   ESOPHAGOGASTRODUODENOSCOPY (EGD) WITH PROPOFOL N/A 08/22/2018   Procedure: ESOPHAGOGASTRODUODENOSCOPY (EGD) WITH PROPOFOL;  Surgeon: [name], MD;  [facility];  Service: Endoscopy;  Laterality: N/A;   ESOPHAGOGASTRODUODENOSCOPY (EGD) WITH PROPOFOL N/A 11/28/2018   Procedure: ESOPHAGOGASTRODUODENOSCOPY (EGD) WITH PROPOFOL;  Surgeon: [name], MD;  [facility];  Service: Endoscopy;  Laterality: N/A;   ESOPHAGOGASTRODUODENOSCOPY (EGD) WITH PROPOFOL N/A 07/31/2019   Procedure: ESOPHAGOGASTRODUODENOSCOPY (EGD) WITH PROPOFOL;  Surgeon: [name], MD;  [facility];  Service: Endoscopy;  Laterality: N/A;   EUS N/A 06/07/2017   Procedure: UPPER ENDOSCOPIC ULTRASOUND (EUS) RADIAL;  Surgeon: [name], MD;  [facility];  Service: Endoscopy;  Laterality: N/A;   FEMORAL-FEMORAL BYPASS GRAFT Bilateral 05/08/2017   Procedure: LEFT TO RIGHT BYPASS GRAFT FEMORAL-FEMORAL ARTERY;  Surgeon: Conrad [place], MD;  Location: [place];  Service: Vascular;  Laterality: Bilateral;   IR FLUORO GUIDE PORT INSERTION RIGHT  07/05/2017   IR US GUIDE VASC ACCESS RIGHT  07/05/2017   LAPAROSCOPIC INSERTION GASTROSTOMY TUBE N/A 11/27/2017   Procedure: JEJUNOSTOMY TUBE PLACEMENT;  Surgeon: [name], MD;  Location: [place];  Service: General;  Laterality: N/A;   LAPAROSCOPIC PARTIAL GASTRECTOMY N/A 11/27/2017   Procedure: PARTIAL GASTRECTOMY;  Surgeon: [name], MD;  Location: [place];  Service: General;  Laterality: N/A;   LAPAROSCOPY N/A 11/27/2017   Procedure: LAPAROSCOPY  DIAGNOSTIC;  Surgeon: [name], MD;  Location: [place];  Service: General;  Laterality: N/A;   NM MYOVIEW LTD  06/2017   LOW RISK. Small basal inferoseptal defect thought to be related to artifact (although cannot exclude prior infarct). Normal wall motion i in this area would suggest artifact  not infarct. Otherwise no ischemia or infarction.   PERIPHERAL VASCULAR INTERVENTION Left 05/03/2017   Procedure: PERIPHERAL VASCULAR INTERVENTION;  Surgeon: Conrad [place], MD;  Location: [facility];  Service: Cardiovascular;  Laterality: Left;  common iliac   POLYPECTOMY  11/28/2018   Procedure: POLYPECTOMY;  Surgeon: [name], MD;  [facility];  Service: Endoscopy;;   TRANSTHORACIC ECHOCARDIOGRAM  06/26/2017   Normal LV size and function. EF 60-65%. No wall motion abnormalities. GR 1 DD. Mild LA dilation    I have reviewed the social history and family history with the patient and they are unchanged from previous note.  ALLERGIES:  has No Known Allergies.  MEDICATIONS:  Current Outpatient Medications  Medication Sig Dispense Refill   Budeson-Glycopyrrol-Formoterol (BREZTRI AEROSPHERE) 160-9-4.8 MCG/ACT AERO Inhale 2 puffs into the lungs 2 (two) times daily. 5.9 g 0   megestrol (MEGACE ES) 625 MG/5ML suspension Take 5 mLs (625 mg total) by mouth daily. 150 mL 0   midodrine (PROAMATINE) 5 MG tablet Take 1 tablet (5 mg total) by mouth 3 (three) times daily with meals. 90 tablet 0   Multiple Vitamin (MULTIVITAMIN WITH MINERALS) TABS tablet Take 1 tablet by mouth daily. 30 tablet 0   ondansetron (ZOFRAN) 4 MG tablet Take 1 tablet (4 mg total) by mouth every 8 (eight) hours as needed for nausea or vomiting. 15 tablet 0   OXYGEN Inhale 4 L into the lungs continuous.     feeding supplement, ENSURE ENLIVE, (ENSURE ENLIVE) LIQD Take 237 mLs by mouth 2 (two) times daily between meals. 14222 mL 0   ipratropium-albuterol (DUONEB) 0.5-2.5 (3) MG/3ML SOLN Take 3 mLs by nebulization every 4 (four) hours as needed. Dx J44.9 360 mL 5   No current facility-administered medications for this visit.    PHYSICAL EXAMINATION:  Vitals:   03/03/21 0902  BP: 121/76  Pulse: 89  Resp: 18  Temp: 97.6 F (36.4 C)  SpO2: 100%   Filed Weights   03/03/21 0902  Weight: 87 lb 14.4 oz  (39.9 kg)    GENERAL:alert, frail-appearing gentleman in no distress  SKIN: No rash EYES: sclera clear NECK: Without mass LUNGS: Diminished, with normal breathing effort HEART: regular rate & rhythm, no lower extremity edema ABDOMEN:abdomen soft, non-tender and normal bowel sounds NEURO: alert & oriented x 3 with fluent speech PAC without erythema  LABORATORY DATA:  I have reviewed the data as listed CBC Latest Ref Rng & Units 03/03/2021 12/03/2020 11/10/2020  WBC 4.0 - 10.5 K/uL 5.8 5.0 10.4  Hemoglobin 13.0 - 17.0 g/dL 12.4(L) 13.1 10.8(L)  Hematocrit 39.0 - 52.0 % 38.6(L) 41.3 35.0(L)  Platelets 150 - 400 K/uL 131(L) 148(L) 123(L)     CMP Latest Ref Rng & Units 03/03/2021 12/03/2020 11/10/2020  Glucose 70 - 99 mg/dL 97 87 72  BUN 8 - 23 mg/dL '10 9 10  '$ Creatinine 0.61 - 1.24 mg/dL 0.52(L) 0.52(L) 0.40(L)  Sodium 135 - 145 mmol/L 140 143 141  Potassium 3.5 - 5.1 mmol/L 4.0 4.3 4.2  Chloride 98 - 111 mmol/L 95(L) 98 107  CO2 22 - 32 mmol/L 40(H) 38(H) 30  Calcium 8.9 - 10.3 mg/dL 8.8(L) 8.9 7.4(L)  Total Protein 6.5 - 8.1 g/dL 6.1(L) 6.5 -  Total Bilirubin 0.3 - 1.2 mg/dL 0.5 0.4 -  Alkaline Phos 38 - 126 U/L 64 81 -  AST 15 - 41 U/L 13(L) 13(L) -  ALT 0 - 44 U/L 13 11 -      RADIOGRAPHIC STUDIES: I have personally reviewed the radiological images as listed and agreed with the findings in the report. No results found.   ASSESSMENT & PLAN: Bobby Caldwell is a 65 y.o. male with      1. Gastric Adenocarcinoma, uT2N0M0, stage I, ypT1bN0 -Diagnosed in 05/2017. Treated with neoadjuvant chemo and surgery. Currently on surveillance. -His Upper Endoscopy with Dr [name] from 07/31/19 shows no evidence of recurrence, mild esophagitis, which was treated with fluconazole. CT CAP form 08/18/19 and 04/01/20 showed NED -Bobby Caldwell is clinically doing well except chronically low appetite and weight loss. He is chronically frail but exam is otherwise benign.  -In the setting of chronic low appetite  and otherwise normal exam, my suspicion for recurrence is low.  -continue surveillance and close f/up, next scan 03/2021 -f/up in 3 months     2. Anemia with iron and B12 deficiency  -secondary to gastric cancer resection surgery -B12 injections and IV Venofer started 11/09/20, he was instructed to do 8 weekly injections then monthly, but he did not complete.  -03/03/21 ferritin 38 and B12 241 with hgb 12.4 and plt 131, will resume monthly B12. Given ferritin <50 with mild anemia, I will give IV venofer x1 and schedule more if needed   3. Abdominal pain, weight loss, fatigue  -he had abdominal pain with weight loss and fatigue in 03/2020, CT showed no evidence of recurrence  -did not tolerate tramadol or mirtazapine. We recently tried mirtazapine again 11/2020 but he did not take it consistently and/or it was not helpful, unclear which.  -megace was helpful in the past, he has lost weight again. Will restart.    4. COPD, Smoking Cessation  -on continuous O2, f/u with pulmonologist Dr. Ander Slade   5. Depression and or anxiety -on Ativan PRN in the past. -tried mirtazapine in 2021 and again recently but not sure if he took it consistently or if it was effective. Ok to stop -stable   PLAN: -discontinue mirtazapine -start megace for appetite -Surveillance CT CAP in 1-3 weeks, will call with results  -lab/B12 injection monthly x3 -IV Venofer x1 with next B12 inj -F/up in 3 months, or sooner if needed  -F/up with pulmonology as scheduled   Orders Placed This Encounter  Procedures   CT CHEST ABDOMEN PELVIS W CONTRAST    Standing Status:   Future    Standing Expiration Date:   03/08/2022    Order Specific Question:   If indicated for the ordered procedure, I authorize the administration of contrast media per Radiology protocol    Answer:   Yes    Order Specific Question:   Preferred imaging location?    Answer:   [facility]    Order Specific Question:   Is Oral Contrast requested for  this exam?    Answer:   Yes, Per Radiology protocol    Order Specific Question:   Reason for Exam (SYMPTOM  OR DIAGNOSIS REQUIRED)    Answer:   surveillance   Ambulatory Referral to [facility] Nutrition    Referral Priority:   Routine    Referral Type:   Consultation    Referral Reason:   Specialty Services Required  Number of Visits Requested:   1   All questions were answered. The patient knows to call the clinic with any problems, questions or concerns. No barriers to learning were detected. Total encounter time was 30 minutes.      Alla Feeling, NP 03/08/21

## 2021-03-04 ENCOUNTER — Telehealth: Payer: Self-pay | Admitting: Nurse Practitioner

## 2021-03-04 NOTE — Telephone Encounter (Signed)
Scheduled appt per 8/25 sch msg. Called pt, no answer. Left msg with appt date and time.

## 2021-03-07 ENCOUNTER — Other Ambulatory Visit: Payer: Self-pay

## 2021-03-07 ENCOUNTER — Encounter: Payer: Self-pay | Admitting: Pulmonary Disease

## 2021-03-07 ENCOUNTER — Ambulatory Visit (INDEPENDENT_AMBULATORY_CARE_PROVIDER_SITE_OTHER): Payer: Medicare Other | Admitting: Pulmonary Disease

## 2021-03-07 VITALS — BP 88/50 | HR 97 | Temp 98.3°F | Ht 65.0 in | Wt 87.0 lb

## 2021-03-07 DIAGNOSIS — R0602 Shortness of breath: Secondary | ICD-10-CM

## 2021-03-07 DIAGNOSIS — J9611 Chronic respiratory failure with hypoxia: Secondary | ICD-10-CM

## 2021-03-07 DIAGNOSIS — J432 Centrilobular emphysema: Secondary | ICD-10-CM

## 2021-03-07 MED ORDER — IPRATROPIUM-ALBUTEROL 0.5-2.5 (3) MG/3ML IN SOLN: 3.0000 mL | RESPIRATORY_TRACT | 5 refills | Status: DC | PRN

## 2021-03-07 NOTE — Progress Notes (Signed)
Bobby Caldwell    AM:[id number]    [birth year]  Primary Care Physician:[name], Krista Blue, MD  Referring Physician: [name], MD [address] [place],  [place] [address]  Chief complaint:   Follow-up for obstructive lung disease  HPI: Patient with chronic respiratory failure On oxygen around-the-clock  Uses DuoNeb regularly Weight loss Decreased appetite  Recently had a significant nosebleed  Feels more limited with having to drag a tank of oxygen, feels he will benefit from using portable concentrator  He does have an occasional cough with minimal secretions Has not been feeling acutely ill  continues to smoke about 8 cigarettes a day  Continues to use DuoNeb about every 3-4 hours Supposed to be on Breztri,-has not been using it regularly  He continues to use oxygen supplementation at night  Shortness of breath with exertion   History of stomach cancer Decreased appetite Still has cough with shortness of breath  Has been using oxygen around-the-clock  Has severe COPD/emphysema  Poor activity levels, decreased appetite for which he is on Megace  Started smoking at age 22, greater than 80-pack-year smoking history currently smoking about half a pack per day Was able to quit for a while but currently back smoking actively  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   feeding supplement, ENSURE ENLIVE, (ENSURE ENLIVE) LIQD Take 237 mLs by mouth 2 (two) times daily between meals.   megestrol (MEGACE ES) 625 MG/5ML suspension Take 5 mLs (625 mg total) by mouth daily.   midodrine (PROAMATINE) 5 MG tablet Take 1 tablet (5 mg  total) by mouth 3 (three) times daily with meals.   Multiple Vitamin (MULTIVITAMIN WITH MINERALS) TABS tablet Take 1 tablet by mouth daily.   ondansetron (ZOFRAN) 4 MG tablet Take 1 tablet (4 mg total) by mouth every 8 (eight) hours as needed for nausea or vomiting.   OXYGEN Inhale 4 L into the lungs continuous.   [DISCONTINUED] ipratropium-albuterol (DUONEB) 0.5-2.5 (3) MG/3ML SOLN Take 3 mLs by nebulization every 4 (four) hours as needed. Dx J44.9   ipratropium-albuterol (DUONEB) 0.5-2.5 (3) MG/3ML SOLN Take 3 mLs by nebulization every 4 (four) hours as needed. Dx J44.9   No facility-administered encounter medications on file as of 03/07/2021.    Allergies as of 03/07/2021   (No Known Allergies)    Past Medical History:  Diagnosis Date   COPD (chronic obstructive pulmonary disease) (HCC)    Dyspnea    PAD (peripheral artery disease) (HCC)    Pneumothorax    Stomach cancer (HCC)    Supplemental oxygen dependent    3.5-4 L   Tobacco abuse    Weight loss     Past Surgical History:  Procedure Laterality Date   ABDOMINAL AORTOGRAM W/LOWER EXTREMITY N/A 05/03/2017   Procedure: ABDOMINAL AORTOGRAM W/LOWER EXTREMITY;  Surgeon: Conrad Pinedale, MD;  Location: [facility];  Service: Cardiovascular;  Laterality: N/A;   BIOPSY  08/22/2018   Procedure: BIOPSY;  Surgeon: [name], MD;  Location: [facility];  Service: Endoscopy;;   BIOPSY  07/31/2019   Procedure: BIOPSY;  Surgeon: [name], MD;  Location: [facility];  Service: Endoscopy;;   ESOPHAGOGASTRODUODENOSCOPY  05/24/17   ESOPHAGOGASTRODUODENOSCOPY (EGD) WITH PROPOFOL N/A 05/24/2017   Procedure: ESOPHAGOGASTRODUODENOSCOPY (EGD) WITH PROPOFOL;  Surgeon: [name], MD;  Location: [facility];  Service: Endoscopy;  Laterality: N/A;   ESOPHAGOGASTRODUODENOSCOPY (EGD) WITH PROPOFOL N/A 08/22/2018   Procedure: ESOPHAGOGASTRODUODENOSCOPY (EGD) WITH PROPOFOL;  Surgeon: [name], MD;  Location: [facility];  Service: Endoscopy;  Laterality: N/A;   ESOPHAGOGASTRODUODENOSCOPY (EGD) WITH PROPOFOL N/A 11/28/2018   Procedure: ESOPHAGOGASTRODUODENOSCOPY (EGD) WITH PROPOFOL;  Surgeon: [name], MD;  Location: [facility];  Service: Endoscopy;  Laterality: N/A;   ESOPHAGOGASTRODUODENOSCOPY (EGD) WITH PROPOFOL N/A 07/31/2019   Procedure: ESOPHAGOGASTRODUODENOSCOPY (EGD) WITH PROPOFOL;  Surgeon: [name], MD;  Location: [facility];  Service: Endoscopy;  Laterality: N/A;   EUS N/A 06/07/2017   Procedure: UPPER ENDOSCOPIC ULTRASOUND (EUS) RADIAL;  Surgeon: [name], MD;  Location: [facility];  Service: Endoscopy;  Laterality: N/A;   FEMORAL-FEMORAL BYPASS GRAFT Bilateral 05/08/2017   Procedure: LEFT TO RIGHT BYPASS GRAFT FEMORAL-FEMORAL ARTERY;  Surgeon: Conrad Trowbridge, MD;  Location: [facility];  Service: Vascular;  Laterality: Bilateral;   IR FLUORO GUIDE PORT INSERTION RIGHT  07/05/2017   IR US GUIDE VASC ACCESS RIGHT  07/05/2017   LAPAROSCOPIC INSERTION GASTROSTOMY TUBE N/A 11/27/2017   Procedure: JEJUNOSTOMY TUBE PLACEMENT;  Surgeon: [name], MD;  Location: [facility];  Service: General;  Laterality: N/A;   LAPAROSCOPIC PARTIAL GASTRECTOMY N/A 11/27/2017   Procedure: PARTIAL GASTRECTOMY;  Surgeon: [name], MD;  Location: [facility];  Service: General;  Laterality: N/A;   LAPAROSCOPY N/A 11/27/2017   Procedure: LAPAROSCOPY DIAGNOSTIC;  Surgeon: [name], MD;  Location: [facility];  Service: General;  Laterality: N/A;   NM MYOVIEW LTD  06/2017   LOW RISK. Small basal inferoseptal defect thought to be related to artifact (although cannot exclude prior infarct). Normal wall motion i in this area would suggest artifact not infarct. Otherwise no ischemia or infarction.   PERIPHERAL VASCULAR INTERVENTION Left 05/03/2017   Procedure: PERIPHERAL VASCULAR INTERVENTION;  Surgeon: Conrad , MD;  Location: [facility];  Service: Cardiovascular;  Laterality: Left;  common iliac    POLYPECTOMY  11/28/2018   Procedure: POLYPECTOMY;  Surgeon: [name], MD;  Location: [facility];  Service: Endoscopy;;   TRANSTHORACIC ECHOCARDIOGRAM  06/26/2017   Normal LV size and function. EF 60-65%. No wall motion abnormalities. GR 1 DD. Mild LA dilation    Family History  Family history unknown: Yes    Social History   Socioeconomic History   Marital status: Married    Spouse name: Not on file   Number of children: 4   Years of education: Not on file   Highest education level: Not on file  Occupational History   Not on file  Tobacco Use   Smoking status: Every Day    Packs/day: 1.50    Years: 50.00    Pack years: 75.00    Types: Cigarettes    Last attempt to quit: 11/2017    Years since quitting: 3.3   Smokeless tobacco: Never   Tobacco comments:    10-12 cigarettes/24 hours;using patch  Vaping Use   Vaping Use: Never used  Substance and Sexual Activity   Alcohol use: No    Alcohol/week: 0.0 standard drinks   Drug use: No   Sexual activity: Yes  Other Topics Concern   Not on file  Social History Narrative   Not on file   Social Determinants of Health   Financial  Resource Strain: Not on file  Food Insecurity: Not on file  Transportation Needs: Not on file  Physical Activity: Not on file  Stress: Not on file  Social Connections: Not on file  Intimate Partner Violence: Not on file    Review of Systems  Constitutional:  Positive for appetite change. Negative for fatigue.  HENT: Negative.    Eyes: Negative.   Respiratory:  Positive for shortness of breath. Negative for cough, choking and chest tightness.   Cardiovascular: Negative.   Gastrointestinal: Negative.   Endocrine: Negative.   Psychiatric/Behavioral:  Negative for sleep disturbance.        Depression   Vitals:   03/07/21 1634  BP: (!) 88/50  Pulse: 97  Temp: 98.3 F (36.8 C)  SpO2: 98%     Physical Exam Constitutional:      Appearance: Normal appearance.     Comments:  Underweight  HENT:     Head: Normocephalic and atraumatic.     Nose: No congestion.     Mouth/Throat:     Mouth: Mucous membranes are moist.     Pharynx: No oropharyngeal exudate or posterior oropharyngeal erythema.  Eyes:     General: No scleral icterus.    Extraocular Movements: Extraocular movements intact.     Conjunctiva/sclera: Conjunctivae normal.     Pupils: Pupils are equal, round, and reactive to light.  Neck:     Thyroid: No thyromegaly.     Trachea: No tracheal deviation.  Cardiovascular:     Rate and Rhythm: Normal rate and regular rhythm.     Heart sounds: No murmur heard.   No friction rub.  Pulmonary:     Effort: No respiratory distress.     Breath sounds: No stridor. No wheezing.     Comments: Decreased breath sounds bilaterally Musculoskeletal:     Cervical back: No rigidity or tenderness.  Neurological:     Mental Status: He is alert.  Psychiatric:        Mood and Affect: Mood normal.   Data Reviewed:  Recent records reviewed  Assessment:  .  Advanced chronic obstructive pulmonary disease .  Chronic respiratory failure -Continue Breztri -Continue nebulization treatment with DuoNeb  Smoking cessation counseling  Oxygen supplementation -Requires 3 L of oxygen  History of anorexia  History of gastric cancer -Continues to follow-up with oncology  Ambulated today, desaturated to 87%, placed on 3 L of oxygen to maintain saturations over 90% with ambulation  Plan/Recommendations:   .  Continue inhalers  .  Continue oxygen supplementation  .  Send prescription for DuoNeb  .  Smoking cessation counseling  .  Tentative follow-up in 3 months  .  Encouraged to call with any significant concerns  .  DME referral for portable concentrator-3 L with activity  Sherrilyn Rist MD [facility] 03/07/2021, 5:14 PM  CC: [name], MD

## 2021-03-07 NOTE — Patient Instructions (Signed)
DME referral for portable oxygen concentrator  Refill DuoNeb  I will see you in 3 months

## 2021-03-08 ENCOUNTER — Encounter: Payer: Self-pay | Admitting: Nurse Practitioner

## 2021-03-08 ENCOUNTER — Telehealth: Payer: Self-pay | Admitting: Hematology

## 2021-03-08 ENCOUNTER — Encounter: Payer: Self-pay | Admitting: Hematology

## 2021-03-08 NOTE — Telephone Encounter (Signed)
Scheduled appointment per 08/30 sch msg. Patient is aware.

## 2021-03-09 ENCOUNTER — Inpatient Hospital Stay: Payer: Medicare Other | Admitting: Dietician

## 2021-03-09 NOTE — Progress Notes (Signed)
Nutrition   Patient did not show for scheduled nutrition appointment this morning. Patient has been rescheduled for September 15 with Pamala Hurry during infusion.

## 2021-03-17 ENCOUNTER — Inpatient Hospital Stay: Payer: Medicare Other | Attending: Hematology

## 2021-03-17 DIAGNOSIS — R410 Disorientation, unspecified: Secondary | ICD-10-CM | POA: Insufficient documentation

## 2021-03-17 DIAGNOSIS — Z85028 Personal history of other malignant neoplasm of stomach: Secondary | ICD-10-CM | POA: Insufficient documentation

## 2021-03-17 DIAGNOSIS — R5383 Other fatigue: Secondary | ICD-10-CM | POA: Insufficient documentation

## 2021-03-17 DIAGNOSIS — R634 Abnormal weight loss: Secondary | ICD-10-CM | POA: Insufficient documentation

## 2021-03-17 DIAGNOSIS — Z9981 Dependence on supplemental oxygen: Secondary | ICD-10-CM | POA: Insufficient documentation

## 2021-03-17 DIAGNOSIS — D519 Vitamin B12 deficiency anemia, unspecified: Secondary | ICD-10-CM | POA: Insufficient documentation

## 2021-03-17 DIAGNOSIS — Z9221 Personal history of antineoplastic chemotherapy: Secondary | ICD-10-CM | POA: Insufficient documentation

## 2021-03-17 DIAGNOSIS — E43 Unspecified severe protein-calorie malnutrition: Secondary | ICD-10-CM | POA: Insufficient documentation

## 2021-03-17 DIAGNOSIS — J449 Chronic obstructive pulmonary disease, unspecified: Secondary | ICD-10-CM | POA: Insufficient documentation

## 2021-03-21 ENCOUNTER — Ambulatory Visit (HOSPITAL_COMMUNITY): Admission: RE | Admit: 2021-03-21 | Payer: Medicare Other | Source: Ambulatory Visit

## 2021-03-24 ENCOUNTER — Inpatient Hospital Stay: Payer: Medicare Other

## 2021-03-24 ENCOUNTER — Other Ambulatory Visit: Payer: Self-pay

## 2021-03-24 ENCOUNTER — Encounter: Payer: Self-pay | Admitting: Hematology

## 2021-03-24 ENCOUNTER — Inpatient Hospital Stay: Payer: Medicare Other | Admitting: Nutrition

## 2021-03-24 ENCOUNTER — Other Ambulatory Visit: Payer: Self-pay | Admitting: Hematology

## 2021-03-24 ENCOUNTER — Inpatient Hospital Stay (HOSPITAL_BASED_OUTPATIENT_CLINIC_OR_DEPARTMENT_OTHER): Payer: Medicare Other | Admitting: Hematology

## 2021-03-24 VITALS — BP 124/83 | HR 98 | Temp 98.4°F | Resp 18 | Ht 65.0 in | Wt 83.0 lb

## 2021-03-24 DIAGNOSIS — Z95828 Presence of other vascular implants and grafts: Secondary | ICD-10-CM

## 2021-03-24 DIAGNOSIS — C169 Malignant neoplasm of stomach, unspecified: Secondary | ICD-10-CM

## 2021-03-24 DIAGNOSIS — C162 Malignant neoplasm of body of stomach: Secondary | ICD-10-CM

## 2021-03-24 DIAGNOSIS — E43 Unspecified severe protein-calorie malnutrition: Secondary | ICD-10-CM | POA: Diagnosis not present

## 2021-03-24 DIAGNOSIS — R410 Disorientation, unspecified: Secondary | ICD-10-CM | POA: Diagnosis not present

## 2021-03-24 DIAGNOSIS — E538 Deficiency of other specified B group vitamins: Secondary | ICD-10-CM

## 2021-03-24 DIAGNOSIS — R634 Abnormal weight loss: Secondary | ICD-10-CM | POA: Diagnosis not present

## 2021-03-24 DIAGNOSIS — Z9221 Personal history of antineoplastic chemotherapy: Secondary | ICD-10-CM | POA: Diagnosis not present

## 2021-03-24 DIAGNOSIS — D508 Other iron deficiency anemias: Secondary | ICD-10-CM

## 2021-03-24 DIAGNOSIS — D519 Vitamin B12 deficiency anemia, unspecified: Secondary | ICD-10-CM | POA: Diagnosis present

## 2021-03-24 DIAGNOSIS — J449 Chronic obstructive pulmonary disease, unspecified: Secondary | ICD-10-CM | POA: Diagnosis not present

## 2021-03-24 DIAGNOSIS — Z85028 Personal history of other malignant neoplasm of stomach: Secondary | ICD-10-CM | POA: Diagnosis not present

## 2021-03-24 DIAGNOSIS — Z9981 Dependence on supplemental oxygen: Secondary | ICD-10-CM | POA: Diagnosis not present

## 2021-03-24 DIAGNOSIS — R5383 Other fatigue: Secondary | ICD-10-CM | POA: Diagnosis not present

## 2021-03-24 LAB — VITAMIN B12: Vitamin B-12: 305 pg/mL (ref 180–914)

## 2021-03-24 LAB — AMMONIA: Ammonia: 37 umol/L — ABNORMAL HIGH (ref 9–35)

## 2021-03-24 MED ORDER — SODIUM CHLORIDE 0.9 % IV SOLN
400.0000 mg | Freq: Once | INTRAVENOUS | Status: AC
  Administered 2021-03-24: 400 mg via INTRAVENOUS
  Filled 2021-03-24: qty 20

## 2021-03-24 MED ORDER — SODIUM CHLORIDE 0.9% FLUSH
10.0000 mL | Freq: Once | INTRAVENOUS | Status: AC | PRN
  Administered 2021-03-24: 10 mL

## 2021-03-24 MED ORDER — CYANOCOBALAMIN 1000 MCG/ML IJ SOLN
1000.0000 ug | Freq: Once | INTRAMUSCULAR | Status: AC
  Administered 2021-03-24: 1000 ug via INTRAMUSCULAR
  Filled 2021-03-24: qty 1

## 2021-03-24 MED ORDER — HEPARIN SOD (PORK) LOCK FLUSH 100 UNIT/ML IV SOLN
500.0000 [IU] | Freq: Once | INTRAVENOUS | Status: AC | PRN
  Administered 2021-03-24: 500 [IU]

## 2021-03-24 MED ORDER — SODIUM CHLORIDE 0.9 % IV SOLN: Freq: Once | INTRAVENOUS | Status: AC

## 2021-03-24 NOTE — Progress Notes (Signed)
[place]   [phone] Fax:([phone]) [phone]   Clinic Follow up Note   Patient Care Team: [name], MD as PCP - General (Hematology) [name], MD as Consulting Physician (Pulmonary Disease) [name], MD as Attending Physician (Gastroenterology) [name], MD as Consulting Physician (General Surgery) Laurin Coder, MD as Consulting Physician (Pulmonary Disease) 03/24/2021  CHIEF COMPLAINT: confusion and fatigue   ASSESSMENT & PLAN: 65 yo male   Confusion, likely metabolic encephalopathy Worsening generalized fatigue Anorexia and weight loss, severe malnutrition COPD, on home oxygen continuously History of gastric adenocarcinoma, stage I, status post neoadjuvant chemo and surgery in early 2019   Plan -pt has been very fatigued, with intermittent confusion and disorientation, excessive sleep, for the past few weeks.  He has been increasing his oxygen flow at home, currently on 3 to 4 L/min.  His oxygen saturation was 100% when he arrived, I suspect he has hypercapnic respiratory failure, and recommend him to go to emergency room for further evaluation.  Patient declined. -We will obtain lab CBC, CMP, ammonia level, iron and B12 today -No clinical concern for COPD exacerbation, I encouraged him to follow-up with his pulmonologist -Also follow-up with PCP -History last CT chest scan from May 2020 was negative for cancer recurrence, I have low suspicion of cancer recurrence at this point -I encourage him to try megace which was prescribed last month  -will proceed with iv iron and B12 injection today  -f.u in a month with next B12 injection  -dietician f/u, Raford Pitcher will see him today    SUMMARY OF ONCOLOGIC HISTORY: Oncology History Overview Note  Cancer Staging Gastric cancer [facility]) Staging form: Stomach, AJCC 8th Edition - Clinical stage from 05/24/2017: Stage I (cT2, cN0, cM0) - Signed by [name], MD on 06/19/2017 - Pathologic  stage from 11/27/2017: Stage I (ypT1b, pN0, cM0) - Signed by [name], MD on 04/04/2018     Gastric adenocarcinoma ([place])  05/24/2017 Procedure   EGD by Dr. [name] 05/24/17 IMPRESSION Ulcerated mass like region in the distal stomach (4cm across), extensively biopsied. This appears neoplastic. - Abnormal mucosa throughout stomach otherwise (mild-moderate gastritis), also extensively biopsied.   05/24/2017 Initial Biopsy   Diagnosis 05/24/17  1. Stomach, biopsy, mass distal - ADENOCARCINOMA, SEE COMMENT. - CHRONIC ACTIVE GASTRITIS WITH INTESTINAL METAPLASIA. 2. 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 - NO DYSPLASIA OR MALIGNANCY   06/04/2017 Initial Diagnosis   Gastric cancer ([place])   06/07/2017 Procedure   EUS by Dr. [name] on 06/07/17  IMPRESSION:  5cm across, partially circumferential uT2N0 gastric adenocarcinoma laying in the distal stomach 3-4cm proximal to the pylorus.   06/22/2017 Imaging   CT CAP W Contrast 06/22/17  IMPRESSION: 1. The gastric mass is somewhat inconspicuous on CT, but a potential 1.2 cm thick mucosal masses seen along the posterior gastric border on image 83/2. There several small adjacent right gastric lymph nodes measuring up to 0.7 cm in short axis, but no overtly pathologic adenopathy and no findings of hepatic or peritoneal metastatic disease at this time. 2. Calcified granulomas in both lungs along with several noncalcified nodules stable from August 2016 and likely benign given this long-term stability. 3. Other imaging findings of potential clinical significance: Aortic Atherosclerosis (ICD10-I70.0) and Emphysema (ICD10-J43.9). Airway thickening is present, suggesting bronchitis or reactive airways disease. 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 Disc bulges at  L3-4 and L4-5.    07/05/2017 Surgery   PAC placement    07/07/2017 - 09/06/2017 Chemotherapy   Neoadjuvant FOLFOX with with leucovorin every 2 weeks. Plan for 6 cycles but stopped after 4 cycles due to poor tolerance     09/10/2017 Imaging    CT AP W Contrast 09/10/17 IMPRESSION: 1. No acute findings identified within the abdomen or pelvis. 2. Interval development of multiple bilateral pulmonary densities within both lower lobes. These are indeterminate, likely post infectious or inflammatory in etiology. Atypical infection not excluded. Metastatic disease is considered less favored but not entirely excluded and follow-up imaging to ensure resolution is advised. 3. No evidence to suggest recurrent tumor or metastatic disease within the abdomen or pelvis. 4.  Aortic Atherosclerosis (ICD10-I70.0).   11/27/2017 Surgery    Surgery He had partial gastrectomy with Dr. [name] on 11/27/2017.   11/27/2017 Pathology Results   11/27/2017 Surgical Pathology Diagnosis 1. Stomach, resection for tumor, Distal w/ Omentum - ADENOCARCINOMA, MODERATELY DIFFERENTIATED, 3.8 CM - LYMPHOVASCULAR SPACE AND PERINEURAL INVASION - NO CARCINOMA IDENTIFIED IN FOUR LYMPH NODES (0/4) - SEE ONCOLOGY TABLE AND COMMENT BELOW 2. Lymph node, biopsy, Portal - NO CARCINOMA IDENTIFIED IN ONE LYMPH NODE (0/1) - SEE COMMENT 3. Lymph node, biopsy, Left Gastric - NO CARCINOMA IDENTIFIED IN TWO LYMPH NODES (0/2) - SEE COMMENT 4. Omentum, resection for tumor - BENIGN FIBROADIPOSE TISSUE - NO CARCINOMA IDENTIFIED    11/27/2017 Cancer Staging   Staging form: Stomach, AJCC 8th Edition - Pathologic stage from 11/27/2017: Stage I (ypT1b, pN0, cM0) - Signed by [name], MD on 04/04/2018   08/14/2018 Imaging   CT CAP 08/14/18 IMPRESSION:  1. No findings of active malignancy. 2. Generally stable pulmonary nodules are identified in the lungs. The only new nodule seen today is a 3 mm right apical nodule on image 32/4 which may merit surveillance.  Many of the basilar nodules shown on the prior CT abdomen from 09/10/2017 have resolved. 3. Other imaging findings of potential clinical significance: Aortic Atherosclerosis (ICD10-I70.0). Emphysema (ICD10-J43.9). Lumbar degenerative disc disease.   08/22/2018 Procedure   Upper endoscopy 08/22/18 by Dr Verdie Drown The esophagus was slightly tortuous but there were no strictures/stenosis and the mucosa was normal throughout. The gastro-jejunostomy site was widely patent, Bilroth II anatomy. There was a 1.5-2cm region of the anastomosis that was discretely abnormal, soft but somewhat neoplastic appearing and nodular. I biopsied this region extensively. Diagnosis Stomach, biopsy, GJ anastomosis erythema - LOW GRADE GLANDULAR DYSPLASIA. - GOBLET CELL METAPLASIA. - SEE COMMENT.   11/28/2018 Procedure   Upper Endoscopy 11/28/18 by Dr Verdie Drown - Bilroth II anatomy. The previously noted adenomatous polyp was again located at the Reader anastomosis. It was soft, sessile, about 1.9cm across in greatest dimension. I used mixed cold/cautery snare resection to (apparently) completely remove the polyp. Diagnosis Stomach, polyp(s) - LOW GRADE GLANDULAR DYSPLASIA. - GOBLET CELL METAPLASIA.   07/31/2019 Procedure   Upper Endoscopy by Dr. [name] 07/31/19  IMPRESSION - Typical Bilroth II anatomy. The GJ anastomosis was slightly inflammed, but not overtly neoplastic appearing. No clear recurrent or residual polyps. The anastomosis was sampled at the sight of most significant appearing inflammation (1-2cm section, see images). - Afferent and efferent limbs were intubated briefly and appeared normal. - Mild candida esophagitis, not sampled. Will start empiric diflucan course ('100mg'$  daily for 10 days) - The examination was otherwise normal. FINAL MICROSCOPIC DIAGNOSIS:   A.  IRREGULAR MUCOSA AT GJ JUNCTION, BIOPSY:  -Anastomosis  site with peptic change and low grade dysplasia.     04/01/2020 Imaging   CT CAP IMPRESSION: 1. Redemonstrated postoperative findings of distal gastrectomy and gastric jejunostomy. No evidence of malignant recurrence. 2. There is incidental note of intussusception of the jejunum into the gastric lumen, likely transient and peristaltic in nature. 3. Stable small pulmonary nodules scattered throughout the lungs. Attention on follow-up. 4. Additional scattered, clustered centrilobular and tree-in-bud nodules in the dependent right lower lobe, most consistent with aspiration given the presence of frothy debris in the airways. 5. Severe emphysema and diffuse bilateral bronchial wall thickening. Emphysema (ICD10-J43.9). 6. Coronary artery disease. 7. 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 Aortic Atherosclerosis (ICD10-I70.0).     CURRENT THERAPY:   INTERVAL HISTORY:   REVIEW OF SYSTEMS:   Constitutional: Denies fevers, chills or abnormal weight loss Eyes: Denies blurriness of vision Ears, nose, mouth, throat, and face: Denies mucositis or sore throat Respiratory: Denies cough, dyspnea or wheezes Cardiovascular: Denies palpitation, chest discomfort or lower extremity swelling Gastrointestinal:  Denies nausea, heartburn or change in bowel habits Skin: Denies abnormal skin rashes Lymphatics: Denies new lymphadenopathy or easy bruising Neurological:Denies numbness, tingling or new weaknesses Behavioral/Psych: Mood is stable, no new changes  All other systems were reviewed with the patient and are negative.  MEDICAL HISTORY:  Past Medical History:  Diagnosis Date   COPD (chronic obstructive pulmonary disease) ([place])    Dyspnea    PAD (peripheral artery disease) ([facility])    Pneumothorax    Stomach cancer ([place])    Supplemental oxygen dependent    3.5-4 L   Tobacco abuse    Weight loss     SURGICAL  HISTORY: Past Surgical History:  Procedure Laterality Date   ABDOMINAL AORTOGRAM W/LOWER EXTREMITY N/A 05/03/2017   Procedure: ABDOMINAL AORTOGRAM W/LOWER EXTREMITY;  Surgeon: Conrad Copan, MD;  Location: [facility];  Service: Cardiovascular;  Laterality: N/A;   BIOPSY  08/22/2018   Procedure: BIOPSY;  Surgeon: [name], MD;  Location: [facility];  Service: Endoscopy;;   BIOPSY  07/31/2019   Procedure: BIOPSY;  Surgeon: [name], MD;  Location: [facility];  Service: Endoscopy;;   ESOPHAGOGASTRODUODENOSCOPY     05/24/17   ESOPHAGOGASTRODUODENOSCOPY (EGD) WITH PROPOFOL N/A 05/24/2017   Procedure: ESOPHAGOGASTRODUODENOSCOPY (EGD) WITH PROPOFOL;  Surgeon: [name], MD;  Location: [facility];  Service: Endoscopy;  Laterality: N/A;   ESOPHAGOGASTRODUODENOSCOPY (EGD) WITH PROPOFOL N/A 08/22/2018   Procedure: ESOPHAGOGASTRODUODENOSCOPY (EGD) WITH PROPOFOL;  Surgeon: [name], MD;  Location: [facility];  Service: Endoscopy;  Laterality: N/A;   ESOPHAGOGASTRODUODENOSCOPY (EGD) WITH PROPOFOL N/A 11/28/2018   Procedure: ESOPHAGOGASTRODUODENOSCOPY (EGD) WITH PROPOFOL;  Surgeon: [name], MD;  [facility];  Service: Endoscopy;  Laterality: N/A;   ESOPHAGOGASTRODUODENOSCOPY (EGD) WITH PROPOFOL N/A 07/31/2019   Procedure: ESOPHAGOGASTRODUODENOSCOPY (EGD) WITH PROPOFOL;  Surgeon: [name], MD;  Location: [facility];  Service: Endoscopy;  Laterality: N/A;   EUS N/A 06/07/2017   Procedure: UPPER ENDOSCOPIC ULTRASOUND (EUS) RADIAL;  Surgeon: [name], MD;  Location: [facility];  Service: Endoscopy;  Laterality: N/A;   FEMORAL-FEMORAL BYPASS GRAFT Bilateral 05/08/2017   Procedure: LEFT TO RIGHT BYPASS GRAFT FEMORAL-FEMORAL ARTERY;  Surgeon: Conrad Chance, MD;  Location: [place];  Service: Vascular;  Laterality: Bilateral;   IR FLUORO GUIDE PORT INSERTION RIGHT  07/05/2017   IR US GUIDE VASC  ACCESS RIGHT  07/05/2017   LAPAROSCOPIC  INSERTION GASTROSTOMY TUBE N/A 11/27/2017   Procedure: JEJUNOSTOMY TUBE PLACEMENT;  Surgeon: [name], MD;  Location: [place];  Service: General;  Laterality: N/A;   LAPAROSCOPIC PARTIAL GASTRECTOMY N/A 11/27/2017   Procedure: PARTIAL GASTRECTOMY;  Surgeon: [name], MD;  Location: [place];  Service: General;  Laterality: N/A;   LAPAROSCOPY N/A 11/27/2017   Procedure: LAPAROSCOPY DIAGNOSTIC;  Surgeon: [name], MD;  Location: [place];  Service: General;  Laterality: N/A;   NM MYOVIEW LTD  06/2017   LOW RISK. Small basal inferoseptal defect thought to be related to artifact (although cannot exclude prior infarct). Normal wall motion i in this area would suggest artifact not infarct. Otherwise no ischemia or infarction.   PERIPHERAL VASCULAR INTERVENTION Left 05/03/2017   Procedure: PERIPHERAL VASCULAR INTERVENTION;  Surgeon: Conrad Pine Bush, MD;  Location: [place] [facility];  Service: Cardiovascular;  Laterality: Left;  common iliac   POLYPECTOMY  11/28/2018   Procedure: POLYPECTOMY;  Surgeon: [name], MD;  Location: [facility];  Service: Endoscopy;;   TRANSTHORACIC ECHOCARDIOGRAM  06/26/2017   Normal LV size and function. EF 60-65%. No wall motion abnormalities. GR 1 DD. Mild LA dilation    I have reviewed the social history and family history with the patient and they are unchanged from previous note.  ALLERGIES:  has No Known Allergies.  MEDICATIONS:  Current Outpatient Medications  Medication Sig Dispense Refill   Budeson-Glycopyrrol-Formoterol (BREZTRI AEROSPHERE) 160-9-4.8 MCG/ACT AERO Inhale 2 puffs into the lungs 2 (two) times daily. 5.9 g 0   feeding supplement, ENSURE ENLIVE, (ENSURE ENLIVE) LIQD Take 237 mLs by mouth 2 (two) times daily between meals. 14222 mL 0   ipratropium-albuterol (DUONEB) 0.5-2.5 (3) MG/3ML SOLN Take 3 mLs by nebulization every 4 (four) hours as needed. Dx J44.9 360 mL 5   megestrol (MEGACE ES) 625 MG/5ML suspension Take 5 mLs (625 mg total)  by mouth daily. 150 mL 0   midodrine (PROAMATINE) 5 MG tablet Take 1 tablet (5 mg total) by mouth 3 (three) times daily with meals. 90 tablet 0   Multiple Vitamin (MULTIVITAMIN WITH MINERALS) TABS tablet Take 1 tablet by mouth daily. 30 tablet 0   ondansetron (ZOFRAN) 4 MG tablet Take 1 tablet (4 mg total) by mouth every 8 (eight) hours as needed for nausea or vomiting. 15 tablet 0   OXYGEN Inhale 4 L into the lungs continuous.     No current facility-administered medications for this visit.    PHYSICAL EXAMINATION: ECOG PERFORMANCE STATUS: 3 - Symptomatic, >50% confined to bed Blood pressure 99/52, pulse 92, temperature 99.5, pulse ox 100% on 3.5L [employer]  GENERAL:alert, no distress and comfortable, cachectic SKIN: skin color, texture, turgor are normal, no rashes or significant lesions EYES: normal, Conjunctiva are pink and non-injected, sclera clear NECK: supple, thyroid normal size, non-tender, without nodularity LYMPH:  no palpable lymphadenopathy in the cervical, axillary or inguinal LUNGS: Decreased bilateral breathing sound on auscultation, no wheezing or rales HEART: regular rate & rhythm and no murmurs and no lower extremity edema ABDOMEN:abdomen soft, non-tender and normal bowel sounds Musculoskeletal:no cyanosis of digits and no clubbing  NEURO: alert & oriented x 3 with fluent speech, no focal motor/sensory deficits  LABORATORY DATA:  I have reviewed the data as listed CBC Latest Ref Rng & Units 03/03/2021 12/03/2020 11/10/2020  WBC 4.0 - 10.5 K/uL 5.8 5.0 10.4  Hemoglobin 13.0 - 17.0 g/dL 12.4(L) 13.1 10.8(L)  Hematocrit 39.0 - 52.0 %  38.6(L) 41.3 35.0(L)  Platelets 150 - 400 K/uL 131(L) 148(L) 123(L)     CMP Latest Ref Rng & Units 03/03/2021 12/03/2020 11/10/2020  Glucose 70 - 99 mg/dL 97 87 72  BUN 8 - 23 mg/dL '10 9 10  '$ Creatinine 0.61 - 1.24 mg/dL 0.52(L) 0.52(L) 0.40(L)  Sodium 135 - 145 mmol/L 140 143 141  Potassium 3.5 - 5.1 mmol/L 4.0 4.3 4.2  Chloride 98 - 111 mmol/L  95(L) 98 107  CO2 22 - 32 mmol/L 40(H) 38(H) 30  Calcium 8.9 - 10.3 mg/dL 8.8(L) 8.9 7.4(L)  Total Protein 6.5 - 8.1 g/dL 6.1(L) 6.5 -  Total Bilirubin 0.3 - 1.2 mg/dL 0.5 0.4 -  Alkaline Phos 38 - 126 U/L 64 81 -  AST 15 - 41 U/L 13(L) 13(L) -  ALT 0 - 44 U/L 13 11 -      RADIOGRAPHIC STUDIES: I have personally reviewed the radiological images as listed and agreed with the findings in the report. No results found.     No orders of the defined types were placed in this encounter.  All questions were answered. The patient knows to call the clinic with any problems, questions or concerns. No barriers to learning was detected. I spent 20 minutes counseling the patient face to face. The total time spent in the appointment was 30 minutes and more than 50% was on counseling and review of test results     [name], MD 03/24/21

## 2021-03-24 NOTE — Progress Notes (Signed)
66 year old male diagnosed with gastric cancer under surveillance and followed by Dr. Burr Medico.  Past medical history includes partial gastrectomy in 2018, COPD on supplemental oxygen 3 to 4 L, PAD, anemia, and tobacco usage.  Medications include Megace ES, multivitamin, Zofran,  Labs are pending.  Height: 5 feet 5 inches. Weight: 83 pounds September 15. Usual body weight: 97 pounds May 3. BMI: 13.81.  Patient has had chronic poor oral intake.  He is receiving IV iron.  His family member reports he consumes some clear liquids such as grape juice, hot tea and coffee.  Patient's wife prepares foods patient has normally consumed but he eats very little.  He really does not care for oral nutrition supplements.  Family member very frustrated that patient will not try to eat.  Patient has not cooperated with family suggestions for oral intake. Nutrition focused physical exam deferred.  Nutrition diagnosis: Unintended weight loss related to cancer and associated treatments as evidenced by 14% weight loss over 4 months which is significant.  Patient likely has severe malnutrition.  Intervention: Recommended smaller more frequent meals and snacks utilizing foods patient will consume. Reviewed ways to add calories and protein. Provided samples of clear nutrition supplements for patient to try and purchasing information.  Also provided coupons. Nutrition facts sheets provided.  Support and encouragement provided.  Monitoring, evaluation, goals: Patient will tolerate increased calories and protein to minimize further weight loss.  Next visit: To be scheduled as needed.  **Disclaimer: This note was dictated with voice recognition software. Similar sounding words can inadvertently be transcribed and this note may contain transcription errors which may not have been corrected upon publication of note.**

## 2021-03-24 NOTE — Patient Instructions (Signed)

## 2021-03-25 ENCOUNTER — Other Ambulatory Visit: Payer: Self-pay

## 2021-03-25 DIAGNOSIS — C169 Malignant neoplasm of stomach, unspecified: Secondary | ICD-10-CM

## 2021-03-25 LAB — COMPREHENSIVE METABOLIC PANEL
ALT: 17 U/L (ref 0–44)
AST: 19 U/L (ref 15–41)
Albumin: 3.8 g/dL (ref 3.5–5.0)
Alkaline Phosphatase: 51 U/L (ref 38–126)
Anion gap: 10 (ref 5–15)
BUN: 18 mg/dL (ref 8–23)
CO2: 41 mmol/L — ABNORMAL HIGH (ref 22–32)
Calcium: 8.7 mg/dL — ABNORMAL LOW (ref 8.9–10.3)
Chloride: 87 mmol/L — ABNORMAL LOW (ref 98–111)
Creatinine, Ser: 0.44 mg/dL — ABNORMAL LOW (ref 0.61–1.24)
GFR, Estimated: >60 mL/min (ref >60)
Glucose, Bld: 165 mg/dL — ABNORMAL HIGH (ref 70–99)
Potassium: 4.1 mmol/L (ref 3.5–5.1)
Sodium: 138 mmol/L (ref 135–145)
Total Bilirubin: 0.5 mg/dL (ref 0.3–1.2)
Total Protein: 6 g/dL — ABNORMAL LOW (ref 6.5–8.1)

## 2021-03-25 LAB — FERRITIN: Ferritin: 90 ng/mL (ref 24–336)

## 2021-03-31 ENCOUNTER — Telehealth: Payer: Self-pay | Admitting: Hematology

## 2021-03-31 ENCOUNTER — Ambulatory Visit: Payer: Medicare Other

## 2021-03-31 ENCOUNTER — Other Ambulatory Visit: Payer: Medicare Other

## 2021-03-31 NOTE — Telephone Encounter (Signed)
Informed patient's daughter of upcoming appointment. She is aware.

## 2021-04-08 ENCOUNTER — Emergency Department (HOSPITAL_COMMUNITY): Payer: Medicare Other

## 2021-04-08 ENCOUNTER — Other Ambulatory Visit: Payer: Self-pay

## 2021-04-08 ENCOUNTER — Encounter (HOSPITAL_COMMUNITY): Payer: Self-pay

## 2021-04-08 ENCOUNTER — Emergency Department (HOSPITAL_COMMUNITY)
Admission: EM | Admit: 2021-04-08 | Discharge: 2021-04-08 | Disposition: A | Discharge status: Home / Self Care | Payer: Medicare Other | Attending: Emergency Medicine | Admitting: Emergency Medicine

## 2021-04-08 DIAGNOSIS — Z8616 Personal history of COVID-19: Secondary | ICD-10-CM | POA: Insufficient documentation

## 2021-04-08 DIAGNOSIS — F17210 Nicotine dependence, cigarettes, uncomplicated: Secondary | ICD-10-CM | POA: Insufficient documentation

## 2021-04-08 DIAGNOSIS — Z812 Family history of tobacco abuse and dependence: Secondary | ICD-10-CM | POA: Diagnosis not present

## 2021-04-08 DIAGNOSIS — Z720 Tobacco use: Secondary | ICD-10-CM

## 2021-04-08 DIAGNOSIS — R0602 Shortness of breath: Secondary | ICD-10-CM | POA: Diagnosis present

## 2021-04-08 DIAGNOSIS — J441 Chronic obstructive pulmonary disease with (acute) exacerbation: Secondary | ICD-10-CM | POA: Diagnosis not present

## 2021-04-08 DIAGNOSIS — J449 Chronic obstructive pulmonary disease, unspecified: Secondary | ICD-10-CM | POA: Insufficient documentation

## 2021-04-08 DIAGNOSIS — Z20822 Contact with and (suspected) exposure to covid-19: Secondary | ICD-10-CM | POA: Diagnosis not present

## 2021-04-08 LAB — CBC WITH DIFFERENTIAL/PLATELET
Abs Immature Granulocytes: 0.04 10*3/uL (ref 0.00–0.07)
Basophils Absolute: 0.1 10*3/uL (ref 0.0–0.1)
Basophils Relative: 1 %
Eosinophils Absolute: 0 10*3/uL (ref 0.0–0.5)
Eosinophils Relative: 0 %
HCT: 39.5 % (ref 39.0–52.0)
Hemoglobin: 12.5 g/dL — ABNORMAL LOW (ref 13.0–17.0)
Immature Granulocytes: 1 %
Lymphocytes Relative: 16 %
Lymphs Abs: 1.3 10*3/uL (ref 0.7–4.0)
MCH: 31.1 pg (ref 26.0–34.0)
MCHC: 31.6 g/dL (ref 30.0–36.0)
MCV: 98.3 fL (ref 80.0–100.0)
Monocytes Absolute: 0.4 10*3/uL (ref 0.1–1.0)
Monocytes Relative: 5 %
Neutro Abs: 6.7 10*3/uL (ref 1.7–7.7)
Neutrophils Relative %: 77 %
Platelets: 156 10*3/uL (ref 150–400)
RBC: 4.02 MIL/uL — ABNORMAL LOW (ref 4.22–5.81)
RDW: 13.4 % (ref 11.5–15.5)
WBC: 8.5 10*3/uL (ref 4.0–10.5)
nRBC: 0 % (ref 0.0–0.2)

## 2021-04-08 LAB — RESP PANEL BY RT-PCR (FLU A&B, COVID) ARPGX2
Influenza A by PCR: NEGATIVE
Influenza B by PCR: NEGATIVE
SARS Coronavirus 2 by RT PCR: NEGATIVE

## 2021-04-08 LAB — BASIC METABOLIC PANEL
Anion gap: 10 (ref 5–15)
BUN: 12 mg/dL (ref 8–23)
CO2: 41 mmol/L — ABNORMAL HIGH (ref 22–32)
Calcium: 8.7 mg/dL — ABNORMAL LOW (ref 8.9–10.3)
Chloride: 86 mmol/L — ABNORMAL LOW (ref 98–111)
Creatinine, Ser: 0.31 mg/dL — ABNORMAL LOW (ref 0.61–1.24)
GFR, Estimated: >60 mL/min (ref >60)
Glucose, Bld: 133 mg/dL — ABNORMAL HIGH (ref 70–99)
Potassium: 4.2 mmol/L (ref 3.5–5.1)
Sodium: 137 mmol/L (ref 135–145)

## 2021-04-08 MED ORDER — ALBUTEROL SULFATE (2.5 MG/3ML) 0.083% IN NEBU
5.0000 mg | INHALATION_SOLUTION | Freq: Once | RESPIRATORY_TRACT | Status: AC
  Administered 2021-04-08: 5 mg via RESPIRATORY_TRACT
  Filled 2021-04-08: qty 6

## 2021-04-08 MED ORDER — IPRATROPIUM BROMIDE 0.02 % IN SOLN
0.5000 mg | Freq: Once | RESPIRATORY_TRACT | Status: AC
  Administered 2021-04-08: 0.5 mg via RESPIRATORY_TRACT
  Filled 2021-04-08: qty 2.5

## 2021-04-08 MED ORDER — PREDNISONE 20 MG PO TABS: 20.0000 mg | ORAL_TABLET | Freq: Two times a day (BID) | ORAL | 0 refills | Status: DC

## 2021-04-08 MED ORDER — METHYLPREDNISOLONE SODIUM SUCC 125 MG IJ SOLR
125.0000 mg | Freq: Once | INTRAMUSCULAR | Status: AC
  Administered 2021-04-08: 125 mg via INTRAVENOUS
  Filled 2021-04-08: qty 2

## 2021-04-08 NOTE — Discharge Instructions (Addendum)
Use your inhalers as directed.  Start the prednisone prescription tomorrow.  It was sent to your pharmacy.  Try to cut back on smoking.

## 2021-04-08 NOTE — ED Provider Notes (Signed)
[facility] DEPT Provider Note   CSN: [id number] Arrival date & time: 04/08/21  1515     History Chief Complaint  Patient presents with   Shortness of Breath    Bobby Caldwell is a 65 y.o. male.  HPI He presents for evaluation of shortness of breath.  He states he has not used any medicine yet today.  Patient presented from home at the request of family members, to be evaluated for trouble breathing.  He has medication, chronically.  He recently saw his oncologist who felt like his intestinal cancer was controlled.  There is been no recent fever, productive cough, nausea or vomiting.  There are no other known active modifying factors.    Past Medical History:  Diagnosis Date   COPD (chronic obstructive pulmonary disease) ([place])    Dyspnea    PAD (peripheral artery disease) ([place])    Pneumothorax    Stomach cancer ([place])    Supplemental oxygen dependent    3.5-4 L   Tobacco abuse    Weight loss     Patient Active Problem List   Diagnosis Date Noted   Syncope 11/09/2020   Weakness 11/18/2019   Medication management 10/22/2019   Healthcare maintenance 10/22/2019   Acute on chronic respiratory failure with hypoxia and hypercapnia (HCC) 10/12/2019   Hyponatremia 10/12/2019   History of COVID-19 10/12/2019   Acute respiratory failure with hypoxia ([place]) 09/11/2019   Protein-calorie malnutrition, severe 09/11/2019   Acute respiratory failure with hypoxia and hypercapnia ([place]) 09/10/2019   COPD with acute exacerbation ([place]) 09/10/2019   Gastric mass    Dysphagia    Chronic hypoxemic respiratory failure ([place]) 03/13/2018   S/P gastric surgery 01/22/2018   Nausea 12/20/2017   S/P vascular surgery 11/15/2017   Pneumonia 09/10/2017   Acute on chronic respiratory failure with hypoxia ([place]) 09/10/2017   Exercise hypoxemia 09/04/2017   Moderate protein-calorie malnutrition ([place]) 07/24/2017   Port-A-Cath in place 07/06/2017   DOE (dyspnea on exertion)  06/12/2017   Atypical chest pain 06/12/2017   Essential hypertension 06/12/2017   Preop cardiovascular exam 06/12/2017   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   Gastritis and gastroduodenitis    Atherosclerosis of native arteries of extremity with intermittent claudication ([place]) 05/03/2017   Critical lower limb ischemia ([place]) 05/03/2017   Abnormal CT of the chest 08/02/2015   Underweight 03/30/2015   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   Weight loss 02/16/2015   Cigarette smoker 02/16/2015    Past Surgical History:  Procedure Laterality Date   ABDOMINAL AORTOGRAM W/LOWER EXTREMITY N/A 05/03/2017   Procedure: ABDOMINAL AORTOGRAM W/LOWER EXTREMITY;  Surgeon: Conrad Rockford, MD;  Location: [facility];  Service: Cardiovascular;  Laterality: N/A;   BIOPSY  08/22/2018   Procedure: BIOPSY;  Surgeon: [name], MD;  Location: [facility];  Service: Endoscopy;;   BIOPSY  07/31/2019   Procedure: BIOPSY;  Surgeon: [name], MD;  [facility];  Service: Endoscopy;;   ESOPHAGOGASTRODUODENOSCOPY     05/24/17   ESOPHAGOGASTRODUODENOSCOPY (EGD) WITH PROPOFOL N/A 05/24/2017   Procedure: ESOPHAGOGASTRODUODENOSCOPY (EGD) WITH PROPOFOL;  Surgeon: [name], MD;  [facility];  Service: Endoscopy;  Laterality: N/A;   ESOPHAGOGASTRODUODENOSCOPY (EGD) WITH PROPOFOL N/A 08/22/2018   Procedure: ESOPHAGOGASTRODUODENOSCOPY (EGD) WITH PROPOFOL;  Surgeon: [name], MD;  [facility];  Service: Endoscopy;  Laterality: N/A;   ESOPHAGOGASTRODUODENOSCOPY (EGD) WITH PROPOFOL N/A 11/28/2018   Procedure: ESOPHAGOGASTRODUODENOSCOPY (EGD) WITH PROPOFOL;  Surgeon:  [name], MD;  Location: [facility];  Service: Endoscopy;  Laterality: N/A;   ESOPHAGOGASTRODUODENOSCOPY (EGD) WITH PROPOFOL N/A 07/31/2019   Procedure: ESOPHAGOGASTRODUODENOSCOPY (EGD) WITH PROPOFOL;  Surgeon: [name], MD;  [facility];  Service: Endoscopy;  Laterality: N/A;   EUS N/A 06/07/2017   Procedure: UPPER ENDOSCOPIC ULTRASOUND (EUS) RADIAL;  Surgeon: [name], MD;  [facility];  Service: Endoscopy;  Laterality: N/A;   FEMORAL-FEMORAL BYPASS GRAFT Bilateral 05/08/2017   Procedure: LEFT TO RIGHT BYPASS GRAFT FEMORAL-FEMORAL ARTERY;  Surgeon: Conrad Weweantic, MD;  Location: [facility];  Service: Vascular;  Laterality: Bilateral;   IR FLUORO GUIDE PORT INSERTION RIGHT  07/05/2017   IR US GUIDE VASC ACCESS RIGHT  07/05/2017   LAPAROSCOPIC INSERTION GASTROSTOMY TUBE N/A 11/27/2017   Procedure: JEJUNOSTOMY TUBE PLACEMENT;  Surgeon: [name], MD;  Location: [facility];  Service: General;  Laterality: N/A;   LAPAROSCOPIC PARTIAL GASTRECTOMY N/A 11/27/2017   Procedure: PARTIAL GASTRECTOMY;  Surgeon: [name], MD;  Location: [facility];  Service: General;  Laterality: N/A;   LAPAROSCOPY N/A 11/27/2017   Procedure: LAPAROSCOPY DIAGNOSTIC;  Surgeon: [name], MD;  Location: [facility];  Service: General;  Laterality: N/A;   NM MYOVIEW LTD  06/2017   LOW RISK. Small basal inferoseptal defect thought to be related to artifact (although cannot exclude prior infarct). Normal wall motion i in this area would suggest artifact not infarct. Otherwise no ischemia or infarction.   PERIPHERAL VASCULAR INTERVENTION Left 05/03/2017   Procedure: PERIPHERAL VASCULAR INTERVENTION;  Surgeon: Conrad Brandt, MD;  Location: [facility];  Service: Cardiovascular;  Laterality: Left;  common iliac   POLYPECTOMY  11/28/2018   Procedure: POLYPECTOMY;  Surgeon: [name], MD;  [facility];  Service: Endoscopy;;   TRANSTHORACIC ECHOCARDIOGRAM  06/26/2017   Normal LV size and function. EF 60-65%. No wall motion abnormalities. GR 1 DD. Mild LA dilation       Family History  Family history unknown: Yes    Social History   Tobacco Use   Smoking status: Every Day    Packs/day: 1.50    Years: 50.00    Pack  years: 75.00    Types: Cigarettes    Last attempt to quit: 11/2017    Years since quitting: 3.4   Smokeless tobacco: Never   Tobacco comments:    10-12 cigarettes/24 hours;using patch  Vaping Use   Vaping Use: Never used  Substance Use Topics   Alcohol use: No    Alcohol/week: 0.0 standard drinks   Drug use: No    Home Medications Prior to Admission medications   Medication Sig Start Date End Date Taking? Authorizing Provider  predniSONE (DELTASONE) 20 MG tablet Take 1 tablet (20 mg total) by mouth 2 (two) times daily. 04/08/21  Yes Daleen Bo, MD  Budeson-Glycopyrrol-Formoterol (BREZTRI AEROSPHERE) 160-9-4.8 MCG/ACT AERO Inhale 2 puffs into the lungs 2 (two) times daily. 08/12/20   Laurin Coder, MD  feeding supplement, ENSURE ENLIVE, (ENSURE ENLIVE) LIQD Take 237 mLs by mouth 2 (two) times daily between meals. 09/13/19   Mariel Aloe, MD  ipratropium-albuterol (DUONEB) 0.5-2.5 (3) MG/3ML SOLN Take 3 mLs by nebulization every 4 (four) hours as needed. Dx J44.9 03/07/21   Laurin Coder, MD  megestrol (MEGACE ES) 625 MG/5ML suspension Take 5 mLs (625 mg total) by mouth daily. 03/03/21   Alla Feeling, NP  midodrine (PROAMATINE) 5 MG tablet Take 1 tablet (5 mg total)  by mouth 3 (three) times daily with meals. 11/10/20   Shelly Coss, MD  Multiple Vitamin (MULTIVITAMIN WITH MINERALS) TABS tablet Take 1 tablet by mouth daily. 09/13/19   Mariel Aloe, MD  ondansetron (ZOFRAN) 4 MG tablet Take 1 tablet (4 mg total) by mouth every 8 (eight) hours as needed for nausea or vomiting. 03/23/20   [name], MD  OXYGEN Inhale 4 L into the lungs continuous.    [provider]    Allergies    Patient has no known allergies.  Review of Systems   Review of Systems  All other systems reviewed and are negative.  Physical Exam Updated Vital Signs BP 103/71   Pulse 82   Temp 98 F (36.7 C) (Oral)   Resp (!) 21   Ht 5\' 5"  (1.651 m)   Wt 37.6 kg   SpO2 92%   BMI 13.79  kg/m   Physical Exam Vitals and nursing note reviewed.  Constitutional:      Appearance: He is well-developed. He is not ill-appearing.     Comments: Nearly cachectic appearance.  HENT:     Head: Normocephalic and atraumatic.     Right Ear: External ear normal.     Left Ear: External ear normal.  Eyes:     Conjunctiva/sclera: Conjunctivae normal.     Pupils: Pupils are equal, round, and reactive to light.  Neck:     Trachea: Phonation normal.  Cardiovascular:     Rate and Rhythm: Normal rate and regular rhythm.     Heart sounds: Normal heart sounds.  Pulmonary:     Effort: Pulmonary effort is normal. No respiratory distress.     Breath sounds: No stridor.     Comments: Decreased air movement bilaterally.  No significant wheezing noted.  No increased work of breathing. Abdominal:     Palpations: Abdomen is soft.     Tenderness: There is no abdominal tenderness.  Musculoskeletal:        General: Normal range of motion.     Cervical back: Normal range of motion and neck supple.  Skin:    General: Skin is warm and dry.  Neurological:     Mental Status: He is alert and oriented to person, place, and time.     Cranial Nerves: No cranial nerve deficit.     Sensory: No sensory deficit.     Motor: No abnormal muscle tone.     Coordination: Coordination normal.  Psychiatric:        Mood and Affect: Mood normal.        Behavior: Behavior normal.        Thought Content: Thought content normal.        Judgment: Judgment normal.    ED Results / Procedures / Treatments   Labs (all labs ordered are listed, but only abnormal results are displayed) Labs Reviewed  BASIC METABOLIC PANEL - Abnormal; Notable for the following components:      Result Value   Chloride 86 (*)    CO2 41 (*)    Glucose, Bld 133 (*)    Creatinine, Ser 0.31 (*)    Calcium 8.7 (*)    All other components within normal limits  CBC WITH DIFFERENTIAL/PLATELET - Abnormal; Notable for the following components:    RBC 4.02 (*)    Hemoglobin 12.5 (*)    All other components within normal limits  RESP PANEL BY RT-PCR (FLU A&B, COVID) ARPGX2    EKG None  Radiology DG Chest  Port 1 View  Result Date: 04/08/2021 CLINICAL DATA:  Shortness of breath. EXAM: PORTABLE CHEST 1 VIEW COMPARISON:  Chest x-ray 11/09/2020. FINDINGS: The lungs are hyperinflated, unchanged. Right chest port catheter tip projects over the distal SVC, unchanged. There is a stable nodular density in the left upper lobe, likely calcified. Bilateral nipple shadows present. No focal lung infiltrate, pleural effusion or pneumothorax. Cardiomediastinal silhouette is within normal limits. No acute fractures are seen. IMPRESSION: No active disease. Electronically Signed   By: Ronney Asters M.D.   On: 04/08/2021 17:13    Procedures Procedures   Medications Ordered in ED Medications  methylPREDNISolone sodium succinate (SOLU-MEDROL) 125 mg/2 mL injection 125 mg (125 mg Intravenous Given 04/08/21 1915)  albuterol (PROVENTIL) (2.5 MG/3ML) 0.083% nebulizer solution 5 mg (5 mg Nebulization Given 04/08/21 1929)  ipratropium (ATROVENT) nebulizer solution 0.5 mg (0.5 mg Nebulization Given 04/08/21 1929)    ED Course  I have reviewed the triage vital signs and the nursing notes.  Pertinent labs & imaging results that were available during my care of the patient were reviewed by me and considered in my medical decision making (see chart for details).    MDM Rules/Calculators/A&P                            Patient Vitals for the past 24 hrs:  BP Temp Temp src Pulse Resp SpO2 Height Weight  04/08/21 2115 103/71 -- -- 82 (!) 21 92 % -- --  04/08/21 2100 101/75 -- -- 89 16 92 % -- --  04/08/21 2045 117/72 -- -- 87 11 97 % -- --  04/08/21 2030 133/88 -- -- 82 16 95 % -- --  04/08/21 2000 117/73 98 F (36.7 C) Oral 75 20 95 % -- --  04/08/21 1945 108/69 -- -- 81 14 95 % -- --  04/08/21 1930 119/79 -- -- 79 (!) 25 100 % -- --  04/08/21 1917  111/77 -- -- 86 18 100 % -- --  04/08/21 1915 111/77 -- -- 74 17 100 % -- --  04/08/21 1800 128/77 -- -- 80 (!) 22 98 % -- --  04/08/21 1730 (!) 155/82 -- -- 100 (!) 22 100 % -- --  04/08/21 1645 (!) 136/120 -- -- 86 14 100 % -- --  04/08/21 1540 -- -- -- -- -- -- 5\' 5"  (1.651 m) 37.6 kg  04/08/21 1539 -- -- -- -- -- 100 % -- --  04/08/21 1532 125/79 98.2 F (36.8 C) Oral 83 18 100 % -- --  04/08/21 1525 -- -- -- -- -- 100 % -- --    11:11 AM Reevaluation with update and discussion. After initial assessment and treatment, an updated evaluation reveals vital signs normal/stable.  Findings discussed with patient's daughter and all questions were answered. Daleen Bo   Medical Decision Making:  This patient is presenting for evaluation of shortness of breath, which does require a range of treatment options, and is a complaint that involves a moderate risk of morbidity and mortality. The differential diagnoses include COPD exacerbation, pneumonia, acute infection. I decided to review old records, and in summary elderly male with severe COPD, malnutrition and recently recovered from GI cancer.  He is on chronic oxygen therapy at home I obtained additional historical information from patient's daughter, Glennis Brink, by telephone.  Clinical Laboratory Tests Ordered, included CBC, Metabolic panel, and viral panel . Review indicates normal except chloride low, CO2 high, glucose  high, creatinine low, calcium low, hemoglobin low. Radiologic Tests Ordered, included chest x-ray.  I independently Visualized: Radiographic images, which show no acute abnormalities  Cardiac Monitor Tracing which shows sinus rhythm     Critical Interventions-clinical evaluation, medication treatment, radiography, observation and reassessment.  After These Interventions, the Patient was reevaluated and was found stable for discharge.  Patient with COPD exacerbation.  Repeated medication dosing, not necessary.  Oxygenation  at baseline on usual nasal cannula oxygen.  He is extremely malnourished and chronically ill with ongoing tobacco abuse no overt acute compromise, requiring hospitalization.  CRITICAL CARE-no Performed by: Daleen Bo  Nursing Notes Reviewed/ Care Coordinated Applicable Imaging Reviewed Interpretation of Laboratory Data incorporated into ED treatment  The patient appears reasonably screened and/or stabilized for discharge and I doubt any other medical condition or other [facility] requiring further screening, evaluation, or treatment in the ED at this time prior to discharge.  Plan: Home Medications-continue usual; Home Treatments-rest, fluids, stop smoking; return here if the recommended treatment, does not improve the symptoms; Recommended follow up-PCP, as needed     Final Clinical Impression(s) / ED Diagnoses Final diagnoses:  COPD exacerbation ([place])  Tobacco abuse    Rx / DC Orders ED Discharge Orders          Ordered    predniSONE (DELTASONE) 20 MG tablet  2 times daily        04/08/21 1956             Daleen Bo, MD 04/09/21 1115

## 2021-04-08 NOTE — ED Triage Notes (Signed)
Patient BIB [facility] from home with c/o SOB and weakness the last couple of days.EMS reports pt has history of COPD and wears 4L/[place] at all times.Expiratory wheezing heard in right lung

## 2021-04-26 ENCOUNTER — Telehealth: Payer: Self-pay | Admitting: Hematology

## 2021-04-26 NOTE — Telephone Encounter (Signed)
   Bobby Caldwell DOB: [birth year] MRN: [id number]   RIDER WAIVER AND RELEASE OF LIABILITY  For purposes of improving physical access to our facilities, [facility] is pleased to partner with third parties to provide [place] patients or other authorized individuals the option of convenient, on-demand ground transportation services (the [place]") through use of the technology service that enables users to request on-demand ground transportation from independent third-party providers.  By opting to use and accept these [employer], I, the undersigned, hereby agree on behalf of myself, and on behalf of any minor child using the Government social research officer for whom I am the parent or legal guardian, as follows:  Government social research officer provided to me are provided by independent third-party transportation providers who are not [employer] or employees and who are unaffiliated with [employer]. [facility] is neither a transportation carrier nor a common or public carrier. [facility] has no control over the quality or safety of the transportation that occurs as a result of the [employer]. [facility] cannot guarantee that any third-party transportation provider will complete any arranged transportation service. [facility] makes no representation, warranty, or guarantee regarding the reliability, timeliness, quality, safety, suitability, or availability of any of the Transport Services or that they will be error free. I fully understand that traveling by vehicle involves risks and dangers of serious bodily injury, including permanent disability, paralysis, and death. I agree, on behalf of myself and on behalf of any minor child using the Transport Services for whom I am the parent or legal guardian, that the entire risk arising out of my use of the [employer] remains solely with me, to the maximum extent permitted under applicable law. The [employer] are provided "as is"  and "as available." [facility] disclaims all representations and warranties, express, implied or statutory, not expressly set out in these terms, including the implied warranties of merchantability and fitness for a particular purpose. I hereby waive and release [facility], its agents, employees, officers, directors, representatives, insurers, attorneys, assigns, successors, subsidiaries, and affiliates from any and all past, present, or future claims, demands, liabilities, actions, causes of action, or suits of any kind directly or indirectly arising from acceptance and use of the [employer]. I further waive and release [facility] and its affiliates from all present and future liability and responsibility for any injury or death to persons or damages to property caused by or related to the use of the [employer]. I have read this Waiver and Release of Liability, and I understand the terms used in it and their legal significance. This Waiver is freely and voluntarily given with the understanding that my right (as well as the right of any minor child for whom I am the parent or legal guardian using the [employer]) to legal recourse against  in connection with the [employer] is knowingly surrendered in return for use of these services.   I attest that I read the consent document to Bobby Caldwell, gave Bobby Caldwell the opportunity to ask questions and answered the questions asked (if any). I affirm that Bobby Caldwell then provided consent for he's participation in this program.     Bobby Caldwell, patient daughter gave consent on his behalf

## 2021-04-27 ENCOUNTER — Inpatient Hospital Stay (HOSPITAL_COMMUNITY): Payer: Medicare Other

## 2021-04-27 ENCOUNTER — Other Ambulatory Visit: Payer: Self-pay

## 2021-04-27 ENCOUNTER — Emergency Department (HOSPITAL_COMMUNITY): Payer: Medicare Other

## 2021-04-27 ENCOUNTER — Inpatient Hospital Stay (HOSPITAL_COMMUNITY)
Admission: EM | Admit: 2021-04-27 | Discharge: 2021-05-02 | DRG: 189 | Disposition: A | Discharge status: Hospice | Payer: Medicare Other | Source: Ambulatory Visit | Attending: Family Medicine | Admitting: Family Medicine

## 2021-04-27 ENCOUNTER — Encounter (HOSPITAL_COMMUNITY): Payer: Self-pay

## 2021-04-27 DIAGNOSIS — Z7951 Long term (current) use of inhaled steroids: Secondary | ICD-10-CM

## 2021-04-27 DIAGNOSIS — Z66 Do not resuscitate: Secondary | ICD-10-CM | POA: Diagnosis present

## 2021-04-27 DIAGNOSIS — I739 Peripheral vascular disease, unspecified: Secondary | ICD-10-CM | POA: Diagnosis present

## 2021-04-27 DIAGNOSIS — Z9221 Personal history of antineoplastic chemotherapy: Secondary | ICD-10-CM | POA: Diagnosis not present

## 2021-04-27 DIAGNOSIS — R0602 Shortness of breath: Secondary | ICD-10-CM

## 2021-04-27 DIAGNOSIS — E876 Hypokalemia: Secondary | ICD-10-CM | POA: Diagnosis not present

## 2021-04-27 DIAGNOSIS — G9341 Metabolic encephalopathy: Secondary | ICD-10-CM | POA: Diagnosis present

## 2021-04-27 DIAGNOSIS — D638 Anemia in other chronic diseases classified elsewhere: Secondary | ICD-10-CM | POA: Diagnosis present

## 2021-04-27 DIAGNOSIS — F17210 Nicotine dependence, cigarettes, uncomplicated: Secondary | ICD-10-CM | POA: Diagnosis present

## 2021-04-27 DIAGNOSIS — F419 Anxiety disorder, unspecified: Secondary | ICD-10-CM | POA: Diagnosis present

## 2021-04-27 DIAGNOSIS — K5641 Fecal impaction: Secondary | ICD-10-CM | POA: Diagnosis present

## 2021-04-27 DIAGNOSIS — D72819 Decreased white blood cell count, unspecified: Secondary | ICD-10-CM | POA: Diagnosis present

## 2021-04-27 DIAGNOSIS — Z8616 Personal history of COVID-19: Secondary | ICD-10-CM | POA: Diagnosis not present

## 2021-04-27 DIAGNOSIS — J9621 Acute and chronic respiratory failure with hypoxia: Secondary | ICD-10-CM | POA: Diagnosis present

## 2021-04-27 DIAGNOSIS — C169 Malignant neoplasm of stomach, unspecified: Secondary | ICD-10-CM

## 2021-04-27 DIAGNOSIS — I959 Hypotension, unspecified: Secondary | ICD-10-CM | POA: Diagnosis present

## 2021-04-27 DIAGNOSIS — I10 Essential (primary) hypertension: Secondary | ICD-10-CM | POA: Diagnosis present

## 2021-04-27 DIAGNOSIS — J432 Centrilobular emphysema: Secondary | ICD-10-CM | POA: Diagnosis present

## 2021-04-27 DIAGNOSIS — E8729 Other acidosis: Secondary | ICD-10-CM | POA: Diagnosis present

## 2021-04-27 DIAGNOSIS — Z681 Body mass index (BMI) 19 or less, adult: Secondary | ICD-10-CM

## 2021-04-27 DIAGNOSIS — Z9981 Dependence on supplemental oxygen: Secondary | ICD-10-CM

## 2021-04-27 DIAGNOSIS — Z85028 Personal history of other malignant neoplasm of stomach: Secondary | ICD-10-CM

## 2021-04-27 DIAGNOSIS — E43 Unspecified severe protein-calorie malnutrition: Secondary | ICD-10-CM | POA: Diagnosis present

## 2021-04-27 DIAGNOSIS — G893 Neoplasm related pain (acute) (chronic): Secondary | ICD-10-CM | POA: Diagnosis not present

## 2021-04-27 DIAGNOSIS — R64 Cachexia: Secondary | ICD-10-CM | POA: Diagnosis present

## 2021-04-27 DIAGNOSIS — J9622 Acute and chronic respiratory failure with hypercapnia: Secondary | ICD-10-CM | POA: Diagnosis not present

## 2021-04-27 DIAGNOSIS — Z903 Acquired absence of stomach [part of]: Secondary | ICD-10-CM | POA: Diagnosis not present

## 2021-04-27 DIAGNOSIS — Z7189 Other specified counseling: Secondary | ICD-10-CM | POA: Diagnosis not present

## 2021-04-27 DIAGNOSIS — Z79899 Other long term (current) drug therapy: Secondary | ICD-10-CM

## 2021-04-27 DIAGNOSIS — E222 Syndrome of inappropriate secretion of antidiuretic hormone: Secondary | ICD-10-CM | POA: Diagnosis present

## 2021-04-27 DIAGNOSIS — R531 Weakness: Secondary | ICD-10-CM | POA: Diagnosis not present

## 2021-04-27 DIAGNOSIS — Z515 Encounter for palliative care: Secondary | ICD-10-CM | POA: Diagnosis not present

## 2021-04-27 DIAGNOSIS — R1084 Generalized abdominal pain: Secondary | ICD-10-CM | POA: Diagnosis not present

## 2021-04-27 DIAGNOSIS — J449 Chronic obstructive pulmonary disease, unspecified: Secondary | ICD-10-CM | POA: Diagnosis not present

## 2021-04-27 DIAGNOSIS — J441 Chronic obstructive pulmonary disease with (acute) exacerbation: Secondary | ICD-10-CM | POA: Diagnosis not present

## 2021-04-27 DIAGNOSIS — E878 Other disorders of electrolyte and fluid balance, not elsewhere classified: Secondary | ICD-10-CM | POA: Diagnosis not present

## 2021-04-27 LAB — LACTIC ACID, PLASMA: Lactic Acid, Venous: 1.1 mmol/L (ref 0.5–1.9)

## 2021-04-27 LAB — COMPREHENSIVE METABOLIC PANEL
ALT: 15 U/L (ref 0–44)
AST: 14 U/L — ABNORMAL LOW (ref 15–41)
Albumin: 3 g/dL — ABNORMAL LOW (ref 3.5–5.0)
Alkaline Phosphatase: 58 U/L (ref 38–126)
BUN: 8 mg/dL (ref 8–23)
CO2: >45 mmol/L — ABNORMAL HIGH (ref 22–32)
Calcium: 8.2 mg/dL — ABNORMAL LOW (ref 8.9–10.3)
Chloride: 85 mmol/L — ABNORMAL LOW (ref 98–111)
Creatinine, Ser: 0.4 mg/dL — ABNORMAL LOW (ref 0.61–1.24)
GFR, Estimated: >60 mL/min (ref >60)
Glucose, Bld: 90 mg/dL (ref 70–99)
Potassium: 4.1 mmol/L (ref 3.5–5.1)
Sodium: 138 mmol/L (ref 135–145)
Total Bilirubin: 0.5 mg/dL (ref 0.3–1.2)
Total Protein: 5 g/dL — ABNORMAL LOW (ref 6.5–8.1)

## 2021-04-27 LAB — TROPONIN I (HIGH SENSITIVITY)
Troponin I (High Sensitivity): 6 ng/L (ref <18)
Troponin I (High Sensitivity): 6 ng/L (ref <18)

## 2021-04-27 LAB — I-STAT VENOUS BLOOD GAS, ED
Acid-Base Excess: 23 mmol/L — ABNORMAL HIGH (ref 0.0–2.0)
Acid-Base Excess: 23 mmol/L — ABNORMAL HIGH (ref 0.0–2.0)
Bicarbonate: 54.7 mmol/L — ABNORMAL HIGH (ref 20.0–28.0)
Bicarbonate: 52.3 mmol/L — ABNORMAL HIGH (ref 20.0–28.0)
Calcium, Ion: 1.12 mmol/L — ABNORMAL LOW (ref 1.15–1.40)
Calcium, Ion: 1.06 mmol/L — ABNORMAL LOW (ref 1.15–1.40)
HCT: 35 % — ABNORMAL LOW (ref 39.0–52.0)
HCT: 36 % — ABNORMAL LOW (ref 39.0–52.0)
Hemoglobin: 11.9 g/dL — ABNORMAL LOW (ref 13.0–17.0)
Hemoglobin: 12.2 g/dL — ABNORMAL LOW (ref 13.0–17.0)
O2 Saturation: 73 %
O2 Saturation: 100 %
Potassium: 4 mmol/L (ref 3.5–5.1)
Potassium: 4.2 mmol/L (ref 3.5–5.1)
Sodium: 137 mmol/L (ref 135–145)
Sodium: 135 mmol/L (ref 135–145)
TCO2: >50 mmol/L — ABNORMAL HIGH (ref 22–32)
TCO2: >50 mmol/L — ABNORMAL HIGH (ref 22–32)
pCO2, Ven: 106.2 mmHg (ref 44.0–60.0)
pCO2, Ven: 83.9 mmHg (ref 44.0–60.0)
pH, Ven: 7.32 (ref 7.250–7.430)
pH, Ven: 7.403 (ref 7.250–7.430)
pO2, Ven: 46 mmHg — ABNORMAL HIGH (ref 32.0–45.0)
pO2, Ven: 187 mmHg — ABNORMAL HIGH (ref 32.0–45.0)

## 2021-04-27 LAB — CBC WITH DIFFERENTIAL/PLATELET
Abs Immature Granulocytes: 0.01 10*3/uL (ref 0.00–0.07)
Basophils Absolute: 0.1 10*3/uL (ref 0.0–0.1)
Basophils Relative: 1 %
Eosinophils Absolute: 0.1 10*3/uL (ref 0.0–0.5)
Eosinophils Relative: 2 %
HCT: 35.5 % — ABNORMAL LOW (ref 39.0–52.0)
Hemoglobin: 10.8 g/dL — ABNORMAL LOW (ref 13.0–17.0)
Immature Granulocytes: 0 %
Lymphocytes Relative: 15 %
Lymphs Abs: 0.8 10*3/uL (ref 0.7–4.0)
MCH: 30.9 pg (ref 26.0–34.0)
MCHC: 30.4 g/dL (ref 30.0–36.0)
MCV: 101.7 fL — ABNORMAL HIGH (ref 80.0–100.0)
Monocytes Absolute: 0.4 10*3/uL (ref 0.1–1.0)
Monocytes Relative: 8 %
Neutro Abs: 3.8 10*3/uL (ref 1.7–7.7)
Neutrophils Relative %: 74 %
Platelets: 146 10*3/uL — ABNORMAL LOW (ref 150–400)
RBC: 3.49 MIL/uL — ABNORMAL LOW (ref 4.22–5.81)
RDW: 13.2 % (ref 11.5–15.5)
WBC: 5.2 10*3/uL (ref 4.0–10.5)
nRBC: 0 % (ref 0.0–0.2)

## 2021-04-27 LAB — AMMONIA: Ammonia: 22 umol/L (ref 9–35)

## 2021-04-27 LAB — RESP PANEL BY RT-PCR (FLU A&B, COVID) ARPGX2
Influenza A by PCR: NEGATIVE
Influenza B by PCR: NEGATIVE
SARS Coronavirus 2 by RT PCR: NEGATIVE

## 2021-04-27 LAB — SALICYLATE LEVEL: Salicylate Lvl: <7 mg/dL — ABNORMAL LOW (ref 7.0–30.0)

## 2021-04-27 LAB — MAGNESIUM: Magnesium: 1.9 mg/dL (ref 1.7–2.4)

## 2021-04-27 LAB — ACETAMINOPHEN LEVEL: Acetaminophen (Tylenol), Serum: <10 ug/mL — ABNORMAL LOW (ref 10–30)

## 2021-04-27 LAB — LIPASE, BLOOD: Lipase: 24 U/L (ref 11–51)

## 2021-04-27 LAB — ETHANOL: Alcohol, Ethyl (B): <10 mg/dL (ref <10)

## 2021-04-27 MED ORDER — ENOXAPARIN SODIUM 40 MG/0.4ML IJ SOSY: 40.0000 mg | PREFILLED_SYRINGE | INTRAMUSCULAR | Status: DC

## 2021-04-27 MED ORDER — IOHEXOL 300 MG/ML  SOLN
80.0000 mL | Freq: Once | INTRAMUSCULAR | Status: AC | PRN
  Administered 2021-04-27: 80 mL via INTRAVENOUS

## 2021-04-27 MED ORDER — LACTATED RINGERS IV SOLN: INTRAVENOUS | Status: DC

## 2021-04-27 MED ORDER — HEPARIN SODIUM (PORCINE) 5000 UNIT/ML IJ SOLN
5000.0000 [IU] | Freq: Three times a day (TID) | INTRAMUSCULAR | Status: DC
  Administered 2021-04-28 – 2021-04-30 (×8): 5000 [IU] via SUBCUTANEOUS
  Filled 2021-04-27 (×8): qty 1

## 2021-04-27 MED ORDER — LACTATED RINGERS IV BOLUS
1000.0000 mL | Freq: Once | INTRAVENOUS | Status: AC
  Administered 2021-04-27: 1000 mL via INTRAVENOUS

## 2021-04-27 NOTE — Progress Notes (Addendum)
FPTS Brief Progress Note   Pt declines interpreter.    S: Spoke with family who report for last week and a half but worse in the past 4 days. Daughter, Mati, states he has been talking about people who are not present. Today when she came in he started asking for his wife. Patient is not himself right now. Family is planning to come in the morning 09-9:30 AM.   Mati expressed concern about the families ability to care for patient at home. States they have not been able to handle him lately. He has "only been wearing 2 L when he wants to". Family feels he is not listening to their requests and are concerned about his health.  Home O2 usually between 3-4L.  Says lately been using albuterol every 4 hours.  O: BP 93/80   Pulse 82   Temp 98.9 F (37.2 C) (Oral)   Resp 16   SpO2 99%    GEN: Cachectic looking male HEENT: Temporal wasting  CVS: Regular rate and rhythm on monitor RESP: Wearing BiPAP   A/P: Acute hypoxic respiratory failure Transitioned patient off of BiPAP to home oxygen 3 L.  Oxygen saturations did not drop below 95%.  RT and trauma nurse at bedside.  Given wet clothe to remove dry skin from lips.  - monitor resp status closely    AMS  Per family, pt is alerted. He does know he did state that he was in [place] but frequently referenced others in the room. Obtain CT Head. Family to bring pt's medications tomorrow AM.  - Orders reviewed. Labs for AM ordered, which was adjusted as needed.  - Follow up CT Head  Social Issues  - TOC consult placed to discuss pt's living arrangements going forward   Lyndee Hensen, DO 04/27/2021, 10:08 PM PGY-3, [facility] Family Medicine Night Resident  Please page [phone] with questions.

## 2021-04-27 NOTE — ED Triage Notes (Signed)
Pt BIB [facility] from home d/t feeling lethargic & weak over the past week. Decreased intake and increased confusion was reported by pt and family. Language barrier increased accurate story from pt (per EMS). Pt speaks [place]. EMS reports82/40 & then 500 NS bolus was given in a 18g Lt FA PIV, then 115/61. NSR & 88 bpm on their 12 L. Hx of Lung CA (per EMS) & he is on 4L O2 at baseline. CBG 138.

## 2021-04-27 NOTE — H&P (Addendum)
[facility] Admission History and Physical Service Pager: [phone]  Patient name: Bobby Caldwell Medical record number: [id number] Date of birth: 12-08-1955 Age: 65 y.o. Gender: male  Primary Care Provider: [name], MD Consultants: None Code Status: Full code which was confirmed with family  Preferred Emergency Contact: Daughter: Bobby Caldwell [phone]  Chief Complaint: Lethargy, AMS  Assessment and Plan: Bobby Caldwell is a 65 y.o. male presenting with chief complaint of worsening fatigue, lethargy and poor oral intake. PMH of COPD, gastric adenocarcinoma s/p treatment and no known recurrence, and malnutrition.  Acute on Chronic Hypoxic Hypercapnic Respiratory Failure Obtained most of history from patient's daughter over the phone who tells [place] that for the past 1.5 weeks the patient has been lethargic, which increased dramatically roughly 3 days ago (10/16). Per ED provider note, family reports to EMS he has been sleeping all day long with low activity levels. He also seems confused which is different from his baseline. Daughter says he started to use two O2 tanks at the same time to try to help his breathing. Upon EMS arrival, he was hypotensive with systolic BP's in the 83M and received a 500 mL bolus en route with improvement of BP to 115/73. He arrived to the ED with AMS, and he was placed on BiPap due to VBG results of pH of 7.3, pCO2 of 106.2, pO2 of 46.0, and BiCarb of 54.7. CXR is negative for acute changes. COVID, Influenza A/B PCR negative. Labs largely unremarkable with WBC 5.2, Hgb stable at 10.8, Na+ 138, K+ 4.1, Cr 0.40. Lipase normal at 24, with no concern for acute pancreatitis. Ammonia, salicylate, and acetaminophen levels all unremarkable. This is likely a severe COPD exacerbation in addition to deconditioning. - Admit to FPTS, progressive, attending Dr. Erin Caldwell - Continue BiPap and monitor respiratory status closely - Supplemental O2, goal SpO2  88-92% - Continuous cardiac monitoring - Vital signs per floor - F/u HIV antibody test - Monitor mental status - f/u CT chest, abdomen, pelvis - Confirm mental status with family, low threshold to obtain head CT if altered from baseline  Abdominal Pain Pt has a hx of gastric adenocarcinoma s/p treatment and is reportedly in remission. Although he was altered during exam and ROS, pt did show guarding to palpation of his abdomen, specifically in the LUQ and suprapubic area. No evidence of right or left CVA tenderness. According to family, pt says he has felt nauseous. - Pending CT chest/abdomen/pelvic - Monitor with serial abdominal exams  Malnutrition Pt has a history of being malnourished in the past. He appears very cachectic and malnourished. In ED, patient has albumin of 3 and total protein of 5 suggesting malnourished status. Received 1 L of LR bolus (10/19) - mIVF 1100mL/hr LR - Consider RD consult  COPD Recently seen at [facility] ED for COPD exacerbation on 9/30 in which he received Solu-Medrol, Atrovent and albuterol nebulizer. On 3-4L home oxygen. Follows outpatient with pulmonologist Dr. Annamaria Caldwell. -started BiPAP in ED, will continue monitoring and change treatment pending clinical course -negative acute changes on CXR  History of Stage 1 Gastric Adenocarcinoma Follows outpatient with oncologist Dr. Truitt Caldwell. He is s/p neoadjuvant chemo and partial gastrectomy with Dr. Barry Caldwell in early 2019. CT chest from May 2020 negative for cancer recurrence, and Dr. Burr Caldwell had low suspicion at office visit on 03/24/2021 for gastric cancer. He was prescribed Megace and receives B12 injections monthly. - CT chest/abdomen/pelvis today  Tobacco Abuse Chronic. - No nicotine patch for now  FEN/GI: NPO Prophylaxis: Heparin  Disposition: Progressive  History of Present Illness:  [name] is a 65 y.o. male presenting with worsening fatigue, lethargy, poor oral intake, and dizziness. He was on  BiPap while interviewing the patient which made it very difficult to understand given language barrier as well. He repeatedly said "I dont know" when asking if he knew why he was hospitalized.  Interpreter used for interview. Level 5 caveat due to acuity of condition. Chart reviewed, ED provider provided history. Review Of Systems: Per HPI with the following additions:   Review of Systems  Unable to perform ROS: Acuity of condition    Patient Active Problem List   Diagnosis Date Noted   Syncope 11/09/2020   Weakness 11/18/2019   Medication management 10/22/2019   Healthcare maintenance 10/22/2019   Acute on chronic respiratory failure with hypoxia and hypercapnia (HCC) 10/12/2019   Hyponatremia 10/12/2019   History of COVID-19 10/12/2019   Acute respiratory failure with hypoxia ([place]) 09/11/2019   Protein-calorie malnutrition, severe 09/11/2019   Acute respiratory failure with hypoxia and hypercapnia ([place]) 09/10/2019   COPD with acute exacerbation ([place]) 09/10/2019   Gastric mass    Dysphagia    Chronic hypoxemic respiratory failure ([place]) 03/13/2018   S/P gastric surgery 01/22/2018   Nausea 12/20/2017   S/P vascular surgery 11/15/2017   Pneumonia 09/10/2017   Acute on chronic respiratory failure with hypoxia (HCC) 09/10/2017   Exercise hypoxemia 09/04/2017   Moderate protein-calorie malnutrition ([place]) 07/24/2017   Port-A-Cath in place 07/06/2017   DOE (dyspnea on exertion) 06/12/2017   Atypical chest pain 06/12/2017   Essential hypertension 06/12/2017   Preop cardiovascular exam 06/12/2017   Gastric adenocarcinoma ([place]) 06/04/2017   Gastritis and gastroduodenitis    Atherosclerosis of native arteries of extremity with intermittent claudication ([place]) 05/03/2017   Critical lower limb ischemia ([place]) 05/03/2017   Abnormal CT of the chest 08/02/2015   Underweight 03/30/2015   COPD (chronic obstructive pulmonary disease) with emphysema ([place]) 02/16/2015   Weight loss 02/16/2015    Cigarette smoker 02/16/2015    Past Medical History: Past Medical History:  Diagnosis Date   COPD (chronic obstructive pulmonary disease) ([place])    Dyspnea    PAD (peripheral artery disease) ([place])    Pneumothorax    Stomach cancer ([place])    Supplemental oxygen dependent    3.5-4 L   Tobacco abuse    Weight loss     Past Surgical History: Past Surgical History:  Procedure Laterality Date   ABDOMINAL AORTOGRAM W/LOWER EXTREMITY N/A 05/03/2017   Procedure: ABDOMINAL AORTOGRAM W/LOWER EXTREMITY;  Surgeon: Conrad Windsor, MD;  Location: [facility];  Service: Cardiovascular;  Laterality: N/A;   BIOPSY  08/22/2018   Procedure: BIOPSY;  Surgeon: [name], MD;  Location: [facility];  Service: Endoscopy;;   BIOPSY  07/31/2019   Procedure: BIOPSY;  Surgeon: [name], MD;  [facility]: [facility];  Service: Endoscopy;;   ESOPHAGOGASTRODUODENOSCOPY     05/24/17   ESOPHAGOGASTRODUODENOSCOPY (EGD) WITH PROPOFOL N/A 05/24/2017   Procedure: ESOPHAGOGASTRODUODENOSCOPY (EGD) WITH PROPOFOL;  Surgeon: [name], MD;  [facility];  Service: Endoscopy;  Laterality: N/A;   ESOPHAGOGASTRODUODENOSCOPY (EGD) WITH PROPOFOL N/A 08/22/2018   Procedure: ESOPHAGOGASTRODUODENOSCOPY (EGD) WITH PROPOFOL;  Surgeon: [name], MD;  [facility];  Service: Endoscopy;  Laterality: N/A;   ESOPHAGOGASTRODUODENOSCOPY (EGD) WITH PROPOFOL N/A 11/28/2018   Procedure: ESOPHAGOGASTRODUODENOSCOPY (EGD) WITH PROPOFOL;  Surgeon: [name], MD;  [facility];  Service: Endoscopy;  Laterality: N/A;   ESOPHAGOGASTRODUODENOSCOPY (EGD) WITH PROPOFOL N/A 07/31/2019   Procedure: ESOPHAGOGASTRODUODENOSCOPY (EGD) WITH PROPOFOL;  Surgeon: [name], MD;  [facility]: [facility];  Service: Endoscopy;  Laterality: N/A;   EUS N/A 06/07/2017   Procedure: UPPER ENDOSCOPIC ULTRASOUND (EUS) RADIAL;  Surgeon: [name], MD;  [facility];  Service:  Endoscopy;  Laterality: N/A;   FEMORAL-FEMORAL BYPASS GRAFT Bilateral 05/08/2017   Procedure: LEFT TO RIGHT BYPASS GRAFT FEMORAL-FEMORAL ARTERY;  Surgeon: Conrad Moore, MD;  Location: [facility];  Service: Vascular;  Laterality: Bilateral;   IR FLUORO GUIDE PORT INSERTION RIGHT  07/05/2017   IR US GUIDE VASC ACCESS RIGHT  07/05/2017   LAPAROSCOPIC INSERTION GASTROSTOMY TUBE N/A 11/27/2017   Procedure: JEJUNOSTOMY TUBE PLACEMENT;  Surgeon: [name], MD;  Location: [facility];  Service: General;  Laterality: N/A;   LAPAROSCOPIC PARTIAL GASTRECTOMY N/A 11/27/2017   Procedure: PARTIAL GASTRECTOMY;  Surgeon: [name], MD;  Location: [facility];  Service: General;  Laterality: N/A;   LAPAROSCOPY N/A 11/27/2017   Procedure: LAPAROSCOPY DIAGNOSTIC;  Surgeon: [name], MD;  Location: [facility];  Service: General;  Laterality: N/A;   NM MYOVIEW LTD  06/2017   LOW RISK. Small basal inferoseptal defect thought to be related to artifact (although cannot exclude prior infarct). Normal wall motion i in this area would suggest artifact not infarct. Otherwise no ischemia or infarction.   PERIPHERAL VASCULAR INTERVENTION Left 05/03/2017   Procedure: PERIPHERAL VASCULAR INTERVENTION;  Surgeon: Conrad [place], MD;  Location: [facility];  Service: Cardiovascular;  Laterality: Left;  common iliac   POLYPECTOMY  11/28/2018   Procedure: POLYPECTOMY;  Surgeon: [name], MD;  [facility];  Service: Endoscopy;;   TRANSTHORACIC ECHOCARDIOGRAM  06/26/2017   Normal LV size and function. EF 60-65%. No wall motion abnormalities. GR 1 DD. Mild LA dilation    Social History: Social History   Tobacco Use   Smoking status: Every Day    Packs/day: 1.50    Years: 50.00    Pack years: 75.00    Types: Cigarettes    Last attempt to quit: 11/2017    Years since quitting: 3.4   Smokeless tobacco: Never   Tobacco comments:    10-12 cigarettes/24 hours;using patch  Vaping Use   Vaping Use: Never used   Substance Use Topics   Alcohol use: No    Alcohol/week: 0.0 standard drinks   Drug use: No    Family History: Family History  Family history unknown: Yes    Allergies and Medications: No Known Allergies No current facility-administered medications on file prior to encounter.   Current Outpatient Medications on File Prior to Encounter  Medication Sig Dispense Refill   Budeson-Glycopyrrol-Formoterol (BREZTRI AEROSPHERE) 160-9-4.8 MCG/ACT AERO Inhale 2 puffs into the lungs 2 (two) times daily. 5.9 g 0   feeding supplement, ENSURE ENLIVE, (ENSURE ENLIVE) LIQD Take 237 mLs by mouth 2 (two) times daily between meals. 14222 mL 0   ipratropium-albuterol (DUONEB) 0.5-2.5 (3) MG/3ML SOLN Take 3 mLs by nebulization every 4 (four) hours as needed. Dx J44.9 360 mL 5   megestrol (MEGACE ES) 625 MG/5ML suspension Take 5 mLs (625 mg total) by mouth daily. 150 mL 0   midodrine (PROAMATINE) 5 MG tablet Take 1 tablet (5 mg total) by mouth 3 (three) times daily with meals. 90 tablet 0   Multiple Vitamin (MULTIVITAMIN WITH MINERALS) TABS tablet Take 1 tablet by mouth daily. 30 tablet 0  ondansetron (ZOFRAN) 4 MG tablet Take 1 tablet (4 mg total) by mouth every 8 (eight) hours as needed for nausea or vomiting. 15 tablet 0   OXYGEN Inhale 4 L into the lungs continuous.     predniSONE (DELTASONE) 20 MG tablet Take 1 tablet (20 mg total) by mouth 2 (two) times daily. 10 tablet 0    Objective: BP 106/85   Pulse 81   Temp 98.9 F (37.2 C) (Oral)   Resp 16   SpO2 100%  Exam: General: Cachectic, chronically-ill appearing, labored breathing via BiPap Eyes: PERRLA ENTM: dry mucous membranes, clear and patent airway Neck: no appearance of JVD on exam Cardiovascular: RRR w/o murmurs, 2+ pulses bilaterally,  Respiratory: CTAB, no rales, on BiPap Gastrointestinal: tender to palpation in all quadrants with some guarding on left side.  MSK: normal muscle tones, no LE edema Derm: warm, dry skin, appears  cachectic Neuro: AMS, patient responds to any question asked with "I don't know" Psych: confused  Labs and Imaging: CBC BMET  Recent Labs  Lab 04/27/21 1102 04/27/21 1112  WBC 5.2  --   HGB 10.8* 11.9*  HCT 35.5* 35.0*  PLT 146*  --    Recent Labs  Lab 04/27/21 1102 04/27/21 1112  NA 138 137  K 4.1 4.0  CL 85*  --   CO2 >45*  --   BUN 8  --   CREATININE 0.40*  --   GLUCOSE 90  --   CALCIUM 8.2*  --      EKG: 88bpm, Normal sinus rhythm, no acute ST or T wave abnormalities indicative of ischemia  Orvis Brill, DO [place] PGY-1, [facility] Intern pager: [phone], text pages welcome   FPTS Upper-Level Resident Addendum   I have independently interviewed and examined the patient. I have discussed the above with Dr. Owens Shark and agree with the documented plan. My edits for correction/addition/clarification are included above. Please see any attending notes.   Alcus Dad, MD PGY-2, [facility] 04/27/2021 8:29 PM  [facility] Service pager: [phone] (text pages welcome through [facility])

## 2021-04-27 NOTE — ED Notes (Signed)
Maddie daughter [phone] or Carolynn Sayers daughter [phone] call with any updates

## 2021-04-27 NOTE — ED Provider Notes (Signed)
[facility] EMERGENCY DEPARTMENT Provider Note   CSN: [id number] Arrival date & time: 04/27/21  0935     History Chief Complaint  Patient presents with   Weakness   Lethargic    Bobby Caldwell is a 65 y.o. male.  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  Family reported to EMS that patient has been sleeping all day long and not doing his normal activity which is getting up and moving around.  He is also seemed confused which is different from his baseline.  Patient does admit to abdominal pain and vomiting and reports he has been coughing a lot.  He denies any shortness of breath at this time.  Unclear if patient has been running fever.  He is not able to answer if he has had any urinary symptoms or bowel changes.  Patient is intermittently falling asleep on evaluation.  When asked if he had any hematemesis he does not give an answer  The history is provided by the patient and medical records. The history is limited by the absence of a caregiver and a language barrier. A language interpreter was used.  Weakness     Past Medical History:  Diagnosis Date   COPD (chronic obstructive pulmonary disease) (HCC)    Dyspnea    PAD (peripheral artery disease) (HCC)    Pneumothorax    Stomach cancer ([place])    Supplemental oxygen dependent    3.5-4 L   Tobacco abuse    Weight loss     Patient Active Problem List   Diagnosis Date Noted   Syncope 11/09/2020   Weakness 11/18/2019   Medication management 10/22/2019   Healthcare maintenance 10/22/2019   Acute on chronic respiratory failure with hypoxia and hypercapnia (HCC) 10/12/2019   Hyponatremia 10/12/2019   History of COVID-19 10/12/2019   Acute  respiratory failure with hypoxia ([place]) 09/11/2019   Protein-calorie malnutrition, severe 09/11/2019   Acute respiratory failure with hypoxia and hypercapnia ([place]) 09/10/2019   COPD with acute exacerbation ([place]) 09/10/2019   Gastric mass    Dysphagia    Chronic hypoxemic respiratory failure ([place]) 03/13/2018   S/P gastric surgery 01/22/2018   Nausea 12/20/2017   S/P vascular surgery 11/15/2017   Pneumonia 09/10/2017   Acute on chronic respiratory failure with hypoxia ([place]) 09/10/2017   Exercise hypoxemia 09/04/2017   Moderate protein-calorie malnutrition ([place]) 07/24/2017   Port-A-Cath in place 07/06/2017   DOE (dyspnea on exertion) 06/12/2017   Atypical chest pain 06/12/2017   Essential hypertension 06/12/2017   Preop cardiovascular exam 06/12/2017   Gastric adenocarcinoma ([place]) 06/04/2017   Gastritis and gastroduodenitis    Atherosclerosis of native arteries of extremity with intermittent claudication ([place]) 05/03/2017   Critical lower limb ischemia ([place]) 05/03/2017   Abnormal CT of the chest 08/02/2015   Underweight 03/30/2015   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   Weight loss 02/16/2015   Cigarette smoker 02/16/2015    Past Surgical History:  Procedure Laterality Date   ABDOMINAL AORTOGRAM W/LOWER EXTREMITY N/A 05/03/2017   Procedure: ABDOMINAL AORTOGRAM W/LOWER EXTREMITY;  Surgeon: Conrad Nettie, MD;  Location: [facility];  Service: Cardiovascular;  Laterality: N/A;   BIOPSY  08/22/2018   Procedure: BIOPSY;  Surgeon:  [name], MD;  Location: [facility];  Service: Endoscopy;;   BIOPSY  07/31/2019   Procedure: BIOPSY;  Surgeon: [name], MD;  Location: [facility];  Service: Endoscopy;;   ESOPHAGOGASTRODUODENOSCOPY     05/24/17   ESOPHAGOGASTRODUODENOSCOPY (EGD) WITH PROPOFOL N/A 05/24/2017   Procedure: ESOPHAGOGASTRODUODENOSCOPY (EGD) WITH PROPOFOL;  Surgeon: [name], MD;  [facility]: [facility];   Service: Endoscopy;  Laterality: N/A;   ESOPHAGOGASTRODUODENOSCOPY (EGD) WITH PROPOFOL N/A 08/22/2018   Procedure: ESOPHAGOGASTRODUODENOSCOPY (EGD) WITH PROPOFOL;  Surgeon: [name], MD;  [facility]: [facility];  Service: Endoscopy;  Laterality: N/A;   ESOPHAGOGASTRODUODENOSCOPY (EGD) WITH PROPOFOL N/A 11/28/2018   Procedure: ESOPHAGOGASTRODUODENOSCOPY (EGD) WITH PROPOFOL;  Surgeon: [name], MD;  [facility];  Service: Endoscopy;  Laterality: N/A;   ESOPHAGOGASTRODUODENOSCOPY (EGD) WITH PROPOFOL N/A 07/31/2019   Procedure: ESOPHAGOGASTRODUODENOSCOPY (EGD) WITH PROPOFOL;  Surgeon: [name], MD;  [facility];  Service: Endoscopy;  Laterality: N/A;   EUS N/A 06/07/2017   Procedure: UPPER ENDOSCOPIC ULTRASOUND (EUS) RADIAL;  Surgeon: [name], MD;  [facility];  Service: Endoscopy;  Laterality: N/A;   FEMORAL-FEMORAL BYPASS GRAFT Bilateral 05/08/2017   Procedure: LEFT TO RIGHT BYPASS GRAFT FEMORAL-FEMORAL ARTERY;  Surgeon: Conrad Cupertino, MD;  Location: [facility];  Service: Vascular;  Laterality: Bilateral;   IR FLUORO GUIDE PORT INSERTION RIGHT  07/05/2017   IR US GUIDE VASC ACCESS RIGHT  07/05/2017   LAPAROSCOPIC INSERTION GASTROSTOMY TUBE N/A 11/27/2017   Procedure: JEJUNOSTOMY TUBE PLACEMENT;  Surgeon: [name], MD;  Location: [facility];  Service: General;  Laterality: N/A;   LAPAROSCOPIC PARTIAL GASTRECTOMY N/A 11/27/2017   Procedure: PARTIAL GASTRECTOMY;  Surgeon: [name], MD;  Location: [facility];  Service: General;  Laterality: N/A;   LAPAROSCOPY N/A 11/27/2017   Procedure: LAPAROSCOPY DIAGNOSTIC;  Surgeon: [name], MD;  Location: [facility];  Service: General;  Laterality: N/A;   NM MYOVIEW LTD  06/2017   LOW RISK. Small basal inferoseptal defect thought to be related to artifact (although cannot exclude prior infarct). Normal wall motion i in this area would suggest artifact not infarct. Otherwise no ischemia or infarction.    PERIPHERAL VASCULAR INTERVENTION Left 05/03/2017   Procedure: PERIPHERAL VASCULAR INTERVENTION;  Surgeon: Conrad [place], MD;  Location: [facility];  Service: Cardiovascular;  Laterality: Left;  common iliac   POLYPECTOMY  11/28/2018   Procedure: POLYPECTOMY;  Surgeon: [name], MD;  [facility]: [facility];  Service: Endoscopy;;   TRANSTHORACIC ECHOCARDIOGRAM  06/26/2017   Normal LV size and function. EF 60-65%. No wall motion abnormalities. GR 1 DD. Mild LA dilation       Family History  Family history unknown: Yes    Social History   Tobacco Use   Smoking status: Every Day    Packs/day: 1.50    Years: 50.00    Pack years: 75.00    Types: Cigarettes    Last attempt to quit: 11/2017    Years since quitting: 3.4   Smokeless tobacco: Never   Tobacco comments:    10-12 cigarettes/24 hours;using patch  Vaping Use   Vaping Use: Never used  Substance Use Topics   Alcohol use: No    Alcohol/week: 0.0 standard drinks   Drug use: No    Home Medications Prior to Admission medications   Medication Sig Start Date End Date Taking? Authorizing Provider  Budeson-Glycopyrrol-Formoterol (BREZTRI AEROSPHERE) 160-9-4.8 MCG/ACT AERO Inhale 2 puffs into the lungs 2 (two) times  daily. 08/12/20   Laurin Coder, MD  feeding supplement, ENSURE ENLIVE, (ENSURE ENLIVE) LIQD Take 237 mLs by mouth 2 (two) times daily between meals. 09/13/19   Mariel Aloe, MD  ipratropium-albuterol (DUONEB) 0.5-2.5 (3) MG/3ML SOLN Take 3 mLs by nebulization every 4 (four) hours as needed. Dx J44.9 03/07/21   Laurin Coder, MD  megestrol (MEGACE ES) 625 MG/5ML suspension Take 5 mLs (625 mg total) by mouth daily. 03/03/21   Alla Feeling, NP  midodrine (PROAMATINE) 5 MG tablet Take 1 tablet (5 mg total) by mouth 3 (three) times daily with meals. 11/10/20   Shelly Coss, MD  Multiple Vitamin (MULTIVITAMIN WITH MINERALS) TABS tablet Take 1 tablet by mouth daily. 09/13/19   Mariel Aloe, MD   ondansetron (ZOFRAN) 4 MG tablet Take 1 tablet (4 mg total) by mouth every 8 (eight) hours as needed for nausea or vomiting. 03/23/20   [name], MD  OXYGEN Inhale 4 L into the lungs continuous.    [provider]  predniSONE (DELTASONE) 20 MG tablet Take 1 tablet (20 mg total) by mouth 2 (two) times daily. 04/08/21   Daleen Bo, MD    Allergies    Patient has no known allergies.  Review of Systems   Review of Systems  Unable to perform ROS: Mental status change  Neurological:  Positive for weakness.   Physical Exam Updated Vital Signs BP 115/73   Pulse 86   Temp 98.9 F (37.2 C) (Oral)   Resp 20   SpO2 100%   Physical Exam Vitals and nursing note reviewed.  Constitutional:      General: He is not in acute distress.    Appearance: He is well-developed. He is cachectic.  HENT:     Head: Normocephalic and atraumatic.     Mouth/Throat:     Mouth: Mucous membranes are dry.  Eyes:     Pupils: Pupils are equal, round, and reactive to light.     Comments: Pale conjunctive a  Cardiovascular:     Rate and Rhythm: Normal rate and regular rhythm.     Pulses: Normal pulses.     Heart sounds: No murmur heard. Pulmonary:     Effort: Pulmonary effort is normal. No respiratory distress.     Breath sounds: No wheezing or rales.     Comments: Mildly decreased breath sounds diffusely but no significant wheezing.  Port-A-Cath noted in the right upper chest wall Abdominal:     General: There is no distension.     Palpations: Abdomen is soft.     Tenderness: There is abdominal tenderness in the right lower quadrant, suprapubic area and left lower quadrant. There is no right CVA tenderness, left CVA tenderness, guarding or rebound.  Musculoskeletal:        General: No tenderness. Normal range of motion.     Cervical back: Normal range of motion and neck supple.     Right lower leg: No edema.     Left lower leg: No edema.  Skin:    General: Skin is warm and dry.      Findings: No erythema or rash.  Neurological:     Mental Status: He is lethargic.     Comments: Patient is very drowsy and intermittently falling asleep but will wake up to stimulation and was able to converse some with the [place] interpreter.  Moving all extremities without difficulty.  Psychiatric:     Comments: somnolent    ED Results / Procedures /  Treatments   Labs (all labs ordered are listed, but only abnormal results are displayed) Labs Reviewed  CBC WITH DIFFERENTIAL/PLATELET - Abnormal; Notable for the following components:      Result Value   RBC 3.49 (*)    Hemoglobin 10.8 (*)    HCT 35.5 (*)    MCV 101.7 (*)    Platelets 146 (*)    All other components within normal limits  COMPREHENSIVE METABOLIC PANEL - Abnormal; Notable for the following components:   Chloride 85 (*)    CO2 >45 (*)    Creatinine, Ser 0.40 (*)    Calcium 8.2 (*)    Total Protein 5.0 (*)    Albumin 3.0 (*)    AST 14 (*)    All other components within normal limits  ACETAMINOPHEN LEVEL - Abnormal; Notable for the following components:   Acetaminophen (Tylenol), Serum <10 (*)    All other components within normal limits  SALICYLATE LEVEL - Abnormal; Notable for the following components:   Salicylate Lvl <4.9 (*)    All other components within normal limits  I-STAT VENOUS BLOOD GAS, ED - Abnormal; Notable for the following components:   pCO2, Ven 106.2 (*)    pO2, Ven 46.0 (*)    Bicarbonate 54.7 (*)    TCO2 >50 (*)    Acid-Base Excess 23.0 (*)    Calcium, Ion 1.12 (*)    HCT 35.0 (*)    Hemoglobin 11.9 (*)    All other components within normal limits  RESP PANEL BY RT-PCR (FLU A&B, COVID) ARPGX2  LIPASE, BLOOD  AMMONIA  LACTIC ACID, PLASMA  MAGNESIUM  ETHANOL  URINALYSIS, ROUTINE W REFLEX MICROSCOPIC  TROPONIN I (HIGH SENSITIVITY)  TROPONIN I (HIGH SENSITIVITY)    EKG EKG Interpretation  Date/Time:  Wednesday April 27 2021 09:48:10 EDT Ventricular Rate:  88 PR  Interval:  133 QRS Duration: 76 QT Interval:  351 QTC Calculation: 425 R Axis:   89 Text Interpretation: Sinus rhythm Anteroseptal infarct, age indeterminate ST elevation, consider inferior injury No significant change since last tracing Confirmed by Blanchie Dessert [id number]) on 04/27/2021 10:07:53 AM  Radiology DG Chest Port 1 View  Result Date: 04/27/2021 CLINICAL DATA:  65 year old male with lethargy. Cough. History of lung cancer. Possible sepsis. EXAM: PORTABLE CHEST 1 VIEW COMPARISON:  Portable chest 04/08/2021 and earlier. FINDINGS: Portable AP upright view at 1006 hours. Chronic hyperinflation with large lung volumes. Stable right chest power port. Mediastinal contours are stable and within normal limits. No pneumothorax, pulmonary edema, pleural effusion or acute pulmonary opacity. Osteopenia. No acute osseous abnormality identified. Negative visible bowel gas. IMPRESSION: Chronic lung disease with hyperinflation. No acute cardiopulmonary abnormality. Electronically Signed   By: Genevie Ann M.D.   On: 04/27/2021 10:25    Procedures Procedures   Medications Ordered in ED Medications  lactated ringers bolus 1,000 mL (has no administration in time range)    ED Course  I have reviewed the triage vital signs and the nursing notes.  Pertinent labs & imaging results that were available during my care of the patient were reviewed by me and considered in my medical decision making (see chart for details).    MDM Rules/Calculators/A&P                         11  Elderly male presenting today with multiple complaints but very vague history as there is no family present with him currently.  It was reported 1  week of worsening lethargy, poor oral intake the patient also admitted to vomiting and cough.  No reported history of fever.  Patient does have a history of COPD and is on oxygen chronically.  He also has a history of gastric adenocarcinoma which appears to be in remission based on  oncology notes 1 month ago.  However patient does report nausea and vomiting.  He does have lower abdominal pain today.  Unclear if he has had any change in bowel movements or if this is more of a chronic issue for him.  He was seen at his doctor's office approximately a month ago for similar complaints and at that time they were concerned for possible CO2 narcosis and recommended he go to the emergency room which he did not at that time.  Patient is not wheezing on exam here but is lethargic.  No reported history of patient being an alcohol abuser.  However with altered mental status and concern for metabolic encephalopathy we will check a CO2 level as well as ammonia.  Electrolytes and renal function pending.  Patient will most likely need a scan of his abdomen due to the discomfort concern for possible obstruction, diverticulitis, colitis.  Patient was hypotensive when EMS arrived to 88 systolic and received 564 mL in route with improvement of blood pressure to 115/73.  Vital signs are normal at this time.  11:22 AM Pt's CO2 was elevated at 106 and due to decreased mental status and lethargy bipap was started.  VS remain stable.  3:22 PM Remainder of labs are reassuring.  On repeat evaluation patient is no longer having any abdominal pain.  He is resting comfortably on BiPAP.  Vital signs remained stable.  Chest x-ray without acute findings.  Will admit for further care.  MDM   Amount and/or Complexity of Data Reviewed Clinical lab tests: ordered and reviewed Tests in the radiology section of CPT: ordered and reviewed Tests in the medicine section of CPT: ordered and reviewed Independent visualization of images, tracings, or specimens: yes  Patient Progress Patient progress: stable  CRITICAL CARE Performed by: Karlene Southard Total critical care time: 30 minutes Critical care time was exclusive of separately billable procedures and treating other patients. Critical care was necessary to  treat or prevent imminent or life-threatening deterioration. Critical care was time spent personally by me on the following activities: development of treatment plan with patient and/or surrogate as well as nursing, discussions with consultants, evaluation of patient's response to treatment, examination of patient, obtaining history from patient or surrogate, ordering and performing treatments and interventions, ordering and review of laboratory studies, ordering and review of radiographic studies, pulse oximetry and re-evaluation of patient's condition.   Final Clinical Impression(s) / ED Diagnoses Final diagnoses:  Acute on chronic respiratory failure with hypercapnia [facility])    Rx / DC Orders ED Discharge Orders     None        Blanchie Dessert, MD 04/27/21 1526

## 2021-04-27 NOTE — ED Notes (Signed)
Patient transported to CT 

## 2021-04-27 NOTE — Hospital Course (Addendum)
Bobby Caldwell is a 65 year old male presenting with worsening fatigue and lethargy and decreased PO intake. PMH is notable for COPD (3-4L O2 at baseline), gastric adenocarcinoma s/p treatment and no known recurrence, and malnutrition. Hospital course is outlined below:  Acute on chronic hypercapnic respiratory failure Family reported increased lethargy and confusion, as well as dyspnea for the past several days. Admit VBG of 7.3/106/64/55, indicating significant hypercapnia with chronic compensation with increased bicarb. CXR was unremarkable aside from hyperinflated lungs. Per chart review, patient's FEV1/FVC was 46% several years ago. It was thought that his presentation was a COPD exacerbation. He was started on BiPAP and was weaned successfully to nasal cannula. He required BiPAP nightly and was treated with DuoNebs, albuterol, Brovana, Pulmicort, and steroids.  After patient decided to go home with hospice care, morphine prn was added onto his regimen.  Patient was discharged with DuoNebs every 4 hours, albuterol nebulizer every 2 hours as needed, Pulmicort twice daily, Brovana nebulizer twice daily, prednisone 40 mg daily for 5 days, and morphine 5mg  sublingual every 2 hours as needed.  Altered mental status Thought to be due to acute on chronic hypercarbia. However, family reports a long history of steadily declining mentation. Per recent baseline, patient is only able to feed himself and is dependent on family for other ADLs. Workup included negative CTH, UA and CRX unremarkable, ethanol <10, ammonia WNL, HIV and RPR negative.    Malnutrition Patient is clearly cachectic.  RD was consulted and did a 48-hour calorie count, encouraged Ensure 3 times daily between meals and Magic cup 3 times daily with meals.  Patient was also given thiamine and  a multivitamin daily.  Goals of care Family initially desired full code after repeated discussions with the primary team. Palliative care was consulted and  ultimately family decided to make patient DNR and to have patient go home with hospice care.  Upon discharge, Xanax 0.5 mg 3 times daily as needed was added onto his regimen for anxiety.

## 2021-04-28 ENCOUNTER — Inpatient Hospital Stay (HOSPITAL_COMMUNITY): Payer: Medicare Other

## 2021-04-28 ENCOUNTER — Inpatient Hospital Stay: Payer: Medicare Other

## 2021-04-28 ENCOUNTER — Inpatient Hospital Stay: Payer: Medicare Other | Attending: Hematology

## 2021-04-28 DIAGNOSIS — G9341 Metabolic encephalopathy: Secondary | ICD-10-CM | POA: Diagnosis not present

## 2021-04-28 DIAGNOSIS — J441 Chronic obstructive pulmonary disease with (acute) exacerbation: Secondary | ICD-10-CM | POA: Diagnosis not present

## 2021-04-28 DIAGNOSIS — J9622 Acute and chronic respiratory failure with hypercapnia: Secondary | ICD-10-CM | POA: Diagnosis not present

## 2021-04-28 LAB — CBC
HCT: 37.7 % — ABNORMAL LOW (ref 39.0–52.0)
Hemoglobin: 11.7 g/dL — ABNORMAL LOW (ref 13.0–17.0)
MCH: 31 pg (ref 26.0–34.0)
MCHC: 31 g/dL (ref 30.0–36.0)
MCV: 100 fL (ref 80.0–100.0)
Platelets: 142 10*3/uL — ABNORMAL LOW (ref 150–400)
RBC: 3.77 MIL/uL — ABNORMAL LOW (ref 4.22–5.81)
RDW: 13 % (ref 11.5–15.5)
WBC: 6.8 10*3/uL (ref 4.0–10.5)
nRBC: 0 % (ref 0.0–0.2)

## 2021-04-28 LAB — BASIC METABOLIC PANEL
Anion gap: 5 (ref 5–15)
BUN: 8 mg/dL (ref 8–23)
CO2: 43 mmol/L — ABNORMAL HIGH (ref 22–32)
Calcium: 8.5 mg/dL — ABNORMAL LOW (ref 8.9–10.3)
Chloride: 85 mmol/L — ABNORMAL LOW (ref 98–111)
Creatinine, Ser: 0.41 mg/dL — ABNORMAL LOW (ref 0.61–1.24)
GFR, Estimated: >60 mL/min (ref >60)
Glucose, Bld: 147 mg/dL — ABNORMAL HIGH (ref 70–99)
Potassium: 3.7 mmol/L (ref 3.5–5.1)
Sodium: 133 mmol/L — ABNORMAL LOW (ref 135–145)

## 2021-04-28 LAB — I-STAT ARTERIAL BLOOD GAS, ED
Acid-Base Excess: 19 mmol/L — ABNORMAL HIGH (ref 0.0–2.0)
Bicarbonate: 45.5 mmol/L — ABNORMAL HIGH (ref 20.0–28.0)
Calcium, Ion: 1.12 mmol/L — ABNORMAL LOW (ref 1.15–1.40)
HCT: 36 % — ABNORMAL LOW (ref 39.0–52.0)
Hemoglobin: 12.2 g/dL — ABNORMAL LOW (ref 13.0–17.0)
O2 Saturation: 95 %
Patient temperature: 98.2
Potassium: 3.6 mmol/L (ref 3.5–5.1)
Sodium: 133 mmol/L — ABNORMAL LOW (ref 135–145)
TCO2: 47 mmol/L — ABNORMAL HIGH (ref 22–32)
pCO2 arterial: 60.2 mmHg — ABNORMAL HIGH (ref 32.0–48.0)
pH, Arterial: 7.485 — ABNORMAL HIGH (ref 7.350–7.450)
pO2, Arterial: 73 mmHg — ABNORMAL LOW (ref 83.0–108.0)

## 2021-04-28 LAB — URINALYSIS, ROUTINE W REFLEX MICROSCOPIC
Bilirubin Urine: NEGATIVE
Glucose, UA: NEGATIVE mg/dL
Hgb urine dipstick: NEGATIVE
Ketones, ur: 20 mg/dL — AB
Leukocytes,Ua: NEGATIVE
Nitrite: NEGATIVE
Protein, ur: NEGATIVE mg/dL
Specific Gravity, Urine: 1.024 (ref 1.005–1.030)
pH: 7 (ref 5.0–8.0)

## 2021-04-28 LAB — BLOOD GAS, ARTERIAL
Acid-Base Excess: 18.5 mmol/L — ABNORMAL HIGH (ref 0.0–2.0)
Bicarbonate: 46.3 mmol/L — ABNORMAL HIGH (ref 20.0–28.0)
FIO2: 52
O2 Saturation: 99.3 %
Patient temperature: 36.8
pCO2 arterial: 108 mmHg (ref 32.0–48.0)
pH, Arterial: 7.255 — ABNORMAL LOW (ref 7.350–7.450)
pO2, Arterial: 199 mmHg — ABNORMAL HIGH (ref 83.0–108.0)

## 2021-04-28 LAB — HIV ANTIBODY (ROUTINE TESTING W REFLEX): HIV Screen 4th Generation wRfx: NONREACTIVE

## 2021-04-28 MED ORDER — NICOTINE 21 MG/24HR TD PT24
21.0000 mg | MEDICATED_PATCH | TRANSDERMAL | Status: DC
  Administered 2021-04-29 – 2021-05-02 (×4): 21 mg via TRANSDERMAL
  Filled 2021-04-28 (×4): qty 1

## 2021-04-28 MED ORDER — IPRATROPIUM-ALBUTEROL 0.5-2.5 (3) MG/3ML IN SOLN
3.0000 mL | RESPIRATORY_TRACT | Status: DC
  Administered 2021-04-28 (×3): 3 mL via RESPIRATORY_TRACT
  Filled 2021-04-28 (×3): qty 3

## 2021-04-28 MED ORDER — SODIUM CHLORIDE 0.9 % IV SOLN
500.0000 mg | INTRAVENOUS | Status: AC
  Administered 2021-04-28 – 2021-04-30 (×3): 500 mg via INTRAVENOUS
  Filled 2021-04-28 (×3): qty 500

## 2021-04-28 MED ORDER — METHYLPREDNISOLONE SODIUM SUCC 125 MG IJ SOLR
90.0000 mg | Freq: Two times a day (BID) | INTRAMUSCULAR | Status: DC
  Administered 2021-04-28 – 2021-04-29 (×3): 90 mg via INTRAVENOUS
  Filled 2021-04-28 (×3): qty 2

## 2021-04-28 MED ORDER — IPRATROPIUM-ALBUTEROL 0.5-2.5 (3) MG/3ML IN SOLN: 3.0000 mL | Freq: Four times a day (QID) | RESPIRATORY_TRACT | Status: DC

## 2021-04-28 MED ORDER — GLYCERIN (LAXATIVE) 2 G RE SUPP: 1.0000 | Freq: Once | RECTAL | Status: DC

## 2021-04-28 MED ORDER — CHLORHEXIDINE GLUCONATE CLOTH 2 % EX PADS
6.0000 | MEDICATED_PAD | Freq: Every day | CUTANEOUS | Status: DC
  Administered 2021-04-28: 6 via TOPICAL

## 2021-04-28 MED ORDER — ALBUTEROL SULFATE (2.5 MG/3ML) 0.083% IN NEBU: 2.5000 mg | INHALATION_SOLUTION | RESPIRATORY_TRACT | Status: DC | PRN

## 2021-04-28 NOTE — Progress Notes (Signed)
Pt transported on Bipap from [place] to 2W31 without any  complications. Vitals stable, RN at bedside, RT will continue to monitor.

## 2021-04-28 NOTE — ED Notes (Signed)
Patient called out stating he couldn't breath sats 88% on 3 L [place], switched patient to 100% NRB sats up to 100%

## 2021-04-28 NOTE — Progress Notes (Signed)
Family Medicine Teaching Service Daily Progress Note Intern Pager: [phone]  Patient name: Bobby Caldwell Medical record number: [id number] Date of birth: 26-Nov-1955 Age: 65 y.o. Gender: male  Primary Care Provider: [name], MD Consultants: none Code Status: FULL  Pt Overview and Major Events to Date:  10/19: admitted, placed on BiPAP  Assessment and Plan: The patient is a 65 year old male presenting with worsening fatigue and lethargy and decreased PO intake. PMH is notable for COPD (3-4L O2 at baseline), gastric adenocarcinoma s/p treatment and no known recurrence, and malnutrition.  Acute on chronic hypercapnic respiratory failure Family noted increased lethargy and confusion as well as SoB for the past 3 days. VBG of 7.3/106/46/55, CXR unremarkable. Thought to be COPD exacerbation. Placed on BiPAP. Weaned to 3L [place]. Patient reported SoB overnight (sats of 88%) and was placed on NRB. Weaned to [place]. However, this AM patient desatted to 79% and was tachypneic. ABG with pH 7.26/pCO2 back up to 108 (had improved).  -Start BiPAP -O2 sat 88-92% -Solumedrol 90 mg BID, IV azithro -Duonebs q4hrs and albuterol q2hrs PRN -Repeat CXR -Continue to monitor closely, low threshold to call ICU -mIVF at 100 mL/hr given poor PO -Goals of care discussion with family  Altered mental status likely 2/2 hypercarbia CTH negative for acute anomaly, electrolytes unremarkable, UA and CXR unremarkable, ethanol <10, ammonia wnl.  -f/u HIV antibody  ABD tenderness, Hx of gastric adenocarcinoma Diagnosed in 05/2017, treated with neoadjuvant chemo and partial gastrectomy. Last endoscopy in 2021 showed no evidence of recurrence. LUQ tenderness noted, unsure if patient has chronic pain.  -reach out to family for clarification  Malnutrition Patient clearly cachectic.  -RD consult  Tobacco use disorder -Nicotine patch  FEN/GI: NPO given BiPAP PPx: heparin Dispo: ongoing discussion with family  Subjective:   On examination this morning patient is alert and provides intelligible answers, but does not appear to understand questions being asked. Plan is to reach out to family to better clarify mental status and facilitate conversation.   Objective: Temp:  [98.2 F (36.8 C)-98.9 F (37.2 C)] 98.2 F (36.8 C) (10/20 0356) Pulse Rate:  [25-112] 82 (10/20 0356) Resp:  [10-25] 18 (10/20 0356) BP: (92-150)/(62-139) 140/72 (10/20 0356) SpO2:  [86 %-100 %] 100 % (10/20 0356) FiO2 (%):  [30 %-50 %] 30 % (10/19 1627) Physical Exam Vitals reviewed.  Cardiovascular:     Rate and Rhythm: Normal rate and regular rhythm.  Pulmonary:     Breath sounds: Wheezing present.     Comments: Increased work of breathing Abdominal:     General: Bowel sounds are normal.     Palpations: Abdomen is soft.     Tenderness: There is abdominal tenderness.  Skin:    General: Skin is warm and dry.  Neurological:     Mental Status: He is alert.    Laboratory: Recent Labs  Lab 04/27/21 1102 04/27/21 1112 04/27/21 1643 04/28/21 0332  WBC 5.2  --   --  6.8  HGB 10.8* 11.9* 12.2* 11.7*  HCT 35.5* 35.0* 36.0* 37.7*  PLT 146*  --   --  142*   Recent Labs  Lab 04/27/21 1102 04/27/21 1112 04/27/21 1643 04/28/21 0332  NA 138 137 135 133*  K 4.1 4.0 4.2 3.7  CL 85*  --   --  85*  CO2 >45*  --   --  43*  BUN 8  --   --  8  CREATININE 0.40*  --   --  0.41*  CALCIUM 8.2*  --   --  8.5*  PROT 5.0*  --   --   --   BILITOT 0.5  --   --   --   ALKPHOS 58  --   --   --   ALT 15  --   --   --   AST 14*  --   --   --   GLUCOSE 90  --   --  147*    Imaging/Diagnostic Tests: CXR and ABG as above   Corky Sox, MD PGY-1 [place] Intern pager: [phone], text pages welcome

## 2021-04-28 NOTE — ED Notes (Signed)
Received call from Secretary while in another room that this pt was desatting to 67%.  I entered pt's room to find him off the [place], sitting at end of bed peeing on floor and sitting in stool on the bed.  I called for help and myself and Josiah-NT proceeded to move him back in bed. He normally wears 3L so I placed him on 6L [place] but he was only rebounding to 75% so we used NRB which returned him to 100%.  We kept him on NRB while cleaning up the pt , his bed and the room.  Once everything was straight again I weaned him back to 3L and he maintained 100%.

## 2021-04-28 NOTE — Progress Notes (Signed)
FMTS Brief Note  In to see patient after Dr. Alvie Heidelberg. He is tolerating 4 L very well. [place] interpreter used throughout encounter. He knows his name, wifes name, and that is breathing 'was not good but better now'. Does not know year, which hospital he is in, or which cancer he previously was treated for. Per interpreter, patient not making sense with regards with [place] phrases. Breathing improved from prior. Lung air entry improved from this AM. No retractions. No abdominal pain on my exam. Can have soft diet now. May need BiPap overnight. Will continue to try to reach out to family regarding goals of care. Per family,  his mental status (similar to today) have been ongoing several weeks.  Dorris Singh, MD  Family Medicine Teaching Service

## 2021-04-28 NOTE — Progress Notes (Signed)
PT taken off Bipap and placed on 4L [place] per MD. Pt tolerating well at this time, MD aware,vitals stable, RN at bedside, RT will continue to monitor

## 2021-04-28 NOTE — Progress Notes (Signed)
Pt placed on Bipap by RT. Pt tolerating well at this time, RN aware, MD aware, RT will continue to monitor.

## 2021-04-28 NOTE — Progress Notes (Signed)
FMTS Brief Note  Met with patient's daughter Janit Pagan) and wife at bedside. Declined interpreter, daughter interpreted for patient.   Mental status-- per daughter, has been worsening for 2-3 weeks. Daughter and wife have noted change and significant decline for many months. He has had a decline in interests, appetite, intermittent confusion. At baseline, he is able to be more conversant but it seems is typically not oriented. Evaluation for dementia in process (TSH, B12, HIV already negative).   Respiratory status- Samire reports also near baseline. Breathing improved. We discussed his severe lung disease, current treatment. We discussed that if condition worsened overnight, escalation of care could include intubation/CPR. Discussed full code status. Discussed that given comorbid disease states, intubation would likely be very challenging/difficult to wean from ventilator. Daughter aware of her father's declining status. Wife Rickard Patience) voiced some understanding. They are spiritual, would like to discuss next steps as family. Agreeable to continued conversation as they agree Mr. Schweiss would not want prolonged intubation, very much dislikes hospitals. Palliative care consulted, will likely see 10.21-10.22.   All questions answered. Daughter will return 10/21 around noon. Rickard Patience is staying overnight.  Dorris Singh, MD  Family Medicine Teaching Service

## 2021-04-28 NOTE — ED Notes (Signed)
Patient got up of the end of the stretcher and urinated and defecated in the floor, assisted patient back to stretcher and cleaned him up and  new linens applied. Patient placed backed on bipap.

## 2021-04-28 NOTE — Progress Notes (Addendum)
FMTS Brief Note In to see patient with result of BG. On arrival in room, patient's oygen saturation 79% on RA. Sitting on edge of bed, tachypnea, and will appearing. He is able to speak in short sentences. He had to void and pulled off oxygen/BiPap. Discussed with nursing. Respiratory failure worsening, ABG shows worsening hypercapnia, concern he is going to worsen and require ICU level care. - Dr. Rock Nephew reached out to family, they will be in.  - Repeat chest -ray  - Solumedrol 60 q8h, IV azithromycin  - Nebulizers ordered - Low threshold to call ICU pending clinical progression, suspect he may need escalation of care.  - Given gastric cancer/COPD/malnutrition, will consult palliative.   Discussed with RN, Dr. Volanda Napoleon.   Dorris Singh, MD  Family Medicine Teaching Service

## 2021-04-29 DIAGNOSIS — G9341 Metabolic encephalopathy: Secondary | ICD-10-CM | POA: Diagnosis not present

## 2021-04-29 DIAGNOSIS — E878 Other disorders of electrolyte and fluid balance, not elsewhere classified: Secondary | ICD-10-CM

## 2021-04-29 DIAGNOSIS — J9622 Acute and chronic respiratory failure with hypercapnia: Secondary | ICD-10-CM | POA: Diagnosis not present

## 2021-04-29 LAB — COMPREHENSIVE METABOLIC PANEL
ALT: 15 U/L (ref 0–44)
AST: 18 U/L (ref 15–41)
Albumin: 3.2 g/dL — ABNORMAL LOW (ref 3.5–5.0)
Alkaline Phosphatase: 56 U/L (ref 38–126)
Anion gap: 7 (ref 5–15)
BUN: 9 mg/dL (ref 8–23)
CO2: 40 mmol/L — ABNORMAL HIGH (ref 22–32)
Calcium: 8.7 mg/dL — ABNORMAL LOW (ref 8.9–10.3)
Chloride: 88 mmol/L — ABNORMAL LOW (ref 98–111)
Creatinine, Ser: 0.42 mg/dL — ABNORMAL LOW (ref 0.61–1.24)
GFR, Estimated: >60 mL/min (ref >60)
Glucose, Bld: 145 mg/dL — ABNORMAL HIGH (ref 70–99)
Potassium: 3.3 mmol/L — ABNORMAL LOW (ref 3.5–5.1)
Sodium: 135 mmol/L (ref 135–145)
Total Bilirubin: 0.8 mg/dL (ref 0.3–1.2)
Total Protein: 5.6 g/dL — ABNORMAL LOW (ref 6.5–8.1)

## 2021-04-29 LAB — CBC WITH DIFFERENTIAL/PLATELET
Abs Immature Granulocytes: 0.01 10*3/uL (ref 0.00–0.07)
Basophils Absolute: 0 10*3/uL (ref 0.0–0.1)
Basophils Relative: 0 %
Eosinophils Absolute: 0 10*3/uL (ref 0.0–0.5)
Eosinophils Relative: 0 %
HCT: 34.6 % — ABNORMAL LOW (ref 39.0–52.0)
Hemoglobin: 11.6 g/dL — ABNORMAL LOW (ref 13.0–17.0)
Immature Granulocytes: 0 %
Lymphocytes Relative: 8 %
Lymphs Abs: 0.2 10*3/uL — ABNORMAL LOW (ref 0.7–4.0)
MCH: 31.4 pg (ref 26.0–34.0)
MCHC: 33.5 g/dL (ref 30.0–36.0)
MCV: 93.8 fL (ref 80.0–100.0)
Monocytes Absolute: 0.1 10*3/uL (ref 0.1–1.0)
Monocytes Relative: 5 %
Neutro Abs: 2.2 10*3/uL (ref 1.7–7.7)
Neutrophils Relative %: 87 %
Platelets: 169 10*3/uL (ref 150–400)
RBC: 3.69 MIL/uL — ABNORMAL LOW (ref 4.22–5.81)
RDW: 13.3 % (ref 11.5–15.5)
WBC: 2.6 10*3/uL — ABNORMAL LOW (ref 4.0–10.5)
nRBC: 0 % (ref 0.0–0.2)

## 2021-04-29 LAB — T4, FREE: Free T4: 0.82 ng/dL (ref 0.61–1.12)

## 2021-04-29 LAB — VITAMIN B12: Vitamin B-12: 529 pg/mL (ref 180–914)

## 2021-04-29 LAB — RPR: RPR Ser Ql: NONREACTIVE

## 2021-04-29 LAB — PHOSPHORUS: Phosphorus: 2.2 mg/dL — ABNORMAL LOW (ref 2.5–4.6)

## 2021-04-29 LAB — TSH: TSH: 0.131 u[IU]/mL — ABNORMAL LOW (ref 0.350–4.500)

## 2021-04-29 LAB — MAGNESIUM: Magnesium: 1.7 mg/dL (ref 1.7–2.4)

## 2021-04-29 MED ORDER — SODIUM CHLORIDE 0.9% FLUSH: 10.0000 mL | INTRAVENOUS | Status: DC | PRN

## 2021-04-29 MED ORDER — CHLORHEXIDINE GLUCONATE CLOTH 2 % EX PADS
6.0000 | MEDICATED_PAD | Freq: Every day | CUTANEOUS | Status: DC
  Administered 2021-04-30 – 2021-05-02 (×3): 6 via TOPICAL

## 2021-04-29 MED ORDER — METHYLPREDNISOLONE SODIUM SUCC 125 MG IJ SOLR
60.0000 mg | Freq: Two times a day (BID) | INTRAMUSCULAR | Status: DC
  Administered 2021-04-29: 60 mg via INTRAVENOUS
  Filled 2021-04-29: qty 2

## 2021-04-29 MED ORDER — GLYCERIN NICU SUPPOSITORY (CHIP): 1.0000 | Freq: Once | RECTAL | Status: DC

## 2021-04-29 MED ORDER — POTASSIUM PHOSPHATES 15 MMOLE/5ML IV SOLN
15.0000 mmol | Freq: Once | INTRAVENOUS | Status: AC
  Administered 2021-04-29: 15 mmol via INTRAVENOUS
  Filled 2021-04-29: qty 5

## 2021-04-29 MED ORDER — POTASSIUM CHLORIDE 10 MEQ/100ML IV SOLN
10.0000 meq | INTRAVENOUS | Status: AC
  Administered 2021-04-29 (×2): 10 meq via INTRAVENOUS
  Filled 2021-04-29 (×2): qty 100

## 2021-04-29 MED ORDER — THIAMINE HCL 100 MG/ML IJ SOLN
100.0000 mg | Freq: Every day | INTRAMUSCULAR | Status: DC
  Administered 2021-04-29 – 2021-05-01 (×3): 100 mg via INTRAVENOUS
  Filled 2021-04-29 (×3): qty 2

## 2021-04-29 MED ORDER — IPRATROPIUM-ALBUTEROL 0.5-2.5 (3) MG/3ML IN SOLN
3.0000 mL | Freq: Four times a day (QID) | RESPIRATORY_TRACT | Status: DC
  Administered 2021-04-29 – 2021-05-01 (×11): 3 mL via RESPIRATORY_TRACT
  Filled 2021-04-29 (×12): qty 3

## 2021-04-29 MED ORDER — SODIUM CHLORIDE 0.9% FLUSH
10.0000 mL | Freq: Two times a day (BID) | INTRAVENOUS | Status: DC
  Administered 2021-04-29 – 2021-04-30 (×2): 10 mL
  Administered 2021-04-30: 30 mL
  Administered 2021-05-01 – 2021-05-02 (×3): 10 mL

## 2021-04-29 MED ORDER — POTASSIUM CHLORIDE CRYS ER 20 MEQ PO TBCR: 40.0000 meq | EXTENDED_RELEASE_TABLET | Freq: Once | ORAL | Status: DC

## 2021-04-29 MED ORDER — POLYETHYLENE GLYCOL 3350 17 G PO PACK
17.0000 g | PACK | Freq: Two times a day (BID) | ORAL | Status: DC
  Administered 2021-05-01 – 2021-05-02 (×3): 17 g via ORAL
  Filled 2021-04-29 (×4): qty 1

## 2021-04-29 MED ORDER — GLYCERIN (LAXATIVE) 2 G RE SUPP
1.0000 | Freq: Once | RECTAL | Status: AC
  Administered 2021-04-29: 1 via RECTAL
  Filled 2021-04-29: qty 1

## 2021-04-29 MED ORDER — DEXTROSE-NACL 5-0.45 % IV SOLN
INTRAVENOUS | Status: DC
Start: 2021-04-29 — End: 2021-05-01

## 2021-04-29 NOTE — Evaluation (Signed)
Physical Therapy Evaluation Patient Details Name: Bobby Caldwell MRN: [id number] DOB: Aug 26, 1955 Today's Date: 04/29/2021  History of Present Illness  Pt is a 65 y/o male admitted on 04/27/21 with worsening fatigue, lethargy, and decreased PO intake. Admitted acute on chronic hypercapnic respiratory failure, AMS and malnutrition. PMH includes: COPD (3-4 L O2 at baseline), gastric adenocarcinoma s/p treatment and no known recurrence, and malnutrition.   Clinical Impression  PT eval complete. PTA pt lived at home with his wife. He required assist with all ADLs and mobility, only ambulating bed to bathroom. On eval, pt required supervision bed mobility, min guard assist transfers, and min assist ambulation 5' without AD. Mobilized on 3L. SpO2 100% prior to mobility and 90% upon return to bed. Pt appears to be at baseline for functional mobility. No further skilled PT intervention indicated. Recommend w/c to assist family with mobility in home and community due to pt's poor activity tolerance.       Recommendations for follow up therapy are one component of a multi-disciplinary discharge planning process, led by the attending physician.  Recommendations may be updated based on patient status, additional functional criteria and insurance authorization.  Follow Up Recommendations No PT follow up;Supervision for mobility/OOB    Equipment Recommendations  Wheelchair (measurements PT);Wheelchair cushion (measurements PT)    Recommendations for Other Services       Precautions / Restrictions Precautions Precautions: Fall;Other (comment) Precaution Comments: watch O2      Mobility  Bed Mobility Overal bed mobility: Needs Assistance Bed Mobility: Supine to Sit;Sit to Supine     Supine to sit: Supervision;HOB elevated Sit to supine: Supervision;HOB elevated   General bed mobility comments: for lines    Transfers Overall transfer level: Needs assistance   Transfers: Sit to/from Stand Sit  to Stand: Min guard         General transfer comment: min guard for safety and line mgmt  Ambulation/Gait Ambulation/Gait assistance: Min assist Gait Distance (Feet): 5 Feet Assistive device: 1 person hand held assist Gait Pattern/deviations: Step-through pattern;Decreased stride length     General Gait Details: limited by fatigue. Mobilized on 3L with desat into 80s but with poor pleth. Upon return to bed, good pleth present and SpO2 90%.  Stairs            Wheelchair Mobility    Modified Rankin (Stroke Patients Only)       Balance Overall balance assessment: Mild deficits observed, not formally tested                                           Pertinent Vitals/Pain Pain Assessment: Faces Faces Pain Scale: Hurts little more Pain Location: condom cath Pain Descriptors / Indicators: Discomfort;Grimacing Pain Intervention(s): Monitored during session    Home Living Family/patient expects to be discharged to:: Private residence Living Arrangements: Spouse/significant other Available Help at Discharge: Family;Available PRN/intermittently Type of Home: House Home Access: Stairs to enter Entrance Stairs-Rails: Right Entrance Stairs-Number of Steps: 4 Home Layout: One level Home Equipment: Shower seat      Prior Function Level of Independence: Needs assistance   Gait / Transfers Assistance Needed: minimal ambulation, spouse assist  ADL's / Homemaking Assistance Needed: spouse assists with bathing, dressing, toileting        Hand Dominance   Dominant Hand: Right    Extremity/Trunk Assessment   Upper Extremity Assessment Upper Extremity Assessment: Generalized weakness  Lower Extremity Assessment Lower Extremity Assessment: Generalized weakness    Cervical / Trunk Assessment Cervical / Trunk Assessment: Kyphotic  Communication   Communication: Prefers language other than English ([place]- spouse present and interprets)   Cognition Arousal/Alertness: Awake/alert Behavior During Therapy: Flat affect Overall Cognitive Status: Difficult to assess                                 General Comments: pt following simple commands and engaging appropriately to inform therapist of his needs, difficult to determine if this is different then baseline.      General Comments General comments (skin integrity, edema, etc.): Pt weaned from 8L to 3L just prior to eval. SpO2 100% at rest in bed. Desat into 80s with activity but with poor pleth. SpO2 90% upon return to bed.    Exercises     Assessment/Plan    PT Assessment Patent does not need any further PT services  PT Problem List         PT Treatment Interventions      PT Goals (Current goals can be found in the Care Plan section)  Acute Rehab PT Goals Patient Stated Goal: home PT Goal Formulation: All assessment and education complete, DC therapy    Frequency     Barriers to discharge        Co-evaluation PT/OT/SLP Co-Evaluation/Treatment: Yes Reason for Co-Treatment: Necessary to address cognition/behavior during functional activity;To address functional/ADL transfers PT goals addressed during session: Mobility/safety with mobility;Balance         AM-PAC PT "6 Clicks" Mobility  Outcome Measure Help needed turning from your back to your side while in a flat bed without using bedrails?: None Help needed moving from lying on your back to sitting on the side of a flat bed without using bedrails?: A Little Help needed moving to and from a bed to a chair (including a wheelchair)?: A Little Help needed standing up from a chair using your arms (e.g., wheelchair or bedside chair)?: A Little Help needed to walk in hospital room?: A Little Help needed climbing 3-5 steps with a railing? : A Little 6 Click Score: 19    End of Session Equipment Utilized During Treatment: Oxygen Activity Tolerance: Patient limited by fatigue Patient left: in  bed;with call bell/phone within reach;with bed alarm set;with family/visitor present Nurse Communication: Mobility status PT Visit Diagnosis: Muscle weakness (generalized) (M62.81)    Time: 7741-2878 PT Time Calculation (min) (ACUTE ONLY): 24 min   Charges:   PT Evaluation $PT Eval Moderate Complexity: 1 Mod          Lorrin Goodell, PT  Office # [phone] Pager [phone]   Lorriane Shire 04/29/2021, 12:07 PM

## 2021-04-29 NOTE — Progress Notes (Signed)
Family Medicine Teaching Service Daily Progress Note Intern Pager: [phone]  Patient name: Bobby Caldwell Medical record number: [id number] Date of birth: [birth year] Age: 65 y.o. Gender: male  Primary Care Provider: [name], MD Consultants: palliative care Code Status: FULL  Pt Overview and Major Events to Date:  10/19: admitted, placed on BiPAP 10/20: continues to require BiPAP, briefly weaned to [facility]  Assessment and Plan: Bobby Caldwell is a 65 year old male presenting with worsening fatigue and lethargy and decreased PO intake. PMH is notable for COPD (on 3-4L home O2), gastric adenocarcinoma s/p chemo and partial gastrectomy.   Acute on chronic hypercarbic respiratory failure Likely COPD exacerbation given lack of infectious signs (no leukocytosis, fever, negative CXR). Per chart review patient had PFTs several years ago with an FEV1/FVC of 46%. No desaturations noted but patient placed on BiPAP overnight given poor respiratory status. This AM patient taken off BiPAP and weaned to 3L [place]. On examination his work of breathing has lessened significantly (likely due to reduction in CO2 burden). Unfortunately he has persistent disorientation: wife is at bedside and provides interpretation, able to state his name but does not answer further questions correctly. Overall, improved from a respiratory standpoint but still with subacute AMS.  -O2 sats 88-92% -Decrease solumedrol to 60 mg BID, IV azithro -Duonebs q4hrs, albuterol q2hrs PRN -Switch mIVF to D5-1/2NS at 75 mL/hr -IV thiamine 100 mg daily -SLP -re-assess patient this afternoon -BiPAP ON -BS q shift -Glycerin suppository  Altered mental status likely 2/2 hypercarbia Progressive for the last several weeks, likely secondary to CO2 retention. However, rule out labs/imaging obtained. Negative CTH, HIV negative.  -f/u RPR  Goals of care Ongoing discussion with family regarding code status, both mother and daughter have expressed their  desire for him to be a full code. Plan is for family meeting tomorrow.  -Palliative care consulted -Further discussion with family tomorrow   Low TSH Concern for hyperthyroidism given TSH of 0.13.  -Free T3/T4  Hypokalemia K of 3.2 this AM.  -2x10 meq IV  Hypophosphatemia Phos of 2.2 this AM, likely secondary to metabolic expenditures and could be some component of refeeding.  -15 mmol Kphos -phos tomorrow  ABD tenderness, Hx of gastric adenocarcinoma Stable. Patient has had a BM since admission.   Malnutrition -RD to see patient  Tobacco use disorder -nicotine patch  Leukopenia WBCs of 1.3 this AM, previously 6.8 on admission. Suprising finding given patient is on high dose steroids.  -recheck today with diff, may be an erroneous value  FEN/GI: NPO, SLP eval (failed bedside swallow), mIVF as above PPx: heparin Dispo: TBD pending discussion with palliative and TOC  Subjective:  Interview done with the help of wife and daughter who provide interpretation. Bobby Caldwell is alert and oriented to person but is unable to provide the month or year. He reports continued shortness of breath that is frustrating for him.   Objective: Temp:  [98 F (36.7 C)-99.8 F (37.7 C)] 98 F (36.7 C) (10/21 0501) Pulse Rate:  [65-96] 87 (10/21 0501) Resp:  [15-30] 20 (10/21 0501) BP: (102-159)/(69-104) 126/85 (10/21 0501) SpO2:  [98 %-100 %] 100 % (10/21 0501) FiO2 (%):  [30 %] 30 % (10/20 1219) Weight:  [78 lb 11.3 oz (35.7 kg)] 78 lb 11.3 oz (35.7 kg) (10/20 1436) Physical Exam Vitals reviewed.  Cardiovascular:     Rate and Rhythm: Normal rate and regular rhythm.     Heart sounds: No murmur heard. Pulmonary:     Comments:  Increased work of breathing, decreased breath sounds bilaterally Abdominal:     Palpations: Abdomen is soft.     Tenderness: There is no abdominal tenderness.  Neurological:     Mental Status: He is alert.     Laboratory: Recent Labs  Lab 04/27/21 1102  04/27/21 1112 04/28/21 0332 04/28/21 1328 04/29/21 0434  WBC 5.2  --  6.8  --  PENDING  HGB 10.8*   < > 11.7* 12.2* 11.2*  HCT 35.5*   < > 37.7* 36.0* 33.4*  PLT 146*  --  142*  --  150   < > = values in this interval not displayed.   Recent Labs  Lab 04/27/21 1102 04/27/21 1112 04/28/21 0332 04/28/21 1328 04/29/21 0249  NA 138   < > 133* 133* 135  K 4.1   < > 3.7 3.6 3.3*  CL 85*  --  85*  --  88*  CO2 >45*  --  43*  --  40*  BUN 8  --  8  --  9  CREATININE 0.40*  --  0.41*  --  0.42*  CALCIUM 8.2*  --  8.5*  --  8.7*  PROT 5.0*  --   --   --  5.6*  BILITOT 0.5  --   --   --  0.8  ALKPHOS 58  --   --   --  56  ALT 15  --   --   --  15  AST 14*  --   --   --  18  GLUCOSE 90  --  147*  --  145*   < > = values in this interval not displayed.    Imaging/Diagnostic Tests: CBC as above   Corky Sox, MD PGY-1 [place] Intern pager: [phone], text pages welcome

## 2021-04-29 NOTE — Evaluation (Signed)
Clinical/Bedside Swallow Evaluation Patient Details  Name: Bobby Caldwell MRN: [id number] Date of Birth: 1956/03/31  Today's Date: 04/29/2021 Time: SLP Start Time (ACUTE ONLY): 1400 SLP Stop Time (ACUTE ONLY): 1415 SLP Time Calculation (min) (ACUTE ONLY): 15 min  Past Medical History:  Past Medical History:  Diagnosis Date   COPD (chronic obstructive pulmonary disease) ([place])    Dyspnea    PAD (peripheral artery disease) ([facility])    Pneumothorax    Stomach cancer ([place])    Supplemental oxygen dependent    3.5-4 L   Tobacco abuse    Weight loss    Past Surgical History:  Past Surgical History:  Procedure Laterality Date   ABDOMINAL AORTOGRAM W/LOWER EXTREMITY N/A 05/03/2017   Procedure: ABDOMINAL AORTOGRAM W/LOWER EXTREMITY;  Surgeon: Conrad Alger, MD;  Location: [facility];  Service: Cardiovascular;  Laterality: N/A;   BIOPSY  08/22/2018   Procedure: BIOPSY;  Surgeon: [name], MD;  Location: [facility];  Service: Endoscopy;;   BIOPSY  07/31/2019   Procedure: BIOPSY;  Surgeon: [name], MD;  Location: [facility];  Service: Endoscopy;;   ESOPHAGOGASTRODUODENOSCOPY     05/24/17   ESOPHAGOGASTRODUODENOSCOPY (EGD) WITH PROPOFOL N/A 05/24/2017   Procedure: ESOPHAGOGASTRODUODENOSCOPY (EGD) WITH PROPOFOL;  Surgeon: [name], MD;  Location: [facility];  Service: Endoscopy;  Laterality: N/A;   ESOPHAGOGASTRODUODENOSCOPY (EGD) WITH PROPOFOL N/A 08/22/2018   Procedure: ESOPHAGOGASTRODUODENOSCOPY (EGD) WITH PROPOFOL;  Surgeon: [name], MD;  Location: [facility];  Service: Endoscopy;  Laterality: N/A;   ESOPHAGOGASTRODUODENOSCOPY (EGD) WITH PROPOFOL N/A 11/28/2018   Procedure: ESOPHAGOGASTRODUODENOSCOPY (EGD) WITH PROPOFOL;  Surgeon: [name], MD;  [facility]: [facility];  Service: Endoscopy;  Laterality: N/A;   ESOPHAGOGASTRODUODENOSCOPY (EGD) WITH PROPOFOL N/A 07/31/2019   Procedure: ESOPHAGOGASTRODUODENOSCOPY (EGD) WITH PROPOFOL;  Surgeon:  [name], MD;  Location: [facility];  Service: Endoscopy;  Laterality: N/A;   EUS N/A 06/07/2017   Procedure: UPPER ENDOSCOPIC ULTRASOUND (EUS) RADIAL;  Surgeon: [name], MD;  Location: [facility];  Service: Endoscopy;  Laterality: N/A;   FEMORAL-FEMORAL BYPASS GRAFT Bilateral 05/08/2017   Procedure: LEFT TO RIGHT BYPASS GRAFT FEMORAL-FEMORAL ARTERY;  Surgeon: Conrad Buckhorn, MD;  Location: [place];  Service: Vascular;  Laterality: Bilateral;   IR FLUORO GUIDE PORT INSERTION RIGHT  07/05/2017   IR US GUIDE VASC ACCESS RIGHT  07/05/2017   LAPAROSCOPIC INSERTION GASTROSTOMY TUBE N/A 11/27/2017   Procedure: JEJUNOSTOMY TUBE PLACEMENT;  Surgeon: [name], MD;  Location: [place];  Service: General;  Laterality: N/A;   LAPAROSCOPIC PARTIAL GASTRECTOMY N/A 11/27/2017   Procedure: PARTIAL GASTRECTOMY;  Surgeon: [name], MD;  Location: [place];  Service: General;  Laterality: N/A;   LAPAROSCOPY N/A 11/27/2017   Procedure: LAPAROSCOPY DIAGNOSTIC;  Surgeon: [name], MD;  Location: [place];  Service: General;  Laterality: N/A;   NM MYOVIEW LTD  06/2017   LOW RISK. Small basal inferoseptal defect thought to be related to artifact (although cannot exclude prior infarct). Normal wall motion i in this area would suggest artifact not infarct. Otherwise no ischemia or infarction.   PERIPHERAL VASCULAR INTERVENTION Left 05/03/2017   Procedure: PERIPHERAL VASCULAR INTERVENTION;  Surgeon: Conrad Ellsworth, MD;  Location: [facility];  Service: Cardiovascular;  Laterality: Left;  common iliac   POLYPECTOMY  11/28/2018   Procedure: POLYPECTOMY;  Surgeon: [name], MD;  Location: [facility];  Service: Endoscopy;;   TRANSTHORACIC ECHOCARDIOGRAM  06/26/2017   Normal LV size and function.  EF 60-65%. No wall motion abnormalities. GR 1 DD. Mild LA dilation   HPI:  The patient is a 65 year old male presenting with worsening fatigue and lethargy and decreased PO intake. Found to have  "acute on chronic hypoxic and hypercarbic respiratory failure, likely secondary to acute exacerbation of very severe COPD.  This is complicated by respiratory acidosis due to hypercarbic respiratory failure. " PMH is notable for COPD (3-4L O2 at baseline), gastric adenocarcinoma s/p treatment and no known recurrence, and malnutrition.    Assessment / Plan / Recommendation  Clinical Impression  Pt demonstrates a mild respiratory based dysphagia. He has difficulty drinking consecutive sips given stress it puts on his breathing pattern. When taking small sips there are no signs of aspiration and pt is able to tolerate well. He is generally reluctant to drink, takes small sips independently. Enjoyed cookies and applesauce without difficulty. Resume regular diet and thin liquids. No SLP f/u needed SLP Visit Diagnosis: Dysphagia, unspecified (R13.10)    Aspiration Risk  Mild aspiration risk;Risk for inadequate nutrition/hydration    Diet Recommendation Regular;Thin liquid   Liquid Administration via: Cup;Straw Medication Administration: Whole meds with liquid Supervision: Patient able to self feed Compensations: Slow rate;Small sips/bites    Other  Recommendations      Recommendations for follow up therapy are one component of a multi-disciplinary discharge planning process, led by the attending physician.  Recommendations may be updated based on patient status, additional functional criteria and insurance authorization.  Follow up Recommendations None      Frequency and Duration            Prognosis        Swallow Study   General HPI: The patient is a 65 year old male presenting with worsening fatigue and lethargy and decreased PO intake. Found to have "acute on chronic hypoxic and hypercarbic respiratory failure, likely secondary to acute exacerbation of very severe COPD.  This is complicated by respiratory acidosis due to hypercarbic respiratory failure. " PMH is notable for COPD  (3-4L O2 at baseline), gastric adenocarcinoma s/p treatment and no known recurrence, and malnutrition. Type of Study: Bedside Swallow Evaluation Temperature Spikes Noted: No Respiratory Status: Nasal cannula History of Recent Intubation: No Behavior/Cognition: Alert;Cooperative;Pleasant mood Vision: Functional for self-feeding Self-Feeding Abilities: Able to feed self Patient Positioning: Upright in bed Baseline Vocal Quality: Normal Volitional Cough: Strong    Oral/Motor/Sensory Function Overall Oral Motor/Sensory Function: Within functional limits   Ice Chips     Thin Liquid Thin Liquid: Within functional limits    Nectar Thick Nectar Thick Liquid: Not tested   Honey Thick Honey Thick Liquid: Not tested   Puree Puree: Within functional limits   Solid     Solid: Within functional limits      Iyona Pehrson, Katherene Ponto 04/29/2021,2:29 PM

## 2021-04-29 NOTE — Plan of Care (Signed)

## 2021-04-29 NOTE — Progress Notes (Signed)
Initial Nutrition Assessment  DOCUMENTATION CODES:  Underweight, Severe malnutrition in context of chronic illness  INTERVENTION:  Monitor for diet advancement and order supplements as appropriate.  If pt unable to have diet advanced, recommend placement of small bore feeding tube/Cortrak to aid in meeting nutrition needs (provided this aligns with [place]). Consider: Osmolite 1.2 @ 21ml/hr, advance by 90ml/hr Q8H until goal rate of 10ml/hr (1280ml/d) is reached (provides 1440 kcals, 66 grams protein, 921ml free water)  Please note that once feeding is initiated, will need to monitor magnesium, potassium, and phosphorus daily for at least 3 days, MD to replete as needed, as pt is at risk for refeeding syndrome given prolonged inadequate po intake.  NUTRITION DIAGNOSIS:  Severe Malnutrition related to chronic illness (COPD; also h/o cancer) as evidenced by severe fat depletion, severe muscle depletion, percent weight loss.  GOAL:  Patient will meet greater than or equal to 90% of their needs  MONITOR:  Diet advancement, Labs, Weight trends, I & O's  REASON FOR ASSESSMENT:   Consult Assessment of nutrition requirement/status  ASSESSMENT:  Pt admitted with acute on chronic hypoxic and hypercarbic respiratory failure, likely secondary to acute exacerbation of very severe COPD.  This is complicated by respiratory acidosis due to hypercarbic respiratory failure. PMH includes COPD (3-4L O2 at baseline), gastric adenocarcinoma s/p treatment and no known recurrence, and malnutrition.  Per MD, pt's prognosis is very poor. Pt unable to provide meaningful history at time of RD visit as it appears pt does not understand questions being asked. Will attempt to obtain more history once mentation improves and/or able to get in contact with family. Per H&P, pt has been experiencing increased fatigue and lethargy in addition to SOB x2-3 weeks PTA. Pt also with decreased PO intake and decreased interest in  doing things. Per MD, evaluation for dementia in progress. PMT to meet with pt/family.   Reviewed weight history. Pt weighed 35.7 kg upon admission. Per wt readings, pt weighed 43.9 kg on 10/22/20. This indicates a clinically significant 18.7% wt loss x 6 months.   Pt is currently NPO and awaiting SLP evaluation. If pt unable to have diet advanced, recommend placement of small bore feeding tube/Cortrak to aid in meeting nutrition needs (provided this aligns with [place]).   No UOP documented x24 hours I/O: +1235ml since admit  Medications: solu-medrol, thiamine, IV abx, D5-1/2NS @ 4ml/hr, KCl 75mEq x2, IV potassium phosphate 55mmol x1 Labs: K+ 3.3 (L), PO4 2.2 (L)  NUTRITION - FOCUSED PHYSICAL EXAM: Flowsheet Row Most Recent Value  Orbital Region Severe depletion  Upper Arm Region Severe depletion  Thoracic and Lumbar Region Severe depletion  Buccal Region Severe depletion  Temple Region Severe depletion  Clavicle Bone Region Severe depletion  Clavicle and Acromion Bone Region Severe depletion  Scapular Bone Region Severe depletion  Dorsal Hand Severe depletion  Patellar Region Severe depletion  Anterior Thigh Region Severe depletion  Posterior Calf Region Severe depletion  Edema (RD Assessment) None  Hair Reviewed  Eyes Reviewed  Mouth Reviewed  Skin Reviewed  Nails Reviewed      Diet Order:   Diet Order             Diet NPO time specified  Diet effective ____                  EDUCATION NEEDS:  No education needs have been identified at this time  Skin:  Skin Assessment: Reviewed RN Assessment  Last BM:  10/20  Height:  Ht Readings from Last 1 Encounters:  04/28/21 5\' 5"  (1.651 m)   Weight:  Wt Readings from Last 10 Encounters:  04/28/21 35.7 kg  04/08/21 37.6 kg  03/24/21 37.6 kg  03/07/21 39.5 kg  03/03/21 39.9 kg  12/28/20 40.6 kg  11/09/20 44 kg  10/22/20 43.9 kg  08/12/20 41.6 kg  07/20/20 40.8 kg    BMI:  Body mass index is 13.1  kg/m.  Estimated Nutritional Needs:  Kcal:  1250-1450 Protein:  60-70 grams Fluid:  >1.2L/d    Larkin Ina, MS, RD, LDN (she/her/hers) RD pager number and weekend/on-call pager number located in [place].

## 2021-04-29 NOTE — Plan of Care (Signed)
Call received for routine pulmonary consultation for COPD management   PCCM is glad to consult--  Plan for pulmonologist to see tomorrow, 04/30/21-- d/w primary team.   Please re-page PCCM if clinical status changes and critical care consultation is needed in interim      Eliseo Gum MSN, AGACNP-BC [place] 04/29/2021, 6:24 PM

## 2021-04-29 NOTE — Evaluation (Signed)
Occupational Therapy Evaluation Patient Details Name: Bobby Caldwell MRN: [id number] DOB: 08-12-1955 Today's Date: 04/29/2021   History of Present Illness Pt is a 65 y/o male admitted on 04/27/21 with worsening fatigue, lethargy, and decreased PO intake. Admitted acute on chronic hypercapnic respiratory failure, AMS and malnutrition. PMH includes: COPD (3-4 L O2 at baseline), gastric adenocarcinoma s/p treatment and no known recurrence, and malnutrition.   Clinical Impression   PTA patient/spouse report limited mobility in home with assistance from spouse, ADLs with spouse assisting.  Admitted for above and presenting with decreased balance, activity tolerance and endurance.  Anticipate he is near baseline, as he completes bed mobility with supervision, transfers with min guard and up to max assist for ADLs.  Pt able to follow simple commands and communicate his needs to therapist. Spouse interpreting as needed, per preference.  Based on performance today, no further acute OT needs identified and OT will sign off.  If further needs arise, please re-consult.      Recommendations for follow up therapy are one component of a multi-disciplinary discharge planning process, led by the attending physician.  Recommendations may be updated based on patient status, additional functional criteria and insurance authorization.   Follow Up Recommendations  No OT follow up;Supervision/Assistance - 24 hour    Equipment Recommendations  None recommended by OT    Recommendations for Other Services       Precautions / Restrictions Precautions Precautions: Fall Precaution Comments: watch O2 Restrictions Weight Bearing Restrictions: No      Mobility Bed Mobility Overal bed mobility: Needs Assistance Bed Mobility: Supine to Sit;Sit to Supine     Supine to sit: Supervision Sit to supine: Supervision   General bed mobility comments: for lines    Transfers Overall transfer level: Needs assistance    Transfers: Sit to/from Stand Sit to Stand: Min guard         General transfer comment: min guard for safety and line mgmt    Balance Overall balance assessment: Mild deficits observed, not formally tested                                         ADL either performed or assessed with clinical judgement   ADL Overall ADL's : At baseline                                       General ADL Comments: pt recieves assist from spouse for ADLs and toileting as needed     Vision         Perception     Praxis      Pertinent Vitals/Pain Pain Assessment: Faces Faces Pain Scale: Hurts little more Pain Location: condom cath Pain Descriptors / Indicators: Discomfort Pain Intervention(s): Monitored during session     Hand Dominance Right   Extremity/Trunk Assessment Upper Extremity Assessment Upper Extremity Assessment: Generalized weakness   Lower Extremity Assessment Lower Extremity Assessment: Defer to PT evaluation       Communication Communication Communication: Prefers language other than English ([place]- spouse present and interprets)   Cognition Arousal/Alertness: Awake/alert Behavior During Therapy: Flat affect Overall Cognitive Status: Difficult to assess  General Comments: pt following simple commands and engaging appropriately to inform therapist of his needs, difficult to determine if this is different then baseline.   General Comments  pt on 3L [place], 100% at rest with desat into 80s with activity but poor pleth. SpO2 905 upon return to bed.     Exercises     Shoulder Instructions      Home Living Family/patient expects to be discharged to:: Private residence Living Arrangements: Spouse/significant other Available Help at Discharge: Family;Available PRN/intermittently Type of Home: House Home Access: Stairs to enter [employer] of Steps: 4 Entrance Stairs-Rails:  Right Home Layout: One level     Bathroom Shower/Tub: Chief Strategy Officer: Shower seat          Prior Functioning/Environment Level of Independence: Needs assistance  Gait / Transfers Assistance Needed: minimal ambulation, spouse assist ADL's / Homemaking Assistance Needed: spouse assists with bathing, dressing, toileting            OT Problem List: Decreased strength;Decreased activity tolerance;Cardiopulmonary status limiting activity      OT Treatment/Interventions:      OT Goals(Current goals can be found in the care plan section) Acute Rehab OT Goals Patient Stated Goal: home OT Goal Formulation: With patient  OT Frequency:     Barriers to D/C:            Co-evaluation PT/OT/SLP Co-Evaluation/Treatment: Yes     OT goals addressed during session: Other (comment);ADL's and self-care (activity tolerance)      AM-PAC OT "6 Clicks" Daily Activity     Outcome Measure Help from another person eating meals?: Total (NPO) Help from another person taking care of personal grooming?: A Little Help from another person toileting, which includes using toliet, bedpan, or urinal?: A Lot Help from another person bathing (including washing, rinsing, drying)?: A Lot Help from another person to put on and taking off regular upper body clothing?: A Little Help from another person to put on and taking off regular lower body clothing?: A Lot 6 Click Score: 13   End of Session Nurse Communication: Mobility status  Activity Tolerance: Patient tolerated treatment well Patient left: in bed;with call bell/phone within reach;with bed alarm set;with family/visitor present  OT Visit Diagnosis: Other abnormalities of gait and mobility (R26.89);Muscle weakness (generalized) (M62.81)                Time: 1655-3748 OT Time Calculation (min): 19 min Charges:  OT General Charges $OT Visit: 1 Visit OT Evaluation $OT Eval Moderate Complexity: 1 Mod  Bobby Caldwell,  OT Acute Rehabilitation Services Pager [phone] Office [phone]   Bobby Caldwell 04/29/2021, 10:41 AM

## 2021-04-29 NOTE — Progress Notes (Signed)
FPTS Brief Progress Note: Late Entry  S:Pt without concerns.    O: BP 126/85 (BP Location: Left Arm)   Pulse 87   Temp 98 F (36.7 C) (Axillary)   Resp 20   Ht 5\' 5"  (1.651 m)   Wt 35.7 kg   SpO2 100%   BMI 13.10 kg/m     A/P: To wear BiPAP overnight. Team aware.  - Orders reviewed. Labs for AM ordered, which was adjusted as needed.  - Wean to home O2   Lyndee Hensen, DO 04/29/2021, 5:39 AM PGY-3, [facility] Family [facility] Night Resident  Please page [phone] with questions.

## 2021-04-29 NOTE — Progress Notes (Signed)
FPTS Brief Progress Note  S: Pt resting in bed. RT by the bedside.    O: BP (!) 141/92 (BP Location: Left Arm)   Pulse 95   Temp 99.3 F (37.4 C) (Oral)   Resp 15   Ht 5\' 5"  (1.651 m)   Wt 35.7 kg   SpO2 100%   BMI 13.10 kg/m    RESP: wearing BiPAP   A/P: VSS  - Orders reviewed. Labs for AM ordered, which was adjusted as needed.    Lyndee Hensen, DO 04/29/2021, 11:38 PM PGY-3, [facility] Night Resident  Please page [phone] with questions.

## 2021-04-29 NOTE — Care Management Important Message (Signed)
Important Message  Patient Details  Name: Bobby Caldwell MRN: [id number] Date of Birth: 12-06-1955   Medicare Important Message Given:  Yes     Cristoval Teall Montine Circle 04/29/2021, 3:19 PM

## 2021-04-30 DIAGNOSIS — Z7189 Other specified counseling: Secondary | ICD-10-CM

## 2021-04-30 DIAGNOSIS — J9622 Acute and chronic respiratory failure with hypercapnia: Secondary | ICD-10-CM | POA: Diagnosis not present

## 2021-04-30 DIAGNOSIS — Z66 Do not resuscitate: Secondary | ICD-10-CM

## 2021-04-30 DIAGNOSIS — J449 Chronic obstructive pulmonary disease, unspecified: Secondary | ICD-10-CM

## 2021-04-30 LAB — PHOSPHORUS: Phosphorus: 3.2 mg/dL (ref 2.5–4.6)

## 2021-04-30 LAB — CBC WITH DIFFERENTIAL/PLATELET
Abs Immature Granulocytes: 0.01 10*3/uL (ref 0.00–0.07)
Basophils Absolute: 0 10*3/uL (ref 0.0–0.1)
Basophils Relative: 0 %
Eosinophils Absolute: 0 10*3/uL (ref 0.0–0.5)
Eosinophils Relative: 0 %
HCT: 31.5 % — ABNORMAL LOW (ref 39.0–52.0)
Hemoglobin: 10.7 g/dL — ABNORMAL LOW (ref 13.0–17.0)
Immature Granulocytes: 0 %
Lymphocytes Relative: 6 %
Lymphs Abs: 0.2 10*3/uL — ABNORMAL LOW (ref 0.7–4.0)
MCH: 31.4 pg (ref 26.0–34.0)
MCHC: 34 g/dL (ref 30.0–36.0)
MCV: 92.4 fL (ref 80.0–100.0)
Monocytes Absolute: 0.1 10*3/uL (ref 0.1–1.0)
Monocytes Relative: 2 %
Neutro Abs: 2.8 10*3/uL (ref 1.7–7.7)
Neutrophils Relative %: 92 %
Platelets: 149 10*3/uL — ABNORMAL LOW (ref 150–400)
RBC: 3.41 MIL/uL — ABNORMAL LOW (ref 4.22–5.81)
RDW: 13.6 % (ref 11.5–15.5)
WBC: 3 10*3/uL — ABNORMAL LOW (ref 4.0–10.5)
nRBC: 0 % (ref 0.0–0.2)

## 2021-04-30 LAB — CBC
HCT: 33.4 % — ABNORMAL LOW (ref 39.0–52.0)
Hemoglobin: 11.2 g/dL — ABNORMAL LOW (ref 13.0–17.0)
MCH: 31.2 pg (ref 26.0–34.0)
MCHC: 33.5 g/dL (ref 30.0–36.0)
MCV: 93 fL (ref 80.0–100.0)
Platelets: 150 10*3/uL (ref 150–400)
RBC: 3.59 MIL/uL — ABNORMAL LOW (ref 4.22–5.81)
RDW: 13.1 % (ref 11.5–15.5)
WBC: 1.3 10*3/uL — CL (ref 4.0–10.5)
nRBC: 0 % (ref 0.0–0.2)

## 2021-04-30 LAB — BASIC METABOLIC PANEL
Anion gap: 3 — ABNORMAL LOW (ref 5–15)
BUN: <5 mg/dL — ABNORMAL LOW (ref 8–23)
CO2: 40 mmol/L — ABNORMAL HIGH (ref 22–32)
Calcium: 8.3 mg/dL — ABNORMAL LOW (ref 8.9–10.3)
Chloride: 93 mmol/L — ABNORMAL LOW (ref 98–111)
Creatinine, Ser: 0.32 mg/dL — ABNORMAL LOW (ref 0.61–1.24)
GFR, Estimated: >60 mL/min (ref >60)
Glucose, Bld: 162 mg/dL — ABNORMAL HIGH (ref 70–99)
Potassium: 3.6 mmol/L (ref 3.5–5.1)
Sodium: 136 mmol/L (ref 135–145)

## 2021-04-30 LAB — MAGNESIUM: Magnesium: 1.7 mg/dL (ref 1.7–2.4)

## 2021-04-30 LAB — T3: T3, Total: 39 ng/dL — ABNORMAL LOW (ref 71–180)

## 2021-04-30 MED ORDER — ARFORMOTEROL TARTRATE 15 MCG/2ML IN NEBU
15.0000 ug | INHALATION_SOLUTION | Freq: Two times a day (BID) | RESPIRATORY_TRACT | Status: DC
  Administered 2021-04-30 – 2021-05-02 (×4): 15 ug via RESPIRATORY_TRACT
  Filled 2021-04-30 (×4): qty 2

## 2021-04-30 MED ORDER — ENSURE ENLIVE PO LIQD
237.0000 mL | Freq: Three times a day (TID) | ORAL | Status: DC
  Administered 2021-04-30 – 2021-05-01 (×4): 237 mL via ORAL

## 2021-04-30 MED ORDER — METHYLPREDNISOLONE SODIUM SUCC 125 MG IJ SOLR
120.0000 mg | Freq: Every day | INTRAMUSCULAR | Status: DC
  Administered 2021-04-30 – 2021-05-01 (×2): 120 mg via INTRAVENOUS
  Filled 2021-04-30 (×2): qty 2

## 2021-04-30 MED ORDER — BUDESONIDE 0.25 MG/2ML IN SUSP
0.2500 mg | Freq: Two times a day (BID) | RESPIRATORY_TRACT | Status: DC
  Administered 2021-04-30 – 2021-05-02 (×4): 0.25 mg via RESPIRATORY_TRACT
  Filled 2021-04-30 (×4): qty 2

## 2021-04-30 MED ORDER — MORPHINE SULFATE (CONCENTRATE) 10 MG/0.5ML PO SOLN: 5.0000 mg | ORAL | Status: DC | PRN

## 2021-04-30 MED ORDER — ENOXAPARIN SODIUM 40 MG/0.4ML IJ SOSY
40.0000 mg | PREFILLED_SYRINGE | INTRAMUSCULAR | Status: DC
  Administered 2021-04-30: 40 mg via SUBCUTANEOUS

## 2021-04-30 MED ORDER — ENOXAPARIN SODIUM 30 MG/0.3ML IJ SOSY
30.0000 mg | PREFILLED_SYRINGE | INTRAMUSCULAR | Status: DC
  Filled 2021-04-30: qty 0.3

## 2021-04-30 NOTE — Consult Note (Addendum)
Consultation Note Date: 04/30/2021   Patient Name: Bobby Caldwell  DOB: Jul 29, 1955  MRN: [id number]  Age / Sex: 65 y.o., male  PCP: [name], MD Referring Physician: Martyn Malay, MD  Reason for Consultation: Establishing goals of care  HPI/Patient Profile: 65 y.o. 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 Per notes there has been history of decline in interests, appetite, and intermittent confusion concerning for underlying dementia.   Clinical Assessment and Goals of Care: I met today at Mr. Kauffmann bedside along with wife, Rickard Patience, and daughter, Janit Pagan along with Dr. Owens Shark and Dr. Owens Shark. We had [place] tele-interpretor although audio only. There were some sections of the conversation when interpretor had difficult connection and interpreting was supplemented with Samire at bedside.   Long and difficult conversation today. Mr. Lunz is much more alert and oriented to be able to participate more fully in conversation and goals of care. He immediately identified his main goal to return to his home and also shares that he knows that his time is limited. I focused on conversation on these two points and how we can realistically support him returning home in light of his poor prognosis. We discussed in detail that hospice would be the best option and able to offer the most support for Mr. Minner and his family at home. Wife and daughter confirm that they are supportive of him returning home if they have support from hospice. We discussed goal of care to help Mr. Mollenkopf to feel as good as he can for as long as we can and allow him time at home to spend with his family. We discussed the philosophy of hospice in providing support and comfort when the medical team has done all they can to  improve his condition. We discussed that there is much we can do to offer comfort and relief of suffering even if we cannot cure and reverse his lung disease.   We also discussed plan for when he does worsen. We discussed code status and we were clear that doing CPR and breathing machines would only add to pain and suffering at end of life and we would not recommend to return to the hospital to go through these measures. Mr. Fernandez does not want these measures and wants to be at home surrounded by his family. Wife and daughter are supportive of DNR although this has been a difficult conclusion for them to come too.   Of note: His other daughter is in [place] and has just had a baby and not cleared to travel yet. We did clearly discuss prognosis as likely weeks to months. Will request hospice to also provide BiPAP for comfort as this is providing relief and allowing him to be more oriented and present with his family. Also plan to continue steroids with hopes of prolonging time for him to spend with his family and other daughter to visit if at  all possible.   All questions/concerns addressed. Emotional support provided. Updated TOC and hospice liaison to begin work on transition to home.   Primary Decision Maker PATIENT with wife and daughter support    SUMMARY OF RECOMMENDATIONS   - DNR decided - Home with hospice decided (very hopeful for tomorrow if at all possible for BiPAP to be delivered)  Code Status/Advance Care Planning: DNR   Symptom Management:  PRN Roxanol added for pain or shortness of breath - this will be managed by hospice at home.  Please discharge with prn medications for comfort such as low dose Xanax and Roxanol.  Consider senokot as needed for constipation with decreased activity/ambulation and potential for increased medication/opioid use.   Palliative Prophylaxis:  Aspiration, Bowel Regimen, Delirium Protocol, Frequent Pain Assessment, Oral Care, and Turn  Reposition  Psycho-social/Spiritual:  Desire for further Chaplaincy support:no Additional Recommendations: Education on Hospice and Grief/Bereavement Support  Prognosis:  Likely weeks.   Discharge Planning: Home with Hospice      Primary Diagnoses: Present on Admission:  Metabolic encephalopathy   I have reviewed the medical record, interviewed the patient and family, and examined the patient. The following aspects are pertinent.  Past Medical History:  Diagnosis Date   COPD (chronic obstructive pulmonary disease) ([place])    Dyspnea    PAD (peripheral artery disease) ([facility])    Pneumothorax    Stomach cancer ([facility])    Supplemental oxygen dependent    3.5-4 L   Tobacco abuse    Weight loss    Social History   Socioeconomic History   Marital status: Married    Spouse name: Not on file   Number of children: 4   Years of education: Not on file   Highest education level: Not on file  Occupational History   Not on file  Tobacco Use   Smoking status: Every Day    Packs/day: 1.50    Years: 50.00    Pack years: 75.00    Types: Cigarettes    Last attempt to quit: 11/2017    Years since quitting: 3.4   Smokeless tobacco: Never   Tobacco comments:    10-12 cigarettes/24 hours;using patch  Vaping Use   Vaping Use: Never used  Substance and Sexual Activity   Alcohol use: No    Alcohol/week: 0.0 standard drinks   Drug use: No   Sexual activity: Yes  Other Topics Concern   Not on file  Social History Narrative   Not on file   Social Determinants of Health   Financial Resource Strain: Not on file  Food Insecurity: Not on file  Transportation Needs: Not on file  Physical Activity: Not on file  Stress: Not on file  Social Connections: Not on file   Family History  Family history unknown: Yes   Scheduled Meds:  Chlorhexidine Gluconate Cloth  6 each Topical Daily   Glycerin (Adult)  1 suppository Rectal Once   heparin injection (subcutaneous)  5,000 Units  Subcutaneous Q8H   ipratropium-albuterol  3 mL Nebulization QID   methylPREDNISolone (SOLU-MEDROL) injection  120 mg Intravenous Daily   nicotine  21 mg Transdermal Q24H   polyethylene glycol  17 g Oral BID   sodium chloride flush  10-40 mL Intracatheter Q12H   thiamine injection  100 mg Intravenous Daily   Continuous Infusions:  azithromycin 500 mg (04/29/21 0949)   dextrose 5 % and 0.45% NaCl 10 mL/hr at 04/30/21 0605   PRN Meds:.albuterol, sodium chloride flush No Known Allergies  Review of Systems  Constitutional:  Positive for activity change, appetite change and fatigue.  Respiratory:  Positive for shortness of breath.   Neurological:  Positive for weakness.   Physical Exam Vitals and nursing note reviewed.  Constitutional:      General: He is not in acute distress.    Appearance: He is cachectic.  Cardiovascular:     Rate and Rhythm: Normal rate.  Pulmonary:     Effort: No tachypnea, accessory muscle usage or respiratory distress.     Comments: Able to converse with relative ease but ongoing shortness of breath at rest.  Abdominal:     General: Abdomen is flat.  Neurological:     Mental Status: He is alert.     Comments: Mostly oriented and very clear and appropriate to express his wishes and goals.     Vital Signs: BP 111/69 (BP Location: Left Arm)   Pulse 82   Temp 98.4 F (36.9 C) (Oral)   Resp 19   Ht 5' 5"  (1.651 m)   Wt 35.7 kg   SpO2 100%   BMI 13.10 kg/m  Pain Scale: 0-10 POSS *See Group Information*: 1-Acceptable,Awake and alert Pain Score: 0-No pain   SpO2: SpO2: 100 % O2 Device:SpO2: 100 % O2 Flow Rate: .O2 Flow Rate (L/min): 3 L/min  IO: Intake/output summary:  Intake/Output Summary (Last 24 hours) at 04/30/2021 0842 Last data filed at 04/30/2021 9629 Gross per 24 hour  Intake 3095.93 ml  Output 1825 ml  Net 1270.93 ml    LBM: Last BM Date: 04/28/21 Baseline Weight: Weight: 35.7 kg Most recent weight: Weight: 35.7 kg     Palliative  Assessment/Data:     Time In: 1500 Time Out: 1615 Time Total: 75 min Greater than 50%  of this time was spent counseling and coordinating care related to the above assessment and plan.  Signed by: Vinie Sill, NP Palliative Medicine Team Pager # [phone] (M-F 8a-5p) Team Phone # [phone] (Nights/Weekends)

## 2021-04-30 NOTE — Plan of Care (Signed)

## 2021-04-30 NOTE — Progress Notes (Signed)
Nutrition Consult Calorie Count   48 hour calorie count ordered. Diet advanced to Regular S/P swallow evaluation with SLP 10/21. RD following. See initial nutrition assessment in progress notes 10/21. Nutrition Dx: Severe Malnutrition related to chronic illness (COPD; also h/o cancer) as evidenced by severe fat depletion, severe muscle depletion, percent weight loss.  Intervention:  Calorie count over weekend; RD to follow-up with results Monday. Add Ensure Enlive po TID, each supplement provides 350 kcal and 20 grams of protein Add Magic cup TID with meals, each supplement provides 290 kcal and 9 grams of protein   Lucas Mallow, RD, LDN, CNSC Please refer to Amion for contact information.

## 2021-04-30 NOTE — Plan of Care (Signed)

## 2021-04-30 NOTE — Consult Note (Signed)
NAME:  Bobby Caldwell, MRN:  [id number], DOB:  1956-06-26, LOS: 3 ADMISSION DATE:  04/27/2021, CONSULTATION DATE:  04/30/21 REFERRING MD:  Dorris Singh, MD CHIEF COMPLAINT:  COPD   History of Present Illness:  65 year old male with COPD, chronic hypoxemic and hypercarbic respiratory failure admitted for COPD exacerbation. He is followed in our clinic at [facility] for his chronic lung issues. At baseline he uses scheduled Duonebs and supplemental oxygen 24/7. Was prescribed Bretriz but not using routinely. He reports that his shortness of breath has been worsening for a long time and he has worn home oxygen 24/7 for several months. In the last month he states he is unable to walk a few steps without feeling short of breath. Sometimes has nonproductive cough and wheeze. Denies chest pain. Prior to this admission he was reportedly more lethargic and sleeping more often. He was sent to the ED from his oncology appointment due to altered mental status and worsening shortness of breath.   CXR 04/28/21 with emphysema, no infiltrate. CT Chest/Abdomen/Pelvis 04/27/21 with diffuse centrilobular and paraseptal emphysema with upper lobe predominance and mild biapical pleural-parenchymal scarring. Unchanged pulmonary nodules. S/p distal gastrectomy.  In the room he is on 3L O2. Has tolerated BiPAP. Continues to have dyspnea with sitting up.  Pertinent  Medical History   Past Medical History:  Diagnosis Date   COPD (chronic obstructive pulmonary disease) (HCC)    Dyspnea    PAD (peripheral artery disease) (HCC)    Pneumothorax    Stomach cancer ([place])    Supplemental oxygen dependent    3.5-4 L   Tobacco abuse    Weight loss     Significant Hospital Events: Including procedures, antibiotic start and stop dates in addition to other pertinent events   10/19 Admitted to [facility] 10/21 PCCM consulted  Interim History / Subjective:  As above  Objective   Blood pressure 111/69, pulse 82, temperature  98.4 F (36.9 C), temperature source Oral, resp. rate 19, height 5\' 5"  (1.651 m), weight 35.7 kg, SpO2 100 %.    FiO2 (%):  [30 %] 30 %   Intake/Output Summary (Last 24 hours) at 04/30/2021 0939 Last data filed at 04/30/2021 5852 Gross per 24 hour  Intake 3095.93 ml  Output 1825 ml  Net 1270.93 ml   Filed Weights   04/28/21 1436  Weight: 35.7 kg   Physical Exam: General: Cachectic chronically ill-appearing, no acute distress HENT: [place], AT, OP clear, MMM Eyes: EOMI, no scleral icterus Respiratory: Diminished breath sounds bilaterally.  No crackles, wheezing or rales Cardiovascular: RRR, -M/R/G, no JVD Extremities:-Edema,-tenderness Neuro: AAO x4, CNII-XII grossly intact Skin: Intact, no rashes or bruising  Resolved Hospital Problem list     Assessment & Plan:   COPD exacerbation Acute on chronic respiratory failure with hypercarbia and hypoxemia on 3-4L at home Pulmonary cachexia  Chronically ill patient with very severe COPD (Ratio 32%, FEV1 23% in 2019), hx >80 pack-years and currently smoking 1/2 ppd. Unable to ambulate without significant dyspnea.   --Agree with Palliative consult. Address goals of care. Would be a very poor candidate for mechanical ventilation with his end-stage lung disease. We discussed considering hospice as he expressed not wanting to return to the hospital however would discuss this further with Palliative team and family present. --BiPAP PRN and nightly --Scheduled duonebs QID --Start Pulmicort and Brovana  --Continue azithromycin x 5 days --Continue steroids --ABG PRN for changes in mental status --Smoking cessation  Pulmonary will continue to follow  Best Practice (right click and "Reselect all SmartList Selections" daily)   Diet/type: NPO per primary team DVT prophylaxis: LMWH GI prophylaxis: N/A Lines: N/A Foley:  N/A Code Status:  full code Last date of multidisciplinary goals of care discussion []  Per primary  Labs    CBC: Recent Labs  Lab 04/27/21 1102 04/27/21 1112 04/28/21 0332 04/28/21 1328 04/29/21 0434 04/29/21 1138 04/30/21 0341  WBC 5.2  --  6.8  --  1.3* 2.6* 3.0*  NEUTROABS 3.8  --   --   --   --  2.2 2.8  HGB 10.8*   < > 11.7* 12.2* 11.2* 11.6* 10.7*  HCT 35.5*   < > 37.7* 36.0* 33.4* 34.6* 31.5*  MCV 101.7*  --  100.0  --  93.0 93.8 92.4  PLT 146*  --  142*  --  150 169 149*   < > = values in this interval not displayed.    Basic Metabolic Panel: Recent Labs  Lab 04/27/21 1102 04/27/21 1112 04/27/21 1643 04/28/21 0332 04/28/21 1328 04/29/21 0249 04/30/21 0341  NA 138   < > 135 133* 133* 135 136  K 4.1   < > 4.2 3.7 3.6 3.3* 3.6  CL 85*  --   --  85*  --  88* 93*  CO2 >45*  --   --  43*  --  40* 40*  GLUCOSE 90  --   --  147*  --  145* 162*  BUN 8  --   --  8  --  9 <5*  CREATININE 0.40*  --   --  0.41*  --  0.42* 0.32*  CALCIUM 8.2*  --   --  8.5*  --  8.7* 8.3*  MG 1.9  --   --   --   --  1.7 1.7  PHOS  --   --   --   --   --  2.2* 3.2   < > = values in this interval not displayed.   GFR: Estimated Creatinine Clearance: 47.1 mL/min (A) (by C-G formula based on SCr of 0.32 mg/dL (L)). Recent Labs  Lab 04/27/21 1102 04/28/21 0332 04/29/21 0434 04/29/21 1138 04/30/21 0341  WBC 5.2 6.8 1.3* 2.6* 3.0*  LATICACIDVEN 1.1  --   --   --   --     Liver Function Tests: Recent Labs  Lab 04/27/21 1102 04/29/21 0249  AST 14* 18  ALT 15 15  ALKPHOS 58 56  BILITOT 0.5 0.8  PROT 5.0* 5.6*  ALBUMIN 3.0* 3.2*   Recent Labs  Lab 04/27/21 1102  LIPASE 24   Recent Labs  Lab 04/27/21 1102  AMMONIA 22    ABG    Component Value Date/Time   PHART 7.485 (H) 04/28/2021 1328   PCO2ART 60.2 (H) 04/28/2021 1328   PO2ART 73 (L) 04/28/2021 1328   HCO3 45.5 (H) 04/28/2021 1328   TCO2 47 (H) 04/28/2021 1328   O2SAT 95.0 04/28/2021 1328     Coagulation Profile: No results for input(s): INR, PROTIME in the last 168 hours.  Cardiac Enzymes: No results for  input(s): CKTOTAL, CKMB, CKMBINDEX, TROPONINI in the last 168 hours.  HbA1C: Hemoglobin A1C  Date/Time Value Ref Range Status  03/19/2017 10:56 AM 5.5  Final    CBG: No results for input(s): GLUCAP in the last 168 hours.  Review of Systems:   Review of Systems  Constitutional:  Positive for malaise/fatigue and weight loss. Negative for chills, diaphoresis and fever.  HENT:  Negative for congestion, ear pain and sore throat.   Respiratory:  Positive for cough, shortness of breath and wheezing. Negative for hemoptysis and sputum production.   Cardiovascular:  Negative for chest pain, palpitations and leg swelling.  Gastrointestinal:  Negative for abdominal pain, heartburn and nausea.  Genitourinary:  Negative for frequency.  Musculoskeletal:  Negative for joint pain and myalgias.  Skin:  Negative for itching and rash.  Neurological:  Positive for weakness. Negative for dizziness and headaches.  Endo/Heme/Allergies:  Does not bruise/bleed easily.  Psychiatric/Behavioral:  Negative for depression. The patient is not nervous/anxious.     Past Medical History:  He,  has a past medical history of COPD (chronic obstructive pulmonary disease) ([place]), Dyspnea, PAD (peripheral artery disease) ([place]), Pneumothorax, Stomach cancer ([place]), Supplemental oxygen dependent, Tobacco abuse, and Weight loss.   Surgical History:   Past Surgical History:  Procedure Laterality Date   ABDOMINAL AORTOGRAM W/LOWER EXTREMITY N/A 05/03/2017   Procedure: ABDOMINAL AORTOGRAM W/LOWER EXTREMITY;  Surgeon: Conrad [place], MD;  Location: [place] [facility];  Service: Cardiovascular;  Laterality: N/A;   BIOPSY  08/22/2018   Procedure: BIOPSY;  Surgeon: [name], MD;  Location: [facility];  Service: Endoscopy;;   BIOPSY  07/31/2019   Procedure: BIOPSY;  Surgeon: [name], MD;  Location: [facility];  Service: Endoscopy;;   ESOPHAGOGASTRODUODENOSCOPY     05/24/17   ESOPHAGOGASTRODUODENOSCOPY (EGD)  WITH PROPOFOL N/A 05/24/2017   Procedure: ESOPHAGOGASTRODUODENOSCOPY (EGD) WITH PROPOFOL;  Surgeon: [name], MD;  Location: [facility];  Service: Endoscopy;  Laterality: N/A;   ESOPHAGOGASTRODUODENOSCOPY (EGD) WITH PROPOFOL N/A 08/22/2018   Procedure: ESOPHAGOGASTRODUODENOSCOPY (EGD) WITH PROPOFOL;  Surgeon: [name], MD;  [facility]: [facility];  Service: Endoscopy;  Laterality: N/A;   ESOPHAGOGASTRODUODENOSCOPY (EGD) WITH PROPOFOL N/A 11/28/2018   Procedure: ESOPHAGOGASTRODUODENOSCOPY (EGD) WITH PROPOFOL;  Surgeon: [name], MD;  [facility]: [facility];  Service: Endoscopy;  Laterality: N/A;   ESOPHAGOGASTRODUODENOSCOPY (EGD) WITH PROPOFOL N/A 07/31/2019   Procedure: ESOPHAGOGASTRODUODENOSCOPY (EGD) WITH PROPOFOL;  Surgeon: [name], MD;  Location: [facility];  Service: Endoscopy;  Laterality: N/A;   EUS N/A 06/07/2017   Procedure: UPPER ENDOSCOPIC ULTRASOUND (EUS) RADIAL;  Surgeon: [name], MD;  Location: [facility];  Service: Endoscopy;  Laterality: N/A;   FEMORAL-FEMORAL BYPASS GRAFT Bilateral 05/08/2017   Procedure: LEFT TO RIGHT BYPASS GRAFT FEMORAL-FEMORAL ARTERY;  Surgeon: Conrad [place], MD;  Location: [place];  Service: Vascular;  Laterality: Bilateral;   IR FLUORO GUIDE PORT INSERTION RIGHT  07/05/2017   IR US GUIDE VASC ACCESS RIGHT  07/05/2017   LAPAROSCOPIC INSERTION GASTROSTOMY TUBE N/A 11/27/2017   Procedure: JEJUNOSTOMY TUBE PLACEMENT;  Surgeon: [name], MD;  Location: [place];  Service: General;  Laterality: N/A;   LAPAROSCOPIC PARTIAL GASTRECTOMY N/A 11/27/2017   Procedure: PARTIAL GASTRECTOMY;  Surgeon: [name], MD;  Location: [place];  Service: General;  Laterality: N/A;   LAPAROSCOPY N/A 11/27/2017   Procedure: LAPAROSCOPY DIAGNOSTIC;  Surgeon: [name], MD;  Location: [place];  Service: General;  Laterality: N/A;   NM MYOVIEW LTD  06/2017   LOW RISK. Small basal inferoseptal defect thought to be related to artifact  (although cannot exclude prior infarct). Normal wall motion i in this area would suggest artifact not infarct. Otherwise no ischemia or infarction.   PERIPHERAL VASCULAR INTERVENTION Left 05/03/2017   Procedure: PERIPHERAL VASCULAR INTERVENTION;  Surgeon: Conrad , MD;  Location: [place] [facility];  Service: Cardiovascular;  Laterality: Left;  common iliac   POLYPECTOMY  11/28/2018   Procedure: POLYPECTOMY;  Surgeon: [name], MD;  [facility];  Service: Endoscopy;;   TRANSTHORACIC ECHOCARDIOGRAM  06/26/2017   Normal LV size and function. EF 60-65%. No wall motion abnormalities. GR 1 DD. Mild LA dilation     Social History:   reports that he has been smoking cigarettes. He has a 75.00 pack-year smoking history. He has never used smokeless tobacco. He reports that he does not drink alcohol and does not use drugs.   Family History:  His Family history is unknown by patient.   Allergies No Known Allergies   Home Medications  Prior to Admission medications   Medication Sig Start Date End Date Taking? Authorizing Provider  ipratropium-albuterol (DUONEB) 0.5-2.5 (3) MG/3ML SOLN Take 3 mLs by nebulization every 4 (four) hours as needed. Dx J44.9 03/07/21  Yes Olalere, Adewale A, MD  Budeson-Glycopyrrol-Formoterol (BREZTRI AEROSPHERE) 160-9-4.8 MCG/ACT AERO Inhale 2 puffs into the lungs 2 (two) times daily. Patient not taking: Reported on 04/28/2021 08/12/20   Laurin Coder, MD  feeding supplement, ENSURE ENLIVE, (ENSURE ENLIVE) LIQD Take 237 mLs by mouth 2 (two) times daily between meals. Patient not taking: Reported on 04/28/2021 09/13/19   Mariel Aloe, MD  megestrol (MEGACE ES) 625 MG/5ML suspension Take 5 mLs (625 mg total) by mouth daily. Patient not taking: Reported on 04/28/2021 03/03/21   Alla Feeling, NP  midodrine (PROAMATINE) 5 MG tablet Take 1 tablet (5 mg total) by mouth 3 (three) times daily with meals. Patient not taking: Reported on 04/28/2021 11/10/20    Shelly Coss, MD  Multiple Vitamin (MULTIVITAMIN WITH MINERALS) TABS tablet Take 1 tablet by mouth daily. Patient not taking: Reported on 04/28/2021 09/13/19   Mariel Aloe, MD  ondansetron (ZOFRAN) 4 MG tablet Take 1 tablet (4 mg total) by mouth every 8 (eight) hours as needed for nausea or vomiting. Patient not taking: Reported on 04/28/2021 03/23/20   [name], MD  OXYGEN Inhale 4 L into the lungs continuous.    [provider]  predniSONE (DELTASONE) 20 MG tablet Take 1 tablet (20 mg total) by mouth 2 (two) times daily. Patient not taking: Reported on 04/28/2021 04/08/21   Daleen Bo, MD     Critical care time: N/A    Rodman Pickle, M.D. [facility] Pulmonary/Critical Care Medicine 04/30/2021 9:39 AM   See Amion for personal pager For hours between 7 PM to 7 AM, please call Elink for urgent questions

## 2021-04-30 NOTE — Progress Notes (Signed)
Dr. Dorris Singh, attending, and myself were present today for goals of care conversation with Vinie Sill, NP with palliative medicine. Greatly appreciate Alicia's care and service for this patient.  Given very poor prognosis, patient and family agreed to change code status from full code to DNR/DNI. They also agreed to home hospice, and services were discussed at length. We placed order for DME home oxygen BiPAP.   All questions were answered to patient and family's satisfaction. We will work to discharge to home hospice on 10/23.  Orvis Brill, [place] Medicine Intern pager: [phone], text pages welcome

## 2021-04-30 NOTE — Progress Notes (Addendum)
Family Medicine Teaching Service Daily Progress Note Intern Pager: [phone]  Patient name: Bobby Caldwell Medical record number: [id number] Date of birth: 09/17/1955 Age: 65 y.o. Gender: male  Primary Care Provider: [name], MD Consultants: Palliative, Pulmonology Code Status: Full  Pt Overview and Major Events to Date:  10/19: Admitted FPTS   Assessment and Plan: Bobby Caldwell is a 65 year old male presenting with worsening fatigue and lethargy and decreased PO intake. PMH is notable for COPD (on 3-4L home O2), gastric adenocarcinoma s/p chemo and partial gastrectomy.    Acute on chronic hypercarbic respiratory failure: likely secondary to severe emphysema Stable.  Continues to use BiPAP overnight and able to tolerate O2 3L during day.  Respirations 18bpm.  Oxygen saturations 100% overnight.  Serum CO2 stable this am at 40. On exam he has prolonged expiratory phase.  CTAB, diminished at bases.  -PCCM has been consulted for optimization of end stage COPD, appreciate recommendations  -Continue BiPAP at night -Oxygen 2 L via n/c during day -Maintain oxygen saturations between 88-92% -Repeat ABG if worsening mentation -Continue Duonebs q4h -Continue Albuterol q2h prn -Solumedrol 120 mg IV daily -Zithromax 500 mg IV x 3 days (10/20-22) -Vitals per floor -Incentive spirometer q2h while awake -PT/OT out of bed daily  Altered mental status likely 2/2 hypercarbia Stable.  Improving.  Labs and imaging unremarkable.   -Continue to monitor -Continue BiPAP at night  Goals of care Family desires full code.  -Palliative care consulted -Family meeting planned for today.  Low TSH TSH low.  T4 wnl, low T3 -F/u outpatient   Malnutrition SLP evaluated, passed swallow.   -RD consulted for caloric needs -Conitnue regular diet, thin fluids -Monitor electrolytes daily -Monitor for refeeding syndrome -Continue Thiamine, Multivits -D/C IVF -Calorie count -Intake and output daily -Daily  weight -Turn and position while in bed q2h -OOB qshift -Bowel regime  Tobacco use disorder -nicotine patch   Leukopenia Improving.  Unclear etiology.  Suspect multifactorial -Repeat CBC in am  FEN/GI: Regular, Thin fluid.  When on BiPAP NPO PPx: Lovenox Dispo:Home pending clinical improvement .   Subjective:  No acute events overnight.  Awakens to speech.  Oriented to self. NO family at bedside.  Reports comfortable with nasal cannula and no worsening shortness of breath. Not much of an appetite.    Objective: Temp:  [97.6 F (36.4 C)-99.3 F (37.4 C)] 98.5 F (36.9 C) (10/22 0416) Pulse Rate:  [65-96] 71 (10/22 0416) Resp:  [15-20] 18 (10/22 0416) BP: (135-158)/(80-92) 135/80 (10/22 0005) SpO2:  [96 %-100 %] 100 % (10/22 0400) FiO2 (%):  [30 %] 30 % (10/22 0400) Physical Exam:  General: 65 y.o. fmale in NAD, appears cachetic and frail Cardio: RRR no m/r/g Lungs: CTAB, diminished at bases, prolonged expiratory phase, no wheezing, no rhonchi, no crackles, no IWOB on 3L N/C Abdomen: Soft, non-tender to palpation, non-distended, positive bowel sounds Skin: warm and dry Extremities: No edema   Laboratory: Recent Labs  Lab 04/29/21 0434 04/29/21 1138 04/30/21 0341  WBC 1.3* 2.6* 3.0*  HGB 11.2* 11.6* 10.7*  HCT 33.4* 34.6* 31.5*  PLT 150 169 149*   Recent Labs  Lab 04/27/21 1102 04/27/21 1112 04/28/21 0332 04/28/21 1328 04/29/21 0249 04/30/21 0341  NA 138   < > 133* 133* 135 136  K 4.1   < > 3.7 3.6 3.3* 3.6  CL 85*  --  85*  --  88* 93*  CO2 >45*  --  43*  --  40* 40*  BUN 8  --  8  --  9 <5*  CREATININE 0.40*  --  0.41*  --  0.42* 0.32*  CALCIUM 8.2*  --  8.5*  --  8.7* 8.3*  PROT 5.0*  --   --   --  5.6*  --   BILITOT 0.5  --   --   --  0.8  --   ALKPHOS 58  --   --   --  56  --   ALT 15  --   --   --  15  --   AST 14*  --   --   --  18  --   GLUCOSE 90  --  147*  --  145* 162*   < > = values in this interval not displayed.       Imaging/Diagnostic Tests: No results found.   Carollee Leitz, MD 04/30/2021, 5:46 AM PGY-3, [place] Intern pager: [phone], text pages welcome

## 2021-05-01 DIAGNOSIS — R1084 Generalized abdominal pain: Secondary | ICD-10-CM

## 2021-05-01 DIAGNOSIS — J9622 Acute and chronic respiratory failure with hypercapnia: Secondary | ICD-10-CM | POA: Diagnosis not present

## 2021-05-01 DIAGNOSIS — E43 Unspecified severe protein-calorie malnutrition: Secondary | ICD-10-CM

## 2021-05-01 DIAGNOSIS — C169 Malignant neoplasm of stomach, unspecified: Secondary | ICD-10-CM

## 2021-05-01 DIAGNOSIS — R0602 Shortness of breath: Secondary | ICD-10-CM

## 2021-05-01 DIAGNOSIS — G893 Neoplasm related pain (acute) (chronic): Secondary | ICD-10-CM

## 2021-05-01 DIAGNOSIS — R531 Weakness: Secondary | ICD-10-CM

## 2021-05-01 MED ORDER — PREDNISONE 20 MG PO TABS
40.0000 mg | ORAL_TABLET | Freq: Every day | ORAL | Status: DC
  Administered 2021-05-02: 40 mg via ORAL
  Filled 2021-05-01: qty 2

## 2021-05-01 MED ORDER — SENNA 8.6 MG PO TABS
1.0000 | ORAL_TABLET | Freq: Every day | ORAL | Status: DC
  Administered 2021-05-01 – 2021-05-02 (×2): 8.6 mg via ORAL
  Filled 2021-05-01 (×2): qty 1

## 2021-05-01 NOTE — Consult Note (Signed)
NAME:  Bobby Caldwell, MRN:  [id number], DOB:  Apr 28, 1956, LOS: 4 ADMISSION DATE:  04/27/2021, CONSULTATION DATE:  04/30/21 REFERRING MD:  Dorris Singh, MD CHIEF COMPLAINT:  COPD   History of Present Illness:  65 year old male with COPD, chronic hypoxemic and hypercarbic respiratory failure admitted for COPD exacerbation. He is followed in our clinic at [facility] for his chronic lung issues. At baseline he uses scheduled Duonebs and supplemental oxygen 24/7. Was prescribed Bretriz but not using routinely. He reports that his shortness of breath has been worsening for a long time and he has worn home oxygen 24/7 for several months. In the last month he states he is unable to walk a few steps without feeling short of breath. Sometimes has nonproductive cough and wheeze. Denies chest pain. Prior to this admission he was reportedly more lethargic and sleeping more often. He was sent to the ED from his oncology appointment due to altered mental status and worsening shortness of breath.   CXR 04/28/21 with emphysema, no infiltrate. CT Chest/Abdomen/Pelvis 04/27/21 with diffuse centrilobular and paraseptal emphysema with upper lobe predominance and mild biapical pleural-parenchymal scarring. Unchanged pulmonary nodules. S/p distal gastrectomy.  In the room he is on 3L O2. Has tolerated BiPAP. Continues to have dyspnea with sitting up.  Pertinent  Medical History   Past Medical History:  Diagnosis Date   COPD (chronic obstructive pulmonary disease) (HCC)    Dyspnea    PAD (peripheral artery disease) (HCC)    Pneumothorax    Stomach cancer ([place])    Supplemental oxygen dependent    3.5-4 L   Tobacco abuse    Weight loss     Significant Hospital Events: Including procedures, antibiotic start and stop dates in addition to other pertinent events   10/19 Admitted to [facility] 10/21 PCCM consulted 10/22 Code status changed to DNR  Interim History / Subjective:  Reports that he looking forward to  going home. He is still very short of breath with any activity  Objective   Blood pressure 130/77, pulse (!) 102, temperature 98.7 F (37.1 C), temperature source Oral, resp. rate 16, height 5\' 5"  (1.651 m), weight 35.7 kg, SpO2 95 %.        Intake/Output Summary (Last 24 hours) at 05/01/2021 1520 Last data filed at 04/30/2021 2000 Gross per 24 hour  Intake --  Output 526 ml  Net -526 ml    Filed Weights   04/28/21 1436  Weight: 35.7 kg    Physical Exam: General: Cachectic male-appearing, bilateral temporal wasting no acute distress HENT: [place], AT, OP clear, MMM Eyes: EOMI, no scleral icterus Respiratory: Diminished breath sounds bilaterally.  No crackles, wheezing or rales Cardiovascular: RRR, -M/R/G, no JVD Extremities:-Edema,-tenderness Neuro: AAO x4, CNII-XII grossly intact   Resolved Hospital Problem list     Assessment & Plan:   COPD exacerbation Acute on chronic respiratory failure with hypercarbia and hypoxemia on 3-4L at home Pulmonary cachexia  Chronically ill patient with very severe COPD (Ratio 32%, FEV1 23% in 2019), hx >80 pack-years and currently smoking 1/2 ppd. Unable to ambulate without significant dyspnea.  Would be a very poor candidate for mechanical ventilation with his end-stage lung disease. We discussed considering hospice as he expressed not wanting to return to the hospital however would discuss this further with Palliative team and family present.  Now DNR/DNI. Planned for hospice  While inpatient: --BiPAP PRN and nightly --Scheduled duonebs QID --Continue Pulmicort and Brovana  --Complete azithromycin x 5 days --Continue  steroids --Smoking cessation  Pulmonary will sign off  Best Practice (right click and "Reselect all SmartList Selections" daily)   Diet/type: NPO per primary team DVT prophylaxis: LMWH GI prophylaxis: N/A Lines: N/A Foley:  N/A Code Status:  full code Last date of multidisciplinary goals of care discussion []   Per primary  Labs   CBC: Recent Labs  Lab 04/27/21 1102 04/27/21 1112 04/28/21 0332 04/28/21 1328 04/29/21 0434 04/29/21 1138 04/30/21 0341  WBC 5.2  --  6.8  --  1.3* 2.6* 3.0*  NEUTROABS 3.8  --   --   --   --  2.2 2.8  HGB 10.8*   < > 11.7* 12.2* 11.2* 11.6* 10.7*  HCT 35.5*   < > 37.7* 36.0* 33.4* 34.6* 31.5*  MCV 101.7*  --  100.0  --  93.0 93.8 92.4  PLT 146*  --  142*  --  150 169 149*   < > = values in this interval not displayed.     Basic Metabolic Panel: Recent Labs  Lab 04/27/21 1102 04/27/21 1112 04/27/21 1643 04/28/21 0332 04/28/21 1328 04/29/21 0249 04/30/21 0341  NA 138   < > 135 133* 133* 135 136  K 4.1   < > 4.2 3.7 3.6 3.3* 3.6  CL 85*  --   --  85*  --  88* 93*  CO2 >45*  --   --  43*  --  40* 40*  GLUCOSE 90  --   --  147*  --  145* 162*  BUN 8  --   --  8  --  9 <5*  CREATININE 0.40*  --   --  0.41*  --  0.42* 0.32*  CALCIUM 8.2*  --   --  8.5*  --  8.7* 8.3*  MG 1.9  --   --   --   --  1.7 1.7  PHOS  --   --   --   --   --  2.2* 3.2   < > = values in this interval not displayed.    GFR: Estimated Creatinine Clearance: 47.1 mL/min (A) (by C-G formula based on SCr of 0.32 mg/dL (L)). Recent Labs  Lab 04/27/21 1102 04/28/21 0332 04/29/21 0434 04/29/21 1138 04/30/21 0341  WBC 5.2 6.8 1.3* 2.6* 3.0*  LATICACIDVEN 1.1  --   --   --   --      Liver Function Tests: Recent Labs  Lab 04/27/21 1102 04/29/21 0249  AST 14* 18  ALT 15 15  ALKPHOS 58 56  BILITOT 0.5 0.8  PROT 5.0* 5.6*  ALBUMIN 3.0* 3.2*    Recent Labs  Lab 04/27/21 1102  LIPASE 24    Recent Labs  Lab 04/27/21 1102  AMMONIA 22     ABG    Component Value Date/Time   PHART 7.485 (H) 04/28/2021 1328   PCO2ART 60.2 (H) 04/28/2021 1328   PO2ART 73 (L) 04/28/2021 1328   HCO3 45.5 (H) 04/28/2021 1328   TCO2 47 (H) 04/28/2021 1328   O2SAT 95.0 04/28/2021 1328      Coagulation Profile: No results for input(s): INR, PROTIME in the last 168  hours.  Cardiac Enzymes: No results for input(s): CKTOTAL, CKMB, CKMBINDEX, TROPONINI in the last 168 hours.  HbA1C: Hemoglobin A1C  Date/Time Value Ref Range Status  03/19/2017 10:56 AM 5.5  Final    CBG: No results for input(s): GLUCAP in the last 168 hours.  Critical care time: N/A    Care  Time: 25 min  Rodman Pickle, M.D. [facility] Pulmonary/Critical Care Medicine 05/01/2021 3:24 PM   See Amion for personal pager For hours between 7 PM to 7 AM, please call Elink for urgent questions

## 2021-05-01 NOTE — Progress Notes (Signed)
Manufacturing engineer [facility]) Hospital Liaison: RN note     Notified by Transition of Care Manger of patient/family request for [facility] services at home after discharge. Chart and patient has been reviewed by [facility] physician. Hospice eligibility confirmed.   Writer spoke with pt's daughter to continue education related to hospice philosophy, services and team approach to care.  Samire verbalized understanding of information given. Per discussion, plan is for discharge home via PTAR tomorrow.  Please send signed and completed DNR form home with patient/family.   Please provide prescriptions for mediations at discharge to ensure comfort until patient can be admitted onto hospice services.   DME needs have been discussed.  Patient currently has the following equipment in the home: hospital bed and O2.  Patient/family requests the following DME for delivery to the home: bipap, BSC, wheelchair.  This liaison will contact DME provider to arrange delivery to the home. Home address has been verified and is correct in the chart.     Janit Pagan is the family member to contact to arrange time of delivery.      [facility] aware of the above. Please notify [facility] when patient is ready to leave the unit at discharge. (Call [phone] or [phone] after 5pm.)  [facility] information and contact numbers given to  [facility].       Please do not hesitate to call with questions.   Thank you for the opportunity to participate in this patient's care.  Domenic Moras, BSN, RN       [place] (listed on AMION under [phone]  [phone] (24h on call)

## 2021-05-01 NOTE — Progress Notes (Signed)
Daily Progress Note   Patient Name: Bobby Caldwell       Date: 05/01/2021 DOB: 11-Jul-1955  Age: 65 y.o. MRN#: [id number] Attending Physician: Martyn Malay, MD Primary Care Physician: [name], MD Admit Date: 04/27/2021  Reason for Consultation/Follow-up: Establish GOCs and emotional support  Today discussion:  Conversation had at the bedside with patient and his wife (assisted by audio [place] interpretor) for conversation regarding current medical situation; diagnosis, prognosis, goals of care, end-of-life wishes, disposition and options.  Education offered on the difference between aggressive medical intervention path and a palliative comfort path for this patient at this time in this situation.  Both patient and his wife understand the seriousness of the current medical situation and the associated limited prognosis.  Emotional support offered  Education offered on hospice benefit both in the home and residential hospice.  Education offered on the natural trajectory and expectations at end-of-life  Length of Stay: 4  Current Medications: Scheduled Meds:   arformoterol  15 mcg Nebulization BID   budesonide (PULMICORT) nebulizer solution  0.25 mg Nebulization BID   Chlorhexidine Gluconate Cloth  6 each Topical Daily   feeding supplement  237 mL Oral TID BM   Glycerin (Adult)  1 suppository Rectal Once   ipratropium-albuterol  3 mL Nebulization QID   methylPREDNISolone (SOLU-MEDROL) injection  120 mg Intravenous Daily   nicotine  21 mg Transdermal Q24H   polyethylene glycol  17 g Oral BID   sodium chloride flush  10-40 mL Intracatheter Q12H   thiamine injection  100 mg Intravenous Daily    Continuous Infusions:   PRN Meds: albuterol, morphine CONCENTRATE, sodium chloride  flush  Physical Exam Constitutional:      Appearance: He is cachectic. He is ill-appearing.  Cardiovascular:     Rate and Rhythm: Normal rate.  Pulmonary:     Effort: Pulmonary effort is normal.  Skin:    General: Skin is warm and dry.  Neurological:     Mental Status: He is alert.            Vital Signs: BP 131/63 (BP Location: Left Arm)   Pulse 64   Temp 99.3 F (37.4 C) (Oral)   Resp 18   Ht 5\' 5"  (1.651 m)   Wt 35.7 kg   SpO2 97%   BMI 13.10  kg/m  SpO2: SpO2: 97 % O2 Device: O2 Device: Nasal Cannula O2 Flow Rate: O2 Flow Rate (L/min): 3 L/min  Intake/output summary:  Intake/Output Summary (Last 24 hours) at 05/01/2021 1009 Last data filed at 04/30/2021 2000 Gross per 24 hour  Intake --  Output 1026 ml  Net -1026 ml   LBM: Last BM Date: 04/30/21 Baseline Weight: Weight: 35.7 kg Most recent weight: Weight: 35.7 kg       Palliative Assessment/Data:  30% at best      Patient Active Problem List   Diagnosis Date Noted   Metabolic encephalopathy 25/36/6440   Syncope 11/09/2020   Weakness 11/18/2019   Medication management 10/22/2019   Healthcare maintenance 10/22/2019   Acute on chronic respiratory failure with hypoxia and hypercapnia (HCC) 10/12/2019   Hyponatremia 10/12/2019   History of COVID-19 10/12/2019   Acute respiratory failure with hypoxia ([place]) 09/11/2019   Protein-calorie malnutrition, severe 09/11/2019   Acute respiratory failure with hypoxia and hypercapnia ([place]) 09/10/2019   COPD with acute exacerbation ([place]) 09/10/2019   Gastric mass    Dysphagia    Chronic hypoxemic respiratory failure ([place]) 03/13/2018   S/P gastric surgery 01/22/2018   Nausea 12/20/2017   S/P vascular surgery 11/15/2017   Pneumonia 09/10/2017   Acute on chronic respiratory failure with hypercapnia (HCC) 09/10/2017   Exercise hypoxemia 09/04/2017   Moderate protein-calorie malnutrition ([place]) 07/24/2017   Port-A-Cath in place 07/06/2017   DOE (dyspnea on exertion)  06/12/2017   Atypical chest pain 06/12/2017   Essential hypertension 06/12/2017   Preop cardiovascular exam 06/12/2017   Gastric adenocarcinoma ([place]) 06/04/2017   Gastritis and gastroduodenitis    Atherosclerosis of native arteries of extremity with intermittent claudication ([place]) 05/03/2017   Critical lower limb ischemia ([place]) 05/03/2017   Abnormal CT of the chest 08/02/2015   Underweight 03/30/2015   COPD (chronic obstructive pulmonary disease) with emphysema ([place]) 02/16/2015   Weight loss 02/16/2015   Cigarette smoker 02/16/2015    Palliative Care Assessment & Plan   Assessment: 65 y.o. 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 Per notes there has been history of decline in interests, appetite, and intermittent confusion   Patient has made decision to focus on comfort and dignity allowing for natural death.  Recommendations/Plan: DNR/DNI No artificial feeding or hydration now or in the future Transition home with hospice services in place Symptom management to enhance comfort Prognosis is less than 6 months   Code Status:    Code Status Orders  (From admission, onward)           Start     Ordered   04/30/21 1605  Do not attempt resuscitation (DNR)  Continuous       Question Answer Comment  In the event of cardiac or respiratory ARREST Do not call a "code blue"   In the event of cardiac or respiratory ARREST Do not perform Intubation, CPR, defibrillation or ACLS   In the event of cardiac or respiratory ARREST Use medication by any route, position, wound care, and other measures to relive pain and suffering. May use oxygen, suction and manual treatment of airway obstruction as needed for comfort.      04/30/21 1604           Code Status History     Date Active Date Inactive Code Status Order ID Comments User Context    04/27/2021  1805 04/30/2021 1604 Full Code [id number]  Alcus Dad, MD ED   11/09/2020 1726 11/10/2020 2214 Full Code [id number]  Shelly Coss, MD ED   11/18/2019 1454 11/20/2019 2208 Full Code [id number]  Kayleen Memos, DO ED   10/12/2019 1607 10/15/2019 1846 Full Code [id number]  Norval Morton, MD ED   09/10/2019 2104 09/12/2019 2250 Full Code [id number]  Mariel Aloe, MD Inpatient   11/27/2017 1237 12/05/2017 1727 Full Code [id number]  [name], MD Inpatient   09/10/2017 2332 09/12/2017 1933 Full Code [id number]  Kerney Elbe, DO Inpatient   05/08/2017 1338 05/09/2017 1659 Full Code [id number]  Gabriel Earing, PA-C Inpatient   05/03/2017 1716 05/04/2017 2127 Full Code [id number]  Conrad Edmore, MD Inpatient   05/03/2017 1125 05/03/2017 1716 Full Code [id number]  Conrad [place], MD Inpatient       Discharge Planning: Home with Hospice  Care plan was discussed with Dr Owens Shark  Thank you for allowing the Palliative Medicine Team to assist in the care of this patient.   Time In: 0800 Time Out: 0900 Total Time 60 minutes Prolonged Time Billed  no       Greater than 50%  of this time was spent counseling and coordinating care related to the above assessment and plan.  Wadie Lessen, NP  Please contact Palliative Medicine Team phone at [phone] for questions and concerns.

## 2021-05-01 NOTE — Progress Notes (Signed)
FPTS Brief Progress Note  S: Patient and his wife sleeping soundly in the room.  Patient wearing CPAP.  I did not awaken them.  Nurse tech at bedside had no concerns.  She will let nurse know to call or page with any questions or concerns.   O: BP 117/67 (BP Location: Left Arm)   Pulse 87   Temp 99.3 F (37.4 C) (Oral)   Resp 13   Ht 5\' 5"  (1.651 m)   Wt 35.7 kg   SpO2 98%   BMI 13.10 kg/m     A/P: - Orders reviewed.  No morning labs schedule, as patient is currently palliative care.   Ezequiel Essex, MD 05/01/2021, 1:17 AM PGY-2, [place] [facility] Night Resident  Please page [phone] with questions.

## 2021-05-01 NOTE — TOC Initial Note (Addendum)
Transition of Care [facility]) - Initial/Assessment Note    Patient Details  Name: Bobby Caldwell MRN: [id number] Date of Birth: 12-Jan-1956  Transition of Care [facility]) CM/SW Contact:    Carles Collet, RN Phone Number: 05/01/2021, 9:56 AM  Clinical Narrative:       Patient meet with palliative care team late yesterday. Spoke w Vinie Sill NP, who states that family would like to bring patient home on hospice care as soon as possible, she provided choice and they would like [facility], patient will need BiPAP for home.  Referral placed to [facility] 10/22.  Spoke w Domenic Moras RN [facility] this morning to review plan for BiPAP and discharge. Chrislyn will work on getting BiPAP to the hospital today, patient will stay tonight to ensure proper fit and tolerance to home unit, and discharge Monday 10/24 to home with [facility] initiating services once he gets home.        Spoke w wife and daughter Bobby Caldwell. Both are in agreeance with plan for tomorrow. RT in room and will remind night shift for patient to wear home BiPAP throughout night.  Samjire states that he has a hospital bed and oxygen at home, she has requested WC from [facility] already, and she would like a 3/1 if the patient is not going to have a foley at home. Confirmed DC address listed [address] as address patient will DC to.          Expected Discharge Plan: Home w Hospice Care Barriers to Discharge: Continued Medical Work up, Equipment Delay   Patient Goals and CMS Choice Patient states their goals for this hospitalization and ongoing recovery are:: home with hospice CMS Medicare.gov Compare Post Acute Care list provided to:: Other (Comment Required) Choice offered to / list presented to : Adult Children  Expected Discharge Plan and Services Expected Discharge Plan: Home w Hospice Care   Discharge Planning Services: CM Consult Post Acute Care Choice: Hospice                   DME Arranged: Bipap DME Agency: Hospice and  Date DME Agency Contacted: 04/30/21   Representative spoke with at DME Agency: Janett Billow            Prior Living Arrangements/Services   Lives with:: Self                   Activities of Daily Living Home Assistive Devices/Equipment: None ADL Screening (condition at time of admission) Patient's cognitive ability adequate to safely complete daily activities?: Yes Is the patient deaf or have difficulty hearing?: No Does the patient have difficulty seeing, even when wearing glasses/contacts?: No Does the patient have difficulty concentrating, remembering, or making decisions?: Yes Patient able to express need for assistance with ADLs?: Yes Does the patient have difficulty dressing or bathing?: Yes Independently performs ADLs?: Yes (appropriate for developmental age) Does the patient have difficulty walking or climbing stairs?: Yes Weakness of Legs: Both Weakness of Arms/Hands: Both  Permission Sought/Granted                  Emotional Assessment              Admission diagnosis:  Shortness of breath [P29.51] Metabolic encephalopathy [O84.16] Generalized abdominal pain [R10.84] Gastric adenocarcinoma (HCC) [C16.9] Acute on chronic respiratory failure with hypercapnia ([place]) [J96.22] Patient Active Problem List   Diagnosis Date Noted   Metabolic encephalopathy 60/63/0160   Syncope 11/09/2020  Weakness 11/18/2019   Medication management 10/22/2019   Healthcare maintenance 10/22/2019   Acute on chronic respiratory failure with hypoxia and hypercapnia (HCC) 10/12/2019   Hyponatremia 10/12/2019   History of COVID-19 10/12/2019   Acute respiratory failure with hypoxia ([place]) 09/11/2019   Protein-calorie malnutrition, severe 09/11/2019   Acute respiratory failure with hypoxia and hypercapnia ([place]) 09/10/2019   COPD with acute exacerbation ([place]) 09/10/2019   Gastric mass    Dysphagia    Chronic hypoxemic respiratory failure ([place]) 03/13/2018    S/P gastric surgery 01/22/2018   Nausea 12/20/2017   S/P vascular surgery 11/15/2017   Pneumonia 09/10/2017   Acute on chronic respiratory failure with hypercapnia ([place]) 09/10/2017   Exercise hypoxemia 09/04/2017   Moderate protein-calorie malnutrition ([place]) 07/24/2017   Port-A-Cath in place 07/06/2017   DOE (dyspnea on exertion) 06/12/2017   Atypical chest pain 06/12/2017   Essential hypertension 06/12/2017   Preop cardiovascular exam 06/12/2017   Gastric adenocarcinoma ([place]) 06/04/2017   Gastritis and gastroduodenitis    Atherosclerosis of native arteries of extremity with intermittent claudication ([place]) 05/03/2017   Critical lower limb ischemia ([place]) 05/03/2017   Abnormal CT of the chest 08/02/2015   Underweight 03/30/2015   COPD (chronic obstructive pulmonary disease) with emphysema ([place]) 02/16/2015   Weight loss 02/16/2015   Cigarette smoker 02/16/2015   PCP:  [name], MD Pharmacy:   [facility], [address] [place] [address] Phone: [phone] Fax: [phone]     Social Determinants of Health (SDOH) Interventions    Readmission Risk Interventions Readmission Risk Prevention Plan 10/15/2019  Transportation Screening Complete  PCP or Specialist Appt within 3-5 Days Complete  HRI or [place] Complete  Social Work Consult for [place] Planning/Counseling Complete  Palliative Care Screening Not Applicable  Medication Review Press photographer) Complete  Some recent data might be hidden

## 2021-05-01 NOTE — Progress Notes (Signed)
Family Medicine Teaching Service Daily Progress Note Intern Pager: [phone]  Patient name: Bobby Caldwell Medical record number: [id number] Date of birth: 1956/05/23 Age: 65 y.o. Gender: male  Primary Care Provider: [name], MD Consultants: Palliative, pulmonology Code Status: DNR  Pt Overview and Major Events to Date:  04/27/2021-admitted  Assessment and Plan: Bobby Caldwell is a 65 year old male presented with worsening fatigue and lethargy and decreased p.o. intake.  Past medical history significant for COPD requiring 3/4 L home O2, gastric adenocarcinoma s/p chemo and partial gastrectomy.  Acute on chronic hypercarbic respiratory failure secondary to emphysema Patient continues to use BiPAP every night.  Is currently on 3 liters of O2 with oxygen saturations in the mid 90's.  On exam he is breathing comfortably.  Altered mental status is likely secondary to hypercarbia.  This has been improving, pt was able to answer questions appropriately. States he would like to go home as soon as possible.  -Palliative consulted -BiPAP at night oxygen via [place] during day -Maintain oxygen saturations between 88-92% -DuoNebs every 4 hours -Albuterol every 2 hours as needed -[place] nebulizer twice daily -Pulmicort twice daily -Solu-Medrol 120 mg IV daily for 2 days, started on 04/30/2021 -Start prednisone 40 mg daily for 5 days tomorrow -Morphine 5 mg sublingual every 2 hours as needed -Incentive spirometry every 2 hours while awake -PT/OT out of bed daily -Needs Home oxygen BiPAP, home with hospice tomorrow  Goals of care -Palliative has been consulted and saw patient yesterday.  Family and patient agreed to return home with hospice care.   Tobacco use disorder -Nicotine patch  -Home on PPI for gastric cancer   FEN/GI: Regular, when on BiPAP NPO PPx: none Dispo:Home tomorrow. Barriers include getting BiPAP for home.   Subjective:  Patient states he has no pain this morning, does not feel  short of breath.  Does not have an appetite.  Would like to go home soon as possible  Objective: Temp:  [98.4 F (36.9 C)-99.3 F (37.4 C)] 99.3 F (37.4 C) (10/22 2208) Pulse Rate:  [64-98] 64 (10/23 0418) Resp:  [12-20] 18 (10/23 0418) BP: (111-149)/(63-89) 131/63 (10/23 0418) SpO2:  [98 %-100 %] 99 % (10/23 0300) Physical Exam: General: Patient lying in bed, cachectic, NAD Cardiovascular: RRR Respiratory: CATB, normal work of breathing on 3 L via [place] Abdomen: Soft, nontender to palpation, non distended Extremities: No edema of BLEs  Laboratory: Recent Labs  Lab 04/29/21 0434 04/29/21 1138 04/30/21 0341  WBC 1.3* 2.6* 3.0*  HGB 11.2* 11.6* 10.7*  HCT 33.4* 34.6* 31.5*  PLT 150 169 149*   Recent Labs  Lab 04/27/21 1102 04/27/21 1112 04/28/21 0332 04/28/21 1328 04/29/21 0249 04/30/21 0341  NA 138   < > 133* 133* 135 136  K 4.1   < > 3.7 3.6 3.3* 3.6  CL 85*  --  85*  --  88* 93*  CO2 >45*  --  43*  --  40* 40*  BUN 8  --  8  --  9 <5*  CREATININE 0.40*  --  0.41*  --  0.42* 0.32*  CALCIUM 8.2*  --  8.5*  --  8.7* 8.3*  PROT 5.0*  --   --   --  5.6*  --   BILITOT 0.5  --   --   --  0.8  --   ALKPHOS 58  --   --   --  56  --   ALT 15  --   --   --  15  --   AST 14*  --   --   --  18  --   GLUCOSE 90  --  147*  --  145* 162*   < > = values in this interval not displayed.     Precious Gilding, DO 05/01/2021, 7:39 AM PGY-1, [place] Intern pager: [phone], text pages welcome

## 2021-05-02 DIAGNOSIS — Z515 Encounter for palliative care: Secondary | ICD-10-CM | POA: Diagnosis not present

## 2021-05-02 DIAGNOSIS — J9622 Acute and chronic respiratory failure with hypercapnia: Secondary | ICD-10-CM | POA: Diagnosis not present

## 2021-05-02 DIAGNOSIS — E43 Unspecified severe protein-calorie malnutrition: Secondary | ICD-10-CM | POA: Diagnosis not present

## 2021-05-02 DIAGNOSIS — Z85028 Personal history of other malignant neoplasm of stomach: Secondary | ICD-10-CM | POA: Diagnosis not present

## 2021-05-02 MED ORDER — IPRATROPIUM-ALBUTEROL 0.5-2.5 (3) MG/3ML IN SOLN
3.0000 mL | Freq: Two times a day (BID) | RESPIRATORY_TRACT | Status: DC
  Administered 2021-05-02: 3 mL via RESPIRATORY_TRACT
  Filled 2021-05-02: qty 3

## 2021-05-02 MED ORDER — POLYETHYLENE GLYCOL 3350 17 G PO PACK: 17.0000 g | PACK | Freq: Two times a day (BID) | ORAL | 0 refills | Status: DC

## 2021-05-02 MED ORDER — SENNA 8.6 MG PO TABS: 1.0000 | ORAL_TABLET | Freq: Every day | ORAL | 0 refills | Status: DC

## 2021-05-02 MED ORDER — ARFORMOTEROL TARTRATE 15 MCG/2ML IN NEBU: 15.0000 ug | INHALATION_SOLUTION | Freq: Two times a day (BID) | RESPIRATORY_TRACT | 0 refills | Status: DC

## 2021-05-02 MED ORDER — LORAZEPAM 2 MG/ML IJ SOLN
0.5000 mg | Freq: Once | INTRAMUSCULAR | Status: AC
  Administered 2021-05-02: 0.5 mg via INTRAVENOUS
  Filled 2021-05-02: qty 1

## 2021-05-02 MED ORDER — MORPHINE SULFATE 10 MG/5ML PO SOLN: 5.0000 mg | ORAL | 0 refills | Status: DC | PRN

## 2021-05-02 MED ORDER — HEPARIN SOD (PORK) LOCK FLUSH 100 UNIT/ML IV SOLN
500.0000 [IU] | Freq: Once | INTRAVENOUS | Status: DC
  Filled 2021-05-02 (×2): qty 5

## 2021-05-02 MED ORDER — PREDNISONE 20 MG PO TABS: 40.0000 mg | ORAL_TABLET | Freq: Every day | ORAL | 0 refills | Status: AC

## 2021-05-02 MED ORDER — ALPRAZOLAM 0.5 MG PO TABS: 0.5000 mg | ORAL_TABLET | Freq: Three times a day (TID) | ORAL | 0 refills | Status: AC | PRN

## 2021-05-02 MED ORDER — ALBUTEROL SULFATE (2.5 MG/3ML) 0.083% IN NEBU: 2.5000 mg | INHALATION_SOLUTION | RESPIRATORY_TRACT | 12 refills | Status: DC | PRN

## 2021-05-02 MED ORDER — BUDESONIDE 0.25 MG/2ML IN SUSP: 0.2500 mg | Freq: Two times a day (BID) | RESPIRATORY_TRACT | 12 refills | Status: DC

## 2021-05-02 MED ORDER — MORPHINE SULFATE (CONCENTRATE) 10 MG /0.5 ML PO SOLN: 5.0000 mg | ORAL | 0 refills | Status: DC | PRN

## 2021-05-02 NOTE — Progress Notes (Signed)
FPTS Brief Progress Note  S:Pt sleeping    O: BP 130/77 (BP Location: Left Arm)   Pulse (!) 102   Temp 98.7 F (37.1 C) (Oral)   Resp 18   Ht 5\' 5"  (1.651 m)   Wt 35.7 kg   SpO2 96%   BMI 13.10 kg/m    RESP: wearing BiPAP   A/P:  - Orders reviewed. Labs for AM not ordered, which was adjusted as needed.    Lyndee Hensen, DO 05/02/2021, 1:29 AM PGY-3, [place] Family [facility] Night Resident  Please page [phone] with questions.

## 2021-05-02 NOTE — Plan of Care (Signed)
  Problem: Education: Goal: Knowledge of General Education information will improve Description: Including pain rating scale, medication(s)/side effects and non-pharmacologic comfort measures Outcome: Progressing   Problem: Activity: Goal: Risk for activity intolerance will decrease Outcome: Progressing   Problem: Coping: Goal: Level of anxiety will decrease Outcome: Progressing   Problem: Elimination: Goal: Will not experience complications related to bowel motility Outcome: Progressing   

## 2021-05-02 NOTE — Progress Notes (Signed)
Family Medicine Teaching Service Daily Progress Note Intern Pager: [phone]  Patient name: Bobby Caldwell Medical record number: [id number] Date of birth: 1956-06-19 Age: 65 y.o. Gender: male  Primary Care Provider: [name], MD Consultants: Palliative Code Status: DNR  Pt Overview and Major Events to Date:  04/27/2021-admitted  Assessment and Plan: Bobby Caldwell is a 65 year old male who presented with worsening fatigue lethargy and decreased p.o. intake.  PMH is significant for COPD requiring 3/4 L home oxygen, gastric adenocarcinoma s/p chemo and partial gastrectomy  Acute on chronic hypercarbic respiratory failure secondary to emphysema Patient continues to use BiPAP nightly.  Is currently on 3 L of O2.  On exam he is breathing comfortably.  He will be going home with hospice today. -Palliative has been consulted, -BiPAP at night and oxygen via [place] during day -DuoNebs every 4 hours Albuterol every 2 hours as needed -Recommended nebulizer twice daily -Pulmicort twice daily -Start prednisone 40 mg daily for 5 days today -Morphine 5 mg sublingual every 2 hours as needed -Incentive spirometry every 2 hours while awake -PT/OT out of bed daily -xanax 0.5 TID PRN for anxiety   Goals of care -Patient would like to go home with home hospice as soon as possible.  Goal getting patient home today.  Tobacco use disorder -Nicotine patch   FEN/GI: Regular diet, when on BiPAP n.p.o. PPx: None Dispo:Home today with hospice  Subjective:  Patient states pain is feeling very anxious and shaky today.  He would like an anxiolytic.  Denies any shortness of breath this morning.  Denies any pain.  Objective: Temp:  [98.3 F (36.8 C)-98.7 F (37.1 C)] 98.3 F (36.8 C) (10/24 0755) Pulse Rate:  [72-102] 72 (10/24 0829) Resp:  [16-19] 18 (10/24 0829) BP: (97-130)/(65-79) 97/65 (10/24 0755) SpO2:  [95 %-99 %] 98 % (10/24 0829) Physical Exam: General: 65 year old male, cachectic, lying in bed,  NAD Cardiovascular: Well perfused, capillary refill < 2 secs Respiratory: Breathing comfortably on 3 L via [place] Abdomen: Soft, nontender to palpation Extremities: Bilateral hands with tremor  Laboratory: Recent Labs  Lab 04/29/21 0434 04/29/21 1138 04/30/21 0341  WBC 1.3* 2.6* 3.0*  HGB 11.2* 11.6* 10.7*  HCT 33.4* 34.6* 31.5*  PLT 150 169 149*   Recent Labs  Lab 04/27/21 1102 04/27/21 1112 04/28/21 0332 04/28/21 1328 04/29/21 0249 04/30/21 0341  NA 138   < > 133* 133* 135 136  K 4.1   < > 3.7 3.6 3.3* 3.6  CL 85*  --  85*  --  88* 93*  CO2 >45*  --  43*  --  40* 40*  BUN 8  --  8  --  9 <5*  CREATININE 0.40*  --  0.41*  --  0.42* 0.32*  CALCIUM 8.2*  --  8.5*  --  8.7* 8.3*  PROT 5.0*  --   --   --  5.6*  --   BILITOT 0.5  --   --   --  0.8  --   ALKPHOS 58  --   --   --  56  --   ALT 15  --   --   --  15  --   AST 14*  --   --   --  18  --   GLUCOSE 90  --  147*  --  145* 162*   < > = values in this interval not displayed.      Precious Gilding, DO 05/02/2021, 9:01 AM PGY-1, [place]  [place] Intern pager: [phone], text pages welcome

## 2021-05-02 NOTE — Progress Notes (Signed)
Discharge paperwork complete, patient is going home on hospice and waiting for transport.

## 2021-05-02 NOTE — Discharge Summary (Addendum)
[facility] Discharge Summary  Patient name: Bobby Caldwell Medical record number: [id number] Date of birth: [birth year] Age: 65 y.o. Gender: male Date of Admission: 04/27/2021 Date of Discharge: 05/02/21 Admitting Physician: Lind Covert, MD  Primary Care Provider: [name], MD Consultants: Palliative  Indication for Hospitalization: Worsening fatigue, decreased p.o. intake, hypoxemia  Discharge Diagnoses/Problem List:  Active Problems:   Acute on chronic respiratory failure with hypercapnia ([place])   Metabolic encephalopathy   Hospice care patient  Disposition: Home with hospice care  Discharge Condition: Stable  Discharge Exam:  General: 65 year old male, cachectic, lying in bed, NAD Cardiovascular: Well perfused, capillary refill < 2 secs Respiratory: Breathing comfortably on 3 L via  Abdomen: Soft, nontender to palpation Extremities: Bilateral hands with tremor  Brief Hospital Course:  Bobby Caldwell is a 65 year old male presenting with worsening fatigue and lethargy and decreased PO intake. PMH is notable for COPD (3-4L O2 at baseline), gastric adenocarcinoma s/p treatment and no known recurrence, and malnutrition. Hospital course is outlined below:  Acute on chronic hypercapnic respiratory failure Family reported increased lethargy and confusion, as well as dyspnea for the past several days. Admit VBG of 7.3/106/64/55, indicating significant hypercapnia with chronic compensation with increased bicarb. CXR was unremarkable aside from hyperinflated lungs. Per chart review, patient's FEV1/FVC was 46% several years ago. It was thought that his presentation was a COPD exacerbation. He was started on BiPAP and was weaned successfully to nasal cannula. He required BiPAP nightly and was treated with DuoNebs, albuterol, Brovana, Pulmicort, and steroids.  After patient decided to go home with hospice care, morphine prn was added onto his regimen.  Patient  was discharged with DuoNebs every 4 hours, albuterol nebulizer every 2 hours as needed, Pulmicort twice daily, Brovana nebulizer twice daily, prednisone 40 mg daily for 5 days, and morphine 5mg  sublingual every 2 hours as needed.  Altered mental status Thought to be due to acute on chronic hypercarbia. However, family reports a long history of steadily declining mentation. Per recent baseline, patient is only able to feed himself and is dependent on family for other ADLs. Workup included negative CTH, UA and CRX unremarkable, ethanol <10, ammonia WNL, HIV and RPR negative.    Malnutrition Patient is clearly cachectic.  RD was consulted and did a 48-hour calorie count, encouraged Ensure 3 times daily between meals and Magic cup 3 times daily with meals.  Patient was also given thiamine and  a multivitamin daily.  Goals of care Family initially desired full code after repeated discussions with the primary team. Palliative care was consulted and ultimately family decided to make patient DNR and to have patient go home with hospice care.  Upon discharge, Xanax 0.5 mg 3 times daily as needed was added onto his regimen for anxiety.     Significant Labs and Imaging:  Recent Labs  Lab 04/29/21 0434 04/29/21 1138 04/30/21 0341  WBC 1.3* 2.6* 3.0*  HGB 11.2* 11.6* 10.7*  HCT 33.4* 34.6* 31.5*  PLT 150 169 149*   Recent Labs  Lab 04/27/21 1102 04/27/21 1112 04/27/21 1643 04/28/21 0332 04/28/21 1328 04/29/21 0249 04/30/21 0341  NA 138   < > 135 133* 133* 135 136  K 4.1   < > 4.2 3.7 3.6 3.3* 3.6  CL 85*  --   --  85*  --  88* 93*  CO2 >45*  --   --  43*  --  40* 40*  GLUCOSE 90  --   --  147*  --  145* 162*  BUN 8  --   --  8  --  9 <5*  CREATININE 0.40*  --   --  0.41*  --  0.42* 0.32*  CALCIUM 8.2*  --   --  8.5*  --  8.7* 8.3*  MG 1.9  --   --   --   --  1.7 1.7  PHOS  --   --   --   --   --  2.2* 3.2  ALKPHOS 58  --   --   --   --  56  --   AST 14*  --   --   --   --  18  --    ALT 15  --   --   --   --  15  --   ALBUMIN 3.0*  --   --   --   --  3.2*  --    < > = values in this interval not displayed.     Discharge Medications:  Allergies as of 05/02/2021   No Known Allergies      Medication List     STOP taking these medications    Breztri Aerosphere 160-9-4.8 MCG/ACT Aero Generic drug: Budeson-Glycopyrrol-Formoterol   feeding supplement Liqd   megestrol 625 MG/5ML suspension Commonly known as: MEGACE ES   midodrine 5 MG tablet Commonly known as: PROAMATINE   multivitamin with minerals Tabs tablet   ondansetron 4 MG tablet Commonly known as: Zofran       TAKE these medications    albuterol (2.5 MG/3ML) 0.083% nebulizer solution Commonly known as: PROVENTIL Take 3 mLs (2.5 mg total) by nebulization every 2 (two) hours as needed for wheezing or shortness of breath.   ALPRAZolam 0.5 MG tablet Commonly known as: Xanax Take 1 tablet (0.5 mg total) by mouth 3 (three) times daily as needed for sleep or anxiety.   arformoterol 15 MCG/2ML Nebu Commonly known as: BROVANA Take 2 mLs (15 mcg total) by nebulization 2 (two) times daily.   budesonide 0.25 MG/2ML nebulizer solution Commonly known as: PULMICORT Take 2 mLs (0.25 mg total) by nebulization 2 (two) times daily.   ipratropium-albuterol 0.5-2.5 (3) MG/3ML Soln Commonly known as: DUONEB Take 3 mLs by nebulization every 4 (four) hours as needed. Dx J44.9   morphine CONCENTRATE 10 mg / 0.5 ml concentrated solution Take 0.25 mLs (5 mg total) by mouth every 2 (two) hours as needed for severe pain.   OXYGEN Inhale 4 L into the lungs continuous.   polyethylene glycol 17 g packet Commonly known as: MIRALAX / GLYCOLAX Take 17 g by mouth 2 (two) times daily.   predniSONE 20 MG tablet Commonly known as: DELTASONE Take 2 tablets (40 mg total) by mouth daily with breakfast for 5 days. What changed:  how much to take when to take this   senna 8.6 MG Tabs tablet Commonly known as:  SENOKOT Take 1 tablet (8.6 mg total) by mouth daily.               Durable Medical Equipment  (From admission, onward)           Start     Ordered   04/30/21 1606  For home use only DME Bipap  Once       Question Answer Comment  Length of Need 6 Months   Bleed in oxygen (LPM) 3   Keep 02 saturation 88-92%   Inspiratory pressure 12  Expiratory pressure 5      04/30/21 1607            Discharge Instructions: Please refer to Patient Instructions section of EMR for full details.  Patient was counseled important signs and symptoms that should prompt return to medical care, changes in medications, dietary instructions, activity restrictions, and follow up appointments.   Follow-Up Appointments:   Precious Gilding, DO 05/02/2021, 3:45 PM PGY-1, [place] Upper-Level Resident Addendum   I have independently interviewed and examined the patient. I have discussed the above with the original author and agree with their documentation.  Please see also any attending notes.    Carollee Leitz MD PGY-3, [facility] 05/03/2021 7:51 AM  FPTS Service pager: [phone] (text pages welcome through [facility])

## 2021-05-02 NOTE — Progress Notes (Signed)
Nutrition Brief Note  RD went to follow-up on calorie count. No meal documentation available. No envelope with meal tickets had been placed. Note pt is to discharge home with hospice today so will d/c order for calorie count. May continue Ensure and Magic Cup supplementation. Food should be a source of comfort. No further nutrition interventions planned at this time.  Please re-consult as needed.   Larkin Ina, MS, RD, LDN (she/her/hers) RD pager number and weekend/on-call pager number located in [place].

## 2021-05-02 NOTE — Plan of Care (Signed)
  Problem: Education: Goal: Knowledge of General Education information will improve Description: Including pain rating scale, medication(s)/side effects and non-pharmacologic comfort measures Outcome: Adequate for Discharge   Problem: Health Behavior/Discharge Planning: Goal: Ability to manage health-related needs will improve Outcome: Adequate for Discharge   Problem: Clinical Measurements: Goal: Ability to maintain clinical measurements within normal limits will improve Outcome: Adequate for Discharge Goal: Will remain free from infection Outcome: Adequate for Discharge Goal: Diagnostic test results will improve Outcome: Adequate for Discharge Goal: Respiratory complications will improve Outcome: Adequate for Discharge Goal: Cardiovascular complication will be avoided Outcome: Adequate for Discharge   Problem: Activity: Goal: Risk for activity intolerance will decrease Outcome: Adequate for Discharge   Problem: Nutrition: Goal: Adequate nutrition will be maintained Outcome: Adequate for Discharge   Problem: Coping: Goal: Level of anxiety will decrease Outcome: Adequate for Discharge   Problem: Elimination: Goal: Will not experience complications related to bowel motility Outcome: Adequate for Discharge Goal: Will not experience complications related to urinary retention Outcome: Adequate for Discharge   Problem: Pain Managment: Goal: General experience of comfort will improve Outcome: Adequate for Discharge   Problem: Safety: Goal: Ability to remain free from injury will improve Outcome: Adequate for Discharge   Problem: Skin Integrity: Goal: Risk for impaired skin integrity will decrease Outcome: Adequate for Discharge   Problem: Malnutrition  (NI-5.2) Goal: Food and/or nutrient delivery Description: Individualized approach for food/nutrient provision. Outcome: Adequate for Discharge   

## 2021-05-02 NOTE — Progress Notes (Signed)
Pt requesting to come off Bipap at this time. Pt placed back on 3LNC.

## 2021-05-02 NOTE — TOC Transition Note (Signed)
Transition of Care [facility]) - CM/SW Discharge Note   Patient Details  Name: Bobby Caldwell MRN: [id number] Date of Birth: [birth year]  [facility] CM/SW Contact:  Angelita Ingles, RN Phone Number:[phone]  05/02/2021, 1:22 PM   Clinical Narrative:    [facility] notified for transport home. Address has been verified with daughter Bobby Caldwell. ETA 1-1.5 hours. Daughter updated.     Barriers to Discharge: Continued Medical Work up, Equipment Delay   Patient Goals and CMS Choice Patient states their goals for this hospitalization and ongoing recovery are:: home with hospice CMS Medicare.gov Compare Post Acute Care list provided to:: Other (Comment Required) Choice offered to / list presented to : Adult Children  Discharge Placement                       Discharge Plan and Services   Discharge Planning Services: CM Consult Post Acute Care Choice: Hospice          DME Arranged: Bipap DME Agency: [facility] [place] Date DME Agency Contacted: 04/30/21   Representative spoke with at DME Agency: [facility] Determinants of Health (SDOH) Interventions     Readmission Risk Interventions Readmission Risk Prevention Plan 10/15/2019  Transportation Screening Complete  PCP or Specialist Appt within 3-5 Days Complete  HRI or [place] Complete  Social Work Consult for Portal Planning/Counseling Complete  Palliative Care Screening Not Applicable  Medication Review [employer]) Complete  Some recent data might be hidden

## 2021-05-02 NOTE — Final Progress Note (Signed)
[facility] at bedside to take pt home. No needs at this time. Ativan IV given. Port de-accessed w/o complications. Condom cath in place per pt request.

## 2021-05-26 ENCOUNTER — Inpatient Hospital Stay: Payer: Medicare Other | Admitting: Hematology

## 2021-05-26 ENCOUNTER — Inpatient Hospital Stay: Payer: Medicare Other

## 2021-05-26 ENCOUNTER — Encounter: Payer: Self-pay | Admitting: Hematology

## 2021-06-09 DEATH — deceased
